# Patient Record
Sex: Male | Born: 1940
Health system: Southern US, Community
[De-identification: ages and names within clinical notes are randomized; demographics above are authoritative.]

## PROBLEM LIST (undated history)

## (undated) DIAGNOSIS — C61 Malignant neoplasm of prostate: Secondary | ICD-10-CM

## (undated) DIAGNOSIS — T4145XA Adverse effect of unspecified anesthetic, initial encounter: Secondary | ICD-10-CM

## (undated) DIAGNOSIS — T8859XA Other complications of anesthesia, initial encounter: Secondary | ICD-10-CM

## (undated) DIAGNOSIS — I4891 Unspecified atrial fibrillation: Secondary | ICD-10-CM

## (undated) DIAGNOSIS — M1990 Unspecified osteoarthritis, unspecified site: Secondary | ICD-10-CM

## (undated) DIAGNOSIS — Z8719 Personal history of other diseases of the digestive system: Secondary | ICD-10-CM

## (undated) DIAGNOSIS — H9190 Unspecified hearing loss, unspecified ear: Secondary | ICD-10-CM

## (undated) DIAGNOSIS — F329 Major depressive disorder, single episode, unspecified: Secondary | ICD-10-CM

## (undated) DIAGNOSIS — F32A Depression, unspecified: Secondary | ICD-10-CM

## (undated) DIAGNOSIS — G2581 Restless legs syndrome: Secondary | ICD-10-CM

## (undated) DIAGNOSIS — M810 Age-related osteoporosis without current pathological fracture: Secondary | ICD-10-CM

## (undated) DIAGNOSIS — F0390 Unspecified dementia without behavioral disturbance: Secondary | ICD-10-CM

## (undated) DIAGNOSIS — L80 Vitiligo: Secondary | ICD-10-CM

## (undated) DIAGNOSIS — E079 Disorder of thyroid, unspecified: Secondary | ICD-10-CM

## (undated) HISTORY — DX: Age-related osteoporosis without current pathological fracture: M81.0

## (undated) HISTORY — PX: COLONOSCOPY: SHX174

## (undated) HISTORY — PX: BACK SURGERY: SHX140

## (undated) HISTORY — PX: JOINT REPLACEMENT: SHX530

## (undated) HISTORY — PX: TRANSURETHRAL RESECTION OF PROSTATE: SHX73

---

## 2011-07-10 HISTORY — PX: PROSTATE BIOPSY: SHX241

## 2011-10-27 HISTORY — PX: LAMINECTOMY: SHX219

## 2012-02-29 HISTORY — PX: TOTAL HIP ARTHROPLASTY: SHX124

## 2014-06-01 DIAGNOSIS — Z01818 Encounter for other preprocedural examination: Secondary | ICD-10-CM | POA: Diagnosis not present

## 2014-06-03 DIAGNOSIS — Z01810 Encounter for preprocedural cardiovascular examination: Secondary | ICD-10-CM | POA: Diagnosis not present

## 2014-06-03 DIAGNOSIS — I491 Atrial premature depolarization: Secondary | ICD-10-CM | POA: Diagnosis not present

## 2014-06-09 DIAGNOSIS — Z8546 Personal history of malignant neoplasm of prostate: Secondary | ICD-10-CM | POA: Diagnosis not present

## 2014-06-09 DIAGNOSIS — I480 Paroxysmal atrial fibrillation: Secondary | ICD-10-CM | POA: Diagnosis not present

## 2014-06-09 DIAGNOSIS — K4090 Unilateral inguinal hernia, without obstruction or gangrene, not specified as recurrent: Secondary | ICD-10-CM | POA: Diagnosis not present

## 2014-07-14 DIAGNOSIS — I70213 Atherosclerosis of native arteries of extremities with intermittent claudication, bilateral legs: Secondary | ICD-10-CM | POA: Diagnosis not present

## 2014-07-14 DIAGNOSIS — L608 Other nail disorders: Secondary | ICD-10-CM | POA: Diagnosis not present

## 2014-07-14 DIAGNOSIS — B351 Tinea unguium: Secondary | ICD-10-CM | POA: Diagnosis not present

## 2014-07-14 DIAGNOSIS — L851 Acquired keratosis [keratoderma] palmaris et plantaris: Secondary | ICD-10-CM | POA: Diagnosis not present

## 2014-09-22 DIAGNOSIS — L608 Other nail disorders: Secondary | ICD-10-CM | POA: Diagnosis not present

## 2014-09-22 DIAGNOSIS — I70213 Atherosclerosis of native arteries of extremities with intermittent claudication, bilateral legs: Secondary | ICD-10-CM | POA: Diagnosis not present

## 2014-09-22 DIAGNOSIS — L851 Acquired keratosis [keratoderma] palmaris et plantaris: Secondary | ICD-10-CM | POA: Diagnosis not present

## 2014-09-22 DIAGNOSIS — B351 Tinea unguium: Secondary | ICD-10-CM | POA: Diagnosis not present

## 2014-09-27 DIAGNOSIS — R05 Cough: Secondary | ICD-10-CM | POA: Diagnosis not present

## 2014-10-28 DIAGNOSIS — E039 Hypothyroidism, unspecified: Secondary | ICD-10-CM | POA: Diagnosis not present

## 2014-10-28 DIAGNOSIS — T63441A Toxic effect of venom of bees, accidental (unintentional), initial encounter: Secondary | ICD-10-CM | POA: Diagnosis not present

## 2014-10-28 DIAGNOSIS — Z96642 Presence of left artificial hip joint: Secondary | ICD-10-CM | POA: Diagnosis not present

## 2014-12-01 DIAGNOSIS — I70213 Atherosclerosis of native arteries of extremities with intermittent claudication, bilateral legs: Secondary | ICD-10-CM | POA: Diagnosis not present

## 2014-12-01 DIAGNOSIS — L608 Other nail disorders: Secondary | ICD-10-CM | POA: Diagnosis not present

## 2014-12-01 DIAGNOSIS — B351 Tinea unguium: Secondary | ICD-10-CM | POA: Diagnosis not present

## 2014-12-01 DIAGNOSIS — L851 Acquired keratosis [keratoderma] palmaris et plantaris: Secondary | ICD-10-CM | POA: Diagnosis not present

## 2014-12-24 DIAGNOSIS — H43393 Other vitreous opacities, bilateral: Secondary | ICD-10-CM | POA: Diagnosis not present

## 2015-01-28 DIAGNOSIS — R972 Elevated prostate specific antigen [PSA]: Secondary | ICD-10-CM | POA: Diagnosis not present

## 2015-01-28 DIAGNOSIS — R351 Nocturia: Secondary | ICD-10-CM | POA: Diagnosis not present

## 2015-01-28 DIAGNOSIS — N318 Other neuromuscular dysfunction of bladder: Secondary | ICD-10-CM | POA: Diagnosis not present

## 2015-01-28 DIAGNOSIS — C61 Malignant neoplasm of prostate: Secondary | ICD-10-CM | POA: Diagnosis not present

## 2015-02-14 DIAGNOSIS — B351 Tinea unguium: Secondary | ICD-10-CM | POA: Diagnosis not present

## 2015-02-14 DIAGNOSIS — L608 Other nail disorders: Secondary | ICD-10-CM | POA: Diagnosis not present

## 2015-02-14 DIAGNOSIS — L851 Acquired keratosis [keratoderma] palmaris et plantaris: Secondary | ICD-10-CM | POA: Diagnosis not present

## 2015-02-14 DIAGNOSIS — I70213 Atherosclerosis of native arteries of extremities with intermittent claudication, bilateral legs: Secondary | ICD-10-CM | POA: Diagnosis not present

## 2015-02-28 DIAGNOSIS — R5383 Other fatigue: Secondary | ICD-10-CM | POA: Diagnosis not present

## 2015-02-28 DIAGNOSIS — E039 Hypothyroidism, unspecified: Secondary | ICD-10-CM | POA: Diagnosis not present

## 2015-02-28 DIAGNOSIS — L80 Vitiligo: Secondary | ICD-10-CM | POA: Diagnosis not present

## 2015-02-28 DIAGNOSIS — Z23 Encounter for immunization: Secondary | ICD-10-CM | POA: Diagnosis not present

## 2015-02-28 DIAGNOSIS — G933 Postviral fatigue syndrome: Secondary | ICD-10-CM | POA: Diagnosis not present

## 2015-02-28 DIAGNOSIS — I480 Paroxysmal atrial fibrillation: Secondary | ICD-10-CM | POA: Diagnosis not present

## 2015-03-01 DIAGNOSIS — E049 Nontoxic goiter, unspecified: Secondary | ICD-10-CM | POA: Diagnosis not present

## 2015-03-16 DIAGNOSIS — Z23 Encounter for immunization: Secondary | ICD-10-CM | POA: Diagnosis not present

## 2015-04-18 DIAGNOSIS — B351 Tinea unguium: Secondary | ICD-10-CM | POA: Diagnosis not present

## 2015-04-18 DIAGNOSIS — L608 Other nail disorders: Secondary | ICD-10-CM | POA: Diagnosis not present

## 2015-04-18 DIAGNOSIS — I70213 Atherosclerosis of native arteries of extremities with intermittent claudication, bilateral legs: Secondary | ICD-10-CM | POA: Diagnosis not present

## 2015-04-18 DIAGNOSIS — L851 Acquired keratosis [keratoderma] palmaris et plantaris: Secondary | ICD-10-CM | POA: Diagnosis not present

## 2015-06-02 DIAGNOSIS — Z96641 Presence of right artificial hip joint: Secondary | ICD-10-CM | POA: Diagnosis not present

## 2015-06-02 DIAGNOSIS — M1611 Unilateral primary osteoarthritis, right hip: Secondary | ICD-10-CM | POA: Diagnosis not present

## 2015-06-06 DIAGNOSIS — M1611 Unilateral primary osteoarthritis, right hip: Secondary | ICD-10-CM | POA: Diagnosis not present

## 2015-06-08 DIAGNOSIS — M1611 Unilateral primary osteoarthritis, right hip: Secondary | ICD-10-CM | POA: Diagnosis not present

## 2015-06-10 DIAGNOSIS — M1611 Unilateral primary osteoarthritis, right hip: Secondary | ICD-10-CM | POA: Diagnosis not present

## 2015-06-13 DIAGNOSIS — M1611 Unilateral primary osteoarthritis, right hip: Secondary | ICD-10-CM | POA: Diagnosis not present

## 2015-06-15 DIAGNOSIS — M1611 Unilateral primary osteoarthritis, right hip: Secondary | ICD-10-CM | POA: Diagnosis not present

## 2015-06-17 DIAGNOSIS — M1611 Unilateral primary osteoarthritis, right hip: Secondary | ICD-10-CM | POA: Diagnosis not present

## 2015-06-20 DIAGNOSIS — L608 Other nail disorders: Secondary | ICD-10-CM | POA: Diagnosis not present

## 2015-06-20 DIAGNOSIS — M1611 Unilateral primary osteoarthritis, right hip: Secondary | ICD-10-CM | POA: Diagnosis not present

## 2015-06-20 DIAGNOSIS — L851 Acquired keratosis [keratoderma] palmaris et plantaris: Secondary | ICD-10-CM | POA: Diagnosis not present

## 2015-06-20 DIAGNOSIS — I70213 Atherosclerosis of native arteries of extremities with intermittent claudication, bilateral legs: Secondary | ICD-10-CM | POA: Diagnosis not present

## 2015-06-20 DIAGNOSIS — B351 Tinea unguium: Secondary | ICD-10-CM | POA: Diagnosis not present

## 2015-06-22 DIAGNOSIS — M1611 Unilateral primary osteoarthritis, right hip: Secondary | ICD-10-CM | POA: Diagnosis not present

## 2015-06-27 DIAGNOSIS — M1611 Unilateral primary osteoarthritis, right hip: Secondary | ICD-10-CM | POA: Diagnosis not present

## 2015-06-29 DIAGNOSIS — M1611 Unilateral primary osteoarthritis, right hip: Secondary | ICD-10-CM | POA: Diagnosis not present

## 2015-07-01 DIAGNOSIS — M1611 Unilateral primary osteoarthritis, right hip: Secondary | ICD-10-CM | POA: Diagnosis not present

## 2015-07-04 DIAGNOSIS — M1611 Unilateral primary osteoarthritis, right hip: Secondary | ICD-10-CM | POA: Diagnosis not present

## 2015-07-05 DIAGNOSIS — M1611 Unilateral primary osteoarthritis, right hip: Secondary | ICD-10-CM | POA: Diagnosis not present

## 2015-07-05 DIAGNOSIS — M65311 Trigger thumb, right thumb: Secondary | ICD-10-CM | POA: Diagnosis not present

## 2015-08-12 DIAGNOSIS — C61 Malignant neoplasm of prostate: Secondary | ICD-10-CM | POA: Diagnosis not present

## 2015-08-12 DIAGNOSIS — R972 Elevated prostate specific antigen [PSA]: Secondary | ICD-10-CM | POA: Diagnosis not present

## 2015-08-22 DIAGNOSIS — B351 Tinea unguium: Secondary | ICD-10-CM | POA: Diagnosis not present

## 2015-08-22 DIAGNOSIS — L608 Other nail disorders: Secondary | ICD-10-CM | POA: Diagnosis not present

## 2015-08-22 DIAGNOSIS — I70213 Atherosclerosis of native arteries of extremities with intermittent claudication, bilateral legs: Secondary | ICD-10-CM | POA: Diagnosis not present

## 2015-08-22 DIAGNOSIS — L851 Acquired keratosis [keratoderma] palmaris et plantaris: Secondary | ICD-10-CM | POA: Diagnosis not present

## 2015-09-15 DIAGNOSIS — H43393 Other vitreous opacities, bilateral: Secondary | ICD-10-CM | POA: Diagnosis not present

## 2015-09-19 DIAGNOSIS — N318 Other neuromuscular dysfunction of bladder: Secondary | ICD-10-CM | POA: Diagnosis not present

## 2015-09-19 DIAGNOSIS — N411 Chronic prostatitis: Secondary | ICD-10-CM | POA: Diagnosis not present

## 2015-09-19 DIAGNOSIS — R972 Elevated prostate specific antigen [PSA]: Secondary | ICD-10-CM | POA: Diagnosis not present

## 2015-09-19 DIAGNOSIS — C61 Malignant neoplasm of prostate: Secondary | ICD-10-CM | POA: Diagnosis not present

## 2015-09-19 DIAGNOSIS — R351 Nocturia: Secondary | ICD-10-CM | POA: Diagnosis not present

## 2015-09-19 DIAGNOSIS — N410 Acute prostatitis: Secondary | ICD-10-CM | POA: Diagnosis not present

## 2015-09-22 HISTORY — PX: PROSTATE BIOPSY: SHX241

## 2015-09-28 DIAGNOSIS — C61 Malignant neoplasm of prostate: Secondary | ICD-10-CM | POA: Diagnosis not present

## 2015-09-28 DIAGNOSIS — N318 Other neuromuscular dysfunction of bladder: Secondary | ICD-10-CM | POA: Diagnosis not present

## 2015-09-28 DIAGNOSIS — R351 Nocturia: Secondary | ICD-10-CM | POA: Diagnosis not present

## 2015-09-28 DIAGNOSIS — R972 Elevated prostate specific antigen [PSA]: Secondary | ICD-10-CM | POA: Diagnosis not present

## 2015-11-07 DIAGNOSIS — B351 Tinea unguium: Secondary | ICD-10-CM | POA: Diagnosis not present

## 2015-11-07 DIAGNOSIS — I70213 Atherosclerosis of native arteries of extremities with intermittent claudication, bilateral legs: Secondary | ICD-10-CM | POA: Diagnosis not present

## 2015-11-07 DIAGNOSIS — L608 Other nail disorders: Secondary | ICD-10-CM | POA: Diagnosis not present

## 2015-11-07 DIAGNOSIS — L851 Acquired keratosis [keratoderma] palmaris et plantaris: Secondary | ICD-10-CM | POA: Diagnosis not present

## 2015-11-09 DIAGNOSIS — H903 Sensorineural hearing loss, bilateral: Secondary | ICD-10-CM | POA: Diagnosis not present

## 2015-11-09 DIAGNOSIS — C61 Malignant neoplasm of prostate: Secondary | ICD-10-CM | POA: Diagnosis not present

## 2015-11-16 DIAGNOSIS — N400 Enlarged prostate without lower urinary tract symptoms: Secondary | ICD-10-CM | POA: Diagnosis not present

## 2015-11-21 DIAGNOSIS — R3915 Urgency of urination: Secondary | ICD-10-CM | POA: Diagnosis not present

## 2015-11-21 DIAGNOSIS — Z791 Long term (current) use of non-steroidal anti-inflammatories (NSAID): Secondary | ICD-10-CM | POA: Diagnosis not present

## 2015-11-21 DIAGNOSIS — N401 Enlarged prostate with lower urinary tract symptoms: Secondary | ICD-10-CM | POA: Diagnosis not present

## 2015-11-21 DIAGNOSIS — R3912 Poor urinary stream: Secondary | ICD-10-CM | POA: Diagnosis not present

## 2015-11-21 DIAGNOSIS — N320 Bladder-neck obstruction: Secondary | ICD-10-CM | POA: Diagnosis not present

## 2015-11-21 DIAGNOSIS — R339 Retention of urine, unspecified: Secondary | ICD-10-CM | POA: Diagnosis not present

## 2015-11-21 DIAGNOSIS — R351 Nocturia: Secondary | ICD-10-CM | POA: Diagnosis not present

## 2015-11-21 DIAGNOSIS — Z87891 Personal history of nicotine dependence: Secondary | ICD-10-CM | POA: Diagnosis not present

## 2015-11-21 DIAGNOSIS — Z79899 Other long term (current) drug therapy: Secondary | ICD-10-CM | POA: Diagnosis not present

## 2015-11-21 DIAGNOSIS — Z8679 Personal history of other diseases of the circulatory system: Secondary | ICD-10-CM | POA: Diagnosis not present

## 2015-11-21 DIAGNOSIS — E039 Hypothyroidism, unspecified: Secondary | ICD-10-CM | POA: Diagnosis not present

## 2015-11-21 DIAGNOSIS — C61 Malignant neoplasm of prostate: Secondary | ICD-10-CM | POA: Diagnosis not present

## 2015-11-22 DIAGNOSIS — C61 Malignant neoplasm of prostate: Secondary | ICD-10-CM | POA: Diagnosis not present

## 2015-11-24 ENCOUNTER — Encounter: Payer: Self-pay | Admitting: Radiation Oncology

## 2015-12-06 ENCOUNTER — Encounter: Payer: Self-pay | Admitting: Radiation Oncology

## 2015-12-06 NOTE — Progress Notes (Signed)
GU Location of Tumor / Histology: low risk prostate cancer gleason 3+3, 2 of 12 cores, PSA 4.45, diagnosed 07/10/11, on active surveillance, now with progression to low intermediate risk prostate cancer, gleason 4+3, 2 of 12 cores, PSA 5.5  If Prostate Cancer, Gleason Score is (4 + 3) and PSA is (5.5)    Past/Anticipated interventions by urology, if any: TURP in 2013, prostate biopsy x2, Lupron 22.5 given 6/26, Casodex script given 6/26  Past/Anticipated interventions by medical oncology, if any: no  Weight changes, if any: no  Bowel/Bladder complaints, if any: Reports at baseline nocturia x 2 and some urinary frequency. Denies urgency, dysuria, or incomplete emptying. Denies hematuria, leakage or incontinence. Explains he and his wife of 35 years have sex once per week and his semen has drops of dark/old blood as well as his initial void.Denies bowel complaints.   Nausea/Vomiting, if any: no  Pain issues, if any: Reports occasional pain related to affects of arthritis. Reports occasional right hip pain that has been present since a fall he took from a ladder in 2000.   SAFETY ISSUES:  Prior radiation? no  Pacemaker/ICD? no  Possible current pregnancy? no  Is the patient on methotrexate? no  Current Complaints / other details:  75 year old male. Recently move from [place] to [place]. NKDA. PROSTATE VOLUME: 45.79 CC

## 2015-12-07 ENCOUNTER — Ambulatory Visit
Admission: RE | Admit: 2015-12-07 | Discharge: 2015-12-07 | Disposition: A | Discharge status: Home / Self Care | Payer: Medicare Other | Source: Ambulatory Visit | Attending: Radiation Oncology | Admitting: Radiation Oncology

## 2015-12-07 ENCOUNTER — Telehealth: Payer: Self-pay | Admitting: *Deleted

## 2015-12-07 ENCOUNTER — Encounter: Payer: Self-pay | Admitting: Radiation Oncology

## 2015-12-07 VITALS — BP 119/68 | HR 61 | Resp 16 | Ht 66.0 in | Wt 167.4 lb

## 2015-12-07 DIAGNOSIS — I480 Paroxysmal atrial fibrillation: Secondary | ICD-10-CM | POA: Insufficient documentation

## 2015-12-07 DIAGNOSIS — Z87891 Personal history of nicotine dependence: Secondary | ICD-10-CM | POA: Diagnosis not present

## 2015-12-07 DIAGNOSIS — C61 Malignant neoplasm of prostate: Secondary | ICD-10-CM

## 2015-12-07 DIAGNOSIS — I4891 Unspecified atrial fibrillation: Secondary | ICD-10-CM | POA: Diagnosis not present

## 2015-12-07 DIAGNOSIS — E039 Hypothyroidism, unspecified: Secondary | ICD-10-CM | POA: Insufficient documentation

## 2015-12-07 DIAGNOSIS — Z510 Encounter for antineoplastic radiation therapy: Secondary | ICD-10-CM | POA: Diagnosis not present

## 2015-12-07 HISTORY — DX: Unspecified osteoarthritis, unspecified site: M19.90

## 2015-12-07 HISTORY — DX: Disorder of thyroid, unspecified: E07.9

## 2015-12-07 HISTORY — DX: Vitiligo: L80

## 2015-12-07 HISTORY — DX: Malignant neoplasm of prostate: C61

## 2015-12-07 HISTORY — DX: Unspecified atrial fibrillation: I48.91

## 2015-12-07 NOTE — Progress Notes (Signed)
See progress note under physician encounter. 

## 2015-12-07 NOTE — Telephone Encounter (Signed)
CALLED PATIENT TO INFORM OF APPT. WITH DR. Annie Main DAHLSTEDT ON 01/20/16 - ARRIVAL TIME - 8:30 AM , THIS APPT. IS FOR CONSULTATION- AN APPT. FOR GOLD MARKERS WILL BE MADE AFTER HE IS CONSULTED WITH DR. DAHLSTEDT, SPOKE WITH PATIENT'S WIFE AND SHE IS AWARE OF THIS APPT.

## 2015-12-07 NOTE — Progress Notes (Signed)
Radiation Oncology         [phone] ________________________________  Initial Outpatient Consultation  Name: Jesse [name] MRN: [id number]  Date: 12/07/2015  DOB: [birth year]  CC:No primary care provider on file.  Lyndon Code, MD   REFERRING PHYSICIAN: Lyndon Code, MD  DIAGNOSIS: 75 y.o. gentleman with stage T2a adenocarcinoma of the prostate with a Gleason's score of 4+3 and a PSA of 5.5    ICD-9-CM ICD-10-CM   1. Malignant neoplasm of prostate ([place]) 185 C61   2. Atrial fibrillation, unspecified type ([place]) 427.31 I48.91   3. Hypothyroidism, unspecified hypothyroidism type 244.9 E03.9     HISTORY OF PRESENT ILLNESS::Jesse [name] is a 75 y.o. gentleman.  He was noted to have a rising PSA by his PCP in [place].  08/2010: 2.6 11/02/10: 2.6 04/30/11: 2.9 06/07/11 PSA 3.8 06/12/11 PSA 4.45  Accordingly, he was referred for evaluation in urology by Dr. Mariana Arn of [facility] in [place]. The patient proceeded to transrectal ultrasound with 12 biopsies of the prostate on 07/10/2011.  The prostate volume measured 30 cc.  Out of 12 core biopsies, 2 were positive.  The maximum Gleason score was 3+3, and this was seen in right apex and left apex. The biopsy was complicated by urinary retention. The patient elected for active surveillance.  In 10/2011, the patient had a cervical and lumbar laminectomy. The patient developed urinary retention after the surgery.  In 11/2011, the patient was initiated on Uroxatral and Rapaflo for urgency, weak stream, incomplete voiding, and nocturia with minimal improvement. VUDS testing revealed the patient was obstructed.  On 01/03/12, the patient underwent TURP with significant improvement in urinary symptoms. Pathology was negative for carcinoma.  07/28/12 PSA 1.7 01/29/13 PSA 2.3 09/29/13 PSA 2.6 03/30/14 PSA 2.3 01/28/15 PSA 4.3 08/12/15 PSA 5.5  A 12 core prostate biopsy was repeated on 09/19/15, by Dr. Lovena Le at [facility] in [place], and this  revealed Gleason score 3+4 in the right lateral base and 4+3 in the left lateral mid. The prostate size was 45.79 cc. The prostate biopsy was complicated by acute retention requiring a temporary catheter.  The patient moved from [place] to [place], [place]. The patient was evaluated by Dr. Alto Denver at [facility] on 11/16/15 who noted a focal left base nodule on rectal exam. The patient was also referred to Dr. Ricky Ala and Dr. Bridgett Larsson at [facility].  The patient was initiated with androgen depravation therapy on 11/01/15 with a shot of Lupron 22.5 and a six month supply of Casodex was prescribed the same day as well.  The patient reviewed the biopsy results with his urologist and he has kindly been referred today for discussion of potential radiation treatment options. The patient was also present during the encounter.  PREVIOUS RADIATION THERAPY: No  PAST MEDICAL HISTORY:  has a past medical history of Prostate cancer ([place]); Thyroid disease; Arthritis; Vitiligo; and Atrial fibrillation ([place]).    PAST SURGICAL HISTORY: Past Surgical History  Procedure Laterality Date  . Prostate biopsy  07/10/2011  . Laminectomy  10/2011    cervical and lumbar laminectomy  . Prostate biopsy  09/22/15    FAMILY HISTORY: family history is negative for Cancer.  SOCIAL HISTORY:  reports that he quit smoking about 41 years ago. His smoking use included Cigarettes. He has never used smokeless tobacco. He reports that he drinks alcohol. He reports that he does not use illicit drugs.  ALLERGIES: Review of patient's allergies indicates no known allergies.  MEDICATIONS:  Current Outpatient  Prescriptions  Medication Sig Dispense Refill  . acetaminophen (TYLENOL) 500 MG tablet Take 500 mg by mouth.    . bicalutamide (CASODEX) 50 MG tablet Take 50 mg by mouth.    Marland Kitchen glucosamine-chondroitin 500-400 MG tablet Take 1 tablet by mouth 3 (three) times daily.    Marland Kitchen levothyroxine (SYNTHROID, LEVOTHROID) 50 MCG tablet Take by mouth.     No  current facility-administered medications for this encounter.    REVIEW OF SYSTEMS:  A 15 point review of systems is documented in the electronic medical record. This was obtained by the nursing staff. However, I reviewed this with the patient to discuss relevant findings and make appropriate changes.  Pertinent items are noted in HPI..  The patient completed an IPSS and IIEF questionnaire.  His IPSS score was 4 indicating mild urinary outflow obstructive symptoms.  He indicated that his erectile function is able to complete sexual activity on most attempts.   PHYSICAL EXAM: This patient is in no acute distress.  He is alert and oriented.   height is 5\' 6"  (1.676 m) and weight is 167 lb 6.4 oz (75.932 kg). His blood pressure is 119/68 and his pulse is 61. His respiration is 16 and oxygen saturation is 100%.  He exhibits no respiratory distress or labored breathing.  He appears neurologically intact.  His mood is pleasant.  His affect is appropriate.  Please note the digital rectal exam findings described above.  KPS = 100  100 - Normal; no complaints; no evidence of disease. 90   - Able to carry on normal activity; minor signs or symptoms of disease. 80   - Normal activity with effort; some signs or symptoms of disease. 65   - Cares for self; unable to carry on normal activity or to do active work. 60   - Requires occasional assistance, but is able to care for most of his personal needs. 50   - Requires considerable assistance and frequent medical care. 10   - Disabled; requires special care and assistance. 36   - Severely disabled; hospital admission is indicated although death not imminent. 62   - Very sick; hospital admission necessary; active supportive treatment necessary. 10   - Moribund; fatal processes progressing rapidly. 0     - Dead  Karnofsky DA, Abelmann WH, Craver LS and Burchenal JH [phone]) The use of the nitrogen mustards in the palliative treatment of carcinoma: with particular  reference to bronchogenic carcinoma Cancer 1 [phone]   LABORATORY DATA:  No results found for: WBC, HGB, HCT, MCV, PLT No results found for: NA, K, CL, CO2 No results found for: ALT, AST, GGT, ALKPHOS, BILITOT   RADIOGRAPHY: No results found.    IMPRESSION: This gentleman is a 75 y.o. gentleman with stage T2a adenocarcinoma of the prostate with a Gleason's score of 4+3 and a PSA of 5.5.  His T-Stage, Gleason's Score, and PSA put him into the intermediate risk group.  Accordingly he is eligible for a variety of potential treatment options including androgen depravation therapy with IMRT.  PLAN: Today I reviewed the findings and workup thus far.  We discussed the natural history of prostate cancer.  We reviewed the the implications of T-stage, Gleason's Score, and PSA on decision-making and outcomes in prostate cancer.  We discussed radiation treatment in the management of prostate cancer with regard to the logistics and delivery of external beam radiation treatment as well as the logistics and delivery of prostate brachytherapy.  We compared and contrasted each  of these approaches and also compared these against prostatectomy.  The patient expressed interest in external beam radiotherapy.  I filled out a patient counseling form for him with relevant treatment diagrams and we retained a copy for our records.   The patient would like to proceed with prostate IMRT.  I will share my findings with Dr. Bridgett Larsson and move forward with scheduling placement of three gold fiducial markers at [facility] into the prostate to proceed with IMRT in the near future.     I enjoyed meeting with him today, and will look forward to participating in the care of this very nice gentleman.   I spent time face to face with the patient and more than 50% of that time was spent in counseling and/or coordination of care.   ------------------------------------------------  Sheral Apley. Tammi Klippel, M.D.  This document serves as a record  of services personally performed by Tyler Pita, MD. It was created on his behalf by Darcus Austin, a trained medical scribe. The creation of this record is based on the scribe's personal observations and the provider's statements to them. This document has been checked and approved by the attending provider.

## 2015-12-21 DIAGNOSIS — R350 Frequency of micturition: Secondary | ICD-10-CM | POA: Diagnosis not present

## 2015-12-21 DIAGNOSIS — R972 Elevated prostate specific antigen [PSA]: Secondary | ICD-10-CM | POA: Diagnosis not present

## 2015-12-21 DIAGNOSIS — C61 Malignant neoplasm of prostate: Secondary | ICD-10-CM | POA: Diagnosis not present

## 2015-12-21 DIAGNOSIS — N318 Other neuromuscular dysfunction of bladder: Secondary | ICD-10-CM | POA: Diagnosis not present

## 2015-12-22 ENCOUNTER — Telehealth: Payer: Self-pay | Admitting: Radiation Oncology

## 2015-12-22 NOTE — Telephone Encounter (Signed)
Return message left by patient's wife. She reports her husband experiencing abdominal cramping following sex. Encouraged her to contact Dr. Bridgett Larsson to determine if this could be related to Casodex. She verbalized understanding and expressed appreciation for the call. Also, she reports the patient is scheduled to see Dr. Diona Fanti on August 25th to discuss fiducial marker placement.

## 2016-01-09 DIAGNOSIS — L608 Other nail disorders: Secondary | ICD-10-CM | POA: Diagnosis not present

## 2016-01-09 DIAGNOSIS — I70213 Atherosclerosis of native arteries of extremities with intermittent claudication, bilateral legs: Secondary | ICD-10-CM | POA: Diagnosis not present

## 2016-01-09 DIAGNOSIS — L851 Acquired keratosis [keratoderma] palmaris et plantaris: Secondary | ICD-10-CM | POA: Diagnosis not present

## 2016-01-09 DIAGNOSIS — B351 Tinea unguium: Secondary | ICD-10-CM | POA: Diagnosis not present

## 2016-01-13 ENCOUNTER — Telehealth: Payer: Self-pay | Admitting: *Deleted

## 2016-01-13 NOTE — Telephone Encounter (Signed)
RETURNED PATIENT'S PHONE CALL, LVM FOR A RETURN CALL 

## 2016-01-23 DIAGNOSIS — C61 Malignant neoplasm of prostate: Secondary | ICD-10-CM | POA: Diagnosis not present

## 2016-01-27 ENCOUNTER — Telehealth: Payer: Self-pay | Admitting: Radiation Oncology

## 2016-01-27 ENCOUNTER — Telehealth: Payer: Self-pay | Admitting: *Deleted

## 2016-01-27 NOTE — Telephone Encounter (Signed)
RECEIVED PHONE CALL FROM THIS PATIENT'S WIFE, - [name] Maute  AND [facility] HAS CALLED THEM WITH AN APPT. FOR GOLD SEEDS ON 02-03-16 , AND I SCHEDULED HIS SIM FOR 02-10-16 @ 10 AM, INFORMED THIS PATIENT'S WIFE AND SHE IS AWARE OF THIS SIM APPT. AND IS GOOD WITH IT.

## 2016-01-27 NOTE — Telephone Encounter (Signed)
Patric Dykes, RN reports that last night she received a message from the patient's wife concern that her husband's gold seed placement date isn't until September 25. This RN requested Enid Derry reach out to [employer] office to inquire. Shortly after Enid Derry spoke with staff at [facility] the patient's wife called back to say she received a call for [facility] with a date of September 8 for gold seed placement. Enid Derry reports that she arranged a CT/Sim appointment.

## 2016-02-03 DIAGNOSIS — C61 Malignant neoplasm of prostate: Secondary | ICD-10-CM | POA: Diagnosis not present

## 2016-02-10 ENCOUNTER — Ambulatory Visit
Admission: RE | Admit: 2016-02-10 | Discharge: 2016-02-10 | Disposition: A | Discharge status: Home / Self Care | Payer: Medicare Other | Source: Ambulatory Visit | Attending: Radiation Oncology | Admitting: Radiation Oncology

## 2016-02-10 DIAGNOSIS — C61 Malignant neoplasm of prostate: Secondary | ICD-10-CM | POA: Diagnosis not present

## 2016-02-10 DIAGNOSIS — I4891 Unspecified atrial fibrillation: Secondary | ICD-10-CM | POA: Diagnosis not present

## 2016-02-10 DIAGNOSIS — Z510 Encounter for antineoplastic radiation therapy: Secondary | ICD-10-CM | POA: Diagnosis not present

## 2016-02-10 DIAGNOSIS — Z87891 Personal history of nicotine dependence: Secondary | ICD-10-CM | POA: Diagnosis not present

## 2016-02-10 DIAGNOSIS — E039 Hypothyroidism, unspecified: Secondary | ICD-10-CM | POA: Diagnosis not present

## 2016-02-10 NOTE — Progress Notes (Signed)
  Radiation Oncology         [phone] ________________________________  Name: Jesse [name] MRN: [id number]  Date: 02/10/2016  DOB: 1940/09/18  SIMULATION AND TREATMENT PLANNING NOTE    ICD-9-CM ICD-10-CM   1. Malignant neoplasm of prostate ([place]) 185 C61     DIAGNOSIS:  75 y.o. gentleman with stage T2a adenocarcinoma of the prostate with a Gleason's score of 4+3 and a PSA of 5.5  NARRATIVE:  The patient was brought to the [facility].  Identity was confirmed.  All relevant records and images related to the planned course of therapy were reviewed.  The patient freely provided informed written consent to proceed with treatment after reviewing the details related to the planned course of therapy. The consent form was witnessed and verified by the simulation staff.  Then, the patient was set-up in a stable reproducible supine position for radiation therapy.  A vacuum lock pillow device was custom fabricated to position his legs in a reproducible immobilized position.  Then, I performed a urethrogram under sterile conditions to identify the prostatic apex.  CT images were obtained.  Surface markings were placed.  The CT images were loaded into the planning software.  Then the prostate target and avoidance structures including the rectum, bladder, bowel and hips were contoured.  Treatment planning then occurred.  The radiation prescription was entered and confirmed.  A total of one complex treatment devices were fabricated. I have requested : Intensity Modulated Radiotherapy (IMRT) is medically necessary for this case for the following reason:  Rectal sparing.Marland Kitchen  PLAN:  The patient will receive 78 Gy in 40 fractions.  ________________________________  Sheral Apley Tammi Klippel, M.D.  This document serves as a record of services personally performed by Tyler Pita, MD. It was created on his behalf by Darcus Austin, a trained medical scribe. The creation of this record is based on the  scribe's personal observations and the provider's statements to them. This document has been checked and approved by the attending provider.

## 2016-02-20 DIAGNOSIS — Z510 Encounter for antineoplastic radiation therapy: Secondary | ICD-10-CM | POA: Diagnosis not present

## 2016-02-20 DIAGNOSIS — I4891 Unspecified atrial fibrillation: Secondary | ICD-10-CM | POA: Diagnosis not present

## 2016-02-20 DIAGNOSIS — Z87891 Personal history of nicotine dependence: Secondary | ICD-10-CM | POA: Diagnosis not present

## 2016-02-20 DIAGNOSIS — C61 Malignant neoplasm of prostate: Secondary | ICD-10-CM | POA: Diagnosis not present

## 2016-02-20 DIAGNOSIS — E039 Hypothyroidism, unspecified: Secondary | ICD-10-CM | POA: Diagnosis not present

## 2016-02-21 ENCOUNTER — Ambulatory Visit
Admission: RE | Admit: 2016-02-21 | Discharge: 2016-02-21 | Disposition: A | Discharge status: Home / Self Care | Payer: Medicare Other | Source: Ambulatory Visit | Attending: Radiation Oncology | Admitting: Radiation Oncology

## 2016-02-21 DIAGNOSIS — Z510 Encounter for antineoplastic radiation therapy: Secondary | ICD-10-CM | POA: Diagnosis not present

## 2016-02-21 DIAGNOSIS — I4891 Unspecified atrial fibrillation: Secondary | ICD-10-CM | POA: Diagnosis not present

## 2016-02-21 DIAGNOSIS — E039 Hypothyroidism, unspecified: Secondary | ICD-10-CM | POA: Diagnosis not present

## 2016-02-21 DIAGNOSIS — Z87891 Personal history of nicotine dependence: Secondary | ICD-10-CM | POA: Diagnosis not present

## 2016-02-21 DIAGNOSIS — C61 Malignant neoplasm of prostate: Secondary | ICD-10-CM | POA: Diagnosis not present

## 2016-02-22 ENCOUNTER — Ambulatory Visit
Admission: RE | Admit: 2016-02-22 | Discharge: 2016-02-22 | Disposition: A | Discharge status: Home / Self Care | Payer: Medicare Other | Source: Ambulatory Visit | Attending: Radiation Oncology | Admitting: Radiation Oncology

## 2016-02-22 ENCOUNTER — Ambulatory Visit: Payer: Medicare Other

## 2016-02-22 DIAGNOSIS — I4891 Unspecified atrial fibrillation: Secondary | ICD-10-CM | POA: Diagnosis not present

## 2016-02-22 DIAGNOSIS — Z510 Encounter for antineoplastic radiation therapy: Secondary | ICD-10-CM | POA: Diagnosis not present

## 2016-02-22 DIAGNOSIS — Z87891 Personal history of nicotine dependence: Secondary | ICD-10-CM | POA: Diagnosis not present

## 2016-02-22 DIAGNOSIS — C61 Malignant neoplasm of prostate: Secondary | ICD-10-CM | POA: Diagnosis not present

## 2016-02-22 DIAGNOSIS — E039 Hypothyroidism, unspecified: Secondary | ICD-10-CM | POA: Diagnosis not present

## 2016-02-23 ENCOUNTER — Ambulatory Visit: Payer: Medicare Other

## 2016-02-23 ENCOUNTER — Inpatient Hospital Stay: Admission: RE | Admit: 2016-02-23 | Payer: Self-pay | Source: Ambulatory Visit | Admitting: Radiation Oncology

## 2016-02-23 ENCOUNTER — Ambulatory Visit
Admission: RE | Admit: 2016-02-23 | Discharge: 2016-02-23 | Disposition: A | Discharge status: Home / Self Care | Payer: Medicare Other | Source: Ambulatory Visit | Attending: Radiation Oncology | Admitting: Radiation Oncology

## 2016-02-23 DIAGNOSIS — I4891 Unspecified atrial fibrillation: Secondary | ICD-10-CM | POA: Diagnosis not present

## 2016-02-23 DIAGNOSIS — E039 Hypothyroidism, unspecified: Secondary | ICD-10-CM | POA: Diagnosis not present

## 2016-02-23 DIAGNOSIS — C61 Malignant neoplasm of prostate: Secondary | ICD-10-CM | POA: Diagnosis not present

## 2016-02-23 DIAGNOSIS — Z510 Encounter for antineoplastic radiation therapy: Secondary | ICD-10-CM | POA: Diagnosis not present

## 2016-02-23 DIAGNOSIS — Z87891 Personal history of nicotine dependence: Secondary | ICD-10-CM | POA: Diagnosis not present

## 2016-02-24 ENCOUNTER — Ambulatory Visit
Admission: RE | Admit: 2016-02-24 | Discharge: 2016-02-24 | Disposition: A | Discharge status: Home / Self Care | Payer: Medicare Other | Source: Ambulatory Visit | Attending: Radiation Oncology | Admitting: Radiation Oncology

## 2016-02-24 ENCOUNTER — Encounter: Payer: Self-pay | Admitting: Radiation Oncology

## 2016-02-24 ENCOUNTER — Ambulatory Visit: Payer: Medicare Other

## 2016-02-24 VITALS — BP 141/64 | HR 64 | Resp 16 | Wt 170.0 lb

## 2016-02-24 DIAGNOSIS — I4891 Unspecified atrial fibrillation: Secondary | ICD-10-CM | POA: Diagnosis not present

## 2016-02-24 DIAGNOSIS — Z510 Encounter for antineoplastic radiation therapy: Secondary | ICD-10-CM | POA: Diagnosis not present

## 2016-02-24 DIAGNOSIS — C61 Malignant neoplasm of prostate: Secondary | ICD-10-CM | POA: Diagnosis not present

## 2016-02-24 DIAGNOSIS — E039 Hypothyroidism, unspecified: Secondary | ICD-10-CM | POA: Diagnosis not present

## 2016-02-24 DIAGNOSIS — Z87891 Personal history of nicotine dependence: Secondary | ICD-10-CM | POA: Diagnosis not present

## 2016-02-24 NOTE — Progress Notes (Signed)
  Radiation Oncology         [phone]   Name: Jesse [name] MRN: [id number]   Date: 02/24/2016  DOB: 14-Feb-1941   Weekly Radiation Therapy Management    ICD-9-CM ICD-10-CM   1. Malignant neoplasm of prostate ([facility]) 185 C61     Current Dose: 7.8 Gy  Planned Dose:  78 Gy  Narrative The patient presents for routine under treatment assessment.  Denies pain, dysuria or hematuria, urinary leakage, incontinence, urinary urgency or frequency, any bowel complaints, or fatigue. Describes a strong steady urine stream without difficulty emptying his bladder. Reports nocturia x3.  Set-up films were reviewed. The chart was checked.  Physical Findings  weight is 170 lb (77.1 kg). His blood pressure is 141/64 (abnormal) and his pulse is 64. His respiration is 16 and oxygen saturation is 100%. . Weight essentially stable.  No significant changes.  Impression The patient is tolerating radiation.  Plan Continue treatment as planned. I answered any questions the patient's wife asked.     Sheral Apley Tammi Klippel, M.D.  This document serves as a record of services personally performed by Tyler Pita, MD. It was created on his behalf by Darcus Austin, a trained medical scribe. The creation of this record is based on the scribe's personal observations and the provider's statements to them. This document has been checked and approved by the attending provider.

## 2016-02-24 NOTE — Progress Notes (Signed)
Weight and vitals stable. Denies pain. Reports nocturia x 3. Denies dysuria or hematuria. Denies urinary leakage or incontinence. Describes a strong steady urine stream without difficulty emptying his bladder. Denies urinary urgency or frequency. Denies any bowel complaints. Denies fatigue.   BP (!) 141/64 (BP Location: Right Arm, Patient Position: Sitting, Cuff Size: Normal)   Pulse 64   Resp 16   Wt 170 lb (77.1 kg)   SpO2 100%   BMI 27.44 kg/m  Wt Readings from Last 3 Encounters:  02/24/16 170 lb (77.1 kg)  12/07/15 167 lb 6.4 oz (75.9 kg)

## 2016-02-25 NOTE — Addendum Note (Signed)
Encounter addended by: Heywood Footman, RN on: 02/25/2016 10:30 AM<BR>    Actions taken: Chief Complaint modified, Patient Education assessment filed

## 2016-02-27 ENCOUNTER — Ambulatory Visit
Admission: RE | Admit: 2016-02-27 | Discharge: 2016-02-27 | Disposition: A | Discharge status: Home / Self Care | Payer: Medicare Other | Source: Ambulatory Visit | Attending: Radiation Oncology | Admitting: Radiation Oncology

## 2016-02-27 DIAGNOSIS — E039 Hypothyroidism, unspecified: Secondary | ICD-10-CM | POA: Diagnosis not present

## 2016-02-27 DIAGNOSIS — I4891 Unspecified atrial fibrillation: Secondary | ICD-10-CM | POA: Diagnosis not present

## 2016-02-27 DIAGNOSIS — Z510 Encounter for antineoplastic radiation therapy: Secondary | ICD-10-CM | POA: Diagnosis not present

## 2016-02-27 DIAGNOSIS — C61 Malignant neoplasm of prostate: Secondary | ICD-10-CM | POA: Diagnosis not present

## 2016-02-27 DIAGNOSIS — Z87891 Personal history of nicotine dependence: Secondary | ICD-10-CM | POA: Diagnosis not present

## 2016-02-28 ENCOUNTER — Ambulatory Visit
Admission: RE | Admit: 2016-02-28 | Discharge: 2016-02-28 | Disposition: A | Discharge status: Home / Self Care | Payer: Medicare Other | Source: Ambulatory Visit | Attending: Radiation Oncology | Admitting: Radiation Oncology

## 2016-02-28 DIAGNOSIS — Z510 Encounter for antineoplastic radiation therapy: Secondary | ICD-10-CM | POA: Diagnosis not present

## 2016-02-28 DIAGNOSIS — C61 Malignant neoplasm of prostate: Secondary | ICD-10-CM | POA: Diagnosis not present

## 2016-02-28 DIAGNOSIS — E039 Hypothyroidism, unspecified: Secondary | ICD-10-CM | POA: Diagnosis not present

## 2016-02-28 DIAGNOSIS — Z87891 Personal history of nicotine dependence: Secondary | ICD-10-CM | POA: Diagnosis not present

## 2016-02-28 DIAGNOSIS — I4891 Unspecified atrial fibrillation: Secondary | ICD-10-CM | POA: Diagnosis not present

## 2016-02-29 ENCOUNTER — Ambulatory Visit
Admission: RE | Admit: 2016-02-29 | Discharge: 2016-02-29 | Disposition: A | Discharge status: Home / Self Care | Payer: Medicare Other | Source: Ambulatory Visit | Attending: Radiation Oncology | Admitting: Radiation Oncology

## 2016-02-29 ENCOUNTER — Telehealth: Payer: Self-pay | Admitting: *Deleted

## 2016-02-29 DIAGNOSIS — C61 Malignant neoplasm of prostate: Secondary | ICD-10-CM | POA: Diagnosis not present

## 2016-02-29 DIAGNOSIS — E039 Hypothyroidism, unspecified: Secondary | ICD-10-CM | POA: Diagnosis not present

## 2016-02-29 DIAGNOSIS — Z87891 Personal history of nicotine dependence: Secondary | ICD-10-CM | POA: Diagnosis not present

## 2016-02-29 DIAGNOSIS — I4891 Unspecified atrial fibrillation: Secondary | ICD-10-CM | POA: Diagnosis not present

## 2016-02-29 DIAGNOSIS — Z510 Encounter for antineoplastic radiation therapy: Secondary | ICD-10-CM | POA: Diagnosis not present

## 2016-02-29 NOTE — Telephone Encounter (Signed)
On 03-01-16 gave patient medical records it was consult note, sim & planning note

## 2016-03-01 ENCOUNTER — Ambulatory Visit
Admission: RE | Admit: 2016-03-01 | Discharge: 2016-03-01 | Disposition: A | Discharge status: Home / Self Care | Payer: Medicare Other | Source: Ambulatory Visit | Attending: Radiation Oncology | Admitting: Radiation Oncology

## 2016-03-01 DIAGNOSIS — E039 Hypothyroidism, unspecified: Secondary | ICD-10-CM | POA: Diagnosis not present

## 2016-03-01 DIAGNOSIS — I4891 Unspecified atrial fibrillation: Secondary | ICD-10-CM | POA: Diagnosis not present

## 2016-03-01 DIAGNOSIS — Z510 Encounter for antineoplastic radiation therapy: Secondary | ICD-10-CM | POA: Diagnosis not present

## 2016-03-01 DIAGNOSIS — C61 Malignant neoplasm of prostate: Secondary | ICD-10-CM | POA: Diagnosis not present

## 2016-03-01 DIAGNOSIS — Z87891 Personal history of nicotine dependence: Secondary | ICD-10-CM | POA: Diagnosis not present

## 2016-03-02 ENCOUNTER — Ambulatory Visit
Admission: RE | Admit: 2016-03-02 | Discharge: 2016-03-02 | Disposition: A | Discharge status: Home / Self Care | Payer: Medicare Other | Source: Ambulatory Visit | Attending: Radiation Oncology | Admitting: Radiation Oncology

## 2016-03-02 ENCOUNTER — Encounter: Payer: Self-pay | Admitting: Radiation Oncology

## 2016-03-02 VITALS — BP 134/86 | HR 65 | Temp 98.1°F | Resp 16 | Ht 66.0 in | Wt 169.2 lb

## 2016-03-02 DIAGNOSIS — C61 Malignant neoplasm of prostate: Secondary | ICD-10-CM

## 2016-03-02 DIAGNOSIS — Z87891 Personal history of nicotine dependence: Secondary | ICD-10-CM | POA: Diagnosis not present

## 2016-03-02 DIAGNOSIS — Z510 Encounter for antineoplastic radiation therapy: Secondary | ICD-10-CM | POA: Diagnosis not present

## 2016-03-02 DIAGNOSIS — E039 Hypothyroidism, unspecified: Secondary | ICD-10-CM | POA: Diagnosis not present

## 2016-03-02 DIAGNOSIS — I4891 Unspecified atrial fibrillation: Secondary | ICD-10-CM | POA: Diagnosis not present

## 2016-03-02 NOTE — Progress Notes (Signed)
  Radiation Oncology         [phone]   Name: Jesse [name] MRN: [id number]   Date: 03/02/2016  DOB: 21-Dec-1940   Weekly Radiation Therapy Management    ICD-9-CM ICD-10-CM   1. Malignant neoplasm of prostate ([facility]) 185 C61     Current Dose: 17.55 Gy  Planned Dose:  78 Gy  Narrative The patient presents for routine under treatment assessment.  Weight and vitals stable. Denies pain. Reports nocturia x 2-3. Denies dysuria or hematuria. Denies urinary leakage or incontinence. Describes a strong steady urine stream without difficulty emptying his bladder. Denies urinary urgency or frequency. Bowel movements are loose with gas. Normal normal bowel movement in the morning. Denies fatigue.   Set-up films were reviewed. The chart was checked.  Physical Findings  height is 5\' 6"  (1.676 m) and weight is 169 lb 3.2 oz (76.7 kg). His oral temperature is 98.1 F (36.7 C). His blood pressure is 134/86 and his pulse is 65. His respiration is 16 and oxygen saturation is 100%. . Weight essentially stable.  No significant changes.  Impression The patient is tolerating radiation.  Plan Continue treatment as planned. I answered any questions the patient's wife asked.     Sheral Apley Tammi Klippel, M.D.  This document serves as a record of services personally performed by Tyler Pita, MD. It was created on his behalf by Darcus Austin, a trained medical scribe. The creation of this record is based on the scribe's personal observations and the provider's statements to them. This document has been checked and approved by the attending provider.

## 2016-03-02 NOTE — Progress Notes (Addendum)
Weight and vitals stable. Denies pain. Reports nocturia x 2-3. Denies dysuria or hematuria. Denies urinary leakage or incontinence. Describes a strong steady urine stream without difficulty emptying his bladder. Denies urinary urgency or frequency. Bowel movements  are loose with gas  And a normal bowel movement in the morning... Denies fatigue.  Wt Readings from Last 3 Encounters:  03/02/16 169 lb 3.2 oz (76.7 kg)  02/24/16 170 lb (77.1 kg)  12/07/15 167 lb 6.4 oz (75.9 kg)  BP 134/86 (BP Location: Right Arm, Patient Position: Sitting, Cuff Size: Normal)   Pulse 65   Temp 98.1 F (36.7 C) (Oral)   Resp 16   Ht 5\' 6"  (1.676 m)   Wt 169 lb 3.2 oz (76.7 kg)   SpO2 100%   BMI 27.31 kg/m

## 2016-03-05 ENCOUNTER — Ambulatory Visit
Admission: RE | Admit: 2016-03-05 | Discharge: 2016-03-05 | Disposition: A | Discharge status: Home / Self Care | Payer: Medicare Other | Source: Ambulatory Visit | Attending: Radiation Oncology | Admitting: Radiation Oncology

## 2016-03-05 DIAGNOSIS — Z87891 Personal history of nicotine dependence: Secondary | ICD-10-CM | POA: Diagnosis not present

## 2016-03-05 DIAGNOSIS — E039 Hypothyroidism, unspecified: Secondary | ICD-10-CM | POA: Diagnosis not present

## 2016-03-05 DIAGNOSIS — Z510 Encounter for antineoplastic radiation therapy: Secondary | ICD-10-CM | POA: Diagnosis not present

## 2016-03-05 DIAGNOSIS — C61 Malignant neoplasm of prostate: Secondary | ICD-10-CM | POA: Diagnosis not present

## 2016-03-05 DIAGNOSIS — I4891 Unspecified atrial fibrillation: Secondary | ICD-10-CM | POA: Diagnosis not present

## 2016-03-06 ENCOUNTER — Ambulatory Visit
Admission: RE | Admit: 2016-03-06 | Discharge: 2016-03-06 | Disposition: A | Discharge status: Home / Self Care | Payer: Medicare Other | Source: Ambulatory Visit | Attending: Radiation Oncology | Admitting: Radiation Oncology

## 2016-03-06 DIAGNOSIS — Z510 Encounter for antineoplastic radiation therapy: Secondary | ICD-10-CM | POA: Diagnosis not present

## 2016-03-06 DIAGNOSIS — C61 Malignant neoplasm of prostate: Secondary | ICD-10-CM | POA: Diagnosis not present

## 2016-03-07 ENCOUNTER — Ambulatory Visit
Admission: RE | Admit: 2016-03-07 | Discharge: 2016-03-07 | Disposition: A | Discharge status: Home / Self Care | Payer: Medicare Other | Source: Ambulatory Visit | Attending: Radiation Oncology | Admitting: Radiation Oncology

## 2016-03-07 DIAGNOSIS — C61 Malignant neoplasm of prostate: Secondary | ICD-10-CM | POA: Diagnosis not present

## 2016-03-07 DIAGNOSIS — Z510 Encounter for antineoplastic radiation therapy: Secondary | ICD-10-CM | POA: Diagnosis not present

## 2016-03-08 ENCOUNTER — Ambulatory Visit
Admission: RE | Admit: 2016-03-08 | Discharge: 2016-03-08 | Disposition: A | Discharge status: Home / Self Care | Payer: Medicare Other | Source: Ambulatory Visit | Attending: Radiation Oncology | Admitting: Radiation Oncology

## 2016-03-08 ENCOUNTER — Encounter: Payer: Self-pay | Admitting: Radiation Oncology

## 2016-03-08 VITALS — BP 143/90 | HR 65 | Resp 16 | Wt 168.6 lb

## 2016-03-08 DIAGNOSIS — Z510 Encounter for antineoplastic radiation therapy: Secondary | ICD-10-CM | POA: Diagnosis not present

## 2016-03-08 DIAGNOSIS — C61 Malignant neoplasm of prostate: Secondary | ICD-10-CM

## 2016-03-08 NOTE — Progress Notes (Signed)
  Radiation Oncology         [phone]   Name: Jesse [name] MRN: [id number]   Date: 03/08/2016  DOB: March 13, 1941   Weekly Radiation Therapy Management    ICD-9-CM ICD-10-CM   1. Malignant neoplasm of prostate ([facility]) 185 C61     Current Dose: 25.35 Gy  Planned Dose:  78 Gy  Narrative The patient presents for routine under treatment assessment.  Weight and vitals stable. Denies pain. Reports nocturia x 2. Denies dysuria or hematuria. Denies urinary leakage or incontinence. Describes a strong steady urine stream without difficulty emptying his bladder. Denies urinary urgency or frequency. Reports one episode approximately 4 days ago or burning discomfort following a bowel movement but none since. Denies blood in stool. Describes his present bowel movements as formed. Denies fatigue.  Set-up films were reviewed. The chart was checked.  Physical Findings  weight is 168 lb 9.6 oz (76.5 kg). His blood pressure is 143/90 (abnormal) and his pulse is 65. His respiration is 16 and oxygen saturation is 100%. . Weight essentially stable.  No significant changes.  Impression The patient is tolerating radiation.  Plan Continue treatment as planned.     Sheral Apley Tammi Klippel, M.D.  This document serves as a record of services personally performed by Jesse Pita, MD. It was created on his behalf by Maryla Morrow, a trained medical scribe. The creation of this record is based on the scribe's personal observations and the provider's statements to them. This document has been checked and approved by the attending provider.   Marland Kitchen

## 2016-03-08 NOTE — Progress Notes (Signed)
Weight and vitals stable. Denies pain. Reports nocturia x 2. Denies dysuria or hematuria. Denies urinary leakage or incontinence. Describes a strong steady urine stream without difficulty emptying his bladder. Denies urinary urgency or frequency. Reports one episode approximately 4 days ago of burning discomfort following a bowel movement but, none since Denies blood in stool. Describes his present bowel movements as formed. Denies fatigue.   BP (!) 143/90 (BP Location: Right Arm, Patient Position: Sitting, Cuff Size: Normal)   Pulse 65   Resp 16   Wt 168 lb 9.6 oz (76.5 kg)   SpO2 100%   BMI 27.21 kg/m  Wt Readings from Last 3 Encounters:  03/08/16 168 lb 9.6 oz (76.5 kg)  03/02/16 169 lb 3.2 oz (76.7 kg)  02/24/16 170 lb (77.1 kg)

## 2016-03-09 ENCOUNTER — Ambulatory Visit
Admission: RE | Admit: 2016-03-09 | Discharge: 2016-03-09 | Disposition: A | Discharge status: Home / Self Care | Payer: Medicare Other | Source: Ambulatory Visit | Attending: Radiation Oncology | Admitting: Radiation Oncology

## 2016-03-09 DIAGNOSIS — C61 Malignant neoplasm of prostate: Secondary | ICD-10-CM | POA: Diagnosis not present

## 2016-03-09 DIAGNOSIS — Z510 Encounter for antineoplastic radiation therapy: Secondary | ICD-10-CM | POA: Diagnosis not present

## 2016-03-12 ENCOUNTER — Ambulatory Visit
Admission: RE | Admit: 2016-03-12 | Discharge: 2016-03-12 | Disposition: A | Discharge status: Home / Self Care | Payer: Medicare Other | Source: Ambulatory Visit | Attending: Radiation Oncology | Admitting: Radiation Oncology

## 2016-03-12 DIAGNOSIS — C61 Malignant neoplasm of prostate: Secondary | ICD-10-CM | POA: Diagnosis not present

## 2016-03-12 DIAGNOSIS — Z510 Encounter for antineoplastic radiation therapy: Secondary | ICD-10-CM | POA: Diagnosis not present

## 2016-03-13 ENCOUNTER — Ambulatory Visit
Admission: RE | Admit: 2016-03-13 | Discharge: 2016-03-13 | Disposition: A | Discharge status: Home / Self Care | Payer: Medicare Other | Source: Ambulatory Visit | Attending: Radiation Oncology | Admitting: Radiation Oncology

## 2016-03-13 DIAGNOSIS — Z510 Encounter for antineoplastic radiation therapy: Secondary | ICD-10-CM | POA: Diagnosis not present

## 2016-03-13 DIAGNOSIS — C61 Malignant neoplasm of prostate: Secondary | ICD-10-CM | POA: Diagnosis not present

## 2016-03-14 ENCOUNTER — Ambulatory Visit
Admission: RE | Admit: 2016-03-14 | Discharge: 2016-03-14 | Disposition: A | Discharge status: Home / Self Care | Payer: Medicare Other | Source: Ambulatory Visit | Attending: Radiation Oncology | Admitting: Radiation Oncology

## 2016-03-14 DIAGNOSIS — Z510 Encounter for antineoplastic radiation therapy: Secondary | ICD-10-CM | POA: Diagnosis not present

## 2016-03-14 DIAGNOSIS — C61 Malignant neoplasm of prostate: Secondary | ICD-10-CM | POA: Diagnosis not present

## 2016-03-15 ENCOUNTER — Ambulatory Visit
Admission: RE | Admit: 2016-03-15 | Discharge: 2016-03-15 | Disposition: A | Discharge status: Home / Self Care | Payer: Medicare Other | Source: Ambulatory Visit | Attending: Radiation Oncology | Admitting: Radiation Oncology

## 2016-03-15 VITALS — BP 130/84 | HR 63 | Resp 16 | Wt 169.6 lb

## 2016-03-15 DIAGNOSIS — C61 Malignant neoplasm of prostate: Secondary | ICD-10-CM

## 2016-03-15 DIAGNOSIS — L602 Onychogryphosis: Secondary | ICD-10-CM | POA: Diagnosis not present

## 2016-03-15 DIAGNOSIS — M79674 Pain in right toe(s): Secondary | ICD-10-CM | POA: Diagnosis not present

## 2016-03-15 DIAGNOSIS — M79675 Pain in left toe(s): Secondary | ICD-10-CM | POA: Diagnosis not present

## 2016-03-15 DIAGNOSIS — Z510 Encounter for antineoplastic radiation therapy: Secondary | ICD-10-CM | POA: Diagnosis not present

## 2016-03-15 NOTE — Progress Notes (Addendum)
Weight and vitals stable. Denies pain. Reports nocturia x 2. Denies dysuria or hematuria. Denies urinary leakage or incontinence. Describes a strong steady urine stream without difficulty emptying his bladder. Denies urinary urgency or frequency.  Describes his present bowel movements as formed. Denies fatigue.  BP 130/84 (BP Location: Right Arm, Patient Position: Sitting, Cuff Size: Normal)   Pulse 63   Resp 16   Wt 169 lb 9.6 oz (76.9 kg)   SpO2 100%   BMI 27.37 kg/m  Wt Readings from Last 3 Encounters:  03/15/16 169 lb 9.6 oz (76.9 kg)  03/08/16 168 lb 9.6 oz (76.5 kg)  03/02/16 169 lb 3.2 oz (76.7 kg)

## 2016-03-15 NOTE — Progress Notes (Signed)
  Radiation Oncology         [phone]   Name: Jesse [name] MRN: [id number]   Date: 03/15/2016  DOB: 01-16-1941   Weekly Radiation Therapy Management    ICD-9-CM ICD-10-CM   1. Malignant neoplasm of prostate ([facility]) 185 C61     Current Dose: 35.1 Gy  Planned Dose:  78 Gy  Narrative The patient presents for routine under treatment assessment.  Weight and vitals stable. Denies pain. Reports nocturia x 2. Denies dysuria or hematuria. Denies urinary leakage or incontinence. Describes a strong steady urine stream without difficulty emptying his bladder. Denies urinary urgency or frequency. Reports one episode approximately 4 days ago of burning discomfort following a bowel movement but, none since Denies blood in stool. Describes his present bowel movements as formed. Denies fatigue.  Set-up films were reviewed. The chart was checked.  Physical Findings  weight is 169 lb 9.6 oz (76.9 kg). His blood pressure is 130/84 and his pulse is 63. His respiration is 16 and oxygen saturation is 100%.  Weight essentially stable.  No significant changes.  Impression The patient is tolerating radiation.  Plan Continue treatment as planned.     Sheral Apley Tammi Klippel, M.D.  This document serves as a record of services personally performed by Tyler Pita, MD. It was created on his behalf by Arlyce Harman, a trained medical scribe. The creation of this record is based on the scribe's personal observations and the provider's statements to them. This document has been checked and approved by the attending provider.

## 2016-03-16 ENCOUNTER — Ambulatory Visit
Admission: RE | Admit: 2016-03-16 | Discharge: 2016-03-16 | Disposition: A | Discharge status: Home / Self Care | Payer: Medicare Other | Source: Ambulatory Visit | Attending: Radiation Oncology | Admitting: Radiation Oncology

## 2016-03-16 DIAGNOSIS — C61 Malignant neoplasm of prostate: Secondary | ICD-10-CM | POA: Diagnosis not present

## 2016-03-16 DIAGNOSIS — Z510 Encounter for antineoplastic radiation therapy: Secondary | ICD-10-CM | POA: Diagnosis not present

## 2016-03-19 ENCOUNTER — Ambulatory Visit
Admission: RE | Admit: 2016-03-19 | Discharge: 2016-03-19 | Disposition: A | Discharge status: Home / Self Care | Payer: Medicare Other | Source: Ambulatory Visit | Attending: Radiation Oncology | Admitting: Radiation Oncology

## 2016-03-19 DIAGNOSIS — Z510 Encounter for antineoplastic radiation therapy: Secondary | ICD-10-CM | POA: Diagnosis not present

## 2016-03-19 DIAGNOSIS — C61 Malignant neoplasm of prostate: Secondary | ICD-10-CM | POA: Diagnosis not present

## 2016-03-20 ENCOUNTER — Ambulatory Visit
Admission: RE | Admit: 2016-03-20 | Discharge: 2016-03-20 | Disposition: A | Discharge status: Home / Self Care | Payer: Medicare Other | Source: Ambulatory Visit | Attending: Radiation Oncology | Admitting: Radiation Oncology

## 2016-03-20 DIAGNOSIS — Z510 Encounter for antineoplastic radiation therapy: Secondary | ICD-10-CM | POA: Diagnosis not present

## 2016-03-20 DIAGNOSIS — C61 Malignant neoplasm of prostate: Secondary | ICD-10-CM | POA: Diagnosis not present

## 2016-03-21 ENCOUNTER — Ambulatory Visit
Admission: RE | Admit: 2016-03-21 | Discharge: 2016-03-21 | Disposition: A | Discharge status: Home / Self Care | Payer: Medicare Other | Source: Ambulatory Visit | Attending: Radiation Oncology | Admitting: Radiation Oncology

## 2016-03-21 DIAGNOSIS — C61 Malignant neoplasm of prostate: Secondary | ICD-10-CM | POA: Diagnosis not present

## 2016-03-21 DIAGNOSIS — Z510 Encounter for antineoplastic radiation therapy: Secondary | ICD-10-CM | POA: Diagnosis not present

## 2016-03-22 ENCOUNTER — Ambulatory Visit
Admission: RE | Admit: 2016-03-22 | Discharge: 2016-03-22 | Disposition: A | Discharge status: Home / Self Care | Payer: Medicare Other | Source: Ambulatory Visit | Attending: Radiation Oncology | Admitting: Radiation Oncology

## 2016-03-22 DIAGNOSIS — C61 Malignant neoplasm of prostate: Secondary | ICD-10-CM | POA: Diagnosis not present

## 2016-03-22 DIAGNOSIS — Z510 Encounter for antineoplastic radiation therapy: Secondary | ICD-10-CM | POA: Diagnosis not present

## 2016-03-23 ENCOUNTER — Ambulatory Visit
Admission: RE | Admit: 2016-03-23 | Discharge: 2016-03-23 | Disposition: A | Discharge status: Home / Self Care | Payer: Medicare Other | Source: Ambulatory Visit | Attending: Radiation Oncology | Admitting: Radiation Oncology

## 2016-03-23 ENCOUNTER — Encounter: Payer: Self-pay | Admitting: Radiation Oncology

## 2016-03-23 VITALS — BP 125/90 | HR 61 | Resp 16 | Wt 171.8 lb

## 2016-03-23 DIAGNOSIS — Z510 Encounter for antineoplastic radiation therapy: Secondary | ICD-10-CM | POA: Diagnosis not present

## 2016-03-23 DIAGNOSIS — C61 Malignant neoplasm of prostate: Secondary | ICD-10-CM

## 2016-03-23 NOTE — Progress Notes (Signed)
  Radiation Oncology         [phone]   Name: Jesse [name] MRN: [id number]   Date: 03/23/2016  DOB: 02/18/1941   Weekly Radiation Therapy Management    ICD-9-CM ICD-10-CM   1. Malignant neoplasm of prostate (HCC) 185 C61     Current Dose: 46.8 Gy  Planned Dose:  78 Gy  Narrative The patient presents for routine under treatment assessment.  Weight and vitals stable. Patient denies pain. He reports nocturia x 2. Denies dysuria or hematuria. Patient denies urinary leakage or incontinence. Patient describes a strong, steady urine stream without difficulty emptying his bladder. He denies urinary urgency or frequency. Patient describes his present bowel movements as formed. He denies fatigue. Patient questions if his hip replacement affects his radiation treatments. His wife is in attendance today and questions how I may know at the end of treatment that a difference has been made.  Set-up films were reviewed. The chart was checked.  Physical Findings  weight is 171 lb 12.8 oz (77.9 kg). His blood pressure is 125/90 and his pulse is 61. His respiration is 16 and oxygen saturation is 100%.  Weight essentially stable.  No significant changes.  Impression The patient is tolerating radiation.  Plan Continue treatment as planned. I discussed the planning associated with radiation on a patient with a hip replacement, as well as the use of PSA tests post treatment to test the effectiveness of treatment.     Sheral Apley Tammi Klippel, M.D.  This document serves as a record of services personally performed by Tyler Pita, MD. It was created on his behalf by Maryla Morrow, a trained medical scribe. The creation of this record is based on the scribe's personal observations and the provider's statements to them. This document has been checked and approved by the attending provider.

## 2016-03-23 NOTE — Progress Notes (Signed)
Weight and vitals stable. Denies pain. Reports nocturia x 2. Denies dysuria or hematuria. Denies urinary leakage or incontinence. Describes a strong steady urine stream without difficulty emptying his bladder. Denies urinary urgency or frequency.  Describes his present bowel movements as formed. Denies fatigue.  BP 125/90 (BP Location: Right Arm, Patient Position: Sitting, Cuff Size: Normal)   Pulse 61   Resp 16   Wt 171 lb 12.8 oz (77.9 kg)   SpO2 100%   BMI 27.73 kg/m  Wt Readings from Last 3 Encounters:  03/23/16 171 lb 12.8 oz (77.9 kg)  03/15/16 169 lb 9.6 oz (76.9 kg)  03/08/16 168 lb 9.6 oz (76.5 kg)

## 2016-03-26 ENCOUNTER — Ambulatory Visit
Admission: RE | Admit: 2016-03-26 | Discharge: 2016-03-26 | Disposition: A | Discharge status: Home / Self Care | Payer: Medicare Other | Source: Ambulatory Visit | Attending: Radiation Oncology | Admitting: Radiation Oncology

## 2016-03-26 DIAGNOSIS — Z510 Encounter for antineoplastic radiation therapy: Secondary | ICD-10-CM | POA: Diagnosis not present

## 2016-03-26 DIAGNOSIS — C61 Malignant neoplasm of prostate: Secondary | ICD-10-CM | POA: Diagnosis not present

## 2016-03-27 ENCOUNTER — Ambulatory Visit
Admission: RE | Admit: 2016-03-27 | Discharge: 2016-03-27 | Disposition: A | Discharge status: Home / Self Care | Payer: Medicare Other | Source: Ambulatory Visit | Attending: Radiation Oncology | Admitting: Radiation Oncology

## 2016-03-27 VITALS — BP 138/95 | HR 68 | Resp 16 | Wt 175.0 lb

## 2016-03-27 DIAGNOSIS — C61 Malignant neoplasm of prostate: Secondary | ICD-10-CM | POA: Diagnosis not present

## 2016-03-27 DIAGNOSIS — Z510 Encounter for antineoplastic radiation therapy: Secondary | ICD-10-CM | POA: Diagnosis not present

## 2016-03-27 NOTE — Progress Notes (Signed)
  Radiation Oncology         [phone]   Name: Jesse [name] MRN: [id number]   Date: 03/27/2016  DOB: 1940/10/05   Weekly Radiation Therapy Management    ICD-9-CM ICD-10-CM   1. Malignant neoplasm of prostate (HCC) 185 C61     Current Dose: 50.7 Gy  Planned Dose:  78 Gy  Narrative The patient presents for routine under treatment assessment. Reports nocturia x 2. Denies dysuria or hematuria. Denies urinary leakage or incontinence. Describes a strong steady urine stream without difficulty emptying his bladder. Denies urinary urgency or frequency. Describes his present bowel movements as formed. Denies fatigue.  The patient is without complaint. Set-up films were reviewed. The chart was checked.  Physical Findings  weight is 175 lb (79.4 kg). His blood pressure is 138/95 (abnormal) and his pulse is 68. His respiration is 16 and oxygen saturation is 100%. . Weight essentially stable.  No significant changes.  Impression The patient is tolerating radiation.  Plan Continue treatment as planned.         Sheral Apley Tammi Klippel, M.D.

## 2016-03-27 NOTE — Progress Notes (Signed)
Weight and vitals stable. Denies pain. Reports nocturia x 2. Denies dysuria or hematuria. Denies urinary leakage or incontinence. Describes a strong steady urine stream without difficulty emptying his bladder. Denies urinary urgency or frequency. Describes his present bowel movements as formed. Denies fatigue.  BP (!) 138/95 (BP Location: Right Arm, Patient Position: Sitting, Cuff Size: Normal)   Pulse 68   Resp 16   Wt 175 lb (79.4 kg)   SpO2 100%   BMI 28.25 kg/m  Wt Readings from Last 3 Encounters:  03/27/16 175 lb (79.4 kg)  03/23/16 171 lb 12.8 oz (77.9 kg)  03/15/16 169 lb 9.6 oz (76.9 kg)

## 2016-03-28 ENCOUNTER — Ambulatory Visit
Admission: RE | Admit: 2016-03-28 | Discharge: 2016-03-28 | Disposition: A | Discharge status: Home / Self Care | Payer: Medicare Other | Source: Ambulatory Visit | Attending: Radiation Oncology | Admitting: Radiation Oncology

## 2016-03-28 DIAGNOSIS — Z510 Encounter for antineoplastic radiation therapy: Secondary | ICD-10-CM | POA: Diagnosis not present

## 2016-03-28 DIAGNOSIS — C61 Malignant neoplasm of prostate: Secondary | ICD-10-CM | POA: Diagnosis not present

## 2016-03-29 ENCOUNTER — Ambulatory Visit
Admission: RE | Admit: 2016-03-29 | Discharge: 2016-03-29 | Disposition: A | Discharge status: Home / Self Care | Payer: Medicare Other | Source: Ambulatory Visit | Attending: Radiation Oncology | Admitting: Radiation Oncology

## 2016-03-29 DIAGNOSIS — Z510 Encounter for antineoplastic radiation therapy: Secondary | ICD-10-CM | POA: Diagnosis not present

## 2016-03-29 DIAGNOSIS — C61 Malignant neoplasm of prostate: Secondary | ICD-10-CM | POA: Diagnosis not present

## 2016-03-30 ENCOUNTER — Ambulatory Visit
Admission: RE | Admit: 2016-03-30 | Discharge: 2016-03-30 | Disposition: A | Discharge status: Home / Self Care | Payer: Medicare Other | Source: Ambulatory Visit | Attending: Radiation Oncology | Admitting: Radiation Oncology

## 2016-03-30 DIAGNOSIS — Z510 Encounter for antineoplastic radiation therapy: Secondary | ICD-10-CM | POA: Diagnosis not present

## 2016-03-30 DIAGNOSIS — C61 Malignant neoplasm of prostate: Secondary | ICD-10-CM | POA: Diagnosis not present

## 2016-04-02 ENCOUNTER — Encounter: Payer: Self-pay | Admitting: Medical Oncology

## 2016-04-02 ENCOUNTER — Ambulatory Visit
Admission: RE | Admit: 2016-04-02 | Discharge: 2016-04-02 | Disposition: A | Discharge status: Home / Self Care | Payer: Medicare Other | Source: Ambulatory Visit | Attending: Radiation Oncology | Admitting: Radiation Oncology

## 2016-04-02 DIAGNOSIS — C61 Malignant neoplasm of prostate: Secondary | ICD-10-CM | POA: Diagnosis not present

## 2016-04-02 DIAGNOSIS — Z510 Encounter for antineoplastic radiation therapy: Secondary | ICD-10-CM | POA: Diagnosis not present

## 2016-04-03 ENCOUNTER — Ambulatory Visit
Admission: RE | Admit: 2016-04-03 | Discharge: 2016-04-03 | Disposition: A | Discharge status: Home / Self Care | Payer: Medicare Other | Source: Ambulatory Visit | Attending: Radiation Oncology | Admitting: Radiation Oncology

## 2016-04-03 DIAGNOSIS — Z510 Encounter for antineoplastic radiation therapy: Secondary | ICD-10-CM | POA: Diagnosis not present

## 2016-04-03 DIAGNOSIS — C61 Malignant neoplasm of prostate: Secondary | ICD-10-CM | POA: Diagnosis not present

## 2016-04-04 ENCOUNTER — Ambulatory Visit
Admission: RE | Admit: 2016-04-04 | Discharge: 2016-04-04 | Disposition: A | Discharge status: Home / Self Care | Payer: Medicare Other | Source: Ambulatory Visit | Attending: Radiation Oncology | Admitting: Radiation Oncology

## 2016-04-04 DIAGNOSIS — C61 Malignant neoplasm of prostate: Secondary | ICD-10-CM | POA: Diagnosis not present

## 2016-04-04 DIAGNOSIS — Z510 Encounter for antineoplastic radiation therapy: Secondary | ICD-10-CM | POA: Diagnosis not present

## 2016-04-05 ENCOUNTER — Ambulatory Visit
Admission: RE | Admit: 2016-04-05 | Discharge: 2016-04-05 | Disposition: A | Discharge status: Home / Self Care | Payer: Medicare Other | Source: Ambulatory Visit | Attending: Radiation Oncology | Admitting: Radiation Oncology

## 2016-04-05 VITALS — BP 134/77 | HR 71 | Resp 16 | Wt 171.8 lb

## 2016-04-05 DIAGNOSIS — C61 Malignant neoplasm of prostate: Secondary | ICD-10-CM | POA: Diagnosis not present

## 2016-04-05 DIAGNOSIS — Z510 Encounter for antineoplastic radiation therapy: Secondary | ICD-10-CM | POA: Diagnosis not present

## 2016-04-05 NOTE — Progress Notes (Signed)
  Radiation Oncology         [phone]   Name: Jesse [name] MRN: [id number]   Date: 04/05/2016  DOB: Aug 06, 1940     Weekly Radiation Therapy Management    ICD-9-CM ICD-10-CM   1. Malignant neoplasm of prostate (HCC) 185 C61     Current Dose: 64.35 Gy  Planned Dose:  78 Gy  Narrative The patient presents for routine under treatment assessment.  Vitals table. 4 lb weight loss noted. Denies pain. Reports nocturia x2. Denies dysuria or hematuria. Denies urinary leakage or incontinence. Describes a strong steady urine stream without difficulty emptying his bladder. Denies urinary urgency. Reports urinary frequency. Describes his present bowel movements as formed. Reports for 2-3 days his stomach has felt unsettled. Denies nausea or vomiting. Denies fatigue.   Set-up films were reviewed. The chart was checked.  Physical Findings  weight is 171 lb 12.8 oz (77.9 kg). His blood pressure is 134/77 and his pulse is 71. His respiration is 16 and oxygen saturation is 100%. . Weight loss of 4 lb since 03/27/16.  No significant changes.  Impression The patient is tolerating radiation.  Plan Continue treatment as planned.         Sheral Apley Tammi Klippel, M.D.  This document serves as a record of services personally performed by Tyler Pita, MD and Shona Simpson, PA-C. It was created on his behalf by Arlyce Harman, a trained medical scribe. The creation of this record is based on the scribe's personal observations and the provider's statements to them. This document has been checked and approved by the attending provider.

## 2016-04-05 NOTE — Progress Notes (Addendum)
Vitals stable. 4lb weight loss noted. Denies pain. Reports nocturia x 2. Denies dysuria or hematuria. Denies urinary leakage or incontinence. Describes a strong steady urine stream without difficulty emptying his bladder. Denies urinary urgency. Reports urinary frequency.Describes his present bowel movements as formed. Reports for 2-3 days his stomach has felt unsettled. Denies nausea or vomiting. Denies fatigue.  BP 134/77 (BP Location: Left Arm, Patient Position: Sitting, Cuff Size: Normal)   Pulse 71   Resp 16   Wt 171 lb 12.8 oz (77.9 kg)   SpO2 100%   BMI 27.73 kg/m  Wt Readings from Last 3 Encounters:  04/05/16 171 lb 12.8 oz (77.9 kg)  03/27/16 175 lb (79.4 kg)  03/23/16 171 lb 12.8 oz (77.9 kg)

## 2016-04-06 ENCOUNTER — Ambulatory Visit
Admission: RE | Admit: 2016-04-06 | Discharge: 2016-04-06 | Disposition: A | Discharge status: Home / Self Care | Payer: Medicare Other | Source: Ambulatory Visit | Attending: Radiation Oncology | Admitting: Radiation Oncology

## 2016-04-06 DIAGNOSIS — C61 Malignant neoplasm of prostate: Secondary | ICD-10-CM | POA: Diagnosis not present

## 2016-04-06 DIAGNOSIS — Z510 Encounter for antineoplastic radiation therapy: Secondary | ICD-10-CM | POA: Diagnosis not present

## 2016-04-09 ENCOUNTER — Ambulatory Visit
Admission: RE | Admit: 2016-04-09 | Discharge: 2016-04-09 | Disposition: A | Discharge status: Home / Self Care | Payer: Medicare Other | Source: Ambulatory Visit | Attending: Radiation Oncology | Admitting: Radiation Oncology

## 2016-04-09 DIAGNOSIS — Z510 Encounter for antineoplastic radiation therapy: Secondary | ICD-10-CM | POA: Diagnosis not present

## 2016-04-09 DIAGNOSIS — C61 Malignant neoplasm of prostate: Secondary | ICD-10-CM | POA: Diagnosis not present

## 2016-04-10 ENCOUNTER — Ambulatory Visit
Admission: RE | Admit: 2016-04-10 | Discharge: 2016-04-10 | Disposition: A | Discharge status: Home / Self Care | Payer: Medicare Other | Source: Ambulatory Visit | Attending: Radiation Oncology | Admitting: Radiation Oncology

## 2016-04-10 DIAGNOSIS — Z510 Encounter for antineoplastic radiation therapy: Secondary | ICD-10-CM | POA: Diagnosis not present

## 2016-04-10 DIAGNOSIS — C61 Malignant neoplasm of prostate: Secondary | ICD-10-CM | POA: Diagnosis not present

## 2016-04-11 ENCOUNTER — Ambulatory Visit
Admission: RE | Admit: 2016-04-11 | Discharge: 2016-04-11 | Disposition: A | Discharge status: Home / Self Care | Payer: Medicare Other | Source: Ambulatory Visit | Attending: Radiation Oncology | Admitting: Radiation Oncology

## 2016-04-11 DIAGNOSIS — Z510 Encounter for antineoplastic radiation therapy: Secondary | ICD-10-CM | POA: Diagnosis not present

## 2016-04-11 DIAGNOSIS — C61 Malignant neoplasm of prostate: Secondary | ICD-10-CM | POA: Diagnosis not present

## 2016-04-12 ENCOUNTER — Ambulatory Visit
Admission: RE | Admit: 2016-04-12 | Discharge: 2016-04-12 | Disposition: A | Discharge status: Home / Self Care | Payer: Medicare Other | Source: Ambulatory Visit | Attending: Radiation Oncology | Admitting: Radiation Oncology

## 2016-04-12 DIAGNOSIS — C61 Malignant neoplasm of prostate: Secondary | ICD-10-CM | POA: Diagnosis not present

## 2016-04-12 DIAGNOSIS — Z510 Encounter for antineoplastic radiation therapy: Secondary | ICD-10-CM | POA: Diagnosis not present

## 2016-04-13 ENCOUNTER — Encounter: Payer: Self-pay | Admitting: Medical Oncology

## 2016-04-13 ENCOUNTER — Ambulatory Visit
Admission: RE | Admit: 2016-04-13 | Discharge: 2016-04-13 | Disposition: A | Discharge status: Home / Self Care | Payer: Medicare Other | Source: Ambulatory Visit | Attending: Radiation Oncology | Admitting: Radiation Oncology

## 2016-04-13 ENCOUNTER — Encounter: Payer: Self-pay | Admitting: Radiation Oncology

## 2016-04-13 DIAGNOSIS — Z510 Encounter for antineoplastic radiation therapy: Secondary | ICD-10-CM | POA: Diagnosis not present

## 2016-04-13 DIAGNOSIS — C61 Malignant neoplasm of prostate: Secondary | ICD-10-CM | POA: Diagnosis not present

## 2016-04-16 ENCOUNTER — Ambulatory Visit
Admission: RE | Admit: 2016-04-16 | Discharge: 2016-04-16 | Disposition: A | Discharge status: Home / Self Care | Payer: Medicare Other | Source: Ambulatory Visit | Attending: Radiation Oncology | Admitting: Radiation Oncology

## 2016-04-16 ENCOUNTER — Encounter: Payer: Self-pay | Admitting: Radiation Oncology

## 2016-04-16 VITALS — BP 137/88 | HR 62 | Temp 98.1°F | Resp 20 | Wt 170.0 lb

## 2016-04-16 DIAGNOSIS — C61 Malignant neoplasm of prostate: Secondary | ICD-10-CM

## 2016-04-16 DIAGNOSIS — Z510 Encounter for antineoplastic radiation therapy: Secondary | ICD-10-CM | POA: Diagnosis not present

## 2016-04-16 NOTE — Progress Notes (Signed)
Weekly rad txs prostate, 40/40 completed,  no dysuria, no hematuria,   Good stream,  Nocturia x2,  Regular bowels, no pain, appetite great,no fatigue 1 month  Follow up 05/29/2016 with Shona Simpson, PA BP 137/88 (BP Location: Left Arm, Patient Position: Sitting, Cuff Size: Normal)   Pulse 62   Temp 98.1 F (36.7 C) (Oral)   Resp 20   Wt 170 lb (77.1 kg)   BMI 27.44 kg/m   Wt Readings from Last 3 Encounters:  04/16/16 170 lb (77.1 kg)  04/05/16 171 lb 12.8 oz (77.9 kg)  03/27/16 175 lb (79.4 kg)

## 2016-04-16 NOTE — Progress Notes (Signed)
  Radiation Oncology         ([phone] ________________________________  Name: Jesse [name] MRN: [id number]  Date: 04/16/2016  DOB: Nov 24, 1940  Weekly Radiation Therapy Management    ICD-9-CM ICD-10-CM   1. Malignant neoplasm of prostate (HCC) 185 C61     Current Dose: 78 Gy     Planned Dose:  78 Gy  Narrative . . . . . . . . The patient presents for the final under treatment assessment.                                           The patient has had some continuation of previously noted symptoms. Patient notes nocturia x 2. He reports a good stream, regular bowels, and a good appetite. He denies dysuria, hematuria, pain, or fatigue. Patient notes hip pain, that does not interfere with his daily life activities. He does not believe the hip pain is related to the radiation.                                 Set-up films were reviewed.                                 The chart was checked. Physical Findings. . . Weight essentially stable.  No significant changes. Impression . . . . . . . The patient tolerated radiation relatively well. Plan . . . . . . . . . . . . Complete radiation today as scheduled, and follow-up in one month. Patient will follow up on 05/29/16 with Shona Simpson , PA . The patient was encouraged to call or return to the clinic in the interim for any worsening symptoms.  ________________________________  Sheral Apley Tammi Klippel, M.D.   This document serves as a record of services personally performed by Tyler Pita, MD. It was created on his behalf by Bethann Humble, a trained medical scribe. The creation of this record is based on the scribe's personal observations and the provider's statements to them. This document has been checked and approved by the attending provider.

## 2016-04-20 DIAGNOSIS — Z23 Encounter for immunization: Secondary | ICD-10-CM | POA: Diagnosis not present

## 2016-04-21 NOTE — Progress Notes (Signed)
  Radiation Oncology         ([phone]) [phone] ________________________________  Name: Jesse [name] MRN: [id number]  Date: 04/16/2016  DOB: [birth year]  End of Treatment Note  Diagnosis:   75 y.o. gentleman with stage T2a adenocarcinoma of the prostate with a Gleason's score of 4+3 and a PSA of 5.5     Indication for treatment:  Curative, Definitive Radiotherapy       Radiation treatment dates:   02/21/16-04/16/16  Site/dose:   The prostate was treated to 78 Gy in 40 fractions of 1.95 Gy  Beams/energy:   The patient was treated with IMRT using volumetric arc therapy delivering 6 MV X-rays to clockwise and counterclockwise circumferential arcs with a 90 degree collimator offset to avoid dose scalloping.  Image guidance was performed with daily cone beam CT prior to each fraction to align to gold markers in the prostate and assure proper bladder and rectal fill volumes.  Immobilization was achieved with BodyFix custom mold.  Narrative: The patient tolerated radiation treatment relatively well.   The patient experienced some minor urinary irritation and modest fatigue.  Patient notes nocturia x 2. He reports a good stream, regular bowels, and a good appetite. He denies dysuria, hematuria, pain, or fatigue. Patient notes hip pain, that does not interfere with his daily life activities. He does not believe the hip pain is related to the radiation  Plan: The patient has completed radiation treatment. He will return to radiation oncology clinic for routine followup in one month. I advised him to call or return sooner if he has any questions or concerns related to his recovery or treatment. ________________________________  Sheral Apley. Tammi Klippel, M.D.

## 2016-05-23 NOTE — Progress Notes (Addendum)
Mr. [name] 75 year old man is here for a one month follow up appointment for stage T2a adenocarcinoma of the prostate with a Gleason's score of 4+3 and a PSA of 5.5.   PAIN: He is currently  Having pain in the right hip 2/10 taking Ibuprofen.  URINARY: He reports  urinary frequency and urinary urgency has experienced dysuria it comes and goes not everyday. Pt states he gets up to urinate 2-3   times per night.  Denies hematuria or problems emptying his bladder. BOWEL: He reports a  bowel movement everyday  normal bowel movements with gas. Fatigue:Having mild fatigue. Appetite:Good Weight: Wt Readings from Last 3 Encounters:  05/29/16 176 lb (79.8 kg)  04/16/16 170 lb (77.1 kg)  04/05/16 171 lb 12.8 oz (77.9 kg)   Urologist: Dr Shirley Muscat received two Lupron injection one by Dr Vallarie Mare at [facility] and the last one and Casidex by Dr. Vernie Shanks will made follow up appointment post radiation BP 136/83   Pulse 70   Temp 97.8 F (36.6 C) (Oral)   Resp 18   Ht 5\' 6"  (1.676 m)   Wt 176 lb (79.8 kg)   SpO2 99%   BMI 28.41 kg/m

## 2016-05-29 ENCOUNTER — Encounter: Payer: Self-pay | Admitting: Radiation Oncology

## 2016-05-29 ENCOUNTER — Ambulatory Visit
Admission: RE | Admit: 2016-05-29 | Discharge: 2016-05-29 | Disposition: A | Discharge status: Home / Self Care | Payer: Medicare Other | Source: Ambulatory Visit | Attending: Radiation Oncology | Admitting: Radiation Oncology

## 2016-05-29 VITALS — BP 136/83 | HR 70 | Temp 97.8°F | Resp 18 | Ht 66.0 in | Wt 176.0 lb

## 2016-05-29 DIAGNOSIS — C61 Malignant neoplasm of prostate: Secondary | ICD-10-CM | POA: Diagnosis not present

## 2016-05-29 DIAGNOSIS — M1990 Unspecified osteoarthritis, unspecified site: Secondary | ICD-10-CM

## 2016-05-30 ENCOUNTER — Telehealth: Payer: Self-pay | Admitting: *Deleted

## 2016-05-30 DIAGNOSIS — C61 Malignant neoplasm of prostate: Secondary | ICD-10-CM | POA: Diagnosis not present

## 2016-05-30 NOTE — Progress Notes (Signed)
  Radiation Oncology         [phone] ________________________________  Name: Jesse [name] MRN: [id number]  Date: 05/29/2016  DOB: 1940-07-03  Post Treatment Note  CC: No primary care provider on file.  Lyndon Code, MD  Diagnosis:   76 y.o. gentleman with stage T2a adenocarcinoma of the prostate with a Gleason's score of 4+3 and a PSA of 5.5     Interval Since Last Radiation:  6 weeks   02/21/16-04/16/16: The prostate was treated to 78 Gy in 40 fractions of 1.95 Gy  Narrative:  The patient returns today for routine follow-up. He tolerated radiotherapy well with modest fatigue and minor urinary complaints including nocturia 2. He has a urology appointment tomorrow for post radiotherapy follow up.  On review of systems, the patient states he's doing better in the last 2-3 weeks since completing his radiotherapy in terms of bowel and bladder function. He denies any chest pain, shortness of breath, fevers, or chills. He is planning to discuss further his lack of interest in any further lupron with his urologist. No other complaints are noted.   ALLERGIES:  has No Known Allergies.  Meds: Current Outpatient Prescriptions  Medication Sig Dispense Refill  . acetaminophen (TYLENOL) 500 MG tablet Take 500 mg by mouth.    Marland Kitchen glucosamine-chondroitin 500-400 MG tablet Take 1 tablet by mouth every morning.     Marland Kitchen ibuprofen (ADVIL,MOTRIN) 200 MG tablet Take 200 mg by mouth every 8 (eight) hours as needed.    Marland Kitchen levothyroxine (SYNTHROID, LEVOTHROID) 50 MCG tablet Take by mouth.     No current facility-administered medications for this encounter.     Physical Findings:  height is 5\' 6"  (1.676 m) and weight is 176 lb (79.8 kg). His oral temperature is 97.8 F (36.6 C). His blood pressure is 136/83 and his pulse is 70. His respiration is 18 and oxygen saturation is 99%.  In general this is a well appearing Caucasian male in no acute distress. He's alert and oriented x4 and appropriate throughout the  examination. Cardiopulmonary assessment is negative for acute distress and he exhibits normal effort.   Lab Findings: No results found for: WBC, HGB, HCT, MCV, PLT   Radiographic Findings: No results found.  Impression/Plan: 1. 76 y.o. gentleman with stage T2a adenocarcinoma of the prostate with a Gleason's score of 4+3 and a PSA of 5.5. The patient is continuing to notice improvement in his symptoms. He will return to urology tomorrow to discuss if there is any additional need for lupron injection, as I'm not sure if he received a 3 month injection or a 6 month injection previously. He will continue to be followed however in surveillance there, and return to radiation oncology as needed moving forward. He is encouraged to call if he has questions or concerns regarding his previous radiotherapy.     Carola Rhine, PAC

## 2016-05-30 NOTE — Telephone Encounter (Signed)
Called patient to inform that I have called Dr. [facility] to arrange his appt., was told by receptionist that the notes from [facility] would need to be reviewed and then his appt. would be scheduled, patient verified understanding this.

## 2016-05-31 DIAGNOSIS — M1611 Unilateral primary osteoarthritis, right hip: Secondary | ICD-10-CM | POA: Diagnosis not present

## 2016-06-05 ENCOUNTER — Encounter: Payer: Self-pay | Admitting: Podiatry

## 2016-06-05 ENCOUNTER — Ambulatory Visit (INDEPENDENT_AMBULATORY_CARE_PROVIDER_SITE_OTHER)
Admission: RE | Admit: 2016-06-05 | Discharge: 2016-06-05 | Disposition: A | Discharge status: Home / Self Care | Payer: Medicare Other | Source: Ambulatory Visit | Attending: Family Medicine | Admitting: Family Medicine

## 2016-06-05 ENCOUNTER — Ambulatory Visit (INDEPENDENT_AMBULATORY_CARE_PROVIDER_SITE_OTHER): Payer: Medicare Other | Admitting: Podiatry

## 2016-06-05 ENCOUNTER — Encounter: Payer: Self-pay | Admitting: Family Medicine

## 2016-06-05 ENCOUNTER — Ambulatory Visit (INDEPENDENT_AMBULATORY_CARE_PROVIDER_SITE_OTHER): Payer: Medicare Other | Admitting: Family Medicine

## 2016-06-05 ENCOUNTER — Ambulatory Visit: Payer: Medicare Other | Admitting: Sports Medicine

## 2016-06-05 VITALS — Resp 16 | Ht 71.0 in | Wt 169.0 lb

## 2016-06-05 VITALS — BP 120/80 | HR 78 | Resp 12 | Ht 71.0 in | Wt 173.4 lb

## 2016-06-05 DIAGNOSIS — Z01818 Encounter for other preprocedural examination: Secondary | ICD-10-CM

## 2016-06-05 DIAGNOSIS — M79676 Pain in unspecified toe(s): Secondary | ICD-10-CM

## 2016-06-05 DIAGNOSIS — I480 Paroxysmal atrial fibrillation: Secondary | ICD-10-CM | POA: Diagnosis not present

## 2016-06-05 DIAGNOSIS — L603 Nail dystrophy: Secondary | ICD-10-CM

## 2016-06-05 DIAGNOSIS — M1611 Unilateral primary osteoarthritis, right hip: Secondary | ICD-10-CM | POA: Diagnosis not present

## 2016-06-05 DIAGNOSIS — J449 Chronic obstructive pulmonary disease, unspecified: Secondary | ICD-10-CM | POA: Diagnosis not present

## 2016-06-05 DIAGNOSIS — E039 Hypothyroidism, unspecified: Secondary | ICD-10-CM

## 2016-06-05 DIAGNOSIS — B351 Tinea unguium: Secondary | ICD-10-CM | POA: Diagnosis not present

## 2016-06-05 LAB — CBC
HCT: 37.2 % — ABNORMAL LOW (ref 39.0–52.0)
Hemoglobin: 12.8 g/dL — ABNORMAL LOW (ref 13.0–17.0)
MCHC: 34.3 g/dL (ref 30.0–36.0)
MCV: 86.5 fl (ref 78.0–100.0)
Platelets: 256 10*3/uL (ref 150.0–400.0)
RBC: 4.3 Mil/uL (ref 4.22–5.81)
RDW: 12.8 % (ref 11.5–15.5)
WBC: 5.8 10*3/uL (ref 4.0–10.5)

## 2016-06-05 LAB — BASIC METABOLIC PANEL
BUN: 19 mg/dL (ref 6–23)
CO2: 27 mEq/L (ref 19–32)
Calcium: 9.3 mg/dL (ref 8.4–10.5)
Chloride: 102 mEq/L (ref 96–112)
Creatinine, Ser: 0.88 mg/dL (ref 0.40–1.50)
GFR: 89.34 mL/min (ref >60.00)
Glucose, Bld: 88 mg/dL (ref 70–99)
Potassium: 4.4 mEq/L (ref 3.5–5.1)
Sodium: 138 mEq/L (ref 135–145)

## 2016-06-05 LAB — T4, FREE: Free T4: 0.96 ng/dL (ref 0.60–1.60)

## 2016-06-05 LAB — TSH: TSH: 2.71 u[IU]/mL (ref 0.35–4.50)

## 2016-06-05 NOTE — Progress Notes (Signed)
HPI:   Jesse [name] is a 76 y.o. male, who is here today with his wife to establish care with me.    Concerns today: surgery clearance.  He moved from [place]. He is planning on undergoing right hip total replacement. He already had left hip replacement.  Consultation requested by Dr Lyla Glassing, ortho. Date of procedure has not been arranged.  Also requested dental clearance and pending.   Hx of generalized OA, right hip pain started getting worse since 2015. He did PT and helped some.   Pain is severe in the morning and alleviated by walking. Pain is also exacerbated by prolonged walking and standing.   He has had prior left hip replacement, cervical and lumbar laminectomy. Reporting episode of atrial fib after cervical laminectomy, stayed hospitalized for a week. He denies any other episode or irregular HR or palpitation. His wife is concerned about the amount of Ibuprofen he takes for pain as well as Tylenol.  He has Tramadol at home but does not take it frequently. He denies side effects and according to wife, 1 tab helps with pain all day.   No chest pain but some exertional dyspnea upon going up stairs, which he attributes to deconditioning. No other complication reported during surgical procedures or after. Cardiology evaluation in 2016 and according to wife,  EKG was done.   He lives with wife. Independent ADL's and IADL's.  No falls in the past year and denies depression symptoms.   He denies Hx of HTN,CAD,DM, or HLD. Hx of prostate cancer, completed radiation therapy. + Former smoker.   Hx of primary hypothyroidism, he is on Levothyroxine 50 mcg daily. He denies abnormal wt loss, constipation,diarrhea,cold/heat intolerance.  He is not sure about last time he had lab work.    Review of Systems  Constitutional: Negative for appetite change, fatigue, fever and unexpected weight change.  HENT: Negative for dental problem, mouth sores,  nosebleeds, sore throat and trouble swallowing.   Eyes: Negative for redness and visual disturbance.  Respiratory: Positive for shortness of breath. Negative for cough and wheezing.   Cardiovascular: Negative for chest pain, palpitations and leg swelling.  Gastrointestinal: Negative for abdominal pain, nausea and vomiting.       No changes in bowel habits.  Genitourinary: Negative for decreased urine volume and hematuria.  Musculoskeletal: Positive for arthralgias and gait problem.  Skin: Negative for rash.  Neurological: Negative for dizziness, syncope, weakness, numbness and headaches.  Psychiatric/Behavioral: Negative for confusion. The patient is not nervous/anxious.       Current Outpatient Prescriptions on File Prior to Visit  Medication Sig Dispense Refill  . acetaminophen (TYLENOL) 500 MG tablet Take 500 mg by mouth.    Marland Kitchen glucosamine-chondroitin 500-400 MG tablet Take 1 tablet by mouth every morning.     Marland Kitchen levothyroxine (SYNTHROID, LEVOTHROID) 50 MCG tablet Take by mouth.     No current facility-administered medications on file prior to visit.      Past Medical History:  Diagnosis Date  . Arthritis   . Atrial fibrillation ([place])   . Prostate cancer ([place])   . Thyroid disease    hypothyroidism  . Vitiligo    No Known Allergies  Family History  Problem Relation Age of Onset  . Atrial fibrillation Mother   . Atrial fibrillation Sister   . Cancer Neg Hx     Social History   Social History  . Marital status: Married    Spouse name: N/A  .  Number of children: N/A  . Years of education: N/A   Social History Main Topics  . Smoking status: Former Smoker    Types: Cigarettes    Quit date: 05/28/1974  . Smokeless tobacco: Never Used  . Alcohol use Yes     Comment: 4-5 standard drinks per week  . Drug use: No  . Sexual activity: Yes   Other Topics Concern  . None   Social History Narrative  . None    Vitals:   06/05/16 1309  BP: 120/80  Pulse: 78  Resp:  12   O2 sat 86% at RA.  Body mass index is 24.18 kg/m.   Physical Exam  Nursing note and vitals reviewed. Constitutional: He is oriented to person, place, and time. He appears well-developed and well-nourished. No distress.  HENT:  Head: Atraumatic.  Mouth/Throat: Oropharynx is clear and moist and mucous membranes are normal.  Eyes: Conjunctivae and EOM are normal. Pupils are equal, round, and reactive to light.  Neck: No JVD present. No thyroid mass and no thyromegaly present.  Cardiovascular: Normal rate and regular rhythm.   Occasional extrasystoles are present.  No murmur heard. Pulses:      Dorsalis pedis pulses are 2+ on the right side, and 2+ on the left side.  Respiratory: Effort normal and breath sounds normal. No respiratory distress.  GI: Soft. He exhibits no mass. There is no hepatomegaly. There is no tenderness.  Musculoskeletal: He exhibits no edema.       Thoracic back: He exhibits no tenderness.       Lumbar back: He exhibits no tenderness.  Pain elicited with movement of right hip, limitation ROM. Antalgic gait.  Lymphadenopathy:    He has no cervical adenopathy.  Neurological: He is alert and oriented to person, place, and time. He has normal strength. Coordination normal.  Skin: Skin is warm. No rash noted. No erythema.  Hypopigmented macular lesions scattered on body (Hx of vitiligo)  Psychiatric: He has a normal mood and affect. Cognition and memory are normal.  Well groomed, good eye contact.      ASSESSMENT AND PLAN:     Jesse [name] was seen today for establish care.  Diagnoses and all orders for this visit:  Lab Results  Component Value Date   WBC 5.8 06/05/2016   HGB 12.8 (L) 06/05/2016   HCT 37.2 (L) 06/05/2016   MCV 86.5 06/05/2016   PLT 256.0 06/05/2016   Lab Results  Component Value Date   CREATININE 0.88 06/05/2016   BUN 19 06/05/2016   NA 138 06/05/2016   K 4.4 06/05/2016   CL 102 06/05/2016   CO2 27 06/05/2016   Lab Results    Component Value Date   TSH 2.71 06/05/2016     Osteoarthritis of right hip, unspecified osteoarthritis type  We discussed some side effects of NSAID's, Acetaminophen, and Tramadol. Fall precautions. He has some Tramadol left ,will call for refills if he decides to continue it. Acetaminophen max dose 2600 mg daily. Avoid Ibuprofen.  Hypothyroidism, unspecified type  No changes in current management, will follow labs done today and will give further recommendations accordingly. F/U in 6-12 months.  -     TSH -     T4, free  Paroxysmal atrial fibrillation (HCC)  EKG done today, SR. Because reported some "little" dyspnea I recommend cardiac clearance with cardiologists.  -     EKG 12-Lead -     Ambulatory referral to Cardiology  Pre-op evaluation  We discussed  some possible complications from hip surgery. Early ambulation for DVT prophylaxis , anticoagulation needs to be considered. Adequate hydration, caution with opioid medications, delirium prevention. Labs done today and CXR order placed. Copy of this note will be sent to Dr Lyla Glassing. Pending cardiac and dental clearance.   -     Basic metabolic panel -     T4, free -     EKG 12-Lead -     CBC -     DG Chest 2 View; Future -     Ambulatory referral to Cardiology        Jesse Adan G. [place], MD  [facility]. [place] office.

## 2016-06-05 NOTE — Patient Instructions (Signed)
A few things to remember from today's visit:   Hypothyroidism, unspecified type - Plan: TSH, T4, free  Paroxysmal atrial fibrillation ([place]) - Plan: EKG 12-Lead  Pre-op evaluation - Plan: Basic metabolic panel, T4, free, EKG 12-Lead, CBC, DG Chest 2 View   Please be sure medication list is accurate. If a new problem present, please set up appointment sooner than planned today.

## 2016-06-05 NOTE — Progress Notes (Signed)
Pre visit review using our clinic review tool, if applicable. No additional management support is needed unless otherwise documented below in the visit note. 

## 2016-06-05 NOTE — Progress Notes (Signed)
   Subjective:    Patient ID: Jesse [name], male    DOB: March 28, 1941, 76 y.o.   MRN: [id number]  HPI: He presents today with chief complaint of painful elongated toenails.    Review of Systems  HENT: Positive for hearing loss.   Cardiovascular: Positive for leg swelling.  All other systems reviewed and are negative.      Objective:   Physical Exam: Vital signs are stable alert and oriented 3. Pulses are palpable. Nails are thick yellow dystrophic onychomycotic. Sharply incurvated and painful.        Assessment & Plan:  Pain and limb secondary to onychomycosis and onychocryptosis  Plan: Debridement of nails. Follow up with him in 3 months

## 2016-06-07 ENCOUNTER — Other Ambulatory Visit: Payer: Self-pay | Admitting: Family Medicine

## 2016-06-07 NOTE — Telephone Encounter (Signed)
Pt need new Tramadol one tablet a day  Pharm:  [facility] on [address]

## 2016-06-07 NOTE — Telephone Encounter (Signed)
Tramadol 50 mg to continue bid as needed can be called in, # 45/1 Thanks, BJ

## 2016-06-07 NOTE — Telephone Encounter (Signed)
Pt requesting Rx for Tramadol. Please advise.

## 2016-06-08 ENCOUNTER — Encounter: Payer: Self-pay | Admitting: Cardiovascular Disease

## 2016-06-08 ENCOUNTER — Ambulatory Visit (INDEPENDENT_AMBULATORY_CARE_PROVIDER_SITE_OTHER): Payer: Medicare Other | Admitting: Cardiovascular Disease

## 2016-06-08 ENCOUNTER — Telehealth: Payer: Self-pay | Admitting: Radiation Oncology

## 2016-06-08 ENCOUNTER — Ambulatory Visit: Payer: Self-pay | Admitting: Family Medicine

## 2016-06-08 VITALS — BP 110/70 | HR 72 | Ht 71.0 in | Wt 174.8 lb

## 2016-06-08 DIAGNOSIS — Z01810 Encounter for preprocedural cardiovascular examination: Secondary | ICD-10-CM

## 2016-06-08 DIAGNOSIS — I480 Paroxysmal atrial fibrillation: Secondary | ICD-10-CM | POA: Diagnosis not present

## 2016-06-08 DIAGNOSIS — E039 Hypothyroidism, unspecified: Secondary | ICD-10-CM | POA: Diagnosis not present

## 2016-06-08 MED ORDER — TRAMADOL HCL 50 MG PO TABS: 50.0000 mg | ORAL_TABLET | Freq: Two times a day (BID) | ORAL | 1 refills | Status: DC | PRN

## 2016-06-08 MED ORDER — TRAMADOL HCL 50 MG PO TABS: 50.0000 mg | ORAL_TABLET | Freq: Two times a day (BID) | ORAL | 0 refills | Status: DC | PRN

## 2016-06-08 NOTE — Telephone Encounter (Signed)
Left message on voicemail Rx was called into pharmacy as requested.

## 2016-06-08 NOTE — Telephone Encounter (Signed)
Returned call to patient's wife. Explained 04/13/16 PUT appointment with Dr. Lisbeth Renshaw shows cancelled and reflects her husband wasn't seen that day. She verbalized understanding and expressed appreciation for the return call.

## 2016-06-08 NOTE — Patient Instructions (Signed)
Dr Croitoru recommends that you schedule a follow-up appointment in 1 year. You will receive a reminder letter in the mail two months in advance. If you don't receive a letter, please call our office to schedule the follow-up appointment.  If you need a refill on your cardiac medications before your next appointment, please call your pharmacy. 

## 2016-06-08 NOTE — Progress Notes (Signed)
Cardiology Consultation Note    Date:  06/08/2016   ID:  Jesse [name], DOB 07-25-1940, MRN [id number]  PCP:  Betty Martinique, MD  Cardiologist:   Sanda Klein, MD  Consultation requested by: Dr. Lyla Glassing Chief Complaint  Patient presents with  . New Patient (Initial Visit)    preop evaluation for history of atrial fibrillation    History of Present Illness:  Jesse [name] is a 76 y.o. male with a remote history of a single episode of paroxysmal atrial fibrillation that occurred in the postoperative setting (cervical laminectomy 2013). He was briefly treated with amiodarone and anticoagulants but since no arrhythmia recurrence was detected he has stopped all cardiac medications. He does not have known structural heart disease. I don't have the records of his workup in 2013 (in [place]) but it sounds like he had at least an echocardiogram that was normal. He does not have a history of hypertension, diabetes, congestive heart failure, coronary artery or other vascular disorders.  He is planning to undergo lumbar spine surgery with Dr. Lyla Glassing and is here for preoperative evaluation. He had another echocardiogram performed on 06/05/2016 showing normal sinus rhythm, minor intraventricular conduction delay with an RSR prime pattern in lead V1, no repolarization abnormalities, normal QT interval.  He has a history of prostate TURP surgery as well as multiple orthopedic procedures (all of which occurred in 2013). Atrial fibrillation was not recorded around the time of his other surgical procedures. He recently completed radiation therapy for prostate carcinoma. He has treated hypothyroidism but does not have any other chronic medical problems.  He is currently experiencing a lot of pain in his back which limits his physical activity. He can control his symptoms with once a day tramadol. He is avoiding nonsteroidal anti-inflammatory drugs for the upcoming surgery.  Past Medical History:    Diagnosis Date  . Arthritis   . Atrial fibrillation ([place])   . Prostate cancer ([place])   . Thyroid disease    hypothyroidism  . Vitiligo     Past Surgical History:  Procedure Laterality Date  . LAMINECTOMY  10/2011   cervical and lumbar laminectomy  . PROSTATE BIOPSY  07/10/2011  . PROSTATE BIOPSY  09/22/15  . TOTAL HIP ARTHROPLASTY Right 02/29/2012    Current Medications: Outpatient Medications Prior to Visit  Medication Sig Dispense Refill  . acetaminophen (TYLENOL) 500 MG tablet Take 500 mg by mouth.    Marland Kitchen glucosamine-chondroitin 500-400 MG tablet Take 1 tablet by mouth every morning.     Marland Kitchen levothyroxine (SYNTHROID, LEVOTHROID) 50 MCG tablet Take by mouth.    . Multiple Vitamins-Minerals (MULTIVITAMIN ADULT PO) Take 1 tablet by mouth daily.    . traMADol (ULTRAM) 50 MG tablet Take 1 tablet (50 mg total) by mouth 2 (two) times daily as needed. 45 tablet 1   No facility-administered medications prior to visit.      Allergies:   Patient has no known allergies.   Social History   Social History  . Marital status: Married    Spouse name: N/A  . Number of children: N/A  . Years of education: N/A   Social History Main Topics  . Smoking status: Former Smoker    Types: Cigarettes    Quit date: 05/28/1974  . Smokeless tobacco: Never Used  . Alcohol use Yes     Comment: 4-5 standard drinks per week  . Drug use: No  . Sexual activity: Yes   Other Topics Concern  . None  Social History Narrative  . None     Family History:  The patient's family history includes Atrial fibrillation in his mother and sister.   ROS:   Please see the history of present illness.    ROS All other systems reviewed and are negative.   PHYSICAL EXAM:   VS:  BP 110/70   Pulse 72   Ht 5\' 11"  (1.803 m)   Wt 79.3 kg (174 lb 12.8 oz)   BMI 24.38 kg/m    GEN: Well nourished, well developed, in no acute distress  HEENT: normal  Neck: no JVD, carotid bruits, or masses Cardiac: RRR; no  murmurs, rubs, or gallops,no edema  Respiratory:  clear to auscultation bilaterally, normal work of breathing GI: soft, nontender, nondistended, + BS MS: no deformity or atrophy  Skin: warm and dry, no rash Neuro:  Alert and Oriented x 3, Strength and sensation are intact Psych: euthymic mood, full affect  Wt Readings from Last 3 Encounters:  06/08/16 79.3 kg (174 lb 12.8 oz)  06/05/16 78.6 kg (173 lb 6 oz)  06/05/16 76.7 kg (169 lb)      Studies/Labs Reviewed:   EKG:  EKG is not ordered today.  The ekg ordered 1/9/18demonstrates sinus rhythm, normal tracing with minor rSr' prime pattern in lead V1  Recent Labs: 06/05/2016: BUN 19; Creatinine, Ser 0.88; Hemoglobin 12.8; Platelets 256.0; Potassium 4.4; Sodium 138; TSH 2.71   Additional studies/ records that were reviewed today include:  Notes from Dr. Tammi Klippel and Dr. Lyla Glassing   ASSESSMENT:    1. Paroxysmal atrial fibrillation (HCC)   2. Acquired hypothyroidism   3. Preoperative cardiovascular examination      PLAN:  In order of problems listed above:  1. Hx of postop atrial fibrillation: This was a one-time event. It is possible that he could again develop atrial fibrillation postop, but I don't think that it would be justified to premedicate with antiarrhythmic medications in view of the infrequent and brief nature of his previous events. If atrial fibrillation is detected, he does meet criteria for long-term anticoagulation, but only due to his age (CHADSVasc score 2). Obviously, aspirin or anticoagulants should not be started before his spine surgery. 2. Hypothyroidism: On replacement therapy, TSH in target range. 3. Preop CV eval: The risk of major cardiovascular complications with the planned spine surgery is low.    Medication Adjustments/Labs and Tests Ordered: Current medicines are reviewed at length with the patient today.  Concerns regarding medicines are outlined above.  Medication changes, Labs and Tests ordered  today are listed in the Patient Instructions below. Patient Instructions  Dr Sallyanne Kuster recommends that you schedule a follow-up appointment in 1 year. You will receive a reminder letter in the mail two months in advance. If you don't receive a letter, please call our office to schedule the follow-up appointment.  If you need a refill on your cardiac medications before your next appointment, please call your pharmacy.    Signed, Sanda Klein, MD  06/08/2016 4:56 PM    [address], [place], [place]  [address] Phone: [phone]; Fax: [phone]

## 2016-06-09 ENCOUNTER — Encounter: Payer: Self-pay | Admitting: Radiation Oncology

## 2016-06-11 ENCOUNTER — Encounter: Payer: Self-pay | Admitting: Family Medicine

## 2016-06-11 DIAGNOSIS — L80 Vitiligo: Secondary | ICD-10-CM | POA: Insufficient documentation

## 2016-06-13 ENCOUNTER — Ambulatory Visit: Payer: Self-pay | Admitting: Orthopedic Surgery

## 2016-06-18 ENCOUNTER — Ambulatory Visit: Payer: Self-pay | Admitting: Family Medicine

## 2016-06-18 DIAGNOSIS — M1611 Unilateral primary osteoarthritis, right hip: Secondary | ICD-10-CM | POA: Diagnosis not present

## 2016-06-20 ENCOUNTER — Ambulatory Visit: Payer: Medicare Other | Admitting: Podiatry

## 2016-06-27 ENCOUNTER — Encounter (HOSPITAL_COMMUNITY): Payer: Self-pay

## 2016-06-27 ENCOUNTER — Ambulatory Visit: Payer: Self-pay | Admitting: Orthopedic Surgery

## 2016-06-27 ENCOUNTER — Encounter (HOSPITAL_COMMUNITY)
Admission: RE | Admit: 2016-06-27 | Discharge: 2016-06-27 | Disposition: A | Discharge status: Home / Self Care | Payer: Medicare Other | Source: Ambulatory Visit | Attending: Orthopedic Surgery | Admitting: Orthopedic Surgery

## 2016-06-27 ENCOUNTER — Other Ambulatory Visit (HOSPITAL_COMMUNITY): Payer: Self-pay | Admitting: *Deleted

## 2016-06-27 DIAGNOSIS — I4891 Unspecified atrial fibrillation: Secondary | ICD-10-CM | POA: Insufficient documentation

## 2016-06-27 DIAGNOSIS — Z87891 Personal history of nicotine dependence: Secondary | ICD-10-CM | POA: Insufficient documentation

## 2016-06-27 DIAGNOSIS — L80 Vitiligo: Secondary | ICD-10-CM | POA: Insufficient documentation

## 2016-06-27 DIAGNOSIS — Z8546 Personal history of malignant neoplasm of prostate: Secondary | ICD-10-CM | POA: Diagnosis not present

## 2016-06-27 DIAGNOSIS — E039 Hypothyroidism, unspecified: Secondary | ICD-10-CM | POA: Diagnosis not present

## 2016-06-27 DIAGNOSIS — M1611 Unilateral primary osteoarthritis, right hip: Secondary | ICD-10-CM | POA: Insufficient documentation

## 2016-06-27 DIAGNOSIS — Z01812 Encounter for preprocedural laboratory examination: Secondary | ICD-10-CM | POA: Insufficient documentation

## 2016-06-27 HISTORY — DX: Personal history of other diseases of the digestive system: Z87.19

## 2016-06-27 HISTORY — DX: Other complications of anesthesia, initial encounter: T88.59XA

## 2016-06-27 HISTORY — DX: Unspecified hearing loss, unspecified ear: H91.90

## 2016-06-27 HISTORY — DX: Adverse effect of unspecified anesthetic, initial encounter: T41.45XA

## 2016-06-27 HISTORY — DX: Restless legs syndrome: G25.81

## 2016-06-27 LAB — ABO/RH: ABO/RH(D): B POS

## 2016-06-27 LAB — CBC
HCT: 39.2 % (ref 39.0–52.0)
Hemoglobin: 13.3 g/dL (ref 13.0–17.0)
MCH: 28.9 pg (ref 26.0–34.0)
MCHC: 33.9 g/dL (ref 30.0–36.0)
MCV: 85.2 fL (ref 78.0–100.0)
Platelets: 192 10*3/uL (ref 150–400)
RBC: 4.6 MIL/uL (ref 4.22–5.81)
RDW: 12.9 % (ref 11.5–15.5)
WBC: 4.9 10*3/uL (ref 4.0–10.5)

## 2016-06-27 LAB — SURGICAL PCR SCREEN
MRSA, PCR: NEGATIVE
Staphylococcus aureus: NEGATIVE

## 2016-06-27 NOTE — H&P (Signed)
TOTAL HIP ADMISSION H&P  Patient is admitted for right total hip arthroplasty.  Subjective:  Chief Complaint: right hip pain  HPI: Jesse [name], 76 y.o. male, has a history of pain and functional disability in the right hip(s) due to arthritis and patient has failed non-surgical conservative treatments for greater than 12 weeks to include NSAID's and/or analgesics, flexibility and strengthening excercises, use of assistive devices, weight reduction as appropriate and activity modification.  Onset of symptoms was gradual starting 4 years ago with gradually worsening course since that time.The patient noted no past surgery on the right hip(s).  Patient currently rates pain in the right hip at 10 out of 10 with activity. Patient has night pain, worsening of pain with activity and weight bearing, pain that interfers with activities of daily living and pain with passive range of motion. Patient has evidence of subchondral cysts, subchondral sclerosis, periarticular osteophytes and joint space narrowing by imaging studies. This condition presents safety issues increasing the risk of falls. There is no current active infection.  Patient Active Problem List   Diagnosis Date Noted  . Vitiligo 06/11/2016  . Osteoarthritis of right hip 06/05/2016  . Malignant neoplasm of prostate ([place]) 12/07/2015  . Atrial fibrillation ([place]) 12/07/2015  . Hypothyroidism 12/07/2015   Past Medical History:  Diagnosis Date  . Arthritis   . Atrial fibrillation ([place])   . Complication of anesthesia    Atrial fibrillation during surgery   . Hard of hearing   . Hx of constipation   . Prostate cancer ([place])   . Restless legs   . Thyroid disease    hypothyroidism  . Vitiligo     Past Surgical History:  Procedure Laterality Date  . COLONOSCOPY    . LAMINECTOMY  10/2011   cervical and lumbar laminectomy  . PROSTATE BIOPSY  07/10/2011  . PROSTATE BIOPSY  09/22/15  . TOTAL HIP ARTHROPLASTY Right 02/29/2012  .  TRANSURETHRAL RESECTION OF PROSTATE       (Not in a hospital admission) No Known Allergies  Social History  Substance Use Topics  . Smoking status: Former Smoker    Types: Cigarettes    Quit date: 05/28/1974  . Smokeless tobacco: Never Used  . Alcohol use Yes     Comment: 4-5 standard drinks per week    Family History  Problem Relation Age of Onset  . Atrial fibrillation Mother   . Atrial fibrillation Sister   . Cancer Neg Hx      Review of Systems  Constitutional: Negative.   HENT: Positive for hearing loss.   Eyes: Negative.   Respiratory: Negative.   Cardiovascular: Negative.   Gastrointestinal: Negative.   Genitourinary: Negative.   Musculoskeletal: Positive for joint pain.  Skin: Negative.   Neurological: Negative.   Endo/Heme/Allergies: Negative.   Psychiatric/Behavioral: Negative.     Objective:  Physical Exam  Vitals reviewed. Constitutional: He is oriented to person, place, and time. He appears well-developed and well-nourished.  HENT:  Head: Normocephalic and atraumatic.  Eyes: Conjunctivae and EOM are normal. Pupils are equal, round, and reactive to light.  Neck: Normal range of motion. Neck supple.  Cardiovascular: Normal rate, regular rhythm and intact distal pulses.   Respiratory: Effort normal. No respiratory distress.  GI: Soft. He exhibits distension.  Genitourinary:  Genitourinary Comments: deferred  Musculoskeletal:       Right hip: He exhibits decreased range of motion.  Neurological: He is alert and oriented to person, place, and time. He has normal reflexes.  Skin: Skin is warm and dry.  Psychiatric: He has a normal mood and affect. His behavior is normal. Judgment and thought content normal.    Vital signs in last 24 hours: @VSRANGES @  Labs:   Estimated body mass index is 24.48 kg/m as calculated from the following:   Height as of an earlier encounter on 06/27/16: 5\' 11"  (1.803 m).   Weight as of an earlier encounter on 06/27/16:  79.6 kg (175 lb 8 oz).   Imaging Review Plain radiographs demonstrate severe degenerative joint disease of the right hip(s). The bone quality appears to be adequate for age and reported activity level.  Assessment/Plan:  End stage arthritis, right hip(s)  The patient history, physical examination, clinical judgement of the provider and imaging studies are consistent with end stage degenerative joint disease of the right hip(s) and total hip arthroplasty is deemed medically necessary. The treatment options including medical management, injection therapy, arthroscopy and arthroplasty were discussed at length. The risks and benefits of total hip arthroplasty were presented and reviewed. The risks due to aseptic loosening, infection, stiffness, dislocation/subluxation,  thromboembolic complications and other imponderables were discussed.  The patient acknowledged the explanation, agreed to proceed with the plan and consent was signed. Patient is being admitted for inpatient treatment for surgery, pain control, PT, OT, prophylactic antibiotics, VTE prophylaxis, progressive ambulation and ADL's and discharge planning.The patient is planning to be discharged with HEP

## 2016-06-27 NOTE — Pre-Procedure Instructions (Signed)
Jesse [name]  06/27/2016    Your procedure is scheduled on  Wednesday, July 04, 2016 at 3:15 PM.   Report to [facility] Entrance "A" Admitting Office at 1:15 PM.   Call this number if you have problems the morning of surgery: [phone]   Questions prior to day of surgery, please call [phone] between 8 & 4 PM.   Remember:  Do not eat food or drink liquids after midnight Tuesday, 07/03/16.  Take these medicines the morning of surgery with A SIP OF WATER: Levothyroxine (Synthroid), Tramadol or Tylenol - if needed  Stop Multivitamins and Herbal medications 5 days prior to surgery   Do not wear jewelry.  Do not wear lotions, powders, or cologne.  Men may shave face and neck.  Do not bring valuables to the hospital.  [facility] is not responsible for any belongings or valuables.  Contacts, dentures or bridgework may not be worn into surgery.  Leave your suitcase in the car.  After surgery it may be brought to your room.  For patients admitted to the hospital, discharge time will be determined by your treatment team.  Special instructions:  [facility] - Preparing for Surgery  Before surgery, you can play an important role.  Because skin is not sterile, your skin needs to be as free of germs as possible.  You can reduce the number of germs on you skin by washing with CHG (chlorahexidine gluconate) soap before surgery.  CHG is an antiseptic cleaner which kills germs and bonds with the skin to continue killing germs even after washing.  Please DO NOT use if you have an allergy to CHG or antibacterial soaps.  If your skin becomes reddened/irritated stop using the CHG and inform your nurse when you arrive at Short Stay.  Do not shave (including legs and underarms) for at least 48 hours prior to the first CHG shower.  You may shave your face.  Please follow these instructions carefully:   1.  Shower with CHG Soap the night before surgery and the                     morning of Surgery.  2.  If you choose to wash your hair, wash your hair first as usual with your       normal shampoo.  3.  After you shampoo, rinse your hair and body thoroughly to remove the shampoo.  4.  Use CHG as you would any other liquid soap.  You can apply chg directly       to the skin and wash gently with scrungie or a clean washcloth.  5.  Apply the CHG Soap to your body ONLY FROM THE NECK DOWN.        Do not use on open wounds or open sores.  Avoid contact with your eyes, ears, mouth and genitals (private parts).  Wash genitals (private parts) with your normal soap.  6.  Wash thoroughly, paying special attention to the area where your surgery        will be performed.  7.  Thoroughly rinse your body with warm water from the neck down.  8.  DO NOT shower/wash with your normal soap after using and rinsing off       the CHG Soap.  9.  Pat yourself dry with a clean towel.            10.  Wear clean pajamas.  11.  Place clean sheets on your bed the night of your first shower and do not        sleep with pets.  Day of Surgery  Do not apply any lotions the morning of surgery.  Please wear clean clothes to the hospital.  Please read over the fact sheets that you were given.

## 2016-06-27 NOTE — Progress Notes (Signed)
Pt and wife states the only cardiac history pt has is Atrial Fibrillation back in 2013 during hip replacement surgery. He states he was on medication for a short period of time and then told he didn't need it any longer. Have requested copy of Echo and ?stress test from [facility] in [place] from 2013. Pt has seen Dr. Sallyanne Kuster this month and has cardiac clearance. He denies any chest pain, states he has sob with exertion at times.

## 2016-06-28 LAB — TYPE AND SCREEN
ABO/RH(D): B POS
Antibody Screen: NEGATIVE

## 2016-06-28 NOTE — Progress Notes (Signed)
Anesthesia Chart Review:  Pt is a 76 year old male scheduled for R total hip arthroplasty anterior approach on 07/04/2016 with Rod Can, M.D.  - PCP is Betty Martinique, MD - Pt saw cardiologist Sanda Klein, MD for pre-op eval on 06/08/16 and was cleared for surgery.   PMH includes:  Post-op atrial fibrillation (2013 in [place]), hypothyroidism, prostate cancer. Hard of hearing. Former smoker. BMI 24.5.  Medications include: Levothyroxine.  Preoperative labs reviewed.  CXR 06/05/16: COPD. There is no pneumonia, CHF, nor other acute cardiopulmonary abnormality.  EKG 06/05/16: Sinus  Rhythm. RSR(V1) -nondiagnostic.   Pt reports having an echo at the time of his post-op afib in 2013. I have attempted to locate that result but was unsuccessful.   If no changes, I anticipate pt can proceed with surgery as scheduled.   Willeen Cass, FNP-BC [facility]/Anesthesiology Phone: [phone] 06/28/2016 3:38 PM

## 2016-07-03 MED ORDER — SODIUM CHLORIDE 0.9 % IV SOLN
1000.0000 mg | INTRAVENOUS | Status: AC
  Administered 2016-07-04: 1000 mg via INTRAVENOUS
  Filled 2016-07-03: qty 10

## 2016-07-04 ENCOUNTER — Inpatient Hospital Stay (HOSPITAL_COMMUNITY): Payer: Medicare Other

## 2016-07-04 ENCOUNTER — Inpatient Hospital Stay (HOSPITAL_COMMUNITY): Payer: Medicare Other | Admitting: Certified Registered"

## 2016-07-04 ENCOUNTER — Encounter (HOSPITAL_COMMUNITY): Payer: Self-pay | Admitting: Certified Registered"

## 2016-07-04 ENCOUNTER — Inpatient Hospital Stay (HOSPITAL_COMMUNITY)
Admission: RE | Admit: 2016-07-04 | Discharge: 2016-07-06 | DRG: 470 | Disposition: A | Discharge status: Home / Self Care | Payer: Medicare Other | Source: Ambulatory Visit | Attending: Orthopedic Surgery | Admitting: Orthopedic Surgery

## 2016-07-04 ENCOUNTER — Encounter (HOSPITAL_COMMUNITY)
Admission: RE | Disposition: A | Discharge status: Home / Self Care | Payer: Self-pay | Source: Ambulatory Visit | Attending: Orthopedic Surgery

## 2016-07-04 ENCOUNTER — Inpatient Hospital Stay (HOSPITAL_COMMUNITY): Payer: Medicare Other | Admitting: Emergency Medicine

## 2016-07-04 DIAGNOSIS — E039 Hypothyroidism, unspecified: Secondary | ICD-10-CM | POA: Diagnosis present

## 2016-07-04 DIAGNOSIS — M1611 Unilateral primary osteoarthritis, right hip: Secondary | ICD-10-CM | POA: Diagnosis not present

## 2016-07-04 DIAGNOSIS — H9190 Unspecified hearing loss, unspecified ear: Secondary | ICD-10-CM | POA: Diagnosis not present

## 2016-07-04 DIAGNOSIS — Z419 Encounter for procedure for purposes other than remedying health state, unspecified: Secondary | ICD-10-CM

## 2016-07-04 DIAGNOSIS — Z96641 Presence of right artificial hip joint: Secondary | ICD-10-CM | POA: Diagnosis not present

## 2016-07-04 DIAGNOSIS — G2581 Restless legs syndrome: Secondary | ICD-10-CM | POA: Diagnosis present

## 2016-07-04 DIAGNOSIS — Z8249 Family history of ischemic heart disease and other diseases of the circulatory system: Secondary | ICD-10-CM

## 2016-07-04 DIAGNOSIS — Z96642 Presence of left artificial hip joint: Secondary | ICD-10-CM | POA: Diagnosis present

## 2016-07-04 DIAGNOSIS — I4891 Unspecified atrial fibrillation: Secondary | ICD-10-CM | POA: Diagnosis present

## 2016-07-04 DIAGNOSIS — Z8546 Personal history of malignant neoplasm of prostate: Secondary | ICD-10-CM | POA: Diagnosis not present

## 2016-07-04 DIAGNOSIS — Z87891 Personal history of nicotine dependence: Secondary | ICD-10-CM | POA: Diagnosis not present

## 2016-07-04 DIAGNOSIS — L80 Vitiligo: Secondary | ICD-10-CM | POA: Diagnosis present

## 2016-07-04 DIAGNOSIS — M25751 Osteophyte, right hip: Secondary | ICD-10-CM | POA: Diagnosis not present

## 2016-07-04 DIAGNOSIS — Z09 Encounter for follow-up examination after completed treatment for conditions other than malignant neoplasm: Secondary | ICD-10-CM

## 2016-07-04 DIAGNOSIS — Z471 Aftercare following joint replacement surgery: Secondary | ICD-10-CM | POA: Diagnosis not present

## 2016-07-04 DIAGNOSIS — M25551 Pain in right hip: Secondary | ICD-10-CM | POA: Diagnosis not present

## 2016-07-04 HISTORY — PX: TOTAL HIP ARTHROPLASTY: SHX124

## 2016-07-04 SURGERY — ARTHROPLASTY, HIP, TOTAL, ANTERIOR APPROACH
Anesthesia: Monitor Anesthesia Care | Site: Hip | Laterality: Right

## 2016-07-04 MED ORDER — SODIUM CHLORIDE 0.9 % IV SOLN: INTRAVENOUS | Status: DC

## 2016-07-04 MED ORDER — LACTATED RINGERS IV SOLN
INTRAVENOUS | Status: DC
  Administered 2016-07-04 (×2): via INTRAVENOUS

## 2016-07-04 MED ORDER — PROPOFOL 10 MG/ML IV BOLUS
INTRAVENOUS | Status: AC
  Filled 2016-07-04: qty 20

## 2016-07-04 MED ORDER — CHLORHEXIDINE GLUCONATE 4 % EX LIQD: 60.0000 mL | Freq: Once | CUTANEOUS | Status: DC

## 2016-07-04 MED ORDER — CEFAZOLIN SODIUM-DEXTROSE 2-4 GM/100ML-% IV SOLN
2.0000 g | Freq: Four times a day (QID) | INTRAVENOUS | Status: AC
  Administered 2016-07-05: 2 g via INTRAVENOUS
  Filled 2016-07-04 (×3): qty 100

## 2016-07-04 MED ORDER — OXYCODONE HCL 5 MG/5ML PO SOLN: 5.0000 mg | Freq: Once | ORAL | Status: DC | PRN

## 2016-07-04 MED ORDER — ACETAMINOPHEN 10 MG/ML IV SOLN
1000.0000 mg | INTRAVENOUS | Status: AC
  Administered 2016-07-04: 1000 mg via INTRAVENOUS
  Filled 2016-07-04: qty 100

## 2016-07-04 MED ORDER — ONDANSETRON HCL 4 MG/2ML IJ SOLN
4.0000 mg | Freq: Four times a day (QID) | INTRAMUSCULAR | Status: DC | PRN
  Administered 2016-07-04: 4 mg via INTRAVENOUS
  Filled 2016-07-04: qty 2

## 2016-07-04 MED ORDER — METHOCARBAMOL 500 MG PO TABS
500.0000 mg | ORAL_TABLET | Freq: Four times a day (QID) | ORAL | Status: DC | PRN
  Administered 2016-07-04 – 2016-07-06 (×3): 500 mg via ORAL
  Filled 2016-07-04 (×3): qty 1

## 2016-07-04 MED ORDER — EPHEDRINE SULFATE-NACL 50-0.9 MG/10ML-% IV SOSY
PREFILLED_SYRINGE | INTRAVENOUS | Status: DC | PRN
  Administered 2016-07-04: 5 mg via INTRAVENOUS

## 2016-07-04 MED ORDER — HYDROMORPHONE HCL 2 MG/ML IJ SOLN
0.5000 mg | INTRAMUSCULAR | Status: DC | PRN
  Administered 2016-07-04: 0.5 mg via INTRAVENOUS
  Administered 2016-07-05: 1 mg via INTRAVENOUS
  Filled 2016-07-04 (×3): qty 1

## 2016-07-04 MED ORDER — FENTANYL CITRATE (PF) 100 MCG/2ML IJ SOLN
INTRAMUSCULAR | Status: DC | PRN
  Administered 2016-07-04: 100 ug via INTRAVENOUS

## 2016-07-04 MED ORDER — DEXAMETHASONE SODIUM PHOSPHATE 10 MG/ML IJ SOLN
10.0000 mg | Freq: Once | INTRAMUSCULAR | Status: AC
  Administered 2016-07-05: 10 mg via INTRAVENOUS
  Filled 2016-07-04: qty 1

## 2016-07-04 MED ORDER — DOCUSATE SODIUM 100 MG PO CAPS
100.0000 mg | ORAL_CAPSULE | Freq: Two times a day (BID) | ORAL | Status: DC
  Administered 2016-07-04 – 2016-07-06 (×4): 100 mg via ORAL
  Filled 2016-07-04 (×4): qty 1

## 2016-07-04 MED ORDER — BUPIVACAINE-EPINEPHRINE (PF) 0.5% -1:200000 IJ SOLN
INTRAMUSCULAR | Status: AC
  Filled 2016-07-04: qty 30

## 2016-07-04 MED ORDER — BUPIVACAINE-EPINEPHRINE 0.5% -1:200000 IJ SOLN
INTRAMUSCULAR | Status: DC | PRN
  Administered 2016-07-04: 30 mL

## 2016-07-04 MED ORDER — MIDAZOLAM HCL 2 MG/2ML IJ SOLN
INTRAMUSCULAR | Status: AC
  Filled 2016-07-04: qty 2

## 2016-07-04 MED ORDER — 0.9 % SODIUM CHLORIDE (POUR BTL) OPTIME
TOPICAL | Status: DC | PRN
  Administered 2016-07-04: 1000 mL

## 2016-07-04 MED ORDER — SODIUM CHLORIDE 0.9 % IJ SOLN
INTRAMUSCULAR | Status: DC | PRN
  Administered 2016-07-04: 10 mL

## 2016-07-04 MED ORDER — HYDROMORPHONE HCL 1 MG/ML IJ SOLN: 0.2500 mg | INTRAMUSCULAR | Status: DC | PRN

## 2016-07-04 MED ORDER — CEFAZOLIN SODIUM-DEXTROSE 2-4 GM/100ML-% IV SOLN
2.0000 g | INTRAVENOUS | Status: AC
  Administered 2016-07-04: 2 g via INTRAVENOUS
  Filled 2016-07-04: qty 100

## 2016-07-04 MED ORDER — PHENYLEPHRINE 40 MCG/ML (10ML) SYRINGE FOR IV PUSH (FOR BLOOD PRESSURE SUPPORT)
PREFILLED_SYRINGE | INTRAVENOUS | Status: DC | PRN
  Administered 2016-07-04: 80 ug via INTRAVENOUS

## 2016-07-04 MED ORDER — PROPOFOL 500 MG/50ML IV EMUL
INTRAVENOUS | Status: DC | PRN
  Administered 2016-07-04: 50 ug/kg/min via INTRAVENOUS

## 2016-07-04 MED ORDER — KETOROLAC TROMETHAMINE 15 MG/ML IJ SOLN
7.5000 mg | Freq: Four times a day (QID) | INTRAMUSCULAR | Status: AC
  Administered 2016-07-04 – 2016-07-05 (×3): 7.5 mg via INTRAVENOUS
  Filled 2016-07-04 (×3): qty 1

## 2016-07-04 MED ORDER — MIDAZOLAM HCL 5 MG/5ML IJ SOLN
INTRAMUSCULAR | Status: DC | PRN
  Administered 2016-07-04: 2 mg via INTRAVENOUS

## 2016-07-04 MED ORDER — LEVOTHYROXINE SODIUM 50 MCG PO TABS
50.0000 ug | ORAL_TABLET | Freq: Every day | ORAL | Status: DC
  Administered 2016-07-05 – 2016-07-06 (×2): 50 ug via ORAL
  Filled 2016-07-04 (×2): qty 1

## 2016-07-04 MED ORDER — PHENYLEPHRINE 40 MCG/ML (10ML) SYRINGE FOR IV PUSH (FOR BLOOD PRESSURE SUPPORT)
PREFILLED_SYRINGE | INTRAVENOUS | Status: AC
  Filled 2016-07-04: qty 10

## 2016-07-04 MED ORDER — KETOROLAC TROMETHAMINE 30 MG/ML IJ SOLN
INTRAMUSCULAR | Status: DC | PRN
  Administered 2016-07-04: 30 mg

## 2016-07-04 MED ORDER — ONDANSETRON HCL 4 MG PO TABS: 4.0000 mg | ORAL_TABLET | Freq: Four times a day (QID) | ORAL | Status: DC | PRN

## 2016-07-04 MED ORDER — METHOCARBAMOL 1000 MG/10ML IJ SOLN
500.0000 mg | Freq: Four times a day (QID) | INTRAMUSCULAR | Status: DC | PRN
  Filled 2016-07-04: qty 5

## 2016-07-04 MED ORDER — SENNA 8.6 MG PO TABS
2.0000 | ORAL_TABLET | Freq: Every day | ORAL | Status: DC
  Administered 2016-07-05: 17.2 mg via ORAL
  Filled 2016-07-04 (×2): qty 2

## 2016-07-04 MED ORDER — POVIDONE-IODINE 10 % EX SWAB: 2.0000 "application " | Freq: Once | CUTANEOUS | Status: DC

## 2016-07-04 MED ORDER — PHENOL 1.4 % MT LIQD: 1.0000 | OROMUCOSAL | Status: DC | PRN

## 2016-07-04 MED ORDER — HYDROCODONE-ACETAMINOPHEN 5-325 MG PO TABS
1.0000 | ORAL_TABLET | ORAL | Status: DC | PRN
  Administered 2016-07-05 – 2016-07-06 (×5): 2 via ORAL
  Filled 2016-07-04 (×6): qty 2

## 2016-07-04 MED ORDER — DIPHENHYDRAMINE HCL 12.5 MG/5ML PO ELIX: 12.5000 mg | ORAL_SOLUTION | ORAL | Status: DC | PRN

## 2016-07-04 MED ORDER — METOCLOPRAMIDE HCL 5 MG/ML IJ SOLN: 5.0000 mg | Freq: Three times a day (TID) | INTRAMUSCULAR | Status: DC | PRN

## 2016-07-04 MED ORDER — KETOROLAC TROMETHAMINE 30 MG/ML IJ SOLN
INTRAMUSCULAR | Status: AC
  Filled 2016-07-04: qty 1

## 2016-07-04 MED ORDER — DEXTROSE 5 % IV SOLN
INTRAVENOUS | Status: DC | PRN
  Administered 2016-07-04: 30 ug/min via INTRAVENOUS

## 2016-07-04 MED ORDER — ACETAMINOPHEN 650 MG RE SUPP: 650.0000 mg | Freq: Four times a day (QID) | RECTAL | Status: DC | PRN

## 2016-07-04 MED ORDER — MENTHOL 3 MG MT LOZG: 1.0000 | LOZENGE | OROMUCOSAL | Status: DC | PRN

## 2016-07-04 MED ORDER — FENTANYL CITRATE (PF) 100 MCG/2ML IJ SOLN
INTRAMUSCULAR | Status: AC
  Filled 2016-07-04: qty 2

## 2016-07-04 MED ORDER — ACETAMINOPHEN 325 MG PO TABS: 650.0000 mg | ORAL_TABLET | Freq: Four times a day (QID) | ORAL | Status: DC | PRN

## 2016-07-04 MED ORDER — SODIUM CHLORIDE 0.9 % IV SOLN
INTRAVENOUS | Status: DC | PRN
  Administered 2016-07-04: 1000 mL

## 2016-07-04 MED ORDER — BUPIVACAINE HCL (PF) 0.5 % IJ SOLN
INTRAMUSCULAR | Status: DC | PRN
  Administered 2016-07-04: 3 mL via INTRATHECAL

## 2016-07-04 MED ORDER — TRANEXAMIC ACID 1000 MG/10ML IV SOLN
1000.0000 mg | Freq: Once | INTRAVENOUS | Status: AC
  Administered 2016-07-04: 1000 mg via INTRAVENOUS
  Filled 2016-07-04: qty 10

## 2016-07-04 MED ORDER — ADULT MULTIVITAMIN W/MINERALS CH
1.0000 | ORAL_TABLET | Freq: Every day | ORAL | Status: DC
  Administered 2016-07-05 – 2016-07-06 (×2): 1 via ORAL
  Filled 2016-07-04 (×2): qty 1

## 2016-07-04 MED ORDER — ASPIRIN 81 MG PO CHEW
81.0000 mg | CHEWABLE_TABLET | Freq: Two times a day (BID) | ORAL | Status: DC
  Administered 2016-07-04 – 2016-07-06 (×4): 81 mg via ORAL
  Filled 2016-07-04 (×4): qty 1

## 2016-07-04 MED ORDER — OXYCODONE HCL 5 MG PO TABS: 5.0000 mg | ORAL_TABLET | Freq: Once | ORAL | Status: DC | PRN

## 2016-07-04 MED ORDER — METOCLOPRAMIDE HCL 5 MG PO TABS: 5.0000 mg | ORAL_TABLET | Freq: Three times a day (TID) | ORAL | Status: DC | PRN

## 2016-07-04 MED ORDER — SODIUM CHLORIDE 0.9 % IR SOLN
Status: DC | PRN
  Administered 2016-07-04: 3000 mL

## 2016-07-04 SURGICAL SUPPLY — 53 items
ALCOHOL ISOPROPYL (RUBBING) (MISCELLANEOUS) ×3 IMPLANT
BLADE SURG ROTATE 9660 (MISCELLANEOUS) IMPLANT
CAPT HIP TOTAL 2 ×3 IMPLANT
CHLORAPREP W/TINT 26ML (MISCELLANEOUS) ×3 IMPLANT
COVER SURGICAL LIGHT HANDLE (MISCELLANEOUS) ×3 IMPLANT
DERMABOND ADVANCED (GAUZE/BANDAGES/DRESSINGS) ×2
DERMABOND ADVANCED .7 DNX12 (GAUZE/BANDAGES/DRESSINGS) ×1 IMPLANT
DRAPE C-ARM 42X72 X-RAY (DRAPES) ×3 IMPLANT
DRAPE STERI IOBAN 125X83 (DRAPES) ×3 IMPLANT
DRAPE U-SHAPE 47X51 STRL (DRAPES) ×9 IMPLANT
DRSG AQUACEL AG ADV 3.5X10 (GAUZE/BANDAGES/DRESSINGS) ×3 IMPLANT
ELECT BLADE 4.0 EZ CLEAN MEGAD (MISCELLANEOUS) ×3
ELECT REM PT RETURN 9FT ADLT (ELECTROSURGICAL) ×3
ELECTRODE BLDE 4.0 EZ CLN MEGD (MISCELLANEOUS) ×1 IMPLANT
ELECTRODE REM PT RTRN 9FT ADLT (ELECTROSURGICAL) ×1 IMPLANT
EVACUATOR 1/8 PVC DRAIN (DRAIN) IMPLANT
GLOVE BIO SURGEON STRL SZ8.5 (GLOVE) ×6 IMPLANT
GLOVE BIOGEL PI IND STRL 8.5 (GLOVE) ×1 IMPLANT
GLOVE BIOGEL PI INDICATOR 8.5 (GLOVE) ×2
GOWN STRL REUS W/ TWL LRG LVL3 (GOWN DISPOSABLE) ×2 IMPLANT
GOWN STRL REUS W/TWL 2XL LVL3 (GOWN DISPOSABLE) ×3 IMPLANT
GOWN STRL REUS W/TWL LRG LVL3 (GOWN DISPOSABLE) ×4
HANDPIECE INTERPULSE COAX TIP (DISPOSABLE) ×2
HOOD PEEL AWAY FACE SHEILD DIS (HOOD) ×6 IMPLANT
KIT BASIN OR (CUSTOM PROCEDURE TRAY) ×3 IMPLANT
KIT ROOM TURNOVER OR (KITS) ×3 IMPLANT
MANIFOLD NEPTUNE II (INSTRUMENTS) ×3 IMPLANT
MARKER SKIN DUAL TIP RULER LAB (MISCELLANEOUS) ×6 IMPLANT
NEEDLE SPNL 18GX3.5 QUINCKE PK (NEEDLE) ×3 IMPLANT
NS IRRIG 1000ML POUR BTL (IV SOLUTION) ×3 IMPLANT
PACK TOTAL JOINT (CUSTOM PROCEDURE TRAY) ×3 IMPLANT
PACK UNIVERSAL I (CUSTOM PROCEDURE TRAY) ×3 IMPLANT
PAD ARMBOARD 7.5X6 YLW CONV (MISCELLANEOUS) ×6 IMPLANT
SAW OSC TIP CART 19.5X105X1.3 (SAW) ×3 IMPLANT
SEALER BIPOLAR AQUA 6.0 (INSTRUMENTS) IMPLANT
SET HNDPC FAN SPRY TIP SCT (DISPOSABLE) ×1 IMPLANT
SOLUTION BETADINE 4OZ (MISCELLANEOUS) ×3 IMPLANT
SUT ETHIBOND NAB CT1 #1 30IN (SUTURE) ×6 IMPLANT
SUT MNCRL AB 3-0 PS2 18 (SUTURE) ×3 IMPLANT
SUT MON AB 2-0 CT1 36 (SUTURE) ×3 IMPLANT
SUT STRATAFIX 0 PDS 27 VIOLET (SUTURE) ×3
SUT VIC AB 1 CT1 27 (SUTURE) ×2
SUT VIC AB 1 CT1 27XBRD ANBCTR (SUTURE) ×1 IMPLANT
SUT VIC AB 2-0 CT1 27 (SUTURE) ×2
SUT VIC AB 2-0 CT1 TAPERPNT 27 (SUTURE) ×1 IMPLANT
SUT VLOC 180 0 24IN GS25 (SUTURE) ×3 IMPLANT
SUTURE STRATFX 0 PDS 27 VIOLET (SUTURE) ×1 IMPLANT
SYR 50ML LL SCALE MARK (SYRINGE) ×3 IMPLANT
TOWEL OR 17X24 6PK STRL BLUE (TOWEL DISPOSABLE) ×3 IMPLANT
TOWEL OR 17X26 10 PK STRL BLUE (TOWEL DISPOSABLE) ×3 IMPLANT
TRAY CATH 16FR W/PLASTIC CATH (SET/KITS/TRAYS/PACK) IMPLANT
TRAY FOLEY CATH 16FR SILVER (SET/KITS/TRAYS/PACK) IMPLANT
WATER STERILE IRR 1000ML POUR (IV SOLUTION) ×9 IMPLANT

## 2016-07-04 NOTE — Anesthesia Preprocedure Evaluation (Signed)
Anesthesia Evaluation  Patient identified by MRN, date of birth, ID band Patient awake    Reviewed: Allergy & Precautions, H&P , NPO status , Patient's Chart, lab work & pertinent test results  Airway Mallampati: II   Neck ROM: full    Dental   Pulmonary former smoker,    breath sounds clear to auscultation       Cardiovascular negative cardio ROS   Rhythm:regular Rate:Normal  Had an episode of AF with prior surgery.  NSR at baseline.   Neuro/Psych    GI/Hepatic   Endo/Other  Hypothyroidism   Renal/GU      Musculoskeletal  (+) Arthritis ,   Abdominal   Peds  Hematology   Anesthesia Other Findings   Reproductive/Obstetrics                             Anesthesia Physical Anesthesia Plan  ASA: II  Anesthesia Plan: Spinal   Post-op Pain Management:    Induction: Intravenous  Airway Management Planned: Simple Face Mask  Additional Equipment:   Intra-op Plan:   Post-operative Plan:   Informed Consent: I have reviewed the patients History and Physical, chart, labs and discussed the procedure including the risks, benefits and alternatives for the proposed anesthesia with the patient or authorized representative who has indicated his/her understanding and acceptance.     Plan Discussed with: CRNA, Anesthesiologist and Surgeon  Anesthesia Plan Comments:         Anesthesia Quick Evaluation

## 2016-07-04 NOTE — H&P (View-Only) (Signed)
TOTAL HIP ADMISSION H&P  Patient is admitted for right total hip arthroplasty.  Subjective:  Chief Complaint: right hip pain  HPI: Jesse [name], 76 y.o. male, has a history of pain and functional disability in the right hip(s) due to arthritis and patient has failed non-surgical conservative treatments for greater than 12 weeks to include NSAID's and/or analgesics, flexibility and strengthening excercises, use of assistive devices, weight reduction as appropriate and activity modification.  Onset of symptoms was gradual starting 4 years ago with gradually worsening course since that time.The patient noted no past surgery on the right hip(s).  Patient currently rates pain in the right hip at 10 out of 10 with activity. Patient has night pain, worsening of pain with activity and weight bearing, pain that interfers with activities of daily living and pain with passive range of motion. Patient has evidence of subchondral cysts, subchondral sclerosis, periarticular osteophytes and joint space narrowing by imaging studies. This condition presents safety issues increasing the risk of falls. There is no current active infection.  Patient Active Problem List   Diagnosis Date Noted  . Vitiligo 06/11/2016  . Osteoarthritis of right hip 06/05/2016  . Malignant neoplasm of prostate ([place]) 12/07/2015  . Atrial fibrillation ([place]) 12/07/2015  . Hypothyroidism 12/07/2015   Past Medical History:  Diagnosis Date  . Arthritis   . Atrial fibrillation ([place])   . Complication of anesthesia    Atrial fibrillation during surgery   . Hard of hearing   . Hx of constipation   . Prostate cancer ([place])   . Restless legs   . Thyroid disease    hypothyroidism  . Vitiligo     Past Surgical History:  Procedure Laterality Date  . COLONOSCOPY    . LAMINECTOMY  10/2011   cervical and lumbar laminectomy  . PROSTATE BIOPSY  07/10/2011  . PROSTATE BIOPSY  09/22/15  . TOTAL HIP ARTHROPLASTY Right 02/29/2012  .  TRANSURETHRAL RESECTION OF PROSTATE       (Not in a hospital admission) No Known Allergies  Social History  Substance Use Topics  . Smoking status: Former Smoker    Types: Cigarettes    Quit date: 05/28/1974  . Smokeless tobacco: Never Used  . Alcohol use Yes     Comment: 4-5 standard drinks per week    Family History  Problem Relation Age of Onset  . Atrial fibrillation Mother   . Atrial fibrillation Sister   . Cancer Neg Hx      Review of Systems  Constitutional: Negative.   HENT: Positive for hearing loss.   Eyes: Negative.   Respiratory: Negative.   Cardiovascular: Negative.   Gastrointestinal: Negative.   Genitourinary: Negative.   Musculoskeletal: Positive for joint pain.  Skin: Negative.   Neurological: Negative.   Endo/Heme/Allergies: Negative.   Psychiatric/Behavioral: Negative.     Objective:  Physical Exam  Vitals reviewed. Constitutional: He is oriented to person, place, and time. He appears well-developed and well-nourished.  HENT:  Head: Normocephalic and atraumatic.  Eyes: Conjunctivae and EOM are normal. Pupils are equal, round, and reactive to light.  Neck: Normal range of motion. Neck supple.  Cardiovascular: Normal rate, regular rhythm and intact distal pulses.   Respiratory: Effort normal. No respiratory distress.  GI: Soft. He exhibits distension.  Genitourinary:  Genitourinary Comments: deferred  Musculoskeletal:       Right hip: He exhibits decreased range of motion.  Neurological: He is alert and oriented to person, place, and time. He has normal reflexes.  Skin: Skin is warm and dry.  Psychiatric: He has a normal mood and affect. His behavior is normal. Judgment and thought content normal.    Vital signs in last 24 hours: @VSRANGES @  Labs:   Estimated body mass index is 24.48 kg/m as calculated from the following:   Height as of an earlier encounter on 06/27/16: 5\' 11"  (1.803 m).   Weight as of an earlier encounter on 06/27/16:  79.6 kg (175 lb 8 oz).   Imaging Review Plain radiographs demonstrate severe degenerative joint disease of the right hip(s). The bone quality appears to be adequate for age and reported activity level.  Assessment/Plan:  End stage arthritis, right hip(s)  The patient history, physical examination, clinical judgement of the provider and imaging studies are consistent with end stage degenerative joint disease of the right hip(s) and total hip arthroplasty is deemed medically necessary. The treatment options including medical management, injection therapy, arthroscopy and arthroplasty were discussed at length. The risks and benefits of total hip arthroplasty were presented and reviewed. The risks due to aseptic loosening, infection, stiffness, dislocation/subluxation,  thromboembolic complications and other imponderables were discussed.  The patient acknowledged the explanation, agreed to proceed with the plan and consent was signed. Patient is being admitted for inpatient treatment for surgery, pain control, PT, OT, prophylactic antibiotics, VTE prophylaxis, progressive ambulation and ADL's and discharge planning.The patient is planning to be discharged with HEP

## 2016-07-04 NOTE — Transfer of Care (Signed)
Immediate Anesthesia Transfer of Care Note  Patient: Jesse [name]  Procedure(s) Performed: Procedure(s): RIGHT TOTAL HIP ARTHROPLASTY ANTERIOR APPROACH (Right)  Patient Location: PACU  Anesthesia Type:MAC and Spinal  Level of Consciousness: awake, alert  and oriented  Airway & Oxygen Therapy: Patient Spontanous Breathing and Patient connected to nasal cannula oxygen  Post-op Assessment: Report given to RN  Post vital signs: Reviewed and stable  Last Vitals:  Vitals:   07/04/16 1341  BP: (!) 151/89  Pulse: 73  Resp: 18  Temp: 36.8 C    Last Pain:  Vitals:   07/04/16 1341  TempSrc: Oral         Complications: No apparent anesthesia complications

## 2016-07-04 NOTE — Interval H&P Note (Signed)
History and Physical Interval Note:  07/04/2016 1:51 PM  Jesse [name]  has presented today for surgery, with the diagnosis of Degenerative joint disease right hip  The various methods of treatment have been discussed with the patient and family. After consideration of risks, benefits and other options for treatment, the patient has consented to  Procedure(s): RIGHT TOTAL HIP ARTHROPLASTY ANTERIOR APPROACH (Right) as a surgical intervention .  The patient's history has been reviewed, patient examined, no change in status, stable for surgery.  I have reviewed the patient's chart and labs.  Questions were answered to the patient's satisfaction.     Jaquin Coy, Horald Pollen

## 2016-07-04 NOTE — Discharge Instructions (Signed)
°Dr. Skie Vitrano °Joint Replacement Specialist °[facility] °[address] °[place], [place] [address] °[phone] ° ° °TOTAL HIP REPLACEMENT POSTOPERATIVE DIRECTIONS ° ° ° °Hip Rehabilitation, Guidelines Following Surgery  ° °WEIGHT BEARING °Weight bearing as tolerated with assist device (walker, cane, etc) as directed, use it as long as suggested by your surgeon or therapist, typically at least 4-6 weeks. ° °The results of a hip operation are greatly improved after range of motion and muscle strengthening exercises. Follow all safety measures which are given to protect your hip. If any of these exercises cause increased pain or swelling in your joint, decrease the amount until you are comfortable again. Then slowly increase the exercises. Call your caregiver if you have problems or questions.  ° °HOME CARE INSTRUCTIONS  °Most of the following instructions are designed to prevent the dislocation of your new hip.  °Remove items at home which could result in a fall. This includes throw rugs or furniture in walking pathways.  °Continue medications as instructed at time of discharge. °· You may have some home medications which will be placed on hold until you complete the course of blood thinner medication. °· You may start showering once you are discharged home. Do not remove your dressing. °Do not put on socks or shoes without following the instructions of your caregivers.   °Sit on chairs with arms. Use the chair arms to help push yourself up when arising.  °Arrange for the use of a toilet seat elevator so you are not sitting low.  °· Walk with walker as instructed.  °You may resume a sexual relationship in one month or when given the OK by your caregiver.  °Use walker as long as suggested by your caregivers.  °You may put full weight on your legs and walk as much as is comfortable. °Avoid periods of inactivity such as sitting longer than an hour when not asleep. This helps prevent  blood clots.  °You may return to work once you are cleared by your surgeon.  °Do not drive a car for 6 weeks or until released by your surgeon.  °Do not drive while taking narcotics.  °Wear elastic stockings for two weeks following surgery during the day but you may remove then at night.  °Make sure you keep all of your appointments after your operation with all of your doctors and caregivers. You should call the office at the above phone number and make an appointment for approximately two weeks after the date of your surgery. °Please pick up a stool softener and laxative for home use as long as you are requiring pain medications. °· ICE to the affected hip every three hours for 30 minutes at a time and then as needed for pain and swelling. Continue to use ice on the hip for pain and swelling from surgery. You may notice swelling that will progress down to the foot and ankle.  This is normal after surgery.  Elevate the leg when you are not up walking on it.   °It is important for you to complete the blood thinner medication as prescribed by your doctor. °· Continue to use the breathing machine which will help keep your temperature down.  It is common for your temperature to cycle up and down following surgery, especially at night when you are not up moving around and exerting yourself.  The breathing machine keeps your lungs expanded and your temperature down. ° °RANGE OF MOTION AND STRENGTHENING EXERCISES  °These exercises are   designed to help you keep full movement of your hip joint. Follow your caregiver's or physical therapist's instructions. Perform all exercises about fifteen times, three times per day or as directed. Exercise both hips, even if you have had only one joint replacement. These exercises can be done on a training (exercise) mat, on the floor, on a table or on a bed. Use whatever works the best and is most comfortable for you. Use music or television while you are exercising so that the exercises  are a pleasant break in your day. This will make your life better with the exercises acting as a break in routine you can look forward to.  °Lying on your back, slowly slide your foot toward your buttocks, raising your knee up off the floor. Then slowly slide your foot back down until your leg is straight again.  °Lying on your back spread your legs as far apart as you can without causing discomfort.  °Lying on your side, raise your upper leg and foot straight up from the floor as far as is comfortable. Slowly lower the leg and repeat.  °Lying on your back, tighten up the muscle in the front of your thigh (quadriceps muscles). You can do this by keeping your leg straight and trying to raise your heel off the floor. This helps strengthen the largest muscle supporting your knee.  °Lying on your back, tighten up the muscles of your buttocks both with the legs straight and with the knee bent at a comfortable angle while keeping your heel on the floor.  ° °SKILLED REHAB INSTRUCTIONS: °If the patient is transferred to a skilled rehab facility following release from the hospital, a list of the current medications will be sent to the facility for the patient to continue.  When discharged from the skilled rehab facility, please have the facility set up the patient's Home Health Physical Therapy prior to being released. Also, the skilled facility will be responsible for providing the patient with their medications at time of release from the facility to include their pain medication and their blood thinner medication. If the patient is still at the rehab facility at time of the two week follow up appointment, the skilled rehab facility will also need to assist the patient in arranging follow up appointment in our office and any transportation needs. ° °MAKE SURE YOU:  °Understand these instructions.  °Will watch your condition.  °Will get help right away if you are not doing well or get worse. ° °Pick up stool softner and  laxative for home use following surgery while on pain medications. °Do not remove your dressing. °The dressing is waterproof--it is OK to take showers. °Continue to use ice for pain and swelling after surgery. °Do not use any lotions or creams on the incision until instructed by your surgeon. °Total Hip Protocol. ° ° °

## 2016-07-04 NOTE — Op Note (Signed)
OPERATIVE REPORT  SURGEON: Rod Can, MD   ASSISTANT: April Green, RNFA.  PREOPERATIVE DIAGNOSIS: Right hip arthritis.   POSTOPERATIVE DIAGNOSIS: Right hip arthritis.   PROCEDURE: Right total hip arthroplasty, anterior approach.   IMPLANTS: DePuy Tri Lock stem, size 8, std offset. DePuy Pinnacle Cup, size 56 mm. DePuy Altrx liner, size 36 by 56 mm, neutral. DePuy Biolox ceramic head ball, size 36 + 1.5 mm.  ANESTHESIA:  Spinal  ESTIMATED BLOOD LOSS: 550 mL.  ANTIBIOTICS: 2 g Ancef.  DRAINS: None.  COMPLICATIONS: None.   CONDITION: PACU - hemodynamically stable.Marland Kitchen   BRIEF CLINICAL NOTE: Jesse [name] is a 76 y.o. male with a long-standing history of Right hip arthritis. After failing conservative management, the patient was indicated for total hip arthroplasty. The risks, benefits, and alternatives to the procedure were explained, and the patient elected to proceed.  PROCEDURE IN DETAIL: Surgical site was marked by myself  in the pre-op holding area. Once inside the operative room, spinal anesthesia was obtained. The patient was then positioned on the Hana table. All bony prominences were well padded. The hip was prepped and draped in the normal sterile surgical fashion. A time-out was called verifying side and site of surgery. The patient received IV antibiotics within 60 minutes of beginning the procedure.  The direct anterior approach to the hip was performed through the Hueter interval. Lateral femoral circumflex vessels were treated with the Auqumantys. The anterior capsule was exposed and an inverted T capsulotomy was made.The femoral neck cut was made to the level of the templated cut. A corkscrew was placed into the head and the head was removed. The femoral head was found to have eburnated bone. The head was passed to the back table and was measured.  Acetabular exposure was achieved, and the pulvinar and labrum were excised. Sequental reaming of the  acetabulum was then performed up to a size 55 mm reamer. A 56 mm cup was then opened and impacted into place at approximately 40 degrees of abduction and 20 degrees of anteversion. The final polyethylene liner was impacted into place and acetabular osteophytes were removed.   I then gained femoral exposure taking care to protect the abductors and greater trochanter. This was performed using standard external rotation, extension, and adduction. The capsule was peeled off the inner aspect of the greater trochanter, taking care to preserve the short external rotators. A cookie cutter was used to enter the femoral canal, and then the femoral canal finder was placed. Sequential broaching was performed up to a size 8. Calcar planer was used on the femoral neck remnant. I placed a std offset neck and a trial head ball. The hip was reduced. Leg lengths and offset were checked fluoroscopically. The hip was dislocated and trial components were removed. The final implants were placed, and the hip was reduced.  Fluoroscopy was used to confirm component position and leg lengths. At 90 degrees of external rotation and full extension, the hip was stable to an anterior directed force.  The wound was copiously irrigated with a dilute betadine solution followed by normal saline. Marcaine solution was injected into the periarticular soft tissue. The wound was closed in layers using #1 Vicryl and V-Loc for the fascia, 2-0 Vicryl for the subcutaneous fat, 2-0 Monocryl for the deep dermal layer, 3-0 running Monocryl subcuticular stitch, and Dermabond for the skin. Once the glue was fully dried, an Aquacell Ag dressing was applied. In and out bladder catheterization was performed. The patient was transported  to the recovery room in stable condition. Sponge, needle, and instrument counts were correct at the end of the case x2. The patient tolerated the procedure well and there were no known complications.

## 2016-07-04 NOTE — Anesthesia Procedure Notes (Signed)
Spinal  Patient location during procedure: OR Start time: 07/04/2016 3:58 PM End time: 07/04/2016 4:02 PM Staffing Anesthesiologist: Marcie Bal, Joni Norrod Performed: anesthesiologist  Preanesthetic Checklist Completed: patient identified, site marked, surgical consent, pre-op evaluation, timeout performed, IV checked, risks and benefits discussed and monitors and equipment checked Spinal Block Patient position: sitting Prep: DuraPrep Patient monitoring: cardiac monitor, continuous pulse ox and blood pressure Approach: midline Location: L3-4 Injection technique: single-shot Needle Needle type: Pencan  Needle gauge: 24 G Needle length: 9 cm Assessment Sensory level: T10 Additional Notes Functioning IV was confirmed and monitors were applied. Sterile prep and drape, including hand hygiene and sterile gloves were used. The patient was positioned and the spine was prepped. The skin was anesthetized with lidocaine.  Free flow of clear CSF was obtained prior to injecting local anesthetic into the CSF.  The spinal needle aspirated freely following injection.  The needle was carefully withdrawn.  The patient tolerated the procedure well.

## 2016-07-05 LAB — CBC
HCT: 30.8 % — ABNORMAL LOW (ref 39.0–52.0)
Hemoglobin: 10.3 g/dL — ABNORMAL LOW (ref 13.0–17.0)
MCH: 28.9 pg (ref 26.0–34.0)
MCHC: 33.4 g/dL (ref 30.0–36.0)
MCV: 86.3 fL (ref 78.0–100.0)
Platelets: 140 10*3/uL — ABNORMAL LOW (ref 150–400)
RBC: 3.57 MIL/uL — ABNORMAL LOW (ref 4.22–5.81)
RDW: 13 % (ref 11.5–15.5)
WBC: 7 10*3/uL (ref 4.0–10.5)

## 2016-07-05 LAB — BASIC METABOLIC PANEL
Anion gap: 8 (ref 5–15)
BUN: 19 mg/dL (ref 6–20)
CO2: 26 mmol/L (ref 22–32)
Calcium: 8.3 mg/dL — ABNORMAL LOW (ref 8.9–10.3)
Chloride: 101 mmol/L (ref 101–111)
Creatinine, Ser: 0.82 mg/dL (ref 0.61–1.24)
GFR calc Af Amer: >60 mL/min (ref >60)
GFR calc non Af Amer: >60 mL/min (ref >60)
Glucose, Bld: 138 mg/dL — ABNORMAL HIGH (ref 65–99)
Potassium: 4.3 mmol/L (ref 3.5–5.1)
Sodium: 135 mmol/L (ref 135–145)

## 2016-07-05 MED ORDER — DOCUSATE SODIUM 100 MG PO CAPS: 100.0000 mg | ORAL_CAPSULE | Freq: Two times a day (BID) | ORAL | 0 refills | Status: DC

## 2016-07-05 MED ORDER — SENNA 8.6 MG PO TABS: 2.0000 | ORAL_TABLET | Freq: Every day | ORAL | 0 refills | Status: DC

## 2016-07-05 MED ORDER — HYDROCODONE-ACETAMINOPHEN 5-325 MG PO TABS: 1.0000 | ORAL_TABLET | ORAL | 0 refills | Status: DC | PRN

## 2016-07-05 MED ORDER — ASPIRIN 81 MG PO CHEW: 81.0000 mg | CHEWABLE_TABLET | Freq: Two times a day (BID) | ORAL | 1 refills | Status: DC

## 2016-07-05 MED ORDER — ONDANSETRON HCL 4 MG PO TABS: 4.0000 mg | ORAL_TABLET | Freq: Four times a day (QID) | ORAL | 0 refills | Status: DC | PRN

## 2016-07-05 NOTE — Evaluation (Signed)
Physical Therapy Evaluation Patient Details Name: Jesse [name] MRN: [id number] DOB: Nov 05, 1940 Today's Date: 07/05/2016   History of Present Illness  76 y.o. male admitted to [facility] on 07/04/16 for elective R direct anterior THA.  Pt with significant PMHx of L posterior THA, restless legs, HOH, A-fib, back surgery, and  R heel fx.    Clinical Impression  Pt is POD #1 and is guarded and anxious about pain and spasm, but did well once we did his exercises and got up on his feet.  Min assist overall and walked a good distance into the hallway without any lightheadedness.  His wife is a retired Therapist, sports and is well qualified to help him at home.  I anticipate he will progress well with mobility.   PT to follow acutely for deficits listed below.       Follow Up Recommendations Home health PT;Supervision for mobility/OOB    Equipment Recommendations  None recommended by PT    Recommendations for Other Services   NA     Precautions / Restrictions Restrictions Weight Bearing Restrictions: Yes RLE Weight Bearing: Weight bearing as tolerated      Mobility  Bed Mobility Overal bed mobility: Needs Assistance Bed Mobility: Supine to Sit     Supine to sit: Min assist;HOB elevated     General bed mobility comments: Min assist to help progress right leg over EOB.  Preformed "warm up" bed exercises prior to mobilizing which I think helped.   Transfers Overall transfer level: Needs assistance Equipment used: Rolling walker (2 wheeled) Transfers: Sit to/from Stand Sit to Stand: Min assist;From elevated surface         General transfer comment: Min assist to support trunk and stabilize RW during transitions. verbal cues for safe hand placement.  RW adjusted to fit pt's height and arm length.  Verbal cues for upright posture in standing.   Ambulation/Gait Ambulation/Gait assistance: Min assist Ambulation Distance (Feet): 80 Feet Assistive device: Rolling walker (2 wheeled) Gait Pattern/deviations:  Step-through pattern;Antalgic;Trunk flexed Gait velocity: decreased Gait velocity interpretation: Below normal speed for age/gender General Gait Details: Verbal cues for upright posture, correct LE sequencing and safe RW use.  Pt walking and talking, no reports of lightheadedness.          Balance Overall balance assessment: Needs assistance Sitting-balance support: Feet supported;No upper extremity supported Sitting balance-Leahy Scale: Good     Standing balance support: Bilateral upper extremity supported Standing balance-Leahy Scale: Poor                               Pertinent Vitals/Pain Pain Assessment: 0-10 Pain Score: 4  Pain Location: right hip Pain Descriptors / Indicators: Aching;Burning;Spasm Pain Intervention(s): Limited activity within patient's tolerance;Monitored during session;Premedicated before session;Repositioned;Ice applied    Home Living Family/patient expects to be discharged to:: Private residence Living Arrangements: Spouse/significant other (retired Therapist, sports) Available Help at Discharge: Family;Available 24 hours/day Type of Home: House Home Access: Stairs to enter Entrance Stairs-Rails: None Entrance Stairs-Number of Steps: 2 Home Layout: One level Home Equipment: Walker - 2 wheels;Cane - quad;Bedside commode;Shower seat      Prior Function Level of Independence: Independent                  Extremity/Trunk Assessment   Upper Extremity Assessment Upper Extremity Assessment: Defer to OT evaluation    Lower Extremity Assessment Lower Extremity Assessment: RLE deficits/detail RLE Deficits / Details: right leg with normal  post op pain and weakness.  Pt is very guarded and anticipates pain with movement.  Cues for deep breathing and to relax the ankel and knee.  Ankle limited by previous injury 3-/5, knee 3-/5 hip 2/5 per gross functional assessment.     Cervical / Trunk Assessment Cervical / Trunk Assessment: Other  exceptions Cervical / Trunk Exceptions: h/o back surgery, flexed during gait  Communication   Communication: HOH  Cognition Arousal/Alertness: Awake/alert Behavior During Therapy: Anxious Overall Cognitive Status: Within Functional Limits for tasks assessed                         Exercises Total Joint Exercises Ankle Circles/Pumps: AROM;AAROM;Both;20 reps Quad Sets: AROM;Both;10 reps Heel Slides: AAROM;Right;10 reps Hip ABduction/ADduction: AAROM;Right;10 reps Long Arc Quad: AROM;Right;10 reps   Assessment/Plan    PT Assessment Patient needs continued PT services  PT Problem List Decreased strength;Decreased range of motion;Decreased activity tolerance;Decreased balance;Decreased mobility;Decreased knowledge of use of DME;Pain          PT Treatment Interventions DME instruction;Stair training;Gait training;Functional mobility training;Therapeutic activities;Therapeutic exercise;Balance training;Patient/family education;Modalities;Manual techniques    PT Goals (Current goals can be found in the Care Plan section)  Acute Rehab PT Goals Patient Stated Goal: to go home at discharge.   PT Goal Formulation: With patient/family Time For Goal Achievement: 07/12/16 Potential to Achieve Goals: Good    Frequency 7X/week           End of Session Equipment Utilized During Treatment: Gait belt Activity Tolerance: Patient limited by pain Patient left: in chair;with call bell/phone within reach;with family/visitor present           Time: [id number] PT Time Calculation (min) (ACUTE ONLY): 38 min   Charges:   PT Evaluation $PT Eval Moderate Complexity: 1 Procedure PT Treatments $Gait Training: 8-22 mins $Therapeutic Exercise: 8-22 mins        Denisse Whitenack B. [place], [place], DPT [phone]   07/05/2016, 11:43 AM

## 2016-07-05 NOTE — Progress Notes (Signed)
Physical Therapy Treatment Patient Details Name: Jesse [name] MRN: [id number] DOB: [birth year] Today's Date: 07/05/2016    History of Present Illness 76 y.o. male admitted to [facility] on 07/04/16 for elective R direct anterior THA.  Pt with significant PMHx of L posterior THA, restless legs, HOH, A-fib, back surgery, and  R heel fx.      PT Comments    Pt able to tolerate increased level of activity with no increase in pain. He was limited in beginning of session with mobility, becoming distracted by cramping in leg, but able to improve the longer he was up. Pt states he is familiar with procedure due to PMH of L THA. Communicated with patient benefits of exercises given and ambulating with nursing if wanting to get up to decrease pain throughout the day. Gave pt HEP handout and encouraged him to perform exercises one more time prior to going to sleep. PT will continue to progress ambulation and hip exercises until discharge.  .   Follow Up Recommendations  Home health PT;Supervision for mobility/OOB     Equipment Recommendations  None recommended by PT    Recommendations for Other Services       Precautions / Restrictions Precautions Precautions: None Restrictions Weight Bearing Restrictions: Yes RLE Weight Bearing: Weight bearing as tolerated    Mobility  Bed Mobility               General bed mobility comments: pt sitting up in chair upon arrival  Transfers Overall transfer level: Needs assistance Equipment used: Rolling walker (2 wheeled) Transfers: Sit to/from Stand Sit to Stand: Min assist         General transfer comment: min A to boost up into standing due to pain and pt inability to bear full weight through R LE.   Ambulation/Gait Ambulation/Gait assistance: Min assist Ambulation Distance (Feet): 150 Feet Assistive device: Rolling walker (2 wheeled) Gait Pattern/deviations: Step-through pattern;Antalgic;Trunk flexed;Step-to pattern     General Gait Details:  Began with step to pattern; pt limited due to pain in R ankle and knee, unable to apply heel strike. Pt required cuing to improve adequate gait sequencing and safe use of RW; pt trying to pick RW up with step to pattern. Transitioned to step through pattern with flat foot once exiting the room. No c/o lightheadedness or increaed pain in LE.    Stairs            Wheelchair Mobility    Modified Rankin (Stroke Patients Only)       Balance Overall balance assessment: Needs assistance Sitting-balance support: Feet supported Sitting balance-Leahy Scale: Good Sitting balance - Comments: sitting edge of chair with no back support   Standing balance support: Bilateral upper extremity supported;During functional activity Standing balance-Leahy Scale: Poor Standing balance comment: required RW with standing balance to prevent LOB.                     Cognition Arousal/Alertness: Awake/alert Behavior During Therapy: Anxious Overall Cognitive Status: Within Functional Limits for tasks assessed                      Exercises Total Joint Exercises Ankle Circles/Pumps: Seated;10 reps;Both Quad Sets: 10 reps;Seated;Right Short Arc Quad: Right;Seated;10 reps Hip ABduction/ADduction: Right;10 reps;Seated Long Arc Quad: Right;10 reps;Seated Marching in Standing: Right;10 reps;Standing General Exercises - Lower Extremity Ankle Circles/Pumps: 10 reps;Both;Seated Quad Sets: Right;10 reps;Seated Short Arc Quad: Right;10 reps;Seated Long Arc Quad: AAROM;10 reps;Right;Seated Hip ABduction/ADduction: Right;5  reps;Seated Hip Flexion/Marching: Right;5 reps;Standing Other Exercises Other Exercises: lumbar extension to neutral x5 reps in standing    General Comments        Pertinent Vitals/Pain Pain Assessment: Faces Pain Score: 5  Faces Pain Scale: Hurts even more Pain Location: right hip and anterior thigh Pain Descriptors / Indicators: Aching;Spasm;Cramping Pain  Intervention(s): Monitored during session;Repositioned;Ice applied    Home Living Family/patient expects to be discharged to:: Private residence Living Arrangements: Spouse/significant other (retired Therapist, sports) Available Help at Discharge: Family;Available 24 hours/day Type of Home: House Home Access: Stairs to enter Entrance Stairs-Rails: None Home Layout: One level Home Equipment: Environmental consultant - 2 wheels;Cane - quad;Bedside commode;Shower seat      Prior Function Level of Independence: Independent          PT Goals (current goals can now be found in the care plan section) Acute Rehab PT Goals Patient Stated Goal: to go home PT Goal Formulation: With patient/family Potential to Achieve Goals: Good Progress towards PT goals: Progressing toward goals    Frequency    7X/week      PT Plan Current plan remains appropriate    Co-evaluation             End of Session Equipment Utilized During Treatment: Gait belt Activity Tolerance: Patient limited by pain Patient left: in chair;with call bell/phone within reach     Time: [id number] PT Time Calculation (min) (ACUTE ONLY): 34 min  Charges:  $Gait Training: 8-22 mins $Therapeutic Exercise: 8-22 mins                    G Codes:      [facility] July 06, 2016, 5:11 PM Olena [place], [place] Pager [phone]

## 2016-07-05 NOTE — Evaluation (Signed)
Occupational Therapy Evaluation and Discharge Patient Details Name: Jesse [name] MRN: [id number] DOB: 1940-12-14 Today's Date: 07/05/2016    History of Present Illness 76 y.o. male admitted to [facility] on 07/04/16 for elective R direct anterior THA.  Pt with significant PMHx of L posterior THA, restless legs, HOH, A-fib, back surgery, and  R heel fx.     Clinical Impression   PTA Pt independent in ADL (sometimes got help with socks due to pain) and mobility. Pt currently max A for LB ADL and min guard for mobility with RW. Pt fully assessed and education complete. Pt able to demonstrate safety in toilet and shower transfer and will have help at home for assist with LB dressing/bathing from wife. Pt anxious but safety concious. Educated on benefits of movement. Pt at adequate state for discharge from OT perspective. OT to sign off at this time. Thank you for the referral.     Follow Up Recommendations  No OT follow up;Supervision/Assistance - 24 hour (initially)    Equipment Recommendations  None recommended by OT (Pt has appropriate DME)    Recommendations for Other Services       Precautions / Restrictions Precautions Precautions: None Restrictions Weight Bearing Restrictions: Yes RLE Weight Bearing: Weight bearing as tolerated      Mobility Bed Mobility Overal bed mobility: Needs Assistance Bed Mobility: Supine to Sit     Supine to sit: Min assist;HOB elevated     General bed mobility comments: Pt sitting OOB in recliner when OT enetered the room  Transfers Overall transfer level: Needs assistance Equipment used: Rolling walker (2 wheeled) Transfers: Sit to/from Stand Sit to Stand: Min guard         General transfer comment: OT stabilized walker, but good hand placement for power up    Balance Overall balance assessment: Needs assistance Sitting-balance support: Feet supported;No upper extremity supported Sitting balance-Leahy Scale: Good Sitting balance -  Comments: sitting edge of chair with no back support   Standing balance support: Bilateral upper extremity supported;During functional activity Standing balance-Leahy Scale: Poor                              ADL Overall ADL's : Needs assistance/impaired     Grooming: Min guard;Standing   Upper Body Bathing: Minimal assistance;With caregiver independent assisting   Lower Body Bathing: Moderate assistance;With caregiver independent assisting   Upper Body Dressing : Minimal assistance;With caregiver independent assisting   Lower Body Dressing: Moderate assistance;With caregiver independent assisting;Sit to/from stand   Toilet Transfer: Min guard;Ambulation;Comfort height toilet;Grab bars;RW   Toileting- Water quality scientist and Hygiene: Min guard;Sit to/from stand   Tub/ Shower Transfer: Walk-in shower;Min guard;Ambulation;Rolling walker;Shower Scientist, research (medical) Details (indicate cue type and reason): educated on safety of having caregiver present. Pt replied "Oh yes of course!" Functional mobility during ADLs: Min guard;Rolling walker;Cueing for sequencing       Vision Vision Assessment?: No apparent visual deficits   Perception     Praxis      Pertinent Vitals/Pain Pain Assessment: 0-10 Pain Score: 5  Pain Location: right hip Pain Descriptors / Indicators: Aching;Burning;Spasm Pain Intervention(s): Monitored during session;Repositioned;Ice applied     Hand Dominance     Extremity/Trunk Assessment Upper Extremity Assessment Upper Extremity Assessment: Overall WFL for tasks assessed (Pt did pushups in preparation)   Lower Extremity Assessment Lower Extremity Assessment: RLE deficits/detail RLE Deficits / Details: right leg with normal post op pain and  weakness   Cervical / Trunk Assessment Cervical / Trunk Assessment: Other exceptions Cervical / Trunk Exceptions: h/o back surgery, flexed during gait   Communication  Communication Communication: HOH   Cognition Arousal/Alertness: Awake/alert Behavior During Therapy: Anxious Overall Cognitive Status: Within Functional Limits for tasks assessed                     General Comments       Exercises      Shoulder Instructions      Home Living Family/patient expects to be discharged to:: Private residence Living Arrangements: Spouse/significant other (retired Therapist, sports) Available Help at Discharge: Family;Available 24 hours/day Type of Home: House Home Access: Stairs to enter [employer] of Steps: 2 Entrance Stairs-Rails: None Home Layout: One level     Bathroom Shower/Tub: Occupational psychologist: Standard Bathroom Accessibility: Yes How Accessible: Accessible via walker Home Equipment: [place] - 2 wheels;Cane - quad;Bedside commode;Shower seat          Prior Functioning/Environment Level of Independence: Independent                 OT Problem List:     OT Treatment/Interventions:      OT Goals(Current goals can be found in the care plan section) Acute Rehab OT Goals Patient Stated Goal: to go home at discharge.   OT Goal Formulation: With patient Time For Goal Achievement: 07/19/16 Potential to Achieve Goals: Good  OT Frequency:     Barriers to D/C:            Co-evaluation              End of Session Equipment Utilized During Treatment: Gait belt;Rolling walker Nurse Communication: Mobility status;Weight bearing status (OT complete)  Activity Tolerance: Patient tolerated treatment well Patient left: in chair;with call bell/phone within reach   Time: 1349-1422 OT Time Calculation (min): 33 min Charges:  OT General Charges $OT Visit: 1 Procedure OT Evaluation $OT Eval Moderate Complexity: 1 Procedure OT Treatments $Self Care/Home Management : 8-22 mins G-Codes:    [name] 08/01/16, 2:23 PM  [name] OTR/L [phone]

## 2016-07-05 NOTE — Anesthesia Postprocedure Evaluation (Addendum)
Anesthesia Post Note  Patient: Jesse [name]  Procedure(s) Performed: Procedure(s) (LRB): RIGHT TOTAL HIP ARTHROPLASTY ANTERIOR APPROACH (Right)  Patient location during evaluation: PACU Anesthesia Type: MAC Level of consciousness: awake and alert Pain management: pain level controlled Vital Signs Assessment: post-procedure vital signs reviewed and stable Respiratory status: spontaneous breathing, nonlabored ventilation, respiratory function stable and patient connected to nasal cannula oxygen Cardiovascular status: stable and blood pressure returned to baseline Postop Assessment: no signs of nausea or vomiting Anesthetic complications: no       Last Vitals:  Vitals:   07/05/16 0015 07/05/16 0409  BP: 134/62 (!) 117/55  Pulse: 60 71  Resp: 12 12  Temp: 36.4 C 36.7 C    Last Pain:  Vitals:   07/05/16 0745  TempSrc:   PainSc: 5                  Annalaura Sauseda

## 2016-07-05 NOTE — Care Management Note (Signed)
Case Management Note  Patient Details  Name: Jesse [name] MRN: [id number] Date of Birth: July 10, 1940  Subjective/Objective:    76 yr old gentleman s/p right total hip arthroplasty.                Action/Plan: Case manager spoke with patient concerning Discharge plans and DMe needs. Patient will be going directly to outpatient therapy and has no DME needs. He will have family support at discharge. Case manager has signed off.    Expected Discharge Date:  07/06/16               Expected Discharge Plan:  Home/Self Care  In-House Referral:  NA  Discharge planning Services  CM Consult  Post Acute Care Choice:  NA Choice offered to:  NA  DME Arranged:  N/A (patient has RW 3in1) DME Agency:  NA  HH Arranged:  NA HH Agency:  NA  Status of Service:  Completed, signed off  If discussed at [place] of Stay Meetings, dates discussed:    Additional Comments:  Ninfa Meeker, RN 07/05/2016, 12:29 PM

## 2016-07-05 NOTE — Discharge Summary (Signed)
Physician Discharge Summary  Patient ID: KANARI STVIL MRN: [id number] DOB/AGE: 77-Jun-1942 77 y.o.  Admit date: 07/04/2016 Discharge date: 07/06/2016  Admission Diagnoses:  Osteoarthritis of right hip  Discharge Diagnoses:  Principal Problem:   Osteoarthritis of right hip Active Problems:   Primary osteoarthritis of right hip   Past Medical History:  Diagnosis Date  . Arthritis   . Atrial fibrillation ([place])   . Complication of anesthesia    Atrial fibrillation during surgery   . Hard of hearing   . Hx of constipation   . Prostate cancer ([place])   . Restless legs   . Thyroid disease    hypothyroidism  . Vitiligo     Surgeries: Procedure(s): RIGHT TOTAL HIP ARTHROPLASTY ANTERIOR APPROACH on 07/04/2016   Consultants (if any):   Discharged Condition: Improved  Hospital Course: Jesse [name] is an 76 y.o. male who was admitted 07/04/2016 with a diagnosis of Osteoarthritis of right hip and went to the operating room on 07/04/2016 and underwent the above named procedures.    He was given perioperative antibiotics:  Anti-infectives    Start     Dose/Rate Route Frequency Ordered Stop   07/04/16 2200  ceFAZolin (ANCEF) IVPB 2g/100 mL premix     2 g 200 mL/hr over 30 Minutes Intravenous Every 6 hours 07/04/16 1959 07/05/16 0959   07/04/16 1500  ceFAZolin (ANCEF) IVPB 2g/100 mL premix     2 g 200 mL/hr over 30 Minutes Intravenous To ShortStay Surgical 07/04/16 0644 07/04/16 1604    .  He was given sequential compression devices, early ambulation, and ASA for DVT prophylaxis.  He benefited maximally from the hospital stay and there were no complications.    Recent vital signs:  Vitals:   07/05/16 2100 07/06/16 0429  BP: 117/68 131/79  Pulse: 78 68  Resp:  16  Temp: 98 F (36.7 C) 97.7 F (36.5 C)    Recent laboratory studies:  Lab Results  Component Value Date   HGB 10.2 (L) 07/06/2016   HGB 10.3 (L) 07/05/2016   HGB 13.3 06/27/2016   Lab Results  Component Value  Date   WBC 9.6 07/06/2016   PLT 162 07/06/2016   No results found for: INR Lab Results  Component Value Date   NA 135 07/05/2016   K 4.3 07/05/2016   CL 101 07/05/2016   CO2 26 07/05/2016   BUN 19 07/05/2016   CREATININE 0.82 07/05/2016   GLUCOSE 138 (H) 07/05/2016    Discharge Medications:   Allergies as of 07/06/2016   No Known Allergies     Medication List    STOP taking these medications   acetaminophen 500 MG tablet Commonly known as:  TYLENOL   traMADol 50 MG tablet Commonly known as:  ULTRAM     TAKE these medications   aspirin 81 MG chewable tablet Chew 1 tablet (81 mg total) by mouth 2 (two) times daily. Notes to patient:  Please take dose tonight before bed   docusate sodium 100 MG capsule Commonly known as:  COLACE Take 1 capsule (100 mg total) by mouth 2 (two) times daily. Notes to patient:  Please take dose tonight before bed   Glucosamine-Chondroitin Tabs Take 1 tablet by mouth daily. Notes to patient:  Please resume medication at you normal schedule   GLUCOSAMINE-MSM PO Take 1 tablet by mouth daily. Notes to patient:  Please resume medication at you normal schedule   HYDROcodone-acetaminophen 5-325 MG tablet Commonly known as:  NORCO/VICODIN Take  1-2 tablets by mouth every 4 (four) hours as needed (breakthrough pain).   levothyroxine 50 MCG tablet Commonly known as:  SYNTHROID, LEVOTHROID Take 50 mcg by mouth daily before breakfast. Notes to patient:  Please take medication in the morning   methocarbamol 500 MG tablet Commonly known as:  ROBAXIN Take 1 tablet (500 mg total) by mouth every 6 (six) hours as needed for muscle spasms.   MULTIVITAMIN ADULT PO Take 1 tablet by mouth daily. One a Day Notes to patient:  Please take this medication in the morning   ondansetron 4 MG tablet Commonly known as:  ZOFRAN Take 1 tablet (4 mg total) by mouth every 6 (six) hours as needed for nausea.   senna 8.6 MG Tabs tablet Commonly known as:   SENOKOT Take 2 tablets (17.2 mg total) by mouth at bedtime. Notes to patient:  Please take medication tonight before bed       Diagnostic Studies: Dg Pelvis Portable  Result Date: 07/04/2016 CLINICAL DATA:  Status post right hip replacement EXAM: PORTABLE PELVIS 1-2 VIEWS COMPARISON:  Fluoroscopy earlier today FINDINGS: Total right hip arthroplasty is located. No evidence of periprosthetic fracture. Unremarkable visualized portions of total left hip arthroplasty. Prostate fiducial markers. IMPRESSION: No acute finding after total right hip arthroplasty. Electronically Signed   By: Monte Fantasia M.D.   On: 07/04/2016 19:09   Dg C-arm 61-120 Min  Result Date: 07/04/2016 CLINICAL DATA:  Right hip arthroplasty EXAM: DG C-ARM 61-120 MIN; OPERATIVE RIGHT HIP WITH PELVIS COMPARISON:  None. FINDINGS: Total fluoroscopy time was 28 seconds. Two low resolution intraoperative spot films of the right hip obtained. The images demonstrate a right hip arthroplasty with normal alignment. There is a partially visualized prior left hip arthroplasty. IMPRESSION: Intraoperative fluoroscopic assistance provided during right hip arthroplasty Electronically Signed   By: Donavan Foil M.D.   On: 07/04/2016 18:24   Dg Hip Operative Unilat W Or W/o Pelvis Right  Result Date: 07/04/2016 CLINICAL DATA:  Right hip arthroplasty EXAM: DG C-ARM 61-120 MIN; OPERATIVE RIGHT HIP WITH PELVIS COMPARISON:  None. FINDINGS: Total fluoroscopy time was 28 seconds. Two low resolution intraoperative spot films of the right hip obtained. The images demonstrate a right hip arthroplasty with normal alignment. There is a partially visualized prior left hip arthroplasty. IMPRESSION: Intraoperative fluoroscopic assistance provided during right hip arthroplasty Electronically Signed   By: Donavan Foil M.D.   On: 07/04/2016 18:24    Disposition: 01-Home or Self Care  Discharge Instructions    Call MD / Call 911    Complete by:  As directed     If you experience chest pain or shortness of breath, CALL 911 and be transported to the hospital emergency room.  If you develope a fever above 101 F, pus (white drainage) or increased drainage or redness at the wound, or calf pain, call your surgeon's office.   Constipation Prevention    Complete by:  As directed    Drink plenty of fluids.  Prune juice may be helpful.  You may use a stool softener, such as Colace (over the counter) 100 mg twice a day.  Use MiraLax (over the counter) for constipation as needed.   Diet - low sodium heart healthy    Complete by:  As directed    Driving restrictions    Complete by:  As directed    No driving for 6 weeks   Increase activity slowly as tolerated    Complete by:  As directed  Lifting restrictions    Complete by:  As directed    No lifting for 6 weeks   TED hose    Complete by:  As directed    Use stockings (TED hose) for 6 weeks on both leg(s).  You may remove them at night for sleeping.      Follow-up Information    Phineas Mcenroe, Horald Pollen, MD. Go on 07/18/2016.   Specialty:  Orthopedic Surgery Why:  Your appointment is at 9 AM. Contact information: [address] [place] [address] [phone]            Signed: Elie Goody 07/06/2016, 3:32 PM

## 2016-07-05 NOTE — Progress Notes (Signed)
   Subjective:  Patient reports pain as moderate to severe.  Pain control issues o/n. Denies CP/SOB/V. (+) N.  Objective:   VITALS:   Vitals:   07/04/16 1932 07/04/16 2013 07/05/16 0015 07/05/16 0409  BP: 132/69 134/79 134/62 (!) 117/55  Pulse: (!) 53 64 60 71  Resp: 12 12 12 12   Temp:  97.6 F (36.4 C) 97.5 F (36.4 C) 98 F (36.7 C)  TempSrc:  Oral Oral Oral  SpO2: 100% 100% 100% 97%  Weight:        NAD ABD soft Sensation intact distally Intact pulses distally Dorsiflexion/Plantar flexion intact Incision: dressing C/D/I Compartment soft   Lab Results  Component Value Date   WBC 7.0 07/05/2016   HGB 10.3 (L) 07/05/2016   HCT 30.8 (L) 07/05/2016   MCV 86.3 07/05/2016   PLT 140 (L) 07/05/2016   BMET    Component Value Date/Time   NA 135 07/05/2016 0354   K 4.3 07/05/2016 0354   CL 101 07/05/2016 0354   CO2 26 07/05/2016 0354   GLUCOSE 138 (H) 07/05/2016 0354   BUN 19 07/05/2016 0354   CREATININE 0.82 07/05/2016 0354   CALCIUM 8.3 (L) 07/05/2016 0354   GFRNONAA >60 07/05/2016 0354   GFRAA >60 07/05/2016 0354     Assessment/Plan: 1 Day Post-Op   Principal Problem:   Osteoarthritis of right hip Active Problems:   Primary osteoarthritis of right hip   WBAT with walker PO pain control PT/OT DVT ppx: ASA, SCDs, tEDS Dispo: d/c home when pain control is improved, likely tomorrow, with outpatient PT (already set up)   Ludmila Ebarb, Horald Pollen 07/05/2016, 7:51 AM   Rod Can, MD Cell [phone]

## 2016-07-06 ENCOUNTER — Encounter (HOSPITAL_COMMUNITY): Payer: Self-pay | Admitting: Orthopedic Surgery

## 2016-07-06 LAB — CBC
HCT: 30.9 % — ABNORMAL LOW (ref 39.0–52.0)
Hemoglobin: 10.2 g/dL — ABNORMAL LOW (ref 13.0–17.0)
MCH: 28.6 pg (ref 26.0–34.0)
MCHC: 33 g/dL (ref 30.0–36.0)
MCV: 86.6 fL (ref 78.0–100.0)
Platelets: 162 10*3/uL (ref 150–400)
RBC: 3.57 MIL/uL — ABNORMAL LOW (ref 4.22–5.81)
RDW: 13.2 % (ref 11.5–15.5)
WBC: 9.6 10*3/uL (ref 4.0–10.5)

## 2016-07-06 MED ORDER — METHOCARBAMOL 500 MG PO TABS: 500.0000 mg | ORAL_TABLET | Freq: Four times a day (QID) | ORAL | 0 refills | Status: DC | PRN

## 2016-07-06 NOTE — Progress Notes (Signed)
   Subjective:  Patient reports pain as moderate.  Denies CP/SOB/N/V.  Objective:   VITALS:   Vitals:   07/05/16 0409 07/05/16 1459 07/05/16 2100 07/06/16 0429  BP: (!) 117/55 (!) 106/58 117/68 131/79  Pulse: 71 74 78 68  Resp: 12 14  16   Temp: 98 F (36.7 C) 98.3 F (36.8 C) 98 F (36.7 C) 97.7 F (36.5 C)  TempSrc: Oral Oral Oral Oral  SpO2: 97% 94% 98% 98%  Weight:        NAD ABD soft Sensation intact distally Intact pulses distally Dorsiflexion/Plantar flexion intact Incision: dressing C/D/I Compartment soft   Lab Results  Component Value Date   WBC 9.6 07/06/2016   HGB 10.2 (L) 07/06/2016   HCT 30.9 (L) 07/06/2016   MCV 86.6 07/06/2016   PLT 162 07/06/2016   BMET    Component Value Date/Time   NA 135 07/05/2016 0354   K 4.3 07/05/2016 0354   CL 101 07/05/2016 0354   CO2 26 07/05/2016 0354   GLUCOSE 138 (H) 07/05/2016 0354   BUN 19 07/05/2016 0354   CREATININE 0.82 07/05/2016 0354   CALCIUM 8.3 (L) 07/05/2016 0354   GFRNONAA >60 07/05/2016 0354   GFRAA >60 07/05/2016 0354     Assessment/Plan: 2 Days Post-Op   Principal Problem:   Osteoarthritis of right hip Active Problems:   Primary osteoarthritis of right hip   WBAT with walker PO pain control PT/OT DVT ppx: ASA, SCDs, TEDS Dispo: d/c home with outpatient PT (already set up)   Lee-Anne Flicker, Horald Pollen 07/06/2016, 9:36 AM   Rod Can, MD Cell [phone]

## 2016-07-06 NOTE — Progress Notes (Signed)
Patient and patient's spouse verbalized understanding of discharge instructions and were provided with prescriptions. Patient's IV removed. Patient dressed and ready for discharge. Patient to be taken downstairs in wheelchair by volunteer services.

## 2016-07-06 NOTE — Progress Notes (Signed)
Physical Therapy Treatment Patient Details Name: Jesse [name] MRN: [id number] DOB: 07-May-1941 Today's Date: 07/06/2016    History of Present Illness 76 y.o. male admitted to [facility] on 07/04/16 for elective R direct anterior THA.  Pt with significant PMHx of L posterior THA, restless legs, HOH, A-fib, back surgery, and  R heel fx.      PT Comments    Patient is making progress toward mobility goals. Ambulated farther and performed therex and stair training this session. Wife present. Discussed HEP, use of ice, activity progression, and positioning to improve pt's R knee extension. Current plan remains appropriate.   Follow Up Recommendations  Home health PT;Supervision for mobility/OOB     Equipment Recommendations  None recommended by PT    Recommendations for Other Services       Precautions / Restrictions Precautions Precautions: None Restrictions Weight Bearing Restrictions: Yes RLE Weight Bearing: Weight bearing as tolerated    Mobility  Bed Mobility               General bed mobility comments: pt in restroom upon arrival   Transfers Overall transfer level: Modified independent Equipment used: Rolling walker (2 wheeled) Transfers: Sit to/from Stand              Ambulation/Gait Ambulation/Gait assistance: Supervision Ambulation Distance (Feet): 180 Feet Assistive device: Rolling walker (2 wheeled) Gait Pattern/deviations: Step-through pattern;Trunk flexed;Decreased stance time - right;Decreased stride length Gait velocity: decreased   General Gait Details: cues for posture and R heel strike; pt tends to maintain flexed trunk and R knee but able to improve with cues   Stairs Stairs: Yes   Stair Management: No rails;Backwards;With walker Number of Stairs: 4 General stair comments: wife present; min A to stabilize RW but no physical assist to ascend/descend; cues fo rsequencing and technique  Wheelchair Mobility    Modified Rankin (Stroke Patients  Only)       Balance Overall balance assessment: Needs assistance Sitting-balance support: Feet supported;No upper extremity supported Sitting balance-Leahy Scale: Good     Standing balance support: Bilateral upper extremity supported Standing balance-Leahy Scale: Poor                      Cognition Arousal/Alertness: Awake/alert Behavior During Therapy: WFL for tasks assessed/performed Overall Cognitive Status: Within Functional Limits for tasks assessed                      Exercises Total Joint Exercises Quad Sets: AROM;Both;10 reps Heel Slides: AAROM;Right;10 reps Hip ABduction/ADduction: AAROM;Right;10 reps Long Arc Quad: AROM;Right;10 reps    General Comments General comments (skin integrity, edema, etc.): pt reported working on therex this am prior to session      Pertinent Vitals/Pain Pain Assessment: Faces Faces Pain Scale: Hurts even more Pain Location: right hip with therex Pain Descriptors / Indicators: Grimacing;Guarding;Sore;Tightness Pain Intervention(s): Limited activity within patient's tolerance;Monitored during session;Repositioned    Home Living                      Prior Function            PT Goals (current goals can now be found in the care plan section) Acute Rehab PT Goals Patient Stated Goal: go home PT Goal Formulation: With patient/family Time For Goal Achievement: 07/12/16 Potential to Achieve Goals: Good Progress towards PT goals: Progressing toward goals    Frequency    7X/week      PT Plan Current  plan remains appropriate    Co-evaluation             End of Session Equipment Utilized During Treatment: Gait belt Activity Tolerance: Patient tolerated treatment well Patient left: in chair;with call bell/phone within reach;with family/visitor present     Time: [id number] PT Time Calculation (min) (ACUTE ONLY): 47 min  Charges:  $Gait Training: 8-22 mins $Therapeutic Exercise: 8-22  mins $Therapeutic Activity: 8-22 mins                    G Codes:      [place] April, PTA Pager: [phone]   07/06/2016, 10:59 AM

## 2016-07-07 ENCOUNTER — Encounter: Payer: Self-pay | Admitting: Family Medicine

## 2016-07-11 DIAGNOSIS — M25551 Pain in right hip: Secondary | ICD-10-CM | POA: Diagnosis not present

## 2016-07-13 DIAGNOSIS — M25551 Pain in right hip: Secondary | ICD-10-CM | POA: Diagnosis not present

## 2016-07-16 DIAGNOSIS — M25551 Pain in right hip: Secondary | ICD-10-CM | POA: Diagnosis not present

## 2016-07-18 DIAGNOSIS — Z471 Aftercare following joint replacement surgery: Secondary | ICD-10-CM | POA: Diagnosis not present

## 2016-07-18 DIAGNOSIS — M25551 Pain in right hip: Secondary | ICD-10-CM | POA: Diagnosis not present

## 2016-07-18 DIAGNOSIS — Z96641 Presence of right artificial hip joint: Secondary | ICD-10-CM | POA: Diagnosis not present

## 2016-07-20 DIAGNOSIS — M25551 Pain in right hip: Secondary | ICD-10-CM | POA: Diagnosis not present

## 2016-07-23 DIAGNOSIS — M25551 Pain in right hip: Secondary | ICD-10-CM | POA: Diagnosis not present

## 2016-07-25 DIAGNOSIS — M25551 Pain in right hip: Secondary | ICD-10-CM | POA: Diagnosis not present

## 2016-07-27 DIAGNOSIS — M25551 Pain in right hip: Secondary | ICD-10-CM | POA: Diagnosis not present

## 2016-07-31 DIAGNOSIS — M25551 Pain in right hip: Secondary | ICD-10-CM | POA: Diagnosis not present

## 2016-08-02 DIAGNOSIS — M25551 Pain in right hip: Secondary | ICD-10-CM | POA: Diagnosis not present

## 2016-08-07 ENCOUNTER — Ambulatory Visit (INDEPENDENT_AMBULATORY_CARE_PROVIDER_SITE_OTHER): Payer: Self-pay | Admitting: Podiatry

## 2016-08-07 DIAGNOSIS — M25551 Pain in right hip: Secondary | ICD-10-CM | POA: Diagnosis not present

## 2016-08-07 DIAGNOSIS — M79676 Pain in unspecified toe(s): Secondary | ICD-10-CM

## 2016-08-07 DIAGNOSIS — B351 Tinea unguium: Secondary | ICD-10-CM

## 2016-08-07 DIAGNOSIS — L603 Nail dystrophy: Secondary | ICD-10-CM

## 2016-08-07 NOTE — Progress Notes (Signed)
He presents today to complaint of painful elongated toenails just getting of her right hip replacement.  Objective: Vital signs are stable he is alert and oriented 3 pulses are palpable bilateral. Minimal edema bilateral.  Assessment: Pain and limps a onychomycosis.  Plan: Debridement of toenails 1 through 5 bilateral for service pain follow-up in 3 months

## 2016-08-09 DIAGNOSIS — M25551 Pain in right hip: Secondary | ICD-10-CM | POA: Diagnosis not present

## 2016-08-22 DIAGNOSIS — Z96641 Presence of right artificial hip joint: Secondary | ICD-10-CM | POA: Diagnosis not present

## 2016-08-22 DIAGNOSIS — Z471 Aftercare following joint replacement surgery: Secondary | ICD-10-CM | POA: Diagnosis not present

## 2016-08-27 DIAGNOSIS — C61 Malignant neoplasm of prostate: Secondary | ICD-10-CM | POA: Diagnosis not present

## 2016-08-30 ENCOUNTER — Encounter: Payer: Self-pay | Admitting: Family Medicine

## 2016-09-03 DIAGNOSIS — C61 Malignant neoplasm of prostate: Secondary | ICD-10-CM | POA: Diagnosis not present

## 2016-09-24 ENCOUNTER — Encounter: Payer: Self-pay | Admitting: Nurse Practitioner

## 2016-09-25 ENCOUNTER — Ambulatory Visit (INDEPENDENT_AMBULATORY_CARE_PROVIDER_SITE_OTHER): Payer: Medicare Other | Admitting: Nurse Practitioner

## 2016-09-25 ENCOUNTER — Other Ambulatory Visit: Payer: Self-pay

## 2016-09-25 ENCOUNTER — Encounter: Payer: Self-pay | Admitting: Nurse Practitioner

## 2016-09-25 ENCOUNTER — Telehealth: Payer: Self-pay

## 2016-09-25 VITALS — HR 76 | Wt 171.0 lb

## 2016-09-25 DIAGNOSIS — R194 Change in bowel habit: Secondary | ICD-10-CM

## 2016-09-25 DIAGNOSIS — K5010 Crohn's disease of large intestine without complications: Secondary | ICD-10-CM

## 2016-09-25 NOTE — Telephone Encounter (Signed)
Created in error

## 2016-09-25 NOTE — Progress Notes (Signed)
HPI:  Patient is a 76 year old male, new to this practice, here for evaluation of bowel changes. Patient is here with his wife, a retired Marine scientist who provides much of the history. Patient is s/p multiple radiation treatments last year for prostate cancer. He was recently taking narcotics and NSAIDS after hip surgery. On pain medications patient developed constipation. Three weeks ago he stopped narcotics and NSAIDS. His bowel movements now are irregular. He doesn't drink much water but eats fruit and fiber containing food.  Patient's wife keeps close track of patient's food and fluid intake as well as his bowel movements. There is some disagreement between patient and his wife regarding patient's bowel habits. According to the patient he typically passes several pieces of broken stool in the am. Then throughout the day he has a lot of urgency associated with passage of gas and small amounts of loose stool. He hasn't noticed any blood or mucous in the stool. He had a colonoscopy in [place] in 2011. Per patient no polyps found.     Past Medical History:  Diagnosis Date  . Arthritis   . Atrial fibrillation ([place])   . Complication of anesthesia    Atrial fibrillation during surgery   . Hard of hearing   . Hx of constipation   . Prostate cancer ([place])   . Restless legs   . Thyroid disease    hypothyroidism  . Vitiligo      Past Surgical History:  Procedure Laterality Date  . BACK SURGERY    . COLONOSCOPY    . JOINT REPLACEMENT    . LAMINECTOMY  10/2011   cervical and lumbar laminectomy  . PROSTATE BIOPSY  07/10/2011  . PROSTATE BIOPSY  09/22/15  . TOTAL HIP ARTHROPLASTY Right 02/29/2012  . TOTAL HIP ARTHROPLASTY Right 07/04/2016   Procedure: RIGHT TOTAL HIP ARTHROPLASTY ANTERIOR APPROACH;  Surgeon: Rod Can, Jesse [name];  Location: [facility];  Service: Orthopedics;  Laterality: Right;  . TRANSURETHRAL RESECTION OF PROSTATE     Family History  Problem Relation Age of Onset  . Atrial fibrillation  Mother   . Atrial fibrillation Sister   . Cancer Neg Hx    Social History  Substance Use Topics  . Smoking status: Former Smoker    Types: Cigarettes    Quit date: 05/28/1974  . Smokeless tobacco: Never Used  . Alcohol use Yes     Comment: 4-5 standard drinks per week   Current Outpatient Prescriptions  Medication Sig Dispense Refill  . docusate sodium (COLACE) 100 MG capsule Take 1 capsule (100 mg total) by mouth 2 (two) times daily. 10 capsule 0  . Glucosamine HCl-MSM (GLUCOSAMINE-MSM PO) Take 1 tablet by mouth daily.    . Glucosamine-Chondroit-Vit C-Mn (GLUCOSAMINE-CHONDROITIN) TABS Take 1 tablet by mouth daily.    Marland Kitchen levothyroxine (SYNTHROID, LEVOTHROID) 50 MCG tablet Take 50 mcg by mouth daily before breakfast.     . Multiple Vitamins-Minerals (MULTIVITAMIN ADULT PO) Take 1 tablet by mouth daily. One a Day     No current facility-administered medications for this visit.    No Known Allergies   Review of Systems: All systems reviewed and negative except where noted in HPI.    Physical Exam: Pulse 76   Wt 171 lb (77.6 kg)   SpO2 98%   BMI 23.85 kg/m  Constitutional:  Well-developed, white male male in no acute distress. Psychiatric: Normal mood and affect. Behavior is normal. HEENT:  Conjunctivae are normal. No scleral icterus. Neck supple.  Cardiovascular: Normal rate, regular rhythm.  Pulmonary/chest: Effort normal and breath sounds normal. No wheezing, rales or rhonchi. Abdominal: Soft, nondistended, nontender. Bowel sounds active throughout. There are no masses palpable. No hepatomegaly. Extremities: no edema Lymphadenopathy: No cervical adenopathy noted. Neurological: Alert and oriented to person place and time. Skin: Skin is warm and dry. No rashes noted.   ASSESSMENT AND PLAN:  1. 76 yo male with bowel changes. Recently constipated on pain medications but off narcotics for three weeks and bowel movements haven't normalized. Overall it sounds like he is still  constipated with incomplete emptying in the am leading to urgency with excessive gas and smaller loose stools during the day. Given history of radiation for prostate cancer I think radiation proctitis is on list of DDx if this turns out not to be constipation.  It should definitely be considered if patient passes any blood / mucous with BMs  -He needs to increase water intake -Add Miralax, start with TIW and titrate up to once daily for effect.  -I have sent request for [place] colonoscopy report -Depending on clinical course and findings from last colonoscopy he may need repeat colonoscopy.  -Patient will follow up with me in clinic in a couple of weeks, or sooner if symptoms worsen.Marland Kitchen He has requested that Dr. Havery Moros be his Gastroenterologist   Jesse Savoy, Jesse [name]  09/25/2016, 9:55 AM   ADDENDUM:  Just received Colonoscopy received from [facility] by Jesse Fret, Jesse [name] 03/14/10 Extent to cecum.Mild diverticulosis in left colon. Entire colon o/w normal Quality of prep not stated.

## 2016-09-25 NOTE — Patient Instructions (Addendum)
If you are age 77 or older, your body mass index should be between 23-30. Your Body mass index is 23.85 kg/m. If this is out of the aforementioned range listed, please consider follow up with your Primary Care Provider.  If you are age 76 or younger, your body mass index should be between 19-25. Your Body mass index is 23.85 kg/m. If this is out of the aformentioned range listed, please consider follow up with your Primary Care Provider.   Start Miralax (over-the-counter) in the morning three times a week; may titrate to daily.  Start Gas X as needed.  You have been given low gas diet literature.  Follow up with [name], NP on Oct 09, 2016 at 1030 am.  Thank you for choosing me and [facility].  [name], NP

## 2016-10-03 NOTE — Progress Notes (Signed)
Agree with assessment and plan as outlined. If patient's symptoms of urgency persist despite management then will need to consider flex sig to assess for radiation proctitis, although this would not be expected to cause his constipation. Will await course on bowel regimen, if symptoms persist can schedule flex sig. Thanks

## 2016-10-09 ENCOUNTER — Encounter: Payer: Self-pay | Admitting: Podiatry

## 2016-10-09 ENCOUNTER — Encounter: Payer: Self-pay | Admitting: Nurse Practitioner

## 2016-10-09 ENCOUNTER — Ambulatory Visit (INDEPENDENT_AMBULATORY_CARE_PROVIDER_SITE_OTHER): Payer: Medicare Other | Admitting: Nurse Practitioner

## 2016-10-09 ENCOUNTER — Ambulatory Visit (INDEPENDENT_AMBULATORY_CARE_PROVIDER_SITE_OTHER): Payer: Medicare Other | Admitting: Podiatry

## 2016-10-09 VITALS — BP 116/66 | HR 75 | Ht 71.0 in | Wt 169.0 lb

## 2016-10-09 DIAGNOSIS — B351 Tinea unguium: Secondary | ICD-10-CM

## 2016-10-09 DIAGNOSIS — M79676 Pain in unspecified toe(s): Secondary | ICD-10-CM | POA: Diagnosis not present

## 2016-10-09 DIAGNOSIS — R194 Change in bowel habit: Secondary | ICD-10-CM

## 2016-10-09 NOTE — Patient Instructions (Signed)
If you are age 77 or older, your body mass index should be between 23-30. Your Body mass index is 23.57 kg/m. If this is out of the aforementioned range listed, please consider follow up with your Primary Care Provider.  If you are age 1 or younger, your body mass index should be between 19-25. Your Body mass index is 23.57 kg/m. If this is out of the aformentioned range listed, please consider follow up with your Primary Care Provider.   Take Miralax every other day for the next 3-4 weeks.  If no bowel movement in two days, start taking Miralax everyday.  Follow up appointment with Dr. Havery Moros on  November 13, 2016 at 1000 am.  Thank you for choosing me and [facility].  [name], NP

## 2016-10-09 NOTE — Progress Notes (Signed)
Presents today chief complaint of painful elongated toenails.  Objective: Vital signs are stable oriented 3 pulses are palpable. Her nails are long thick yellow dystrophic mycotic painful palpation.  Assessment: Pain limiting her onychomycosis.  Plan: Debridement of toenails 1 through 5 bilateral.

## 2016-10-14 NOTE — Progress Notes (Signed)
HPI: Patient is a 76 yo male who was seen here for the first time a couple of weeks ago for evaluation of bowel changes. He had developed constipation on narcotics being taken post hip replacement. After stopping narcotics his bowel movements did not return to normal. He developed a pattern of passing several pieces of broken stools in the am followed later in the day by episodes of urgency with passage of gas and small amounts of loose stool  Overall presentation was most compatible with constipation / incomplete evacuation.  He had not passed any blood but the associated urgency raised suspicion for radiation proctitis. He was given a trial of Mirlax, Gas-x and a low gas diet Patient back with his wife for follow up.   Patient took the miralax for a week then discontinued it after reading on package that it should not be taken more than a week at a time During the week that he took the Miralax he did have at least two large bowel movements and the urgency resolved. Gas-x wasn't very helpful but the low gas diet was beneficial.  Patient's wife is very involved with details of his care. She feels patient eats excessive amounts of desserts, doesn't drink enough fluids. She feels he is depressed, overwhelmed with stress / anxiety.    Past Medical History:  Diagnosis Date  . Arthritis   . Atrial fibrillation ([place])   . Complication of anesthesia    Atrial fibrillation during surgery   . Hard of hearing   . Hx of constipation   . Prostate cancer ([place])   . Restless legs   . Thyroid disease    hypothyroidism  . Vitiligo     Patient's surgical history, family medical history, social history, medications and allergies were all reviewed in Epic    Physical Exam: BP 116/66 (BP Location: Left Arm, Patient Position: Sitting, Cuff Size: Normal)   Pulse 75   Ht 5\' 11"  (1.803 m)   Wt 169 lb (76.7 kg)   BMI 23.57 kg/m   GENERAL: well developed white male in NAD PSYCH: :Pleasant, cooperative,  normal affect HEENT:  conjunctiva pink, mucous membranes moist, neck supple without masses CARDIAC:  RRR,  no murmur heard, no peripheral edema PULM: Normal respiratory effort, lungs CTA bilaterally, no wheezing ABDOMEN:  soft, nontender, nondistended, no obvious masses,  normal bowel sounds SKIN:  turgor, no lesions seen Musculoskeletal:  Normal muscle tone, normal strength NEURO: Alert and oriented x 3, no focal neurologic deficits   ASSESSMENT and PLAN:  1. 76 yo male with bowel changes here for follow up. Overall symptoms seemed most compatible with constipation.  After trying miralax he did have some large formed stools and resolution of urgency. The low gas diet  helped with the gas.  Patient unaware that miralax could be taken longer than a week so he stopped it   -Symptoms were improving with miralax but patient didn't take it long enough to assess durability of the response. I've asked him to resume the miralax, at least every other day. He needs to increase water intake as well. If doing better on Miralax and low gas diet then patient will see Dr. Havery Moros late June for follow up . If not improving or patient develops new symptoms such as rectal bleeding / tenesmus, then he will call us in the interim to be scheduled for a colonoscopy. [place] with findings of mild diverticulosis, no polyps.  2. Hx of prostate cancer. He is s/p radiation last year   [name] , NP 10/14/2016, 9:47 PM

## 2016-10-15 NOTE — Progress Notes (Signed)
Agree with assessment and plan as outlined. He should continue course with Miralax. I will see him next month in clinic for reassessment. If symptoms persist we can discuss endoscopic evaluation. Thanks

## 2016-10-29 ENCOUNTER — Encounter (HOSPITAL_COMMUNITY): Payer: Self-pay | Admitting: Orthopedic Surgery

## 2016-10-29 NOTE — Addendum Note (Signed)
Addendum  created 10/29/16 1111 by Oleta Mouse, MD   Sign clinical note

## 2016-11-13 ENCOUNTER — Encounter: Payer: Self-pay | Admitting: Gastroenterology

## 2016-11-13 ENCOUNTER — Ambulatory Visit (INDEPENDENT_AMBULATORY_CARE_PROVIDER_SITE_OTHER): Payer: Medicare Other | Admitting: Gastroenterology

## 2016-11-13 VITALS — BP 110/70 | HR 72 | Ht 71.0 in | Wt 161.4 lb

## 2016-11-13 DIAGNOSIS — R194 Change in bowel habit: Secondary | ICD-10-CM | POA: Diagnosis not present

## 2016-11-13 NOTE — Patient Instructions (Signed)
If you are age 77 or older, your body mass index should be between 23-30. Your Body mass index is 22.51 kg/m. If this is out of the aforementioned range listed, please consider follow up with your Primary Care Provider.  If you are age 1 or younger, your body mass index should be between 19-25. Your Body mass index is 22.51 kg/m. If this is out of the aformentioned range listed, please consider follow up with your Primary Care Provider.   Please follow up as needed.  Recall Colon October of 2021.  Thank you.

## 2016-11-13 NOTE — Progress Notes (Signed)
HPI :  76 year old male here for follow-up visit. He is a new patient to me, previously seen by [name] for bowel habit changes.  He was first seen on May 1 at which point he endorsed some constipation in the setting of recent narcotic use. He has interval he stopped narcotics and use MiraLAX which worked quite well to regularize his bowels. He reports he stopped using MiraLAX routinely and is now taking it once every other day.  His wife is a Marine scientist and keeps a meticulous log of his bowel movements. He has taken miralax only 2 times this month.   He is having fairly regular bowel habits, usually once per day. He is having a bowel movement most days of the week at this time, soft or formed. He denies any blood in the stools. He has completed XRT for prostate cancer this past year. He is aware of low fodmap diet for some gas he was having with particular foods  Wife thinks he is depressed due to flat affect. Patient denies feeling depressed today but did not want to elaborate. He did mention he thought I would admit him to the hospital today but would not say why he thought this.   He reports he becomes dyspnic with stairs at times. He denies chest pains. He is not sure how long this been going on, could not elaborate. He feels it when he does yard work. Wife reports he had a stress test after experiencing A fib in 2013. EKG normal 05/2016. Wife does not think this is a new complaint.   Colonoscopy 02/2010 - mild diverticulosis, no polyps   Past Medical History:  Diagnosis Date  . Arthritis   . Atrial fibrillation ([place])   . Complication of anesthesia    Atrial fibrillation during surgery   . Hard of hearing   . Hx of constipation   . Prostate cancer ([place])   . Restless legs   . Thyroid disease    hypothyroidism  . Vitiligo      Past Surgical History:  Procedure Laterality Date  . BACK SURGERY    . COLONOSCOPY    . JOINT REPLACEMENT    . LAMINECTOMY  10/2011   cervical and  lumbar laminectomy  . PROSTATE BIOPSY  07/10/2011  . PROSTATE BIOPSY  09/22/15  . TOTAL HIP ARTHROPLASTY Right 02/29/2012  . TOTAL HIP ARTHROPLASTY Right 07/04/2016   Procedure: RIGHT TOTAL HIP ARTHROPLASTY ANTERIOR APPROACH;  Surgeon: Rod Can, MD;  Location: [facility];  Service: Orthopedics;  Laterality: Right;  . TRANSURETHRAL RESECTION OF PROSTATE     Family History  Problem Relation Age of Onset  . Atrial fibrillation Mother   . Atrial fibrillation Sister   . Cancer Neg Hx    Social History  Substance Use Topics  . Smoking status: Former Smoker    Types: Cigarettes    Quit date: 05/28/1974  . Smokeless tobacco: Never Used  . Alcohol use Yes     Comment: 4-5 standard drinks per week   Current Outpatient Prescriptions  Medication Sig Dispense Refill  . Glucosamine HCl-MSM (GLUCOSAMINE-MSM PO) Take 1 tablet by mouth daily.    . Glucosamine-Chondroit-Vit C-Mn (GLUCOSAMINE-CHONDROITIN) TABS Take 1 tablet by mouth daily.    Marland Kitchen levothyroxine (SYNTHROID, LEVOTHROID) 50 MCG tablet Take 50 mcg by mouth daily before breakfast.     . Multiple Vitamins-Minerals (MULTIVITAMIN ADULT PO) Take 1 tablet by mouth daily. One a Day    . polyethylene glycol (MIRALAX / GLYCOLAX)  packet Take 17 g by mouth daily as needed.     No current facility-administered medications for this visit.    No Known Allergies   Review of Systems: All systems reviewed and negative except where noted in HPI.    Physical Exam: BP 110/70   Pulse 72   Ht 5\' 11"  (1.803 m)   Wt 161 lb 6.4 oz (73.2 kg)   BMI 22.51 kg/m  Constitutional: Pleasant,male in no acute distress. HEENT: Normocephalic and atraumatic. Conjunctivae are normal. No scleral icterus. Neck supple.  Cardiovascular: Normal rate, regular rhythm.  Pulmonary/chest: Effort normal and breath sounds normal. No wheezing, rales or rhonchi. Abdominal: Soft, nondistended, nontender.  . No hepatomegaly. Extremities: no edema Lymphadenopathy: No cervical  adenopathy noted. Neurological: Alert and oriented to person place and time. Skin: Skin is warm and dry. No rashes noted. Psychiatric: Normal mood and affect. Behavior is normal.   ASSESSMENT AND PLAN: 76 year old male here for reassessment of bowel habit changes previously noted 1-2 months ago. He very likely had constipation induced by narcotics which has since resolved after stopping narcotics and using MiraLAX. Now using MiraLAX relatively infrequently with pretty normal bowel habits, no symptoms of bleeding. His colonoscopy in 2011 showed no polyps. While he does have a history of radiation therapy to the prostate, he has no symptoms concerning for radiation proctitis.  Counseled patient and wife that Merril Abbe is safe to use as needed. His symptoms have resolved, he can follow-up as needed. He is not due for colon cancer screening until 2021 unless he has symptoms sooner that warrant evaluation. At that age he'll be 76 years old and likely he may not need further screening but can discuss in clinic at that time.  Otherwise his wife is concerned that he is depressed. I questioned the patient about this and he denied feeling depressed however he defers to his wife to answer mostly all of these questions and did not elaborate on this. In regards to his dyspnea this also appears chronic, wife does not seem concerned about this, however recommend they follow up with his primary care to discuss both of these issues. He can follow up with [place] as needed.   Tea Cellar, MD [facility] Pager [phone]

## 2016-12-13 NOTE — Progress Notes (Signed)
ACUTE VISIT   HPI:  No chief complaint on file.   Jesse [name] is a 76 y.o. male, who is here today with Jesse [name], who is concerned about Jesse mood changes in the past 4 weeks. Jesse [name] did not want to come, Jesse [name] scheduled appt. Jesse [name] provides history today.  Jesse [name] was last seen on 06/05/16 when Jesse [name] established care, pre op evaluation was requested at that time by ortho for right hip surgery. Since Jesse last OV Jesse [name] has followed with GI, Dr Jesse [name].  "Slow" speech, no motivated to do things around the house Jesse [name] used to enjoy, easily frustrated, sleeping during the day, decreased appetite, and wt loss. Jesse [name] has lost about 7 Lb since symptoms started. Jesse [name] has told Jesse [name] Jesse [name] is afraid of died.  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   Jesse [name] had remote Hx of depression in Jesse early 20's after Jesse [name] came back from [place] [place]. No Hx of psychiatric hospitalizations or suicidal attempt.   Depression       The patient presents with depression.  This is a recurrent problem.  The current episode started 1 to 4 weeks ago.   The onset quality is gradual.   The problem occurs constantly.  The problem has been gradually worsening since onset.  Associated symptoms include fatigue, helplessness, hopelessness, insomnia, irritable, restlessness, decreased interest, appetite change, indigestion and sad.  Associated symptoms include no body aches, no myalgias, no headaches and no suicidal ideas.  Past medical history includes depression.    According to [name],Jesse [name] did great after hip surgery,now Jesse [name] can walk better and has no pain.   Jesse [name] has had some GI problems, "gas" and constipation. Miralax has helped with constipation but Jesse [name] is still having "a lot of gas." Jesse [name] gets up at night 3-4 times because bloating and urgency for having a bowel movement and pass gas. Symptoms are  relieved by passing gas, sometimes Jesse [name] has a "small" bowel  movement. During the day Jesse [name] has formed bowel movements medium to big size stools.Occasionally Jesse [name] is having loose stools.  Diffuse abdominal cramps, alleviated by passing flatus and defecation.  No recent abx treatment or overseas travel. No new medications or dietary changes.  Jesse [name] is afraid of eating because it exacerbates GI symptoms.  Denies fever,chills, odynophagia,or dysphagia, nausea, vomiting,blood in stool or melena.  Hx of hypothyroidism, Jesse [name] is currently on Levothyroxine 50 mcg daily. Jesse [name] follows with endocrinologists.  Hx of prostate cancer, s/p radiation therapy.Jesse [name] follows with urologists periodically.   Review of Systems  Constitutional: Positive for appetite change and fatigue. Negative for fever.  HENT: Negative for facial swelling, mouth sores, nosebleeds, sore throat and trouble swallowing.   Eyes: Negative for redness and visual disturbance.  Respiratory: Negative for cough, shortness of breath and wheezing.   Cardiovascular: Negative for chest pain, palpitations and leg swelling.  Gastrointestinal: Positive for abdominal pain. Negative for blood in stool, nausea and vomiting.  Endocrine: Negative for polydipsia, polyphagia and polyuria.  Genitourinary: Negative for decreased urine volume, dysuria and hematuria.  Musculoskeletal: Negative for back pain and myalgias.  Skin: Negative for pallor and rash.  Neurological: Negative for syncope, weakness and headaches.  Hematological: Negative for adenopathy. Does not bruise/bleed easily.  Psychiatric/Behavioral: Positive for depression and sleep disturbance. Negative for confusion, hallucinations and suicidal ideas. The patient is nervous/anxious and has insomnia.      Current Outpatient Prescriptions  on File Prior to Visit  Medication Sig Dispense Refill  . Glucosamine HCl-MSM (GLUCOSAMINE-MSM PO) Take 1 tablet by mouth daily.    . Glucosamine-Chondroit-Vit C-Mn (GLUCOSAMINE-CHONDROITIN) TABS Take 1 tablet by mouth  daily.    Marland Kitchen levothyroxine (SYNTHROID, LEVOTHROID) 50 MCG tablet Take 50 mcg by mouth daily before breakfast.     . Multiple Vitamins-Minerals (MULTIVITAMIN ADULT PO) Take 1 tablet by mouth daily. One a Day    . polyethylene glycol (MIRALAX / GLYCOLAX) packet Take 17 g by mouth daily as needed.     No current facility-administered medications on file prior to visit.      Past Medical History:  Diagnosis Date  . Arthritis   . Atrial fibrillation ([place])   . Complication of anesthesia    Atrial fibrillation during surgery   . Hard of hearing   . Hx of constipation   . Prostate cancer ([place])   . Restless legs   . Thyroid disease    hypothyroidism  . Vitiligo    No Known Allergies  Social History   Social History  . Marital status: Married    Spouse name: N/A  . Number of children: N/A  . Years of education: N/A   Social History Main Topics  . Smoking status: Former Smoker    Types: Cigarettes    Quit date: 05/28/1974  . Smokeless tobacco: Never Used  . Alcohol use Yes     Comment: 4-5 standard drinks per week  . Drug use: No  . Sexual activity: Yes   Other Topics Concern  . None   Social History Narrative  . None    Vitals:   12/14/16 0832  BP: 118/74  Pulse: 77  Resp: 12  O2 sat at RA 97% Body mass index is 21.93 kg/m.   Physical Exam  Nursing note and vitals reviewed. Constitutional: Jesse [name] is oriented to person, place, and time. Jesse [name] appears well-developed and well-nourished. Jesse [name] is irritable. No distress.  HENT:  Head: Atraumatic.  Mouth/Throat: Oropharynx is clear and moist and mucous membranes are normal.  Eyes: Pupils are equal, round, and reactive to light. Conjunctivae are normal.  Neck: No tracheal deviation present. No thyroid mass and no thyromegaly present.  Cardiovascular: Normal rate and regular rhythm.   No murmur heard. Respiratory: Effort normal and breath sounds normal. No respiratory distress.  GI: Soft. Jesse [name] exhibits no mass. Bowel sounds are  increased. There is no hepatomegaly. There is no tenderness.  Musculoskeletal: Jesse [name] exhibits no edema.  Lymphadenopathy:    Jesse [name] has no cervical adenopathy.  Neurological: Jesse [name] is alert and oriented to person, place, and time. Jesse [name] has normal strength.  Stable gait, not assisted.  Skin: Skin is warm. No rash noted. No erythema.  Hypopigmented macular areas on hands (vitiligo).  Psychiatric: Jesse [name] is slowed. Cognition and memory are normal. Jesse [name] exhibits a depressed mood. Jesse [name] expresses no suicidal ideation. Jesse [name] expresses no suicidal plans.  Appropriately groomed, poor eye contact.    ASSESSMENT AND PLAN:   Diagnoses and all orders for this visit:    Chemistry      Component Value Date/Time   NA 134 (L) 12/14/2016 0920   K 4.5 12/14/2016 0920   CL 101 12/14/2016 0920   CO2 26 12/14/2016 0920   BUN 20 12/14/2016 0920   CREATININE 0.85 12/14/2016 0920      Component Value Date/Time   CALCIUM 9.4 12/14/2016 0920   ALKPHOS 60 12/14/2016 0920   AST 18 12/14/2016 0920   ALT  12 12/14/2016 0920   BILITOT 0.7 12/14/2016 0920     Lab Results  Component Value Date   TSH 1.99 12/14/2016   Lab Results  Component Value Date   WBC 4.5 12/14/2016   HGB 14.1 12/14/2016   HCT 41.8 12/14/2016   MCV 85.7 12/14/2016   PLT 223.0 12/14/2016    Abdominal pain, periumbilical  ? Diverticulosis,IBS,anxiety among some discussed. Avoid certain foods that can increase gas production. Miralax as needed q 2 days, hold if having diarrhea. Colonoscopy in 2011. Further recommendations will be given according to lab results. Continue following with GI.   -     Comprehensive metabolic panel -     CBC with Differential/Platelet  Acquired hypothyroidism  No changes in current management, will follow labs done today and will give further recommendations accordingly.  -     TSH  Depression, major, single episode, severe ([place])  I recommend low dose Sertraline but Jesse [name] refused medications.  We dicussed  benefits and some side effects. Jesse mother's [name] takes Remeron and she would like for him to try but Jesse [name] is not interested. I will see him back in 2 weeks. [name] instructed about warning signs.     Return in about 4 weeks (around 01/11/2017) for Please appt with psychologist, Dr Glennon Hamilton .     -Mr.Lillia Mountain was advised to seek immediate medical attention if sudden worsening symptoms.       Sunil Hue G. Martinique, MD  [facility]. [place] office.

## 2016-12-14 ENCOUNTER — Ambulatory Visit (INDEPENDENT_AMBULATORY_CARE_PROVIDER_SITE_OTHER): Payer: Medicare Other | Admitting: Family Medicine

## 2016-12-14 ENCOUNTER — Encounter: Payer: Self-pay | Admitting: Family Medicine

## 2016-12-14 VITALS — BP 118/74 | HR 77 | Resp 12 | Ht 71.0 in | Wt 157.2 lb

## 2016-12-14 DIAGNOSIS — F322 Major depressive disorder, single episode, severe without psychotic features: Secondary | ICD-10-CM | POA: Diagnosis not present

## 2016-12-14 DIAGNOSIS — R1033 Periumbilical pain: Secondary | ICD-10-CM | POA: Diagnosis not present

## 2016-12-14 DIAGNOSIS — E039 Hypothyroidism, unspecified: Secondary | ICD-10-CM

## 2016-12-14 LAB — COMPREHENSIVE METABOLIC PANEL
ALT: 12 U/L (ref 0–53)
AST: 18 U/L (ref 0–37)
Albumin: 4.3 g/dL (ref 3.5–5.2)
Alkaline Phosphatase: 60 U/L (ref 39–117)
BUN: 20 mg/dL (ref 6–23)
CO2: 26 mEq/L (ref 19–32)
Calcium: 9.4 mg/dL (ref 8.4–10.5)
Chloride: 101 mEq/L (ref 96–112)
Creatinine, Ser: 0.85 mg/dL (ref 0.40–1.50)
GFR: 92.86 mL/min (ref >60.00)
Glucose, Bld: 97 mg/dL (ref 70–99)
Potassium: 4.5 mEq/L (ref 3.5–5.1)
Sodium: 134 mEq/L — ABNORMAL LOW (ref 135–145)
Total Bilirubin: 0.7 mg/dL (ref 0.2–1.2)
Total Protein: 6.2 g/dL (ref 6.0–8.3)

## 2016-12-14 LAB — CBC WITH DIFFERENTIAL/PLATELET
Basophils Absolute: 0 10*3/uL (ref 0.0–0.1)
Basophils Relative: 0.5 % (ref 0.0–3.0)
Eosinophils Absolute: 0 10*3/uL (ref 0.0–0.7)
Eosinophils Relative: 0.7 % (ref 0.0–5.0)
HCT: 41.8 % (ref 39.0–52.0)
Hemoglobin: 14.1 g/dL (ref 13.0–17.0)
Lymphocytes Relative: 12 % (ref 12.0–46.0)
Lymphs Abs: 0.5 10*3/uL — ABNORMAL LOW (ref 0.7–4.0)
MCHC: 33.8 g/dL (ref 30.0–36.0)
MCV: 85.7 fl (ref 78.0–100.0)
Monocytes Absolute: 0.6 10*3/uL (ref 0.1–1.0)
Monocytes Relative: 13.9 % — ABNORMAL HIGH (ref 3.0–12.0)
Neutro Abs: 3.3 10*3/uL (ref 1.4–7.7)
Neutrophils Relative %: 72.9 % (ref 43.0–77.0)
Platelets: 223 10*3/uL (ref 150.0–400.0)
RBC: 4.87 Mil/uL (ref 4.22–5.81)
RDW: 16.2 % — ABNORMAL HIGH (ref 11.5–15.5)
WBC: 4.5 10*3/uL (ref 4.0–10.5)

## 2016-12-14 LAB — TSH: TSH: 1.99 u[IU]/mL (ref 0.35–4.50)

## 2016-12-14 NOTE — Patient Instructions (Addendum)
A few things to remember from today's visit:   Abdominal pain, periumbilical - Plan: Comprehensive metabolic panel, CBC with Differential/Platelet  Acquired hypothyroidism - Plan: TSH  Please let me know if you want to try medication. Some greens and tomatoes can cause gas.   Please be sure medication list is accurate. If a new problem present, please set up appointment sooner than planned today.

## 2016-12-15 ENCOUNTER — Encounter: Payer: Self-pay | Admitting: Family Medicine

## 2016-12-16 ENCOUNTER — Encounter: Payer: Self-pay | Admitting: Family Medicine

## 2016-12-18 ENCOUNTER — Encounter: Payer: Self-pay | Admitting: Podiatry

## 2016-12-18 ENCOUNTER — Ambulatory Visit (INDEPENDENT_AMBULATORY_CARE_PROVIDER_SITE_OTHER): Payer: Medicare Other | Admitting: Podiatry

## 2016-12-18 DIAGNOSIS — B351 Tinea unguium: Secondary | ICD-10-CM

## 2016-12-18 DIAGNOSIS — M79676 Pain in unspecified toe(s): Secondary | ICD-10-CM | POA: Diagnosis not present

## 2016-12-18 NOTE — Progress Notes (Signed)
He presents today chief complaint of painful elongated toenails.  Objective: Pulses remain palpable no open lesions or wounds are noted.  Assessment: Pain secondary to onychomycosis.  Plan: Debridement of toenails 1 through 5 bilateral.

## 2016-12-19 ENCOUNTER — Encounter: Payer: Self-pay | Admitting: Family Medicine

## 2016-12-21 DIAGNOSIS — C61 Malignant neoplasm of prostate: Secondary | ICD-10-CM | POA: Diagnosis not present

## 2016-12-28 ENCOUNTER — Ambulatory Visit: Payer: Self-pay | Admitting: Family Medicine

## 2016-12-30 NOTE — Progress Notes (Signed)
HPI:   Jesse [name] is a 76 y.o. male, who is here today with his wife to follow on recent OV.   he was seen on 12/14/16, when his wife was very concerned about depression like symptoms. + Fatigue,sleeping more than usual, and lack of motivation. He refused pharmacologic treatment.  Some blood lab work was done,otherwise normal.   Lab Results  Component Value Date   WBC 4.5 12/14/2016   HGB 14.1 12/14/2016   HCT 41.8 12/14/2016   MCV 85.7 12/14/2016   PLT 223.0 12/14/2016     Chemistry      Component Value Date/Time   NA 134 (L) 12/14/2016 0920   K 4.5 12/14/2016 0920   CL 101 12/14/2016 0920   CO2 26 12/14/2016 0920   BUN 20 12/14/2016 0920   CREATININE 0.85 12/14/2016 0920      Component Value Date/Time   CALCIUM 9.4 12/14/2016 0920   ALKPHOS 60 12/14/2016 0920   AST 18 12/14/2016 0920   ALT 12 12/14/2016 0920   BILITOT 0.7 12/14/2016 0920     Lab Results  Component Value Date   TSH 1.99 12/14/2016   According to wife, he is "coming out slowly." He and his wife were in [name] and just came back yesterday. It seemed to help some with mood but far from his baseline. He denies suicidal thoughts. He has appt with psychologists in 02/2017.  He is still not eating as he did before because abdominal bloating sensation and increase gas production. Several times during the day he has to go to the bathroom and pass gas, which provides relief of abdominal discomfort, cramps.  He is having 1-2 bowel movements per day, soft. He is eating prunes or prune juice to help with constipation.  OTC medications have not helped with symptoms. He has not scheduled appt with GI, planing on doing so in 01/2017 after coming back from [place].  Denies nausea, vomiting,blood in stool, or melena. No urinary symptoms.    Review of Systems  Constitutional: Positive for fatigue. Negative for chills, diaphoresis and fever.  HENT: Negative for mouth sores, sore throat and trouble  swallowing.   Respiratory: Negative for shortness of breath and wheezing.   Cardiovascular: Negative for chest pain, palpitations and leg swelling.  Gastrointestinal: Positive for abdominal distention and abdominal pain. Negative for blood in stool, diarrhea, nausea and vomiting.  Endocrine: Negative for cold intolerance and heat intolerance.  Genitourinary: Negative for decreased urine volume, dysuria and hematuria.  Musculoskeletal: Negative for gait problem and myalgias.  Neurological: Negative for syncope, weakness and headaches.  Psychiatric/Behavioral: Negative for confusion, hallucinations, sleep disturbance and suicidal ideas. The patient is nervous/anxious.       Current Outpatient Prescriptions on File Prior to Visit  Medication Sig Dispense Refill  . Glucosamine HCl-MSM (GLUCOSAMINE-MSM PO) Take 1 tablet by mouth daily.    . Glucosamine-Chondroit-Vit C-Mn (GLUCOSAMINE-CHONDROITIN) TABS Take 1 tablet by mouth daily.    Marland Kitchen levothyroxine (SYNTHROID, LEVOTHROID) 50 MCG tablet Take 50 mcg by mouth daily before breakfast.     . Multiple Vitamins-Minerals (MULTIVITAMIN ADULT PO) Take 1 tablet by mouth daily. One a Day    . polyethylene glycol (MIRALAX / GLYCOLAX) packet Take 17 g by mouth daily as needed.     No current facility-administered medications on file prior to visit.      Past Medical History:  Diagnosis Date  . Arthritis   . Atrial fibrillation ([place])   . Complication of  anesthesia    Atrial fibrillation during surgery   . Hard of hearing   . Hx of constipation   . Prostate cancer ([place])   . Restless legs   . Thyroid disease    hypothyroidism  . Vitiligo    No Known Allergies  Social History   Social History  . Marital status: Married    Spouse name: N/A  . Number of children: N/A  . Years of education: N/A   Social History Main Topics  . Smoking status: Former Smoker    Types: Cigarettes    Quit date: 05/28/1974  . Smokeless tobacco: Never Used  .  Alcohol use Yes     Comment: 4-5 standard drinks per week  . Drug use: No  . Sexual activity: Yes   Other Topics Concern  . None   Social History Narrative  . None    Vitals:   12/31/16 1413  BP: 100/70  Pulse: 72  Resp: 12  O2 sat at RA 98% Body mass index is 21.91 kg/m.  Wt Readings from Last 3 Encounters:  12/31/16 157 lb 2 oz (71.3 kg)  12/14/16 157 lb 4 oz (71.3 kg)  11/13/16 161 lb 6.4 oz (73.2 kg)    Physical Exam  Nursing note and vitals reviewed. Constitutional: He is oriented to person, place, and time. He appears well-developed and well-nourished. No distress.  HENT:  Head: Atraumatic.  Mouth/Throat: Oropharynx is clear and moist and mucous membranes are normal.  Eyes: Pupils are equal, round, and reactive to light. Conjunctivae are normal.  Cardiovascular: Normal rate and regular rhythm.   No murmur heard. Respiratory: Effort normal and breath sounds normal. No respiratory distress.  GI: Soft. He exhibits no mass. There is no hepatomegaly. There is no tenderness.  Hyperactive bowel sounds.  Musculoskeletal: He exhibits no edema or tenderness.  Lymphadenopathy:    He has no cervical adenopathy.  Neurological: He is alert and oriented to person, place, and time. He has normal strength.  Stable gait with no assistance.  Skin: Skin is warm. No erythema.  Psychiatric: He exhibits a depressed mood. He expresses no suicidal ideation.  Well groomed, good eye contact. Today he is participating in conversation.     ASSESSMENT AND PLAN:   Mr. Clevester was seen today for follow-up.  Diagnoses and all orders for this visit:  Abdominal bloating with cramps  Not better with dietary changes or OTC medications. He agrees with trying Bentyl, starting with 5 mg tid. Continue small meals, avoid those foods that may increase gas production. Instructed about warning signs. Schedule appt with GI. His wife will let me know in 2 week how he is doing with  medication.   -     dicyclomine (BENTYL) 10 MG/5ML syrup; Take 2.5 mLs (5 mg total) by mouth 3 (three) times daily before meals.  Depression, major, single episode, severe ([place])  Still symptomatic, not suicidal. Wt is stable. Some treatment options discussed but he is not interested in medication. He has appt with Dr Glennon Hamilton. He will let me know if he is willing to try medication. His wife wants to schedule f/u appt, so I will see him back in 3-4 weeks. Instructed about warning signs.    25 min face to face OV. > 50% was dedicated to discussion of above Dx, prognosis, treatment options, and side effects of medications. He does not want to try pharmacologic treatment for depression, wife would like Remeron Rx but explained that if he does not  want to take I can not make him take it.     Madison Albea G. Martinique, MD  [facility]. [place] office.

## 2016-12-31 ENCOUNTER — Ambulatory Visit (INDEPENDENT_AMBULATORY_CARE_PROVIDER_SITE_OTHER): Payer: Medicare Other | Admitting: Family Medicine

## 2016-12-31 ENCOUNTER — Encounter: Payer: Self-pay | Admitting: Family Medicine

## 2016-12-31 VITALS — BP 100/70 | HR 72 | Resp 12 | Ht 71.0 in | Wt 157.1 lb

## 2016-12-31 DIAGNOSIS — R109 Unspecified abdominal pain: Secondary | ICD-10-CM

## 2016-12-31 DIAGNOSIS — R140 Abdominal distension (gaseous): Secondary | ICD-10-CM | POA: Diagnosis not present

## 2016-12-31 DIAGNOSIS — Z471 Aftercare following joint replacement surgery: Secondary | ICD-10-CM | POA: Diagnosis not present

## 2016-12-31 DIAGNOSIS — Z96641 Presence of right artificial hip joint: Secondary | ICD-10-CM | POA: Diagnosis not present

## 2016-12-31 DIAGNOSIS — M1611 Unilateral primary osteoarthritis, right hip: Secondary | ICD-10-CM | POA: Diagnosis not present

## 2016-12-31 DIAGNOSIS — F322 Major depressive disorder, single episode, severe without psychotic features: Secondary | ICD-10-CM

## 2016-12-31 MED ORDER — DICYCLOMINE HCL 10 MG/5ML PO SOLN: 5.0000 mg | Freq: Three times a day (TID) | ORAL | 0 refills | Status: DC

## 2016-12-31 NOTE — Patient Instructions (Addendum)
A few things to remember from today's visit:   Depression, major, single episode, severe ([place])  Abdominal bloating with cramps  Try Bentyl. Let me know in 10-14 days how you are doing.  Please be sure medication list is accurate. If a new problem present, please set up appointment sooner than planned today.

## 2017-01-08 ENCOUNTER — Encounter: Payer: Self-pay | Admitting: Family Medicine

## 2017-02-10 NOTE — Progress Notes (Signed)
HPI:   [name] is a 76 y.o. male, who is here today with his wife to follow on recent OV.   He was seen on 12/31/16. I have seen him 2 times since 11/2016, his wife has been concerned about depressed mood but he has refused pharmacologic treatment. His wife wants me to see him monthly for now even though he has refused treatment. Psychotherapy appt 02/26/17. In general he feels like symptoms are stable, he denies suicidal thoughts.   Wife still reporting lack of motivation and depressed mood.  He has had some GI symptoms that may be aggravating anxiety and depression, bloating sensation and episodes of constipation. Miralax was helping with constipation but still having abdominal cramps,attributed to "gas." I recommend Bentyl to see if this would help with bloating sensation and rectal tenesmus but he was concerned about possible worsening of constipation.  Constipation has greatly improved, he is drinking prune juice and prunes. He is also reporting improvement in regard to abdominal cramps.  He is following with GI, Dr Jesse [name]. He supposed to follow up as needed.  Appetite has improved, he still eating smaller portions that usual and his wife is giving him Ensure once daily. Wt reported as stable.  Right before leaving, his wife comments on him having an appointment with podiatrist to discuss right foot problems.   She mentions that since 2002, when he has right foot surgery, he has had intermittent pain and "jerking" of RLE. This happens while he is asleep. AAccording to wife, their 3 children have the same problem with leg movement.  It has been stable. He denies associated headache, urine or bowel movement incontinence, confusion,or any seizure-like activity.   Review of Systems  Constitutional: Positive for fatigue. Negative for activity change, fever and unexpected weight change.  HENT: Negative for sore throat and trouble swallowing.   Respiratory: Negative  for chest tightness, shortness of breath and wheezing.   Cardiovascular: Negative for palpitations and leg swelling.  Gastrointestinal: Positive for constipation (improved.). Negative for nausea and vomiting.  Endocrine: Negative for cold intolerance and heat intolerance.  Musculoskeletal: Positive for arthralgias. Negative for myalgias.  Skin: Negative for pallor and rash.  Neurological: Negative for syncope, facial asymmetry, speech difficulty, weakness and headaches.  Psychiatric/Behavioral: Negative for confusion, hallucinations, sleep disturbance and suicidal ideas. The patient is nervous/anxious.       Current Outpatient Prescriptions on File Prior to Visit  Medication Sig Dispense Refill  . dicyclomine (BENTYL) 10 MG/5ML syrup Take 2.5 mLs (5 mg total) by mouth 3 (three) times daily before meals. 240 mL 0  . Glucosamine HCl-MSM (GLUCOSAMINE-MSM PO) Take 1 tablet by mouth daily.    . Glucosamine-Chondroit-Vit C-Mn (GLUCOSAMINE-CHONDROITIN) TABS Take 1 tablet by mouth daily.    Marland Kitchen levothyroxine (SYNTHROID, LEVOTHROID) 50 MCG tablet Take 50 mcg by mouth daily before breakfast.     . Multiple Vitamins-Minerals (MULTIVITAMIN ADULT PO) Take 1 tablet by mouth daily. One a Day    . polyethylene glycol (MIRALAX / GLYCOLAX) packet Take 17 g by mouth daily as needed.     No current facility-administered medications on file prior to visit.      Past Medical History:  Diagnosis Date  . Arthritis   . Atrial fibrillation ([place])   . Complication of anesthesia    Atrial fibrillation during surgery   . Hard of hearing   . Hx of constipation   . Osteoporosis    DEXA 11/09/10. Completed treatemtn with Fosamax.  Marland Kitchen  Prostate cancer ([place])   . Restless legs   . Thyroid disease    hypothyroidism  . Vitiligo    No Known Allergies  Social History   Social History  . Marital status: Married    Spouse name: N/A  . Number of children: N/A  . Years of education: N/A   Social History Main  Topics  . Smoking status: Former Smoker    Types: Cigarettes    Quit date: 05/28/1974  . Smokeless tobacco: Never Used  . Alcohol use Yes     Comment: 4-5 standard drinks per week  . Drug use: No  . Sexual activity: Yes   Other Topics Concern  . None   Social History Narrative  . None    Vitals:   02/11/17 1017  BP: 124/60  Pulse: 77  Resp: 12  SpO2: 96%   Body mass index is 21.81 kg/m.   Wt Readings from Last 3 Encounters:  02/11/17 156 lb 6 oz (70.9 kg)  12/31/16 157 lb 2 oz (71.3 kg)  12/14/16 157 lb 4 oz (71.3 kg)    Physical Exam  Nursing note and vitals reviewed. Constitutional: He is oriented to person, place, and time. He appears well-developed and well-nourished. No distress.  HENT:  Head: Normocephalic and atraumatic.  Mouth/Throat: Oropharynx is clear and moist and mucous membranes are normal.  Eyes: Conjunctivae are normal.  Cardiovascular: Normal rate and regular rhythm.   No murmur heard. Respiratory: Effort normal and breath sounds normal. No respiratory distress.  Musculoskeletal: He exhibits no edema or tenderness.  Lymphadenopathy:    He has no cervical adenopathy.  Neurological: He is alert and oriented to person, place, and time.  No focal deficit appreciated. Stable gait with no assistance.  Skin: Skin is warm. No rash noted. No erythema.  Psychiatric: He has a normal mood and affect. Cognition and memory are normal.  Well groomed, good eye contact.    ASSESSMENT AND PLAN:   Mr. Jesse [name] was seen today for follow-up.  Diagnoses and all orders for this visit:  Constipation, unspecified constipation type  Problem has improved. Continue adequate fiber and fluid intake as well as prunes. Follow-up with GI as needed.  Nocturnal leg movements  We discussed possible etiologies, including RLS as well as more serious neurologic disorders. He and his wife are not concerned about this, they report this as chronic and unchanged since 2002. He  has appointment with podiatrist to evaluate right foot pain. His wife would like to discuss this problem with podiatrists because she thinks it is related to surgery. Instructed about warning signs.  She is not interested in trying medication for RLS.  Depression, major, single episode, severe ([facility])  Stable. He is not interested in pharmacologic treatment. He will keep appointment with psychologist on 02/26/2017. Instructed about warning signs.   25 min face to face OV. > 50% was dedicated to discussion of Dx, prognosis, treatment options, and some side effects of medications. He is not interested in any pharmacologic treatment. His wife monitors closely and she will let me know if any new symptom or changes in MS. He is planning on getting influenza vaccine 02/2017. I will se him back in 4 months, before if needed.      Velmer Broadfoot G. Martinique, MD  [facility]. [place] office.

## 2017-02-11 ENCOUNTER — Encounter: Payer: Self-pay | Admitting: Family Medicine

## 2017-02-11 ENCOUNTER — Ambulatory Visit (INDEPENDENT_AMBULATORY_CARE_PROVIDER_SITE_OTHER): Payer: Medicare Other | Admitting: Family Medicine

## 2017-02-11 VITALS — BP 124/60 | HR 77 | Resp 12 | Ht 71.0 in | Wt 156.4 lb

## 2017-02-11 DIAGNOSIS — R258 Other abnormal involuntary movements: Secondary | ICD-10-CM | POA: Diagnosis not present

## 2017-02-11 DIAGNOSIS — F322 Major depressive disorder, single episode, severe without psychotic features: Secondary | ICD-10-CM | POA: Diagnosis not present

## 2017-02-11 DIAGNOSIS — K5900 Constipation, unspecified: Secondary | ICD-10-CM | POA: Diagnosis not present

## 2017-02-11 NOTE — Patient Instructions (Addendum)
A few things to remember from today's visit:   Depression, major, single episode, severe (HCC)  Nocturnal leg movements  Restless Legs Syndrome Restless legs syndrome is a condition that causes uncomfortable feelings or sensations in the legs, especially while sitting or lying down. The sensations usually cause an overwhelming urge to move the legs. The arms can also sometimes be affected. The condition can range from mild to severe. The symptoms often interfere with a person's ability to sleep. What are the causes? The cause of this condition is not known. What increases the risk? This condition is more likely to develop in:  People who are older than age 31.  Pregnant women. In general, restless legs syndrome is more common in women than in men.  People who have a family history of the condition.  People who have certain medical conditions, such as iron deficiency, kidney disease, Parkinson disease, or nerve damage.  People who take certain medicines, such as medicines for high blood pressure, nausea, colds, allergies, depression, and some heart conditions.  What are the signs or symptoms? The main symptom of this condition is uncomfortable sensations in the legs. These sensations may be:  Described as pulling, tingling, prickling, throbbing, crawling, or burning.  Worse while you are sitting or lying down.  Worse during periods of rest or inactivity.  Worse at night, often interfering with your sleep.  Accompanied by a very strong urge to move your legs.  Temporarily relieved by movement of your legs.  The sensations usually affect both sides of the body. The arms can also be affected, but this is rare. People who have this condition often have tiredness during the day because of their lack of sleep at night. How is this diagnosed? This condition may be diagnosed based on your description of the symptoms. You may also have tests, including blood tests, to check for other  conditions that may lead to your symptoms. In some cases, you may be asked to spend some time in a sleep lab so your sleeping can be monitored. How is this treated? Treatment for this condition is focused on managing the symptoms. Treatment may include:  Self-help and lifestyle changes.  Medicines.  Follow these instructions at home:  Take medicines only as directed by your health care provider.  Try these methods to get temporary relief from the uncomfortable sensations: ? Massage your legs. ? Walk or stretch. ? Take a cold or hot bath.  Practice good sleep habits. For example, go to bed and get up at the same time every day.  Exercise regularly.  Practice ways of relaxing, such as yoga or meditation.  Avoid caffeine and alcohol.  Do not use any tobacco products, including cigarettes, chewing tobacco, or electronic cigarettes. If you need help quitting, ask your health care provider.  Keep all follow-up visits as directed by your health care provider. This is important. Contact a health care provider if: Your symptoms do not improve with treatment, or they get worse. This information is not intended to replace advice given to you by your health care provider. Make sure you discuss any questions you have with your health care provider. Document Released: 05/04/2002 Document Revised: 10/20/2015 Document Reviewed: 05/10/2014 Elsevier Interactive Patient Education  [employer].  Please be sure medication list is accurate. If a new problem present, please set up appointment sooner than planned today.

## 2017-02-14 ENCOUNTER — Encounter: Payer: Self-pay | Admitting: Family Medicine

## 2017-02-16 ENCOUNTER — Encounter: Payer: Self-pay | Admitting: Family Medicine

## 2017-02-18 ENCOUNTER — Ambulatory Visit: Payer: Medicare Other | Admitting: Podiatry

## 2017-02-19 ENCOUNTER — Encounter: Payer: Self-pay | Admitting: Podiatry

## 2017-02-19 ENCOUNTER — Ambulatory Visit (INDEPENDENT_AMBULATORY_CARE_PROVIDER_SITE_OTHER): Payer: Self-pay | Admitting: Podiatry

## 2017-02-19 VITALS — BP 135/84 | HR 66

## 2017-02-19 DIAGNOSIS — M79676 Pain in unspecified toe(s): Secondary | ICD-10-CM

## 2017-02-19 DIAGNOSIS — B351 Tinea unguium: Secondary | ICD-10-CM

## 2017-02-19 NOTE — Progress Notes (Signed)
Subjective:   Jesse [name] is a 76 y.o. male who presents for Medicare Annual/Subsequent preventive examination.  The Patient was informed that the wellness visit is to identify future health risk and educate and initiate measures that can reduce risk for increased disease through the lifespan.    Annual Wellness Assessment  Reports health as fair   Preventive Screening -Counseling & Management  Medicare Annual Preventive Care Visit - Subsequent Last OV 01/2016 Note -Depressed mood; Psychotherapy 02/26/2017 [name] one week; and now feeling better;   Wife reports: Feb went for his 2nd hip replacement  Dr. Lyla Glassing States he was taking to many pain pills post op into early April. Wife stopped this and started Increasing water  Feels like the after effects of this potentiated depression Planning a trip in March 2019 to [place]; states Jesse [name] really enjoys this  Mother 95 lives with them - has dementia as well   Used to drink a few drinks, especially in flight And no drinks now; stopped drinking suddenly  Wife states "He is opposite of what he was"    Colonoscopy 02/2010  IMM Pneumonia series clarified PSV 23 03/16/2015 Prevnar 02/25/2014 (not sure of date but sure he took the prevnar about a year prior to the pneumovax     VS reviewed;   Diet  Lost 15 lbs with depression (was 170lb) and feels good now    BMI 22   Exercise going daily to [employer] Screening Comments: Can hear well  Bilateral hearing aid Doesn't use aid  Vision Screening Comments: Eye doctor this Friday for dilated eye exam   Treadmill, abd; states he likes this Wants to join dance class   Sleeps 9:30 to morning    Stressors:  stress of moving and surgery  Bought home in [place] January and moved in August when their home sold   Sleep patterns: sleeps well  Pain - no     Cardiac Risk Factors Addressed No    Advanced Directives - no but wife took copy of Jesse [name] form and  will complete.   Patient Care Team: Martinique, Betty G, MD as PCP - General (Family Medicine)  Dr. Diona Fanti  [facility]  Dr. Lyla Glassing in Ortho      Cardiac Risk Factors include: advanced age (>74men, >42 women)     Objective:    Vitals: BP 110/70   Pulse 66   Ht 5\' 10"  (1.778 m)   Wt 157 lb (71.2 kg)   SpO2 98%   BMI 22.53 kg/m   Body mass index is 22.53 kg/m.  Tobacco History  Smoking Status  . Former Smoker  . Types: Cigarettes  . Quit date: 05/28/1974  Smokeless Tobacco  . Never Used    Comment: smoked very little      Counseling given: Yes   Past Medical History:  Diagnosis Date  . Arthritis   . Atrial fibrillation ([place])   . Complication of anesthesia    Atrial fibrillation during surgery   . Hard of hearing   . Hx of constipation   . Osteoporosis    DEXA 11/09/10. Completed treatemtn with Fosamax.  . Prostate cancer ([place])   . Restless legs   . Thyroid disease    hypothyroidism  . Vitiligo    Past Surgical History:  Procedure Laterality Date  . BACK SURGERY    . COLONOSCOPY    . JOINT REPLACEMENT    . LAMINECTOMY  10/2011   cervical and  lumbar laminectomy  . PROSTATE BIOPSY  07/10/2011  . PROSTATE BIOPSY  09/22/15  . TOTAL HIP ARTHROPLASTY Right 02/29/2012  . TOTAL HIP ARTHROPLASTY Right 07/04/2016   Procedure: RIGHT TOTAL HIP ARTHROPLASTY ANTERIOR APPROACH;  Surgeon: Rod Can, MD;  Location: [facility];  Service: Orthopedics;  Laterality: Right;  . TRANSURETHRAL RESECTION OF PROSTATE     Family History  Problem Relation Age of Onset  . Atrial fibrillation Mother   . Atrial fibrillation Sister   . Cancer Neg Hx    History  Sexual Activity  . Sexual activity: Yes    Outpatient Encounter Prescriptions as of 02/20/2017  Medication Sig  . Glucosamine HCl-MSM (GLUCOSAMINE-MSM PO) Take 1 tablet by mouth daily.  . Glucosamine-Chondroit-Vit C-Mn (GLUCOSAMINE-CHONDROITIN) TABS Take 1 tablet by mouth daily.  Marland Kitchen levothyroxine (SYNTHROID, LEVOTHROID) 50 MCG  tablet Take 50 mcg by mouth daily before breakfast.   . Multiple Vitamins-Minerals (MULTIVITAMIN ADULT PO) Take 1 tablet by mouth daily. One a Day  . polyethylene glycol (MIRALAX / GLYCOLAX) packet Take 17 g by mouth daily as needed.   No facility-administered encounter medications on file as of 02/20/2017.     Activities of Daily Living In your present state of health, do you have any difficulty performing the following activities: 02/20/2017 06/27/2016  Hearing? N Y  Comment - mild hearing loss, doesn't wear hearing aids  Vision? Y N  Comment made apt; states he can't see well  wears glasses  Difficulty concentrating or making decisions? Y N  Walking or climbing stairs? N Y  Comment - due to hip pain  Dressing or bathing? N N  Doing errands, shopping? N N  Preparing Food and eating ? N -  Using the Toilet? N -  In the past six months, have you accidently leaked urine? Y -  Comment by UR  -  Do you have problems with loss of bowel control? N -  Managing your Medications? N -  Managing your Finances? N -  Housekeeping or managing your Housekeeping? N -  Some recent data might be hidden    Patient Care Team: Martin[place], Betty G, MD as PCP - General (Family Medicine)   Assessment:     Exercise Activities and Dietary recommendations Current Exercise Habits: Structured exercise class, Time (Minutes): 60, Frequency (Times/Week): 5, Weekly Exercise (Minutes/Week): 300, Intensity: Moderate  Goals    . Exercise 150 minutes per week (moderate activity)          Exercise ; keep exercising !      Fall Risk Fall Risk  02/20/2017 05/29/2016 12/07/2015 12/07/2015  Falls in the past year? No No No No   Depression Screen PHQ 2/9 Scores 02/20/2017 12/14/2016 05/29/2016 12/07/2015  PHQ - 2 Score 0 6 0 0  PHQ- 9 Score - 20 - -   states he is better. Interest in doing things is increasing; travel recently to [name] and enjoyed the trip   Cognitive Function MMSE - [place] Exam 02/20/2017    Not completed: (No Data)    Wife dicussed changes in his thinking and memory. The patient did not comment but was very well educated; Optometrist., loved numbers  The patient stated he mood was better; Educated regarding memory and the patient Declined memory test. Wife was discussing his memory and felt this may have been making him uncomfortable. He was able to respond appropriately and specifically to questions.  Deferred exam as he is to follow up with Dr. Glennon Hamilton. 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 Glennon Hamilton evaluate and then will fup with neurology if recommended        Immunization History  Administered Date(s) Administered  . Influenza-Unspecified 04/10/2016  . Pneumococcal-Unspecified 03/16/2015  . Tdap 12/31/2006   Screening Tests Health Maintenance  Topic Date Due  . PNA vac Low Risk Adult (2 of 2 - PCV13) 03/15/2016  . INFLUENZA VACCINE  12/26/2016  . TETANUS/TDAP  12/15/2026      Plan:     PCP Notes   Health Maintenance Planned flu vaccine for he and family on 10/1 Correct pneumonia series and is complete Eye exam scheduled 09/28 Agreed to take AD home to review and complete   Abnormal Screens  Issues with memory and recent depression Wife noticing changes; the patient declined the MMSE. Is seeing Dr. Glennon Hamilton and the wife plans to fup with neurology at that time if recommended  The patient states his depression is better. Affect was appropriate; quiet; discussed memory in general.  Responded  appropriately to questions. See note above for more detail pre wife    Referrals  No; wears hearing aids but does not use them   Patient concerns; Glad to be feeling better; enjoyed his trip to [name]  Nurse Concerns; As noted   Next PCP apt 06/14/2017    I have personally reviewed and noted the following in the patient's chart:   . Medical and social history . Use of alcohol, tobacco or illicit drugs  . Current medications and supplements . Functional ability and status . Nutritional status . Physical activity . Advanced directives . List of other physicians . Hospitalizations, surgeries, and ER visits in previous 12 months . Vitals . Screenings to include cognitive, depression, and falls . Referrals and appointments  In addition, I have reviewed and discussed with patient certain preventive protocols, quality metrics, and best practice recommendations. A written personalized care plan for preventive services as well as general preventive health recommendations were provided to patient.     Wynetta Fines, RN  02/20/2017

## 2017-02-19 NOTE — Progress Notes (Signed)
Patient ID: Jesse [name], male   DOB: 1940/08/05, 76 y.o.   MRN: [id number]   Subjective: Patient presents for a scheduled visit stating that when he walks and stands his toenails are uncomfortable request toenail debridement. The services been repetitive approximately two-month intervals. Patient states he has a hip replacement and is physically not able to reach his toes  Objective: Orientated 3 DP and PT pulses 2/4 bilaterally Reflexes within normal limits bilaterally Sensation to 10 g monofilament wire intact 5/5 bilaterally Vibratory sensation reactive bilaterally Ankle reflex reactive bilaterally No open skin lesions bilaterally The toenails are incurvated, elongated, discolored and tender to direct palpation 6-10 Hammertoe third right and second left Manual motor testing dorsi flexion, plantar flexion 5/5 bilaterally  Assessment: Satisfactory neurovascular status Symptomatic mycotic toenails 6-10  Plan: Debridement of toenails 6-10 mechanically and electrically without any bleeding  Reappoint 3 months

## 2017-02-20 ENCOUNTER — Ambulatory Visit (INDEPENDENT_AMBULATORY_CARE_PROVIDER_SITE_OTHER): Payer: Medicare Other

## 2017-02-20 VITALS — BP 110/70 | HR 66 | Ht 70.0 in | Wt 157.0 lb

## 2017-02-20 DIAGNOSIS — Z0000 Encounter for general adult medical examination without abnormal findings: Secondary | ICD-10-CM | POA: Diagnosis not present

## 2017-02-20 NOTE — Progress Notes (Signed)
I have reviewed documentation from this visit and I agree with recommendations given.  Salah Nakamura G. Arlesia Kiel, MD  [facility]. [place] office.   

## 2017-02-20 NOTE — Patient Instructions (Addendum)
Jesse [name] , Thank you for taking time to come for your Medicare Wellness Visit. I appreciate your ongoing commitment to your health goals. Please review the following plan we discussed and let me know if I can assist you in the future.   Was a smoker and exposed to secondary smoke so would like to proceed with Abdominal Aneurysm check which sometimes occurs when you have a smoking history.   Plan to take flu vaccine 10/1 when he see Dr. Glennon Hamilton  Has psv 23 03/16/2015 Had prevnar prior to PSV; most likely 2015;   Prevention of falls: Remove rugs or any tripping hazards in the home Use Non slip mats in bathtubs and showers Placing grab bars next to the toilet and or shower Placing handrails on both sides of the stair way Adding extra lighting in the home.   Personal safety issues reviewed:  1. Consider starting a community watch program per [facility] 2.  Changes batteries is smoke detector and/or carbon monoxide detector  3.  If you have firearms; keep them in a safe place 4.  Wear protection when in the sun; Always wear sunscreen or a hat; It is good to have your doctor check your skin annually or review any new areas of concern 5. Driving safety; Keep in the right lane; stay 3 car lengths behind the car in front of you on the highway; look 3 times prior to pulling out; carry your cell phone everywhere you go!    Learn about the Yellow Dot program:  The program allows first responders at your emergency to have access to who your physician is, as well as your medications and medical conditions.  Citizens requesting the Yellow Dot Packages should contact Master Corporal Nunzio Cobbs at the [facility] [phone] for the first week of the program and beginning the week after Easter citizens should contact their Scientist, physiological.      These are the goals we discussed: Goals    . Exercise 150 minutes per week (moderate activity)          Exercise ; keep exercising !       This is a list of the screening recommended for you and due dates:  Health Maintenance  Topic Date Due  . Pneumonia vaccines (2 of 2 - PCV13) 03/15/2016  . Flu Shot  12/26/2016  . Tetanus Vaccine  12/15/2026        Fall Prevention in the Home Falls can cause injuries. They can happen to people of all ages. There are many things you can do to make your home safe and to help prevent falls. What can I do on the outside of my home?  Regularly fix the edges of walkways and driveways and fix any cracks.  Remove anything that might make you trip as you walk through a door, such as a raised step or threshold.  Trim any bushes or trees on the path to your home.  Use bright outdoor lighting.  Clear any walking paths of anything that might make someone trip, such as rocks or tools.  Regularly check to see if handrails are loose or broken. Make sure that both sides of any steps have handrails.  Any raised decks and porches should have guardrails on the edges.  Have any leaves, snow, or ice cleared regularly.  Use sand or salt on walking paths during winter.  Clean up any spills in your garage right away. This includes oil or grease  spills. What can I do in the bathroom?  Use night lights.  Install grab bars by the toilet and in the tub and shower. Do not use towel bars as grab bars.  Use non-skid mats or decals in the tub or shower.  If you need to sit down in the shower, use a plastic, non-slip stool.  Keep the floor dry. Clean up any water that spills on the floor as soon as it happens.  Remove soap buildup in the tub or shower regularly.  Attach bath mats securely with double-sided non-slip rug tape.  Do not have throw rugs and other things on the floor that can make you trip. What can I do in the bedroom?  Use night lights.  Make sure that you have a light by your bed that is easy to reach.  Do not use any sheets or blankets  that are too big for your bed. They should not hang down onto the floor.  Have a firm chair that has side arms. You can use this for support while you get dressed.  Do not have throw rugs and other things on the floor that can make you trip. What can I do in the kitchen?  Clean up any spills right away.  Avoid walking on wet floors.  Keep items that you use a lot in easy-to-reach places.  If you need to reach something above you, use a strong step stool that has a grab bar.  Keep electrical cords out of the way.  Do not use floor polish or wax that makes floors slippery. If you must use wax, use non-skid floor wax.  Do not have throw rugs and other things on the floor that can make you trip. What can I do with my stairs?  Do not leave any items on the stairs.  Make sure that there are handrails on both sides of the stairs and use them. Fix handrails that are broken or loose. Make sure that handrails are as long as the stairways.  Check any carpeting to make sure that it is firmly attached to the stairs. Fix any carpet that is loose or worn.  Avoid having throw rugs at the top or bottom of the stairs. If you do have throw rugs, attach them to the floor with carpet tape.  Make sure that you have a light switch at the top of the stairs and the bottom of the stairs. If you do not have them, ask someone to add them for you. What else can I do to help prevent falls?  Wear shoes that: ? Do not have high heels. ? Have rubber bottoms. ? Are comfortable and fit you well. ? Are closed at the toe. Do not wear sandals.  If you use a stepladder: ? Make sure that it is fully opened. Do not climb a closed stepladder. ? Make sure that both sides of the stepladder are locked into place. ? Ask someone to hold it for you, if possible.  Clearly mark and make sure that you can see: ? Any grab bars or handrails. ? First and last steps. ? Where the edge of each step is.  Use tools that help  you move around (mobility aids) if they are needed. These include: ? Canes. ? Walkers. ? Scooters. ? Crutches.  Turn on the lights when you go into a dark area. Replace any light bulbs as soon as they burn out.  Set up your furniture so you have a clear  path. Avoid moving your furniture around.  If any of your floors are uneven, fix them.  If there are any pets around you, be aware of where they are.  Review your medicines with your doctor. Some medicines can make you feel dizzy. This can increase your chance of falling. Ask your doctor what other things that you can do to help prevent falls. This information is not intended to replace advice given to you by your health care provider. Make sure you discuss any questions you have with your health care provider. Document Released: 03/10/2009 Document Revised: 10/20/2015 Document Reviewed: 06/18/2014 Elsevier Interactive Patient Education  2018 [place] Maintenance, Male A healthy lifestyle and preventive care is important for your health and wellness. Ask your health care provider about what schedule of regular examinations is right for you. What should I know about weight and diet? Eat a Healthy Diet  Eat plenty of vegetables, fruits, whole grains, low-fat dairy products, and lean protein.  Do not eat a lot of foods high in solid fats, added sugars, or salt.  Maintain a Healthy Weight Regular exercise can help you achieve or maintain a healthy weight. You should:  Do at least 150 minutes of exercise each week. The exercise should increase your heart rate and make you sweat (moderate-intensity exercise).  Do strength-training exercises at least twice a week.  Watch Your Levels of Cholesterol and Blood Lipids  Have your blood tested for lipids and cholesterol every 5 years starting at 77 years of age. If you are at high risk for heart disease, you should start having your blood tested when you are 77 years old. You  may need to have your cholesterol levels checked more often if: ? Your lipid or cholesterol levels are high. ? You are older than 77 years of age. ? You are at high risk for heart disease.  What should I know about cancer screening? Many types of cancers can be detected early and may often be prevented. Lung Cancer  You should be screened every year for lung cancer if: ? You are a current smoker who has smoked for at least 30 years. ? You are a former smoker who has quit within the past 15 years.  Talk to your health care provider about your screening options, when you should start screening, and how often you should be screened.  Colorectal Cancer  Routine colorectal cancer screening usually begins at 77 years of age and should be repeated every 5-10 years until you are 77 years old. You may need to be screened more often if early forms of precancerous polyps or small growths are found. Your health care provider may recommend screening at an earlier age if you have risk factors for colon cancer.  Your health care provider may recommend using home test kits to check for hidden blood in the stool.  A small camera at the end of a tube can be used to examine your colon (sigmoidoscopy or colonoscopy). This checks for the earliest forms of colorectal cancer.  Prostate and Testicular Cancer  Depending on your age and overall health, your health care provider may do certain tests to screen for prostate and testicular cancer.  Talk to your health care provider about any symptoms or concerns you have about testicular or prostate cancer.  Skin Cancer  Check your skin from head to toe regularly.  Tell your health care provider about any new moles or changes in moles, especially if: ? There  is a change in a mole's size, shape, or color. ? You have a mole that is larger than a pencil eraser.  Always use sunscreen. Apply sunscreen liberally and repeat throughout the day.  Protect yourself by  wearing long sleeves, pants, a wide-brimmed hat, and sunglasses when outside.  What should I know about heart disease, diabetes, and high blood pressure?  If you are 69-44 years of age, have your blood pressure checked every 3-5 years. If you are 4 years of age or older, have your blood pressure checked every year. You should have your blood pressure measured twice-once when you are at a hospital or clinic, and once when you are not at a hospital or clinic. Record the average of the two measurements. To check your blood pressure when you are not at a hospital or clinic, you can use: ? An automated blood pressure machine at a pharmacy. ? A home blood pressure monitor.  Talk to your health care provider about your target blood pressure.  If you are between 87-33 years old, ask your health care provider if you should take aspirin to prevent heart disease.  Have regular diabetes screenings by checking your fasting blood sugar level. ? If you are at a normal weight and have a low risk for diabetes, have this test once every three years after the age of 81. ? If you are overweight and have a high risk for diabetes, consider being tested at a younger age or more often.  A one-time screening for abdominal aortic aneurysm (AAA) by ultrasound is recommended for men aged 18-75 years who are current or former smokers. What should I know about preventing infection? Hepatitis B If you have a higher risk for hepatitis B, you should be screened for this virus. Talk with your health care provider to find out if you are at risk for hepatitis B infection. Hepatitis C Blood testing is recommended for:  Everyone born from [birth year] through 1966.  Anyone with known risk factors for hepatitis C.  Sexually Transmitted Diseases (STDs)  You should be screened each year for STDs including gonorrhea and chlamydia if: ? You are sexually active and are younger than 77 years of age. ? You are older than 77 years of age  and your health care provider tells you that you are at risk for this type of infection. ? Your sexual activity has changed since you were last screened and you are at an increased risk for chlamydia or gonorrhea. Ask your health care provider if you are at risk.  Talk with your health care provider about whether you are at high risk of being infected with HIV. Your health care provider may recommend a prescription medicine to help prevent HIV infection.  What else can I do?  Schedule regular health, dental, and eye exams.  Stay current with your vaccines (immunizations).  Do not use any tobacco products, such as cigarettes, chewing tobacco, and e-cigarettes. If you need help quitting, ask your health care provider.  Limit alcohol intake to no more than 2 drinks per day. One drink equals 12 ounces of beer, 5 ounces of wine, or 1 ounces of hard liquor.  Do not use street drugs.  Do not share needles.  Ask your health care provider for help if you need support or information about quitting drugs.  Tell your health care provider if you often feel depressed.  Tell your health care provider if you have ever been abused or do not feel safe  at home. This information is not intended to replace advice given to you by your health care provider. Make sure you discuss any questions you have with your health care provider. Document Released: 11/10/2007 Document Revised: 01/11/2016 Document Reviewed: 02/15/2015 Elsevier Interactive Patient Education  [employer].

## 2017-02-22 DIAGNOSIS — D3132 Benign neoplasm of left choroid: Secondary | ICD-10-CM | POA: Diagnosis not present

## 2017-02-22 DIAGNOSIS — H25813 Combined forms of age-related cataract, bilateral: Secondary | ICD-10-CM | POA: Diagnosis not present

## 2017-02-22 DIAGNOSIS — H01001 Unspecified blepharitis right upper eyelid: Secondary | ICD-10-CM | POA: Diagnosis not present

## 2017-02-22 DIAGNOSIS — H524 Presbyopia: Secondary | ICD-10-CM | POA: Diagnosis not present

## 2017-02-25 DIAGNOSIS — C61 Malignant neoplasm of prostate: Secondary | ICD-10-CM | POA: Diagnosis not present

## 2017-02-26 ENCOUNTER — Ambulatory Visit (INDEPENDENT_AMBULATORY_CARE_PROVIDER_SITE_OTHER): Payer: Medicare Other | Admitting: Psychology

## 2017-02-26 ENCOUNTER — Ambulatory Visit (INDEPENDENT_AMBULATORY_CARE_PROVIDER_SITE_OTHER): Payer: Medicare Other | Admitting: *Deleted

## 2017-02-26 DIAGNOSIS — F411 Generalized anxiety disorder: Secondary | ICD-10-CM | POA: Diagnosis not present

## 2017-02-26 DIAGNOSIS — F330 Major depressive disorder, recurrent, mild: Secondary | ICD-10-CM

## 2017-02-26 DIAGNOSIS — Z23 Encounter for immunization: Secondary | ICD-10-CM | POA: Diagnosis not present

## 2017-03-15 DIAGNOSIS — M19171 Post-traumatic osteoarthritis, right ankle and foot: Secondary | ICD-10-CM | POA: Diagnosis not present

## 2017-03-19 ENCOUNTER — Ambulatory Visit (INDEPENDENT_AMBULATORY_CARE_PROVIDER_SITE_OTHER): Payer: Medicare Other | Admitting: Psychology

## 2017-03-19 DIAGNOSIS — F330 Major depressive disorder, recurrent, mild: Secondary | ICD-10-CM | POA: Diagnosis not present

## 2017-03-19 DIAGNOSIS — F411 Generalized anxiety disorder: Secondary | ICD-10-CM | POA: Diagnosis not present

## 2017-03-25 ENCOUNTER — Ambulatory Visit (INDEPENDENT_AMBULATORY_CARE_PROVIDER_SITE_OTHER): Payer: Medicare Other | Admitting: Psychology

## 2017-03-25 DIAGNOSIS — F330 Major depressive disorder, recurrent, mild: Secondary | ICD-10-CM | POA: Diagnosis not present

## 2017-03-25 DIAGNOSIS — F419 Anxiety disorder, unspecified: Secondary | ICD-10-CM

## 2017-03-25 DIAGNOSIS — F411 Generalized anxiety disorder: Secondary | ICD-10-CM | POA: Diagnosis not present

## 2017-04-07 ENCOUNTER — Encounter: Payer: Self-pay | Admitting: Family Medicine

## 2017-04-12 ENCOUNTER — Other Ambulatory Visit: Payer: Self-pay | Admitting: Family Medicine

## 2017-04-12 DIAGNOSIS — E039 Hypothyroidism, unspecified: Secondary | ICD-10-CM

## 2017-04-12 DIAGNOSIS — I4891 Unspecified atrial fibrillation: Secondary | ICD-10-CM

## 2017-04-12 DIAGNOSIS — C61 Malignant neoplasm of prostate: Secondary | ICD-10-CM

## 2017-04-12 MED ORDER — LEVOTHYROXINE SODIUM 50 MCG PO TABS: 50.0000 ug | ORAL_TABLET | Freq: Every day | ORAL | 3 refills | Status: DC

## 2017-04-12 NOTE — Telephone Encounter (Signed)
Patients wife calling to check on the rx for Synthroid being sent to [facility].

## 2017-04-23 ENCOUNTER — Ambulatory Visit: Payer: Medicare Other | Admitting: Podiatry

## 2017-04-24 ENCOUNTER — Ambulatory Visit (INDEPENDENT_AMBULATORY_CARE_PROVIDER_SITE_OTHER): Payer: Medicare Other | Admitting: Podiatry

## 2017-04-24 ENCOUNTER — Encounter: Payer: Self-pay | Admitting: Podiatry

## 2017-04-24 DIAGNOSIS — M79676 Pain in unspecified toe(s): Secondary | ICD-10-CM

## 2017-04-24 DIAGNOSIS — B351 Tinea unguium: Secondary | ICD-10-CM

## 2017-04-24 DIAGNOSIS — L603 Nail dystrophy: Secondary | ICD-10-CM

## 2017-04-24 NOTE — Progress Notes (Signed)
This patient presents the office for an evaluation and treatment of his long painful nails  . Patient states that the nails are painful walking and wearing his shoes.  Patient is presented to the office with his wife.  Patient states he has a history of a hip replacement... He presents the office today for preventative foot care services   General Appearance  Alert, conversant and in no acute stress.  Vascular  Dorsalis pedis and posterior pulses are palpable  bilaterally.  Capillary return is within normal limits  Bilaterally. Temperature is within normal limits  Bilaterally  Neurologic  Senn-Weinstein monofilament wire test within normal limits  bilaterally. Muscle power  Within normal limits bilaterally.  Nails Thick disfigured discolored nails with subungual debride bilaterally from hallux to fifth toes bilaterally. No evidence of bacterial infection or drainage bilaterally.  Orthopedic  No limitations of motion of motion feet bilaterally.  No crepitus or effusions noted.  No bony pathology or digital deformities noted.  Skin  normotropic skin with no porokeratosis noted bilaterally.  No signs of infections or ulcers noted.    Diagnosis  Onychomycosis  B/L   Debridement and grinding of long painful nails.  RTC 3 months   Gardiner Barefoot DPM

## 2017-04-26 ENCOUNTER — Encounter: Payer: Self-pay | Admitting: Family Medicine

## 2017-04-26 ENCOUNTER — Ambulatory Visit: Payer: Self-pay

## 2017-04-26 ENCOUNTER — Other Ambulatory Visit: Payer: Self-pay | Admitting: Family Medicine

## 2017-04-26 MED ORDER — MIRTAZAPINE 7.5 MG PO TABS: 7.5000 mg | ORAL_TABLET | Freq: Every day | ORAL | 1 refills | Status: DC

## 2017-04-26 NOTE — Telephone Encounter (Signed)
  Reason for Disposition . Requesting to talk with a counselor (mental health worker, psychiatrist, etc.)  Answer Assessment - Initial Assessment Questions 1. CONCERN: "What happened that made you call today?"     Wife called ; feels his depression is getting worse 2. DEPRESSION SYMPTOM SCREENING: "How are you feeling overall?" (e.g., decreased energy, increased sleeping or difficulty sleeping, difficulty concentrating, feelings of sadness, guilt, hopelessness, or worthlessness)     Decreased energy,isolating 3. RISK OF HARM - SUICIDAL IDEATION:  "Do you ever have thoughts of hurting or killing yourself?"  (e.g., yes, no, no but preoccupation with thoughts about death)   - INTENT:  "Do you have thoughts of hurting or killing yourself right NOW?" (e.g., yes, no, N/A)   - PLAN: "Do you have a specific plan for how you would do this?" (e.g., gun, knife, overdose, no plan, N/A)     No 4. RISK OF HARM - HOMICIDAL IDEATION:  "Do you ever have thoughts of hurting or killing someone else?"  (e.g., yes, no, no but preoccupation with thoughts about death)   - INTENT:  "Do you have thoughts of hurting or killing someone right NOW?" (e.g., yes, no, N/A)   - PLAN: "Do you have a specific plan for how you would do this?" (e.g., gun, knife, no plan, N/A)      No 5. FUNCTIONAL IMPAIRMENT: "How have things been going for you overall in your life? Have you had any more difficulties than usual doing your normal daily activities?"  (e.g., better, same, worse; self-care, school, work, interactions)     No 6. SUPPORT: "Who is with you now?" "Who do you live with?" "Do you have family or friends nearby who you can talk to?"      His wife 7. THERAPIST: "Do you have a counselor or therapist? Name?"     No 8. STRESSORS: "Has there been any new stress or recent changes in your life?"     No 9. DRUG ABUSE/ALCOHOL: "Do you drink alcohol or use any illegal drugs?"      No 10. OTHER: "Do you have any other health or  medical symptoms right now?" (e.g., fever)       No 11. PREGNANCY: "Is there any chance you are pregnant?" "When was your last menstrual period?"       No  Protocols used: DEPRESSION-A-AH Pt. Has an appointment for 04/29/17. Instructed pt.'s wife if he talks of hurting himself or anyone else to call 911. Verbalizes understanding. States she will reach out to his PCP Dr. Martinique as well via [place]. States he has refused antidepressats in the past and did not cooperate with counseling.

## 2017-04-29 ENCOUNTER — Ambulatory Visit (INDEPENDENT_AMBULATORY_CARE_PROVIDER_SITE_OTHER): Payer: Medicare Other | Admitting: Internal Medicine

## 2017-04-29 ENCOUNTER — Encounter: Payer: Self-pay | Admitting: Internal Medicine

## 2017-04-29 VITALS — BP 102/62 | HR 60 | Temp 97.9°F | Ht 70.0 in | Wt 162.6 lb

## 2017-04-29 DIAGNOSIS — E039 Hypothyroidism, unspecified: Secondary | ICD-10-CM

## 2017-04-29 DIAGNOSIS — F322 Major depressive disorder, single episode, severe without psychotic features: Secondary | ICD-10-CM

## 2017-04-29 DIAGNOSIS — C61 Malignant neoplasm of prostate: Secondary | ICD-10-CM

## 2017-04-29 MED ORDER — ESCITALOPRAM OXALATE 5 MG PO TABS: 5.0000 mg | ORAL_TABLET | Freq: Every day | ORAL | 2 refills | Status: DC

## 2017-04-29 NOTE — Progress Notes (Signed)
Subjective:    Patient ID: Jesse [name], male    DOB: January 12, 1941, 76 y.o.   MRN: [id number]  HPI  76 year old patient who has a prior history of major depression.  He is accompanied by his wife today.  Apparently at age 76 he was hospitalized for severe depression and was an inpatient for approximately 1 month under the psychiatric service. He states that his depression reoccurred in July.  This has been associated with weight loss poor appetite and little interest in daily activities.  His wife is also concerned about decreased cognition difficulties with his memory and becoming much more forgetful.    He has been very reluctant to consider pharmacotherapy but has been followed by behavioral health.  He has seen Dr. Ebony Hail on  occasions who has referred him to Dr Marcos Eke.  The patient has had a number of medical issues including recent treatment for prostate cancer.  He has had multiple surgeries since 2013.  Patient retired in 2009 and moved from the [place] area to [place] in August of last year.  Past Medical History:  Diagnosis Date  . Arthritis   . Atrial fibrillation ([place])   . Complication of anesthesia    Atrial fibrillation during surgery   . Hard of hearing   . Hx of constipation   . Osteoporosis    DEXA 11/09/10. Completed treatemtn with Fosamax.  . Prostate cancer ([place])   . Restless legs   . Thyroid disease    hypothyroidism  . Vitiligo      Social History   Socioeconomic History  . Marital status: Married    Spouse name: Not on file  . Number of children: Not on file  . Years of education: Not on file  . Highest education level: Not on file  Social Needs  . Financial resource strain: Not on file  . Food insecurity - worry: Not on file  . Food insecurity - inability: Not on file  . Transportation needs - medical: Not on file  . Transportation needs - non-medical: Not on file  Occupational History  . Not on file  Tobacco Use  . Smoking status:  Former Smoker    Types: Cigarettes    Last attempt to quit: 05/28/1974    Years since quitting: 42.9  . Smokeless tobacco: Never Used  . Tobacco comment: smoked very little   Substance and Sexual Activity  . Alcohol use: Yes    Comment: 4-5 standard drinks per week/ stopped drinkin   . Drug use: No  . Sexual activity: Yes  Other Topics Concern  . Not on file  Social History Narrative  . Not on file    Past Surgical History:  Procedure Laterality Date  . BACK SURGERY    . COLONOSCOPY    . JOINT REPLACEMENT    . LAMINECTOMY  10/2011   cervical and lumbar laminectomy  . PROSTATE BIOPSY  07/10/2011  . PROSTATE BIOPSY  09/22/15  . TOTAL HIP ARTHROPLASTY Right 02/29/2012  . TOTAL HIP ARTHROPLASTY Right 07/04/2016   Procedure: RIGHT TOTAL HIP ARTHROPLASTY ANTERIOR APPROACH;  Surgeon: Rod Can, MD;  Location: [facility];  Service: Orthopedics;  Laterality: Right;  . TRANSURETHRAL RESECTION OF PROSTATE      Family History  Problem Relation Age of Onset  . Atrial fibrillation Mother   . Atrial fibrillation Sister   . Cancer Neg Hx     No Known Allergies  Current Outpatient Medications on File Prior to Visit  Medication Sig Dispense Refill  . Glucosamine HCl-MSM (GLUCOSAMINE-MSM PO) Take 1 tablet by mouth daily.    . Glucosamine-Chondroit-Vit C-Mn (GLUCOSAMINE-CHONDROITIN) TABS Take 1 tablet by mouth daily.    Marland Kitchen levothyroxine (SYNTHROID, LEVOTHROID) 50 MCG tablet Take 1 tablet (50 mcg total) daily before breakfast by mouth. 90 tablet 3  . Multiple Vitamins-Minerals (MULTIVITAMIN ADULT PO) Take 1 tablet by mouth daily. One a Day    . polyethylene glycol (MIRALAX / GLYCOLAX) packet Take 17 g by mouth daily as needed.     No current facility-administered medications on file prior to visit.     BP 102/62 (BP Location: Left Arm, Patient Position: Sitting, Cuff Size: Normal)   Pulse 60   Temp 97.9 F (36.6 C) (Oral)   Ht 5\' 10"  (1.778 m)   Wt 162 lb 9.6 oz (73.8 kg)   SpO2 97%    BMI 23.33 kg/m     Review of Systems  Constitutional: Positive for activity change, appetite change, fatigue and unexpected weight change. Negative for chills and fever.  HENT: Negative for congestion, dental problem, ear pain, hearing loss, sore throat, tinnitus, trouble swallowing and voice change.   Eyes: Negative for pain, discharge and visual disturbance.  Respiratory: Negative for cough, chest tightness, wheezing and stridor.   Cardiovascular: Negative for chest pain, palpitations and leg swelling.  Gastrointestinal: Negative for abdominal distention, abdominal pain, blood in stool, constipation, diarrhea, nausea and vomiting.  Genitourinary: Negative for difficulty urinating, discharge, flank pain, genital sores, hematuria and urgency.  Musculoskeletal: Negative for arthralgias, back pain, gait problem, joint swelling, myalgias and neck stiffness.  Skin: Negative for rash.  Neurological: Negative for dizziness, syncope, speech difficulty, weakness, numbness and headaches.  Hematological: Negative for adenopathy. Does not bruise/bleed easily.  Psychiatric/Behavioral: Positive for behavioral problems, confusion, decreased concentration, dysphoric mood and sleep disturbance. The patient is not nervous/anxious.        Objective:   Physical Exam  Constitutional: He is oriented to person, place, and time. He appears well-developed.  HENT:  Head: Normocephalic.  Right Ear: External ear normal.  Left Ear: External ear normal.  Eyes: Conjunctivae and EOM are normal.  Neck: Normal range of motion.  Cardiovascular: Normal rate and normal heart sounds.  Pulmonary/Chest: Breath sounds normal.  Abdominal: Bowel sounds are normal.  Musculoskeletal: Normal range of motion. He exhibits no edema or tenderness.  Neurological: He is alert and oriented to person, place, and time.  Psychiatric: He has a normal mood and affect. His behavior is normal.  Slight flat affect MMSE 29/30.  Patient  able to recall only 2 of 3 objects          Assessment & Plan:   Recurrence of major depression.  Patient is agreeable to therapy.  Will start with Lexapro 5 and consider titration in 4 weeks at the time of reevaluation.  Depending on clinical response will consider psychiatric referral at that time.  MMSE 29/30  History of hypothyroidism History of major depression age 32   Follow-up PCP 4 weeks  Euline Kimbler Pilar Plate

## 2017-04-29 NOTE — Patient Instructions (Addendum)
Return in 4 weeks for follow-up   Major Depressive Disorder, Adult Major depressive disorder (MDD) is a mental health condition. MDD often makes you feel sad, hopeless, or helpless. MDD can also cause symptoms in your body. MDD can affect your:  Work.  School.  Relationships.  Other normal activities.  MDD can range from mild to very bad. It may occur once (single episode MDD). It can also occur many times (recurrent MDD). The main symptoms of MDD often include:  Feeling sad, depressed, or irritable most of the time.  Loss of interest.  MDD symptoms also include:  Sleeping too much or too little.  Eating too much or too little.  A change in your weight.  Feeling tired (fatigue) or having low energy.  Feeling worthless.  Feeling guilty.  Trouble making decisions.  Trouble thinking clearly.  Thoughts of suicide or harming others.  Feeling weak.  Feeling agitated.  Keeping yourself from being around other people (isolation).  Follow these instructions at home: Activity  Do these things as told by your doctor: ? Go back to your normal activities. ? Exercise regularly. ? Spend time outdoors. Alcohol  Talk with your doctor about how alcohol can affect your antidepressant medicines.  Do not drink alcohol. Or, limit how much alcohol you drink. ? This means no more than 1 drink a day for nonpregnant women and 2 drinks a day for men. One drink equals one of these:  12 oz of beer.  5 oz of wine.  1 oz of hard liquor. General instructions  Take over-the-counter and prescription medicines only as told by your doctor.  Eat a healthy diet.  Get plenty of sleep.  Find activities that you enjoy. Make time to do them.  Think about joining a support group. Your doctor may be able to suggest a group for you.  Keep all follow-up visits as told by your doctor. This is important. Where to find more information:  [employer] on Mental  Illness: ? www.nami.[place]: ? https://carter.com/  National Suicide Prevention Lifeline: ? (316)469-1877. This is free, 24-hour help. Contact a doctor if:  Your symptoms get worse.  You have new symptoms. Get help right away if:  You self-harm.  You see, hear, taste, smell, or feel things that are not present (hallucinate). If you ever feel like you may hurt yourself or others, or have thoughts about taking your own life, get help right away. You can go to your nearest emergency department or call:  Your local emergency services (911 in the [place]  A suicide crisis helpline, such as the National Suicide Prevention Lifeline: ? 416-463-7032. This is open 24 hours a day.  This information is not intended to replace advice given to you by your health care provider. Make sure you discuss any questions you have with your health care provider. Document Released: 04/25/2015 Document Revised: 01/29/2016 Document Reviewed: 01/29/2016 Elsevier Interactive Patient Education  2017 [employer].

## 2017-04-30 ENCOUNTER — Encounter: Payer: Self-pay | Admitting: Nurse Practitioner

## 2017-04-30 ENCOUNTER — Ambulatory Visit (INDEPENDENT_AMBULATORY_CARE_PROVIDER_SITE_OTHER): Payer: Medicare Other | Admitting: Nurse Practitioner

## 2017-04-30 ENCOUNTER — Ambulatory Visit: Payer: Self-pay | Admitting: Internal Medicine

## 2017-04-30 VITALS — BP 118/72 | HR 70 | Ht 70.0 in | Wt 167.4 lb

## 2017-04-30 DIAGNOSIS — R1030 Lower abdominal pain, unspecified: Secondary | ICD-10-CM | POA: Diagnosis not present

## 2017-04-30 DIAGNOSIS — R109 Unspecified abdominal pain: Secondary | ICD-10-CM

## 2017-04-30 NOTE — Progress Notes (Signed)
Chief Complaint:  Intermittent abdominal pain.   HPI: Patient is a 76 year old male who I saw several months ago with bowel changes and excessive gas.  Essentially he was constipated, given a trial of MiraLAX, Gas-X and low gas diet.Merril Abbe helped but  Wife read package insert that it should not be taken on a regular basis.  He tried only one tablet of  Gas-x. Marland Kitchen  When I saw him in follow-up I asked him to resume the MiraLAX at least every other day as well as increase his water intake.  He was given a follow-up appointment to see Dr. Havery Moros who he saw in June. At that time he was having fairly normal bowel movement despite infrequent use of MiraLAX. Patient was advised to continue to use MiraLAX as needed.  Patient is here with his wife.  As usual, the wife who is a retired Marine scientist is very involved in patient's care.  She keeps records of his bowel movements.  She monitors his food intake and has recently gotten him to exercise.. BMs are still basically normal without miralax. He is eating a balanced diet. Depsitae normalization of bowels he is having intermittent lower abdominal pain. He often has a lot of intestinal gurgling in the mornings. Sometimes feels need to defeate but doesn't. Wife feels that he holds gas in which leads to discomfort.   He feels anxious and that leads to transient lower abdominal discomfort. Gets anxious about things needing to be done around the house. Hx of prostate cancer, wife feels patient worries about recurrent or other types of cancer.  Patient doesn't agree with that but he does admit to feeling depressed and is hopeful that the Lexapro started yesterday will make him feel better.  Wife asks about colonoscoy. He has no blood in stool, lost weight but gaining it back.   Past Medical History:  Diagnosis Date  . Arthritis   . Atrial fibrillation ([place])   . Complication of anesthesia    Atrial fibrillation during surgery   . Hard of hearing   . Hx of  constipation   . Osteoporosis    DEXA 11/09/10. Completed treatemtn with Fosamax.  . Prostate cancer ([place])   . Restless legs   . Thyroid disease    hypothyroidism  . Vitiligo     Patient's surgical history, family medical history, social history, medications and allergies were all reviewed in Epic    Physical Exam: BP 118/72   Pulse 70   Ht 5\' 10"  (1.778 m)   Wt 167 lb 6.4 oz (75.9 kg)   SpO2 96%   BMI 24.02 kg/m    GENERAL:  Well developed white male in NAD PSYCH: :Pleasant, cooperative, normal affect EENT:  conjunctiva pink, mucous membranes moist, neck supple without masses CARDIAC:  RRR, no murmur heard, no peripheral edema PULM: Normal respiratory effort, lungs CTA bilaterally, no wheezing ABDOMEN:  Nondistended, soft, nontender. No obvious masses, no hepatomegaly,  normal bowel sounds SKIN:  turgor, no lesions seen Musculoskeletal:  Normal muscle tone, normal strength NEURO: Alert and oriented x 3, no focal neurologic deficits    ASSESSMENT and PLAN:  1. Pleasant 76 year old male with recent constipation. Bowels moving normally now, even without miralax. He is eating well, exercising, drinking fluids.  -no intervention needed  2. Intermittent lower abdominal pain. Patient correlates pain with anxiety. His bowels are moving well. No blood in stool. Weight stable. No dysuria. Reassurance provided. He admits to depression in  addition to anxiety. Started Lexapro yesterday. If pain persists then I am happy to see him back   3. Colon cancer screening. Not due until 2021 unless develops sx.   I spent 25 minutes of face-to-face time with the patient. Greater than 50% of the time was spent counseling and coordinating care. Questions answered   [name] , NP 04/30/2017, 10:38 AM

## 2017-04-30 NOTE — Patient Instructions (Signed)
If you are age 77 or older, your body mass index should be between 23-30. Your Body mass index is 24.02 kg/m. If this is out of the aforementioned range listed, please consider follow up with your Primary Care Provider.  If you are age 83 or younger, your body mass index should be between 19-25. Your Body mass index is 24.02 kg/m. If this is out of the aformentioned range listed, please consider follow up with your Primary Care Provider.   Follow up as needed.  Thank you for choosing me and [facility].   [name], NP

## 2017-05-01 ENCOUNTER — Encounter: Payer: Self-pay | Admitting: Family Medicine

## 2017-05-01 NOTE — Progress Notes (Signed)
Agree with assessment and plan as outlined. Await course on Lexapro, he seemed depressed at my last visit with him, hopefully this helps. His last colonoscopy was in 2011 and normal. If his symptoms of pain persist, can proceed with colonoscopy or consider imaging.

## 2017-05-31 ENCOUNTER — Ambulatory Visit: Payer: Self-pay | Admitting: Family Medicine

## 2017-06-03 ENCOUNTER — Encounter: Payer: Self-pay | Admitting: Internal Medicine

## 2017-06-03 ENCOUNTER — Ambulatory Visit (INDEPENDENT_AMBULATORY_CARE_PROVIDER_SITE_OTHER): Payer: Medicare Other | Admitting: Internal Medicine

## 2017-06-03 VITALS — BP 122/64 | HR 98 | Temp 97.8°F | Ht 70.0 in | Wt 161.6 lb

## 2017-06-03 DIAGNOSIS — I480 Paroxysmal atrial fibrillation: Secondary | ICD-10-CM | POA: Diagnosis not present

## 2017-06-03 DIAGNOSIS — E039 Hypothyroidism, unspecified: Secondary | ICD-10-CM | POA: Diagnosis not present

## 2017-06-03 NOTE — Progress Notes (Addendum)
Subjective:    Patient ID: Jesse [name], male    DOB: 09-04-1940, 77 y.o.   MRN: [id number]  HPI 77 year old patient who is seen today for follow-up of recurrent major depression. He was started on Lexapro 5 mg daily at the time of his last visit.  He took this for a few days and then was off the medication for 25 days before resuming on December 28.  Now the medications are dispensed by his wife.  He stopped the medication due to some daytime somnolence but now is taking the medication at bedtime and tolerating the medication well.  He states that he feels much improved on this medication.  He is looking forward to a trip to [place] in a few days  Past Medical History:  Diagnosis Date  . Arthritis   . Atrial fibrillation ([place])   . Complication of anesthesia    Atrial fibrillation during surgery   . Hard of hearing   . Hx of constipation   . Osteoporosis    DEXA 11/09/10. Completed treatemtn with Fosamax.  . Prostate cancer ([place])   . Restless legs   . Thyroid disease    hypothyroidism  . Vitiligo      Social History   Socioeconomic History  . Marital status: Married    Spouse name: Not on file  . Number of children: Not on file  . Years of education: Not on file  . Highest education level: Not on file  Social Needs  . Financial resource strain: Not on file  . Food insecurity - worry: Not on file  . Food insecurity - inability: Not on file  . Transportation needs - medical: Not on file  . Transportation needs - non-medical: Not on file  Occupational History  . Not on file  Tobacco Use  . Smoking status: Former Smoker    Types: Cigarettes    Last attempt to quit: 05/28/1974    Years since quitting: 43.0  . Smokeless tobacco: Never Used  . Tobacco comment: smoked very little   Substance and Sexual Activity  . Alcohol use: Yes    Comment: 4-5 standard drinks per week/ stopped drinkin   . Drug use: No  . Sexual activity: Yes  Other Topics Concern  . Not on file    Social History Narrative  . Not on file    Past Surgical History:  Procedure Laterality Date  . BACK SURGERY    . COLONOSCOPY    . JOINT REPLACEMENT    . LAMINECTOMY  10/2011   cervical and lumbar laminectomy  . PROSTATE BIOPSY  07/10/2011  . PROSTATE BIOPSY  09/22/15  . TOTAL HIP ARTHROPLASTY Right 02/29/2012  . TOTAL HIP ARTHROPLASTY Right 07/04/2016   Procedure: RIGHT TOTAL HIP ARTHROPLASTY ANTERIOR APPROACH;  Surgeon: Rod Can, MD;  Location: [facility];  Service: Orthopedics;  Laterality: Right;  . TRANSURETHRAL RESECTION OF PROSTATE      Family History  Problem Relation Age of Onset  . Atrial fibrillation Mother   . Atrial fibrillation Sister   . Cancer Neg Hx     No Known Allergies  Current Outpatient Medications on File Prior to Visit  Medication Sig Dispense Refill  . escitalopram (LEXAPRO) 5 MG tablet Take 1 tablet (5 mg total) by mouth daily. 60 tablet 2  . Glucosamine HCl-MSM (GLUCOSAMINE-MSM PO) Take 1 tablet by mouth daily.    . Glucosamine-Chondroit-Vit C-Mn (GLUCOSAMINE-CHONDROITIN) TABS Take 1 tablet by mouth daily.    Marland Kitchen levothyroxine (  SYNTHROID, LEVOTHROID) 50 MCG tablet Take 1 tablet (50 mcg total) daily before breakfast by mouth. 90 tablet 3  . Multiple Vitamins-Minerals (MULTIVITAMIN ADULT PO) Take 1 tablet by mouth daily. One a Day    . polyethylene glycol (MIRALAX / GLYCOLAX) packet Take 17 g by mouth daily as needed.     No current facility-administered medications on file prior to visit.     BP 122/64 (BP Location: Left Arm, Patient Position: Sitting, Cuff Size: Normal)   Pulse 98   Temp 97.8 F (36.6 C) (Oral)   Ht 5\' 10"  (1.778 m)   Wt 161 lb 9.6 oz (73.3 kg)   SpO2 97%   BMI 23.19 kg/m      Review of Systems  Psychiatric/Behavioral: Positive for decreased concentration, dysphoric mood and sleep disturbance.       Objective:   Physical Exam  Constitutional: He appears well-developed and well-nourished. No distress.  Psychiatric:  He has a normal mood and affect. His behavior is normal.  Flat affect           Assessment & Plan:   Major depression; exacerbation-improved  Continue same  F/u 6 weeks  Nyoka Cowden

## 2017-06-03 NOTE — Patient Instructions (Signed)
Continue Lexapro 5 mg daily at bedtime  Return in 6 weeks for follow-up or sooner if not pleased with your progress

## 2017-06-13 ENCOUNTER — Other Ambulatory Visit: Payer: Self-pay

## 2017-06-13 ENCOUNTER — Encounter (HOSPITAL_COMMUNITY): Payer: Self-pay | Admitting: Emergency Medicine

## 2017-06-13 ENCOUNTER — Emergency Department (HOSPITAL_COMMUNITY)
Admission: EM | Admit: 2017-06-13 | Discharge: 2017-06-14 | Disposition: A | Discharge status: Home / Self Care | Payer: Medicare Other | Attending: Emergency Medicine | Admitting: Emergency Medicine

## 2017-06-13 DIAGNOSIS — R4589 Other symptoms and signs involving emotional state: Secondary | ICD-10-CM

## 2017-06-13 DIAGNOSIS — F329 Major depressive disorder, single episode, unspecified: Secondary | ICD-10-CM | POA: Diagnosis present

## 2017-06-13 DIAGNOSIS — Z96641 Presence of right artificial hip joint: Secondary | ICD-10-CM | POA: Diagnosis not present

## 2017-06-13 DIAGNOSIS — Z79899 Other long term (current) drug therapy: Secondary | ICD-10-CM | POA: Diagnosis not present

## 2017-06-13 DIAGNOSIS — E039 Hypothyroidism, unspecified: Secondary | ICD-10-CM | POA: Diagnosis not present

## 2017-06-13 DIAGNOSIS — Z87891 Personal history of nicotine dependence: Secondary | ICD-10-CM | POA: Insufficient documentation

## 2017-06-13 DIAGNOSIS — R45851 Suicidal ideations: Secondary | ICD-10-CM | POA: Diagnosis not present

## 2017-06-13 DIAGNOSIS — F331 Major depressive disorder, recurrent, moderate: Secondary | ICD-10-CM | POA: Insufficient documentation

## 2017-06-13 DIAGNOSIS — Z8546 Personal history of malignant neoplasm of prostate: Secondary | ICD-10-CM | POA: Diagnosis not present

## 2017-06-13 DIAGNOSIS — F32A Depression, unspecified: Secondary | ICD-10-CM

## 2017-06-13 LAB — ETHANOL: Alcohol, Ethyl (B): <10 mg/dL (ref <10)

## 2017-06-13 LAB — CBC
HCT: 40.7 % (ref 39.0–52.0)
Hemoglobin: 13.7 g/dL (ref 13.0–17.0)
MCH: 29.2 pg (ref 26.0–34.0)
MCHC: 33.7 g/dL (ref 30.0–36.0)
MCV: 86.8 fL (ref 78.0–100.0)
Platelets: 205 10*3/uL (ref 150–400)
RBC: 4.69 MIL/uL (ref 4.22–5.81)
RDW: 13.8 % (ref 11.5–15.5)
WBC: 5.1 10*3/uL (ref 4.0–10.5)

## 2017-06-13 LAB — COMPREHENSIVE METABOLIC PANEL
ALT: 15 U/L — ABNORMAL LOW (ref 17–63)
AST: 30 U/L (ref 15–41)
Albumin: 4.1 g/dL (ref 3.5–5.0)
Alkaline Phosphatase: 66 U/L (ref 38–126)
Anion gap: 9 (ref 5–15)
BUN: 22 mg/dL — ABNORMAL HIGH (ref 6–20)
CO2: 24 mmol/L (ref 22–32)
Calcium: 9.1 mg/dL (ref 8.9–10.3)
Chloride: 105 mmol/L (ref 101–111)
Creatinine, Ser: 0.93 mg/dL (ref 0.61–1.24)
GFR calc Af Amer: >60 mL/min (ref >60)
GFR calc non Af Amer: >60 mL/min (ref >60)
Glucose, Bld: 99 mg/dL (ref 65–99)
Potassium: 4.4 mmol/L (ref 3.5–5.1)
Sodium: 138 mmol/L (ref 135–145)
Total Bilirubin: 0.6 mg/dL (ref 0.3–1.2)
Total Protein: 6.6 g/dL (ref 6.5–8.1)

## 2017-06-13 LAB — SALICYLATE LEVEL: Salicylate Lvl: <7 mg/dL (ref 2.8–30.0)

## 2017-06-13 LAB — RAPID URINE DRUG SCREEN, HOSP PERFORMED
Amphetamines: NOT DETECTED
Barbiturates: NOT DETECTED
Benzodiazepines: NOT DETECTED
Cocaine: NOT DETECTED
Opiates: NOT DETECTED
Tetrahydrocannabinol: NOT DETECTED

## 2017-06-13 LAB — ACETAMINOPHEN LEVEL: Acetaminophen (Tylenol), Serum: <10 ug/mL — ABNORMAL LOW (ref 10–30)

## 2017-06-13 MED ORDER — GLUCOSAMINE-CHONDROITIN PO TABS: 1.0000 | ORAL_TABLET | Freq: Every day | ORAL | Status: DC

## 2017-06-13 MED ORDER — POLYETHYLENE GLYCOL 3350 17 G PO PACK
17.0000 g | PACK | Freq: Two times a day (BID) | ORAL | Status: DC
  Administered 2017-06-14: 17 g via ORAL
  Filled 2017-06-13 (×3): qty 1

## 2017-06-13 MED ORDER — LEVOTHYROXINE SODIUM 50 MCG PO TABS
50.0000 ug | ORAL_TABLET | Freq: Every day | ORAL | Status: DC
  Administered 2017-06-14: 50 ug via ORAL
  Filled 2017-06-13: qty 1

## 2017-06-13 MED ORDER — ADULT MULTIVITAMIN W/MINERALS CH
ORAL_TABLET | Freq: Every day | ORAL | Status: DC
  Administered 2017-06-14: 1 via ORAL
  Filled 2017-06-13 (×2): qty 1

## 2017-06-13 MED ORDER — ESCITALOPRAM OXALATE 10 MG PO TABS
5.0000 mg | ORAL_TABLET | Freq: Every day | ORAL | Status: DC
  Administered 2017-06-13 – 2017-06-14 (×2): 5 mg via ORAL
  Filled 2017-06-13 (×2): qty 1

## 2017-06-13 NOTE — ED Triage Notes (Signed)
pts wife reports pt has hx of depression, started having depression again in July, states pt is prescribed medication for depression that she has been making sure he is taking. Pt states in triage "my bowels are not working and will never work again, eventually, I am forcing myself to eat." pt states "I have done a disservice to my wife and I need to be placed in a psych unit until I die because I cannot help anyone." denies hi.

## 2017-06-13 NOTE — ED Notes (Signed)
TTS AT THIS TIME

## 2017-06-13 NOTE — BH Assessment (Addendum)
Tele Assessment Note   Patient Name: Jesse [name] MRN: [id number] Referring Physician: Ozella Almond Ward, PA-C Location of Patient: [facility] Location of Provider: [place] is an 77 y.o. male who presents voluntarily to the [facility] reporting symptoms of depression and suicidal ideation. Pt has a history of depression and says he was referred for assessment by his wife.  Pt reports current suicidal ideation without a plan . Pt denies any past attempts. Pt acknowledges symptoms including: sadness, fatigue, guilt, tearfulness, isolating, anger, difficulty concentrating, helplessness, hopelessness, sleeping less, eating less, intrusive thoughts, excessive worry and flashback of the things he's done in his life. Pt denies homicidal ideation and history of violence. Pt denies auditory or visual hallucinations or other psychotic symptoms. Pt states current stressors include his health and not being able to do the things that he once did.  Pt states "I am a burden on my wife", I can't go on anymore", I'm not able to help my wife and I made a big mess that she had to clean up alone", and "I need to die and go to hell".   Pt lives with is wife and she supports him.  Pt denies history of abuse and trauma. Pt denies family history of suicide, mental health issues and substance abuse. Pt has poor insight and judgment. Pt denies legal history.  Pt's OP history includes seeing a doctor at [facility], however pt reports he stopped going. Pt denies IP history. Pt denies alcohol and reports using caffeine pills in the past.  Pt is dressed in scrubs, alert, oriented x4 with normal speech and normal motor behavior. Eye contact is good. Pt's mood is depressed and affect is depressed and flat. Affect is congruent with mood. Thought process is coherent and relevant. There is no indication Pt is currently responding to internal stimuli or experiencing delusional thought content. Pt was  cooperative throughout assessment. Pt is currently unable to contract for safety outside the hospital.    Diagnosis: F33.1 Major depressive disorder, Recurrent episode, Moderate  Past Medical History:  Past Medical History:  Diagnosis Date  . Arthritis   . Atrial fibrillation ([place])   . Complication of anesthesia    Atrial fibrillation during surgery   . Hard of hearing   . Hx of constipation   . Osteoporosis    DEXA 11/09/10. Completed treatemtn with Fosamax.  . Prostate cancer ([place])   . Restless legs   . Thyroid disease    hypothyroidism  . Vitiligo     Past Surgical History:  Procedure Laterality Date  . BACK SURGERY    . COLONOSCOPY    . JOINT REPLACEMENT    . LAMINECTOMY  10/2011   cervical and lumbar laminectomy  . PROSTATE BIOPSY  07/10/2011  . PROSTATE BIOPSY  09/22/15  . TOTAL HIP ARTHROPLASTY Right 02/29/2012  . TOTAL HIP ARTHROPLASTY Right 07/04/2016   Procedure: RIGHT TOTAL HIP ARTHROPLASTY ANTERIOR APPROACH;  Surgeon: Rod Can, MD;  Location: [facility];  Service: Orthopedics;  Laterality: Right;  . TRANSURETHRAL RESECTION OF PROSTATE      Family History:  Family History  Problem Relation Age of Onset  . Atrial fibrillation Mother   . Atrial fibrillation Sister   . Cancer Neg Hx     Social History:  reports that he quit smoking about 43 years ago. His smoking use included cigarettes. he has never used smokeless tobacco. He reports that he drinks alcohol. He reports that he does not use  drugs.  Additional Social History:  Alcohol / Drug Use Pain Medications: See MAR Prescriptions: See MAR Over the Counter: See MAR History of alcohol / drug use?: Yes Substance #1 Name of Substance 1: Caffeine 1 - Age of First Use: Pt states he doesn't remember 1 - Amount (size/oz): Pt states he doesn't remember 1 - Frequency: Pt states he doesn't remember 1 - Duration: Pt states he doesn't remember 1 - Last Use / Amount: 3 years ago  CIWA: CIWA-Ar BP: 122/70 Pulse  Rate: 65 COWS:    Allergies: No Known Allergies  Home Medications:  (Not in a hospital admission)  OB/GYN Status:  No LMP for male patient.  General Assessment Data Location of Assessment: [facility] ED TTS Assessment: In system Is this a Tele or Face-to-Face Assessment?: Tele Assessment Is this an Initial Assessment or a Re-assessment for this encounter?: Initial Assessment Marital status: Married [place] name: N/A Is patient pregnant?: No Pregnancy Status: No Living Arrangements: Spouse/significant other Can pt return to current living arrangement?: Yes Admission Status: Voluntary Is patient capable of signing voluntary admission?: Yes Referral Source: Self/Family/Friend Insurance type: Medicare     Crisis Care Plan Living Arrangements: Spouse/significant other  Education Status Highest grade of school patient has completed: Bachelor's Degree in Biology  Risk to self with the past 6 months Suicidal Ideation: Yes-Currently Present Has patient been a risk to self within the past 6 months prior to admission? : No Suicidal Intent: Yes-Currently Present Has patient had any suicidal intent within the past 6 months prior to admission? : Yes Is patient at risk for suicide?: Yes Suicidal Plan?: No Has patient had any suicidal plan within the past 6 months prior to admission? : No Access to Means: No What has been your use of drugs/alcohol within the last 12 months?: Pt denies Previous Attempts/Gestures: No How many times?: 0 Other Self Harm Risks: None reported Triggers for Past Attempts: None known Intentional Self Injurious Behavior: None Family Suicide History: No Recent stressful life event(s): Recent negative physical changes(Pt states he is a burden to his wife) Persecutory voices/beliefs?: No Depression: Yes Depression Symptoms: Despondent, Insomnia, Tearfulness, Isolating, Fatigue, Guilt, Loss of interest in usual pleasures, Feeling worthless/self pity, Feeling  angry/irritable Substance abuse history and/or treatment for substance abuse?: No Suicide prevention information given to non-admitted patients: Not applicable  Risk to Others within the past 6 months Homicidal Ideation: No Does patient have any lifetime risk of violence toward others beyond the six months prior to admission? : No Thoughts of Harm to Others: No Current Homicidal Intent: No Current Homicidal Plan: No Access to Homicidal Means: No Identified Victim: None reported History of harm to others?: No Assessment of Violence: None Noted Violent Behavior Description: N/A Does patient have access to weapons?: No Criminal Charges Pending?: No Does patient have a court date: No Is patient on probation?: No  Psychosis Hallucinations: None noted Delusions: None noted  Mental Status Report Appearance/Hygiene: In scrubs Eye Contact: Good Motor Activity: Freedom of movement Speech: Logical/coherent, Slow Level of Consciousness: Alert Mood: Depressed, Sad, Worthless, low self-esteem Affect: Flat, Depressed, Sad Anxiety Level: None Thought Processes: Coherent, Relevant Judgement: Impaired Orientation: Person, Place, Time, Situation, Appropriate for developmental age  Cognitive Functioning Concentration: Normal Memory: Recent Intact, Remote Intact IQ: Average Insight: Poor Impulse Control: Fair Appetite: Poor Weight Loss: 20 Weight Gain: 0 Sleep: Decreased Total Hours of Sleep: 2 Vegetative Symptoms: Staying in bed  ADLScreening [facility] Assessment Services) Patient's cognitive ability adequate to safely complete daily activities?:  Yes Patient able to express need for assistance with ADLs?: Yes Independently performs ADLs?: Yes (appropriate for developmental age)  Prior Inpatient Therapy Prior Inpatient Therapy: No Prior Therapy Dates: N/A Prior Therapy Facilty/Provider(s): N/A Reason for Treatment: N/A  Prior Outpatient Therapy Prior Outpatient Therapy: Yes Prior  Therapy Dates: unknown Prior Therapy Facilty/Provider(s): [facility] Reason for Treatment: Depression Does patient have an ACCT team?: No Does patient have Intensive In-House Services?  : No Does patient have [facility] services? : No Does patient have P4CC services?: No  ADL Screening (condition at time of admission) Patient's cognitive ability adequate to safely complete daily activities?: Yes Is the patient deaf or have difficulty hearing?: No Does the patient have difficulty seeing, even when wearing glasses/contacts?: No Does the patient have difficulty concentrating, remembering, or making decisions?: No Patient able to express need for assistance with ADLs?: Yes Does the patient have difficulty dressing or bathing?: No Independently performs ADLs?: Yes (appropriate for developmental age) Does the patient have difficulty walking or climbing stairs?: Yes(Pt states he has had 2 hip replacements) Weakness of Legs: None Weakness of Arms/Hands: None  Home Assistive Devices/Equipment Home Assistive Devices/Equipment: None    Abuse/Neglect Assessment (Assessment to be complete while patient is alone) Abuse/Neglect Assessment Can Be Completed: Yes Physical Abuse: Denies Verbal Abuse: Denies Sexual Abuse: Denies Exploitation of patient/patient's resources: Denies Self-Neglect: Denies     Regulatory affairs officer (For Healthcare) Does Patient Have a Medical Advance Directive?: No Would patient like information on creating a medical advance directive?: No - Patient declined    Additional Information 1:1 In Past 12 Months?: No CIRT Risk: No Elopement Risk: No Does patient have medical clearance?: Yes     Disposition: Gave clinical report to Lindon Romp, [place] who stated Pt meets criteria for impatient psychiatric treatment.  SW to see gero-psych in the morning.  Notified Callie Fielding, RN of the recommendation and attempted to reach [facility], PA-C of  recommendation.  Disposition Initial Assessment Completed for this Encounter: Yes Disposition of Patient: Inpatient treatment program Type of inpatient treatment program: Adult  This service was provided via telemedicine using a 2-way, interactive audio and video technology.  Names of all persons participating in this telemedicine service and their role in this encounter. Name: Clemens Catholic Role: Patient  Name: Abran Cantor, MS, [facility] Role: TTS Counselor  Name:  Role:   Name:  Role:     Abran Cantor, [place], [facility] Therapeutic Triage Specialist  Abran Cantor 06/13/2017 11:00 PM

## 2017-06-13 NOTE — ED Provider Notes (Signed)
[facility] EMERGENCY DEPARTMENT Provider Note   CSN: [id number] Arrival date & time: 06/13/17  1706     History   Chief Complaint Chief Complaint  Patient presents with  . Depression    HPI MARKEL KURTENBACH is a 77 y.o. male.  The history is provided by the patient and medical records. No language interpreter was used.   JOEVON HOLLIMAN is a 77 y.o. male  with a PMH of depression, thyroid disorder who presents to the Emergency Department complaining of depressed mood for several months.  Patient states that over the last several weeks, he has been unable to care for maintenance and other things around the house.  He feels like a huge burden on his wife and this has made him very upset.  When asked if he had any thoughts about harming himself, he again told me how he felt like it disservice to his wife and does not want her to have to deal with him anymore.  Denies any thoughts of hurting others.  He reports taking Lexapro daily, but does not feel like it is helping very much.  He also reports constipation, stating he is unsure when his last bowel movement was.  Denies any abdominal pain or nausea.   Past Medical History:  Diagnosis Date  . Arthritis   . Atrial fibrillation ([place])   . Complication of anesthesia    Atrial fibrillation during surgery   . Hard of hearing   . Hx of constipation   . Osteoporosis    DEXA 11/09/10. Completed treatemtn with Fosamax.  . Prostate cancer ([place])   . Restless legs   . Thyroid disease    hypothyroidism  . Vitiligo     Patient Active Problem List   Diagnosis Date Noted  . Depression, major, single episode, severe ([place]) 12/14/2016  . Primary osteoarthritis of right hip 07/04/2016  . Vitiligo 06/11/2016  . Osteoarthritis of right hip 06/05/2016  . Malignant neoplasm of prostate ([place]) 12/07/2015  . Atrial fibrillation ([place]) 12/07/2015  . Hypothyroidism 12/07/2015    Past Surgical History:  Procedure Laterality Date  .  BACK SURGERY    . COLONOSCOPY    . JOINT REPLACEMENT    . LAMINECTOMY  10/2011   cervical and lumbar laminectomy  . PROSTATE BIOPSY  07/10/2011  . PROSTATE BIOPSY  09/22/15  . TOTAL HIP ARTHROPLASTY Right 02/29/2012  . TOTAL HIP ARTHROPLASTY Right 07/04/2016   Procedure: RIGHT TOTAL HIP ARTHROPLASTY ANTERIOR APPROACH;  Surgeon: Rod Can, MD;  Location: [facility];  Service: Orthopedics;  Laterality: Right;  . TRANSURETHRAL RESECTION OF PROSTATE         Home Medications    Prior to Admission medications   Medication Sig Start Date End Date Taking? Authorizing Provider  escitalopram (LEXAPRO) 5 MG tablet Take 1 tablet (5 mg total) by mouth daily. 04/29/17   Marletta Lor, MD  Glucosamine HCl-MSM (GLUCOSAMINE-MSM PO) Take 1 tablet by mouth daily.    [provider]  Glucosamine-Chondroit-Vit C-Mn (GLUCOSAMINE-CHONDROITIN) TABS Take 1 tablet by mouth daily.    [provider]  levothyroxine (SYNTHROID, LEVOTHROID) 50 MCG tablet Take 1 tablet (50 mcg total) daily before breakfast by mouth. 04/12/17   Martinique, Betty G, MD  Multiple Vitamins-Minerals (MULTIVITAMIN ADULT PO) Take 1 tablet by mouth daily. One a Day    [provider]  polyethylene glycol (MIRALAX / GLYCOLAX) packet Take 17 g by mouth daily as needed.    [provider]  Family History Family History  Problem Relation Age of Onset  . Atrial fibrillation Mother   . Atrial fibrillation Sister   . Cancer Neg Hx     Social History Social History   Tobacco Use  . Smoking status: Former Smoker    Types: Cigarettes    Last attempt to quit: 05/28/1974    Years since quitting: 43.0  . Smokeless tobacco: Never Used  . Tobacco comment: smoked very little   Substance Use Topics  . Alcohol use: Yes    Comment: 4-5 standard drinks per week/ stopped drinkin   . Drug use: No     Allergies   Patient has no known allergies.   Review of Systems Review of Systems  Gastrointestinal:  Positive for constipation. Negative for abdominal pain, diarrhea, nausea and vomiting.  All other systems reviewed and are negative.    Physical Exam Updated Vital Signs BP 122/70 (BP Location: Right Arm)   Pulse 65   Temp 97.6 F (36.4 C) (Oral)   Resp 15   SpO2 97%   Physical Exam  Constitutional: He is oriented to person, place, and time. He appears well-developed and well-nourished. No distress.  HENT:  Head: Normocephalic and atraumatic.  Cardiovascular: Normal rate, regular rhythm and normal heart sounds.  No murmur heard. Pulmonary/Chest: Effort normal and breath sounds normal. No respiratory distress.  Abdominal: Soft. He exhibits no distension.  No abdominal tenderness.  Genitourinary:  Genitourinary Comments: No fecal impaction.  Neurological: He is alert and oriented to person, place, and time.  Skin: Skin is warm and dry.  Nursing note and vitals reviewed.    ED Treatments / Results  Labs (all labs ordered are listed, but only abnormal results are displayed) Labs Reviewed  COMPREHENSIVE METABOLIC PANEL - Abnormal; Notable for the following components:      Result Value   BUN 22 (*)    ALT 15 (*)    All other components within normal limits  ACETAMINOPHEN LEVEL - Abnormal; Notable for the following components:   Acetaminophen (Tylenol), Serum <10 (*)    All other components within normal limits  ETHANOL  SALICYLATE LEVEL  CBC  RAPID URINE DRUG SCREEN, HOSP PERFORMED    EKG  EKG Interpretation None       Radiology No results found.  Procedures Procedures (including critical care time)  Medications Ordered in ED Medications  escitalopram (LEXAPRO) tablet 5 mg (not administered)  levothyroxine (SYNTHROID, LEVOTHROID) tablet 50 mcg (not administered)  multivitamin with minerals tablet (not administered)  polyethylene glycol (MIRALAX / GLYCOLAX) packet 17 g (not administered)     Initial Impression / Assessment and Plan / ED Course  I have  reviewed the triage vital signs and the nursing notes.  Pertinent labs & imaging results that were available during my care of the patient were reviewed by me and considered in my medical decision making (see chart for details).    DAVARION CUFFEE is a 77 y.o. male who presents to ED for depressed mood for several months. Additionally complained of constipation. Abdomen soft, non-distended with no tenderness. No impaction or rectal exam. He is on miralax daily. Will increase to BID. Labs reviewed and reassuring. Medically cleared with disposition per TTS recommendations.   Patient discussed with Dr. Sabra Heck who agrees with treatment plan.   Final Clinical Impressions(s) / ED Diagnoses   Final diagnoses:  Depressed mood    ED Discharge Orders    None       Leeandre Nordling, [place] Cerise  Pilcher, PA-C 06/13/17 2205    Noemi Chapel, MD 06/14/17 [phone]

## 2017-06-13 NOTE — ED Notes (Signed)
Staffing called for sitter.   

## 2017-06-13 NOTE — ED Notes (Signed)
Patient given maroon scrubs to change into

## 2017-06-13 NOTE — ED Notes (Signed)
Writer informed pt that urine sample is needed for collection.

## 2017-06-14 ENCOUNTER — Ambulatory Visit: Payer: Self-pay | Admitting: Family Medicine

## 2017-06-14 ENCOUNTER — Encounter (HOSPITAL_COMMUNITY): Payer: Self-pay | Admitting: Registered Nurse

## 2017-06-14 DIAGNOSIS — F331 Major depressive disorder, recurrent, moderate: Secondary | ICD-10-CM | POA: Diagnosis not present

## 2017-06-14 LAB — T4, FREE: Free T4: 1.09 ng/dL (ref 0.61–1.12)

## 2017-06-14 LAB — TSH: TSH: 1.548 u[IU]/mL (ref 0.350–4.500)

## 2017-06-14 MED ORDER — DOCUSATE SODIUM 100 MG PO CAPS: 100.0000 mg | ORAL_CAPSULE | Freq: Two times a day (BID) | ORAL | 0 refills | Status: DC

## 2017-06-14 MED ORDER — POLYETHYLENE GLYCOL 3350 17 G PO PACK: 17.0000 g | PACK | Freq: Every day | ORAL | 0 refills | Status: DC

## 2017-06-14 MED ORDER — ESCITALOPRAM OXALATE 10 MG PO TABS: 10.0000 mg | ORAL_TABLET | Freq: Every day | ORAL | 0 refills | Status: DC

## 2017-06-14 NOTE — Discharge Instructions (Signed)
We are sorry about yourdepression.  Please continue to try to eat normally.  If you are having trouble, with constipation please take Colace daily.  You may take the packet of MiraLAX daily up to 3 packets a day until you are having normal stooling.  We have also increased her Lexapro to 10 mg.  Please follow-up immediately with outpatient psychiatry.  The information has been provided.  Please return if you are feeling suicidal.

## 2017-06-14 NOTE — ED Notes (Signed)
Pt reports feeling constipated and not sure when his last BM was. Pt refused miralax last night, offered again this morning, or to get order for something else. He refused. Will continue to offer to patient throughout the day. He didn't eat any of his breakfast.

## 2017-06-14 NOTE — ED Notes (Signed)
TTS at bedside. Pt is resting, refusing to eat breakfast. Offered snacks, other beverages. Doesn't want to take meds until he speaks with the doctor.

## 2017-06-14 NOTE — Consult Note (Signed)
  Tele Assessment   Jesse [name], 77 y.o., male patient presented to [facility] with complaints of depression and suicidal ideation.    Patient seen via telepsych by this provider; chart reviewed and consulted with Dr. Dwyane Dee on 06/14/17.  On evaluation Jesse [name] reports  "I'm dying and going to hell."   Patient asked if he was having thoughts of hurting or killing himself and her responded "No"  Patient does endorse depression and his stressor being "I can't do the things that I need to do." When asked what things he was referring to he stated the generator, car, things around the house.  Patient denies suicidal/homicidal/self-harm ideation, psychosis, and paranoia.  Patient states that he is seeing a psychiatrist and is taking Lexapro.  Patient lives with his wife and 33 yr old mother-in-law.   During evaluation Jesse [name] is alert/oriented x 4; calm/cooperative with depressed affect.  He does not appear to be responding to internal/external stimuli.  Patient denies suicidal/self-harm/homicidal ideation, psychosis, and paranoia.  Patient answered question appropriately.  Patient currently has psychiatric outpatient services; records indicate that patient was started on Lexapro 12/18 but only took for a few days and restarted 06/03/17 and is now given to him by his wife.  Patient currently taking Lexapro 5 mg daily without adverse reaction; recommendation to increase to 10 mg since patient is tolerating medication.  Patient psychiatrically cleared but informed patient that he needed to follow up with his psychiatrist for medication management/adjustment to help with depression.  Understanding voiced and agreed.   Recommendations:  Psychiatrically cleared.  Increase Lexapro to 10 mg daily; Follow up with his current psychiatric provider/PCP  EDP to give prescription for Lexapro 10 mg until can see his outpatient provider  Disposition: No evidence of imminent risk to self or others at present.   Patient  does not meet criteria for psychiatric inpatient admission. Supportive therapy provided about ongoing stressors. Discussed crisis plan, support from social network, calling 911, coming to the Emergency Department, and calling Suicide Hotline.   Shuvon B. Rankin, NP    Addendum 06/13/17 1:15 pm  Spoke with wife of patient who has concerns about patient coming home.  States that patient has been having problems with bowel movement and that he has not been eating as normal.  States that she is also taking care of her 42 year old mother and she doesn't feel like she will be able to care for and watch patient like he will need.  Informed Jesse [name] of the psychiatric assessment that was done this morning explaining that patient does have some depression but he denies suicidal/homicidal/self-harm ideation, psychosis, and paranoia therefore patient does not meet criteria for inpatient psychiatric treatment.  Informed that I recommended that Lexapro be increased to 10 mg and that we will send her information for outpatient psychiatric treatment for medication management and therapy.  Also inform her to speak with the EDP regarding patient bowel issues and any medical concerns that she had with patient; also informed that social work may be able to help with home health evaluation.    Shuvon B. Rankin, NP

## 2017-06-14 NOTE — ED Notes (Signed)
[name] arrived at bedside, made aware of disposition. She reports that she doesn't feel safe taking him home b/c he is so depressed. Will call TTS to make them aware.

## 2017-06-14 NOTE — ED Notes (Signed)
Meal Tray ordered at 11:27 am

## 2017-06-14 NOTE — ED Notes (Signed)
This RN spoke with pt's wife on phone, made aware of pt's being ready for d/c with previously discussed paperwork. Wife sts will be here after 1915.

## 2017-06-14 NOTE — ED Notes (Signed)
Pt finished his TTS evaluation.

## 2017-06-14 NOTE — ED Notes (Signed)
Spoke with psych NP, reports will send over outpatient resources via fax for patient.

## 2017-06-14 NOTE — ED Notes (Signed)
He did drink 8 oz of apple juice and took miralax. Dr. Billy Fischer made aware of plan. Pending thyroid blood results.

## 2017-06-14 NOTE — ED Notes (Addendum)
[name] spoke with psych NP, they agree that pt needs to have a BM before he left, she would given resources for outpatient psychiatrist, rx for lexapro 10 mg, and also referral for psychologist. His wife Tracie Harrier [phone].

## 2017-06-14 NOTE — ED Notes (Signed)
Spoke with TTS, they will call back to speak with patients wife.

## 2017-06-14 NOTE — ED Notes (Signed)
E signature pad unavailable. Pt and wife verbalize understanding of d/c instructions.

## 2017-06-14 NOTE — ED Notes (Signed)
Pt refusing to eat any snacks or take miralax. He was agreeable to drinking 8 oz of apple juice.

## 2017-06-14 NOTE — Progress Notes (Signed)
Per Earleen Newport, NP, the patient does not meet criteria for inpatient treatment. The patient is recommended for discharge and to follow up with his outpatient provider for medication management.   The patient is psychiatrically cleared at this time. The patient was seen via telepsych with [place], Shuvon Rankin, NP. The patient's chart was reviewed and consulted with Dr. Dwyane Dee on 06/14/17.    Butler Denmark, RN notified.      Radonna Ricker, MSW, [place] Clinical Social Worker (Disposition) [facility]  [phone]

## 2017-06-14 NOTE — ED Notes (Signed)
Spoke with TTS, they report pt has been psych cleared, can d/c. They do recommend an increase in his lexapro to 10 mg. Will make Dr. Billy Fischer aware. Also, did make TTS aware of the fact that he is not eating and refused his meds last night.

## 2017-06-18 ENCOUNTER — Encounter: Payer: Self-pay | Admitting: Psychology

## 2017-06-18 ENCOUNTER — Telehealth: Payer: Self-pay | Admitting: Family Medicine

## 2017-06-18 NOTE — Telephone Encounter (Signed)
Routed to Dr. Jordan for review. 

## 2017-06-18 NOTE — Telephone Encounter (Signed)
Copied from [place]. Topic: Referral - Request >> Jun 18, 2017  2:27 PM Bea Graff, NT wrote: Reason for CRM: Patients wife calling requesting a referral to a psychiatrist, Dr. Melvenia Beam, phone: [phone]. Fax: [phone]. He had a bad episode with depression last Thursday. Was seen in emergency room.

## 2017-06-19 ENCOUNTER — Other Ambulatory Visit: Payer: Self-pay | Admitting: Internal Medicine

## 2017-06-19 ENCOUNTER — Telehealth: Payer: Self-pay | Admitting: Internal Medicine

## 2017-06-19 ENCOUNTER — Other Ambulatory Visit: Payer: Self-pay | Admitting: Family Medicine

## 2017-06-19 DIAGNOSIS — F339 Major depressive disorder, recurrent, unspecified: Secondary | ICD-10-CM

## 2017-06-19 DIAGNOSIS — R413 Other amnesia: Secondary | ICD-10-CM

## 2017-06-19 NOTE — Telephone Encounter (Unsigned)
Copied from [place]. Topic: Referral - Request >> Jun 18, 2017  2:27 PM Bea Graff, NT wrote: Reason for CRM: Patients wife calling requesting a referral to a psychiatrist, Dr. Melvenia Beam, phone: [phone]. Fax: [phone]. He had a bad episode with depression last Thursday. Was seen in emergency room.   >> Jun 19, 2017 11:15 AM Neva Seat wrote: Reason for CRM: Patients wife calling requesting a referral to a psychiatrist, Dr. Melvenia Beam, phone: [phone]. Fax: [phone]. He had a bad episode with depression last Thursday. Was seen in emergency room.   Pt called back today 06-19-17, checking on the status of the referral she requested above.  She needs this done ASAP due to the care of her husband - Jesse [name].

## 2017-06-19 NOTE — Telephone Encounter (Signed)
Referral to psychiatrist is not needed , he can arrange appt. I believe Dr  Jaynee Eagles is neurologist. We can mail a list of psychiatrist in the area to his address,  Thanks, BJ

## 2017-06-19 NOTE — Telephone Encounter (Signed)
[facility] was called to inquire what paperwork is needed for a referral. A referral order was placed in epic and faxed to ([phone]) [facility].   [name] Ulrich Soules was called and made aware.

## 2017-06-19 NOTE — Telephone Encounter (Signed)
Wife was very upset, she stated that a Neurology referral was needed to see Dr. Melvenia Beam, phone: [phone]. Fax: [phone]. NOT a psychiatric.  I informed pt's spouse that  She requested a psychiatric referral earlier today but that A neuro referral will be ordered. Pt's spouse verbalized understanding.

## 2017-06-19 NOTE — Telephone Encounter (Signed)
Okay for psychiatric consultation of the patient's choice.  Please schedule ASAP

## 2017-06-19 NOTE — Telephone Encounter (Signed)
Sir Is it ok to change to you as a PCP?

## 2017-06-19 NOTE — Telephone Encounter (Signed)
Spoke with wife, patient's wife stated that Dr. Raliegh Ip is her husbands PCP and that she talked with Kathee Delton about the situation and she is taking care of it.

## 2017-06-19 NOTE — Telephone Encounter (Unsigned)
Copied from [place]. Topic: General - Other >> Jun 19, 2017 11:16 AM Neva Seat wrote: Pt wanting all PCP information be shown as Dr. Bluford Kaufmann.  Pt also needing physiatrist referral asap.  See CRM from 06-19-17.

## 2017-06-21 NOTE — Telephone Encounter (Signed)
Noted, thank you

## 2017-06-21 NOTE — Telephone Encounter (Signed)
It is Ok to transferee care to Dr Burnice Logan. Thanks, BJ

## 2017-06-26 ENCOUNTER — Ambulatory Visit (INDEPENDENT_AMBULATORY_CARE_PROVIDER_SITE_OTHER): Payer: Medicare Other | Admitting: Podiatry

## 2017-06-26 ENCOUNTER — Encounter: Payer: Self-pay | Admitting: Podiatry

## 2017-06-26 DIAGNOSIS — B351 Tinea unguium: Secondary | ICD-10-CM

## 2017-06-26 DIAGNOSIS — C61 Malignant neoplasm of prostate: Secondary | ICD-10-CM | POA: Diagnosis not present

## 2017-06-26 DIAGNOSIS — M79676 Pain in unspecified toe(s): Secondary | ICD-10-CM

## 2017-06-26 DIAGNOSIS — L603 Nail dystrophy: Secondary | ICD-10-CM

## 2017-06-26 NOTE — Progress Notes (Signed)
This patient presents the office for an evaluation and treatment of his long painful nails  . Patient states that the nails are painful walking and wearing his shoes.  Patient is presented to the office with his wife.  Patient states he has a history of a hip replacement... He presents the office today for preventative foot care services   General Appearance  Alert, conversant and in no acute stress.  Vascular  Dorsalis pedis and posterior pulses are palpable  bilaterally.  Capillary return is within normal limits  Bilaterally. Temperature is within normal limits  Bilaterally  Neurologic  Senn-Weinstein monofilament wire test within normal limits  bilaterally. Muscle power  Within normal limits bilaterally.  Nails Thick disfigured discolored nails with subungual debride bilaterally from hallux to fifth toes bilaterally. No evidence of bacterial infection or drainage bilaterally.  Orthopedic  No limitations of motion of motion feet bilaterally.  No crepitus or effusions noted.  No bony pathology or digital deformities noted.  Skin  normotropic skin with no porokeratosis noted bilaterally.  No signs of infections or ulcers noted.    Diagnosis  Onychomycosis  B/L   Debridement and grinding of long painful nails.  RTC 9 weeks.   Gardiner Barefoot DPM

## 2017-07-01 ENCOUNTER — Ambulatory Visit (INDEPENDENT_AMBULATORY_CARE_PROVIDER_SITE_OTHER): Payer: Medicare Other | Admitting: Family Medicine

## 2017-07-01 ENCOUNTER — Encounter: Payer: Self-pay | Admitting: Family Medicine

## 2017-07-01 VITALS — BP 80/68 | HR 74 | Temp 98.3°F | Wt 158.0 lb

## 2017-07-01 DIAGNOSIS — J069 Acute upper respiratory infection, unspecified: Secondary | ICD-10-CM | POA: Insufficient documentation

## 2017-07-01 DIAGNOSIS — B9789 Other viral agents as the cause of diseases classified elsewhere: Secondary | ICD-10-CM

## 2017-07-01 MED ORDER — HYDROCODONE-HOMATROPINE 5-1.5 MG/5ML PO SYRP: ORAL_SOLUTION | ORAL | 0 refills | Status: DC

## 2017-07-01 NOTE — Patient Instructions (Signed)
Drink lots of liquids  Tylenol.................. to 3 times daily when necessary  Chloraseptic   Netty pot.........Marland Kitchen 1/8 of a teaspoon of salt/1/8 of a teaspoon of baking soda in warm water. Irrigate your right and left nostrils twice daily till clear  Hydromet...........Marland Kitchen 1/2 teaspoon 2-3 times daily when necessary for cough

## 2017-07-01 NOTE — Progress Notes (Signed)
Jesse [name] is a 77 year old married male nonsmoker who comes in today for evaluation of a cold for 5 days  His symptoms are head congestion scratchy throat cough. No nausea vomiting diarrhea wheezing etc. etc.  Wife has similar symptoms for 3 days  BP (!) 80/68 (BP Location: Left Arm, Patient Position: Sitting, Cuff Size: Normal)   Pulse 74   Temp 98.3 F (36.8 C) (Oral)   Wt 158 lb (71.7 kg)   SpO2 97%   BMI 22.67 kg/m  Well-developed well-nourished male no acute distress HEENT were negative neck was supple no adenopathy lungs are clear  Viral syndrome,,,,,,,,,,,, plan treat symptomatically as outlined

## 2017-07-03 DIAGNOSIS — N5201 Erectile dysfunction due to arterial insufficiency: Secondary | ICD-10-CM | POA: Diagnosis not present

## 2017-07-03 DIAGNOSIS — C61 Malignant neoplasm of prostate: Secondary | ICD-10-CM | POA: Diagnosis not present

## 2017-07-08 ENCOUNTER — Ambulatory Visit (INDEPENDENT_AMBULATORY_CARE_PROVIDER_SITE_OTHER): Payer: Medicare Other | Admitting: Psychology

## 2017-07-08 ENCOUNTER — Ambulatory Visit: Payer: Medicare Other | Admitting: Psychology

## 2017-07-08 DIAGNOSIS — R413 Other amnesia: Secondary | ICD-10-CM

## 2017-07-08 DIAGNOSIS — F329 Major depressive disorder, single episode, unspecified: Secondary | ICD-10-CM

## 2017-07-08 DIAGNOSIS — F32A Depression, unspecified: Secondary | ICD-10-CM

## 2017-07-08 NOTE — Progress Notes (Signed)
NEUROBEHAVIORAL STATUS EXAM   Name: Jesse [name] Date of Birth: 09/22/1940 Date of Interview: 07/08/2017  Reason for Referral:  Jesse [name] is a 77 y.o. male who is referred for neuropsychological evaluation by Jesse Shelling, PhD, of [place] due to concerns about cognitive decline. This patient is accompanied in the office by his wife who provides most of the history.  History of Presenting Problem:  Jesse [name] was originally referred to Jesse [name] from his PCP in order to evaluate and treat depression and anxiety. During his initial appointment with Jesse [name] on 02/25/2017, his wife reported that she has been concerned about his memory in addition to mood/behavioral changes. Jesse [name] administered a cognitive screening tool (RBANS) in order to determine if referral for neuropsychological evaluation was warranted. On the RBANS, Jesse [name] demonstrated extremely low performances on the Visualspatial/Constructional and Delayed Memory indexes. His total score fell within the borderline range. It was noted that he was quite anxious during the testing process. He was referred for full neurocognitive evaluation.  At today's appointment (07/08/2017), the patient's wife explains that in July 2018 the patient demonstrated rather sudden onset of significant change in mood which she felt was depression. Symptoms with onset in July 2018 reportedly included flat expression, decreased interest in activities he used to enjoy, completely stopping doing he things he used to do regularly, "isolating himself", and reduced verbal output/conversation (he has always been quiet but there was a marked change per his wife). Upon direct questioning, the patient does not endorse sad mood or anxiety. He stated, "I got depressed because I wasn't doing too much - but I'm getting out of it now with the pills". His wife states he has been very anxious and will fixate on a worry or perceived problem. For example, he  will fixate on his teeth and needing to get them fixed. He was fixating on being constipated and told his wife he had not had a bowel movement in over a month, which resulted in her taking him to the ED on 06/13/2017. At the ED, he reported that he feels as though he is a huge burden on his wife and this has made him very upset. He denied suicidal ideation. He did complain of constipation but exam showed no fecal impaction and no abdominal tenderness or distension. He was put on a bowel program with Colace daily and since then his wife has also been monitoring all his daily habits closely and charting them. She reports he does have a bowel movement daily.  The patient has a remote history of only one known prior major depressive episode when he was in his 28s. When he first went to college he did not perform well and dropped out, and his mother apparently had him put in a psychiatry unit. His family reported he came out "a brand new person" with resolution of depression.   The patient's PCP tried several times to get him to take an antidepressant but he was initially very resistant, stating he would handle it on his own. However, at some point, he agreed to try Lexapro. He told his wife he was taking it daily but he had actually stopped it after 2 days. His wife found this out about a month later. When she confronted him about it, he told her that it made him too sleepy. The medication was changed to nighttime and his wife took over management of all medications ensuring he was taking it nightly. Lexapro  was increased to 10 mg after his ED visit. His wife felt she noticed improved mood/energy especially in the mornings after this but he is not back to baseline.  Cognitive changes also started in July 2018, per the patient's wife. She reported he was demonstrating reduced comprehension, having trouble understanding what she was saying to him. She also reported forgetfulness and more misplacing/losing items. Upon  direct questioning, the patient endorsed memory problems but could not provide any examples or more explanation of the problems he is having. His wife denied any word finding or language difficulty. He does not drive anymore. His wife manages the finances as she has for most of their marriage. As noted previously, she is also managing his medications now. She manages his appointments as well.   The patient has not had any visual illusions or hallucinations associated with his memory loss. 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   With regard to physical functioning and medical status, the patient's wife notes that a few years ago he demonstrated a change in gait and was evaluated by a neurologist or neurosurgeon who performed surgery for compression of cervical spine. He had another spinal surgery as well. He also had hip replacements. Today I observe that the patient is walking with slow gait and no arm swing on the left, but they report he is not having any difficulty with balance, falls or ambulation. They deny tremors. The patient appears very rigid/tense today but this is likely secondary to anxiety. They deny any sleep difficulty. The patient denies any physical pain. He was treated for prostate cancer with radiation in 2016.  Family history is reportedly significant for dementia in his father in his final years of life.   His wife would like to rule out Alzheimer's disease and see how much psychiatric condition/depression is contributing to perceived memory loss. He is scheduled to see Jesse [name] for neuro evaluation and Jesse Headings, NP, for psychiatry evaluation in the future.   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 Also served in the [employer] for 4 years  after high school. Marital history: Married with four children. Alcohol: Used to enjoy occasional cocktail but since July has not drank any alcohol. Tobacco: Former smoker (smoked very little), has not smoked since 1976   Medical History: Past Medical History:  Diagnosis Date  . Arthritis   . Atrial fibrillation ([place])   . Complication of anesthesia    Atrial fibrillation during surgery   . Hard of hearing   . Hx of constipation   . Osteoporosis    DEXA 11/09/10. Completed treatemtn with Fosamax.  . Prostate cancer ([place])   . Restless legs   . Thyroid disease    hypothyroidism  . Vitiligo      Current Medications:  Outpatient Encounter Medications as of 07/08/2017  Medication Sig  . docusate sodium (COLACE) 100 MG capsule Take 1 capsule (100 mg total) by mouth every 12 (twelve) hours.  Marland Kitchen escitalopram (LEXAPRO) 10 MG tablet Take 1 tablet (10 mg total) by mouth daily.  . Glucosamine HCl-MSM (GLUCOSAMINE-MSM PO) Take 1 tablet by mouth daily.  . Glucosamine-Chondroit-Vit C-Mn (GLUCOSAMINE-CHONDROITIN) TABS Take 1 tablet by mouth daily.  Marland Kitchen HYDROcodone-homatropine (HYCODAN) 5-1.5 MG/5ML syrup 1/2 teaspoon twice daily when necessary for cough and cold  . levothyroxine (  SYNTHROID, LEVOTHROID) 50 MCG tablet Take 1 tablet (50 mcg total) daily before breakfast by mouth.  . Multiple Vitamins-Minerals (MULTIVITAMIN ADULT PO) Take 1 tablet by mouth daily. One a Day  . polyethylene glycol (MIRALAX / GLYCOLAX) packet Take 17 g by mouth daily as needed.   No facility-administered encounter medications on file as of 07/08/2017.      Behavioral Observations:   Appearance: Neatly and appropriately dressed and groomed Gait: Ambulated independently, slow gait, no arm swing on left Speech: Sparse. Lets his wife do most of the talking. Upon direct questioning, he provides brief but fluent responses.  Thought process: Appears linear Affect: Blunted/masked. Tense. Minimal eye contact. Appears very  anxious. Interpersonal: Minimal interaction but is not unpleasant.   60 minutes spent face-to-face with patient completing neurobehavioral status exam. 35 minutes spent integrating medical records/clinical data and completing this report. CPT codes [id number] unit; [id number].   TESTING: There is medical necessity to proceed with neuropsychological assessment as the results will be used to aid in differential diagnosis and clinical decision-making and to inform specific treatment recommendations. Per the patient, his wife and medical records reviewed, there has been a change in cognitive functioning and a reasonable suspicion of dementia versus pseudodementia/depression.  Clinical Decision Making: In considering the patient's current level of functioning, level of presumed impairment, nature of symptoms, emotional and behavioral responses during the interview, level of literacy, and observed level of motivation, a battery of tests was selected and communicated to the psychometrician.   Following the clinical interview/neurobehavioral status exam, the patient completed this full battery of neuropsychological testing with my psychometrician under my supervision (see separate note).   PLAN: The patient will return to see me for a follow-up session at which time his test performances and my impressions and treatment recommendations will be reviewed in detail.  Evaluation ongoing; full report to follow.

## 2017-07-08 NOTE — Progress Notes (Signed)
   Neuropsychology Note  Jesse [name] completed 60 minutes of neuropsychological testing with technician, Milana Kidney, BS, under the supervision of Dr. Macarthur Critchley, Licensed Psychologist. The patient did appear very anxious during the testing session, per behavioral observation or via self-report to the technician. Rest breaks were offered.   Clinical Decision Making: In considering the patient's current level of functioning, level of presumed impairment, nature of symptoms, emotional and behavioral responses during the interview, level of literacy, and observed level of motivation/effort, a battery of tests was selected and communicated to the psychometrician.  Communication between the psychologist and technician was ongoing throughout the testing session and changes were made as deemed necessary based on patient performance on testing, technician observations and additional pertinent factors such as those listed above.  Jesse [name] will return within approximately 2 weeks for an interactive feedback session with Dr. Si Raider at which time his test performances, clinical impressions and treatment recommendations will be reviewed in detail. The patient understands he can contact our office should he require our assistance before this time.  25 minutes spent performing neuropsychological evaluation services/clinical decision making (psychologist). [CPT 34193] 60 minutes spent face-to-face with patient administering standardized tests, 30 minutes spent scoring (technician). [CPT Y8200648, 79024]  Full report to follow.

## 2017-07-09 ENCOUNTER — Encounter: Payer: Self-pay | Admitting: Psychology

## 2017-07-11 ENCOUNTER — Encounter: Payer: Self-pay | Admitting: Psychology

## 2017-07-16 ENCOUNTER — Ambulatory Visit: Payer: Self-pay | Admitting: Family Medicine

## 2017-07-16 ENCOUNTER — Ambulatory Visit: Payer: Self-pay | Admitting: Internal Medicine

## 2017-07-18 ENCOUNTER — Encounter: Payer: Self-pay | Admitting: Cardiovascular Disease

## 2017-07-18 ENCOUNTER — Ambulatory Visit (INDEPENDENT_AMBULATORY_CARE_PROVIDER_SITE_OTHER): Payer: Medicare Other | Admitting: Cardiovascular Disease

## 2017-07-18 VITALS — BP 108/60 | HR 61 | Ht 70.0 in | Wt 161.6 lb

## 2017-07-18 DIAGNOSIS — Z8679 Personal history of other diseases of the circulatory system: Secondary | ICD-10-CM | POA: Diagnosis not present

## 2017-07-18 DIAGNOSIS — F329 Major depressive disorder, single episode, unspecified: Secondary | ICD-10-CM | POA: Diagnosis not present

## 2017-07-18 DIAGNOSIS — E039 Hypothyroidism, unspecified: Secondary | ICD-10-CM | POA: Diagnosis not present

## 2017-07-18 DIAGNOSIS — F32A Depression, unspecified: Secondary | ICD-10-CM

## 2017-07-18 NOTE — Progress Notes (Signed)
Cardiology Consultation Note    Date:  07/21/2017   ID:  Jesse [name], DOB 04/19/1941, MRN [id number]  PCP:  Marletta Lor, MD  Cardiologist:   Sanda Klein, MD  Consultation requested by: Dr. Lyla Glassing Chief Complaint  Patient presents with  . New Patient (Initial Visit)    preop evaluation for history of atrial fibrillation    History of Present Illness:  Jesse [name] is a 77 y.o. male with a remote history of a single episode of paroxysmal atrial fibrillation that occurred in the postoperative setting (cervical laminectomy 2013). He was briefly treated with amiodarone and anticoagulants but since no arrhythmia recurrence was detected he has stopped all cardiac medications. He does not have known structural heart disease.  Is accompanied by his wife.  They did not have any cardiovascular concerns, but his wife is very concerned that he has deteriorating cognitive status.  She is not sure whether this represents depression or the beginnings of dementia.  She does have a previous history of severe depression as a young adult.  He has lost a lot of weight.  He has no interest in any type of activity.  He has been withdrawn since July of last year.  They are planning to see Dr. Jaynee Eagles in the neurology clinic and Mrs. Mcnay has been looking for a psychiatry specialist. He is planning to undergo lumbar spine surgery with Dr. Lyla Glassing and is here for preoperative evaluation. He had another echocardiogram performed on 06/05/2016 showing normal sinus rhythm, minor intraventricular conduction delay with an RSR prime pattern in lead V1, no repolarization abnormalities, normal QT interval.  He has a history of prostate TURP surgery as well as multiple orthopedic procedures. He had radiation therapy for prostate carcinoma. He has treated hypothyroidism, but does not have any other chronic medical problems.  Past Medical History:  Diagnosis Date  . Arthritis   . Atrial fibrillation ([place])     . Complication of anesthesia    Atrial fibrillation during surgery   . Hard of hearing   . Hx of constipation   . Osteoporosis    DEXA 11/09/10. Completed treatemtn with Fosamax.  . Prostate cancer ([place])   . Restless legs   . Thyroid disease    hypothyroidism  . Vitiligo     Past Surgical History:  Procedure Laterality Date  . BACK SURGERY    . COLONOSCOPY    . JOINT REPLACEMENT    . LAMINECTOMY  10/2011   cervical and lumbar laminectomy  . PROSTATE BIOPSY  07/10/2011  . PROSTATE BIOPSY  09/22/15  . TOTAL HIP ARTHROPLASTY Right 02/29/2012  . TOTAL HIP ARTHROPLASTY Right 07/04/2016   Procedure: RIGHT TOTAL HIP ARTHROPLASTY ANTERIOR APPROACH;  Surgeon: Rod Can, MD;  Location: [facility];  Service: Orthopedics;  Laterality: Right;  . TRANSURETHRAL RESECTION OF PROSTATE      Current Medications: Outpatient Medications Prior to Visit  Medication Sig Dispense Refill  . docusate sodium (COLACE) 100 MG capsule Take 1 capsule (100 mg total) by mouth every 12 (twelve) hours. 60 capsule 0  . escitalopram (LEXAPRO) 10 MG tablet Take 1 tablet (10 mg total) by mouth daily. 30 tablet 0  . Glucosamine HCl-MSM (GLUCOSAMINE-MSM PO) Take 1 tablet by mouth daily.    . Glucosamine-Chondroit-Vit C-Mn (GLUCOSAMINE-CHONDROITIN) TABS Take 1 tablet by mouth daily.    Marland Kitchen levothyroxine (SYNTHROID, LEVOTHROID) 50 MCG tablet Take 1 tablet (50 mcg total) daily before breakfast by mouth. 90 tablet 3  . Multiple Vitamins-Minerals (  MULTIVITAMIN ADULT PO) Take 1 tablet by mouth daily. One a Day    . polyethylene glycol (MIRALAX / GLYCOLAX) packet Take 17 g by mouth daily as needed.    Marland Kitchen HYDROcodone-homatropine (HYCODAN) 5-1.5 MG/5ML syrup 1/2 teaspoon twice daily when necessary for cough and cold (Patient not taking: Reported on 07/18/2017) 240 mL 0   No facility-administered medications prior to visit.      Allergies:   Patient has no known allergies.   Social History   Socioeconomic History  . Marital  status: Married    Spouse name: None  . Number of children: None  . Years of education: None  . Highest education level: None  Social Needs  . Financial resource strain: None  . Food insecurity - worry: None  . Food insecurity - inability: None  . Transportation needs - medical: None  . Transportation needs - non-medical: None  Occupational History  . None  Tobacco Use  . Smoking status: Former Smoker    Types: Cigarettes    Last attempt to quit: 05/28/1974    Years since quitting: 43.1  . Smokeless tobacco: Never Used  . Tobacco comment: smoked very little   Substance and Sexual Activity  . Alcohol use: Yes    Comment: 4-5 standard drinks per week/ stopped drinkin   . Drug use: No  . Sexual activity: Yes  Other Topics Concern  . None  Social History Narrative  . None     Family History:  The patient's family history includes Atrial fibrillation in his mother and sister.   ROS:   Please see the history of present illness.    ROS All other systems reviewed and are negative.   PHYSICAL EXAM:   VS:  BP 108/60 (BP Location: Right Arm, Patient Position: Sitting, Cuff Size: Normal)   Pulse 61   Ht 5\' 10"  (1.778 m)   Wt 161 lb 9.6 oz (73.3 kg)   BMI 23.19 kg/m     General: Alert, oriented x3, no distress, lean Head: no evidence of trauma, PERRL, EOMI, no exophtalmos or lid lag, no myxedema, no xanthelasma; normal ears, nose and oropharynx Neck: normal jugular venous pulsations and no hepatojugular reflux; brisk carotid pulses without delay and no carotid bruits Chest: clear to auscultation, no signs of consolidation by percussion or palpation, normal fremitus, symmetrical and full respiratory excursions Cardiovascular: normal position and quality of the apical impulse, regular rhythm, normal first and second heart sounds, no murmurs, rubs or gallops Abdomen: no tenderness or distention, no masses by palpation, no abnormal pulsatility or arterial bruits, normal bowel sounds,  no hepatosplenomegaly Extremities: no clubbing, cyanosis or edema; 2+ radial, ulnar and brachial pulses bilaterally; 2+ right femoral, posterior tibial and dorsalis pedis pulses; 2+ left femoral, posterior tibial and dorsalis pedis pulses; no subclavian or femoral bruits Neurological: grossly nonfocal Psych: Depressed mood, very withdrawn, speaks monosyllabically.   Wt Readings from Last 3 Encounters:  07/18/17 161 lb 9.6 oz (73.3 kg)  07/01/17 158 lb (71.7 kg)  06/03/17 161 lb 9.6 oz (73.3 kg)      Studies/Labs Reviewed:   EKG:  EKG is ordered today.  Shows normal sinus rhythm.  Normal tracing  Recent Labs: 06/13/2017: ALT 15; BUN 22; Creatinine, Ser 0.93; Hemoglobin 13.7; Platelets 205; Potassium 4.4; Sodium 138 06/14/2017: TSH 1.548    ASSESSMENT:    1. Personal history of atrial fibrillation   2. Acquired hypothyroidism   3. Depression, unspecified depression type      PLAN:  In order of problems listed above:  1. Hx of postop atrial fibrillation: Was recorded once in 2013 but has not occurred since, although he has undergone several surgical procedures in the meantime.  Coagulation is not appear to be indicated.  He does not require antiarrhythmic medications. 2. Hypothyroidism: On replacement therapy, TSH in target range. 3. Depression and/or dementia: I agree that he requires expert evaluation.    Medication Adjustments/Labs and Tests Ordered: Current medicines are reviewed at length with the patient today.  Concerns regarding medicines are outlined above.  Medication changes, Labs and Tests ordered today are listed in the Patient Instructions below. Patient Instructions  Dr Sallyanne Kuster recommends that you schedule a follow-up appointment in 12 months. You will receive a reminder letter in the mail two months in advance. If you don't receive a letter, please call our office to schedule the follow-up appointment.  If you need a refill on your cardiac medications before  your next appointment, please call your pharmacy.    Signed, Sanda Klein, MD  07/21/2017 3:25 PM    [address], [place], [place]  [address] Phone: [phone]; Fax: [phone]

## 2017-07-18 NOTE — Patient Instructions (Signed)
Dr Croitoru recommends that you schedule a follow-up appointment in 12 months. You will receive a reminder letter in the mail two months in advance. If you don't receive a letter, please call our office to schedule the follow-up appointment.  If you need a refill on your cardiac medications before your next appointment, please call your pharmacy. 

## 2017-07-22 ENCOUNTER — Encounter: Payer: Self-pay | Admitting: Psychology

## 2017-07-23 DIAGNOSIS — F332 Major depressive disorder, recurrent severe without psychotic features: Secondary | ICD-10-CM | POA: Diagnosis not present

## 2017-08-12 ENCOUNTER — Ambulatory Visit: Payer: Medicare Other | Admitting: Neurology

## 2017-08-21 ENCOUNTER — Ambulatory Visit: Payer: Medicare Other | Admitting: Neurology

## 2017-08-25 IMAGING — CR DG PORTABLE PELVIS
1 series · 1 of 1 positions shown · non-contrast
Comparison: Fluoroscopy earlier today

CLINICAL DATA: Status post right hip replacement

EXAM:
PORTABLE PELVIS 1-2 VIEWS

[AP]
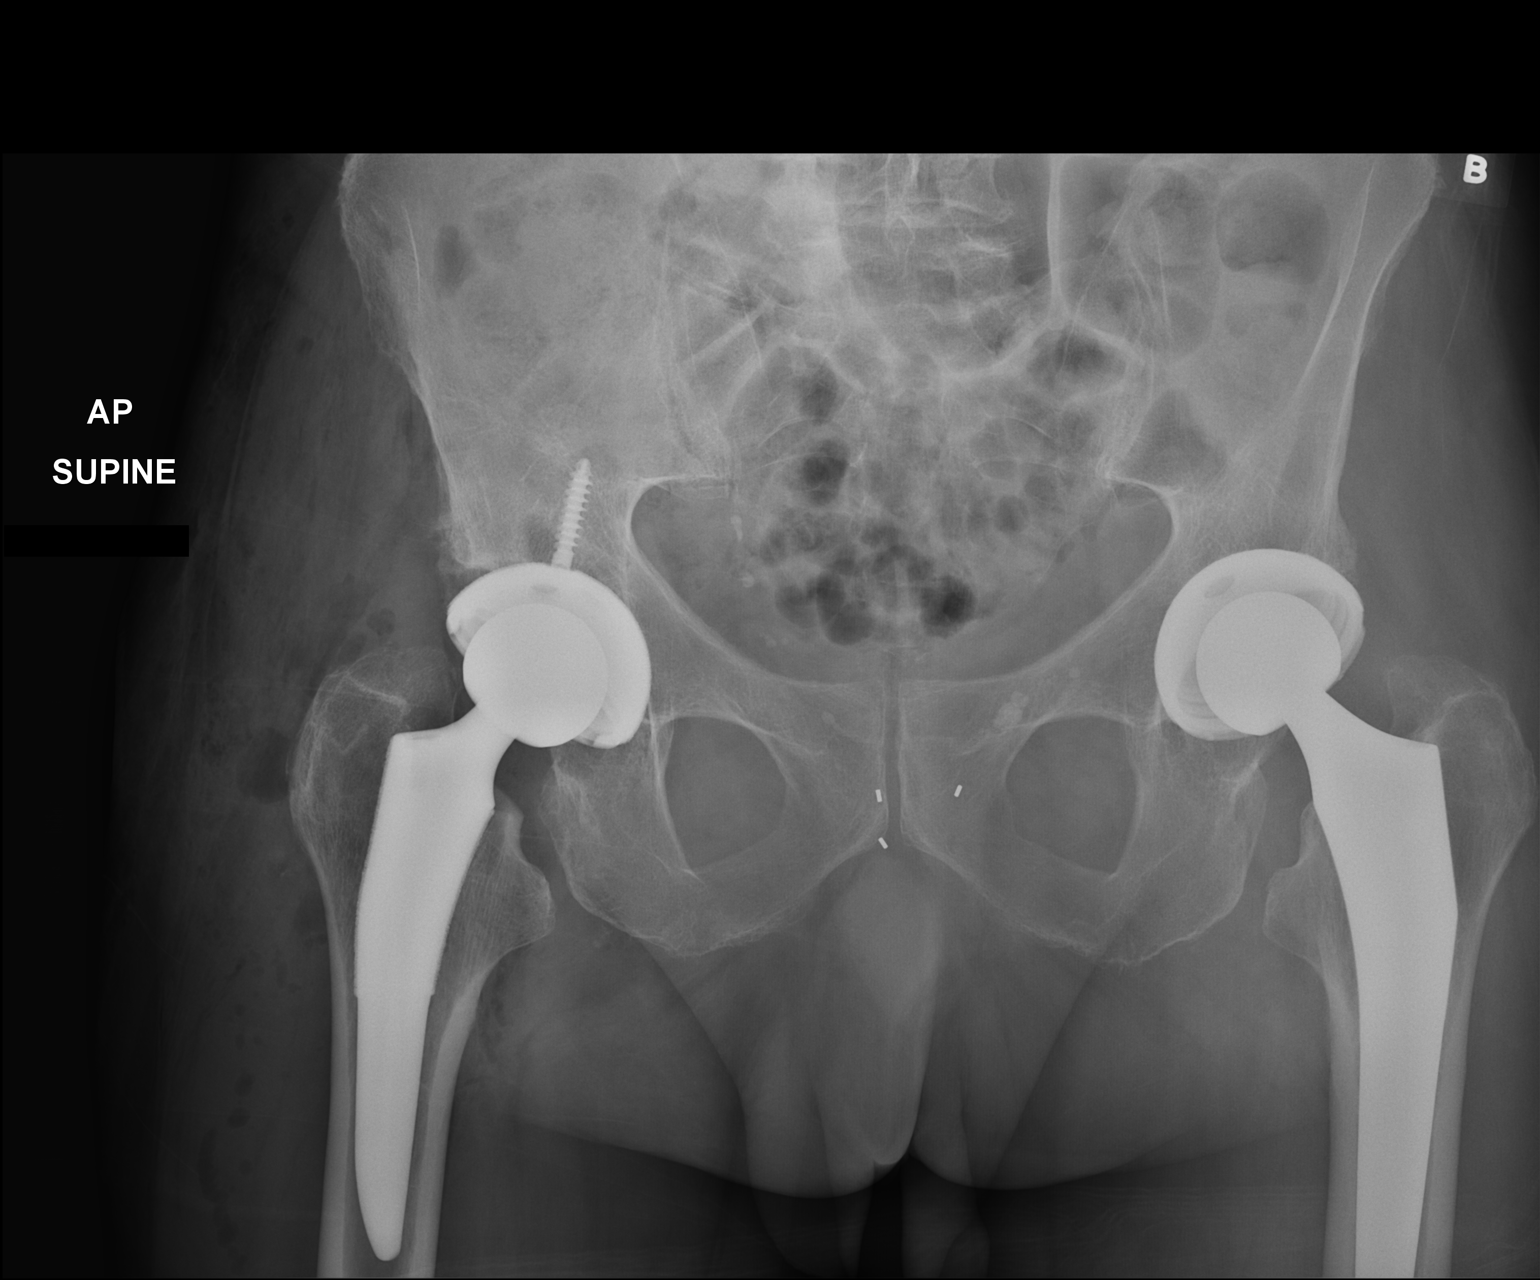

[1 of 1 positions shown; findings below may reference images not displayed]

FINDINGS: Total right hip arthroplasty is located. No evidence of
periprosthetic fracture. Unremarkable visualized portions of total
left hip arthroplasty.

Prostate fiducial markers.
IMPRESSION: No acute finding after total right hip arthroplasty.

## 2017-08-25 NOTE — Progress Notes (Signed)
NEUROPSYCHOLOGICAL EVALUATION   Name:    Jesse [name]  Date of Birth:   1940/09/08 Date of Interview:  07/08/2017 Date of Testing:  07/18/2017  Date of Feedback:  08/27/2017       Background Information:  Reason for Referral:  Jesse [name] is a 77 y.o. male referred by Dr. Irven Shelling of [place] to assess his current level of cognitive functioning and assist in differential diagnosis. The current evaluation consisted of a review of available medical records, an interview with the patient and his wife, and the completion of a neuropsychological testing battery. Informed consent was obtained.  History of Presenting Problem:  Jesse [name] was originally referred to Dr. Glennon Hamilton from his PCP in order to evaluate and treat depression and anxiety. During his initial appointment with Dr. Glennon Hamilton on 02/25/2017, his wife reported that she has been concerned about his memory in addition to mood/behavioral changes. Dr. Glennon Hamilton administered a cognitive screening tool (RBANS) in order to determine if referral for neuropsychological evaluation was warranted. On the RBANS, Jesse [name] demonstrated extremely low performances on the Visualspatial/Constructional and Delayed Memory indexes. His total score fell within the borderline range. It was noted that he was quite anxious during the testing process. He was referred for full neurocognitive evaluation.  At today's appointment (07/08/2017), the patient's wife explains that in July 2018 the patient demonstrated rather sudden onset of significant change in mood which she felt was depression. Symptoms with onset in July 2018 reportedly included flat expression, decreased interest in activities he used to enjoy, completely stopping doing he things he used to do regularly, "isolating himself", and reduced verbal output/conversation (he has always been quiet but there was a marked change per his wife). Upon direct questioning, the patient does not endorse sad  mood or anxiety. He stated, "I got depressed because I wasn't doing too much - but I'm getting out of it now with the pills". His wife states he has been very anxious and will fixate on a worry or perceived problem. For example, he will fixate on his teeth and needing to get them fixed. He was fixating on being constipated and told his wife he had not had a bowel movement in over a month, which resulted in her taking him to the ED on 06/13/2017. At the ED, he reported that he feels as though he is a huge burden on his wife and this has made him very upset. He denied suicidal ideation. He did complain of constipation but exam showed no fecal impaction and no abdominal tenderness or distension. He was put on a bowel program with Colace daily and since then his wife has also been monitoring all his daily habits closely and charting them. She reports he does have a bowel movement daily.  The patient has a remote history of only one known prior major depressive episode when he was in his 31s. When he first went to college he did not perform well and dropped out, and his mother apparently had him put in a psychiatry unit. His family reported he came out "a brand new person" with resolution of depression.   The patient's PCP tried several times to get him to take an antidepressant but he was initially very resistant, stating he would handle it on his own. However, at some point, he agreed to try Lexapro. He told his wife he was taking it daily but he had actually stopped it after 2 days. His wife found this  out about a month later. When she confronted him about it, he told her that it made him too sleepy. The medication was changed to nighttime and his wife took over management of all medications ensuring he was taking it nightly. Lexapro was increased to 10 mg after his ED visit. His wife felt she noticed improved mood/energy especially in the mornings after this but he is not back to baseline.  Cognitive changes  also started in July 2018, per the patient's wife. She reported he was demonstrating reduced comprehension, having trouble understanding what she was saying to him. She also reported forgetfulness and more misplacing/losing items. Upon direct questioning, the patient endorsed memory problems but could not provide any examples or more explanation of the problems he is having. His wife denied any word finding or language difficulty. He does not drive anymore. His wife manages the finances as she has for most of their marriage. As noted previously, she is also managing his medications now. She manages his appointments as well.   The patient has not had any visual illusions or hallucinations associated with his memory loss. 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   With regard to physical functioning and medical status, the patient's wife notes that a few years ago he demonstrated a change in gait and was evaluated by a neurologist or neurosurgeon who performed surgery for compression of cervical spine. He had another spinal surgery as well. He also had hip replacements. Today I observe that the patient is walking with slow gait and no arm swing on the left, but they report he is not having any difficulty with balance, falls or mobility. They deny tremors. The patient appears very rigid/tense today but this may be secondary to anxiety. They deny any sleep difficulty. The patient denies any physical pain. He was treated for prostate cancer with radiation in 2016.  Family history is reportedly significant for dementia in his father in his final years of life.   His wife would like to rule out Alzheimer's disease and see how much an underlying psychiatric condition/depression is contributing to perceived memory loss. He is scheduled to see Dr. Jaynee Eagles for  neurologic evaluation and Jesse Headings, Jesse [name], for psychiatry evaluation in the future.   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 Marital history: Married with four children. Alcohol: Used to enjoy occasional cocktail but since July he has not drank any alcohol. Tobacco: Former smoker (smoked very little), has not smoked since 1976   Medical History:  Past Medical History:  Diagnosis Date  . Arthritis   . Atrial fibrillation ([place])   . Complication of anesthesia    Atrial fibrillation during surgery   . Hard of hearing   . Hx of constipation   . Osteoporosis    DEXA 11/09/10. Completed treatemtn with Fosamax.  . Prostate cancer ([place])   . Restless legs   . Thyroid disease    hypothyroidism  . Vitiligo     Current medications:  Outpatient Encounter Medications as of 08/27/2017  Medication Sig  . docusate sodium (COLACE) 100 MG capsule Take 1 capsule (100 mg total) by mouth every 12 (twelve) hours.  Marland Kitchen escitalopram (LEXAPRO) 10 MG tablet Take 1 tablet (10 mg total) by mouth  daily.  . Glucosamine HCl-MSM (GLUCOSAMINE-MSM PO) Take 1 tablet by mouth daily.  . Glucosamine-Chondroit-Vit C-Mn (GLUCOSAMINE-CHONDROITIN) TABS Take 1 tablet by mouth daily.  Marland Kitchen levothyroxine (SYNTHROID, LEVOTHROID) 50 MCG tablet Take 1 tablet (50 mcg total) daily before breakfast by mouth.  . Multiple Vitamins-Minerals (MULTIVITAMIN ADULT PO) Take 1 tablet by mouth daily. One a Day  . polyethylene glycol (MIRALAX / GLYCOLAX) packet Take 17 g by mouth daily as needed.   No facility-administered encounter medications on file as of 08/27/2017.      Current Examination:  Behavioral Observations:  Appearance: Neatly and appropriately dressed and groomed Gait: Ambulated independently, slow gait, no arm swing on left Speech: Sparse. Lets his wife do most of the talking.  Upon direct questioning, he provides brief but fluent responses.  Thought process: Appears linear Affect: Blunted/masked. Tense. Minimal eye contact. Appears very anxious. Interpersonal: Minimal interaction but is not unpleasant. Orientation: Oriented to all spheres but did not know the current President and inaccurately named his immediate predecessor as "Bush".   Tests Administered: . Test of Premorbid Functioning (TOPF) . Wechsler Adult Intelligence Scale-Fourth Edition (WAIS-IV): Similarities, Music therapist, Coding and Digit Span subtests . Wechsler Memory Scale-Fourth Edition (WMS-IV) Older Adult Version (ages 33-90): Logical Memory I, II and Recognition subtests  . [employer] Verbal Learning Test - 2nd Edition (CVLT-2) Short Form . Repeatable Battery for the Assessment of Neuropsychological Status (RBANS) Form A:  Figure Copy and Recall subtests and Semantic Fluency subtest . Neuropsychological Assessment Battery (NAB) Language Module, Form 1: Naming subtest . Boston Diagnostic Aphasia Examination: Complex Ideational Material subtest . Controlled Oral Word Association Test (COWAT) . Trail Making Test A and B . Clock drawing test . Beck Depression Inventory - 2nd Edition (BDI-II) . Generalized Anxiety Disorder - 7 item screener (GAD-7)  Test Results: Note: Standardized scores are presented only for use by appropriately trained professionals and to allow for any future test-retest comparison. These scores should not be interpreted without consideration of all the information that is contained in the rest of the report. The most recent standardization samples from the test publisher or other sources were used whenever possible to derive standard scores; scores were corrected for age, gender, ethnicity and education when available.   Test Scores:  Test Name Raw Score Standardized Score Descriptor  TOPF 61/70 SS= 119 High average  WAIS-IV Subtests     Similarities 24/36 ss= 11 Average    Block Design 34/66 ss= 12 High average  Coding 34/135 ss= 8 Average  Digit Span Forward 9/16 ss= 9 Average  Digit Span Backward 6/16 ss= 8 Average  WMS-IV Subtests     LM I 16/53 ss= 5 Borderline  LM II 0/39 ss= 1 Severely impaired  LM II Recognition 13/23 Cum %: 3-9 Impaired  RBANS Subtests     Figure Copy 15/20 Z= -1.6 Borderline  Figure Recall 10/20 Z= -0.6 Average  Semantic Fluency 9 Z= -2.1 Impaired  CVLT-II Scores     Trial 1 3/9 Z= -2.5 Impaired  Trial 4 7/9 Z= 0 Average  Trials 1-4 total 21/36 T= 45 Average  SD Free Recall 4/9 Z= -1.5 Borderline  LD Free Recall 5/9 Z= 0 Average  LD Cued Recall 4/9 Z= -0.5 Average  Recognition Hits 9/9 hits Z= 0.5 Average  Recognition False Positives 4 Z= -1.5 Borderline  Recognition Discriminability  Z= 0 Average  Forced Choice Recognition 9/9  WNL  NAB Language subtest     Naming 31/31 T= 59 High average  BDAE Subtest     Complex Ideational Material 12/12  WNL  COWAT-FAS 18 T= 30 Impaired  COWAT-Animals 8 T= 26 Impaired  Trail Making Test A  51" 0 errors T= 48 Average  Trail Making Test B  191" 1 error T= 36 Borderline  Clock Drawing   Impaired  BDI-II 13/63  WNL  GAD-7 4/21  WNL      Description of Test Results:  Premorbid verbal intellectual abilities were estimated to have been within the high average range based on a test of word reading. Psychomotor processing speed was average. Auditory attention and working memory were average. Visual-spatial construction was variable. Specifically, his drawn copy of a complex geometric figure was borderline impaired (due to imprecision of details and omission of one detail), while his construction of three dimensional blocks to match two dimensional stimulus models was high average. Language abilities also were variable. Specifically, confrontation naming was high average, while semantic verbal fluency was impaired. Auditory comprehension of complex ideational material was intact. With  regard to verbal memory, encoding and acquisition of non-contextual information (i.e., word list) was average across four learning trials. After a brief distracter task, free recall was borderline (4/9 items recalled). After a delay, free recall was average (5/9 items). He did not benefit from semantic cueing. Performance on a yes/no recognition task was average overall although he did commit elevated number of false positive errors. On another verbal memory test, encoding and acquisition of contextual auditory information (i.e., short stories) was borderline impaired. After a delay, free recall was severely impaired (did not recall any aspects of either of the original stories). Performance on a yes/no recognition task was impaired. With regard to non-verbal memory, delayed free recall of visual information was average. Performances across tasks measuring various aspects of executive functioning ranged from impaired to average. Mental flexibility and set-shifting were borderline impaired on Trails B. Verbal fluency with phonemic search restrictions was impaired. Verbal abstract reasoning was average. Performance on a clock drawing task was impaired due to incorrect time placement. On a self-report measure of mood, the patient's responses produced a score that was just below the cut-off for clinically significant depression. Symptoms endorsed included: sadness much of the time, anhedonia, pessimism, loss of self confidence, self-criticalness, loss of interest, indecisiveness, loss of energy, increased sleep, reduced appetite, concentration difficulty, fatigue and reduced libido. He denied suicidal ideation or intention. On a self-report measure of anxiety, the patient did not endorse a level of difficulty consistent with clinically significant generalized anxiety at the present time, but he did endorse some symptoms of generalized anxiety including nervousness, inability to stop or control worrying, excessive  worries, and fear of something awful happening. He endorsed these symptoms as occurring several days over the past 2 weeks.   Clinical Impressions: Mild dementia, unspecified (needs MRI to assist with differential diagnosis).  Results of formal testing revealed deficits in several domains of cognitive functioning. Additionally, there is evidence that his cognitive deficits are interfering with his ability to manage daily tasks (eg driving, medications, appointments). As such, diagnostic criteria for a dementia syndrome are met. Based on test results I would characterize this as mild stage dementia. Cognitive testing revealed deficits in verbal fluency, learning and memory (for short stories in particular), and some aspects of executive functioning (mental flexibility/set-shifting, clock drawing). This cognitive profile can be seen in Alzheimer's disease, but this does not match with the reported acute onset of cognitive changes. Therefore, further workup is indicated, including neuroimaging and neurology  consultation. Pseudodementia (due to mood disorder) was considered given that onset of cognitive problems reportedly coincided with abrupt change in mood/depression, but his test results favor organic impairment due to the nature of strengths and weaknesses seen, and the fact that despite reported mood improvement with Lexapro, he continues to present with significant cognitive deficits. On self report screening measures, he is endorsing some ongoing symptoms of depression and anxiety but not at a clinically significant level; however, I suspect this may have been under-reported by the patient, based on his presentation and behavior observed by this examiner.     Recommendations/Plan: Based on the findings of the present evaluation, the following recommendations are offered:  1. Given the abrupt onset of cognitive change and mood change in July 2018 with no known precipitating factors, brain MRI is highly  recommended to assist with differential diagnosis. He is scheduled to see Dr. Jaynee Eagles for neurologic consultation on 10/15/2017. His PCP may want to have MRI completed prior to this visit so that Dr. Jaynee Eagles will have it available for her review. 2. I agree that the patient should not drive (he has already stopped driving) and that his wife should manage finances, appointments and medications.  3. It is recommended that he have daily routine of activities incorporating as much structure as possible so as to enhance engagement in daily life and rely less on internal motivation/memory abilities. 4. He will likely require external memory aids (eg reminders, calendar) to assist in helping him remember information. He seems to do better with visual than auditory memory. He also seems to do much better when information is repeated to him several times. 5. Mood should continue to be monitored. He has started seeing a psychiatry provider and lexapro is being changed to Cymbalta. I doubt that he would benefit from counseling/talk therapy.  6. I would like to see the patient back in one year for repeat neuropsychological evaluation in order to monitor cognitive function, track any progression of symptoms and further assist with treatment recommendations.    Feedback to Patient: Jesse [name] and his wife returned for a feedback appointment on 08/27/2017 to review the results of his neuropsychological evaluation with this provider. 30 minutes face-to-face time was spent reviewing his test results, my impressions and my recommendations as detailed above.    Total time spent on this patient's case: 95 minutes for neurobehavioral status exam with psychologist (CPT code [phone], [phone]); 90 minutes of testing/scoring by psychometrician under psychologist's supervision (CPT codes [phone], [phone] units); 180 minutes for integration of patient data, interpretation of standardized test results and clinical data, clinical decision  making, treatment planning and preparation of this report, and interactive feedback with review of results to the patient/family by psychologist (CPT codes [phone], [phone] units).      Thank you for your referral of Jesse [name]. Please feel free to contact me if you have any questions or concerns regarding this report.

## 2017-08-27 ENCOUNTER — Encounter: Payer: Self-pay | Admitting: Internal Medicine

## 2017-08-27 ENCOUNTER — Ambulatory Visit (INDEPENDENT_AMBULATORY_CARE_PROVIDER_SITE_OTHER): Payer: Medicare Other | Admitting: Psychology

## 2017-08-27 ENCOUNTER — Other Ambulatory Visit: Payer: Self-pay

## 2017-08-27 ENCOUNTER — Encounter: Payer: Self-pay | Admitting: Psychology

## 2017-08-27 ENCOUNTER — Ambulatory Visit (INDEPENDENT_AMBULATORY_CARE_PROVIDER_SITE_OTHER): Payer: Medicare Other | Admitting: Internal Medicine

## 2017-08-27 VITALS — BP 108/70 | HR 56 | Wt 161.0 lb

## 2017-08-27 DIAGNOSIS — H1132 Conjunctival hemorrhage, left eye: Secondary | ICD-10-CM | POA: Diagnosis not present

## 2017-08-27 DIAGNOSIS — F0390 Unspecified dementia without behavioral disturbance: Secondary | ICD-10-CM | POA: Diagnosis not present

## 2017-08-27 DIAGNOSIS — R4189 Other symptoms and signs involving cognitive functions and awareness: Secondary | ICD-10-CM | POA: Diagnosis not present

## 2017-08-27 DIAGNOSIS — F322 Major depressive disorder, single episode, severe without psychotic features: Secondary | ICD-10-CM

## 2017-08-27 NOTE — Progress Notes (Signed)
Subjective:    Patient ID: Jesse [name], male    DOB: 1941/04/26, 77 y.o.   MRN: [id number]  HPI  77 year old patient who has a history of depression.  He had a  neuropsychological evaluation earlier today due to cognitive impairment. His wife states that in July 2018, he had significant mood changes with depression.  Due to the fairly abrupt onset of cognitive change as well as mood changes in July of last year without obvious precipitating factors, a brain MRI was suggested to assist with the differential diagnosis.  The patient and his wife presented to the office today hoping to get the MRI scheduled. The patient was seen today as a work and after his wife complained of "bleeding from the eyes" For the past couple weeks the patient has had 3 separate episodes of what appears to be mild  conjunctival bleeding.  There is been no visual loss discomfort.  The patient does feel that at times he may be rubbing his eyes due to slight irritation  Results of neuropsychological evaluation reviewed  Past Medical History:  Diagnosis Date  . Arthritis   . Atrial fibrillation ([place])   . Complication of anesthesia    Atrial fibrillation during surgery   . Hard of hearing   . Hx of constipation   . Osteoporosis    DEXA 11/09/10. Completed treatemtn with Fosamax.  . Prostate cancer ([place])   . Restless legs   . Thyroid disease    hypothyroidism  . Vitiligo      Social History   Socioeconomic History  . Marital status: Married    Spouse name: Not on file  . Number of children: Not on file  . Years of education: Not on file  . Highest education level: Not on file  Occupational History  . Not on file  Social Needs  . Financial resource strain: Not on file  . Food insecurity:    Worry: Not on file    Inability: Not on file  . Transportation needs:    Medical: Not on file    Non-medical: Not on file  Tobacco Use  . Smoking status: Former Smoker    Types: Cigarettes    Last attempt to  quit: 05/28/1974    Years since quitting: 43.2  . Smokeless tobacco: Never Used  . Tobacco comment: smoked very little   Substance and Sexual Activity  . Alcohol use: Yes    Comment: 4-5 standard drinks per week/ stopped drinkin   . Drug use: No  . Sexual activity: Yes  Lifestyle  . Physical activity:    Days per week: Not on file    Minutes per session: Not on file  . Stress: Not on file  Relationships  . Social connections:    Talks on phone: Not on file    Gets together: Not on file    Attends religious service: Not on file    Active member of club or organization: Not on file    Attends meetings of clubs or organizations: Not on file    Relationship status: Not on file  . Intimate partner violence:    Fear of current or ex partner: Not on file    Emotionally abused: Not on file    Physically abused: Not on file    Forced sexual activity: Not on file  Other Topics Concern  . Not on file  Social History Narrative  . Not on file    Past Surgical History:  Procedure  Laterality Date  . BACK SURGERY    . COLONOSCOPY    . JOINT REPLACEMENT    . LAMINECTOMY  10/2011   cervical and lumbar laminectomy  . PROSTATE BIOPSY  07/10/2011  . PROSTATE BIOPSY  09/22/15  . TOTAL HIP ARTHROPLASTY Right 02/29/2012  . TOTAL HIP ARTHROPLASTY Right 07/04/2016   Procedure: RIGHT TOTAL HIP ARTHROPLASTY ANTERIOR APPROACH;  Surgeon: Rod Can, MD;  Location: [facility];  Service: Orthopedics;  Laterality: Right;  . TRANSURETHRAL RESECTION OF PROSTATE      Family History  Problem Relation Age of Onset  . Atrial fibrillation Mother   . Atrial fibrillation Sister   . Cancer Neg Hx     No Known Allergies  Current Outpatient Medications on File Prior to Visit  Medication Sig Dispense Refill  . docusate sodium (COLACE) 100 MG capsule Take 1 capsule (100 mg total) by mouth every 12 (twelve) hours. (Patient taking differently: Take 100 mg by mouth daily. ) 60 capsule 0  . DULoxetine (CYMBALTA)  30 MG capsule Take 30 mg by mouth daily.    Marland Kitchen escitalopram (LEXAPRO) 5 MG tablet Take 5 mg by mouth daily.    . Glucosamine HCl-MSM (GLUCOSAMINE-MSM PO) Take 1 tablet by mouth daily.    . Glucosamine-Chondroit-Vit C-Mn (GLUCOSAMINE-CHONDROITIN) TABS Take 1 tablet by mouth daily.    Marland Kitchen levothyroxine (SYNTHROID, LEVOTHROID) 50 MCG tablet Take 1 tablet (50 mcg total) daily before breakfast by mouth. 90 tablet 3  . Multiple Vitamins-Minerals (MULTIVITAMIN ADULT PO) Take 1 tablet by mouth daily. One a Day    . polyethylene glycol (MIRALAX / GLYCOLAX) packet Take 17 g by mouth daily as needed.     No current facility-administered medications on file prior to visit.     BP 108/70 (BP Location: Right Arm, Patient Position: Sitting, Cuff Size: Normal)   Pulse (!) 56   Wt 161 lb (73 kg)   SpO2 96%   BMI 23.10 kg/m       Review of Systems  Constitutional: Negative for appetite change, chills, fatigue and fever.  HENT: Negative for congestion, dental problem, ear pain, hearing loss, sore throat, tinnitus, trouble swallowing and voice change.   Eyes: Positive for redness. Negative for pain, discharge and visual disturbance.  Respiratory: Negative for cough, chest tightness, wheezing and stridor.   Cardiovascular: Negative for chest pain, palpitations and leg swelling.  Gastrointestinal: Negative for abdominal distention, abdominal pain, blood in stool, constipation, diarrhea, nausea and vomiting.  Genitourinary: Negative for difficulty urinating, discharge, flank pain, genital sores, hematuria and urgency.  Musculoskeletal: Negative for arthralgias, back pain, gait problem, joint swelling, myalgias and neck stiffness.  Skin: Negative for rash.  Neurological: Negative for dizziness, syncope, speech difficulty, weakness, numbness and headaches.  Hematological: Negative for adenopathy. Does not bruise/bleed easily.  Psychiatric/Behavioral: Positive for behavioral problems and dysphoric mood. The  patient is not nervous/anxious.        Objective:   Physical Exam  Constitutional: He appears well-developed and well-nourished. No distress.  Blood pressure low normal  Eyes:  Mild sub-conjunctival bleeding involving the left inner lower eye          Assessment & Plan:  Mild sub-conjunctival hemorrhage left eye. Patient and family reassured  History of cognitive and mood change since July 2018.  We will schedule brain MRI  Neurology consultation as scheduled  Nyoka Cowden

## 2017-08-27 NOTE — Patient Instructions (Signed)
Neurology follow-up as scheduled  Brain MRI as discussed  Return in 3 months for follow-up

## 2017-08-27 NOTE — Patient Instructions (Addendum)
Description of Test Results:  Premorbid verbal intellectual abilities were estimated to have been within the high average range based on a test of word reading. Psychomotor processing speed was average. Auditory attention and working memory were average. Visual-spatial construction was variable. Specifically, his drawn copy of a complex geometric figure was borderline impaired (due to imprecision of details and omission of one detail), while his construction of three dimensional blocks to match two dimensional stimulus models was high average. Language abilities also were variable. Specifically, confrontation naming was high average, while semantic verbal fluency was impaired. Auditory comprehension of complex ideational material was intact. With regard to verbal memory, encoding and acquisition of non-contextual information (i.e., word list) was average across four learning trials. After a brief distracter task, free recall was borderline (4/9 items recalled). After a delay, free recall was average (5/9 items). He did not benefit from semantic cueing. Performance on a yes/no recognition task was average overall although he did commit elevated number of false positive errors. On another verbal memory test, encoding and acquisition of contextual auditory information (i.e., short stories) was borderline impaired. After a delay, free recall was severely impaired (did not recall any aspects of either of the original stories). Performance on a yes/no recognition task was impaired. With regard to non-verbal memory, delayed free recall of visual information was average. Performances across tasks measuring various aspects of executive functioning ranged from impaired to average. Mental flexibility and set-shifting were borderline impaired on [place]. Verbal fluency with phonemic search restrictions was impaired. Verbal abstract reasoning was average. Performance on a clock drawing task was impaired due to incorrect time  placement. On a self-report measure of mood, the patient's responses produced a score that was just below the cut-off for clinically significant depression. Symptoms endorsed included: sadness much of the time, anhedonia, pessimism, loss of self confidence, self-criticalness, loss of interest, indecisiveness, loss of energy, increased sleep, reduced appetite, concentration difficulty, fatigue and reduced libido. He denied suicidal ideation or intention. On a self-report measure of anxiety, the patient did not endorse a level of difficulty consistent with clinically significant generalized anxiety at the present time, but he did endorse some symptoms of generalized anxiety including nervousness, inability to stop or control worrying, excessive worries, and fear of something awful happening. He endorsed these symptoms as occurring several days over the past 2 weeks.   Clinical Impressions: Mild dementia, unspecified (needs MRI to assist with differential diagnosis).  Results of formal testing revealed deficits in several domains of cognitive functioning. Additionally, there is evidence that his cognitive deficits are interfering with his ability to manage daily tasks (eg driving, medications, appointments). As such, diagnostic criteria for a dementia syndrome are met. Based on test results I would characterize this as mild stage dementia. Cognitive testing revealed deficits in verbal fluency, learning and memory (for short stories in particular), and some aspects of executive functioning (mental flexibility/set-shifting, clock drawing). This cognitive profile can be seen in Alzheimer's disease, but this does not match with the reported acute onset of cognitive changes. Therefore, further workup is indicated, including neuroimaging and neurology consultation. Pseudodementia (due to mood disorder) was considered given that onset of cognitive problems reportedly coincided with abrupt change in mood/depression, but  his test results favor organic impairment due to the nature of strengths and weaknesses seen, and the fact that despite reported mood improvement with Lexapro, he continues to present with significant cognitive deficits. On self report screening measures, he is endorsing some ongoing symptoms of depression and  anxiety but not at a clinically significant level.     Recommendations/Plan: Based on the findings of the present evaluation, the following recommendations are offered:  1. Given the abrupt onset of cognitive change and mood change in July 2018 with no known precipitating factors, brain MRI is highly recommended to assist with differential diagnosis. He is scheduled to see Dr. Jaynee Eagles for neurologic consultation on 10/15/2017. His PCP may want to have MRI completed prior to this visit so that Dr. Jaynee Eagles will have it available for her review. 2. I agree that the patient should not drive (he has already stopped driving) and that his wife should manage finances, appointments and medications.  3. It is recommended that he have daily routine of activities incorporating as much structure as possible so as to enhance engagement in daily life and rely less on internal motivation/memory abilities. 4. He will likely require external memory aids (eg reminders, calendar) to assist in helping him remember information. He seems to do better with visual than auditory memory. He also seems to do much better when information is repeated to him several times. 5. Mood should continue to be monitored. He is going to see a psychiatry provider. It seems that Lexapro is helping to some extent. I am not sure that he would benefit from counseling/talk therapy.  6. I would like to see the patient back in one year for repeat neuropsychological evaluation in order to monitor cognitive function, track any progression of symptoms and further assist with treatment recommendations.

## 2017-08-28 ENCOUNTER — Encounter: Payer: Self-pay | Admitting: Podiatry

## 2017-08-28 ENCOUNTER — Ambulatory Visit (INDEPENDENT_AMBULATORY_CARE_PROVIDER_SITE_OTHER): Payer: Medicare Other | Admitting: Podiatry

## 2017-08-28 DIAGNOSIS — B351 Tinea unguium: Secondary | ICD-10-CM

## 2017-08-28 DIAGNOSIS — M79676 Pain in unspecified toe(s): Secondary | ICD-10-CM | POA: Diagnosis not present

## 2017-08-28 NOTE — Progress Notes (Signed)
This patient presents the office for an evaluation and treatment of his long painful nails  . Patient states that the nails are painful walking and wearing his shoes.  Patient is presented to the office with his wife.  . He presents the office today for preventative foot care services   General Appearance  Alert, conversant and in no acute stress.  Vascular  Dorsalis pedis and posterior pulses are palpable  bilaterally.  Capillary return is within normal limits  Bilaterally. Temperature is within normal limits  Bilaterally  Neurologic  Senn-Weinstein monofilament wire test within normal limits  bilaterally. Muscle power  Within normal limits bilaterally.  Nails Thick disfigured discolored nails with subungual debride bilaterally from hallux to fifth toes bilaterally. No evidence of bacterial infection or drainage bilaterally.  Orthopedic  No limitations of motion of motion feet bilaterally.  No crepitus or effusions noted.  No bony pathology or digital deformities noted.  Skin  normotropic skin with no porokeratosis noted bilaterally.  No signs of infections or ulcers noted.    Diagnosis  Onychomycosis  B/L   Debridement and grinding of long painful nails.  RTC 9 weeks.   Damilola Flamm DPM 

## 2017-09-02 ENCOUNTER — Telehealth: Payer: Self-pay | Admitting: Family Medicine

## 2017-09-02 ENCOUNTER — Encounter: Payer: Self-pay | Admitting: Internal Medicine

## 2017-09-02 NOTE — Telephone Encounter (Signed)
Copied from [place] [phone]. Topic: Referral - Status >> Sep 02, 2017 11:31 AM Conception Chancy, NT wrote: Reason for CRM: patient wife is calling and states that patient has not heard from anyone about scheduling MRI. Please contact wife with status.

## 2017-09-04 ENCOUNTER — Ambulatory Visit
Admission: RE | Admit: 2017-09-04 | Discharge: 2017-09-04 | Disposition: A | Discharge status: Home / Self Care | Payer: Medicare Other | Source: Ambulatory Visit | Attending: Internal Medicine | Admitting: Internal Medicine

## 2017-09-04 DIAGNOSIS — R4189 Other symptoms and signs involving cognitive functions and awareness: Secondary | ICD-10-CM

## 2017-09-04 DIAGNOSIS — R4020 Unspecified coma: Secondary | ICD-10-CM | POA: Diagnosis not present

## 2017-09-04 NOTE — Telephone Encounter (Signed)
Patient wife states that the MRI was done this am at 6am. No further action needed.

## 2017-09-09 DIAGNOSIS — D320 Benign neoplasm of cerebral meninges: Secondary | ICD-10-CM | POA: Diagnosis not present

## 2017-09-09 DIAGNOSIS — F0390 Unspecified dementia without behavioral disturbance: Secondary | ICD-10-CM | POA: Diagnosis not present

## 2017-09-09 DIAGNOSIS — Z8546 Personal history of malignant neoplasm of prostate: Secondary | ICD-10-CM | POA: Diagnosis not present

## 2017-09-09 DIAGNOSIS — F329 Major depressive disorder, single episode, unspecified: Secondary | ICD-10-CM | POA: Diagnosis not present

## 2017-09-10 DIAGNOSIS — D320 Benign neoplasm of cerebral meninges: Secondary | ICD-10-CM | POA: Diagnosis not present

## 2017-09-27 ENCOUNTER — Encounter (HOSPITAL_COMMUNITY): Payer: Self-pay | Admitting: *Deleted

## 2017-09-27 ENCOUNTER — Encounter (HOSPITAL_COMMUNITY)
Admission: EM | Disposition: A | Discharge status: Home / Self Care | Payer: Self-pay | Source: Home / Self Care | Attending: Family Medicine

## 2017-09-27 ENCOUNTER — Other Ambulatory Visit: Payer: Self-pay

## 2017-09-27 ENCOUNTER — Inpatient Hospital Stay (HOSPITAL_COMMUNITY): Payer: Medicare Other

## 2017-09-27 ENCOUNTER — Inpatient Hospital Stay (HOSPITAL_COMMUNITY)
Admission: EM | Admit: 2017-09-27 | Discharge: 2017-10-01 | DRG: 345 | Disposition: A | Discharge status: Home / Self Care | Payer: Medicare Other | Attending: Family Medicine | Admitting: Family Medicine

## 2017-09-27 ENCOUNTER — Emergency Department (HOSPITAL_COMMUNITY): Payer: Medicare Other

## 2017-09-27 DIAGNOSIS — E890 Postprocedural hypothyroidism: Secondary | ICD-10-CM | POA: Diagnosis present

## 2017-09-27 DIAGNOSIS — R918 Other nonspecific abnormal finding of lung field: Secondary | ICD-10-CM

## 2017-09-27 DIAGNOSIS — K562 Volvulus: Secondary | ICD-10-CM

## 2017-09-27 DIAGNOSIS — Z9079 Acquired absence of other genital organ(s): Secondary | ICD-10-CM | POA: Diagnosis not present

## 2017-09-27 DIAGNOSIS — K559 Vascular disorder of intestine, unspecified: Secondary | ICD-10-CM | POA: Diagnosis present

## 2017-09-27 DIAGNOSIS — F0390 Unspecified dementia without behavioral disturbance: Secondary | ICD-10-CM | POA: Diagnosis present

## 2017-09-27 DIAGNOSIS — Z4682 Encounter for fitting and adjustment of non-vascular catheter: Secondary | ICD-10-CM | POA: Diagnosis not present

## 2017-09-27 DIAGNOSIS — I4891 Unspecified atrial fibrillation: Secondary | ICD-10-CM | POA: Diagnosis present

## 2017-09-27 DIAGNOSIS — Z96641 Presence of right artificial hip joint: Secondary | ICD-10-CM | POA: Diagnosis present

## 2017-09-27 DIAGNOSIS — H9190 Unspecified hearing loss, unspecified ear: Secondary | ICD-10-CM | POA: Diagnosis present

## 2017-09-27 DIAGNOSIS — M810 Age-related osteoporosis without current pathological fracture: Secondary | ICD-10-CM | POA: Diagnosis present

## 2017-09-27 DIAGNOSIS — E039 Hypothyroidism, unspecified: Secondary | ICD-10-CM | POA: Diagnosis present

## 2017-09-27 DIAGNOSIS — M1990 Unspecified osteoarthritis, unspecified site: Secondary | ICD-10-CM | POA: Diagnosis present

## 2017-09-27 DIAGNOSIS — E440 Moderate protein-calorie malnutrition: Secondary | ICD-10-CM

## 2017-09-27 DIAGNOSIS — K5909 Other constipation: Secondary | ICD-10-CM | POA: Diagnosis not present

## 2017-09-27 DIAGNOSIS — Z6821 Body mass index (BMI) 21.0-21.9, adult: Secondary | ICD-10-CM | POA: Diagnosis not present

## 2017-09-27 DIAGNOSIS — R140 Abdominal distension (gaseous): Secondary | ICD-10-CM | POA: Diagnosis not present

## 2017-09-27 DIAGNOSIS — M1611 Unilateral primary osteoarthritis, right hip: Secondary | ICD-10-CM | POA: Diagnosis present

## 2017-09-27 DIAGNOSIS — D62 Acute posthemorrhagic anemia: Secondary | ICD-10-CM | POA: Diagnosis present

## 2017-09-27 DIAGNOSIS — Z87891 Personal history of nicotine dependence: Secondary | ICD-10-CM | POA: Diagnosis not present

## 2017-09-27 DIAGNOSIS — Z79890 Hormone replacement therapy (postmenopausal): Secondary | ICD-10-CM | POA: Diagnosis not present

## 2017-09-27 DIAGNOSIS — F322 Major depressive disorder, single episode, severe without psychotic features: Secondary | ICD-10-CM | POA: Diagnosis present

## 2017-09-27 DIAGNOSIS — L80 Vitiligo: Secondary | ICD-10-CM | POA: Diagnosis present

## 2017-09-27 DIAGNOSIS — K567 Ileus, unspecified: Secondary | ICD-10-CM | POA: Diagnosis not present

## 2017-09-27 DIAGNOSIS — G2581 Restless legs syndrome: Secondary | ICD-10-CM | POA: Diagnosis present

## 2017-09-27 DIAGNOSIS — Z8546 Personal history of malignant neoplasm of prostate: Secondary | ICD-10-CM

## 2017-09-27 DIAGNOSIS — F03A0 Unspecified dementia, mild, without behavioral disturbance, psychotic disturbance, mood disturbance, and anxiety: Secondary | ICD-10-CM | POA: Diagnosis present

## 2017-09-27 DIAGNOSIS — J90 Pleural effusion, not elsewhere classified: Secondary | ICD-10-CM | POA: Diagnosis not present

## 2017-09-27 DIAGNOSIS — I480 Paroxysmal atrial fibrillation: Secondary | ICD-10-CM | POA: Diagnosis present

## 2017-09-27 DIAGNOSIS — R1084 Generalized abdominal pain: Secondary | ICD-10-CM | POA: Diagnosis not present

## 2017-09-27 HISTORY — DX: Unspecified dementia, unspecified severity, without behavioral disturbance, psychotic disturbance, mood disturbance, and anxiety: F03.90

## 2017-09-27 HISTORY — DX: Depression, unspecified: F32.A

## 2017-09-27 HISTORY — PX: FLEXIBLE SIGMOIDOSCOPY: SHX5431

## 2017-09-27 HISTORY — DX: Major depressive disorder, single episode, unspecified: F32.9

## 2017-09-27 HISTORY — DX: Volvulus: K56.2

## 2017-09-27 LAB — COMPREHENSIVE METABOLIC PANEL
ALT: 23 U/L (ref 17–63)
AST: 29 U/L (ref 15–41)
Albumin: 4.1 g/dL (ref 3.5–5.0)
Alkaline Phosphatase: 70 U/L (ref 38–126)
Anion gap: 13 (ref 5–15)
BUN: 26 mg/dL — ABNORMAL HIGH (ref 6–20)
CO2: 22 mmol/L (ref 22–32)
Calcium: 9.1 mg/dL (ref 8.9–10.3)
Chloride: 99 mmol/L — ABNORMAL LOW (ref 101–111)
Creatinine, Ser: 0.96 mg/dL (ref 0.61–1.24)
GFR calc Af Amer: >60 mL/min (ref >60)
GFR calc non Af Amer: >60 mL/min (ref >60)
Glucose, Bld: 178 mg/dL — ABNORMAL HIGH (ref 65–99)
Potassium: 4.4 mmol/L (ref 3.5–5.1)
Sodium: 134 mmol/L — ABNORMAL LOW (ref 135–145)
Total Bilirubin: 1.2 mg/dL (ref 0.3–1.2)
Total Protein: 6.9 g/dL (ref 6.5–8.1)

## 2017-09-27 LAB — CBC
HCT: 46.5 % (ref 39.0–52.0)
HCT: 44.1 % (ref 39.0–52.0)
Hemoglobin: 16.3 g/dL (ref 13.0–17.0)
Hemoglobin: 15.1 g/dL (ref 13.0–17.0)
MCH: 30.4 pg (ref 26.0–34.0)
MCH: 29.6 pg (ref 26.0–34.0)
MCHC: 35.1 g/dL (ref 30.0–36.0)
MCHC: 34.2 g/dL (ref 30.0–36.0)
MCV: 86.8 fL (ref 78.0–100.0)
MCV: 86.5 fL (ref 78.0–100.0)
Platelets: 233 10*3/uL (ref 150–400)
Platelets: 196 10*3/uL (ref 150–400)
RBC: 5.36 MIL/uL (ref 4.22–5.81)
RBC: 5.1 MIL/uL (ref 4.22–5.81)
RDW: 13.8 % (ref 11.5–15.5)
RDW: 13.5 % (ref 11.5–15.5)
WBC: 11.2 10*3/uL — ABNORMAL HIGH (ref 4.0–10.5)
WBC: 11.7 10*3/uL — ABNORMAL HIGH (ref 4.0–10.5)

## 2017-09-27 LAB — URINALYSIS, ROUTINE W REFLEX MICROSCOPIC
Bacteria, UA: NONE SEEN
Bilirubin Urine: NEGATIVE
Glucose, UA: 150 mg/dL — AB
Hgb urine dipstick: NEGATIVE
Ketones, ur: 20 mg/dL — AB
Leukocytes, UA: NEGATIVE
Nitrite: NEGATIVE
Protein, ur: 30 mg/dL — AB
Specific Gravity, Urine: 1.028 (ref 1.005–1.030)
pH: 5 (ref 5.0–8.0)

## 2017-09-27 LAB — CREATININE, SERUM
Creatinine, Ser: 0.95 mg/dL (ref 0.61–1.24)
GFR calc Af Amer: >60 mL/min (ref >60)
GFR calc non Af Amer: >60 mL/min (ref >60)

## 2017-09-27 LAB — I-STAT CG4 LACTIC ACID, ED: Lactic Acid, Venous: 1.88 mmol/L (ref 0.5–1.9)

## 2017-09-27 LAB — LIPASE, BLOOD: Lipase: 20 U/L (ref 11–51)

## 2017-09-27 SURGERY — SIGMOIDOSCOPY, FLEXIBLE
Anesthesia: Moderate Sedation

## 2017-09-27 MED ORDER — MORPHINE SULFATE (PF) 4 MG/ML IV SOLN
4.0000 mg | Freq: Once | INTRAVENOUS | Status: AC
  Administered 2017-09-27: 4 mg via INTRAVENOUS
  Filled 2017-09-27: qty 1

## 2017-09-27 MED ORDER — HYDROMORPHONE HCL 1 MG/ML IJ SOLN
0.5000 mg | INTRAMUSCULAR | Status: DC | PRN
  Filled 2017-09-27: qty 1

## 2017-09-27 MED ORDER — FENTANYL CITRATE (PF) 100 MCG/2ML IJ SOLN
INTRAMUSCULAR | Status: AC
  Filled 2017-09-27: qty 2

## 2017-09-27 MED ORDER — MIDAZOLAM HCL 5 MG/ML IJ SOLN
INTRAMUSCULAR | Status: AC
  Filled 2017-09-27: qty 2

## 2017-09-27 MED ORDER — SODIUM CHLORIDE 0.9% FLUSH
3.0000 mL | Freq: Two times a day (BID) | INTRAVENOUS | Status: DC
  Administered 2017-09-27 – 2017-09-29 (×4): 3 mL via INTRAVENOUS

## 2017-09-27 MED ORDER — FENTANYL CITRATE (PF) 100 MCG/2ML IJ SOLN
INTRAMUSCULAR | Status: DC | PRN
  Administered 2017-09-27: 25 ug via INTRAVENOUS

## 2017-09-27 MED ORDER — ENOXAPARIN SODIUM 40 MG/0.4ML ~~LOC~~ SOLN
40.0000 mg | SUBCUTANEOUS | Status: DC
  Filled 2017-09-27 (×2): qty 0.4

## 2017-09-27 MED ORDER — SODIUM CHLORIDE 0.9 % IV SOLN
1000.0000 mL | INTRAVENOUS | Status: DC
  Administered 2017-09-27 – 2017-10-01 (×9): 1000 mL via INTRAVENOUS

## 2017-09-27 MED ORDER — HYDROMORPHONE HCL 2 MG/ML IJ SOLN: 0.5000 mg | INTRAMUSCULAR | Status: DC | PRN

## 2017-09-27 MED ORDER — METRONIDAZOLE IN NACL 5-0.79 MG/ML-% IV SOLN
500.0000 mg | Freq: Three times a day (TID) | INTRAVENOUS | Status: DC
  Administered 2017-09-27 – 2017-10-01 (×12): 500 mg via INTRAVENOUS
  Filled 2017-09-27 (×12): qty 100

## 2017-09-27 MED ORDER — SODIUM CHLORIDE 0.9 % IV BOLUS (SEPSIS)
500.0000 mL | Freq: Once | INTRAVENOUS | Status: AC
  Administered 2017-09-27: 500 mL via INTRAVENOUS

## 2017-09-27 MED ORDER — MIDAZOLAM HCL 10 MG/2ML IJ SOLN
INTRAMUSCULAR | Status: DC | PRN
  Administered 2017-09-27: 2 mg via INTRAVENOUS

## 2017-09-27 MED ORDER — CIPROFLOXACIN IN D5W 400 MG/200ML IV SOLN
400.0000 mg | Freq: Two times a day (BID) | INTRAVENOUS | Status: DC
  Administered 2017-09-27 – 2017-09-28 (×2): 400 mg via INTRAVENOUS
  Filled 2017-09-27 (×2): qty 200

## 2017-09-27 MED ORDER — ONDANSETRON HCL 4 MG/2ML IJ SOLN
4.0000 mg | Freq: Once | INTRAMUSCULAR | Status: AC
  Administered 2017-09-27: 4 mg via INTRAVENOUS
  Filled 2017-09-27 (×2): qty 2

## 2017-09-27 NOTE — Consult Note (Signed)
[facility] Surgery Consult/Admission Note  Jesse [name] Jul 02, 1940  [id number].    Requesting MD: Dr. Tomi Bamberger Chief Complaint/Reason for Consult: volvulus  HPI:  Pt is a 77 yo male with a history of a. Fib currently in NSR (went into a. Fib after surgery), dementia, depression, prostate cancer diagnosed in 2013 treated with radiation, hypothyroidism who presented to the ED due to sudden onset of abdominal pain that started yesterday morning. Initially pain was about a 2/10. Pain has progressively worsened throughout the day with increased bloating. Pain is now severe, diffuse, non radiating, and the morphine is not relieving pain. Last BM 2 days ago was normal without blood. No nausea or vomiting. Was just eating soup and liquids yesterday. Wife is the historian. She denies CP, SOB, urinary symptoms. Surgical history includes b/l inguinal hernia repairs. No anticoagulation.   ED Course: Xray acute abdomen showed Massive gaseous distention of the colon, with bowel-gas pattern concerning for sigmoid volvulus. Large stool burden throughout the colon. WBC 11.2  ROS:  Review of Systems  Constitutional: Negative for chills, diaphoresis and fever.  HENT: Negative for sore throat.   Respiratory: Negative for cough and shortness of breath.   Cardiovascular: Negative for chest pain.  Gastrointestinal: Positive for abdominal pain. Negative for blood in stool, constipation, diarrhea, nausea and vomiting.  Genitourinary: Negative for dysuria.  Skin: Negative for rash.  Neurological: Negative for dizziness and loss of consciousness.  Psychiatric/Behavioral: Positive for depression.  All other systems reviewed and are negative.    Family History  Problem Relation Age of Onset  . Atrial fibrillation Mother   . Atrial fibrillation Sister   . Cancer Neg Hx     Past Medical History:  Diagnosis Date  . Arthritis   . Atrial fibrillation ([place])   . Complication of anesthesia    Atrial  fibrillation during surgery   . Dementia   . Depression   . Hard of hearing   . Hx of constipation   . Osteoporosis    DEXA 11/09/10. Completed treatemtn with Fosamax.  . Prostate cancer ([place])   . Restless legs   . Thyroid disease    hypothyroidism  . Vitiligo     Past Surgical History:  Procedure Laterality Date  . BACK SURGERY    . COLONOSCOPY    . JOINT REPLACEMENT    . LAMINECTOMY  10/2011   cervical and lumbar laminectomy  . PROSTATE BIOPSY  07/10/2011  . PROSTATE BIOPSY  09/22/15  . TOTAL HIP ARTHROPLASTY Right 02/29/2012  . TOTAL HIP ARTHROPLASTY Right 07/04/2016   Procedure: RIGHT TOTAL HIP ARTHROPLASTY ANTERIOR APPROACH;  Surgeon: Rod Can, MD;  Location: [facility];  Service: Orthopedics;  Laterality: Right;  . TRANSURETHRAL RESECTION OF PROSTATE      Social History:  reports that he quit smoking about 43 years ago. His smoking use included cigarettes. He has never used smokeless tobacco. He reports that he drinks alcohol. He reports that he does not use drugs.  Allergies: No Known Allergies   (Not in a hospital admission)  Blood pressure 116/86, pulse 65, temperature 97.7 F (36.5 C), temperature source Oral, resp. rate (!) 21, height 5' 11" (1.803 m), weight 71.2 kg (157 lb), SpO2 99 %.  Physical Exam  Constitutional: He does not appear ill. No distress.  HENT:  Head: Normocephalic and atraumatic.  Nose: Nose normal.  Mouth/Throat: Oropharynx is clear and moist. No oropharyngeal exudate.  Eyes: Pupils are equal, round, and reactive to light. Conjunctivae are normal.  Right eye exhibits no discharge. Left eye exhibits no discharge. No scleral icterus.  Neck: Normal range of motion. Neck supple. No thyromegaly present.  Cardiovascular: Normal rate, regular rhythm, normal heart sounds and intact distal pulses.  No murmur heard. Pulses:      Radial pulses are 2+ on the right side, and 2+ on the left side.       Dorsalis pedis pulses are 2+ on the right side, and  2+ on the left side.  Pulmonary/Chest: Effort normal and breath sounds normal. No respiratory distress. He has no wheezes. He has no rhonchi. He has no rales.  Abdominal: He exhibits distension. Bowel sounds are decreased. There is generalized tenderness. There is rigidity. There is no guarding. No hernia.  unable to assess HSM due to distention  Musculoskeletal: Normal range of motion. He exhibits no edema, tenderness or deformity.  Lymphadenopathy:    He has no cervical adenopathy.  Neurological: He is alert. No sensory deficit.  Patient answers questions appropriately but wife reported most of the history   Skin: Skin is warm and dry. No rash noted. He is not diaphoretic.  Nursing note and vitals reviewed.   Results for orders placed or performed during the hospital encounter of 09/27/17 (from the past 48 hour(s))  Lipase, blood     Status: None   Collection Time: 09/27/17  9:23 AM  Result Value Ref Range   Lipase 20 11 - 51 U/L    Comment: Performed at [facility], [address], [place], [place] [address]  Comprehensive metabolic panel     Status: Abnormal   Collection Time: 09/27/17  9:23 AM  Result Value Ref Range   Sodium 134 (L) 135 - 145 mmol/L   Potassium 4.4 3.5 - 5.1 mmol/L   Chloride 99 (L) 101 - 111 mmol/L   CO2 22 22 - 32 mmol/L   Glucose, Bld 178 (H) 65 - 99 mg/dL   BUN 26 (H) 6 - 20 mg/dL   Creatinine, Ser 0.96 0.61 - 1.24 mg/dL   Calcium 9.1 8.9 - 10.3 mg/dL   Total Protein 6.9 6.5 - 8.1 g/dL   Albumin 4.1 3.5 - 5.0 g/dL   AST 29 15 - 41 U/L   ALT 23 17 - 63 U/L   Alkaline Phosphatase 70 38 - 126 U/L   Total Bilirubin 1.2 0.3 - 1.2 mg/dL   GFR calc non Af Amer >60 >60 mL/min   GFR calc Af Amer >60 >60 mL/min    Comment: (NOTE) The eGFR has been calculated using the CKD EPI equation. This calculation has not been validated in all clinical situations. eGFR's persistently <60 mL/min signify possible Chronic Kidney Disease.    Anion gap 13 5 - 15     Comment: Performed at [facility] [address], [place], [place] [address]  CBC     Status: Abnormal   Collection Time: 09/27/17  9:23 AM  Result Value Ref Range   WBC 11.2 (H) 4.0 - 10.5 K/uL   RBC 5.36 4.22 - 5.81 MIL/uL   Hemoglobin 16.3 13.0 - 17.0 g/dL   HCT 46.5 39.0 - 52.0 %   MCV 86.8 78.0 - 100.0 fL   MCH 30.4 26.0 - 34.0 pg   MCHC 35.1 30.0 - 36.0 g/dL   RDW 13.8 11.5 - 15.5 %   Platelets 233 150 - 400 K/uL    Comment: Performed at [facility], [place] [address], [place], [place]  [address]   Dg Abdomen Acute W/chest  Result Date: 09/27/2017 CLINICAL DATA:  Severe abdominal pain for 2 days. EXAM: DG ABDOMEN ACUTE W/ 1V CHEST COMPARISON:  None. FINDINGS: Heart is upper limits normal in size. Minimal left base atelectasis. Right lung clear. No effusions. Marked gaseous distention of the colon, with large bean shaped loop of bowel in the abdomen measuring up to 13.7 cm in diameter. This is concerning for sigmoid volvulus. Large stool burden throughout the colon. No free air. No organomegaly. IMPRESSION: Massive gaseous distention of the colon, with bowel-gas pattern concerning for sigmoid volvulus. Large stool burden throughout the colon. Electronically Signed   By: Rolm Baptise M.D.   On: 09/27/2017 10:35      Assessment/Plan Hypothyroidism Hx of prostate cancer Chronic constipation Depression Dementia  Hx of A. Fib - happened after surgery years ago, currently in NSR and not on anticoagulation  Abdominal pain, sigmoid volvulus seen on abd xray - GI consulted for flex sigmoidoscopy for possible decompression - NPO - Patient does not need NGT as he is not nauseated or vomiting  - If patient fails to improve with flex sig, may require operative intervention which would potentially mean a Hartmann's procedure  - We will follow along. Discussed plan and recommendations with patient's wife  Kalman Drape [facility] Surgery 09/27/2017, 11:24  AM Pager: [phone] Consults: [phone] Mon-Fri 7:00 am-4:30 pm Sat-Sun 7:00 am-11:30 am

## 2017-09-27 NOTE — H&P (Signed)
History and Physical    Jesse [name]:[id number] DOB: Oct 23, 1940 DOA: 09/27/2017  PCP: Marletta Lor, MD  Patient coming from: Home  I have personally briefly reviewed patient's old medical records in [place]  Chief Complaint: Abdominal pain since yesterday  HPI: Jesse [name] is a 77 y.o. male with medical history significant of chronic constipation, hypothyroidism, recent major depression, mild dementia, episode of atrial fibrillation, and primary osteoarthritis of the right hip who presented to the emergency department today brought in by his wife due to abdominal pain and distention.  He states that he started having abdominal pain last night that has worsened.  He has become very distended and bloated.  She was concerned and brought him in for further evaluation and management.  The emergency department physical examination revealed acceptable vital signs and no fecal impaction and acute abdominal series showed large distention of the colon consistent with a sigmoid volvulus.  Surgery was consulted who recommended GI see the patient.  Dr. Arelia Longest from GI has seen the patient and he decompressed him but he is concerned about the appearance of the colon which appeared dusky.  He has further discussed with surgery and the plan is to admit the patient to monitor his course given the poor appearance of his colon. ED Course: Supple sigmoidoscopy with reduction of volvulus, continued pain requiring IV pain medications, decision to admit.  Surgical consultation which recommended continued observation after GI saw the patient and decompressed him.  I examined the patient prior to his decompression of his abdomen.  Denies headache, blurry vision, chest pain, shortness of breath, cough, sputum production, dysuria, urinary frequency, nausea, vomiting, skin rash, skin masses, unilateral weakness or numbness  Review of Systems: As per HPI otherwise all other systems reviewed and  negative.      Past Medical History:  Diagnosis Date  . Arthritis   . Atrial fibrillation ([place])   . Complication of anesthesia    Atrial fibrillation during surgery   . Dementia   . Depression   . Hard of hearing   . Hx of constipation   . Osteoporosis    DEXA 11/09/10. Completed treatemtn with Fosamax.  . Prostate cancer ([place])   . Restless legs   . Thyroid disease    hypothyroidism  . Vitiligo     Past Surgical History:  Procedure Laterality Date  . BACK SURGERY    . COLONOSCOPY    . JOINT REPLACEMENT    . LAMINECTOMY  10/2011   cervical and lumbar laminectomy  . PROSTATE BIOPSY  07/10/2011  . PROSTATE BIOPSY  09/22/15  . TOTAL HIP ARTHROPLASTY Right 02/29/2012  . TOTAL HIP ARTHROPLASTY Right 07/04/2016   Procedure: RIGHT TOTAL HIP ARTHROPLASTY ANTERIOR APPROACH;  Surgeon: Rod Can, MD;  Location: [facility];  Service: Orthopedics;  Laterality: Right;  . TRANSURETHRAL RESECTION OF PROSTATE       reports that he quit smoking about 43 years ago. His smoking use included cigarettes. He has never used smokeless tobacco. He reports that he drinks alcohol. He reports that he does not use drugs.  No Known Allergies  Family History  Problem Relation Age of Onset  . Atrial fibrillation Mother   . Atrial fibrillation Sister   . Cancer Neg Hx      Prior to Admission medications   Medication Sig Start Date End Date Taking? Authorizing Provider  docusate sodium (COLACE) 100 MG capsule Take 1 capsule (100 mg total) by mouth every 12 (twelve)  hours. Patient taking differently: Take 100 mg by mouth daily.  06/14/17  Yes Mackuen, Courteney Lyn, MD  DULoxetine (CYMBALTA) 30 MG capsule Take 60 mg by mouth daily.    Yes [provider]  Glucosamine HCl-MSM (GLUCOSAMINE-MSM PO) Take 1 tablet by mouth daily.   Yes [provider]  levothyroxine (SYNTHROID, LEVOTHROID) 50 MCG tablet Take 1 tablet (50 mcg total) daily before breakfast by mouth. 04/12/17  Yes Martinique, Betty G, MD   Multiple Vitamins-Minerals (MULTIVITAMIN ADULT PO) Take 1 tablet by mouth daily. One a Day   Yes [provider]  polyethylene glycol (MIRALAX / GLYCOLAX) packet Take 17 g by mouth daily as needed.   Yes [provider]  escitalopram (LEXAPRO) 5 MG tablet Take 5 mg by mouth daily.    [provider]    Physical Exam:  Constitutional: NAD, calm, comfortable Vitals:   09/27/17 1345 09/27/17 1350 09/27/17 1400 09/27/17 1410  BP: 116/79 119/81 114/79 119/72  Pulse: 63 63 63 65  Resp: 20 17 18 20   Temp:      TempSrc:      SpO2: 99% 99% 97% 96%  Weight:      Height:       Eyes: PERRL, lids and conjunctivae normal ENMT: Mucous membranes are moist. Posterior pharynx clear of any exudate or lesions.Normal dentition.  Neck: normal, supple, no masses, no thyromegaly Respiratory: clear to auscultation bilaterally, no wheezing, no crackles. Normal respiratory effort. No accessory muscle use.  Cardiovascular: Regular rate and rhythm, no murmurs / rubs / gallops. No extremity edema. 2+ pedal pulses. No carotid bruits.  Abdomen: Diffusely tender with significant distention, very tympanitic, no hepatosplenomegaly, significant pain with rebound and guarding. Musculoskeletal: no clubbing / cyanosis. No joint deformity upper and lower extremities. Good ROM, no contractures. Normal muscle tone.  Skin: no rashes, lesions, ulcers. No induration Neurologic: CN 2-12 grossly intact. Sensation intact, DTR normal. Strength 5/5 in all 4.  Psychiatric: Normal judgment and insight. Alert and oriented x 3. Normal mood.    Labs on Admission: I have personally reviewed following labs and imaging studies  CBC: Recent Labs  Lab 09/27/17 0923  WBC 11.2*  HGB 16.3  HCT 46.5  MCV 86.8  PLT 637   Basic Metabolic Panel: Recent Labs  Lab 09/27/17 0923  NA 134*  K 4.4  CL 99*  CO2 22  GLUCOSE 178*  BUN 26*  CREATININE 0.96  CALCIUM 9.1   GFR: Estimated Creatinine Clearance:  64.9 mL/min (by C-G formula based on SCr of 0.96 mg/dL). Liver Function Tests: Recent Labs  Lab 09/27/17 0923  AST 29  ALT 23  ALKPHOS 70  BILITOT 1.2  PROT 6.9  ALBUMIN 4.1   Recent Labs  Lab 09/27/17 0923  LIPASE 20    Radiological Exams on Admission: Dg Abdomen Acute W/chest  Result Date: 09/27/2017 CLINICAL DATA:  Severe abdominal pain for 2 days. EXAM: DG ABDOMEN ACUTE W/ 1V CHEST COMPARISON:  None. FINDINGS: Heart is upper limits normal in size. Minimal left base atelectasis. Right lung clear. No effusions. Marked gaseous distention of the colon, with large bean shaped loop of bowel in the abdomen measuring up to 13.7 cm in diameter. This is concerning for sigmoid volvulus. Large stool burden throughout the colon. No free air. No organomegaly. IMPRESSION: Massive gaseous distention of the colon, with bowel-gas pattern concerning for sigmoid volvulus. Large stool burden throughout the colon. Electronically Signed   By: Rolm Baptise M.D.   On: 09/27/2017  10:35    EKG: Independently reviewed.  Sinus rhythm with borderline QT when compared to July 18, 2017 QT interval has increased.  Assessment/Plan Principal Problem:   Volvulus of sigmoid colon (HCC) Active Problems:   Chronic constipation   Hypothyroidism   Depression, major, single episode, severe (HCC)   Mild dementia   Atrial fibrillation (HCC)   Primary osteoarthritis of right hip    1.  Volvulus of sigmoid colon: Patient presented with a day and a half of severe pain was found to have a volvulus.  GI decompressed the patient and the volvulus was treated however the mucosa looked a bit dusky and the patient will be kept for further evaluation based on the concern of poor condition of his mucosa and the possibility of complications.  I have started ciprofloxacin and metronidazole to cover for bowel flora.  2.  Chronic constipation: Patient is followed in GI clinic for this.  We will hold his medications for  now.  3.  Hypothyroidism continue Synthroid.  4.  Major depression single episode but severe: Patient had been started on Lexapro but he stopped it unbeknownst to his wife who is an Therapist, sports.  After it was started she found that he had only taken 2 days worth.  He followed up with his primary care doctor and is now taking Cymbalta.  He felt the Lexapro made him dizzy.  5.  Mild dementia: Patient recently underwent neuropsychiatric testing and was found to have some mild dementia.  Please see extensive notes in epic.  6.  Atrial fibrillation: Patient did have one episode of atrial fibrillation years ago.  It occurred in the postoperative setting after a cervical laminectomy in 2013.  He was briefly treated with amiodarone and anticoagulants.  He had no arrhythmia recurrence and he stopped all of his cardiac medications and anticoagulants.  7.  Primary osteoarthritis of the right hip noted he takes glucosamine and chondroitin.  DVT prophylaxis: SCDs Code Status: Full code Family Communication: Spoke with patient's wife who was present at admission. Disposition Plan: Home in 48 to 72 hours. Consults called: Dr. Carlean Purl from GI, Dr. Georgette Dover from surgery Admission status: Inpatient   Lady Deutscher MD [facility] Pager [phone]  If 7PM-7AM, please contact night-coverage [email or web address] Password [email or web address]  09/27/2017, 2:21 PM

## 2017-09-27 NOTE — Brief Op Note (Signed)
09/27/2017  1:45 PM  PATIENT:  Jesse [name]  77 y.o. male  PRE-OPERATIVE DIAGNOSIS:  volvulus  POST-OPERATIVE DIAGNOSIS:  Same  PROCEDURE:  Procedure(s): FLEXIBLE SIGMOIDOSCOPY (N/A)  SURGEON:  Surgeon(s) and Role:    * Gatha Mayer, MD - Primary  ANESTHESIA:  Fentanyl 25 ug and Versed 2 mg IV  EBL:  None   Volvulus - stenosis at rectosigmoid - passed and then very dilated colon proximal - mucosa dusky and purple - nothing would wash off and gentle contact with forceps did not dislodge material so think adherent clot material  Successful decompression - abdomen significantly softer though it remains moderately distended   Red rubber rectal tube passed and gas out  Ass  Decompressed sigmoid volvulus Ishcemic bowel suspected secodnary to volvulus   Plan 1) Admit 2) rectal tube to gravity 3) Recheck xray 4) Prophylactic Abx 5) Check lactate

## 2017-09-27 NOTE — ED Notes (Signed)
ED Provider at bedside. 

## 2017-09-27 NOTE — ED Notes (Signed)
Family brought back from waiting room to speak with Dr. Molli Posey.

## 2017-09-27 NOTE — ED Notes (Signed)
Endoscopy team at bedside 

## 2017-09-27 NOTE — Consult Note (Addendum)
Referring Provider: ED - Dr. Tomi Bamberger Primary Care Physician:  Marletta Lor, MD Primary Gastroenterologist:  Jolly Mango,  MD  Reason for Consultation:   Sigmoid volvulus   ASSESSMENT AND PLAN:     25. 77 yo male with massively distended abdomen, probable sigmoid volvulus by xray.  2. Chronic constipation, will need aggressive bowel regimen at discharge     [name]   I have taken an interval history, reviewed the chart and examined the patient. I agree with the Advanced Practitioner's note, impression and recommendations.   He needs decompressive sigmoidoscopy The risks and benefits as well as alternatives of endoscopic procedure(s) have been discussed and reviewed. All questions answered. The patient agrees to proceed.  Gatha Mayer, MD, [facility] 09/27/2017 1:22 PM     -He will undergo emergentg flex sigmoidoscopy with decompression .Marland Kitchen  The risks and benefits of the procedure were discussed and the patient agrees to proceed.  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 His wife is a former Therapist, sports and meticulously monitors patient's food intake and bowel movements. She says that patient's bowels have actually been moving okay at home. He had a normal BM two days ago. Yesterday he developed progressive abdominal distention and pain. No nausea / vomiting. Abdominal films shows massive colonic distention suggesting sigmoid volvulus. He is afebrile, normal WBC. BP elevated but in pain despite Morphine  Patient had a complete screening colonoscopy in 2011 with findings of only mild diverticulosis    Past Medical History:  Diagnosis Date  . Arthritis   . Atrial fibrillation ([place])   . Complication of anesthesia    Atrial fibrillation during surgery   . Dementia   . Depression   . Hard of hearing   . Hx of constipation   . Osteoporosis    DEXA 11/09/10. Completed treatemtn with  Fosamax.  . Prostate cancer ([place])   . Restless legs   . Thyroid disease    hypothyroidism  . Vitiligo     Past Surgical History:  Procedure Laterality Date  . BACK SURGERY    . COLONOSCOPY    . JOINT REPLACEMENT    . LAMINECTOMY  10/2011   cervical and lumbar laminectomy  . PROSTATE BIOPSY  07/10/2011  . PROSTATE BIOPSY  09/22/15  . TOTAL HIP ARTHROPLASTY Right 02/29/2012  . TOTAL HIP ARTHROPLASTY Right 07/04/2016   Procedure: RIGHT TOTAL HIP ARTHROPLASTY ANTERIOR APPROACH;  Surgeon: Rod Can, MD;  Location: [facility];  Service: Orthopedics;  Laterality: Right;  . TRANSURETHRAL RESECTION OF PROSTATE      Prior to Admission medications   Medication Sig Start Date End Date Taking? Authorizing Provider  docusate sodium (COLACE) 100 MG capsule Take 1 capsule (100 mg total) by mouth every 12 (twelve) hours. Patient taking differently: Take 100 mg by mouth daily.  06/14/17  Yes Mackuen, Courteney Lyn, MD  DULoxetine (CYMBALTA) 30 MG capsule Take 60 mg by mouth daily.    Yes [provider]  Glucosamine HCl-MSM (GLUCOSAMINE-MSM PO) Take 1 tablet by mouth daily.   Yes [provider]  levothyroxine (SYNTHROID, LEVOTHROID) 50 MCG tablet Take 1 tablet (50 mcg total) daily before breakfast by mouth. 04/12/17  Yes Martinique, Betty G, MD  Multiple Vitamins-Minerals (MULTIVITAMIN ADULT PO) Take 1 tablet by mouth daily. One a Day   Yes [provider]  polyethylene glycol (MIRALAX / GLYCOLAX) packet Take 17 g by mouth  daily as needed.   Yes [provider]  escitalopram (LEXAPRO) 5 MG tablet Take 5 mg by mouth daily.    [provider]    Current Facility-Administered Medications  Medication Dose Route Frequency Provider Last Rate Last Dose  . 0.9 %  sodium chloride infusion  1,000 mL Intravenous Continuous Dorie Rank, MD 125 mL/hr at 09/27/17 1213 1,000 mL at 09/27/17 1213   Current Outpatient Medications  Medication Sig Dispense Refill  . docusate  sodium (COLACE) 100 MG capsule Take 1 capsule (100 mg total) by mouth every 12 (twelve) hours. (Patient taking differently: Take 100 mg by mouth daily. ) 60 capsule 0  . DULoxetine (CYMBALTA) 30 MG capsule Take 60 mg by mouth daily.     . Glucosamine HCl-MSM (GLUCOSAMINE-MSM PO) Take 1 tablet by mouth daily.    Marland Kitchen levothyroxine (SYNTHROID, LEVOTHROID) 50 MCG tablet Take 1 tablet (50 mcg total) daily before breakfast by mouth. 90 tablet 3  . Multiple Vitamins-Minerals (MULTIVITAMIN ADULT PO) Take 1 tablet by mouth daily. One a Day    . polyethylene glycol (MIRALAX / GLYCOLAX) packet Take 17 g by mouth daily as needed.    Marland Kitchen escitalopram (LEXAPRO) 5 MG tablet Take 5 mg by mouth daily.      Allergies as of 09/27/2017  . (No Known Allergies)    Family History  Problem Relation Age of Onset  . Atrial fibrillation Mother   . Atrial fibrillation Sister   . Cancer Neg Hx     Social History   Socioeconomic History  . Marital status: Married    Spouse name: Not on file  . Number of children: Not on file  . Years of education: Not on file  . Highest education level: Not on file  Occupational History  . Not on file  Social Needs  . Financial resource strain: Not on file  . Food insecurity:    Worry: Not on file    Inability: Not on file  . Transportation needs:    Medical: Not on file    Non-medical: Not on file  Tobacco Use  . Smoking status: Former Smoker    Types: Cigarettes    Last attempt to quit: 05/28/1974    Years since quitting: 43.3  . Smokeless tobacco: Never Used  . Tobacco comment: smoked very little   Substance and Sexual Activity  . Alcohol use: Yes    Comment: 4-5 standard drinks per week/ stopped drinkin   . Drug use: No  . Sexual activity: Yes    Review of Systems: All systems reviewed and negative except where noted in HPI.  Physical Exam: Vital signs in last 24 hours: Temp:  [97.7 F (36.5 C)] 97.7 F (36.5 C) (05/03 0858) Pulse Rate:  [64-73] 64 (05/03  1200) Resp:  [20-28] 28 (05/03 1200) BP: (116-161)/(85-95) 149/90 (05/03 1200) SpO2:  [97 %-100 %] 98 % (05/03 1200) Weight:  [157 lb (71.2 kg)] 157 lb (71.2 kg) (05/03 0858)   General:   Alert, well-developed, white male in NAD Psych:  Pleasant, cooperative. Normal mood, flat effect. Eyes:  Pupils equal, sclera clear, no icterus.   Conjunctiva pink. Ears:  Normal auditory acuity. Nose:  No deformity, discharge,  or lesions. Neck:  Supple; no masses Lungs:  Clear throughout to auscultation.   No wheezes, crackles, or rhonchi.  Heart:  Regular rate and rhythm; no murmurs, no edema Abdomen:  Markedly distended, hypoactive bowel sounds. Diffusely tender    Rectal:  Deferred. No impaction  by EDP Msk:  Symmetrical without gross deformities. . Neurologic:  Alert and  oriented x4;  grossly normal neurologically. Skin:  Intact without significant lesions or rashes..    Lab Results: Recent Labs    09/27/17 0923  WBC 11.2*  HGB 16.3  HCT 46.5  PLT 233   BMET Recent Labs    09/27/17 0923  NA 134*  K 4.4  CL 99*  CO2 22  GLUCOSE 178*  BUN 26*  CREATININE 0.96  CALCIUM 9.1   LFT Recent Labs    09/27/17 0923  PROT 6.9  ALBUMIN 4.1  AST 29  ALT 23  ALKPHOS 70  BILITOT 1.2    Studies/Results: Dg Abdomen Acute W/chest  Result Date: 09/27/2017 CLINICAL DATA:  Severe abdominal pain for 2 days. EXAM: DG ABDOMEN ACUTE W/ 1V CHEST COMPARISON:  None. FINDINGS: Heart is upper limits normal in size. Minimal left base atelectasis. Right lung clear. No effusions. Marked gaseous distention of the colon, with large bean shaped loop of bowel in the abdomen measuring up to 13.7 cm in diameter. This is concerning for sigmoid volvulus. Large stool burden throughout the colon. No free air. No organomegaly. IMPRESSION: Massive gaseous distention of the colon, with bowel-gas pattern concerning for sigmoid volvulus. Large stool burden throughout the colon. Electronically Signed   By: Rolm Baptise M.D.   On: 09/27/2017 10:35    [name], NP-C @  09/27/2017, 1:00 PM  Pager number [phone]

## 2017-09-27 NOTE — ED Triage Notes (Signed)
Family reports pt had onset of abd pain yesterday with distention. Reports no bm recently but denies n/v.

## 2017-09-27 NOTE — ED Notes (Signed)
DR. Fredrich Birks at bedside. Procedure explained to wife and consent obtained from patient.

## 2017-09-27 NOTE — ED Notes (Signed)
Patient transported to X-ray 

## 2017-09-27 NOTE — ED Notes (Signed)
Consulting Provider at bedside. 

## 2017-09-27 NOTE — ED Provider Notes (Addendum)
[facility] EMERGENCY DEPARTMENT Provider Note   CSN: [id number] Arrival date & time: 09/27/17  3903  Hx from pt and wife who is a nurse History   Chief Complaint Chief Complaint  Patient presents with  . Abdominal Pain    HPI Jesse [name] is a 77 y.o. male.  HPI Pt started having abdominal pain yesterday.  He did not eat much yesterday because his appetite is not good.  The pain seemed to be diffuse.  His abdomen looked bloated to his wife.  He did not sleep well because he was moaning.  Pt tried to have a bowel movement but he was unable to do so.   This morning the pain was more intense and very severe.  Wife decided to bring him to the ED.  The pain continues to get worse, it is squeezing and sharp.  Last bowel movement was two days ago.  No blood in the stool.  NO vomiting today.  Past Medical History:  Diagnosis Date  . Arthritis   . Atrial fibrillation ([place])   . Complication of anesthesia    Atrial fibrillation during surgery   . Dementia   . Depression   . Hard of hearing   . Hx of constipation   . Osteoporosis    DEXA 11/09/10. Completed treatemtn with Fosamax.  . Prostate cancer ([place])   . Restless legs   . Thyroid disease    hypothyroidism  . Vitiligo     Patient Active Problem List   Diagnosis Date Noted  . Cognitive impairment 08/27/2017  . Viral URI with cough 07/01/2017  . Depression, major, single episode, severe ([place]) 12/14/2016  . Primary osteoarthritis of right hip 07/04/2016  . Vitiligo 06/11/2016  . Osteoarthritis of right hip 06/05/2016  . Malignant neoplasm of prostate ([place]) 12/07/2015  . Atrial fibrillation ([place]) 12/07/2015  . Hypothyroidism 12/07/2015    Past Surgical History:  Procedure Laterality Date  . BACK SURGERY    . COLONOSCOPY    . JOINT REPLACEMENT    . LAMINECTOMY  10/2011   cervical and lumbar laminectomy  . PROSTATE BIOPSY  07/10/2011  . PROSTATE BIOPSY  09/22/15  . TOTAL HIP ARTHROPLASTY Right  02/29/2012  . TOTAL HIP ARTHROPLASTY Right 07/04/2016   Procedure: RIGHT TOTAL HIP ARTHROPLASTY ANTERIOR APPROACH;  Surgeon: Rod Can, MD;  Location: [facility];  Service: Orthopedics;  Laterality: Right;  . TRANSURETHRAL RESECTION OF PROSTATE          Home Medications    Prior to Admission medications   Medication Sig Start Date End Date Taking? Authorizing Provider  docusate sodium (COLACE) 100 MG capsule Take 1 capsule (100 mg total) by mouth every 12 (twelve) hours. Patient taking differently: Take 100 mg by mouth daily.  06/14/17  Yes Mackuen, Courteney Lyn, MD  DULoxetine (CYMBALTA) 30 MG capsule Take 60 mg by mouth daily.    Yes [provider]  Glucosamine HCl-MSM (GLUCOSAMINE-MSM PO) Take 1 tablet by mouth daily.   Yes [provider]  levothyroxine (SYNTHROID, LEVOTHROID) 50 MCG tablet Take 1 tablet (50 mcg total) daily before breakfast by mouth. 04/12/17  Yes Martinique, Betty G, MD  Multiple Vitamins-Minerals (MULTIVITAMIN ADULT PO) Take 1 tablet by mouth daily. One a Day   Yes [provider]  polyethylene glycol (MIRALAX / GLYCOLAX) packet Take 17 g by mouth daily as needed.   Yes [provider]  escitalopram (LEXAPRO) 5 MG tablet Take 5 mg by mouth daily.  [provider]    Family History Family History  Problem Relation Age of Onset  . Atrial fibrillation Mother   . Atrial fibrillation Sister   . Cancer Neg Hx     Social History Social History   Tobacco Use  . Smoking status: Former Smoker    Types: Cigarettes    Last attempt to quit: 05/28/1974    Years since quitting: 43.3  . Smokeless tobacco: Never Used  . Tobacco comment: smoked very little   Substance Use Topics  . Alcohol use: Yes    Comment: 4-5 standard drinks per week/ stopped drinkin   . Drug use: No     Allergies   Patient has no known allergies.   Review of Systems Review of Systems  All other systems reviewed and are negative.    Physical  Exam Updated Vital Signs BP (!) 156/91   Pulse 73   Temp 97.7 F (36.5 C) (Oral)   Resp (!) 22   Ht 1.803 m (5\' 11" )   Wt 71.2 kg (157 lb)   SpO2 100%   BMI 21.90 kg/m   Physical Exam  Constitutional: He appears ill. He appears distressed.  HENT:  Head: Normocephalic and atraumatic.  Right Ear: External ear normal.  Left Ear: External ear normal.  Eyes: Conjunctivae are normal. Right eye exhibits no discharge. Left eye exhibits no discharge. No scleral icterus.  Neck: Neck supple. No tracheal deviation present.  Cardiovascular: Normal rate, regular rhythm and intact distal pulses.  Pulmonary/Chest: Effort normal and breath sounds normal. No stridor. No respiratory distress. He has no wheezes. He has no rales.  Abdominal: He exhibits distension. Bowel sounds are decreased. There is generalized tenderness. There is guarding. There is no rebound. No hernia.  Tympanitic to percussion  Genitourinary:  Genitourinary Comments: No fecal impaction  Musculoskeletal: He exhibits no edema or tenderness.  Neurological: He is alert. He has normal strength. No cranial nerve deficit (no facial droop, extraocular movements intact, no slurred speech) or sensory deficit. He exhibits normal muscle tone. He displays no seizure activity. Coordination normal.  Skin: Skin is warm and dry. No rash noted.  Psychiatric: He has a normal mood and affect.  Nursing note and vitals reviewed.    ED Treatments / Results  Labs (all labs ordered are listed, but only abnormal results are displayed) Labs Reviewed  COMPREHENSIVE METABOLIC PANEL - Abnormal; Notable for the following components:      Result Value   Sodium 134 (*)    Chloride 99 (*)    Glucose, Bld 178 (*)    BUN 26 (*)    All other components within normal limits  CBC - Abnormal; Notable for the following components:   WBC 11.2 (*)    All other components within normal limits  LIPASE, BLOOD  URINALYSIS, ROUTINE W REFLEX MICROSCOPIC     EKG EKG Interpretation  Date/Time:  Friday Sep 27 2017 09:42:22 EDT Ventricular Rate:  62 PR Interval:    QRS Duration: 80 QT Interval:  468 QTC Calculation: 476 R Axis:   63 Text Interpretation:  Sinus rhythm Borderline prolonged QT interval No old tracing to compare Confirmed by Dorie Rank [phone]) on 09/27/2017 9:44:35 AM   Radiology Dg Abdomen Acute W/chest  Result Date: 09/27/2017 CLINICAL DATA:  Severe abdominal pain for 2 days. EXAM: DG ABDOMEN ACUTE W/ 1V CHEST COMPARISON:  None. FINDINGS: Heart is upper limits normal in size. Minimal left base atelectasis. Right lung clear. No effusions. Marked gaseous  distention of the colon, with large bean shaped loop of bowel in the abdomen measuring up to 13.7 cm in diameter. This is concerning for sigmoid volvulus. Large stool burden throughout the colon. No free air. No organomegaly. IMPRESSION: Massive gaseous distention of the colon, with bowel-gas pattern concerning for sigmoid volvulus. Large stool burden throughout the colon. Electronically Signed   By: Rolm Baptise M.D.   On: 09/27/2017 10:35    Procedures .Critical Care Performed by: Dorie Rank, MD Authorized by: Dorie Rank, MD   Critical care provider statement:    Critical care time (minutes):  35   Critical care was time spent personally by me on the following activities:  Discussions with consultants, evaluation of patient's response to treatment, examination of patient, ordering and performing treatments and interventions, ordering and review of laboratory studies, ordering and review of radiographic studies, pulse oximetry, re-evaluation of patient's condition, obtaining history from patient or surrogate and review of old charts   (including critical care time)  Medications Ordered in ED Medications  sodium chloride 0.9 % bolus 500 mL (0 mLs Intravenous Stopped 09/27/17 1110)    Followed by  0.9 %  sodium chloride infusion (has no administration in time range)  morphine  4 MG/ML injection 4 mg (4 mg Intravenous Given 09/27/17 1039)  ondansetron (ZOFRAN) injection 4 mg (4 mg Intravenous Given 09/27/17 1139)  morphine 4 MG/ML injection 4 mg (4 mg Intravenous Given 09/27/17 1139)     Initial Impression / Assessment and Plan / ED Course  I have reviewed the triage vital signs and the nursing notes.  Pertinent labs & imaging results that were available during my care of the patient were reviewed by me and considered in my medical decision making (see chart for details).  Clinical Course as of Sep 30 909  Fri Sep 27, 2017  1008 ABD [place] attemempted.  Unable to get adequate views of aorta.  No fluid noted in the hepatorenal space.  Concerned about bowel obstruction   [JK]  1009 Will check AAS and consider NG tube.  Likely will need additional imaging following that.   [JK]  1113 AAS show significant distension, possible volvulus.  NG tube ordered.  Will consult with general surgery.   [JK]  1141 D/w [facility].  They will consult on the patient.   [JK]  1334 GI is doing his procedure in the ED.  Will determine disposition following that.   [JK]    Clinical Course User Index [JK] Dorie Rank, MD    Presented to the emergency room for evaluation of abdominal pain and abdominal distention.  His plain film x-rays are consistent with a sigmoidvolvulus.  This correlates with his history and physical exam findings.  Patient has been given pain medications.  NG tube has been ordered.  I have consulted with general surgery as well as gastroenterology.  They will be evaluating the patient.  Patient may be a candidate for a sigmoidoscopy to help reduce the volvulus.  Plan on admission to the hospital for further treatment and evaluation.  Final Clinical Impressions(s) / ED Diagnoses   Final diagnoses:  Sigmoid volvulus ([place])      Dorie Rank, MD 09/27/17 1144  Corrected dictation error   Dorie Rank, MD 09/30/17 [phone]

## 2017-09-28 ENCOUNTER — Inpatient Hospital Stay (HOSPITAL_COMMUNITY): Payer: Medicare Other

## 2017-09-28 DIAGNOSIS — K5909 Other constipation: Secondary | ICD-10-CM

## 2017-09-28 DIAGNOSIS — F0390 Unspecified dementia without behavioral disturbance: Secondary | ICD-10-CM

## 2017-09-28 DIAGNOSIS — M1611 Unilateral primary osteoarthritis, right hip: Secondary | ICD-10-CM

## 2017-09-28 DIAGNOSIS — K562 Volvulus: Principal | ICD-10-CM

## 2017-09-28 DIAGNOSIS — E039 Hypothyroidism, unspecified: Secondary | ICD-10-CM

## 2017-09-28 DIAGNOSIS — F322 Major depressive disorder, single episode, severe without psychotic features: Secondary | ICD-10-CM

## 2017-09-28 LAB — COMPREHENSIVE METABOLIC PANEL
ALT: 16 U/L — ABNORMAL LOW (ref 17–63)
AST: 22 U/L (ref 15–41)
Albumin: 2.9 g/dL — ABNORMAL LOW (ref 3.5–5.0)
Alkaline Phosphatase: 51 U/L (ref 38–126)
Anion gap: 9 (ref 5–15)
BUN: 20 mg/dL (ref 6–20)
CO2: 23 mmol/L (ref 22–32)
Calcium: 7.8 mg/dL — ABNORMAL LOW (ref 8.9–10.3)
Chloride: 102 mmol/L (ref 101–111)
Creatinine, Ser: 0.95 mg/dL (ref 0.61–1.24)
GFR calc Af Amer: >60 mL/min (ref >60)
GFR calc non Af Amer: >60 mL/min (ref >60)
Glucose, Bld: 100 mg/dL — ABNORMAL HIGH (ref 65–99)
Potassium: 3.9 mmol/L (ref 3.5–5.1)
Sodium: 134 mmol/L — ABNORMAL LOW (ref 135–145)
Total Bilirubin: 0.8 mg/dL (ref 0.3–1.2)
Total Protein: 5 g/dL — ABNORMAL LOW (ref 6.5–8.1)

## 2017-09-28 LAB — CBC
HCT: 38.3 % — ABNORMAL LOW (ref 39.0–52.0)
Hemoglobin: 12.9 g/dL — ABNORMAL LOW (ref 13.0–17.0)
MCH: 29.6 pg (ref 26.0–34.0)
MCHC: 33.7 g/dL (ref 30.0–36.0)
MCV: 87.8 fL (ref 78.0–100.0)
Platelets: 169 10*3/uL (ref 150–400)
RBC: 4.36 MIL/uL (ref 4.22–5.81)
RDW: 14.1 % (ref 11.5–15.5)
WBC: 9.6 10*3/uL (ref 4.0–10.5)

## 2017-09-28 LAB — APTT: aPTT: 28 seconds (ref 24–36)

## 2017-09-28 LAB — PROTIME-INR
INR: 1.18
Prothrombin Time: 15 seconds (ref 11.4–15.2)

## 2017-09-28 MED ORDER — LACTATED RINGERS IV BOLUS: 1000.0000 mL | Freq: Three times a day (TID) | INTRAVENOUS | Status: AC | PRN

## 2017-09-28 MED ORDER — CEFTRIAXONE SODIUM 2 G IJ SOLR
2.0000 g | INTRAMUSCULAR | Status: DC
  Administered 2017-09-28 – 2017-09-30 (×3): 2 g via INTRAVENOUS
  Filled 2017-09-28 (×4): qty 20

## 2017-09-28 MED ORDER — SIMETHICONE 40 MG/0.6ML PO SUSP: 40.0000 mg | Freq: Four times a day (QID) | ORAL | Status: DC | PRN

## 2017-09-28 MED ORDER — SACCHAROMYCES BOULARDII 250 MG PO CAPS
250.0000 mg | ORAL_CAPSULE | Freq: Two times a day (BID) | ORAL | Status: DC
  Administered 2017-09-28 – 2017-09-30 (×5): 250 mg via ORAL
  Filled 2017-09-28 (×6): qty 1

## 2017-09-28 MED ORDER — POLYETHYLENE GLYCOL 3350 17 G PO PACK: 17.0000 g | PACK | Freq: Two times a day (BID) | ORAL | Status: DC

## 2017-09-28 MED ORDER — POLYETHYLENE GLYCOL 3350 17 G PO PACK
34.0000 g | PACK | Freq: Two times a day (BID) | ORAL | Status: DC
  Administered 2017-09-28 (×2): 34 g via ORAL
  Filled 2017-09-28 (×3): qty 2

## 2017-09-28 MED ORDER — BOOST / RESOURCE BREEZE PO LIQD CUSTOM
1.0000 | Freq: Three times a day (TID) | ORAL | Status: DC
  Administered 2017-09-28 – 2017-09-29 (×5): 1 via ORAL
  Filled 2017-09-28 (×10): qty 1

## 2017-09-28 NOTE — Progress Notes (Addendum)
PROGRESS NOTE    Jesse [name]  TDV:[id number] DOB: 04-26-1941 DOA: 09/27/2017 PCP: Marletta Lor, MD   Brief Narrative: Jesse [name] is a 77 y.o. male with history of chronic constipation, hypothyroidism, major depression, mild dementia, osteoarthritis.  Patient presented secondary to abdominal pain and distention.  He is found to have sigmoid volvulus and underwent sigmoidoscopy with reduction of volvulus in the emergency department.  Upon induction, GI noticed that there is colon mucosa was dusky and recommended hospital admission.   Assessment & Plan:   Principal Problem:   Volvulus of sigmoid colon ([place]) Active Problems:   Atrial fibrillation (H[facility])   Hypothyroidism   Primary osteoarthritis of right hip   Depression, major, single episode, severe (H[facility])   Mild dementia   Chronic constipation   Sigmoid volvulus Status post reduction with sigmoidoscopy on 5/3.  Patient with continued hematochezia.  No other concerns. -GI recommendations -Continue antibiotic prophylaxis with ciprofloxacin and Flagyl  History of atrial fibrillation Very remote history from 2013.  Currently sinus rhythm. -Discontinue telemetry  Dementia Outpatient management.  Mild.  Major depression -Continue Cymbalta.  Per report, Lexapro discontinued as an outpatient.  Will need to discontinue patient discharge  Hypothyroidism -Continue Synthroid 50 mcg daily  Osteoarthritis of the right hip Stable.  Acute blood loss anemia Dropped to 3.1 g/dL to 12.9 g/dL.  Patient with associated hematochezia. -Repeat H&H this afternoon still having hematochezia -Repeat CBC in the morning   DVT prophylaxis: SCDs in setting of GI bleeding Code Status:   Code Status: Full Code Family Communication: None at bedside Disposition Plan: Discharge pending GI management and recommendations   Consultants:   Gastroenterology  Procedures:   Sigmoidoscopy (5/3)  Antimicrobials:  Ciprofloxacin  (5/3>>  Flagyl (5/3>>   Subjective: Patient without any issues overnight.  Hematochezia.  Objective: Vitals:   09/27/17 1530 09/27/17 1637 09/27/17 2034 09/28/17 0545  BP: 112/70 115/69 101/63 116/71  Pulse: 65 64 68 66  Resp: 16 16    Temp:  97.6 F (36.4 C) (!) 97.5 F (36.4 C) 97.8 F (36.6 C)  TempSrc:  Oral Oral Oral  SpO2: 92% 98% 96% 98%  Weight:   71.2 kg (156 lb 15.5 oz)   Height:        Intake/Output Summary (Last 24 hours) at 09/28/2017 0843 Last data filed at 09/28/2017 0700 Gross per 24 hour  Intake 3564.59 ml  Output 700 ml  Net 2864.59 ml   Filed Weights   09/27/17 0858 09/27/17 2034  Weight: 71.2 kg (157 lb) 71.2 kg (156 lb 15.5 oz)    Examination:  General exam: Appears calm and comfortable Respiratory system: Clear to auscultation. Respiratory effort normal. Cardiovascular system: S1 & S2 heard, RRR. No murmurs, rubs, gallops or clicks. Gastrointestinal system: Abdomen is nondistended, soft and nontender. No organomegaly or masses felt. Normal bowel sounds heard. Central nervous system: Alert and oriented. No focal neurological deficits. Extremities: No edema. No calf tenderness Skin: No cyanosis. No rashes Psychiatry: Flat affect, depressed mood.    Data Reviewed: I have personally reviewed following labs and imaging studies  CBC: Recent Labs  Lab 09/27/17 0923 09/27/17 1416 09/28/17 0613  WBC 11.2* 11.7* 9.6  HGB 16.3 15.1 12.9*  HCT 46.5 44.1 38.3*  MCV 86.8 86.5 87.8  PLT 233 196 062   Basic Metabolic Panel: Recent Labs  Lab 09/27/17 0923 09/27/17 1416 09/28/17 0613  NA 134*  --  134*  K 4.4  --  3.9  CL 99*  --  102  CO2 22  --  23  GLUCOSE 178*  --  100*  BUN 26*  --  20  CREATININE 0.96 0.95 0.95  CALCIUM 9.1  --  7.8*   GFR: Estimated Creatinine Clearance: 65.6 mL/min (by C-G formula based on SCr of 0.95 mg/dL). Liver Function Tests: Recent Labs  Lab 09/27/17 0923 09/28/17 0613  AST 29 22  ALT 23 16*  ALKPHOS  70 51  BILITOT 1.2 0.8  PROT 6.9 5.0*  ALBUMIN 4.1 2.9*   Recent Labs  Lab 09/27/17 0923  LIPASE 20   No results for input(s): AMMONIA in the last 168 hours. Coagulation Profile: Recent Labs  Lab 09/28/17 0613  INR 1.18   Cardiac Enzymes: No results for input(s): CKTOTAL, CKMB, CKMBINDEX, TROPONINI in the last 168 hours. BNP (last 3 results) No results for input(s): PROBNP in the last 8760 hours. HbA1C: No results for input(s): HGBA1C in the last 72 hours. CBG: No results for input(s): GLUCAP in the last 168 hours. Lipid Profile: No results for input(s): CHOL, HDL, LDLCALC, TRIG, CHOLHDL, LDLDIRECT in the last 72 hours. Thyroid Function Tests: No results for input(s): TSH, T4TOTAL, FREET4, T3FREE, THYROIDAB in the last 72 hours. Anemia Panel: No results for input(s): VITAMINB12, FOLATE, FERRITIN, TIBC, IRON, RETICCTPCT in the last 72 hours. Sepsis Labs: Recent Labs  Lab 09/27/17 1442  LATICACIDVEN 1.88    No results found for this or any previous visit (from the past 240 hour(s)).       Radiology Studies: Dg Abd 1 View  Result Date: 09/27/2017 CLINICAL DATA:  Followup sigmoid volvulus falling rectal tube insertion. EXAM: ABDOMEN - 1 VIEW COMPARISON:  Earlier today. FINDINGS: Interval rectal tube with a significant decrease in caliber of the sigmoid colon. This previously measured 13.8 cm in diameter and currently measures 5.4 cm in diameter. There is persistent gaseous distention of the remainder the colon, filled with stool. Bilateral hip prostheses. IMPRESSION: 1. Resolved pattern of sigmoid volvulus following rectal tube insertion. 2. Continued gaseous distention and stool throughout the remainder of the colon. Electronically Signed   By: Claudie Revering M.D.   On: 09/27/2017 15:05   Dg Abdomen Acute W/chest  Result Date: 09/27/2017 CLINICAL DATA:  Severe abdominal pain for 2 days. EXAM: DG ABDOMEN ACUTE W/ 1V CHEST COMPARISON:  None. FINDINGS: Heart is upper limits  normal in size. Minimal left base atelectasis. Right lung clear. No effusions. Marked gaseous distention of the colon, with large bean shaped loop of bowel in the abdomen measuring up to 13.7 cm in diameter. This is concerning for sigmoid volvulus. Large stool burden throughout the colon. No free air. No organomegaly. IMPRESSION: Massive gaseous distention of the colon, with bowel-gas pattern concerning for sigmoid volvulus. Large stool burden throughout the colon. Electronically Signed   By: Rolm Baptise M.D.   On: 09/27/2017 10:35        Scheduled Meds: . enoxaparin (LOVENOX) injection  40 mg Subcutaneous Q24H  . sodium chloride flush  3 mL Intravenous Q12H   Continuous Infusions: . sodium chloride 1,000 mL (09/27/17 2237)  . ciprofloxacin Stopped (09/28/17 0400)  . metronidazole Stopped (09/28/17 0756)     LOS: 1 day     Cordelia Poche, MD [facility] 09/28/2017, 8:43 AM Pager: [phone]  If 7PM-7AM, please contact night-coverage [email or web address] 09/28/2017, 8:43 AM

## 2017-09-28 NOTE — Op Note (Signed)
[facility] Patient Name: Jesse [name] Procedure Date : 09/27/2017 MRN: [id number] Attending MD: Gatha Mayer , MD Date of Birth: March 30, 1941 CSN: [id number] Age: 77 Admit Type: Outpatient Procedure:                Flexible Sigmoidoscopy Indications:              Suspected volvulus Providers:                Gatha Mayer, MD, Burtis Junes, RN, Elspeth Cho,                            Technician Referring MD:              Medicines:                Fentanyl 25 micrograms IV, Midazolam 2 mg IV Complications:            No immediate complications. Estimated Blood Loss:     Estimated blood loss: none. Procedure:                Pre-Anesthesia Assessment:                           - Prior to the procedure, a History and Physical                            was performed, and patient medications and                            allergies were reviewed. The patient's tolerance of                            previous anesthesia was also reviewed. The risks                            and benefits of the procedure and the sedation                            options and risks were discussed with the patient.                            All questions were answered, and informed consent                            was obtained. Prior Anticoagulants: The patient has                            taken no previous anticoagulant or antiplatelet                            agents. ASA Grade Assessment: III - A patient with                            severe systemic disease. After reviewing the risks  and benefits, the patient was deemed in                            satisfactory condition to undergo the procedure.                           After obtaining informed consent, the scope was                            passed under direct vision. The [id number] ([id number])                            scope was introduced through the anus and advanced                            to the  the sigmoid colon. The flexible                            sigmoidoscopy was accomplished without difficulty.                            The patient tolerated the procedure well. The                            quality of the bowel preparation was none. Scope In: Scope Out: Findings:      The perianal and digital rectal examinations were normal.      A volvulus, with apparent diffuse ischemia (dusky mucosa in dilated       bowel area - NL below that), was found in the recto-sigmoid colon.       Decompression of the volvulus was attempted, and partial decompression       was achieved. Following the maneuver, a tube was placed to maintain the       decompression. Estimated blood loss: none. Impression:               - Volvulus. Partial decompression achieved.                            Suspected ischemic mucosa proximal                           - No specimens collected. Moderate Sedation:      Moderate (conscious) sedation was administered by the endoscopy nurse       and supervised by the endoscopist. The following parameters were       monitored: oxygen saturation, heart rate, blood pressure, respiratory       rate, EKG, adequacy of pulmonary ventilation, and response to care.       Total physician intraservice time was 12 minutes. Recommendation:           - Admit the patient to hospital ward for ongoing                            care.                           -  F/U xray confirmed improved distention 13 cm down                            to 5                           Keep tube in and check xray again in AM                           If recurs can reduce again                           empiric Abx given ischemic change Procedure Code(s):        --- Professional ---                           [phone], Sigmoidoscopy, flexible; with decompression                            (for pathologic distention) (eg, volvulus,                            megacolon), including placement of  decompression                            tube, when performed                           G0500, Moderate sedation services provided by the                            same physician or other qualified health care                            professional performing a gastrointestinal                            endoscopic service that sedation supports,                            requiring the presence of an independent trained                            observer to assist in the monitoring of the                            patient's level of consciousness and physiological                            status; initial 15 minutes of intra-service time;                            patient age 14 years or older (additional time 44  be reported with [phone], as appropriate) Diagnosis Code(s):        --- Professional ---                           K56.2, Volvulus CPT copyright 2017 [employer]. All rights reserved. The codes documented in this report are preliminary and upon coder review may  be revised to meet current compliance requirements. Gatha Mayer, MD 09/28/2017 9:30:25 AM This report has been signed electronically. Number of Addenda: 0

## 2017-09-28 NOTE — Progress Notes (Signed)
Patient made pee,he is continent,no retention.

## 2017-09-28 NOTE — Progress Notes (Signed)
   Patient Name: Jesse [name] Date of Encounter: 09/28/2017, 9:45 AM    Subjective  " I am passing some slimy stuff but not in pain"   Objective  BP 116/71 (BP Location: Left Arm)   Pulse 66   Temp 97.8 F (36.6 C) (Oral)   Resp 16   Ht 5\' 11"  (1.803 m)   Wt 156 lb 15.5 oz (71.2 kg)   SpO2 98%   BMI 21.89 kg/m  NAD abd is mildly distended - less than yesterday and softer and nontender BS + Rectal tube in place  Yesterday's post decompression xray showed marked decrease in colon distention though still distended at 5 cm+  CBC Latest Ref Rng & Units 09/28/2017 09/27/2017 09/27/2017  WBC 4.0 - 10.5 K/uL 9.6 11.7(H) 11.2(H)  Hemoglobin 13.0 - 17.0 g/dL 12.9(L) 15.1 16.3  Hematocrit 39.0 - 52.0 % 38.3(L) 44.1 46.5  Platelets 150 - 400 K/uL 169 196 233    Lab Results  Component Value Date   CREATININE 0.95 09/28/2017   BUN 20 09/28/2017   NA 134 (L) 09/28/2017   K 3.9 09/28/2017   CL 102 09/28/2017   CO2 23 09/28/2017   AXR today is pending    Assessment and Plan  1) Sigmoid volvulus 2) Ischemic colon mucosa suspected secondary to 1  Improved  Await xray today and as long as no surprises would start clears and remove rectal tube - not sure it is still in place in sigmoid anyway based upon position on yesterday's xray Would continue Abx for now and if better again tomorrow probably stop If volvulus recurs decompress again Defer to surgery re: repair - often wait to see if there is recurrence before operating and with the dusky mucosa would think we should let that heal.3  Gatha Mayer, MD, [facility] 09/28/2017 9:45 AM

## 2017-09-28 NOTE — Progress Notes (Signed)
Initial Nutrition Assessment  DOCUMENTATION CODES:  Non-severe (moderate) malnutrition in context of chronic illness  INTERVENTION:  Boost Breeze po TID, each supplement provides 250 kcal and 9 grams of protein  Recommend Ensure/Boost on D/C if patient depression continues to limit PO intake  NUTRITION DIAGNOSIS:  Inadequate oral intake related to chronic illness(Depression) as evidenced by moderate muscle/fat depletion  GOAL:  Patient will meet greater than or equal to 90% of their needs  MONITOR:  PO intake, Supplement acceptance, Vent status, Labs, Weight trends, I & O's  REASON FOR ASSESSMENT:  Malnutrition Screening Tool    ASSESSMENT:  77 y/o male w/ PMHx chronic constipation, hypothyroidism, depression, mild dementia, A fib, Prostate Cancer, HOH. Presents with acute onset abdominal pain and distension since evening of 5/2. Work up revealed sigmoid volvulus, s/p decompression. Being observed post-procedure.   On RD arrival, Patient up in [place] with spouse shaving him. Patient deferred majority of history to spouse.   Spouse states that the distension/abdominal pain  began "yesterday morning". Prior to this time, patient had been at his baseline. However, she goes on to talk about patient chronic habits. Apparently, the patient has declined over the past year due to "severe depression". She notes a flat affect and overall displays apathy. He no longer finds joy in the foods or the activities he used to enjoy tremendously. He does not eat as well as previously and she says his new favorite line is "you give me too much food". She says patient was started on cymbalta, but this has not helped.   Weight wise, she does believe the patient has lost weight since his depression began, but is unsure how much. Per review of chart, it is Difficult to determine if patient has had any significant weight loss; the patients weight has fluctuated between 157-170 lbs for the last year.   At this  time, the patient does have some appetite, though still has some moderate distension. Patients diet has been advanced to clears as of this morning. Spouse/patient were agreeable to supplementing diet w/ Boost Breeze. Though patient does not drink supplements at baseline, Spouse reports having used them in the past during his more severe periods of depression because patient would not eat and would lose weight. RD recommended these on discharge if he continues to have poor intake. Should obviously follow up with psych outpatient.   Physical Exam: Mild-moderate upper body muscle/fat wasting.   Labs: Albumin: 2.9, Glu: 100 Meds: IV abx  Recent Labs  Lab 09/27/17 0923 09/27/17 1416 09/28/17 0613  NA 134*  --  134*  K 4.4  --  3.9  CL 99*  --  102  CO2 22  --  23  BUN 26*  --  20  CREATININE 0.96 0.95 0.95  CALCIUM 9.1  --  7.8*  GLUCOSE 178*  --  100*   NUTRITION - FOCUSED PHYSICAL EXAM:   Most Recent Value  Orbital Region  Moderate depletion  Upper Arm Region  Moderate depletion  Thoracic and Lumbar Region  Moderate depletion  Buccal Region  Mild depletion  Temple Region  Mild depletion  Clavicle Bone Region  Moderate depletion  Clavicle and Acromion Bone Region  Moderate depletion  Scapular Bone Region  Unable to assess  Dorsal Hand  Mild depletion  Patellar Region  No depletion  Anterior Thigh Region  Unable to assess  Posterior Calf Region  Moderate depletion       Diet Order:   Diet Order  Diet clear liquid Room service appropriate? Yes; Fluid consistency: Thin  Diet effective now         EDUCATION NEEDS:  No education needs have been identified at this time  Skin:  Skin Assessment: Reviewed RN Assessment  Last BM:  5/3  Height:  Ht Readings from Last 1 Encounters:  09/27/17 5\' 11"  (1.803 m)   Weight:  Wt Readings from Last 1 Encounters:  09/27/17 156 lb 15.5 oz (71.2 kg)   Wt Readings from Last 10 Encounters:  09/27/17 156 lb 15.5 oz (71.2 kg)   08/27/17 161 lb (73 kg)  07/18/17 161 lb 9.6 oz (73.3 kg)  07/01/17 158 lb (71.7 kg)  06/03/17 161 lb 9.6 oz (73.3 kg)  04/30/17 167 lb 6.4 oz (75.9 kg)  04/29/17 162 lb 9.6 oz (73.8 kg)  02/20/17 157 lb (71.2 kg)  02/11/17 156 lb 6 oz (70.9 kg)  12/31/16 157 lb 2 oz (71.3 kg)   Ideal Body Weight:  78.18 kg  BMI:  Body mass index is 21.89 kg/m.  Estimated Nutritional Needs:  Kcal:  1900-2150 kcals (27-30 kcal/kg bw)  Protein:  78-93g pro (1.1-1.3 g/kg bw) Fluid:  >1.8 L fluid (1 ml/kcal)  Burtis Junes RD, LDN, CNSC Clinical Nutrition Available Tues-Sat via Pager: [phone] 09/28/2017 12:29 PM

## 2017-09-28 NOTE — Progress Notes (Signed)
[facility]  [address] [place], [place] [address] Phone: [phone]  FAX: [phone]      Jesse [name] [id number] [birth year]  CARE TEAM:  PCP: Marletta Lor, MD  Outpatient Care Team: Patient Care Team: Marletta Lor, MD as PCP - General (Internal Medicine)  Inpatient Treatment Team: Treatment Team: Attending Provider: Mariel Aloe, MD; Consulting Physician: Gatha Mayer, MD; Rounding Team: Edison Pace, Md, MD; Rounding Team: Fanny Dance, MD   Problem List:   Principal Problem:   Volvulus of sigmoid colon [facility]) Active Problems:   Hypothyroidism   Primary osteoarthritis of right hip   Depression, major, single episode, severe ([place])   Mild dementia   Chronic constipation   1 Day Post-Op  09/27/2017  Procedure(s): FLEXIBLE SIGMOIDOSCOPY decompression of sigmoid volvulus   Assessment  Stabilizing  Plan:  -Start liquid diet.  Aggressive bowel regimen.  Agree with antibiotics for now.  If clinically deteriorates, may require urgent colectomy with possible ostomy versus anastomosis if okay.  Otherwise, his volvulus is a side effect of his severe constipation.  Aggressive bowel regimen hopefully will lower that chance.  We will follow.   -VTE prophylaxis- SCDs, etc -mobilize as tolerated to help recovery  25 minutes spent in review, evaluation, examination, counseling, and coordination of care.  More than 50% of that time was spent in counseling.  [name], M.D., F.A.C.S. Gastrointestinal and Minimally Invasive Surgery [facility] [address] [place], [place] [address] [phone] Main / Paging   09/28/2017    Subjective: (Chief complaint)  Less pain.  Wife at bedside.  No more rectal bleeding.  More like liquid stool now.  He is hungry.  Objective:  Vital signs:  Vitals:   09/27/17 1530 09/27/17 1637 09/27/17 2034 09/28/17  0545  BP: 112/70 115/69 101/63 116/71  Pulse: 65 64 68 66  Resp: 16 16    Temp:  97.6 F (36.4 C) (!) 97.5 F (36.4 C) 97.8 F (36.6 C)  TempSrc:  Oral Oral Oral  SpO2: 92% 98% 96% 98%  Weight:   71.2 kg (156 lb 15.5 oz)   Height:        Last BM Date: 09/27/17  Intake/Output   Yesterday:  05/03 0701 - 05/04 0700 In: 3564.6 [I.V.:2347.9; IV Piggyback:1216.7] Out: 700 [Urine:500; Stool:200] This shift:  No intake/output data recorded.  Bowel function:  Flatus: YES  BM:  YES  Drain: Feculent   Physical Exam:  General: Pt awake/alert/oriented x4 in mild acute distress Eyes: PERRL, normal EOM.  Sclera clear.  No icterus Neuro: CN II-XII intact w/o focal sensory/motor deficits. Lymph: No head/neck/groin lymphadenopathy Psych:  No delerium/psychosis/paranoia HENT: Normocephalic, Mucus membranes moist.  No thrush Neck: Supple, No tracheal deviation Chest: No chest wall pain w good excursion CV:  Pulses intact.  Regular rhythm MS: Normal AROM mjr joints.  No obvious deformity  Abdomen: Somewhat firm.  Moderately distended.  Nontender.  No evidence of peritonitis.  No incarcerated hernias.  Ext:   No deformity.  No mjr edema.  No cyanosis Skin: No petechiae / purpura  Results:   Labs: Results for orders placed or performed during the hospital encounter of 09/27/17 (from the past 48 hour(s))  Lipase, blood     Status: None   Collection Time: 09/27/17  9:23 AM  Result Value Ref Range   Lipase 20 11 - 51 U/L    Comment: Performed at [facility]  Lab, [address] [place], [place] [address]  Comprehensive metabolic panel     Status: Abnormal   Collection Time: 09/27/17  9:23 AM  Result Value Ref Range   Sodium 134 (L) 135 - 145 mmol/L   Potassium 4.4 3.5 - 5.1 mmol/L   Chloride 99 (L) 101 - 111 mmol/L   CO2 22 22 - 32 mmol/L   Glucose, Bld 178 (H) 65 - 99 mg/dL   BUN 26 (H) 6 - 20 mg/dL   Creatinine, Ser 0.96 0.61 - 1.24 mg/dL   Calcium 9.1 8.9 - 10.3 mg/dL    Total Protein 6.9 6.5 - 8.1 g/dL   Albumin 4.1 3.5 - 5.0 g/dL   AST 29 15 - 41 U/L   ALT 23 17 - 63 U/L   Alkaline Phosphatase 70 38 - 126 U/L   Total Bilirubin 1.2 0.3 - 1.2 mg/dL   GFR calc non Af Amer >60 >60 mL/min   GFR calc Af Amer >60 >60 mL/min    Comment: (NOTE) The eGFR has been calculated using the CKD EPI equation. This calculation has not been validated in all clinical situations. eGFR's persistently <60 mL/min signify possible Chronic Kidney Disease.    Anion gap 13 5 - 15    Comment: Performed at [facility] [address], [place], [place] [address]  CBC     Status: Abnormal   Collection Time: 09/27/17  9:23 AM  Result Value Ref Range   WBC 11.2 (H) 4.0 - 10.5 K/uL   RBC 5.36 4.22 - 5.81 MIL/uL   Hemoglobin 16.3 13.0 - 17.0 g/dL   HCT 46.5 39.0 - 52.0 %   MCV 86.8 78.0 - 100.0 fL   MCH 30.4 26.0 - 34.0 pg   MCHC 35.1 30.0 - 36.0 g/dL   RDW 13.8 11.5 - 15.5 %   Platelets 233 150 - 400 K/uL    Comment: Performed at [facility], [place] [address], [place], [place] [address]  CBC     Status: Abnormal   Collection Time: 09/27/17  2:16 PM  Result Value Ref Range   WBC 11.7 (H) 4.0 - 10.5 K/uL   RBC 5.10 4.22 - 5.81 MIL/uL   Hemoglobin 15.1 13.0 - 17.0 g/dL   HCT 44.1 39.0 - 52.0 %   MCV 86.5 78.0 - 100.0 fL   MCH 29.6 26.0 - 34.0 pg   MCHC 34.2 30.0 - 36.0 g/dL   RDW 13.5 11.5 - 15.5 %   Platelets 196 150 - 400 K/uL    Comment: Performed at [facility], [place] [address] [place], [place] [address]  Creatinine, serum     Status: None   Collection Time: 09/27/17  2:16 PM  Result Value Ref Range   Creatinine, Ser 0.95 0.61 - 1.24 mg/dL   GFR calc non Af Amer >60 >60 mL/min   GFR calc Af Amer >60 >60 mL/min    Comment: (NOTE) The eGFR has been calculated using the CKD EPI equation. This calculation has not been validated in all clinical situations. eGFR's persistently <60 mL/min signify possible Chronic Kidney Disease. Performed at [facility], [place] [address] [place], [place] [address]   I-Stat CG4 Lactic Acid, ED     Status: None   Collection Time: 09/27/17  2:42 PM  Result Value Ref Range   Lactic Acid, Venous 1.88 0.5 - 1.9 mmol/L  Urinalysis, Routine w reflex microscopic     Status: Abnormal  Collection Time: 09/27/17  8:44 PM  Result Value Ref Range   Color, Urine YELLOW YELLOW   APPearance HAZY (A) CLEAR   Specific Gravity, Urine 1.028 1.005 - 1.030   pH 5.0 5.0 - 8.0   Glucose, UA 150 (A) NEGATIVE mg/dL   Hgb urine dipstick NEGATIVE NEGATIVE   Bilirubin Urine NEGATIVE NEGATIVE   Ketones, ur 20 (A) NEGATIVE mg/dL   Protein, ur 30 (A) NEGATIVE mg/dL   Nitrite NEGATIVE NEGATIVE   Leukocytes, UA NEGATIVE NEGATIVE   RBC / HPF 0-5 0 - 5 RBC/hpf   WBC, UA 0-5 0 - 5 WBC/hpf   Bacteria, UA NONE SEEN NONE SEEN   Mucus PRESENT     Comment: Performed at [facility] [address] [place], [place] [address]  Comprehensive metabolic panel     Status: Abnormal   Collection Time: 09/28/17  6:13 AM  Result Value Ref Range   Sodium 134 (L) 135 - 145 mmol/L   Potassium 3.9 3.5 - 5.1 mmol/L   Chloride 102 101 - 111 mmol/L   CO2 23 22 - 32 mmol/L   Glucose, Bld 100 (H) 65 - 99 mg/dL   BUN 20 6 - 20 mg/dL   Creatinine, Ser 0.95 0.61 - 1.24 mg/dL   Calcium 7.8 (L) 8.9 - 10.3 mg/dL   Total Protein 5.0 (L) 6.5 - 8.1 g/dL   Albumin 2.9 (L) 3.5 - 5.0 g/dL   AST 22 15 - 41 U/L   ALT 16 (L) 17 - 63 U/L   Alkaline Phosphatase 51 38 - 126 U/L   Total Bilirubin 0.8 0.3 - 1.2 mg/dL   GFR calc non Af Amer >60 >60 mL/min   GFR calc Af Amer >60 >60 mL/min    Comment: (NOTE) The eGFR has been calculated using the CKD EPI equation. This calculation has not been validated in all clinical situations. eGFR's persistently <60 mL/min signify possible Chronic Kidney Disease.    Anion gap 9 5 - 15    Comment: Performed at [facility] [address], [place], [place] [address]  CBC     Status: Abnormal    Collection Time: 09/28/17  6:13 AM  Result Value Ref Range   WBC 9.6 4.0 - 10.5 K/uL   RBC 4.36 4.22 - 5.81 MIL/uL   Hemoglobin 12.9 (L) 13.0 - 17.0 g/dL   HCT 38.3 (L) 39.0 - 52.0 %   MCV 87.8 78.0 - 100.0 fL   MCH 29.6 26.0 - 34.0 pg   MCHC 33.7 30.0 - 36.0 g/dL   RDW 14.1 11.5 - 15.5 %   Platelets 169 150 - 400 K/uL    Comment: Performed at [facility], [place] [address], [place], [place] [address]  Protime-INR     Status: None   Collection Time: 09/28/17  6:13 AM  Result Value Ref Range   Prothrombin Time 15.0 11.4 - 15.2 seconds   INR 1.18     Comment: Performed at [facility] [address] [place], [place] [address]  APTT     Status: None   Collection Time: 09/28/17  6:13 AM  Result Value Ref Range   aPTT 28 24 - 36 seconds    Comment: Performed at [facility] [address] [place], [place] [address]    Imaging / Studies: Dg Abd 1 View  Result Date: 09/27/2017 CLINICAL DATA:  Followup sigmoid volvulus falling rectal tube insertion. EXAM: ABDOMEN - 1  VIEW COMPARISON:  Earlier today. FINDINGS: Interval rectal tube with a significant decrease in caliber of the sigmoid colon. This previously measured 13.8 cm in diameter and currently measures 5.4 cm in diameter. There is persistent gaseous distention of the remainder the colon, filled with stool. Bilateral hip prostheses. IMPRESSION: 1. Resolved pattern of sigmoid volvulus following rectal tube insertion. 2. Continued gaseous distention and stool throughout the remainder of the colon. Electronically Signed   By: Claudie Revering M.D.   On: 09/27/2017 15:05   Dg Abdomen Acute W/chest  Result Date: 09/27/2017 CLINICAL DATA:  Severe abdominal pain for 2 days. EXAM: DG ABDOMEN ACUTE W/ 1V CHEST COMPARISON:  None. FINDINGS: Heart is upper limits normal in size. Minimal left base atelectasis. Right lung clear. No effusions. Marked gaseous distention of the colon, with large bean shaped loop of bowel in the abdomen  measuring up to 13.7 cm in diameter. This is concerning for sigmoid volvulus. Large stool burden throughout the colon. No free air. No organomegaly. IMPRESSION: Massive gaseous distention of the colon, with bowel-gas pattern concerning for sigmoid volvulus. Large stool burden throughout the colon. Electronically Signed   By: Rolm Baptise M.D.   On: 09/27/2017 10:35    Medications / Allergies: per chart  Antibiotics: Anti-infectives (From admission, onward)   Start     Dose/Rate Route Frequency Ordered Stop   09/27/17 1500  ciprofloxacin (CIPRO) IVPB 400 mg     400 mg 200 mL/hr over 60 Minutes Intravenous Every 12 hours 09/27/17 1452     09/27/17 1500  metroNIDAZOLE (FLAGYL) IVPB 500 mg     500 mg 100 mL/hr over 60 Minutes Intravenous Every 8 hours 09/27/17 1452          Note: Portions of this report may have been transcribed using voice recognition software. Every effort was made to ensure accuracy; however, inadvertent computerized transcription errors may be present.   Any transcriptional errors that result from this process are unintentional.     [name], M.D., F.A.C.S. Gastrointestinal and Minimally Invasive Surgery [facility] [address], [place], [place] [address] [phone] Main / Paging   09/28/2017

## 2017-09-29 ENCOUNTER — Encounter (HOSPITAL_COMMUNITY): Payer: Self-pay | Admitting: Internal Medicine

## 2017-09-29 DIAGNOSIS — E440 Moderate protein-calorie malnutrition: Secondary | ICD-10-CM

## 2017-09-29 LAB — CBC
HCT: 36.8 % — ABNORMAL LOW (ref 39.0–52.0)
Hemoglobin: 12.3 g/dL — ABNORMAL LOW (ref 13.0–17.0)
MCH: 29.4 pg (ref 26.0–34.0)
MCHC: 33.4 g/dL (ref 30.0–36.0)
MCV: 87.8 fL (ref 78.0–100.0)
Platelets: 158 10*3/uL (ref 150–400)
RBC: 4.19 MIL/uL — ABNORMAL LOW (ref 4.22–5.81)
RDW: 14.2 % (ref 11.5–15.5)
WBC: 7.9 10*3/uL (ref 4.0–10.5)

## 2017-09-29 MED ORDER — LEVOTHYROXINE SODIUM 100 MCG IV SOLR
25.0000 ug | Freq: Every day | INTRAVENOUS | Status: DC
  Administered 2017-09-29 – 2017-09-30 (×2): 25 ug via INTRAVENOUS
  Filled 2017-09-29 (×3): qty 5

## 2017-09-29 MED ORDER — POLYETHYLENE GLYCOL 3350 17 G PO PACK
51.0000 g | PACK | Freq: Three times a day (TID) | ORAL | Status: DC
  Administered 2017-09-29 – 2017-09-30 (×5): 51 g via ORAL
  Filled 2017-09-29 (×5): qty 3

## 2017-09-29 MED ORDER — BISACODYL 5 MG PO TBEC
10.0000 mg | DELAYED_RELEASE_TABLET | Freq: Two times a day (BID) | ORAL | Status: AC
  Administered 2017-09-29 – 2017-09-30 (×3): 10 mg via ORAL
  Filled 2017-09-29 (×3): qty 2

## 2017-09-29 NOTE — Progress Notes (Signed)
PROGRESS NOTE    Jesse [name]  IRW:[id number] DOB: [birth year] DOA: 09/27/2017 PCP: Marletta Lor, MD   Brief Narrative: Jesse [name] is a 77 y.o. male with history of chronic constipation, hypothyroidism, major depression, mild dementia, osteoarthritis.  Patient presented secondary to abdominal pain and distention.  He is found to have sigmoid volvulus and underwent sigmoidoscopy with reduction of volvulus in the emergency department.  Upon induction, GI noticed that there is colon mucosa was dusky and recommended hospital admission.   Assessment & Plan:   Principal Problem:   Volvulus of sigmoid colon s/p flex sig/rectal tube 09/27/2017 Active Problems:   Hypothyroidism   Primary osteoarthritis of right hip   Depression, major, single episode, severe ([facility])   Mild dementia   Chronic constipation   Protein-calorie malnutrition, moderate ([facility])   Sigmoid volvulus Status post reduction with sigmoidoscopy on 5/3.  Patient with continued hematochezia.  No other concerns. -GI/surgery recommendations: possible surgery in AM -Continue antibiotic prophylaxis with ciprofloxacin and Flagyl  History of atrial fibrillation Very remote history from 2013.  Currently sinus rhythm.  Dementia Outpatient management.  Mild.  Major depression Per report, Lexapro discontinued as an outpatient.  Will need to discontinue on discharge -Continue Cymbalta.    Hypothyroidism -Continue Synthroid 50 mcg daily -Synthroid 25 mcg daily starting 5/6 since patient will be NPO for possible surgery  Osteoarthritis of the right hip Stable.  Acute blood loss anemia Dropped to 3.1 g/dL to 12.9 g/dL.  Patient with associated hematochezia. Stable.   DVT prophylaxis: SCDs in setting of GI bleeding Code Status:   Code Status: Full Code Family Communication: None at bedside Disposition Plan: Discharge pending GI/surgery management and recommendations   Consultants:   Gastroenterology  General  surgery  Procedures:   Sigmoidoscopy (5/3)  Antimicrobials:  Ciprofloxacin (5/3>>  Flagyl (5/3>>   Subjective: No concerns today  Objective: Vitals:   09/28/17 1635 09/28/17 2125 09/29/17 0437 09/29/17 0925  BP: 106/72 122/82 119/64 119/78  Pulse: 66 68 65 67  Resp: 18 18 16 16   Temp: 97.8 F (36.6 C) 98.5 F (36.9 C) 97.8 F (36.6 C) 98 F (36.7 C)  TempSrc: Oral Oral Oral Oral  SpO2: 96% 97% 96% 93%  Weight:  71.3 kg (157 lb 3 oz)    Height:        Intake/Output Summary (Last 24 hours) at 09/29/2017 1119 Last data filed at 09/29/2017 0900 Gross per 24 hour  Intake 3150 ml  Output 1755 ml  Net 1395 ml   Filed Weights   09/27/17 0858 09/27/17 2034 09/28/17 2125  Weight: 71.2 kg (157 lb) 71.2 kg (156 lb 15.5 oz) 71.3 kg (157 lb 3 oz)    Examination:  General exam: Appears calm and comfortable Respiratory system: Clear to auscultation. Respiratory effort normal. Cardiovascular system: S1 & S2 heard, RRR. No murmurs. Gastrointestinal system: Abdomen is nondistended, soft and nontender. Normal bowel sounds heard. Central nervous system: Alert and oriented. No focal neurological deficits. Extremities: No edema. No calf tenderness Skin: No cyanosis. No rashes Psychiatry: Severely depressed appearing with flat affect. Withdrawn.    Data Reviewed: I have personally reviewed following labs and imaging studies  CBC: Recent Labs  Lab 09/27/17 0923 09/27/17 1416 09/28/17 0613 09/29/17 0607  WBC 11.2* 11.7* 9.6 7.9  HGB 16.3 15.1 12.9* 12.3*  HCT 46.5 44.1 38.3* 36.8*  MCV 86.8 86.5 87.8 87.8  PLT 233 196 169 761   Basic Metabolic Panel: Recent Labs  Lab 09/27/17 0923  09/27/17 1416 09/28/17 0613  NA 134*  --  134*  K 4.4  --  3.9  CL 99*  --  102  CO2 22  --  23  GLUCOSE 178*  --  100*  BUN 26*  --  20  CREATININE 0.96 0.95 0.95  CALCIUM 9.1  --  7.8*   GFR: Estimated Creatinine Clearance: 65.7 mL/min (by C-G formula based on SCr of 0.95  mg/dL). Liver Function Tests: Recent Labs  Lab 09/27/17 0923 09/28/17 0613  AST 29 22  ALT 23 16*  ALKPHOS 70 51  BILITOT 1.2 0.8  PROT 6.9 5.0*  ALBUMIN 4.1 2.9*   Recent Labs  Lab 09/27/17 0923  LIPASE 20   No results for input(s): AMMONIA in the last 168 hours. Coagulation Profile: Recent Labs  Lab 09/28/17 0613  INR 1.18   Cardiac Enzymes: No results for input(s): CKTOTAL, CKMB, CKMBINDEX, TROPONINI in the last 168 hours. BNP (last 3 results) No results for input(s): PROBNP in the last 8760 hours. HbA1C: No results for input(s): HGBA1C in the last 72 hours. CBG: No results for input(s): GLUCAP in the last 168 hours. Lipid Profile: No results for input(s): CHOL, HDL, LDLCALC, TRIG, CHOLHDL, LDLDIRECT in the last 72 hours. Thyroid Function Tests: No results for input(s): TSH, T4TOTAL, FREET4, T3FREE, THYROIDAB in the last 72 hours. Anemia Panel: No results for input(s): VITAMINB12, FOLATE, FERRITIN, TIBC, IRON, RETICCTPCT in the last 72 hours. Sepsis Labs: Recent Labs  Lab 09/27/17 1442  LATICACIDVEN 1.88    No results found for this or any previous visit (from the past 240 hour(s)).       Radiology Studies: Dg Abd 1 View  Result Date: 09/27/2017 CLINICAL DATA:  Followup sigmoid volvulus falling rectal tube insertion. EXAM: ABDOMEN - 1 VIEW COMPARISON:  Earlier today. FINDINGS: Interval rectal tube with a significant decrease in caliber of the sigmoid colon. This previously measured 13.8 cm in diameter and currently measures 5.4 cm in diameter. There is persistent gaseous distention of the remainder the colon, filled with stool. Bilateral hip prostheses. IMPRESSION: 1. Resolved pattern of sigmoid volvulus following rectal tube insertion. 2. Continued gaseous distention and stool throughout the remainder of the colon. Electronically Signed   By: Claudie Revering M.D.   On: 09/27/2017 15:05   Acute Abdominal Series  Result Date: 09/28/2017 CLINICAL DATA:  Sigmoid  volvulus. EXAM: DG ABDOMEN ACUTE W/ 1V CHEST COMPARISON:  Abdominal x-ray from yesterday. FINDINGS: Unchanged rectal tube. Mild gaseous distention of the sigmoid colon, slightly increased when compared to prior study. No definite recurrent volvulus. Large amount of stool throughout the remaining colon. Heart size and mediastinal contours are within normal limits. Both lungs are clear. IMPRESSION: 1. Slightly increased gaseous distention of the sigmoid colon without definite recurrent volvulus. 2.  No active cardiopulmonary disease. Electronically Signed   By: Titus Dubin M.D.   On: 09/28/2017 11:56        Scheduled Meds: . bisacodyl  10 mg Oral BID  . feeding supplement  1 Container Oral TID BM  . polyethylene glycol  51 g Oral TID  . saccharomyces boulardii  250 mg Oral BID  . sodium chloride flush  3 mL Intravenous Q12H   Continuous Infusions: . sodium chloride 1,000 mL (09/28/17 1710)  . cefTRIAXone (ROCEPHIN)  IV 2 g (09/29/17 1114)  . lactated ringers    . metronidazole 500 mg (09/29/17 0800)     LOS: 2 days     Cordelia Poche, MD  [facility] 09/29/2017, 11:19 AM Pager: [phone]  If 7PM-7AM, please contact night-coverage [email or web address] 09/29/2017, 11:19 AM

## 2017-09-29 NOTE — Progress Notes (Signed)
[facility]  [address] [place], [place] [address] Phone: [phone]  FAX: [phone]      Jesse [name] [id number] 02-14-1941  CARE TEAM:  PCP: Marletta Lor, MD  Outpatient Care Team: Patient Care Team: Marletta Lor, MD as PCP - General (Internal Medicine)  Inpatient Treatment Team: Treatment Team: Attending Provider: Mariel Aloe, MD; Consulting Physician: Gatha Mayer, MD; Rounding Team: Edison Pace, Md, MD; Rounding Team: Fanny Dance, MD   Problem List:   Principal Problem:   Volvulus of sigmoid colon s/p flex sig/rectal tube 09/27/2017 Active Problems:   Hypothyroidism   Primary osteoarthritis of right hip   Depression, major, single episode, severe ([place])   Mild dementia   Chronic constipation   Protein-calorie malnutrition, moderate ([place])   2 Days Post-Op  09/27/2017  Procedure(s): FLEXIBLE SIGMOIDOSCOPY decompression of sigmoid volvulus   Assessment  Stabilizing  Plan:  Pureed diet.  NPO after MN  Aggressive bowel regimen.  Rectal tube removed by me - Dr Carlean Purl agreed  Agree with antibiotics for now.  If clinically deteriorates or not improved by tomorrow, colectomy with possible ostomy versus anastomosis.  Colon volvulus is a side effect of his severe constipation.  Aggressive bowel regimen hopefully will lower that chance.  We will follow.  D/w pt & Dr Carlean Purl   -VTE prophylaxis- SCDs, etc -mobilize as tolerated to help recovery  25 minutes spent in review, evaluation, examination, counseling, and coordination of care.  More than 50% of that time was spent in counseling.  [name], M.D., F.A.C.S. Gastrointestinal and Minimally Invasive Surgery [facility] [address] [place], [place] [address] [phone] Main / Paging   09/29/2017    Subjective: (Chief complaint)  Less pain. Tol liquids Staying in  bed   Objective:  Vital signs:  Vitals:   09/28/17 1635 09/28/17 2125 09/29/17 0437 09/29/17 0925  BP: 106/72 122/82 119/64 119/78  Pulse: 66 68 65 67  Resp: '18 18 16 16  '$ Temp: 97.8 F (36.6 C) 98.5 F (36.9 C) 97.8 F (36.6 C) 98 F (36.7 C)  TempSrc: Oral Oral Oral Oral  SpO2: 96% 97% 96% 93%  Weight:  71.3 kg (157 lb 3 oz)    Height:        Last BM Date: 09/28/17  Intake/Output   Yesterday:  05/04 0701 - 05/05 0700 In: 3150 [P.O.:1200; I.V.:1750; IV Piggyback:200] Out: 1157 [Urine:1455] This shift:  Total I/O In: 0  Out: 300 [Urine:300]  Bowel function:  Flatus: YES  BM:  YES  Drain: Feculent rectal tube - I removed   Physical Exam:  General: Pt awake/alert/oriented x4 in no acute distress Eyes: PERRL, normal EOM.  Sclera clear.  No icterus Neuro: CN II-XII intact w/o focal sensory/motor deficits. Lymph: No head/neck/groin lymphadenopathy Psych:  No delerium/psychosis/paranoia HENT: Normocephalic, Mucus membranes moist.  No thrush Neck: Supple, No tracheal deviation Chest: No chest wall pain w good excursion CV:  Pulses intact.  Regular rhythm MS: Normal AROM mjr joints.  No obvious deformity  Abdomen: Soft.  Moderately distended. - improved Nontender.  No evidence of peritonitis.  No incarcerated hernias.  Ext:   No deformity.  No mjr edema.  No cyanosis Skin: No petechiae / purpura  Results:   Labs: Results for orders placed or performed during the hospital encounter of 09/27/17 (from the past 48 hour(s))  CBC     Status: Abnormal   Collection Time: 09/27/17  2:16 PM  Result Value Ref Range   WBC 11.7 (H) 4.0 - 10.5 K/uL   RBC 5.10 4.22 - 5.81 MIL/uL   Hemoglobin 15.1 13.0 - 17.0 g/dL   HCT 44.1 39.0 - 52.0 %   MCV 86.5 78.0 - 100.0 fL   MCH 29.6 26.0 - 34.0 pg   MCHC 34.2 30.0 - 36.0 g/dL   RDW 13.5 11.5 - 15.5 %   Platelets 196 150 - 400 K/uL    Comment: Performed at [facility] [address] [place], [place] [address]   Creatinine, serum     Status: None   Collection Time: 09/27/17  2:16 PM  Result Value Ref Range   Creatinine, Ser 0.95 0.61 - 1.24 mg/dL   GFR calc non Af Amer >60 >60 mL/min   GFR calc Af Amer >60 >60 mL/min    Comment: (NOTE) The eGFR has been calculated using the CKD EPI equation. This calculation has not been validated in all clinical situations. eGFR's persistently <60 mL/min signify possible Chronic Kidney Disease. Performed at [facility], [place] [address], [place], [place] [address]   I-Stat CG4 Lactic Acid, ED     Status: None   Collection Time: 09/27/17  2:42 PM  Result Value Ref Range   Lactic Acid, Venous 1.88 0.5 - 1.9 mmol/L  Urinalysis, Routine w reflex microscopic     Status: Abnormal   Collection Time: 09/27/17  8:44 PM  Result Value Ref Range   Color, Urine YELLOW YELLOW   APPearance HAZY (A) CLEAR   Specific Gravity, Urine 1.028 1.005 - 1.030   pH 5.0 5.0 - 8.0   Glucose, UA 150 (A) NEGATIVE mg/dL   Hgb urine dipstick NEGATIVE NEGATIVE   Bilirubin Urine NEGATIVE NEGATIVE   Ketones, ur 20 (A) NEGATIVE mg/dL   Protein, ur 30 (A) NEGATIVE mg/dL   Nitrite NEGATIVE NEGATIVE   Leukocytes, UA NEGATIVE NEGATIVE   RBC / HPF 0-5 0 - 5 RBC/hpf   WBC, UA 0-5 0 - 5 WBC/hpf   Bacteria, UA NONE SEEN NONE SEEN   Mucus PRESENT     Comment: Performed at [facility], [address] [place], [place] [address]  Comprehensive metabolic panel     Status: Abnormal   Collection Time: 09/28/17  6:13 AM  Result Value Ref Range   Sodium 134 (L) 135 - 145 mmol/L   Potassium 3.9 3.5 - 5.1 mmol/L   Chloride 102 101 - 111 mmol/L   CO2 23 22 - 32 mmol/L   Glucose, Bld 100 (H) 65 - 99 mg/dL   BUN 20 6 - 20 mg/dL   Creatinine, Ser 0.95 0.61 - 1.24 mg/dL   Calcium 7.8 (L) 8.9 - 10.3 mg/dL   Total Protein 5.0 (L) 6.5 - 8.1 g/dL   Albumin 2.9 (L) 3.5 - 5.0 g/dL   AST 22 15 - 41 U/L   ALT 16 (L) 17 - 63 U/L   Alkaline Phosphatase 51 38 - 126 U/L   Total Bilirubin 0.8  0.3 - 1.2 mg/dL   GFR calc non Af Amer >60 >60 mL/min   GFR calc Af Amer >60 >60 mL/min    Comment: (NOTE) The eGFR has been calculated using the CKD EPI equation. This calculation has not been validated in all clinical situations. eGFR's persistently <60 mL/min signify possible Chronic Kidney Disease.    Anion gap 9 5 - 15    Comment: Performed at [facility],  [address] [place], [place] [address]  CBC     Status: Abnormal   Collection Time: 09/28/17  6:13 AM  Result Value Ref Range   WBC 9.6 4.0 - 10.5 K/uL   RBC 4.36 4.22 - 5.81 MIL/uL   Hemoglobin 12.9 (L) 13.0 - 17.0 g/dL   HCT 38.3 (L) 39.0 - 52.0 %   MCV 87.8 78.0 - 100.0 fL   MCH 29.6 26.0 - 34.0 pg   MCHC 33.7 30.0 - 36.0 g/dL   RDW 14.1 11.5 - 15.5 %   Platelets 169 150 - 400 K/uL    Comment: Performed at [facility], [place] [address] [place], [place] [address]  Protime-INR     Status: None   Collection Time: 09/28/17  6:13 AM  Result Value Ref Range   Prothrombin Time 15.0 11.4 - 15.2 seconds   INR 1.18     Comment: Performed at [facility] [address], [place], [place] [address]  APTT     Status: None   Collection Time: 09/28/17  6:13 AM  Result Value Ref Range   aPTT 28 24 - 36 seconds    Comment: Performed at [facility] [address], [place], [place] [address]  CBC     Status: Abnormal   Collection Time: 09/29/17  6:07 AM  Result Value Ref Range   WBC 7.9 4.0 - 10.5 K/uL   RBC 4.19 (L) 4.22 - 5.81 MIL/uL   Hemoglobin 12.3 (L) 13.0 - 17.0 g/dL   HCT 36.8 (L) 39.0 - 52.0 %   MCV 87.8 78.0 - 100.0 fL   MCH 29.4 26.0 - 34.0 pg   MCHC 33.4 30.0 - 36.0 g/dL   RDW 14.2 11.5 - 15.5 %   Platelets 158 150 - 400 K/uL    Comment: Performed at [facility], [place] [address], [place], [place] [address]    Imaging / Studies: Dg Abd 1 View  Result Date: 09/27/2017 CLINICAL DATA:  Followup sigmoid volvulus falling rectal tube insertion. EXAM: ABDOMEN - 1 VIEW COMPARISON:   Earlier today. FINDINGS: Interval rectal tube with a significant decrease in caliber of the sigmoid colon. This previously measured 13.8 cm in diameter and currently measures 5.4 cm in diameter. There is persistent gaseous distention of the remainder the colon, filled with stool. Bilateral hip prostheses. IMPRESSION: 1. Resolved pattern of sigmoid volvulus following rectal tube insertion. 2. Continued gaseous distention and stool throughout the remainder of the colon. Electronically Signed   By: Claudie Revering M.D.   On: 09/27/2017 15:05   Acute Abdominal Series  Result Date: 09/28/2017 CLINICAL DATA:  Sigmoid volvulus. EXAM: DG ABDOMEN ACUTE W/ 1V CHEST COMPARISON:  Abdominal x-ray from yesterday. FINDINGS: Unchanged rectal tube. Mild gaseous distention of the sigmoid colon, slightly increased when compared to prior study. No definite recurrent volvulus. Large amount of stool throughout the remaining colon. Heart size and mediastinal contours are within normal limits. Both lungs are clear. IMPRESSION: 1. Slightly increased gaseous distention of the sigmoid colon without definite recurrent volvulus. 2.  No active cardiopulmonary disease. Electronically Signed   By: Titus Dubin M.D.   On: 09/28/2017 11:56   Dg Abdomen Acute W/chest  Result Date: 09/27/2017 CLINICAL DATA:  Severe abdominal pain for 2 days. EXAM: DG ABDOMEN ACUTE W/ 1V CHEST COMPARISON:  None. FINDINGS: Heart is upper limits normal in size. Minimal left base atelectasis. Right lung clear. No effusions. Marked gaseous distention of the  colon, with large bean shaped loop of bowel in the abdomen measuring up to 13.7 cm in diameter. This is concerning for sigmoid volvulus. Large stool burden throughout the colon. No free air. No organomegaly. IMPRESSION: Massive gaseous distention of the colon, with bowel-gas pattern concerning for sigmoid volvulus. Large stool burden throughout the colon. Electronically Signed   By: Rolm Baptise M.D.   On:  09/27/2017 10:35    Medications / Allergies: per chart  Antibiotics: Anti-infectives (From admission, onward)   Start     Dose/Rate Route Frequency Ordered Stop   09/28/17 1100  cefTRIAXone (ROCEPHIN) 2 g in sodium chloride 0.9 % 100 mL IVPB    Note to Pharmacy:  Pharmacy may adjust dosing strength / duration / interval for maximal efficacy   2 g 200 mL/hr over 30 Minutes Intravenous Every 24 hours 09/28/17 1013     09/27/17 1500  ciprofloxacin (CIPRO) IVPB 400 mg  Status:  Discontinued     400 mg 200 mL/hr over 60 Minutes Intravenous Every 12 hours 09/27/17 1452 09/28/17 1013   09/27/17 1500  metroNIDAZOLE (FLAGYL) IVPB 500 mg     500 mg 100 mL/hr over 60 Minutes Intravenous Every 8 hours 09/27/17 1452          Note: Portions of this report may have been transcribed using voice recognition software. Every effort was made to ensure accuracy; however, inadvertent computerized transcription errors may be present.   Any transcriptional errors that result from this process are unintentional.     [name], M.D., F.A.C.S. Gastrointestinal and Minimally Invasive Surgery [facility] [address], [place], [place] [address] [phone] Main / Paging   09/29/2017

## 2017-09-29 NOTE — Progress Notes (Signed)
Patient had a very large amount of stool,900 ml level of white hat catcher.It was light brown color,mix with soft formed and liquid stool.

## 2017-09-29 NOTE — Evaluation (Signed)
Physical Therapy Evaluation Patient Details Name: Jesse [name] MRN: [id number] DOB: 17-Jul-1940 Today's Date: 09/29/2017   History of Present Illness   Pt is a 77 y.o. male with medical history significant of chronic constipation, hypothyroidism, recent major depression, mild dementia, episode of atrial fibrillation, and OA of the right hip s/p THA 06/2016. He presented to the emergency department with his wife due to abdominal pain and distention. He was admitted for volvulus of sigmoid colon which was a result of chronic constipation.     Clinical Impression  Pt admitted with above diagnosis. Pt currently with functional limitations due to the deficits listed below (see PT Problem List). PTA pt lived at home with his wife, independent with mobility. On eval, he required supervision bed mobility, min guard assist transfers, and min guard assist ambulation 25 feet with RW. Gait distance limited to in room due to pt just returning from ambulating in hallway with wife. She reports he walked 3 lengths of the hall.  Pt will benefit from skilled PT to increase their independence and safety with mobility to allow discharge to the venue listed below.  PT to follow acutely. No follow up services or DME indicated.      Follow Up Recommendations No PT follow up;Supervision for mobility/OOB    Equipment Recommendations  None recommended by PT    Recommendations for Other Services       Precautions / Restrictions Precautions Precautions: None      Mobility  Bed Mobility Overal bed mobility: Needs Assistance Bed Mobility: Supine to Sit;Sit to Supine     Supine to sit: Supervision;HOB elevated Sit to supine: Supervision;HOB elevated   General bed mobility comments: supervision for safety, +rail  Transfers Overall transfer level: Needs assistance Equipment used: Rolling walker (2 wheeled) Transfers: Sit to/from Stand Sit to Stand: Min guard;From elevated surface             Ambulation/Gait Ambulation/Gait assistance: Min guard Ambulation Distance (Feet): 25 Feet Assistive device: Rolling walker (2 wheeled) Gait Pattern/deviations: Step-through pattern;Decreased stride length Gait velocity: mildly decreased Gait velocity interpretation: 1.31 - 2.62 ft/sec, indicative of limited community ambulator General Gait Details: Gait limited to in room due to pt just returning from ambulating in hallway with wife.  Stairs            Wheelchair Mobility    Modified Rankin (Stroke Patients Only)       Balance                                             Pertinent Vitals/Pain Pain Assessment: No/denies pain    Home Living Family/patient expects to be discharged to:: Private residence Living Arrangements: Spouse/significant other Available Help at Discharge: Family;Available 24 hours/day Type of Home: House Home Access: Stairs to enter Entrance Stairs-Rails: None Entrance Stairs-Number of Steps: 2 Home Layout: Laundry or work area in basement;One level(17 steps to basement. Pt likes spending time in the basement den. ) Home Equipment: Gilford Rile - 2 wheels;Cane - quad;Bedside commode;Shower seat      Prior Function Level of Independence: Independent               Hand Dominance        Extremity/Trunk Assessment   Upper Extremity Assessment Upper Extremity Assessment: Overall WFL for tasks assessed    Lower Extremity Assessment Lower Extremity Assessment: Overall WFL for tasks  assessed       Communication   Communication: HOH  Cognition Arousal/Alertness: Awake/alert Behavior During Therapy: Flat affect Overall Cognitive Status: Within Functional Limits for tasks assessed                                 General Comments: depression. Off meds during hospitalization      General Comments      Exercises     Assessment/Plan    PT Assessment Patient needs continued PT services  PT Problem  List Decreased mobility;Decreased activity tolerance       PT Treatment Interventions Therapeutic activities;Gait training;Therapeutic exercise;Patient/family education;Stair training;Balance training;Functional mobility training    PT Goals (Current goals can be found in the Care Plan section)  Acute Rehab PT Goals Patient Stated Goal: home PT Goal Formulation: With patient/family Time For Goal Achievement: 10/13/17 Potential to Achieve Goals: Good    Frequency Min 3X/week   Barriers to discharge        Co-evaluation               AM-PAC PT "6 Clicks" Daily Activity  Outcome Measure Difficulty turning over in bed (including adjusting bedclothes, sheets and blankets)?: None Difficulty moving from lying on back to sitting on the side of the bed? : A Little Difficulty sitting down on and standing up from a chair with arms (e.g., wheelchair, bedside commode, etc,.)?: A Little Help needed moving to and from a bed to chair (including a wheelchair)?: A Little Help needed walking in hospital room?: A Little Help needed climbing 3-5 steps with a railing? : A Little 6 Click Score: 19    End of Session Equipment Utilized During Treatment: Gait belt Activity Tolerance: Patient tolerated treatment well Patient left: in chair;with call bell/phone within reach;with family/visitor present Nurse Communication: Mobility status PT Visit Diagnosis: Difficulty in walking, not elsewhere classified (R26.2)    Time: 1250-1302 PT Time Calculation (min) (ACUTE ONLY): 12 min   Charges:   PT Evaluation $PT Eval Low Complexity: 1 Low     PT G Codes:        Lorrin Goodell, PT  Office # [phone] Pager [phone]   Lorriane Shire 09/29/2017, 1:34 PM

## 2017-09-29 NOTE — Progress Notes (Signed)
Patient ambulated seven times on the hallway.Had two timed bowel movement.

## 2017-09-29 NOTE — Progress Notes (Signed)
   Patient Name: Jesse [name] Date of Encounter: 09/29/2017, 11:03 AM    Subjective  Seems same-better Denies abdominal pain  + stool output into rectal bag - sl bloody  On clear liquids   Objective  BP 119/78 (BP Location: Left Arm)   Pulse 67   Temp 98 F (36.7 C) (Oral)   Resp 16   Ht 5\' 11"  (1.803 m)   Wt 157 lb 3 oz (71.3 kg)   SpO2 93%   BMI 21.92 kg/m  Awake, alert, answers ? Appropriately abd mod distended soft, NT BS + Rectal tube removed by Dr. Johney Maine   Assessment and Plan  Sigmoid volvulus w/ischemia - improved Chronic constipation  Rectal tube dced Seen w/ Dr. Johney Maine - plan for ambulate, MiraLAx,, NPO in AM and check xray - if not progressing may have surgery. Continue prophylactic Abx for now  We will f/u to see if we need to decompress again   Gatha Mayer, MD, [facility] 09/29/2017 11:03 AM

## 2017-09-30 ENCOUNTER — Inpatient Hospital Stay (HOSPITAL_COMMUNITY): Payer: Medicare Other

## 2017-09-30 ENCOUNTER — Telehealth: Payer: Self-pay | Admitting: Nurse Practitioner

## 2017-09-30 DIAGNOSIS — R918 Other nonspecific abnormal finding of lung field: Secondary | ICD-10-CM

## 2017-09-30 DIAGNOSIS — R140 Abdominal distension (gaseous): Secondary | ICD-10-CM

## 2017-09-30 LAB — CBC
HCT: 34.6 % — ABNORMAL LOW (ref 39.0–52.0)
Hemoglobin: 11.5 g/dL — ABNORMAL LOW (ref 13.0–17.0)
MCH: 29.1 pg (ref 26.0–34.0)
MCHC: 33.2 g/dL (ref 30.0–36.0)
MCV: 87.6 fL (ref 78.0–100.0)
Platelets: 158 10*3/uL (ref 150–400)
RBC: 3.95 MIL/uL — ABNORMAL LOW (ref 4.22–5.81)
RDW: 14 % (ref 11.5–15.5)
WBC: 4.8 10*3/uL (ref 4.0–10.5)

## 2017-09-30 MED ORDER — ENSURE PRE-SURGERY PO LIQD
296.0000 mL | Freq: Once | ORAL | Status: DC
  Filled 2017-09-30: qty 296

## 2017-09-30 MED ORDER — ENSURE SURGERY PO LIQD
237.0000 mL | Freq: Two times a day (BID) | ORAL | Status: DC
  Filled 2017-09-30 (×2): qty 237

## 2017-09-30 MED ORDER — GABAPENTIN 100 MG PO CAPS: 100.0000 mg | ORAL_CAPSULE | ORAL | Status: DC

## 2017-09-30 MED ORDER — CHLORHEXIDINE GLUCONATE 4 % EX LIQD
60.0000 mL | Freq: Once | CUTANEOUS | Status: DC
  Filled 2017-09-30: qty 60

## 2017-09-30 MED ORDER — ACETAMINOPHEN 500 MG PO TABS: 1000.0000 mg | ORAL_TABLET | ORAL | Status: DC

## 2017-09-30 MED ORDER — SODIUM CHLORIDE 0.9 % IV SOLN: 2.0000 g | INTRAVENOUS | Status: DC

## 2017-09-30 NOTE — Progress Notes (Signed)
Wife and patient decided not to leave AMA after the unit's AD explained  to them the importance of staying. Called IV team to get IV restarted.

## 2017-09-30 NOTE — Progress Notes (Signed)
OT Cancellation Note  Patient Details Name: Jesse [name] MRN: [id number] DOB: [birth year]   Cancelled Treatment:    Reason Eval/Treat Not Completed: Patient at procedure or test/ unavailable  Malka So 09/30/2017, 8:55 AM

## 2017-09-30 NOTE — Progress Notes (Signed)
Physical Therapy Treatment Patient Details Name: Jesse [name] MRN: [id number] DOB: 02-Feb-1941 Today's Date: 09/30/2017    History of Present Illness  Pt is a 77 y.o. male with medical history significant of chronic constipation, hypothyroidism, recent major depression, mild dementia, episode of atrial fibrillation, and OA of the right hip s/p THA 06/2016. He presented to the emergency department with his wife due to abdominal pain and distention. He was admitted for volvulus of sigmoid colon which was a result of chronic constipation.     PT Comments    Pt mobilizing well with wife providing assist with IV pole. PT will continue to follow in acute care until decision is made regarding surgery. If pt undergoes GI sx, PT will assess mobility following procedure. If it is determined surgery is not indicated, PT will sign off as wife is able to provide needed level of assist.    Follow Up Recommendations  No PT follow up;Supervision for mobility/OOB     Equipment Recommendations  None recommended by PT    Recommendations for Other Services       Precautions / Restrictions Precautions Precautions: None Restrictions Weight Bearing Restrictions: No    Mobility  Bed Mobility               General bed mobility comments: pt OOB upon arrival  Transfers Overall transfer level: Needs assistance Equipment used: Ambulation equipment used Transfers: Sit to/from Stand Sit to Stand: Supervision         General transfer comment: supervision for safety and IV line  Ambulation/Gait Ambulation/Gait assistance: Supervision Ambulation Distance (Feet): 500 Feet Assistive device: Rolling walker (2 wheeled) Gait Pattern/deviations: Decreased stride length;Step-through pattern Gait velocity: slow cadence Gait velocity interpretation: 1.31 - 2.62 ft/sec, indicative of limited community ambulator General Gait Details: steady Radio broadcast assistant     Modified Rankin (Stroke Patients Only)       Balance Overall balance assessment: Mild deficits observed, not formally tested                                          Cognition Arousal/Alertness: Awake/alert Behavior During Therapy: Flat affect Overall Cognitive Status: Within Functional Limits for tasks assessed                                 General Comments: pt with depression, wife answers questions for him      Exercises      General Comments        Pertinent Vitals/Pain Pain Assessment: No/denies pain Faces Pain Scale: Hurts little more Pain Location: abdomen Pain Descriptors / Indicators: Pressure Pain Intervention(s): Monitored during session;Repositioned    Home Living Family/patient expects to be discharged to:: Private residence Living Arrangements: Spouse/significant other Available Help at Discharge: Family;Available 24 hours/day Type of Home: House Home Access: Stairs to enter Entrance Stairs-Rails: None Home Layout: Laundry or work area in basement;One level(pt spends time in basement den) Home Equipment: Gilford Rile - 2 wheels;Cane - quad;Bedside commode;Shower seat      Prior Function Level of Independence: Independent          PT Goals (current goals can now be found in the care plan section) Acute Rehab PT Goals Patient Stated Goal: home PT Goal Formulation:  With patient/family Time For Goal Achievement: 10/13/17 Potential to Achieve Goals: Good Progress towards PT goals: Progressing toward goals    Frequency    Min 3X/week      PT Plan Current plan remains appropriate    Co-evaluation              AM-PAC PT "6 Clicks" Daily Activity  Outcome Measure  Difficulty turning over in bed (including adjusting bedclothes, sheets and blankets)?: None Difficulty moving from lying on back to sitting on the side of the bed? : A Little Difficulty sitting down on and standing up from a chair with arms  (e.g., wheelchair, bedside commode, etc,.)?: A Little Help needed moving to and from a bed to chair (including a wheelchair)?: None Help needed walking in hospital room?: None Help needed climbing 3-5 steps with a railing? : A Little 6 Click Score: 21    End of Session   Activity Tolerance: Patient tolerated treatment well Patient left: in chair;with call bell/phone within reach;with family/visitor present Nurse Communication: Mobility status PT Visit Diagnosis: Difficulty in walking, not elsewhere classified (R26.2)     Time: 1610-9604 PT Time Calculation (min) (ACUTE ONLY): 13 min  Charges:  $Gait Training: 8-22 mins                    G Codes:       Lorrin Goodell, PT  Office # [phone] Pager [phone]    Lorriane Shire 09/30/2017, 12:31 PM

## 2017-09-30 NOTE — Progress Notes (Signed)
Patient ID: Jesse [name], male   DOB: [birth year], 77 y.o.   MRN: [id number]   Acute Care Surgery Service Progress Note:    Chief Complaint/Subjective: I rounded on patient early this morning however his wife was not present.  I came back this afternoon when she arrived.  He reports flatus and liquid bowel movements but also reports some upper abdominal bloating.  He does not talk much.  Objective: Vital signs in last 24 hours: Temp:  [97.7 F (36.5 C)-98.7 F (37.1 C)] 97.8 F (36.6 C) (05/06 1048) Pulse Rate:  [65-78] 68 (05/06 1048) Resp:  [16-20] 16 (05/06 1048) BP: (110-131)/(69-85) 127/85 (05/06 1048) SpO2:  [95 %-97 %] 97 % (05/06 1048) Weight:  [72.9 kg (160 lb 11.5 oz)-75.3 kg (166 lb 0.1 oz)] 72.9 kg (160 lb 11.5 oz) (05/06 0600) Last BM Date: 09/29/17  Intake/Output from previous day: 05/05 0701 - 05/06 0700 In: 1962 [P.O.:450; I.V.:1412; IV Piggyback:100] Out: 2760 [Urine:1510; Stool:1250] Intake/Output this shift: Total I/O In: 491.7 [P.O.:25; I.V.:466.7] Out: -   Lungs: cta, nonlabored  Cardiovascular: reg  Abd: Soft, minimal tenderness, some distention, no rebound or guarding  Extremities: no edema, +SCDs  Neuro: alert, nonfocal  Lab Results: CBC  Recent Labs    09/29/17 0607 09/30/17 0457  WBC 7.9 4.8  HGB 12.3* 11.5*  HCT 36.8* 34.6*  PLT 158 158   BMET Recent Labs    09/28/17 0613  NA 134*  K 3.9  CL 102  CO2 23  GLUCOSE 100*  BUN 20  CREATININE 0.95  CALCIUM 7.8*   LFT Hepatic Function Latest Ref Rng & Units 09/28/2017 09/27/2017 06/13/2017  Total Protein 6.5 - 8.1 g/dL 5.0(L) 6.9 6.6  Albumin 3.5 - 5.0 g/dL 2.9(L) 4.1 4.1  AST 15 - 41 U/L '22 29 30  '$ ALT 17 - 63 U/L 16(L) 23 15(L)  Alk Phosphatase 38 - 126 U/L 51 70 66  Total Bilirubin 0.3 - 1.2 mg/dL 0.8 1.2 0.6   PT/INR Recent Labs    09/28/17 0613  LABPROT 15.0  INR 1.18   ABG No results for input(s): PHART, HCO3 in the last 72 hours.  Invalid input(s): PCO2,  PO2  Studies/Results:  Anti-infectives: Anti-infectives (From admission, onward)   Start     Dose/Rate Route Frequency Ordered Stop   09/28/17 1100  cefTRIAXone (ROCEPHIN) 2 g in sodium chloride 0.9 % 100 mL IVPB    Note to Pharmacy:  Pharmacy may adjust dosing strength / duration / interval for maximal efficacy   2 g 200 mL/hr over 30 Minutes Intravenous Every 24 hours 09/28/17 1013     09/27/17 1500  ciprofloxacin (CIPRO) IVPB 400 mg  Status:  Discontinued     400 mg 200 mL/hr over 60 Minutes Intravenous Every 12 hours 09/27/17 1452 09/28/17 1013   09/27/17 1500  metroNIDAZOLE (FLAGYL) IVPB 500 mg     500 mg 100 mL/hr over 60 Minutes Intravenous Every 8 hours 09/27/17 1452        Medications: Scheduled Meds: . feeding supplement  1 Container Oral TID BM  . levothyroxine  25 mcg Intravenous Daily  . polyethylene glycol  51 g Oral TID  . saccharomyces boulardii  250 mg Oral BID  . sodium chloride flush  3 mL Intravenous Q12H   Continuous Infusions: . sodium chloride 1,000 mL (09/30/17 1015)  . cefTRIAXone (ROCEPHIN)  IV Stopped (09/30/17 1308)  . metronidazole Stopped (09/30/17 1211)   PRN Meds:.HYDROmorphone (DILAUDID) injection, simethicone  Assessment/Plan: Patient  Active Problem List   Diagnosis Date Noted  . Abdominal distension   . Protein-calorie malnutrition, moderate ([place]) 09/29/2017  . Volvulus of sigmoid colon s/p flex sig/rectal tube 09/27/2017 09/27/2017  . Mild dementia 09/27/2017  . Chronic constipation 09/27/2017  . Cognitive impairment 08/27/2017  . Viral URI with cough 07/01/2017  . Depression, major, single episode, severe ([place]) 12/14/2016  . Primary osteoarthritis of right hip 07/04/2016  . Vitiligo 06/11/2016  . Osteoarthritis of right hip 06/05/2016  . Malignant neoplasm of prostate ([place]) 12/07/2015  . Hypothyroidism 12/07/2015   s/p Procedure(s): FLEXIBLE SIGMOIDOSCOPY 09/27/2017  Sigmoid volvulus Mild dementia Anemia Depression Mild to  moderate protein calorie malnutrition  I had an extensive 25-minute conversation with the patient and his wife regarding sigmoid volvulus.  We discussed the etiology, work-up, management as well as definitive treatment.  I showed them imaging.  In my opinion the patient needs sigmoid colectomy with colostomy for definitive management.  His abdominal distention is not resolving.  I do not think he necessarily has recurrent volvulus but I think he is very high risk for recurrent volvulus.  We discussed that there is up to a 40% chance of recurrent volvulus after decompression.  They are primarily interested in him returning to his prehospital functional status as quickly as possible.  I think given his abdominal exam and their desire as well as the natural history of sigmoid volvulus I think the best option would be surgical resection.  I do not believe a primary anastomosis in the setting would be appropriate considering his moderate protein calorie malnutrition.  I think there could also be a size mismatch between the proximal colon and the rectal stump therefore I recommended sigmoid colectomy with end colostomy  I discussed the procedure in detail.   We discussed the risks and benefits of surgery including, but not limited to bleeding, infection (such as wound infection, abdominal abscess), injury to surrounding structures, blood clot formation, urinary retention, incisional hernia, colostomy issues such as retraction, ischemia, prolapse, parastomal hernia, anesthesia risks, pulmonary & cardiac complications such as pneumonia &/or heart attack, need for additional procedures, ileus, & prolonged hospitalization.  We discussed the typical postoperative recovery course, including limitations & restrictions postoperatively. I explained that the likelihood of improvement in their symptoms is good.  They are going to discuss and let us know how to proceed In the interim we will go ahead and have him marked by  wound care nurse since surgery would more than likely be tomorrow morning if they agree to proceed  We will let him have clear liquids as well as Ensure surgery shakes N.p.o. except meds 3 hours prior to surgery Type and cross Subcutaneous heparin on-call to surgery for DVT prophylaxis  Leighton Ruff. Redmond Pulling, MD, FACS General, Bariatric, & Minimally Invasive Surgery [facility], [place]   Disposition:  LOS: 3 days    Leighton Ruff. Redmond Pulling, MD, FACS General, Bariatric, & Minimally Invasive Surgery [phone] [facility]

## 2017-09-30 NOTE — Progress Notes (Addendum)
Daily Rounding Note  09/30/2017, 9:28 AM  LOS: 3 days   SUBJECTIVE:   Passing gas and liquid stool.     No abd pain or nausea.    OBJECTIVE:         Vital signs in last 24 hours:    Temp:  [97.7 F (36.5 C)-98.7 F (37.1 C)] 97.7 F (36.5 C) (05/06 0457) Pulse Rate:  [65-78] 65 (05/06 0457) Resp:  [18-20] 19 (05/06 0457) BP: (110-131)/(69-75) 131/75 (05/06 0457) SpO2:  [95 %-97 %] 95 % (05/06 0457) Weight:  [160 lb 11.5 oz (72.9 kg)-166 lb 0.1 oz (75.3 kg)] 160 lb 11.5 oz (72.9 kg) (05/06 0600) Last BM Date: 09/29/17 Filed Weights   09/29/17 2235 09/30/17 0500 09/30/17 0600  Weight: 166 lb 0.1 oz (75.3 kg) 166 lb 0.1 oz (75.3 kg) 160 lb 11.5 oz (72.9 kg)   General: moderately ill looking.  Resting comfortably   Heart: RRR Chest: no cough or labored breathing, overall reduced BS, no adventitious sounds Abdomen: distended, moderately tense, NT.  BS absent but no tinklilng or tympanitis sounds  Extremities: no CCE Neuro/Psych:  Oriented x 3.  No asterixis.    Intake/Output from previous day: 05/05 0701 - 05/06 0700 In: 1962 [P.O.:450; I.V.:1412; IV Piggyback:100] Out: 2760 [Urine:1510; Stool:1250]  Intake/Output this shift: No intake/output data recorded.  Lab Results: Recent Labs    09/28/17 0613 09/29/17 0607 09/30/17 0457  WBC 9.6 7.9 4.8  HGB 12.9* 12.3* 11.5*  HCT 38.3* 36.8* 34.6*  PLT 169 158 158   BMET Recent Labs    09/27/17 1416 09/28/17 0613  NA  --  134*  K  --  3.9  CL  --  102  CO2  --  23  GLUCOSE  --  100*  BUN  --  20  CREATININE 0.95 0.95  CALCIUM  --  7.8*   LFT Recent Labs    09/28/17 0613  PROT 5.0*  ALBUMIN 2.9*  AST 22  ALT 16*  ALKPHOS 51  BILITOT 0.8   PT/INR Recent Labs    09/28/17 0613  LABPROT 15.0  INR 1.18    Studies/Results: Acute Abdominal Series  Result Date: 09/28/2017 CLINICAL DATA:  Sigmoid volvulus. EXAM: DG ABDOMEN ACUTE W/ 1V CHEST  COMPARISON:  Abdominal x-ray from yesterday. FINDINGS: Unchanged rectal tube. Mild gaseous distention of the sigmoid colon, slightly increased when compared to prior study. No definite recurrent volvulus. Large amount of stool throughout the remaining colon. Heart size and mediastinal contours are within normal limits. Both lungs are clear. IMPRESSION: 1. Slightly increased gaseous distention of the sigmoid colon without definite recurrent volvulus. 2.  No active cardiopulmonary disease. Electronically Signed   By: Titus Dubin M.D.   On: 09/28/2017 11:56   Scheduled Meds: . bisacodyl  10 mg Oral BID  . feeding supplement  1 Container Oral TID BM  . levothyroxine  25 mcg Intravenous Daily  . polyethylene glycol  51 g Oral TID  . saccharomyces boulardii  250 mg Oral BID  . sodium chloride flush  3 mL Intravenous Q12H   Continuous Infusions: . sodium chloride 1,000 mL (09/29/17 1542)  . cefTRIAXone (ROCEPHIN)  IV Stopped (09/29/17 1141)  . lactated ringers    . metronidazole Stopped (09/30/17 0110)   PRN Meds:.HYDROmorphone (DILAUDID) injection, lactated ringers, simethicone  ASSESMENT:   *  Sigmoid volvulus.  Improved with flex sig, rectal tube.  Day 4 proph abx.  Bisacodyl,  double dose TID Miralax in place.    Repeat chest and belly films ordered.   Surgery following.    *  Chronic constipation.  Interestingly not constipated prior to onset of volvulus.    *   Hypoalbuminemia.     PLAN   *  Await official reading on AAS but to my eye looks to have persistent ileus/obstructive pattern and some pulm infiltrates.      Azucena Freed  09/30/2017, 9:28 AM Phone [phone]   Attending physician's note   I have taken an interval history, reviewed the chart and examined the patient. I agree with the Advanced Practitioner's note, impression and recommendations.   Sigmoid volvulus status post flex sig with decompression.  He is passing flatus and also had bowel movement .  On exam  abdomen is mildly distended, soft and tympanic anteriorly and sub-active bowel sounds.  Abdominal x-ray showed distended bowel and sigmoid colon suggestive of ileus Continue bowel regimen Turn in bed every 3-4 hours and out of bed as tolerated We will sign off, available for any questions  K. Denzil Magnuson , MD [phone]

## 2017-09-30 NOTE — Telephone Encounter (Signed)
See note below

## 2017-09-30 NOTE — Consult Note (Signed)
[facility] Nurse requested for preoperative stoma site marking  Discussed surgical procedure and stoma creation with patient and family.  Explained role of the [facility] nurse team.  Provided the patient with educational booklet and provided samples of pouching options.  Answered patient and family questions.  Patient apprehensive that this meant that he was definitely going to need an ostomy.  I provided emotional support and that the decision may be made in surgery.  Furthermore, preoperative marking ensured the best possible stoma location if a stoma is indicated.  He is hesitant agreeable.      Examined patient lying, sitting, but unable to stand.   in order to place the marking in the patient's visual field, away from any creases or abdominal contour issues and within the rectus muscle.    Marked for colostomy in the LUQ  3  cm to the left of the umbilicus and 2 cm above the umbilicus.   Patient's abdomen cleansed with CHG wipes at site markings, allowed to air dry prior to marking.Covered mark with thin film transparent dressing to preserve mark until date of surgery.   [facility] Nurse team will follow up with patient after surgery for continue ostomy care and teaching.   Domenic Moras RN BSN [place] Pager [phone]

## 2017-09-30 NOTE — Progress Notes (Signed)
PROGRESS NOTE    Jesse [name]  HER:[id number] DOB: 03-Dec-1940 DOA: 09/27/2017 PCP: Marletta Lor, MD   Brief Narrative: Jesse [name] is a 77 y.o. male with history of chronic constipation, hypothyroidism, major depression, mild dementia, osteoarthritis.  Patient presented secondary to abdominal pain and distention.  He is found to have sigmoid volvulus and underwent sigmoidoscopy with reduction of volvulus in the emergency department.  Upon induction, GI noticed that there is colon mucosa was dusky and recommended hospital admission.   Assessment & Plan:   Principal Problem:   Volvulus of sigmoid colon s/p flex sig/rectal tube 09/27/2017 Active Problems:   Hypothyroidism   Primary osteoarthritis of right hip   Depression, major, single episode, severe ([facility])   Mild dementia   Chronic constipation   Protein-calorie malnutrition, moderate ([facility])   Sigmoid volvulus Status post reduction with sigmoidoscopy on 5/3.  Patient with continued hematochezia.  No other concerns. Abdominal x-ray is not reassuring. -GI/surgery recommendations: possible surgery -Continue antibiotic prophylaxis with ciprofloxacin and Flagyl  History of atrial fibrillation Very remote history from 2013.  Currently sinus rhythm.  Dementia Outpatient management.  Mild.  Major depression Per report, Lexapro discontinued as an outpatient.  Will need to discontinue on discharge -Continue Cymbalta.    Hypothyroidism -Synthroid 25 mcg daily starting 5/6 since patient will be NPO for possible surgery. Restart oral therapy once able to eat.  Osteoarthritis of the right hip Stable.  Acute blood loss anemia Dropped 3.1 g/dL to 12.9 g/dL.  Patient with associated hematochezia. Mild drop from yesterday.  Pulmonary infiltrates Clinically no pneumonia. ?edema. Patient has been on IV fluids. No heart failure history. -Chest x-ray -Discontinue IV fluids for now   DVT prophylaxis: SCDs in setting of GI  bleeding Code Status:   Code Status: Full Code Family Communication: None at bedside Disposition Plan: Discharge pending GI/surgery management and recommendations   Consultants:   Gastroenterology  General surgery  Procedures:   Sigmoidoscopy (5/3)  Antimicrobials:  Ciprofloxacin (5/3>>  Flagyl (5/3>>   Subjective: Multiple bowel movements. Passing gas. Walked yesterday  Objective: Vitals:   09/30/17 0457 09/30/17 0500 09/30/17 0600 09/30/17 1048  BP: 131/75   127/85  Pulse: 65   68  Resp: 19   16  Temp: 97.7 F (36.5 C)   97.8 F (36.6 C)  TempSrc: Oral   Oral  SpO2: 95%   97%  Weight:  75.3 kg (166 lb 0.1 oz) 72.9 kg (160 lb 11.5 oz)   Height:        Intake/Output Summary (Last 24 hours) at 09/30/2017 1108 Last data filed at 09/30/2017 1015 Gross per 24 hour  Intake 2453.67 ml  Output 2460 ml  Net -6.33 ml   Filed Weights   09/29/17 2235 09/30/17 0500 09/30/17 0600  Weight: 75.3 kg (166 lb 0.1 oz) 75.3 kg (166 lb 0.1 oz) 72.9 kg (160 lb 11.5 oz)    Examination:  General exam: Appears calm and comfortable Respiratory system: Diminished on auscultation. Respiratory effort normal. No wheezing or rales. Cardiovascular system: S1 & S2 heard, RRR. No murmurs. Gastrointestinal system: Abdomen is distended, soft and nontender. No organomegaly or masses felt. Decreased bowel sounds heard. Central nervous system: Alert and oriented. No focal neurological deficits. Extremities: No edema. No calf tenderness Skin: No cyanosis. No rashes Psychiatry: Flat affect, depressed. Withdrawn. Psychomotor retardation.    Data Reviewed: I have personally reviewed following labs and imaging studies  CBC: Recent Labs  Lab 09/27/17 0923 09/27/17 1416 09/28/17  1829 09/29/17 0607 09/30/17 0457  WBC 11.2* 11.7* 9.6 7.9 4.8  HGB 16.3 15.1 12.9* 12.3* 11.5*  HCT 46.5 44.1 38.3* 36.8* 34.6*  MCV 86.8 86.5 87.8 87.8 87.6  PLT 233 196 169 158 937   Basic Metabolic  Panel: Recent Labs  Lab 09/27/17 0923 09/27/17 1416 09/28/17 0613  NA 134*  --  134*  K 4.4  --  3.9  CL 99*  --  102  CO2 22  --  23  GLUCOSE 178*  --  100*  BUN 26*  --  20  CREATININE 0.96 0.95 0.95  CALCIUM 9.1  --  7.8*   GFR: Estimated Creatinine Clearance: 67.1 mL/min (by C-G formula based on SCr of 0.95 mg/dL). Liver Function Tests: Recent Labs  Lab 09/27/17 0923 09/28/17 0613  AST 29 22  ALT 23 16*  ALKPHOS 70 51  BILITOT 1.2 0.8  PROT 6.9 5.0*  ALBUMIN 4.1 2.9*   Recent Labs  Lab 09/27/17 0923  LIPASE 20   No results for input(s): AMMONIA in the last 168 hours. Coagulation Profile: Recent Labs  Lab 09/28/17 0613  INR 1.18   Cardiac Enzymes: No results for input(s): CKTOTAL, CKMB, CKMBINDEX, TROPONINI in the last 168 hours. BNP (last 3 results) No results for input(s): PROBNP in the last 8760 hours. HbA1C: No results for input(s): HGBA1C in the last 72 hours. CBG: No results for input(s): GLUCAP in the last 168 hours. Lipid Profile: No results for input(s): CHOL, HDL, LDLCALC, TRIG, CHOLHDL, LDLDIRECT in the last 72 hours. Thyroid Function Tests: No results for input(s): TSH, T4TOTAL, FREET4, T3FREE, THYROIDAB in the last 72 hours. Anemia Panel: No results for input(s): VITAMINB12, FOLATE, FERRITIN, TIBC, IRON, RETICCTPCT in the last 72 hours. Sepsis Labs: Recent Labs  Lab 09/27/17 1442  LATICACIDVEN 1.88    No results found for this or any previous visit (from the past 240 hour(s)).       Radiology Studies: Dg Abd Acute W/chest  Result Date: 09/30/2017 CLINICAL DATA:  Abdominal distention and pain. Status post flexible sigmoidoscopy on Sep 27, 2017 EXAM: DG ABDOMEN ACUTE W/ 1V CHEST COMPARISON:  Abdominal radiograph grafts of May 3rd and Sep 28, 2017 as well as chest x-ray of Sep 28, 2017 FINDINGS: The lungs are well-expanded. The interstitial markings are more prominent today. The cardiac silhouette is mildly enlarged. The pulmonary  vascularity is slightly more conspicuous. There is no definite pleural effusion. Within the abdomen there remain multiple loops of gas-filled small and large bowel. No free extraluminal gas collections are observed. There prosthetic hip joints bilaterally. IMPRESSION: Diffuse ileus versus distal colonic obstruction. There remains a loop of moderately distended sigmoid colon which could reflect residual sigmoid volvulus. No evidence of perforation. Increased interstitial markings bilaterally worrisome for mild interstitial edema or asymmetric interstitial pneumonia. Electronically Signed   By: David  Martinique M.D.   On: 09/30/2017 10:20        Scheduled Meds: . feeding supplement  1 Container Oral TID BM  . levothyroxine  25 mcg Intravenous Daily  . polyethylene glycol  51 g Oral TID  . saccharomyces boulardii  250 mg Oral BID  . sodium chloride flush  3 mL Intravenous Q12H   Continuous Infusions: . sodium chloride 1,000 mL (09/30/17 1015)  . cefTRIAXone (ROCEPHIN)  IV Stopped (09/29/17 1141)  . metronidazole 500 mg (09/30/17 1016)     LOS: 3 days     Cordelia Poche, MD [facility] 09/30/2017, 11:08 AM Pager: [phone]  If 7PM-7AM, please contact night-coverage [email or web address] 09/30/2017, 11:08 AM

## 2017-09-30 NOTE — Evaluation (Signed)
Occupational Therapy Evaluation Patient Details Name: TIELER COURNOYER MRN: [id number] DOB: March 25, 1941 Today's Date: 09/30/2017    History of Present Illness  Pt is a 77 y.o. male with medical history significant of chronic constipation, hypothyroidism, recent major depression, mild dementia, episode of atrial fibrillation, and OA of the right hip s/p THA 06/2016. He presented to the emergency department with his wife due to abdominal pain and distention. He was admitted for volvulus of sigmoid colon which was a result of chronic constipation.    Clinical Impression   Pt is typically independent in self care. Wife had just bathed and dressed pt upon arrival (retired Therapist, sports). Wife also answering most questions for pt, so difficult to assess cognition. Pt presents with generalized weakness. Will follow acutely. Per wife, pt may require surgery.     Follow Up Recommendations  No OT follow up    Equipment Recommendations  None recommended by OT(will continue to evaluate)    Recommendations for Other Services       Precautions / Restrictions Precautions Precautions: None Restrictions Weight Bearing Restrictions: No      Mobility Bed Mobility               General bed mobility comments: pt OOB upon arrival  Transfers Overall transfer level: Needs assistance Equipment used: None Transfers: Sit to/from Stand Sit to Stand: Supervision         General transfer comment: supervision for safety and IV line    Balance                                           ADL either performed or assessed with clinical judgement   ADL Overall ADL's : Needs assistance/impaired Eating/Feeding: Independent;Sitting               Upper Body Dressing : Set up;Sitting   Lower Body Dressing: Minimal assistance;Sit to/from stand   Toilet Transfer: Min guard;Ambulation;BSC           Functional mobility during ADLs: Min guard(pushed IV pole) General ADL Comments: wife  assisting pt with ADL despite his likely ability, wife is a retired Glass blower/designer Baseline Vision/History: Wears glasses Wears Glasses: At all times Patient Visual Report: No change from baseline       Perception     Praxis      Pertinent Vitals/Pain Pain Assessment: Faces Faces Pain Scale: Hurts little more Pain Location: abdomen Pain Descriptors / Indicators: Pressure Pain Intervention(s): Monitored during session;Repositioned     Hand Dominance Right   Extremity/Trunk Assessment Upper Extremity Assessment Upper Extremity Assessment: Overall WFL for tasks assessed   Lower Extremity Assessment Lower Extremity Assessment: Defer to PT evaluation       Communication Communication Communication: HOH   Cognition Arousal/Alertness: Awake/alert Behavior During Therapy: Flat affect Overall Cognitive Status: Within Functional Limits for tasks assessed                                 General Comments: pt with depression, wife answers questions for him   General Comments       Exercises     Shoulder Instructions      Home Living Family/patient expects to be discharged to:: Private residence Living Arrangements: Spouse/significant other Available Help at Discharge: Family;Available 24 hours/day Type of Home: House  Home Access: Stairs to enter Entrance Stairs-Number of Steps: 2 Entrance Stairs-Rails: None Home Layout: Laundry or work area in basement;One level(pt spends time in basement den)     [employer] Shower/Tub: Walk-in Psychologist, prison and probation services: Standard     Home Equipment: Environmental consultant - 2 wheels;Cane - quad;Bedside commode;Shower seat          Prior Functioning/Environment Level of Independence: Independent                 OT Problem List: Decreased strength      OT Treatment/Interventions: Self-care/ADL training;DME and/or AE instruction;Patient/family education;Therapeutic activities    OT Goals(Current goals can be found in the  care plan section) Acute Rehab OT Goals Patient Stated Goal: home OT Goal Formulation: With patient Time For Goal Achievement: 10/14/17 Potential to Achieve Goals: Good ADL Goals Pt Will Perform Grooming: with supervision;standing Pt Will Perform Upper Body Dressing: with set-up;sitting Pt Will Perform Lower Body Dressing: with supervision;sit to/from stand Pt Will Transfer to Toilet: with supervision;ambulating Pt Will Perform Toileting - Clothing Manipulation and hygiene: with supervision;sit to/from stand Pt Will Perform Tub/Shower Transfer: Shower transfer;with supervision;ambulating  OT Frequency: Min 2X/week   Barriers to D/C:            Co-evaluation              AM-PAC PT "6 Clicks" Daily Activity     Outcome Measure Help from another person eating meals?: None Help from another person taking care of personal grooming?: A Little Help from another person toileting, which includes using toliet, bedpan, or urinal?: A Little Help from another person bathing (including washing, rinsing, drying)?: A Little Help from another person to put on and taking off regular upper body clothing?: A Little Help from another person to put on and taking off regular lower body clothing?: A Little 6 Click Score: 19   End of Session    Activity Tolerance: Patient tolerated treatment well Patient left: in chair;with call bell/phone within reach;with family/visitor present;with nursing/sitter in room  OT Visit Diagnosis: Pain;Muscle weakness (generalized) (M62.81)                Time: 0350-0938 OT Time Calculation (min): 17 min Charges:  OT General Charges $OT Visit: 1 Visit OT Evaluation $OT Eval Low Complexity: 1 Low G-Codes:     2017-10-20 Nestor Lewandowsky, OTR/L Pager: [place], [place] 10-20-2017, 10:46 AM

## 2017-09-30 NOTE — Progress Notes (Signed)
As of this time, patient and his wife are still discussing whether they would like to proceed with surgery tomorrow. Wife and patient informed to let RN know as soon as they make a decision so that MD could be updated and orders be placed. Will continue to monitor.

## 2017-09-30 NOTE — Telephone Encounter (Signed)
Patient wife calling stating pt is having gi symptoms and wants to speak with Nevin Bloodgood regarding these symptoms. Patient is currently at the [facility] and pt wife was told that whoever is on call will take care of these gi issues. Patient wife still wants Paula's opinion.

## 2017-10-01 DIAGNOSIS — R140 Abdominal distension (gaseous): Secondary | ICD-10-CM

## 2017-10-01 LAB — CBC
HCT: 34.8 % — ABNORMAL LOW (ref 39.0–52.0)
Hemoglobin: 11.6 g/dL — ABNORMAL LOW (ref 13.0–17.0)
MCH: 29.1 pg (ref 26.0–34.0)
MCHC: 33.3 g/dL (ref 30.0–36.0)
MCV: 87.4 fL (ref 78.0–100.0)
Platelets: 161 10*3/uL (ref 150–400)
RBC: 3.98 MIL/uL — ABNORMAL LOW (ref 4.22–5.81)
RDW: 13.8 % (ref 11.5–15.5)
WBC: 3.7 10*3/uL — ABNORMAL LOW (ref 4.0–10.5)

## 2017-10-01 LAB — COMPREHENSIVE METABOLIC PANEL
ALT: 14 U/L — ABNORMAL LOW (ref 17–63)
AST: 20 U/L (ref 15–41)
Albumin: 2.7 g/dL — ABNORMAL LOW (ref 3.5–5.0)
Alkaline Phosphatase: 44 U/L (ref 38–126)
Anion gap: 7 (ref 5–15)
BUN: 11 mg/dL (ref 6–20)
CO2: 24 mmol/L (ref 22–32)
Calcium: 7.7 mg/dL — ABNORMAL LOW (ref 8.9–10.3)
Chloride: 108 mmol/L (ref 101–111)
Creatinine, Ser: 0.79 mg/dL (ref 0.61–1.24)
GFR calc Af Amer: >60 mL/min (ref >60)
GFR calc non Af Amer: >60 mL/min (ref >60)
Glucose, Bld: 92 mg/dL (ref 65–99)
Potassium: 3.2 mmol/L — ABNORMAL LOW (ref 3.5–5.1)
Sodium: 139 mmol/L (ref 135–145)
Total Bilirubin: 0.8 mg/dL (ref 0.3–1.2)
Total Protein: 4.6 g/dL — ABNORMAL LOW (ref 6.5–8.1)

## 2017-10-01 LAB — TYPE AND SCREEN
ABO/RH(D): B POS
Antibody Screen: NEGATIVE

## 2017-10-01 MED ORDER — POLYETHYLENE GLYCOL 3350 17 G PO PACK: 51.0000 g | PACK | Freq: Three times a day (TID) | ORAL | 0 refills | Status: DC

## 2017-10-01 NOTE — Telephone Encounter (Signed)
I saw him and wife in the ED at [facility]

## 2017-10-01 NOTE — Progress Notes (Signed)
Patient Discharge: Disposition: Patient discharged to home. Education: Reviewed medications, follow-up appointments, prescriptions, and discharge instructions, verbalized understanding.   IV: Discontinued IV before discharge. Transportation: patient escorted out of the unit in w/c. Belongings: Patient took all his belongings with him.

## 2017-10-01 NOTE — Discharge Instructions (Signed)
Jesse [name],  You are admitted because he had a sigmoid volvulus.  This was reduced by the gastroenterologist and your wash over the weekend.  Over the weekend your x-ray showed that your volvulus was likely returning.  General surgery had recommended for colectomy and colostomy but you have decided to seek a second opinion.  This is not ideal but it is your decision.  General surgery spoke with you and your wife prior to discharge and given strict precautions for seeking immediate medical care.  Please seek immediate medical care if you have worsening pain, fever, not able to tolerate fluids, not having bowel movements or passing gas.  Please stick to a clear liquid diet.

## 2017-10-01 NOTE — Discharge Summary (Signed)
Physician Discharge Summary  Jesse [name] JGG:[id number] DOB: 1940/08/10 DOA: 09/27/2017  PCP: Marletta Lor, MD  Admit date: 09/27/2017 Discharge date: 10/01/2017  Admitted From: Home Disposition: Home  Recommendations for Outpatient Follow-up:  Patient will likely need sigmoidectomy   Discharge Condition: Guarded CODE STATUS: Full code Diet recommendation: Clear liquid   Brief/Interim Summary:  Admission HPI written by Lady Deutscher, MD   Chief Complaint: Abdominal pain since yesterday  HPI: Jesse [name] is a 77 y.o. male with medical history significant of chronic constipation, hypothyroidism, recent major depression, mild dementia, episode of atrial fibrillation, and primary osteoarthritis of the right hip who presented to the emergency department today brought in by his wife due to abdominal pain and distention.  He states that he started having abdominal pain last night that has worsened.  He has become very distended and bloated.  She was concerned and brought him in for further evaluation and management.  The emergency department physical examination revealed acceptable vital signs and no fecal impaction and acute abdominal series showed large distention of the colon consistent with a sigmoid volvulus.  Surgery was consulted who recommended GI see the patient.  Dr. Arelia Longest from GI has seen the patient and he decompressed him but he is concerned about the appearance of the colon which appeared dusky.  He has further discussed with surgery and the plan is to admit the patient to monitor his course given the poor appearance of his colon. ED Course: Supple sigmoidoscopy with reduction of volvulus, continued pain requiring IV pain medications, decision to admit.  Surgical consultation which recommended continued observation after GI saw the patient and decompressed him.  I examined the patient prior to his decompression of his abdomen.  Denies headache, blurry vision,  chest pain, shortness of breath, cough, sputum production, dysuria, urinary frequency, nausea, vomiting, skin rash, skin masses, unilateral weakness or numbness    Hospital course:  Sigmoid volvulus Status post reduction with sigmoidoscopy on 5/3.  Patient with continued hematochezia.  Abdominal x-ray on 5/6 is not reassuring and general surgery recommending sigmoidectomy with colostomy.  This was discussed with the patient and his wife, and patient is against this plan.  Patient and patient's wife plan to travel to [place] for a second opinion.  General surgery discussed risks of not having this surgery performed sooner, including repeat and worsened volvulus, back to life.  Patient and patient's wife except risk.  Strict precautions were given to the patient and his wife for seeking immediate medical attention at a large medical facility.  Patient having bowel movements, passing gas, tolerating clear liquid diet.  History of atrial fibrillation Very remote history from 2013.  Currently sinus rhythm.  Dementia Outpatient management.  Mild.  Major depression Per report, Lexapro discontinued as an outpatient.  Will need to discontinue on discharge. Continued Cymbalta.    Hypothyroidism Patient treated with Synthroid 25 mcg IV while n.p.o.  Resume home Synthroid 50 mcg daily on discharge.  Osteoarthritis of the right hip Stable.  Acute blood loss anemia Secondary to hematochezia. Stable.  Pulmonary infiltrates Clinically no pneumonia. ?edema. Patient has been on IV fluids. No heart failure history. Will need follow-up.   Discharge Diagnoses:  Principal Problem:   Volvulus of sigmoid colon s/p flex sig/rectal tube 09/27/2017 Active Problems:   Hypothyroidism   Primary osteoarthritis of right hip   Depression, major, single episode, severe (HCC)   Mild dementia   Chronic constipation   Protein-calorie malnutrition, moderate (  [place])   Abdominal distension    Discharge  Instructions  Discharge Instructions    Call MD for:  severe uncontrolled pain   Complete by:  As directed    Call MD for:  temperature >100.4   Complete by:  As directed      Allergies as of 10/01/2017   No Known Allergies     Medication List    TAKE these medications   docusate sodium 100 MG capsule Commonly known as:  COLACE Take 1 capsule (100 mg total) by mouth every 12 (twelve) hours. What changed:  when to take this   DULoxetine 30 MG capsule Commonly known as:  CYMBALTA Take 60 mg by mouth daily.   escitalopram 5 MG tablet Commonly known as:  LEXAPRO Take 5 mg by mouth daily.   GLUCOSAMINE-MSM PO Take 1 tablet by mouth daily.   levothyroxine 50 MCG tablet Commonly known as:  SYNTHROID, LEVOTHROID Take 1 tablet (50 mcg total) daily before breakfast by mouth.   MULTIVITAMIN ADULT PO Take 1 tablet by mouth daily. One a Day   polyethylene glycol packet Commonly known as:  MIRALAX / GLYCOLAX Take 51 g by mouth 3 (three) times daily. What changed:    how much to take  when to take this  reasons to take this      Follow-up Information    Marletta Lor, MD. Schedule an appointment as soon as possible for a visit in 1 week(s).   Specialty:  Internal Medicine Contact information: [place] [address] [phone]          No Known Allergies  Consultations:  Gastroenterology  General surgery   Procedures/Studies: Dg Chest 1 View  Result Date: 09/30/2017 CLINICAL DATA:  Pneumonia questioned on recent abdominal films EXAM: CHEST  1 VIEW COMPARISON:  Chest and acute abdomen 09/30/2016 FINDINGS: Only a lateral view of the chest was obtained. There do appear to be small pleural effusions with somewhat prominent interstitial markings in this patient with COPD possibly indicating mild interstitial edema. No definite pneumonia is seen. No bony abnormality is noted. IMPRESSION: Suspect mild interstitial edema with small  pleural effusions and slightly prominent interstitial markings. Electronically Signed   By: Ivar Drape M.D.   On: 09/30/2017 16:28   Dg Abd 1 View  Result Date: 09/27/2017 CLINICAL DATA:  Followup sigmoid volvulus falling rectal tube insertion. EXAM: ABDOMEN - 1 VIEW COMPARISON:  Earlier today. FINDINGS: Interval rectal tube with a significant decrease in caliber of the sigmoid colon. This previously measured 13.8 cm in diameter and currently measures 5.4 cm in diameter. There is persistent gaseous distention of the remainder the colon, filled with stool. Bilateral hip prostheses. IMPRESSION: 1. Resolved pattern of sigmoid volvulus following rectal tube insertion. 2. Continued gaseous distention and stool throughout the remainder of the colon. Electronically Signed   By: Claudie Revering M.D.   On: 09/27/2017 15:05   Mr Brain Wo Contrast  Result Date: 09/04/2017 CLINICAL DATA:  Altered level of consciousness. Cognitive impairment. Patient has worsening memory loss and confusion for 1 year. EXAM: MRI HEAD WITHOUT CONTRAST TECHNIQUE: Multiplanar, multiecho pulse sequences of the brain and surrounding structures were obtained without intravenous contrast. COMPARISON:  None. FINDINGS: Brain: No specific explanation for memory loss. There is generalized brain volume loss that is symmetric and mild for age. Rare FLAIR hyperintensities in the cerebral white matter; no unexpected ischemic injury. No hydrocephalus, extra-axial collection, or abnormal diffusion. There is a dural based mass  measuring 13 x 5 mm along the high anterior left frontal convexity, likely meningioma. Vascular: Major flow voids are preserved. Skull and upper cervical spine: No evidence for marrow lesion. C3-4 degenerative disc narrowing. Sinuses/Orbits: Negative IMPRESSION: 1. No specific or reversible explanation for memory loss. 2. 13 x 5 mm dural mass along the high left frontal convexity favoring meningioma. Follow-up could confirm expected  stability. Electronically Signed   By: Monte Fantasia M.D.   On: 09/04/2017 07:47   Dg Abd Acute W/chest  Result Date: 09/30/2017 CLINICAL DATA:  Abdominal distention and pain. Status post flexible sigmoidoscopy on Sep 27, 2017 EXAM: DG ABDOMEN ACUTE W/ 1V CHEST COMPARISON:  Abdominal radiograph grafts of May 3rd and Sep 28, 2017 as well as chest x-ray of Sep 28, 2017 FINDINGS: The lungs are well-expanded. The interstitial markings are more prominent today. The cardiac silhouette is mildly enlarged. The pulmonary vascularity is slightly more conspicuous. There is no definite pleural effusion. Within the abdomen there remain multiple loops of gas-filled small and large bowel. No free extraluminal gas collections are observed. There prosthetic hip joints bilaterally. IMPRESSION: Diffuse ileus versus distal colonic obstruction. There remains a loop of moderately distended sigmoid colon which could reflect residual sigmoid volvulus. No evidence of perforation. Increased interstitial markings bilaterally worrisome for mild interstitial edema or asymmetric interstitial pneumonia. Electronically Signed   By: David  Martinique M.D.   On: 09/30/2017 10:20   Acute Abdominal Series  Result Date: 09/28/2017 CLINICAL DATA:  Sigmoid volvulus. EXAM: DG ABDOMEN ACUTE W/ 1V CHEST COMPARISON:  Abdominal x-ray from yesterday. FINDINGS: Unchanged rectal tube. Mild gaseous distention of the sigmoid colon, slightly increased when compared to prior study. No definite recurrent volvulus. Large amount of stool throughout the remaining colon. Heart size and mediastinal contours are within normal limits. Both lungs are clear. IMPRESSION: 1. Slightly increased gaseous distention of the sigmoid colon without definite recurrent volvulus. 2.  No active cardiopulmonary disease. Electronically Signed   By: Titus Dubin M.D.   On: 09/28/2017 11:56   Dg Abdomen Acute W/chest  Result Date: 09/27/2017 CLINICAL DATA:  Severe abdominal pain for 2  days. EXAM: DG ABDOMEN ACUTE W/ 1V CHEST COMPARISON:  None. FINDINGS: Heart is upper limits normal in size. Minimal left base atelectasis. Right lung clear. No effusions. Marked gaseous distention of the colon, with large bean shaped loop of bowel in the abdomen measuring up to 13.7 cm in diameter. This is concerning for sigmoid volvulus. Large stool burden throughout the colon. No free air. No organomegaly. IMPRESSION: Massive gaseous distention of the colon, with bowel-gas pattern concerning for sigmoid volvulus. Large stool burden throughout the colon. Electronically Signed   By: Rolm Baptise M.D.   On: 09/27/2017 10:35      Subjective: No abdominal pain. Passing gas and having bowel movements.  Discharge Exam: Vitals:   09/30/17 2056 10/01/17 0505  BP: 125/82 129/81  Pulse: (!) 58 71  Resp:    Temp: (!) 97.4 F (36.3 C) 97.9 F (36.6 C)  SpO2: 98% 94%   Vitals:   09/30/17 1048 09/30/17 1550 09/30/17 2056 10/01/17 0505  BP: 127/85 125/75 125/82 129/81  Pulse: 68 66 (!) 58 71  Resp: 16 18    Temp: 97.8 F (36.6 C) 98.1 F (36.7 C) (!) 97.4 F (36.3 C) 97.9 F (36.6 C)  TempSrc: Oral Oral Oral Oral  SpO2: 97% 98% 98% 94%  Weight:   72.8 kg (160 lb 7.9 oz)   Height:  General: Pt is alert, awake, not in acute distress Cardiovascular: RRR, S1/S2 +, no rubs, no gallops Respiratory: CTA bilaterally, no wheezing, no rhonchi Abdominal: Soft, NT, ND, decreased bowel sounds Extremities: no edema, no cyanosis Psych: flat affect    The results of significant diagnostics from this hospitalization (including imaging, microbiology, ancillary and laboratory) are listed below for reference.     Microbiology: No results found for this or any previous visit (from the past 240 hour(s)).   Labs: BNP (last 3 results) No results for input(s): BNP in the last 8760 hours. Basic Metabolic Panel: Recent Labs  Lab 09/27/17 0923 09/27/17 1416 09/28/17 0613 10/01/17 0740  NA  134*  --  134* 139  K 4.4  --  3.9 3.2*  CL 99*  --  102 108  CO2 22  --  23 24  GLUCOSE 178*  --  100* 92  BUN 26*  --  20 11  CREATININE 0.96 0.95 0.95 0.79  CALCIUM 9.1  --  7.8* 7.7*   Liver Function Tests: Recent Labs  Lab 09/27/17 0923 09/28/17 0613 10/01/17 0740  AST 29 22 20   ALT 23 16* 14*  ALKPHOS 70 51 44  BILITOT 1.2 0.8 0.8  PROT 6.9 5.0* 4.6*  ALBUMIN 4.1 2.9* 2.7*   Recent Labs  Lab 09/27/17 0923  LIPASE 20   No results for input(s): AMMONIA in the last 168 hours. CBC: Recent Labs  Lab 09/27/17 1416 09/28/17 0613 09/29/17 0607 09/30/17 0457 10/01/17 0740  WBC 11.7* 9.6 7.9 4.8 3.7*  HGB 15.1 12.9* 12.3* 11.5* 11.6*  HCT 44.1 38.3* 36.8* 34.6* 34.8*  MCV 86.5 87.8 87.8 87.6 87.4  PLT 196 169 158 158 161   Cardiac Enzymes: No results for input(s): CKTOTAL, CKMB, CKMBINDEX, TROPONINI in the last 168 hours. BNP: Invalid input(s): POCBNP CBG: No results for input(s): GLUCAP in the last 168 hours. D-Dimer No results for input(s): DDIMER in the last 72 hours. Hgb A1c No results for input(s): HGBA1C in the last 72 hours. Lipid Profile No results for input(s): CHOL, HDL, LDLCALC, TRIG, CHOLHDL, LDLDIRECT in the last 72 hours. Thyroid function studies No results for input(s): TSH, T4TOTAL, T3FREE, THYROIDAB in the last 72 hours.  Invalid input(s): FREET3 Anemia work up No results for input(s): VITAMINB12, FOLATE, FERRITIN, TIBC, IRON, RETICCTPCT in the last 72 hours. Urinalysis    Component Value Date/Time   COLORURINE YELLOW 09/27/2017 2044   APPEARANCEUR HAZY (A) 09/27/2017 2044   LABSPEC 1.028 09/27/2017 2044   PHURINE 5.0 09/27/2017 2044   GLUCOSEU 150 (A) 09/27/2017 2044   HGBUR NEGATIVE 09/27/2017 2044   BILIRUBINUR NEGATIVE 09/27/2017 2044   KETONESUR 20 (A) 09/27/2017 2044   PROTEINUR 30 (A) 09/27/2017 2044   NITRITE NEGATIVE 09/27/2017 2044   LEUKOCYTESUR NEGATIVE 09/27/2017 2044     SIGNED:   Cordelia Poche, MD [facility] 10/01/2017, 9:53 AM

## 2017-10-01 NOTE — Progress Notes (Signed)
Patient ID: Jesse [name], male   DOB: 18-Apr-1941, 77 y.o.   MRN: [id number]   Acute Care Surgery Service Progress Note:    Chief Complaint/Subjective: Informed by nursing director and hospitalist that after patient and wife had discussed surgical plan last night that they have made decision that they would like to seek a second opinion in [place] and I was asked to come and speak with the patient  Patient's daytime nurse was present during the conversation  Patient's wife states that patient is not  mentally ready for colostomy and that he is feeling better, tolerating a diet, and having a bowel movements and request discharge so that they can go to [place] for second opinion.  I had had an extensive discussion yesterday with the patient his wife regarding why I felt primary anastomosis would be unacceptable risk and therefore recommended colectomy with diverting colostomy.  I did explain during yesterday's extensive conversation that the colostomy could be reversed several months from now-that it was not necessarily permanent  Patient & wife states that he feels less bloated this morning, has no nausea or vomiting overnight.  Has been tolerating liquids.  Reports flatus and semi-formed bowel movement overnight without any blood or being dark  Objective: Vital signs in last 24 hours: Temp:  [97.4 F (36.3 C)-98.3 F (36.8 C)] 98.3 F (36.8 C) (05/07 0900) Pulse Rate:  [58-71] 68 (05/07 0900) Resp:  [22] 22 (05/07 0900) BP: (125-129)/(81-82) 128/82 (05/07 0900) SpO2:  [94 %-98 %] 96 % (05/07 0900) Weight:  [72.8 kg (160 lb 7.9 oz)] 72.8 kg (160 lb 7.9 oz) (05/06 2056) Last BM Date: 09/30/17  Intake/Output from previous day: 05/06 0701 - 05/07 0700 In: 3560.4 [P.O.:25; I.V.:2935.4; IV Piggyback:600] Out: 1850 [Urine:1850] Intake/Output this shift: Total I/O In: 360 [P.O.:360] Out: 350 [Urine:350]  Lungs: cta, nonlabored  Cardiovascular: reg  Abd: Soft, bloated,  nontender  Extremities: no edema, +SCDs  Neuro: alert, nonfocal  Lab Results: CBC  Recent Labs    09/30/17 0457 10/01/17 0740  WBC 4.8 3.7*  HGB 11.5* 11.6*  HCT 34.6* 34.8*  PLT 158 161   BMET Recent Labs    10/01/17 0740  NA 139  K 3.2*  CL 108  CO2 24  GLUCOSE 92  BUN 11  CREATININE 0.79  CALCIUM 7.7*   LFT Hepatic Function Latest Ref Rng & Units 10/01/2017 09/28/2017 09/27/2017  Total Protein 6.5 - 8.1 g/dL 4.6(L) 5.0(L) 6.9  Albumin 3.5 - 5.0 g/dL 2.7(L) 2.9(L) 4.1  AST 15 - 41 U/L '20 22 29  '$ ALT 17 - 63 U/L 14(L) 16(L) 23  Alk Phosphatase 38 - 126 U/L 44 51 70  Total Bilirubin 0.3 - 1.2 mg/dL 0.8 0.8 1.2   PT/INR No results for input(s): LABPROT, INR in the last 72 hours. ABG No results for input(s): PHART, HCO3 in the last 72 hours.  Invalid input(s): PCO2, PO2  Studies/Results:  Anti-infectives: Anti-infectives (From admission, onward)   Start     Dose/Rate Route Frequency Ordered Stop   10/01/17 0600  cefoTEtan (CEFOTAN) 2 g in sodium chloride 0.9 % 100 mL IVPB  Status:  Discontinued     2 g 200 mL/hr over 30 Minutes Intravenous On call to O.R. 09/30/17 1440 09/30/17 1441   09/28/17 1100  cefTRIAXone (ROCEPHIN) 2 g in sodium chloride 0.9 % 100 mL IVPB  Status:  Discontinued    Note to Pharmacy:  Pharmacy may adjust dosing strength / duration / interval for  maximal efficacy   2 g 200 mL/hr over 30 Minutes Intravenous Every 24 hours 09/28/17 1013 10/01/17 1341   09/27/17 1500  ciprofloxacin (CIPRO) IVPB 400 mg  Status:  Discontinued     400 mg 200 mL/hr over 60 Minutes Intravenous Every 12 hours 09/27/17 1452 09/28/17 1013   09/27/17 1500  metroNIDAZOLE (FLAGYL) IVPB 500 mg  Status:  Discontinued     500 mg 100 mL/hr over 60 Minutes Intravenous Every 8 hours 09/27/17 1452 10/01/17 1341      Medications: Scheduled Meds: Continuous Infusions: PRN Meds:.  Assessment/Plan: Patient Active Problem List   Diagnosis Date Noted  . Abdominal distension    . Protein-calorie malnutrition, moderate ([place]) 09/29/2017  . Volvulus of sigmoid colon s/p flex sig/rectal tube 09/27/2017 09/27/2017  . Mild dementia 09/27/2017  . Chronic constipation 09/27/2017  . Cognitive impairment 08/27/2017  . Viral URI with cough 07/01/2017  . Depression, major, single episode, severe ([place]) 12/14/2016  . Primary osteoarthritis of right hip 07/04/2016  . Vitiligo 06/11/2016  . Osteoarthritis of right hip 06/05/2016  . Malignant neoplasm of prostate ([place]) 12/07/2015  . Hypothyroidism 12/07/2015   Sigmoid volvulus status post flexible sigmoid decompression May 3  Re-explained to patient and his wife again my concerns for the status of his sigmoid colon.  While he is feeling better, tolerating liquids, afebrile, no leukocytosis, having a bowel movements-he is still bloated and I am concerned that he still has ongoing volvulus versus impending re-volvulus based on his plain films yesterday.  We discussed the risk of recurrent volvulus  I advised them that I I am not here to force somebody to have surgery against their wishes however I am very concerned about him leaving the hospital and driving to [place] and not having potential clinical deterioration.  I advised them that if they choose to leave what they should immediately seek medical attention for and that the patient should remain on clear liquids and nothing heavier  I advised him that it would be high risk to travel that far.  They thanked me for my time  Leighton Ruff. Redmond Pulling, MD, FACS General, Bariatric, & Minimally Invasive Surgery [facility], [place]  Disposition:  LOS: 4 days    Leighton Ruff. Redmond Pulling, MD, FACS General, Bariatric, & Minimally Invasive Surgery [phone] [facility]

## 2017-10-01 NOTE — Consult Note (Signed)
            [facility] CM Primary Care Navigator  10/01/2017  STEFON RAMTHUN 01-Jun-1940 [id number]   Went to see patient at the bedside to identify possible discharge needsbuthe was already dischargedhome today.  Patient was seen and evaluated for worsening abdominal pain and distention. (sigmoid volvulus with recommendation for sigmoidectomy with colostomy)  Primary care provider's officeis listed as providingtransition of care (TOC)follow-up.   Patient has discharge instruction to follow-up withprimary care provider in 1 week.   For additional questions please contact:  Edwena Felty A. Janyth Riera, BSN, RN-BC [facility] PRIMARY CARE Navigator Cell: [phone]

## 2017-10-02 ENCOUNTER — Telehealth: Payer: Self-pay | Admitting: Family Medicine

## 2017-10-02 DIAGNOSIS — R109 Unspecified abdominal pain: Secondary | ICD-10-CM | POA: Diagnosis not present

## 2017-10-02 DIAGNOSIS — F329 Major depressive disorder, single episode, unspecified: Secondary | ICD-10-CM | POA: Diagnosis not present

## 2017-10-02 DIAGNOSIS — R401 Stupor: Secondary | ICD-10-CM | POA: Diagnosis not present

## 2017-10-02 DIAGNOSIS — F0280 Dementia in other diseases classified elsewhere without behavioral disturbance: Secondary | ICD-10-CM | POA: Diagnosis present

## 2017-10-02 DIAGNOSIS — K567 Ileus, unspecified: Secondary | ICD-10-CM | POA: Diagnosis present

## 2017-10-02 DIAGNOSIS — K6389 Other specified diseases of intestine: Secondary | ICD-10-CM | POA: Diagnosis not present

## 2017-10-02 DIAGNOSIS — R601 Generalized edema: Secondary | ICD-10-CM | POA: Diagnosis not present

## 2017-10-02 DIAGNOSIS — F418 Other specified anxiety disorders: Secondary | ICD-10-CM | POA: Diagnosis not present

## 2017-10-02 DIAGNOSIS — R1900 Intra-abdominal and pelvic swelling, mass and lump, unspecified site: Secondary | ICD-10-CM | POA: Diagnosis not present

## 2017-10-02 DIAGNOSIS — F419 Anxiety disorder, unspecified: Secondary | ICD-10-CM | POA: Diagnosis not present

## 2017-10-02 DIAGNOSIS — K56699 Other intestinal obstruction unspecified as to partial versus complete obstruction: Secondary | ICD-10-CM | POA: Diagnosis not present

## 2017-10-02 DIAGNOSIS — Z8546 Personal history of malignant neoplasm of prostate: Secondary | ICD-10-CM | POA: Diagnosis not present

## 2017-10-02 DIAGNOSIS — R140 Abdominal distension (gaseous): Secondary | ICD-10-CM | POA: Diagnosis not present

## 2017-10-02 DIAGNOSIS — K5909 Other constipation: Secondary | ICD-10-CM | POA: Diagnosis not present

## 2017-10-02 DIAGNOSIS — I480 Paroxysmal atrial fibrillation: Secondary | ICD-10-CM | POA: Diagnosis not present

## 2017-10-02 DIAGNOSIS — Z923 Personal history of irradiation: Secondary | ICD-10-CM | POA: Diagnosis not present

## 2017-10-02 DIAGNOSIS — F332 Major depressive disorder, recurrent severe without psychotic features: Secondary | ICD-10-CM | POA: Diagnosis not present

## 2017-10-02 DIAGNOSIS — J90 Pleural effusion, not elsewhere classified: Secondary | ICD-10-CM | POA: Diagnosis not present

## 2017-10-02 DIAGNOSIS — K562 Volvulus: Secondary | ICD-10-CM | POA: Diagnosis not present

## 2017-10-02 DIAGNOSIS — I517 Cardiomegaly: Secondary | ICD-10-CM | POA: Diagnosis not present

## 2017-10-02 DIAGNOSIS — Z6823 Body mass index (BMI) 23.0-23.9, adult: Secondary | ICD-10-CM | POA: Diagnosis not present

## 2017-10-02 DIAGNOSIS — Q438 Other specified congenital malformations of intestine: Secondary | ICD-10-CM | POA: Diagnosis not present

## 2017-10-02 DIAGNOSIS — K5939 Other megacolon: Secondary | ICD-10-CM | POA: Diagnosis not present

## 2017-10-02 DIAGNOSIS — R932 Abnormal findings on diagnostic imaging of liver and biliary tract: Secondary | ICD-10-CM | POA: Diagnosis not present

## 2017-10-02 DIAGNOSIS — G20 Parkinson's disease: Secondary | ICD-10-CM | POA: Diagnosis present

## 2017-10-02 DIAGNOSIS — K56609 Unspecified intestinal obstruction, unspecified as to partial versus complete obstruction: Secondary | ICD-10-CM | POA: Diagnosis not present

## 2017-10-02 DIAGNOSIS — F3289 Other specified depressive episodes: Secondary | ICD-10-CM | POA: Diagnosis not present

## 2017-10-02 DIAGNOSIS — M1990 Unspecified osteoarthritis, unspecified site: Secondary | ICD-10-CM | POA: Diagnosis present

## 2017-10-02 DIAGNOSIS — Z049 Encounter for examination and observation for unspecified reason: Secondary | ICD-10-CM | POA: Diagnosis not present

## 2017-10-02 DIAGNOSIS — F341 Dysthymic disorder: Secondary | ICD-10-CM | POA: Diagnosis present

## 2017-10-02 DIAGNOSIS — C61 Malignant neoplasm of prostate: Secondary | ICD-10-CM | POA: Diagnosis not present

## 2017-10-02 DIAGNOSIS — G3184 Mild cognitive impairment, so stated: Secondary | ICD-10-CM | POA: Diagnosis not present

## 2017-10-02 DIAGNOSIS — E039 Hypothyroidism, unspecified: Secondary | ICD-10-CM | POA: Diagnosis not present

## 2017-10-02 DIAGNOSIS — Z96643 Presence of artificial hip joint, bilateral: Secondary | ICD-10-CM | POA: Diagnosis not present

## 2017-10-02 NOTE — Telephone Encounter (Signed)
I left a voice message for pt to return my call.  

## 2017-10-03 DIAGNOSIS — F329 Major depressive disorder, single episode, unspecified: Secondary | ICD-10-CM | POA: Insufficient documentation

## 2017-10-03 DIAGNOSIS — F419 Anxiety disorder, unspecified: Secondary | ICD-10-CM | POA: Insufficient documentation

## 2017-10-03 NOTE — Telephone Encounter (Signed)
I left a voice message for pt to return my call.  

## 2017-10-04 NOTE — Telephone Encounter (Signed)
I called again, no answer, left another voice message to return my call.

## 2017-10-11 LAB — HM SIGMOIDOSCOPY

## 2017-10-14 ENCOUNTER — Encounter: Payer: Self-pay | Admitting: Psychology

## 2017-10-14 ENCOUNTER — Encounter

## 2017-10-15 ENCOUNTER — Ambulatory Visit: Payer: Medicare Other | Admitting: Neurology

## 2017-10-15 ENCOUNTER — Encounter

## 2017-10-24 ENCOUNTER — Encounter: Payer: Self-pay | Admitting: Psychology

## 2017-10-24 ENCOUNTER — Encounter

## 2017-10-25 NOTE — Telephone Encounter (Signed)
I spoke with wife and pt doing okay, will schedule a hospital follow up some time in June 2019 with Dr. Raliegh Ip.

## 2017-10-25 NOTE — Telephone Encounter (Signed)
Pt is very busy and will wait for sylvia return call

## 2017-10-28 DIAGNOSIS — F332 Major depressive disorder, recurrent severe without psychotic features: Secondary | ICD-10-CM | POA: Diagnosis not present

## 2017-11-01 ENCOUNTER — Encounter: Payer: Self-pay | Admitting: Podiatry

## 2017-11-01 ENCOUNTER — Ambulatory Visit (INDEPENDENT_AMBULATORY_CARE_PROVIDER_SITE_OTHER): Payer: Medicare Other | Admitting: Podiatry

## 2017-11-01 DIAGNOSIS — M79676 Pain in unspecified toe(s): Secondary | ICD-10-CM | POA: Diagnosis not present

## 2017-11-01 DIAGNOSIS — B351 Tinea unguium: Secondary | ICD-10-CM

## 2017-11-01 NOTE — Progress Notes (Signed)
This patient presents the office for an evaluation and treatment of his long painful nails  . Patient states that the nails are painful walking and wearing his shoes.  Patient is presented to the office with his wife.  . He presents the office today for preventative foot care services   General Appearance  Alert, conversant and in no acute stress.  Vascular  Dorsalis pedis and posterior pulses are palpable  bilaterally.  Capillary return is within normal limits  Bilaterally. Temperature is within normal limits  Bilaterally  Neurologic  Senn-Weinstein monofilament wire test within normal limits  bilaterally. Muscle power  Within normal limits bilaterally.  Nails Thick disfigured discolored nails with subungual debride bilaterally from hallux to fifth toes bilaterally. No evidence of bacterial infection or drainage bilaterally.  Orthopedic  No limitations of motion of motion feet bilaterally.  No crepitus or effusions noted.  No bony pathology or digital deformities noted.  Skin  normotropic skin with no porokeratosis noted bilaterally.  No signs of infections or ulcers noted.    Diagnosis  Onychomycosis  B/L   Debridement and grinding of long painful nails.  RTC 9 weeks.   Hutch Rhett DPM 

## 2017-11-04 DIAGNOSIS — K562 Volvulus: Secondary | ICD-10-CM | POA: Diagnosis not present

## 2017-11-05 DIAGNOSIS — G3184 Mild cognitive impairment, so stated: Secondary | ICD-10-CM | POA: Diagnosis not present

## 2017-11-19 ENCOUNTER — Encounter: Payer: Self-pay | Admitting: Psychology

## 2017-12-09 DIAGNOSIS — F332 Major depressive disorder, recurrent severe without psychotic features: Secondary | ICD-10-CM | POA: Diagnosis not present

## 2017-12-24 DIAGNOSIS — N5201 Erectile dysfunction due to arterial insufficiency: Secondary | ICD-10-CM | POA: Diagnosis not present

## 2017-12-24 DIAGNOSIS — C61 Malignant neoplasm of prostate: Secondary | ICD-10-CM | POA: Diagnosis not present

## 2017-12-27 DIAGNOSIS — C61 Malignant neoplasm of prostate: Secondary | ICD-10-CM | POA: Diagnosis not present

## 2017-12-27 DIAGNOSIS — N5201 Erectile dysfunction due to arterial insufficiency: Secondary | ICD-10-CM | POA: Diagnosis not present

## 2018-01-03 ENCOUNTER — Encounter: Payer: Self-pay | Admitting: Podiatry

## 2018-01-03 ENCOUNTER — Ambulatory Visit (INDEPENDENT_AMBULATORY_CARE_PROVIDER_SITE_OTHER): Payer: Medicare Other | Admitting: Podiatry

## 2018-01-03 DIAGNOSIS — M79676 Pain in unspecified toe(s): Secondary | ICD-10-CM

## 2018-01-03 DIAGNOSIS — B351 Tinea unguium: Secondary | ICD-10-CM | POA: Diagnosis not present

## 2018-01-03 DIAGNOSIS — L603 Nail dystrophy: Secondary | ICD-10-CM

## 2018-01-03 NOTE — Progress Notes (Signed)
This patient presents the office for an evaluation and treatment of his long painful nails  . Patient states that the nails are painful walking and wearing his shoes.  Patient is presented to the office with his wife.  Marland Kitchen He presents the office today for preventative foot care services   General Appearance  Alert, conversant and in no acute stress.  Vascular  Dorsalis pedis and posterior pulses are palpable  bilaterally.  Capillary return is within normal limits  Bilaterally. Temperature is within normal limits  Bilaterally  Neurologic  Senn-Weinstein monofilament wire test within normal limits  bilaterally. Muscle power  Within normal limits bilaterally.  Nails Thick disfigured discolored nails with subungual debride bilaterally from hallux to fifth toes bilaterally. No evidence of bacterial infection or drainage bilaterally.  Orthopedic  No limitations of motion of motion feet bilaterally.  No crepitus or effusions noted.  No bony pathology or digital deformities noted.  Skin  normotropic skin with no porokeratosis noted bilaterally.  No signs of infections or ulcers noted.    Diagnosis  Onychomycosis  B/L   Debridement and grinding of long painful nails.  RTC 9 weeks.   Gardiner Barefoot DPM

## 2018-01-21 DIAGNOSIS — F331 Major depressive disorder, recurrent, moderate: Secondary | ICD-10-CM | POA: Diagnosis not present

## 2018-02-24 NOTE — Progress Notes (Signed)
   Subjective:   Jesse [name] is a 77 y.o. male who presents for Medicare Annual/Subsequent preventive examination. Error the patient canceled. Ignore the visit

## 2018-02-25 ENCOUNTER — Ambulatory Visit: Payer: Medicare Other

## 2018-02-26 NOTE — Patient Instructions (Signed)
Error

## 2018-03-07 ENCOUNTER — Encounter: Payer: Self-pay | Admitting: Podiatry

## 2018-03-07 ENCOUNTER — Ambulatory Visit (INDEPENDENT_AMBULATORY_CARE_PROVIDER_SITE_OTHER): Payer: Medicare Other | Admitting: Podiatry

## 2018-03-07 DIAGNOSIS — B351 Tinea unguium: Secondary | ICD-10-CM

## 2018-03-07 DIAGNOSIS — M79675 Pain in left toe(s): Secondary | ICD-10-CM

## 2018-03-07 DIAGNOSIS — M79674 Pain in right toe(s): Secondary | ICD-10-CM

## 2018-03-07 NOTE — Progress Notes (Signed)
Subjective: Jesse [name] presents with his wife on today for followup of painful, discolored, thick toenails which interfere with daily activities and routine tasks.  Pain is aggravated when wearing enclosed shoe gear and relieved with periodic professional debridement.  Wife states they just came back from the [place] and he has gotten sunburn on both feet. They are doing better and she has been applying Cerave Cream daily.   Objective: Vascular Examination: Capillary refill time <3 seconds x 10 digits Dorsalis pedis and posterior tibial pulses present b/l No digital hair x 10 digits Skin temperature warm to warm b/l  Dermatological Examination: Skin with mild age related atrophy. Peeling noted dorsum of feet from given recent episode of sunburn. No erythema, no edema, no blisters, no pain b/l feet  Toenails 1-5 b/l discolored, thick, dystrophic with subungual debris and pain with palpation to nailbeds due to thickness of nails.  Musculoskeletal: Muscle strength 5/5 to all LE muscle groups  Neurological: Sensation intact with 10 gram monofilament. Vibratory sensation intact.  Assessment: Painful onychomycosis toenails 1-5 b/l   Plan: 1. Toenails 1-5 b/l were debrided in length and girth without iatrogenic bleeding. 2. Continue Cerave to dorsum of both feet until sunburn resolved and start daily moisturizer 3. Patient to continue soft, supportive shoe gear 4. Patient to report any pedal injuries to medical professional immediately. 5. Follow up 9 weeks.  6. Patient/POA to call should there be a concern in the interim.

## 2018-03-12 DIAGNOSIS — Z23 Encounter for immunization: Secondary | ICD-10-CM | POA: Diagnosis not present

## 2018-03-24 DIAGNOSIS — R5383 Other fatigue: Secondary | ICD-10-CM | POA: Diagnosis not present

## 2018-03-24 DIAGNOSIS — Z0000 Encounter for general adult medical examination without abnormal findings: Secondary | ICD-10-CM | POA: Diagnosis not present

## 2018-03-24 DIAGNOSIS — E039 Hypothyroidism, unspecified: Secondary | ICD-10-CM | POA: Diagnosis not present

## 2018-03-24 DIAGNOSIS — D320 Benign neoplasm of cerebral meninges: Secondary | ICD-10-CM | POA: Diagnosis not present

## 2018-03-24 DIAGNOSIS — F329 Major depressive disorder, single episode, unspecified: Secondary | ICD-10-CM | POA: Diagnosis not present

## 2018-03-24 DIAGNOSIS — Z8546 Personal history of malignant neoplasm of prostate: Secondary | ICD-10-CM | POA: Diagnosis not present

## 2018-03-24 DIAGNOSIS — F0390 Unspecified dementia without behavioral disturbance: Secondary | ICD-10-CM | POA: Diagnosis not present

## 2018-03-30 ENCOUNTER — Other Ambulatory Visit: Payer: Self-pay | Admitting: Family Medicine

## 2018-03-30 DIAGNOSIS — E039 Hypothyroidism, unspecified: Secondary | ICD-10-CM

## 2018-04-01 ENCOUNTER — Telehealth: Payer: Self-pay | Admitting: Psychology

## 2018-04-01 DIAGNOSIS — F411 Generalized anxiety disorder: Secondary | ICD-10-CM | POA: Diagnosis not present

## 2018-04-01 DIAGNOSIS — F332 Major depressive disorder, recurrent severe without psychotic features: Secondary | ICD-10-CM | POA: Diagnosis not present

## 2018-04-01 NOTE — Telephone Encounter (Signed)
Dr Bailar is leaving for a new job closer to home we have mailed a letter to the patient to inform them that we canceled all appointments with Dr Bailar. We thank you for the understanding °

## 2018-04-23 DIAGNOSIS — H903 Sensorineural hearing loss, bilateral: Secondary | ICD-10-CM | POA: Diagnosis not present

## 2018-04-29 ENCOUNTER — Other Ambulatory Visit: Payer: Self-pay | Admitting: Family Medicine

## 2018-04-29 DIAGNOSIS — E039 Hypothyroidism, unspecified: Secondary | ICD-10-CM

## 2018-05-01 ENCOUNTER — Telehealth: Payer: Self-pay | Admitting: Psychology

## 2018-05-01 NOTE — Telephone Encounter (Signed)
Spoke with patient's wife regarding letter (Dr Si Raider leaving). Wife stated that patient is seeing a psychotherapist and has an appt on May 09, 2018.  Wife is not interested in our practice referring to [facility]. Wife very grateful for the follow-up regarding the letter.

## 2018-05-09 DIAGNOSIS — F330 Major depressive disorder, recurrent, mild: Secondary | ICD-10-CM | POA: Diagnosis not present

## 2018-05-12 ENCOUNTER — Ambulatory Visit: Payer: Medicare Other | Admitting: Podiatry

## 2018-05-13 ENCOUNTER — Encounter: Payer: Self-pay | Admitting: Podiatry

## 2018-05-13 ENCOUNTER — Ambulatory Visit (INDEPENDENT_AMBULATORY_CARE_PROVIDER_SITE_OTHER): Payer: Medicare Other | Admitting: Podiatry

## 2018-05-13 DIAGNOSIS — B351 Tinea unguium: Secondary | ICD-10-CM | POA: Diagnosis not present

## 2018-05-13 DIAGNOSIS — M79674 Pain in right toe(s): Secondary | ICD-10-CM

## 2018-05-13 DIAGNOSIS — M79675 Pain in left toe(s): Secondary | ICD-10-CM | POA: Diagnosis not present

## 2018-05-13 NOTE — Progress Notes (Signed)
Subjective: Jesse [name] presents today for preventative foot care with painful, thick toenails 1-5 b/l that he cannot cut and which interfere with daily activities.  Pain is aggravated when wearing enclosed shoe gear and relieved with periodic professional debridement.  Marletta Lor, MD is his PCP and last dos was 09/02/2017.   Current Outpatient Medications:  .  acetaminophen (TYLENOL) 500 MG tablet, Take by mouth., Disp: , Rfl:  .  bisacodyl (DULCOLAX) 5 MG EC tablet, Dulcolax (bisacodyl) 5 mg tablet,delayed release  Take 4 tablet(s) by oral route between 3-5 pm., Disp: , Rfl:  .  Desvenlafaxine Succinate ER 25 MG TB24, , Disp: , Rfl:  .  docusate sodium (COLACE) 100 MG capsule, Take 1 capsule (100 mg total) by mouth every 12 (twelve) hours. (Patient taking differently: Take 100 mg by mouth daily. ), Disp: 60 capsule, Rfl: 0 .  DULoxetine (CYMBALTA) 30 MG capsule, Take 60 mg by mouth daily. , Disp: , Rfl:  .  escitalopram (LEXAPRO) 5 MG tablet, Take 5 mg by mouth daily., Disp: , Rfl:  .  Glucosamine HCl-MSM (GLUCOSAMINE-MSM PO), Take 1 tablet by mouth daily., Disp: , Rfl:  .  Glucosamine-Chondroitin-MSM 750-400-375 MG TABS, Take by mouth., Disp: , Rfl:  .  ibuprofen (ADVIL,MOTRIN) 200 MG tablet, Take by mouth., Disp: , Rfl:  .  metoCLOPramide (REGLAN) 10 MG tablet, Reglan 10 mg tablet  Take 1 tablet(s) by oral route 1 hour prior to starting the Miralax. You may take the second tablet anytime during the prep if necessary., Disp: , Rfl:  .  mirtazapine (REMERON) 7.5 MG tablet, mirtazapine 7.5 mg tablet  Take 1 tablet every day by oral route at bedtime., Disp: , Rfl:  .  Multiple Vitamin (MULTIVITAMIN) capsule, Take by mouth., Disp: , Rfl:  .  Multiple Vitamins-Minerals (MULTIVITAMIN ADULT PO), Take 1 tablet by mouth daily. One a Day, Disp: , Rfl:  .  Na Sulfate-K Sulfate-Mg Sulf (SUPREP BOWEL PREP KIT) 17.5-3.13-1.6 GM/177ML SOLN, Suprep Bowel Prep Kit 17.5 gram-3.13 gram-1.6 gram oral  solution  Take as directed from colonoscopy instruction sheet., Disp: , Rfl:  .  oxyCODONE-acetaminophen (PERCOCET/ROXICET) 5-325 MG tablet, oxycodone-acetaminophen 5 mg-325 mg tablet, Disp: , Rfl:  .  polyethylene glycol (MIRALAX / GLYCOLAX) packet, Take 51 g by mouth 3 (three) times daily., Disp: 30 each, Rfl: 0 .  polyethylene glycol powder (MIRALAX) powder, Miralax 17 gram/dose oral powder  Take by oral route as directed., Disp: , Rfl:  .  sildenafil (VIAGRA) 50 MG tablet, sildenafil 50 mg tablet, Disp: , Rfl:  .  SYNTHROID 50 MCG tablet, TAKE 1 TABLET DAILY BEFORE BREAKFAST, Disp: 30 tablet, Rfl: 0  Allergies  Allergen Reactions  . Other     Objective:  Vascular Examination: Capillary refill time <3 seconds x 10 digits Dorsalis pedis and Posterior tibial pulses palpable b/l Digital hair absent x 10 digits Skin temperature gradient WNL b/l  Dermatological Examination: Skin with mild age related atrophy.  No edema b/l.  No open wounds b/l.  Toenails 1-5 b/l discolored, thick, dystrophic with subungual debris and pain with palpation to nailbeds due to thickness of nails.  Musculoskeletal: Muscle strength 5/5 to all LE muscle groups  Neurological: Sensation intact with 10 gram monofilament. Vibratory sensation intact.  Assessment: Painful onychomycosis toenails 1-5 b/l   Plan: 1. Toenails 1-5 b/l were debrided in length and girth without iatrogenic bleeding. 2. Patient to continue soft, supportive shoe gear 3. Patient to report any pedal injuries to medical  professional immediately. 4. Follow up 3 months. Patient/POA to call should there be a concern in the interim.

## 2018-05-13 NOTE — Patient Instructions (Signed)

## 2018-06-16 DIAGNOSIS — F330 Major depressive disorder, recurrent, mild: Secondary | ICD-10-CM | POA: Diagnosis not present

## 2018-06-24 IMAGING — DX DG ABDOMEN ACUTE W/ 1V CHEST
4 series · 4 of 4 positions shown · non-contrast
Comparison: Abdominal x-ray from yesterday.

CLINICAL DATA: Sigmoid volvulus.

EXAM:
DG ABDOMEN ACUTE W/ 1V CHEST

[abdomen erect]
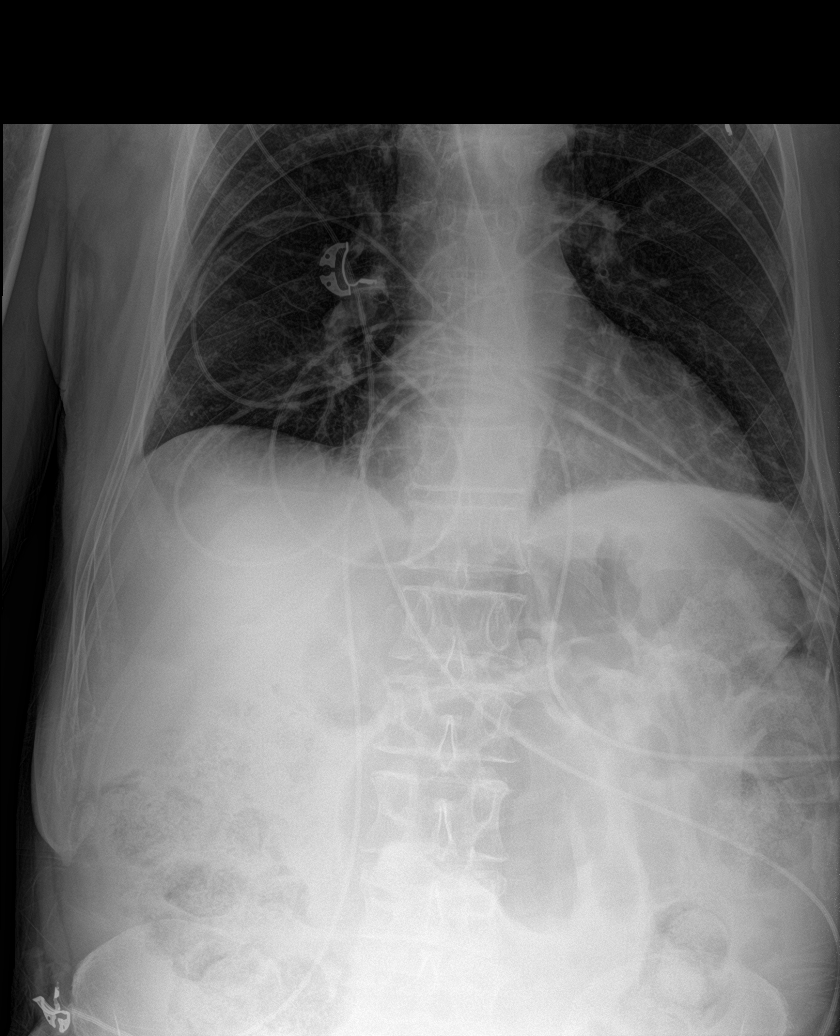

[abdomen supine (1 of 2)]
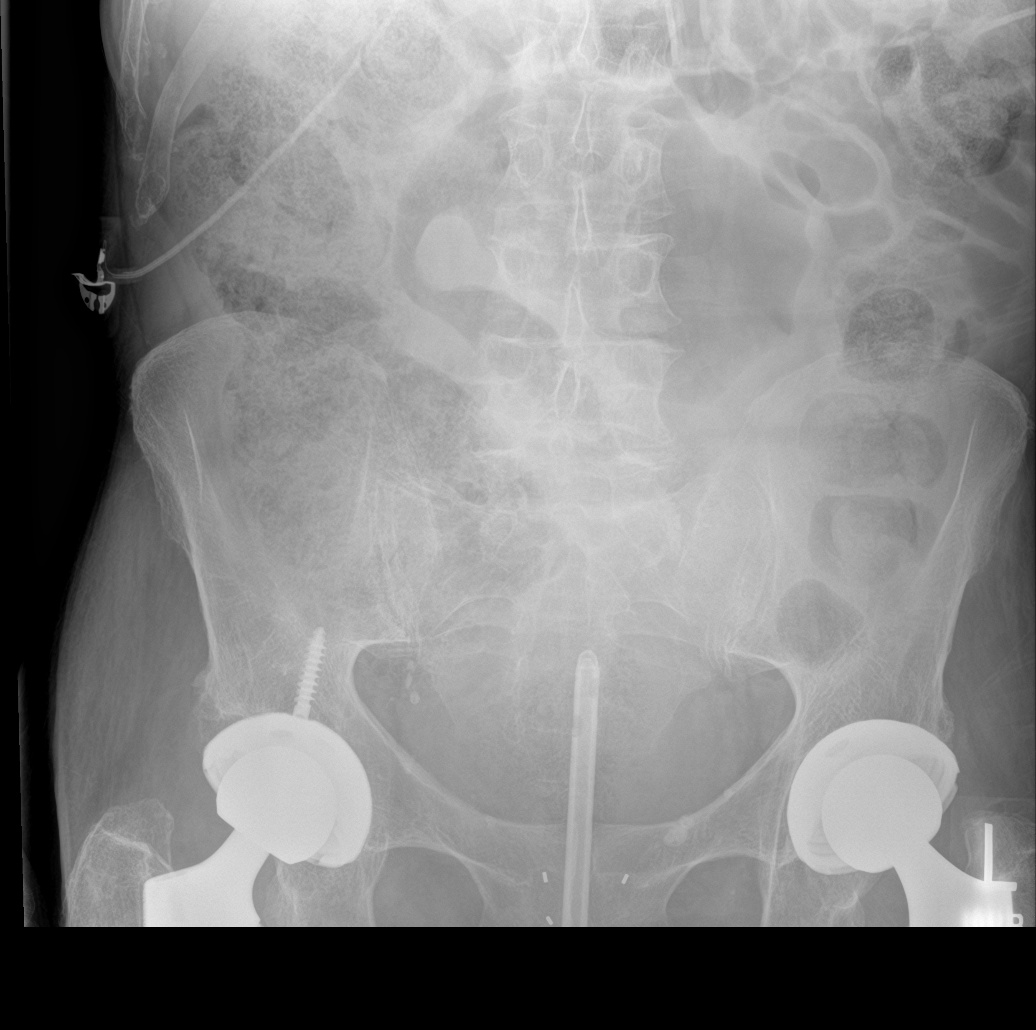

[abdomen supine (2 of 2)]
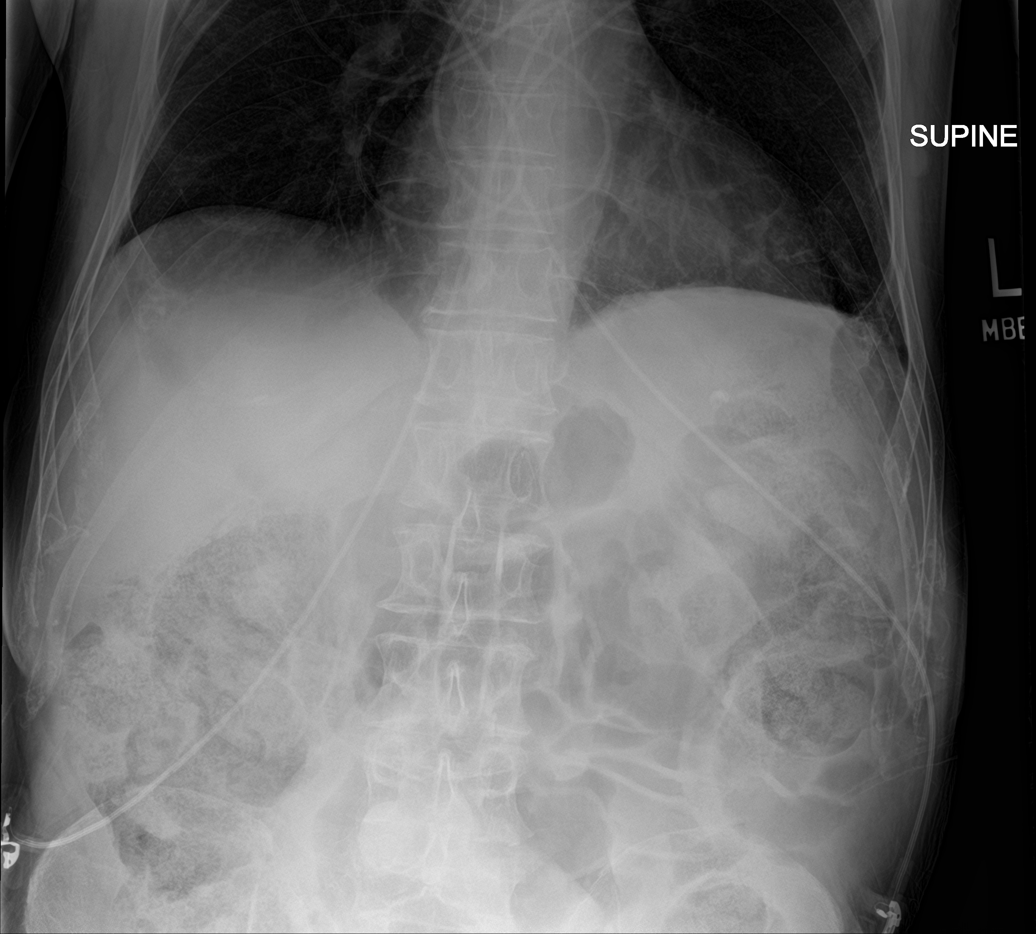

[chest ap]
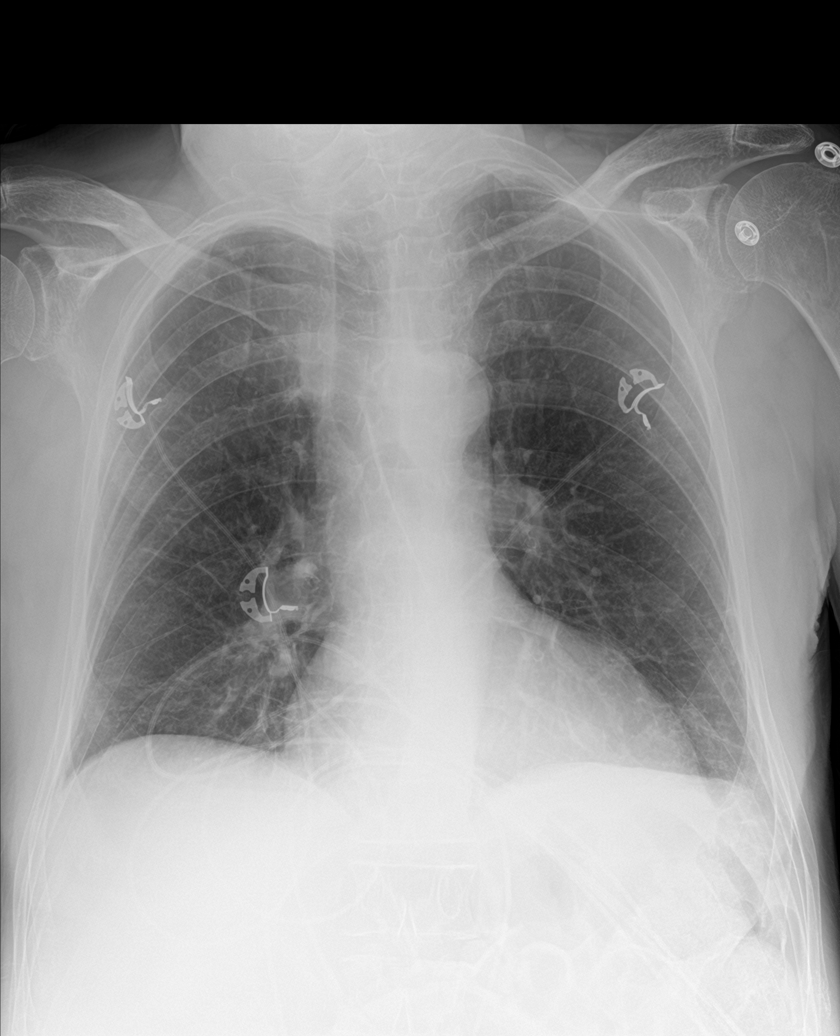

[4 of 4 positions shown; findings below may reference images not displayed]

FINDINGS: Unchanged rectal tube. Mild gaseous distention of the sigmoid colon,
slightly increased when compared to prior study. No definite
recurrent volvulus. Large amount of stool throughout the remaining
colon.

Heart size and mediastinal contours are within normal limits. Both
lungs are clear.
IMPRESSION: 1. Slightly increased gaseous distention of the sigmoid colon
without definite recurrent volvulus.
2.  No active cardiopulmonary disease.

## 2018-06-26 IMAGING — CR DG CHEST 1V
1 series · 1 of 1 positions shown · non-contrast
Comparison: Chest and acute abdomen 09/30/2016

CLINICAL DATA: Pneumonia questioned on recent abdominal films

EXAM:
CHEST  1 VIEW

[chest lat]
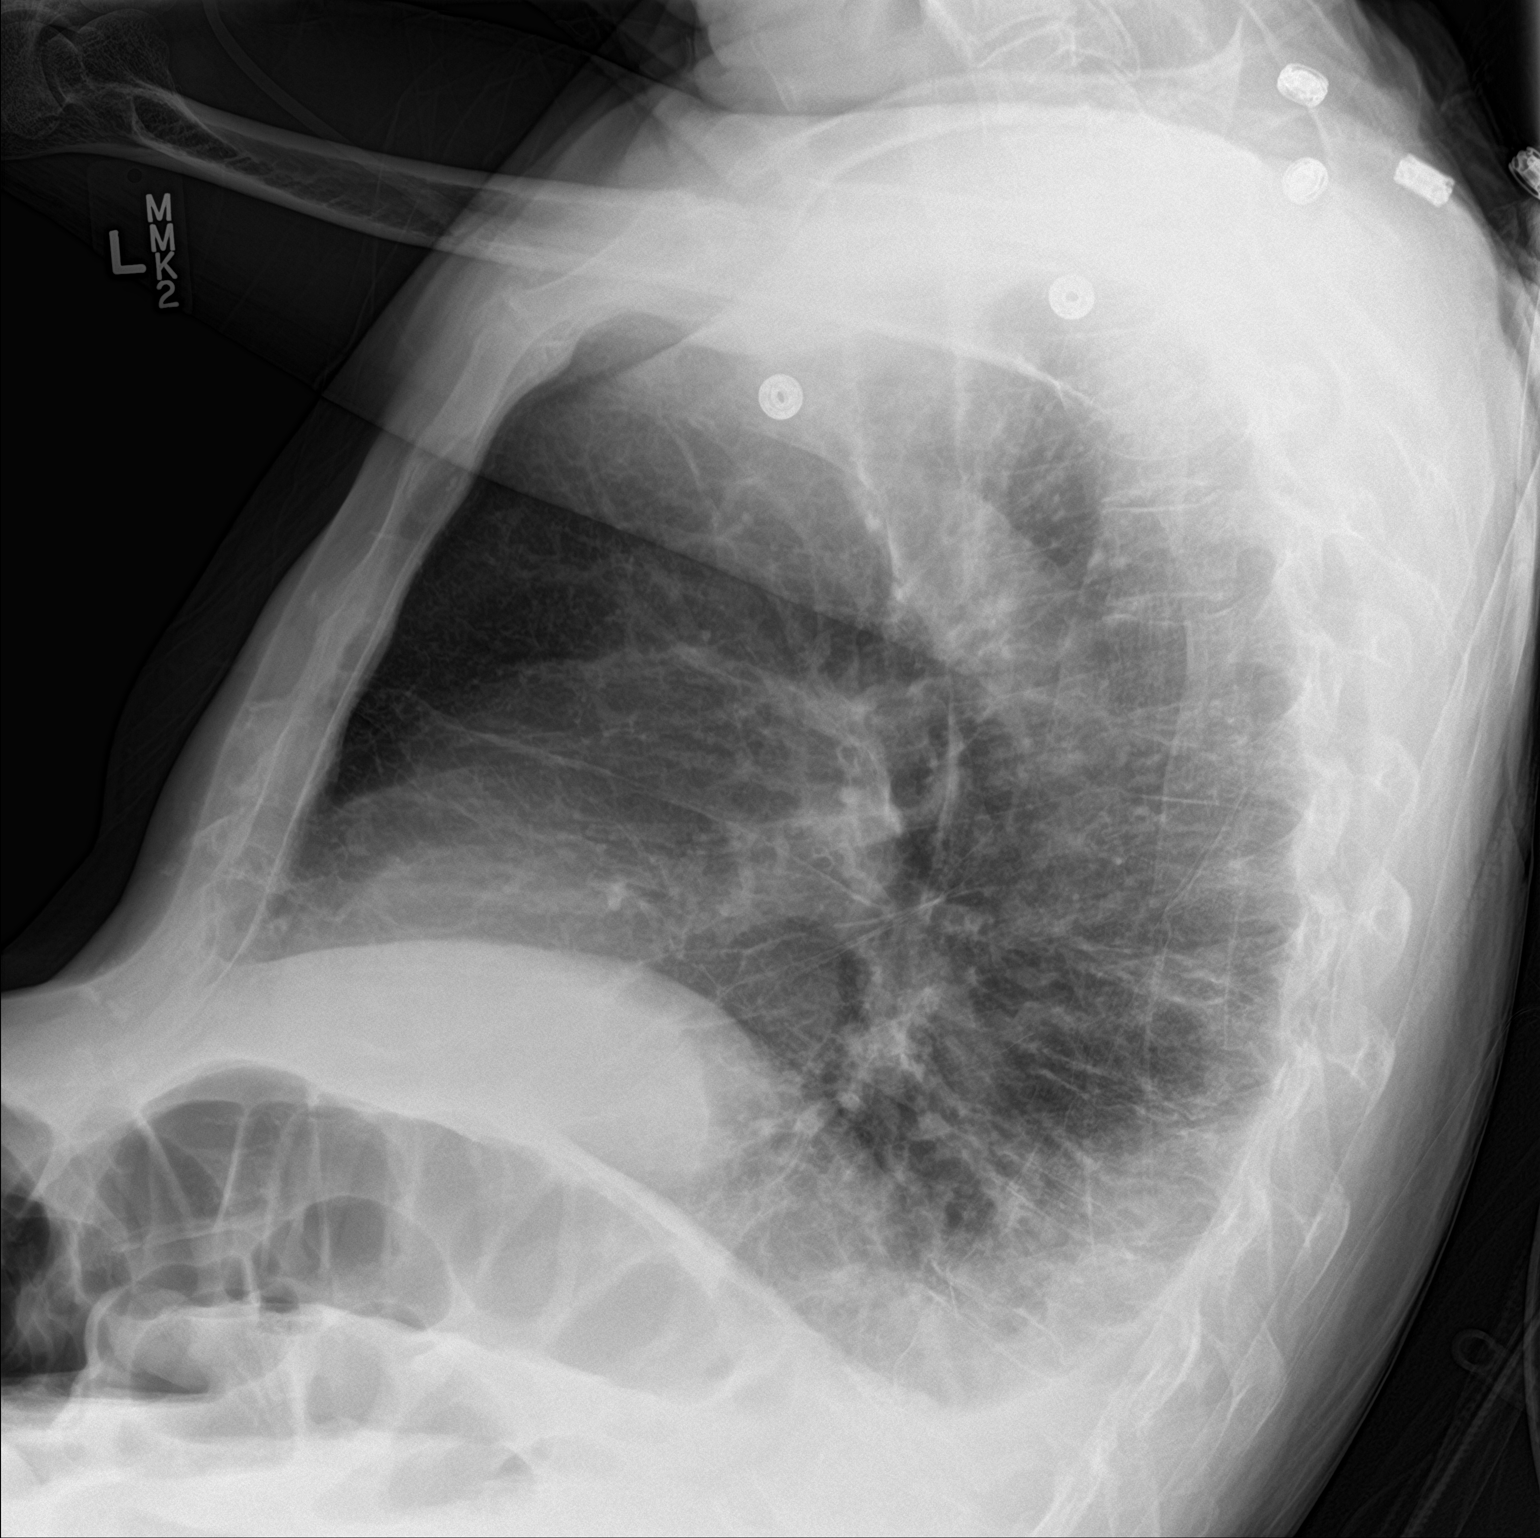

[1 of 1 positions shown; findings below may reference images not displayed]

FINDINGS: Only a lateral view of the chest was obtained. There do appear to be
small pleural effusions with somewhat prominent interstitial
markings in this patient with COPD possibly indicating mild
interstitial edema. No definite pneumonia is seen. No bony
abnormality is noted.
IMPRESSION: Suspect mild interstitial edema with small pleural effusions and
slightly prominent interstitial markings.

## 2018-07-02 DIAGNOSIS — C61 Malignant neoplasm of prostate: Secondary | ICD-10-CM | POA: Diagnosis not present

## 2018-07-02 LAB — PSA: PSA: 0.112

## 2018-07-09 DIAGNOSIS — N5201 Erectile dysfunction due to arterial insufficiency: Secondary | ICD-10-CM | POA: Diagnosis not present

## 2018-07-09 DIAGNOSIS — Z8546 Personal history of malignant neoplasm of prostate: Secondary | ICD-10-CM | POA: Diagnosis not present

## 2018-07-14 ENCOUNTER — Encounter: Payer: Self-pay | Admitting: Psychology

## 2018-07-15 ENCOUNTER — Ambulatory Visit (INDEPENDENT_AMBULATORY_CARE_PROVIDER_SITE_OTHER): Payer: Medicare Other | Admitting: Podiatry

## 2018-07-15 DIAGNOSIS — B351 Tinea unguium: Secondary | ICD-10-CM | POA: Diagnosis not present

## 2018-07-15 DIAGNOSIS — M79674 Pain in right toe(s): Secondary | ICD-10-CM | POA: Diagnosis not present

## 2018-07-15 DIAGNOSIS — M79675 Pain in left toe(s): Secondary | ICD-10-CM

## 2018-07-15 NOTE — Patient Instructions (Signed)

## 2018-07-24 ENCOUNTER — Encounter: Payer: Self-pay | Admitting: Podiatry

## 2018-07-24 NOTE — Progress Notes (Signed)
Subjective:  Patient presents to clinic with cc of  painful, thick, discolored, elongated toenails 1-5 b/l that become tender and cannot cut because of thickness.  Patient feels his toenails are really painful and 3 months is too long between visits for him.  Jesse Lor, MD is his PCP.   Current Outpatient Medications:  .  acetaminophen (TYLENOL) 500 MG tablet, Take by mouth., Disp: , Rfl:  .  bisacodyl (DULCOLAX) 5 MG EC tablet, Dulcolax (bisacodyl) 5 mg tablet,delayed release  Take 4 tablet(s) by oral route between 3-5 pm., Disp: , Rfl:  .  buPROPion (WELLBUTRIN XL) 300 MG 24 hr tablet, , Disp: , Rfl:  .  Desvenlafaxine Succinate ER 25 MG TB24, , Disp: , Rfl:  .  docusate sodium (COLACE) 100 MG capsule, Take 1 capsule (100 mg total) by mouth every 12 (twelve) hours. (Patient taking differently: Take 100 mg by mouth daily. ), Disp: 60 capsule, Rfl: 0 .  DULoxetine (CYMBALTA) 30 MG capsule, Take 60 mg by mouth daily. , Disp: , Rfl:  .  escitalopram (LEXAPRO) 5 MG tablet, Take 5 mg by mouth daily., Disp: , Rfl:  .  Glucosamine HCl-MSM (GLUCOSAMINE-MSM PO), Take 1 tablet by mouth daily., Disp: , Rfl:  .  Glucosamine-Chondroitin-MSM 750-400-375 MG TABS, Take by mouth., Disp: , Rfl:  .  ibuprofen (ADVIL,MOTRIN) 200 MG tablet, Take by mouth., Disp: , Rfl:  .  metoCLOPramide (REGLAN) 10 MG tablet, Reglan 10 mg tablet  Take 1 tablet(s) by oral route 1 hour prior to starting the Miralax. You may take the second tablet anytime during the prep if necessary., Disp: , Rfl:  .  mirtazapine (REMERON) 7.5 MG tablet, mirtazapine 7.5 mg tablet  Take 1 tablet every day by oral route at bedtime., Disp: , Rfl:  .  Multiple Vitamin (MULTIVITAMIN) capsule, Take by mouth., Disp: , Rfl:  .  Multiple Vitamins-Minerals (MULTIVITAMIN ADULT PO), Take 1 tablet by mouth daily. One a Day, Disp: , Rfl:  .  Na Sulfate-K Sulfate-Mg Sulf (SUPREP BOWEL PREP KIT) 17.5-3.13-1.6 GM/177ML SOLN, Suprep Bowel Prep Kit 17.5  gram-3.13 gram-1.6 gram oral solution  Take as directed from colonoscopy instruction sheet., Disp: , Rfl:  .  oxyCODONE-acetaminophen (PERCOCET/ROXICET) 5-325 MG tablet, oxycodone-acetaminophen 5 mg-325 mg tablet, Disp: , Rfl:  .  polyethylene glycol (MIRALAX / GLYCOLAX) packet, Take 51 g by mouth 3 (three) times daily., Disp: 30 each, Rfl: 0 .  polyethylene glycol powder (MIRALAX) powder, Miralax 17 gram/dose oral powder  Take by oral route as directed., Disp: , Rfl:  .  sildenafil (VIAGRA) 50 MG tablet, sildenafil 50 mg tablet, Disp: , Rfl:  .  SYNTHROID 50 MCG tablet, TAKE 1 TABLET DAILY BEFORE BREAKFAST, Disp: 30 tablet, Rfl: 0   Allergies  Allergen Reactions  . Other      Objective:  Physical Examination: Neurovascular status intact with thick, discolored brittle toenails 1-5 b/l  No edema noted bilaterally.  No open wounds noted bilaterally.  Muscle strength noted 5/5 to all lower extremity muscle groups.  Assessment: Mycotic nail infection with pain 1-5 b/l  Plan:  Debride painful toenails 1-5 b/l with no iatrogenic bleeding.  Patient to continue soft supportive shoe gear daily.  Patient to report any pedal injuries to medical professional immediately.  Follow up 10 weeks.  Patient/POA to call should there be any concern in the interim.

## 2018-07-30 DIAGNOSIS — F330 Major depressive disorder, recurrent, mild: Secondary | ICD-10-CM | POA: Diagnosis not present

## 2018-07-31 ENCOUNTER — Ambulatory Visit (INDEPENDENT_AMBULATORY_CARE_PROVIDER_SITE_OTHER): Payer: Medicare Other | Admitting: Cardiovascular Disease

## 2018-07-31 ENCOUNTER — Encounter: Payer: Self-pay | Admitting: Cardiovascular Disease

## 2018-07-31 VITALS — BP 118/64 | HR 55 | Ht 70.0 in | Wt 165.8 lb

## 2018-07-31 DIAGNOSIS — Z8679 Personal history of other diseases of the circulatory system: Secondary | ICD-10-CM | POA: Diagnosis not present

## 2018-07-31 NOTE — Patient Instructions (Signed)
Medication Instructions:  Your physician recommends that you continue on your current medications as directed. Please refer to the Current Medication list given to you today.  If you need a refill on your cardiac medications before your next appointment, please call your pharmacy.    Follow-Up: At [facility], you and your health needs are our priority.  As part of our continuing mission to provide you with exceptional heart care, we have created designated Provider Care Teams.  These Care Teams include your primary Cardiologist (physician) and Advanced Practice Providers (APPs -  Physician Assistants and Nurse Practitioners) who all work together to provide you with the care you need, when you need it. . Follow-up with Dr. Sallyanne Kuster as needed.  Any Other Special Instructions Will Be Listed Below (If Applicable). None

## 2018-07-31 NOTE — Progress Notes (Signed)
Cardiology Consultation Note    Date:  07/31/2018   ID:  Jesse [name], DOB 08-Aug-1940, MRN [id number]  PCP:  Rayann Heman, DO  Cardiologist:   Sanda Klein, MD  Consultation requested by: Dr. Lyla Glassing Chief Complaint  Patient presents with  . New Patient (Initial Visit)    preop evaluation for history of atrial fibrillation    History of Present Illness:  Jesse [name] is a 78 y.o. male with a remote history of a single episode of paroxysmal atrial fibrillation that occurred in the postoperative setting (cervical laminectomy 2013). He was briefly treated with amiodarone and anticoagulants but since no arrhythmia recurrence was detected he has stopped all cardiac medications. He does not have known structural heart disease.  As always, Jesse [name] is accompanied by his wife, who is a retired Marine scientist.  Last fall he had sigmoid volvulus and underwent a sigmoid colectomy.  He lost about 15 pounds of weight.  He has recovered most of it back.  He is now seeing Dr. Peggye Pitt and with his advice and changes in medication, his depression is better controlled.  He has a history of prostate TURP surgery as well as multiple orthopedic procedures. He had radiation therapy for prostate carcinoma. He has treated hypothyroidism, but does not have any other chronic medical problems.  The patient specifically denies any chest pain at rest exertion, dyspnea at rest or with exertion, orthopnea, paroxysmal nocturnal dyspnea, syncope, palpitations, focal neurological deficits, intermittent claudication, lower extremity edema, unexplained weight gain, cough, hemoptysis or wheezing.   Past Medical History:  Diagnosis Date  . Arthritis   . Atrial fibrillation ([place])   . Complication of anesthesia    Atrial fibrillation during surgery   . Dementia ([place])   . Depression   . Hard of hearing   . Hx of constipation   . Osteoporosis    DEXA 11/09/10. Completed treatemtn with Fosamax.  . Prostate cancer ([place])   .  Restless legs   . Thyroid disease    hypothyroidism  . Vitiligo     Past Surgical History:  Procedure Laterality Date  . BACK SURGERY    . COLONOSCOPY    . FLEXIBLE SIGMOIDOSCOPY N/A 09/27/2017   Procedure: FLEXIBLE SIGMOIDOSCOPY;  Surgeon: Gatha Mayer, MD;  Location: [facility] ENDOSCOPY;  Service: Endoscopy;  Laterality: N/A;  . JOINT REPLACEMENT    . LAMINECTOMY  10/2011   cervical and lumbar laminectomy  . PROSTATE BIOPSY  07/10/2011  . PROSTATE BIOPSY  09/22/15  . TOTAL HIP ARTHROPLASTY Right 02/29/2012  . TOTAL HIP ARTHROPLASTY Right 07/04/2016   Procedure: RIGHT TOTAL HIP ARTHROPLASTY ANTERIOR APPROACH;  Surgeon: Rod Can, MD;  Location: [place];  Service: Orthopedics;  Laterality: Right;  . TRANSURETHRAL RESECTION OF PROSTATE      Current Medications: Outpatient Medications Prior to Visit  Medication Sig Dispense Refill  . buPROPion (WELLBUTRIN XL) 300 MG 24 hr tablet Take 300 mg by mouth daily.     Marland Kitchen docusate sodium (COLACE) 100 MG capsule Take 1 capsule (100 mg total) by mouth every 12 (twelve) hours. (Patient taking differently: Take 100 mg by mouth daily. ) 60 capsule 0  . Glucosamine-Chondroitin-MSM 264-158-309 MG TABS Take by mouth.    . mirtazapine (REMERON) 7.5 MG tablet mirtazapine 7.5 mg tablet  Take 1 tablet every day by oral route at bedtime.    . Multiple Vitamins-Minerals (MULTIVITAMIN ADULT PO) Take 1 tablet by mouth daily. One a Day    . polyethylene glycol powder (  MIRALAX) powder Miralax 17 gram/dose oral powder  Take by oral route as directed.    . sildenafil (VIAGRA) 50 MG tablet sildenafil 50 mg tablet    . SYNTHROID 50 MCG tablet TAKE 1 TABLET DAILY BEFORE BREAKFAST 30 tablet 0  . acetaminophen (TYLENOL) 500 MG tablet Take by mouth.    . bisacodyl (DULCOLAX) 5 MG EC tablet Dulcolax (bisacodyl) 5 mg tablet,delayed release  Take 4 tablet(s) by oral route between 3-5 pm.    . Desvenlafaxine Succinate ER 25 MG TB24     . DULoxetine (CYMBALTA) 30 MG capsule  Take 60 mg by mouth daily.     Marland Kitchen escitalopram (LEXAPRO) 5 MG tablet Take 5 mg by mouth daily.    . Glucosamine HCl-MSM (GLUCOSAMINE-MSM PO) Take 1 tablet by mouth daily.    Marland Kitchen ibuprofen (ADVIL,MOTRIN) 200 MG tablet Take by mouth.    . metoCLOPramide (REGLAN) 10 MG tablet Reglan 10 mg tablet  Take 1 tablet(s) by oral route 1 hour prior to starting the Miralax. You may take the second tablet anytime during the prep if necessary.    . Multiple Vitamin (MULTIVITAMIN) capsule Take by mouth.    . Na Sulfate-K Sulfate-Mg Sulf (SUPREP BOWEL PREP KIT) 17.5-3.13-1.6 GM/177ML SOLN Suprep Bowel Prep Kit 17.5 gram-3.13 gram-1.6 gram oral solution  Take as directed from colonoscopy instruction sheet.    Marland Kitchen oxyCODONE-acetaminophen (PERCOCET/ROXICET) 5-325 MG tablet oxycodone-acetaminophen 5 mg-325 mg tablet    . polyethylene glycol (MIRALAX / GLYCOLAX) packet Take 51 g by mouth 3 (three) times daily. 30 each 0   No facility-administered medications prior to visit.      Allergies:   Other   Social History   Socioeconomic History  . Marital status: Married    Spouse name: Not on file  . Number of children: Not on file  . Years of education: Not on file  . Highest education level: Not on file  Occupational History  . Not on file  Social Needs  . Financial resource strain: Not on file  . Food insecurity:    Worry: Not on file    Inability: Not on file  . Transportation needs:    Medical: Not on file    Non-medical: Not on file  Tobacco Use  . Smoking status: Former Smoker    Types: Cigarettes    Last attempt to quit: 05/28/1974    Years since quitting: 44.2  . Smokeless tobacco: Never Used  . Tobacco comment: smoked very little   Substance and Sexual Activity  . Alcohol use: Yes    Comment: 4-5 standard drinks per week/ stopped drinkin   . Drug use: No  . Sexual activity: Yes  Lifestyle  . Physical activity:    Days per week: Not on file    Minutes per session: Not on file  . Stress: Not  on file  Relationships  . Social connections:    Talks on phone: Not on file    Gets together: Not on file    Attends religious service: Not on file    Active member of club or organization: Not on file    Attends meetings of clubs or organizations: Not on file    Relationship status: Not on file  Other Topics Concern  . Not on file  Social History Narrative  . Not on file     Family History:  The patient's family history includes Atrial fibrillation in his mother and sister.   ROS:   Please see the  history of present illness.    ROS other systems are reviewed and are negative   PHYSICAL EXAM:   VS:  BP 118/64   Pulse (!) 55   Ht 5' 10" (1.778 m)   Wt 165 lb 12.8 oz (75.2 kg)   SpO2 99%   BMI 23.79 kg/m     General: Alert, oriented x3, no distress, lean Head: no evidence of trauma, PERRL, EOMI, no exophtalmos or lid lag, no myxedema, no xanthelasma; normal ears, nose and oropharynx Neck: normal jugular venous pulsations and no hepatojugular reflux; brisk carotid pulses without delay and no carotid bruits Chest: clear to auscultation, no signs of consolidation by percussion or palpation, normal fremitus, symmetrical and full respiratory excursions Cardiovascular: normal position and quality of the apical impulse, regular rhythm, normal first and second heart sounds, no murmurs, rubs or gallops Abdomen: no tenderness or distention, no masses by palpation, no abnormal pulsatility or arterial bruits, normal bowel sounds, no hepatosplenomegaly Extremities: no clubbing, cyanosis or edema; 2+ radial, ulnar and brachial pulses bilaterally; 2+ right femoral, posterior tibial and dorsalis pedis pulses; 2+ left femoral, posterior tibial and dorsalis pedis pulses; no subclavian or femoral bruits Neurological: grossly nonfocal Psych: Depressed mood, very withdrawn, speaks monosyllabically.   Wt Readings from Last 3 Encounters:  07/31/18 165 lb 12.8 oz (75.2 kg)  09/30/17 160 lb 7.9 oz  (72.8 kg)  08/27/17 161 lb (73 kg)      Studies/Labs Reviewed:   EKG:  EKG is ordered today.  Today's tracing shows sinus rhythm and is completely normal.  Recent Labs: 10/01/2017: ALT 14; BUN 11; Creatinine, Ser 0.79; Hemoglobin 11.6; Platelets 161; Potassium 3.2; Sodium 139    ASSESSMENT:    1. History of atrial fibrillation      PLAN:  In order of problems listed above:  1. Hx of postop atrial fibrillation: Was recorded once in 2013 but has not occurred since, although he has undergone several surgical procedures in the meantime, included sigmoid colectomy in the last few months.  I do not think he requires anticoagulation therapy or antiarrhythmics.  I think he can follow-up as needed. 2. Hypothyroidism: On replacement therapy. 3. Depression and/or dementia: sees Dr. Jaynee Eagles and Dr. Peggye Pitt    Medication Adjustments/Labs and Tests Ordered: Current medicines are reviewed at length with the patient today.  Concerns regarding medicines are outlined above.  Medication changes, Labs and Tests ordered today are listed in the Patient Instructions below. Patient Instructions  Medication Instructions:  Your physician recommends that you continue on your current medications as directed. Please refer to the Current Medication list given to you today.  If you need a refill on your cardiac medications before your next appointment, please call your pharmacy.    Follow-Up: At [facility], you and your health needs are our priority.  As part of our continuing mission to provide you with exceptional heart care, we have created designated Provider Care Teams.  These Care Teams include your primary Cardiologist (physician) and Advanced Practice Providers (APPs -  Physician Assistants and Nurse Practitioners) who all work together to provide you with the care you need, when you need it. . Follow-up with Dr. Sallyanne Kuster as needed.  Any Other Special Instructions Will Be Listed Below (If  Applicable). None      Signed, Sanda Klein, MD  07/31/2018 6:29 PM    [address], [place], [place]  [address] Phone: [phone]; Fax: [phone]

## 2018-08-05 ENCOUNTER — Encounter: Payer: Self-pay | Admitting: Family Medicine

## 2018-08-05 DIAGNOSIS — C44329 Squamous cell carcinoma of skin of other parts of face: Secondary | ICD-10-CM | POA: Diagnosis not present

## 2018-09-17 ENCOUNTER — Other Ambulatory Visit: Payer: Self-pay

## 2018-09-17 ENCOUNTER — Ambulatory Visit (INDEPENDENT_AMBULATORY_CARE_PROVIDER_SITE_OTHER): Payer: Medicare Other | Admitting: Podiatry

## 2018-09-17 ENCOUNTER — Encounter: Payer: Self-pay | Admitting: Podiatry

## 2018-09-17 VITALS — Temp 97.5°F

## 2018-09-17 DIAGNOSIS — M79675 Pain in left toe(s): Secondary | ICD-10-CM

## 2018-09-17 DIAGNOSIS — M79674 Pain in right toe(s): Secondary | ICD-10-CM

## 2018-09-17 DIAGNOSIS — B351 Tinea unguium: Secondary | ICD-10-CM | POA: Diagnosis not present

## 2018-09-23 ENCOUNTER — Encounter: Payer: Self-pay | Admitting: Podiatry

## 2018-09-23 NOTE — Progress Notes (Signed)
Subjective: Jesse [name] presents today with painful, thick toenails 1-5 b/l that he cannot cut and which interfere with daily activities.  Pain is aggravated when wearing enclosed shoe gear.  Lenon Curt Sirike T, DO is his PCP.    Current Outpatient Medications:  .  buPROPion (WELLBUTRIN XL) 300 MG 24 hr tablet, Take 300 mg by mouth daily. , Disp: , Rfl:  .  docusate sodium (COLACE) 100 MG capsule, Take 1 capsule (100 mg total) by mouth every 12 (twelve) hours. (Patient taking differently: Take 100 mg by mouth daily. ), Disp: 60 capsule, Rfl: 0 .  Glucosamine-Chondroitin-MSM 750-400-375 MG TABS, Take by mouth., Disp: , Rfl:  .  mirtazapine (REMERON) 7.5 MG tablet, mirtazapine 7.5 mg tablet  Take 1 tablet every day by oral route at bedtime., Disp: , Rfl:  .  Multiple Vitamins-Minerals (MULTIVITAMIN ADULT PO), Take 1 tablet by mouth daily. One a Day, Disp: , Rfl:  .  polyethylene glycol powder (MIRALAX) powder, Miralax 17 gram/dose oral powder  Take by oral route as directed., Disp: , Rfl:  .  sildenafil (VIAGRA) 50 MG tablet, sildenafil 50 mg tablet, Disp: , Rfl:  .  SYNTHROID 50 MCG tablet, TAKE 1 TABLET DAILY BEFORE BREAKFAST, Disp: 30 tablet, Rfl: 0  Allergies  Allergen Reactions  . Other     Objective:  Vascular Examination: Capillary refill time immediate x 10 digits.  Dorsalis pedis and Posterior tibial pulses palpable b/l.  Digital hair absent  x 10 digits. . Skin temperature gradient WNL b/l.  Dermatological Examination: Skin with mild atrophy noted b/l.  Toenails 1-5 b/l discolored, thick, dystrophic with subungual debris and pain with palpation to nailbeds due to thickness of nails.  No open wounds.  No interdigital macerations.  Musculoskeletal: Muscle strength 5/5 to all LE muscle groups  Neurological: Sensation intact with 10 gram monofilament.  Vibratory sensation intact.  Assessment: Painful onychomycosis toenails 1-5 b/l   Plan: 1. Toenails 1-5 b/l  were debrided in length and girth without iatrogenic bleeding. 2. Patient to continue soft, supportive shoe gear daily. 3. Patient to report any pedal injuries to medical professional immediately. 4. Follow up 10 weeks. 5. Patient/POA to call should there be a concern in the interim.

## 2018-09-30 DIAGNOSIS — H43811 Vitreous degeneration, right eye: Secondary | ICD-10-CM | POA: Diagnosis not present

## 2018-09-30 DIAGNOSIS — D3132 Benign neoplasm of left choroid: Secondary | ICD-10-CM | POA: Diagnosis not present

## 2018-09-30 DIAGNOSIS — H5203 Hypermetropia, bilateral: Secondary | ICD-10-CM | POA: Diagnosis not present

## 2018-09-30 DIAGNOSIS — H25813 Combined forms of age-related cataract, bilateral: Secondary | ICD-10-CM | POA: Diagnosis not present

## 2018-10-30 DIAGNOSIS — F330 Major depressive disorder, recurrent, mild: Secondary | ICD-10-CM | POA: Diagnosis not present

## 2018-11-26 ENCOUNTER — Encounter: Payer: Self-pay | Admitting: Podiatry

## 2018-11-26 ENCOUNTER — Ambulatory Visit (INDEPENDENT_AMBULATORY_CARE_PROVIDER_SITE_OTHER): Payer: Medicare Other | Admitting: Podiatry

## 2018-11-26 ENCOUNTER — Other Ambulatory Visit: Payer: Self-pay

## 2018-11-26 VITALS — Temp 97.2°F

## 2018-11-26 DIAGNOSIS — S90221A Contusion of right lesser toe(s) with damage to nail, initial encounter: Secondary | ICD-10-CM

## 2018-11-26 DIAGNOSIS — M79675 Pain in left toe(s): Secondary | ICD-10-CM | POA: Diagnosis not present

## 2018-11-26 DIAGNOSIS — M79674 Pain in right toe(s): Secondary | ICD-10-CM | POA: Diagnosis not present

## 2018-11-26 DIAGNOSIS — B351 Tinea unguium: Secondary | ICD-10-CM

## 2018-11-26 DIAGNOSIS — L601 Onycholysis: Secondary | ICD-10-CM | POA: Diagnosis not present

## 2018-11-26 NOTE — Patient Instructions (Addendum)
EPSOM SALT FOOT SOAK INSTRUCTIONS  Soak once per day for one week.  1.  Place 1/4 cup of epsom salts in 2 quarts of warm tap water. IF YOU ARE DIABETIC, OR HAVE NEUROPATHY,  CHECK THE TEMPERATURE OF THE WATER WITH YOUR ELBOW.  2.  Submerge your foot/feet in the solution and soak for 10-15 minutes.      3.  Next, remove your foot or feet from solution, blot dry the affected area.    4.  Apply antibiotic ointment and cover with fabric band-aid.   5.  This soak should be done once a day for 10 days.   6.  Monitor for any signs/symptoms of infection such as redness, swelling, odor, drainage, increased pain,  or non-healing of digit.   7.  Please do not hesitate to call the office and speak to a Nurse or Doctor if you have questions.   8.  If you experience fever, chills, nightsweats, nausea or vomiting with worsening of digit, please go to the emergency room.    Onychomycosis/Fungal Toenails  WHAT IS IT? An infection that lies within the keratin of your nail plate that is caused by a fungus.  WHY ME? Fungal infections affect all ages, sexes, races, and creeds.  There may be many factors that predispose you to a fungal infection such as age, coexisting medical conditions such as diabetes, or an autoimmune disease; stress, medications, fatigue, genetics, etc.  Bottom line: fungus thrives in a warm, moist environment and your shoes offer such a location.  IS IT CONTAGIOUS? Theoretically, yes.  You do not want to share shoes, nail clippers or files with someone who has fungal toenails.  Walking around barefoot in the same room or sleeping in the same bed is unlikely to transfer the organism.  It is important to realize, however, that fungus can spread easily from one nail to the next on the same foot.  HOW DO WE TREAT THIS?  There are several ways to treat this condition.  Treatment may depend on many factors such as age, medications, pregnancy, liver and kidney conditions, etc.  It is best to  ask your doctor which options are available to you.  1. No treatment.   Unlike many other medical concerns, you can live with this condition.  However for many people this can be a painful condition and may lead to ingrown toenails or a bacterial infection.  It is recommended that you keep the nails cut short to help reduce the amount of fungal nail. 2. Topical treatment.  These range from herbal remedies to prescription strength nail lacquers.  About 40-50% effective, topicals require twice daily application for approximately 9 to 12 months or until an entirely new nail has grown out.  The most effective topicals are medical grade medications available through physicians offices. 3. Oral antifungal medications.  With an 80-90% cure rate, the most common oral medication requires 3 to 4 months of therapy and stays in your system for a year as the new nail grows out.  Oral antifungal medications do require blood work to make sure it is a safe drug for you.  A liver function panel will be performed prior to starting the medication and after the first month of treatment.  It is important to have the blood work performed to avoid any harmful side effects.  In general, this medication safe but blood work is required. 4. Laser Therapy.  This treatment is performed by applying a specialized laser to the  affected nail plate.  This therapy is noninvasive, fast, and non-painful.  It is not covered by insurance and is therefore, out of pocket.  The results have been very good with a 80-95% cure rate.  The [facility] is the only practice in the area to offer this therapy. 5. Permanent Nail Avulsion.  Removing the entire nail so that a new nail will not grow back.

## 2018-11-30 NOTE — Progress Notes (Signed)
Subjective: Jesse [name] presents today with painful, thick toenails 1-5 b/l that he cannot cut and which interfere with daily activities.  Pain is aggravated when wearing enclosed shoe gear.  Wife is present during the visit. She relates he has a spot on his right 2nd toe. She relates no falls.  She thinks he may have stubbed his toe.   Lenon Curt Sirike T, DO is his PCP.    Current Outpatient Medications:  .  buPROPion (WELLBUTRIN XL) 150 MG 24 hr tablet, Take 150 mg by mouth every morning., Disp: , Rfl:  .  buPROPion (WELLBUTRIN XL) 300 MG 24 hr tablet, Take 300 mg by mouth daily. , Disp: , Rfl:  .  docusate sodium (COLACE) 100 MG capsule, Take 1 capsule (100 mg total) by mouth every 12 (twelve) hours. (Patient taking differently: Take 100 mg by mouth daily. ), Disp: 60 capsule, Rfl: 0 .  Glucosamine-Chondroitin-MSM 750-400-375 MG TABS, Take by mouth., Disp: , Rfl:  .  metoCLOPramide (REGLAN) 10 MG tablet, TAKE ONE TABLET BY MOUTH ONE HOUR PRIOR TO STARTING THE MIRALAX MAY TAKE THE SECOND TABLET ANYTIME DURING THE PREP IF NECESSARY, Disp: , Rfl:  .  mirtazapine (REMERON) 7.5 MG tablet, mirtazapine 7.5 mg tablet  Take 1 tablet every day by oral route at bedtime., Disp: , Rfl:  .  Multiple Vitamins-Minerals (MULTIVITAMIN ADULT PO), Take 1 tablet by mouth daily. One a Day, Disp: , Rfl:  .  polyethylene glycol powder (MIRALAX) powder, Miralax 17 gram/dose oral powder  Take by oral route as directed., Disp: , Rfl:  .  sildenafil (VIAGRA) 50 MG tablet, sildenafil 50 mg tablet, Disp: , Rfl:  .  SUPREP BOWEL PREP KIT 17.5-3.13-1.6 GM/177ML SOLN, See admin instructions., Disp: , Rfl:  .  SYNTHROID 50 MCG tablet, TAKE 1 TABLET DAILY BEFORE BREAKFAST, Disp: 30 tablet, Rfl: 0  Allergies  Allergen Reactions  . Other     Objective: Vitals:   11/26/18 0858  Temp: (!) 97.2 F (36.2 C)    Vascular Examination: Capillary refill time immediate x 10 digits  Dorsalis pedis and Posterior tibial pulses  palpable b/l.  Digital hair absent x 10 digits.  Skin temperature gradient WNL b/l.  Dermatological Examination: Skin with mild atrophy noted b/l.  Toenails 1-5 b/l discolored, thick, dystrophic with subungual debris and pain with palpation to nailbeds due to thickness of nails.  Right great toe nailplate loose with old subungual seroma. Onycholysis of distal 2/3 of nailplate. No erythema, no edema, no drainage.   Subacute subungual hematoma right 2nd digit with dark heme. Nailplate remains intact. No erythema, no edema, no drainage, no flocculence. No tenderness to palpation.  Musculoskeletal: Muscle strength 5/5 to all LE muscle groups.  No gross bony deformities b/l.  Neurological: Sensation intact with 10 gram monofilament.  Vibratory sensation intact.  Assessment: Painful onychomycosis toenails 1-5 b/l Subungual hematoma right 2nd digit Onycholysis right great toe  Plan: 1. Toenails 1-5 b/l were debrided in length and girth without iatrogenic bleeding. 2. Debride onycholytic nailplate to level of adherence. TAO applied to digit. Wife instructed to apply TAO to digit once daily for one week. 3. Monitor right 2nd digit for any changes. 4. Patient to continue soft, supportive shoe gear daily. 5. Patient to report any pedal injuries to medical professional immediately. 6. Follow up 10 weeks.  7. Patient/POA to call should there be a concern in the interim.

## 2019-01-06 DIAGNOSIS — C61 Malignant neoplasm of prostate: Secondary | ICD-10-CM | POA: Diagnosis not present

## 2019-01-09 DIAGNOSIS — N5201 Erectile dysfunction due to arterial insufficiency: Secondary | ICD-10-CM | POA: Diagnosis not present

## 2019-01-09 DIAGNOSIS — C61 Malignant neoplasm of prostate: Secondary | ICD-10-CM | POA: Diagnosis not present

## 2019-01-12 ENCOUNTER — Ambulatory Visit (INDEPENDENT_AMBULATORY_CARE_PROVIDER_SITE_OTHER): Payer: Medicare Other | Admitting: Family Medicine

## 2019-01-12 ENCOUNTER — Other Ambulatory Visit: Payer: Self-pay

## 2019-01-12 ENCOUNTER — Encounter: Payer: Self-pay | Admitting: Family Medicine

## 2019-01-12 VITALS — BP 108/64 | HR 64 | Temp 98.2°F | Ht 70.0 in | Wt 168.2 lb

## 2019-01-12 DIAGNOSIS — F322 Major depressive disorder, single episode, severe without psychotic features: Secondary | ICD-10-CM

## 2019-01-12 DIAGNOSIS — F324 Major depressive disorder, single episode, in partial remission: Secondary | ICD-10-CM | POA: Diagnosis not present

## 2019-01-12 DIAGNOSIS — C61 Malignant neoplasm of prostate: Secondary | ICD-10-CM | POA: Diagnosis not present

## 2019-01-12 DIAGNOSIS — H547 Unspecified visual loss: Secondary | ICD-10-CM

## 2019-01-12 DIAGNOSIS — Z85828 Personal history of other malignant neoplasm of skin: Secondary | ICD-10-CM | POA: Diagnosis not present

## 2019-01-12 DIAGNOSIS — F419 Anxiety disorder, unspecified: Secondary | ICD-10-CM

## 2019-01-12 DIAGNOSIS — E039 Hypothyroidism, unspecified: Secondary | ICD-10-CM

## 2019-01-12 DIAGNOSIS — Z13220 Encounter for screening for lipoid disorders: Secondary | ICD-10-CM

## 2019-01-12 DIAGNOSIS — N529 Male erectile dysfunction, unspecified: Secondary | ICD-10-CM | POA: Diagnosis not present

## 2019-01-12 DIAGNOSIS — M1611 Unilateral primary osteoarthritis, right hip: Secondary | ICD-10-CM

## 2019-01-12 DIAGNOSIS — K5909 Other constipation: Secondary | ICD-10-CM

## 2019-01-12 NOTE — Progress Notes (Signed)
Chief Complaint:  Jesse [name] is a 78 y.o. male who presents today with a chief complaint of skin cancer and to transfer care to this office.   Assessment/Plan:  History of skin cancer Referral placed to dermatology.  Erectile dysfunction Stable.  Continue Viagra as needed per urology.  Hypothyroidism Continue Synthroid 50 mcg daily.  Check TSH with next blood draw.  Anxiety Continue Remeron 7.5 mg nightly.  Will place referral to psychiatry per wife request.  Depression, major, single episode, in partial remission (HCC) Continue Wellbutrin 300 mg daily and Remeron 7.5 mg daily.  Will place referral to psychiatry.  They are interested in possible [place] therapy.  Osteoarthritis of right hip Stable.  Continue management per orthopedics.  Chronic constipation Stable.  Continue Colace and MiraLAX as needed.  Prostate cancer [facility]) Continue management per urology.    Subjective:  HPI:  His stable, chronic medical conditions are outlined below:  # Hypothyroidism - On synthroid 16mcg daily and tolerating well  # Anxiety / Depression / Dementia - Follows with psychiatry - On wellbutrin 300mg  daily, remeron 7.5mg  daily - Wife is interested in coming off some of his medications - They would like to be referred to a new psychiatrist in the area and are interested in possible [place] therapy.  # Osteoarthritis s/p right hip arthroplasty - Follows with orthopedis - On glucosamine chondroiton  # Constipation - On bowel regimen of colace 100mg  daily and miralax as needed and doing well  % History of Skin Cancer -  Follows with dermatology but needs referral to one in the area.   % Prostate Cancer s/p prostatectomy and radiation / Erectile Dysfunction - Follows with urology - On viagra as needed  ROS: Per HPI, otherwise a complete review of systems was negative.   PMH:  The following were reviewed and entered/updated in epic: Past Medical History:  Diagnosis Date  .  Arthritis   . Atrial fibrillation ([place])   . Complication of anesthesia    Atrial fibrillation during surgery   . Dementia ([place])   . Depression   . Hard of hearing   . Hx of constipation   . Osteoporosis    DEXA 11/09/10. Completed treatemtn with Fosamax.  . Prostate cancer ([place])   . Restless legs   . Thyroid disease    hypothyroidism  . Vitiligo    Patient Active Problem List   Diagnosis Date Noted  . History of skin cancer 01/12/2019  . Erectile dysfunction 01/12/2019  . Anxiety 01/12/2019  . Depression, major, single episode, in partial remission ([place]) 01/12/2019  . Volvulus of sigmoid colon s/p flex sig/rectal tube 09/27/2017 09/27/2017  . Mild dementia ([place]) 09/27/2017  . Chronic constipation 09/27/2017  . Depression, major, single episode, severe ([place]) 12/14/2016  . Osteoarthritis of right hip 06/05/2016  . Prostate cancer ([place]) 12/07/2015  . Hypothyroidism 12/07/2015   Past Surgical History:  Procedure Laterality Date  . BACK SURGERY    . COLONOSCOPY    . FLEXIBLE SIGMOIDOSCOPY N/A 09/27/2017   Procedure: FLEXIBLE SIGMOIDOSCOPY;  Surgeon: Gatha Mayer, MD;  Location[facility] ENDOSCOPY;  Service: Endoscopy;  Laterality: N/A;  . JOINT REPLACEMENT    . LAMINECTOMY  10/2011   cervical and lumbar laminectomy  . PROSTATE BIOPSY  07/10/2011  . PROSTATE BIOPSY  09/22/15  . TOTAL HIP ARTHROPLASTY Right 02/29/2012  . TOTAL HIP ARTHROPLASTY Right 07/04/2016   Procedure: RIGHT TOTAL HIP ARTHROPLASTY ANTERIOR APPROACH;  Surgeon: Rod Can, MD;  Location: [place];  Service: Orthopedics;  Laterality: Right;  . TRANSURETHRAL RESECTION OF PROSTATE      Family History  Problem Relation Age of Onset  . Atrial fibrillation Mother   . Atrial fibrillation Sister   . Cancer Neg Hx     Medications- reviewed and updated Current Outpatient Medications  Medication Sig Dispense Refill  . buPROPion (WELLBUTRIN XL) 300 MG 24 hr tablet Take 300 mg by mouth daily.     . Cyanocobalamin  (VITAMIN B 12 PO) Take 1,000 Units by mouth once.    . docusate sodium (COLACE) 100 MG capsule Take 1 capsule (100 mg total) by mouth every 12 (twelve) hours. (Patient taking differently: Take 100 mg by mouth daily as needed. ) 60 capsule 0  . Glucosamine-Chondroitin-MSM 846-962-952 MG TABS Take by mouth.    . mirtazapine (REMERON) 7.5 MG tablet mirtazapine 7.5 mg tablet  Take 1 tablet every day by oral route at bedtime.    . Multiple Vitamins-Minerals (MULTIVITAMIN ADULT PO) Take 1 tablet by mouth daily. One a Day    . polyethylene glycol powder (MIRALAX) powder Miralax 17 gram/dose oral powder  Take by oral route as directed.    . sildenafil (VIAGRA) 50 MG tablet sildenafil 50 mg tablet    . SYNTHROID 50 MCG tablet TAKE 1 TABLET DAILY BEFORE BREAKFAST 30 tablet 0   No current facility-administered medications for this visit.     Allergies-reviewed and updated Allergies  Allergen Reactions  . Other     Social History   Socioeconomic History  . Marital status: Married    Spouse name: Not on file  . Number of children: Not on file  . Years of education: Not on file  . Highest education level: Not on file  Occupational History  . Not on file  Social Needs  . Financial resource strain: Not on file  . Food insecurity    Worry: Not on file    Inability: Not on file  . Transportation needs    Medical: Not on file    Non-medical: Not on file  Tobacco Use  . Smoking status: Former Smoker    Types: Cigarettes    Quit date: 05/28/1974    Years since quitting: 44.6  . Smokeless tobacco: Never Used  . Tobacco comment: smoked very little   Substance and Sexual Activity  . Alcohol use: Yes    Comment: 4-5 standard drinks per week/ stopped drinkin   . Drug use: No  . Sexual activity: Yes  Lifestyle  . Physical activity    Days per week: Not on file    Minutes per session: Not on file  . Stress: Not on file  Relationships  . Social Herbalist on phone: Not on file     Gets together: Not on file    Attends religious service: Not on file    Active member of club or organization: Not on file    Attends meetings of clubs or organizations: Not on file    Relationship status: Not on file  Other Topics Concern  . Not on file  Social History Narrative  . Not on file          Objective:  Physical Exam: BP 108/64   Pulse 64   Temp 98.2 F (36.8 C)   Ht 5\' 10"  (1.778 m)   Wt 168 lb 3.2 oz (76.3 kg)   SpO2 96%   BMI 24.13 kg/m   Gen: NAD, resting comfortably CV: Regular rate  and rhythm with no murmurs appreciated Pulm: Normal work of breathing, clear to auscultation bilaterally with no crackles, wheezes, or rhonchi GI: Normal bowel sounds present. Soft, Nontender, Nondistended. MSK: No edema, cyanosis, or clubbing noted Skin: Warm, dry Neuro: Grossly normal, moves all extremities Psych: Normal affect and thought content  Time Spent: I spent >40 minutes face-to-face with the patient, with more than half spent on counseling for management plan for his hypothyroidism, anxiety, depression, constipation, prostate cancer, and skin cancer.       Algis Greenhouse. Jerline Pain, MD 01/12/2019 2:47 PM

## 2019-01-12 NOTE — Assessment & Plan Note (Signed)
Continue Synthroid 50 mcg daily.  Check TSH with next blood draw. 

## 2019-01-12 NOTE — Assessment & Plan Note (Signed)
Continue management per urology. 

## 2019-01-12 NOTE — Addendum Note (Signed)
Addended by: Vivi Barrack on: 01/12/2019 02:48 PM   Modules accepted: Level of Service

## 2019-01-12 NOTE — Assessment & Plan Note (Signed)
Stable.  Continue Colace and MiraLAX as needed.

## 2019-01-12 NOTE — Assessment & Plan Note (Signed)
Referral placed to dermatology

## 2019-01-12 NOTE — Patient Instructions (Signed)
It was very nice to see you today!  I will place referral to ophthalmology, psychiatry, and dermatology.  Please come back in 6 months for blood work and your annual check up.  Take care, Dr Jerline Pain  Please try these tips to maintain a healthy lifestyle:   Eat at least 3 REAL meals and 1-2 snacks per day.  Aim for no more than 5 hours between eating.  If you eat breakfast, please do so within one hour of getting up.    Obtain twice as many fruits/vegetables as protein or carbohydrate foods for both lunch and dinner. (Half of each meal should be fruits/vegetables, one quarter protein, and one quarter starchy carbs)   Cut down on sweet beverages. This includes juice, soda, and sweet tea.    Exercise at least 150 minutes every week.

## 2019-01-12 NOTE — Assessment & Plan Note (Signed)
Stable.  Continue management per orthopedics. 

## 2019-01-12 NOTE — Assessment & Plan Note (Signed)
Continue Wellbutrin 300 mg daily and Remeron 7.5 mg daily.  Will place referral to psychiatry.  They are interested in possible [place] therapy.

## 2019-01-12 NOTE — Assessment & Plan Note (Signed)
Stable.  Continue Viagra as needed per urology.

## 2019-01-12 NOTE — Assessment & Plan Note (Signed)
Continue Remeron 7.5 mg nightly.  Will place referral to psychiatry per wife request.

## 2019-01-21 ENCOUNTER — Encounter: Payer: Self-pay | Admitting: Family Medicine

## 2019-01-30 ENCOUNTER — Ambulatory Visit: Payer: Medicare Other | Admitting: Podiatry

## 2019-02-04 ENCOUNTER — Telehealth: Payer: Self-pay | Admitting: Physical Therapy

## 2019-02-04 NOTE — Telephone Encounter (Signed)
Spoke with patient wife notified referrals were placed,voices understanding.

## 2019-02-04 NOTE — Telephone Encounter (Signed)
Copied from [place] [phone]. Topic: General - Other >> Feb 04, 2019  1:02 PM Mathis Bud wrote: Reason for CRM: Patient would like a call back from PCP nurse due a couple of referrals patients would like. Call back [phone]

## 2019-02-06 NOTE — Telephone Encounter (Signed)
[name] calling again and want to speak to someone concerning the appt and where was the referral sched at. Please call to advise

## 2019-02-09 NOTE — Telephone Encounter (Signed)
See note

## 2019-02-10 ENCOUNTER — Other Ambulatory Visit: Payer: Self-pay

## 2019-02-10 ENCOUNTER — Ambulatory Visit (INDEPENDENT_AMBULATORY_CARE_PROVIDER_SITE_OTHER): Payer: Medicare Other | Admitting: Podiatry

## 2019-02-10 ENCOUNTER — Encounter: Payer: Self-pay | Admitting: Podiatry

## 2019-02-10 DIAGNOSIS — M79674 Pain in right toe(s): Secondary | ICD-10-CM

## 2019-02-10 DIAGNOSIS — M79675 Pain in left toe(s): Secondary | ICD-10-CM | POA: Diagnosis not present

## 2019-02-10 DIAGNOSIS — B351 Tinea unguium: Secondary | ICD-10-CM | POA: Diagnosis not present

## 2019-02-10 NOTE — Telephone Encounter (Signed)
Patient called to inform us that her referrals has been scheduled.

## 2019-02-10 NOTE — Patient Instructions (Signed)

## 2019-02-15 NOTE — Progress Notes (Signed)
Subjective: Lillia Mountain is seen today for follow up painful, elongated, thickened toenails 1-5 b/l feet that he cannot cut. Pain interferes with daily activities. Aggravating factor includes wearing enclosed shoe gear and relieved with periodic debridement.  Wife is present during the visit. She states right great toe looks much better now.  Wife nor Mr. Jolly voice any new concerns on today's visit.  Objective:  Vascular Examination: Capillary refill time immediate x 10 digits.  Dorsalis pedis present b/l.  Posterior tibial pulses present b/l.  Digital hair absent x 10 digits.  Skin temperature gradient WNL b/l.   Dermatological Examination: Skin with mild atrophy b/l.  Toenails 1-5 left, 2-5 right discolored, thick, dystrophic with subungual debris and pain with palpation to nailbeds due to thickness of nails.  Right hallux nail plate noted to be growing back and nailbed remains intact. Nailplate has grown out proximal 2/3 of nailbed.   Musculoskeletal: Muscle strength 5/5 to all LE muscle groups  No gross bony deformities b/l.  No pain, crepitus or joint limitation noted with ROM.   Neurological Examination: Protective sensation intact with 10 gram monofilament bilaterally.  Epicritic sensation present bilaterally.  Vibratory sensation intact bilaterally.   Assessment: Painful onychomycosis toenails 1-5 b/l   Plan: 1. Toenails 1-5 b/l were debrided in length and girth without iatrogenic bleeding. 2. Patient to continue soft, supportive shoe gear 3. Patient to report any pedal injuries to medical professional immediately. 4. Follow up 9 weeks. 5. Patient/POA to call should there be a concern in the interim.

## 2019-02-16 DIAGNOSIS — D3132 Benign neoplasm of left choroid: Secondary | ICD-10-CM | POA: Diagnosis not present

## 2019-02-16 DIAGNOSIS — H524 Presbyopia: Secondary | ICD-10-CM | POA: Diagnosis not present

## 2019-02-16 DIAGNOSIS — H0102B Squamous blepharitis left eye, upper and lower eyelids: Secondary | ICD-10-CM | POA: Diagnosis not present

## 2019-02-16 DIAGNOSIS — H5203 Hypermetropia, bilateral: Secondary | ICD-10-CM | POA: Diagnosis not present

## 2019-02-16 DIAGNOSIS — H52203 Unspecified astigmatism, bilateral: Secondary | ICD-10-CM | POA: Diagnosis not present

## 2019-02-16 DIAGNOSIS — H0102A Squamous blepharitis right eye, upper and lower eyelids: Secondary | ICD-10-CM | POA: Diagnosis not present

## 2019-02-16 DIAGNOSIS — H11153 Pinguecula, bilateral: Secondary | ICD-10-CM | POA: Diagnosis not present

## 2019-02-16 DIAGNOSIS — H2513 Age-related nuclear cataract, bilateral: Secondary | ICD-10-CM | POA: Diagnosis not present

## 2019-02-17 ENCOUNTER — Ambulatory Visit (INDEPENDENT_AMBULATORY_CARE_PROVIDER_SITE_OTHER): Payer: Medicare Other | Admitting: Nurse Practitioner

## 2019-02-17 ENCOUNTER — Encounter: Payer: Self-pay | Admitting: Nurse Practitioner

## 2019-02-17 ENCOUNTER — Encounter

## 2019-02-17 VITALS — BP 90/58 | HR 68 | Temp 97.9°F | Ht 70.0 in | Wt 169.0 lb

## 2019-02-17 DIAGNOSIS — K5900 Constipation, unspecified: Secondary | ICD-10-CM

## 2019-02-17 DIAGNOSIS — R194 Change in bowel habit: Secondary | ICD-10-CM

## 2019-02-17 MED ORDER — NA SULFATE-K SULFATE-MG SULF 17.5-3.13-1.6 GM/177ML PO SOLN: ORAL | 0 refills | Status: DC

## 2019-02-17 NOTE — Patient Instructions (Addendum)
If you are age 79 or older, your body mass index should be between 23-30. Your Body mass index is 24.25 kg/m. If this is out of the aforementioned range listed, please consider follow up with your Primary Care Provider.  If you are age 36 or younger, your body mass index should be between 19-25. Your Body mass index is 24.25 kg/m. If this is out of the aformentioned range listed, please consider follow up with your Primary Care Provider.    You have been scheduled for a colonoscopy. Please follow written instructions given to you at your visit today.  Please pick up your prep supplies at the pharmacy within the next 1-3 days. If you use inhalers (even only as needed), please bring them with you on the day of your procedure. Your physician has requested that you go to www.startemmi.com and enter the access code given to you at your visit today. This web site gives a general overview about your procedure. However, you should still follow specific instructions given to you by our office regarding your preparation for the procedure.  We have sent the following medications to your pharmacy for you to pick up at your convenience: [employer] daily.  Start stool softener daily.  Thank you for choosing me and [facility].   [name], NP

## 2019-02-17 NOTE — Progress Notes (Signed)
Agree with assessment and plan, will proceed with colonoscopy.

## 2019-02-17 NOTE — Progress Notes (Signed)
Chief Complaint:    Bowel changes  IMPRESSION and PLAN:    78 year old male with chronic constipation and history of volvulus/pseudoobstruction requiring decompression on a couple of occasions in May 2019. Ultimately had sigmoid resection in [place]. Here now with complaints of small caliber stools over last few months.  -He could have an anastomotic stricture, barium enema is an option. His last full colonoscopy was been nearly 10 years ago so not unreasonable to pursue that route. I will schedule him for a colonoscopy but explained that Dr. Havery Moros may prefer barium enema in which case I will let him know.  -In the interim I recommended he take Miralax on a daily basis, currently only using prn.   HPI:     Patient is a 78 year old male with a history of chronic constipation.  He was hospitalized May last year with sigmoid volvulus underwent flexible sigmoidoscopy. There was question of some ischemic mucosal changes at the time.  He was evaluated by surgery who offered sigmoid resection/descending colostomy.  Patient declined and they went to [place] for a second opinion a few days later. In [place] he underwent repeat flexible sigmoidoscopy with findings of a pseudoobstruction with transition point about 20 cm from the anal verge.  Sigmoid was dilated upstream from there.  Subsequently underwent a laparoscopic sigmoidectomy.  Post-operatively course complicated by another pseudoobstruction requiring repeat flexible sigmoidoscopy with placement of rectal tube  Mr. Varland is here with his wife.  She is concerned about recurrent constipation.  In [place] patient was advised against the use of antidepressants given their potential for constipation.  Cymbalta was discontinued, Mirtazapine started .  Upon return to [facility] psychiatry started patient on Wellbutrin then increased the dose to 300 mg. Wife has decided to change to a different psychiatrist. She is interested in  alternatives to medications. His  Bowels moved well after surgery, stools were normal size but over last few months they have become narrow. She thinks his abdomen is a little distended. He has no abdominal pain. Otherwise feels okay.   Review of systems:     No chest pain, no SOB, no fevers, no urinary sx   Past Medical History:  Diagnosis Date  . Arthritis   . Atrial fibrillation ([place])   . Complication of anesthesia    Atrial fibrillation during surgery   . Dementia ([place])   . Depression   . Hard of hearing   . Hx of constipation   . Osteoporosis    DEXA 11/09/10. Completed treatemtn with Fosamax.  . Prostate cancer ([place])   . Restless legs   . Thyroid disease    hypothyroidism  . Vitiligo     Patient's surgical history, family medical history, social history, medications and allergies were all reviewed in Epic    Current Outpatient Medications  Medication Sig Dispense Refill  . buPROPion (WELLBUTRIN XL) 300 MG 24 hr tablet Take 300 mg by mouth daily.     . Cyanocobalamin (VITAMIN B 12 PO) Take 1,000 Units by mouth daily.     Marland Kitchen docusate sodium (COLACE) 100 MG capsule Take 1 capsule (100 mg total) by mouth every 12 (twelve) hours. (Patient taking differently: Take 100 mg by mouth daily as needed. ) 60 capsule 0  . Glucosamine-Chondroitin-MSM 750-400-375 MG TABS Take 1 tablet by mouth daily.     . mirtazapine (REMERON) 7.5 MG tablet mirtazapine 7.5 mg tablet  Take 1 tablet every day by oral route at  bedtime.    . Multiple Vitamins-Minerals (MULTIVITAMIN ADULT PO) Take 1 tablet by mouth daily. One a Day    . polyethylene glycol powder (MIRALAX) powder as needed.     . sildenafil (VIAGRA) 50 MG tablet Take 50 mg by mouth as needed.     Marland Kitchen SYNTHROID 50 MCG tablet TAKE 1 TABLET DAILY BEFORE BREAKFAST 30 tablet 0  . Na Sulfate-K Sulfate-Mg Sulf 17.5-3.13-1.6 GM/177ML SOLN Suprep-Use as directed 354 mL 0   No current facility-administered medications for this visit.     Physical  Exam:     BP (!) 90/58   Pulse 68   Temp 97.9 F (36.6 C)   Ht 5\' 10"  (1.778 m)   Wt 169 lb (76.7 kg)   BMI 24.25 kg/m   GENERAL:  Pleasant male in NAD PSYCH: : Cooperative, normal affect EENT:  conjunctiva pink, mucous membranes moist, neck supple without masses CARDIAC:  RRR,  no peripheral edema PULM: Normal respiratory effort ABDOMEN:  Nondistended, soft, nontender. Normal bowel sounds SKIN:  turgor, no lesions seen Musculoskeletal:  Normal muscle tone, normal strength NEURO: Alert and oriented x 3, no focal neurologic deficits   [name] , NP 02/17/2019, 5:50 PM

## 2019-02-18 ENCOUNTER — Other Ambulatory Visit: Payer: Self-pay

## 2019-02-18 ENCOUNTER — Ambulatory Visit (HOSPITAL_COMMUNITY): Payer: Medicare Other | Admitting: Psychiatry

## 2019-02-19 ENCOUNTER — Telehealth: Payer: Self-pay | Admitting: Physical Therapy

## 2019-02-19 NOTE — Telephone Encounter (Signed)
Copied from [place] [id number]. Topic: Referral - Question >> Feb 19, 2019 12:18 PM Sheran Luz wrote: Patient requesting call back from [facility] regarding physiatric referral.

## 2019-02-20 NOTE — Telephone Encounter (Signed)
Pts wife called and is requesting to have a call back. Pts wife is upset because she states she has been calling since Wednesday with no response. She states if she cannot speak with [facility] [place] she would like to speak with PCP nurse. She states this is very important. Please advise.

## 2019-02-20 NOTE — Telephone Encounter (Signed)
Left a message on both numbers.  I did explain on the voicemail that I have not been in my office, and that I will have to call them back if I am with a patient when they return my call.

## 2019-02-20 NOTE — Telephone Encounter (Signed)
Routing to [employer]	[place] and to [place].  Please call pt's wife w/ update.

## 2019-02-23 ENCOUNTER — Other Ambulatory Visit: Payer: Self-pay

## 2019-02-23 DIAGNOSIS — F322 Major depressive disorder, single episode, severe without psychotic features: Secondary | ICD-10-CM

## 2019-02-24 ENCOUNTER — Telehealth: Payer: Self-pay | Admitting: Physical Therapy

## 2019-02-24 NOTE — Telephone Encounter (Signed)
Copied from [place] [phone]. Topic: General - Other >> Feb 24, 2019 11:10 AM Rainey Pines A wrote: Patients wife will like a callback from [place] today.

## 2019-03-03 ENCOUNTER — Telehealth: Payer: Self-pay

## 2019-03-03 DIAGNOSIS — D1801 Hemangioma of skin and subcutaneous tissue: Secondary | ICD-10-CM | POA: Diagnosis not present

## 2019-03-03 DIAGNOSIS — Z85828 Personal history of other malignant neoplasm of skin: Secondary | ICD-10-CM | POA: Diagnosis not present

## 2019-03-03 DIAGNOSIS — L570 Actinic keratosis: Secondary | ICD-10-CM | POA: Diagnosis not present

## 2019-03-03 DIAGNOSIS — D2371 Other benign neoplasm of skin of right lower limb, including hip: Secondary | ICD-10-CM | POA: Diagnosis not present

## 2019-03-03 DIAGNOSIS — L80 Vitiligo: Secondary | ICD-10-CM | POA: Diagnosis not present

## 2019-03-03 DIAGNOSIS — L820 Inflamed seborrheic keratosis: Secondary | ICD-10-CM | POA: Diagnosis not present

## 2019-03-03 DIAGNOSIS — D692 Other nonthrombocytopenic purpura: Secondary | ICD-10-CM | POA: Diagnosis not present

## 2019-03-03 DIAGNOSIS — L821 Other seborrheic keratosis: Secondary | ICD-10-CM | POA: Diagnosis not present

## 2019-03-03 NOTE — Telephone Encounter (Signed)
Covid-19 screening questions   Do you now or have you had a fever in the last 14 days? NO   Do you have any respiratory symptoms of shortness of breath or cough now or in the last 14 days? NO  Do you have any family members or close contacts with diagnosed or suspected Covid-19 in the past 14 days? NO  Have you been tested for Covid-19 and found to be positive? NO        

## 2019-03-04 ENCOUNTER — Ambulatory Visit (AMBULATORY_SURGERY_CENTER): Payer: Medicare Other | Admitting: Gastroenterology

## 2019-03-04 ENCOUNTER — Other Ambulatory Visit: Payer: Self-pay

## 2019-03-04 ENCOUNTER — Telehealth: Payer: Self-pay | Admitting: Gastroenterology

## 2019-03-04 ENCOUNTER — Encounter: Payer: Self-pay | Admitting: Gastroenterology

## 2019-03-04 ENCOUNTER — Other Ambulatory Visit: Payer: Self-pay | Admitting: Gastroenterology

## 2019-03-04 VITALS — BP 109/72 | HR 56 | Temp 98.1°F | Resp 16 | Ht 70.0 in | Wt 169.0 lb

## 2019-03-04 DIAGNOSIS — R194 Change in bowel habit: Secondary | ICD-10-CM | POA: Diagnosis not present

## 2019-03-04 DIAGNOSIS — K635 Polyp of colon: Secondary | ICD-10-CM | POA: Diagnosis not present

## 2019-03-04 DIAGNOSIS — Z538 Procedure and treatment not carried out for other reasons: Secondary | ICD-10-CM | POA: Diagnosis not present

## 2019-03-04 MED ORDER — SODIUM CHLORIDE 0.9 % IV SOLN: 500.0000 mL | Freq: Once | INTRAVENOUS | Status: DC

## 2019-03-04 NOTE — Progress Notes (Signed)
Called to room to assist during endoscopic procedure.  Patient ID and intended procedure confirmed with present staff. Received instructions for my participation in the procedure from the performing physician.  

## 2019-03-04 NOTE — Op Note (Signed)
[facility] Patient Name: Jesse [name] Procedure Date: 03/04/2019 11:22 AM MRN: [id number] Endoscopist: Remo Lipps P. Havery Moros , MD Age: 78 Referring MD:  Date of Birth: [birth year] Gender: Male Account #: [id number] Procedure:                Colonoscopy Indications:              Change in bowel habits, history of sigmoid vovlulus                            s/p resection in May 2019 Medicines:                Monitored Anesthesia Care Procedure:                Pre-Anesthesia Assessment:                           - Prior to the procedure, a History and Physical                            was performed, and patient medications and                            allergies were reviewed. The patient's tolerance of                            previous anesthesia was also reviewed. The risks                            and benefits of the procedure and the sedation                            options and risks were discussed with the patient.                            All questions were answered, and informed consent                            was obtained. Prior Anticoagulants: The patient has                            taken no previous anticoagulant or antiplatelet                            agents. ASA Grade Assessment: III - A patient with                            severe systemic disease. After reviewing the risks                            and benefits, the patient was deemed in                            satisfactory condition to undergo the procedure.  After obtaining informed consent, the colonoscope                            was passed under direct vision. Throughout the                            procedure, the patient's blood pressure, pulse, and                            oxygen saturations were monitored continuously. The                            Colonoscope was introduced through the anus with                            the intention of advancing  to the cecum. The scope                            was advanced to the rectum before the procedure was                            aborted. Medications were given. The colonoscopy                            was performed without difficulty. The patient                            tolerated the procedure well. The quality of the                            bowel preparation was poor. Scope In: 11:26:24 AM Scope Out: 11:31:46 AM Total Procedure Duration: 0 hours 5 minutes 22 seconds  Findings:                 The perianal and digital rectal examinations were                            normal.                           A suspected benign-appearing, intrinsic severe                            stenosis measuring 2 mm or so (inner diameter) was                            found in the recto-sigmoid colon and was                            non-traversed. I could not see through it but could                            pass a biopsy forceps. It seems short. Biopsies  were taken with a cold forceps for histology. No                            other obvious lumen was noted otherwise however the                            prep was poor and precluded good visualization.                           A moderate amount of semi-solid stool was found in                            the rectum, precluding visualization most of the                            rectum, the procedure was terminated. Complications:            No immediate complications. Estimated blood loss:                            Minimal. Estimated Blood Loss:     Estimated blood loss was minimal. Impression:               - Preparation of the colon was poor.                           - A suspected anastomotic stricture in the                            recto-sigmoid colon. Biopsied. Not able to be                            traversed. Suspect this is an anastomotic                            stricture, unclear length,  suspect short. Did not                            dilate today without further workup first. Recommendation:           - Patient has a contact number available for                            emergencies. The signs and symptoms of potential                            delayed complications were discussed with the                            patient. Return to normal activities tomorrow.                            Written discharge instructions were provided to the  patient.                           - Resume previous diet.                           - Continue present medications.                           - Recommend double dose Miralax twice daily and                            titrate up as needed to keep stools loose and                            prevent obstruction                           - Await pathology results.                           - CT scan abdomen / pelvis to further evaluate,                            will discuss with patient. Hopefully pending                            results this may be amenable to balloon dilation                            for therapy. Remo Lipps P. Armbruster, MD 03/04/2019 11:39:10 AM This report has been signed electronically.

## 2019-03-04 NOTE — Progress Notes (Signed)
Bottles of contrast and instructions given to patient and his wife.  Advised office will be calling to set up CT scan asap.

## 2019-03-04 NOTE — Patient Instructions (Addendum)
Recommend double does of Miralax twice daily and adjust to keep stools liquid to prevent an obstruction.  Recommend CT scan as soon as it can be scheduled.  Our office will call you to set this up. FIll out instruction sheet that is in bag with the bottles of contrast with the information that the scheduler provides.  Resume previous diet and medications today.  Return to your normal activities tomorrow.        YOU HAD AN ENDOSCOPIC PROCEDURE TODAY AT [facility]:   Refer to the procedure report that was given to you for any specific questions about what was found during the examination.  If the procedure report does not answer your questions, please call your gastroenterologist to clarify.  If you requested that your care partner not be given the details of your procedure findings, then the procedure report has been included in a sealed envelope for you to review at your convenience later.  YOU SHOULD EXPECT: Some feelings of bloating in the abdomen. Passage of more gas than usual.  Walking can help get rid of the air that was put into your GI tract during the procedure and reduce the bloating. If you had a lower endoscopy (such as a colonoscopy or flexible sigmoidoscopy) you may notice spotting of blood in your stool or on the toilet paper. If you underwent a bowel prep for your procedure, you may not have a normal bowel movement for a few days.  Please Note:  You might notice some irritation and congestion in your nose or some drainage.  This is from the oxygen used during your procedure.  There is no need for concern and it should clear up in a day or so.  SYMPTOMS TO REPORT IMMEDIATELY:   Following lower endoscopy (colonoscopy or flexible sigmoidoscopy):  Excessive amounts of blood in the stool  Significant tenderness or worsening of abdominal pains  Swelling of the abdomen that is new, acute  Fever of 100F or higher  For urgent or emergent issues, a  gastroenterologist can be reached at any hour by calling [phone].   DIET:  We do recommend a small meal at first, but then you may proceed to your regular diet.  Drink plenty of fluids but you should avoid alcoholic beverages for 24 hours.  ACTIVITY:  You should plan to take it easy for the rest of today and you should NOT DRIVE or use heavy machinery until tomorrow (because of the sedation medicines used during the test).    FOLLOW UP: Our staff will call the number listed on your records 48-72 hours following your procedure to check on you and address any questions or concerns that you may have regarding the information given to you following your procedure. If we do not reach you, we will leave a message.  We will attempt to reach you two times.  During this call, we will ask if you have developed any symptoms of COVID 19. If you develop any symptoms (ie: fever, flu-like symptoms, shortness of breath, cough etc.) before then, please call [phone].  If you test positive for Covid 19 in the 2 weeks post procedure, please call and report this information to [place].    If any biopsies were taken you will be contacted by phone or by letter within the next 1-3 weeks.  Please call us at [phone] if you have not heard about the biopsies in 3 weeks.    SIGNATURES/CONFIDENTIALITY: You and/or your care partner  have signed paperwork which will be entered into your electronic medical record.  These signatures attest to the fact that that the information above on your After Visit Summary has been reviewed and is understood.  Full responsibility of the confidentiality of this discharge information lies with you and/or your care-partner.

## 2019-03-04 NOTE — Progress Notes (Signed)
Temp taken by KA VS taken by CW 

## 2019-03-04 NOTE — Progress Notes (Signed)
To PACU, VSS. Report to [email or web address] 

## 2019-03-05 ENCOUNTER — Other Ambulatory Visit: Payer: Self-pay

## 2019-03-05 ENCOUNTER — Telehealth: Payer: Self-pay

## 2019-03-05 ENCOUNTER — Encounter: Payer: Self-pay | Admitting: Family Medicine

## 2019-03-05 ENCOUNTER — Ambulatory Visit (INDEPENDENT_AMBULATORY_CARE_PROVIDER_SITE_OTHER): Payer: Medicare Other | Admitting: Family Medicine

## 2019-03-05 VITALS — BP 118/70 | HR 86 | Temp 98.2°F | Ht 70.0 in | Wt 172.0 lb

## 2019-03-05 DIAGNOSIS — C61 Malignant neoplasm of prostate: Secondary | ICD-10-CM | POA: Diagnosis not present

## 2019-03-05 DIAGNOSIS — K56699 Other intestinal obstruction unspecified as to partial versus complete obstruction: Secondary | ICD-10-CM

## 2019-03-05 DIAGNOSIS — N481 Balanitis: Secondary | ICD-10-CM | POA: Diagnosis not present

## 2019-03-05 DIAGNOSIS — K5909 Other constipation: Secondary | ICD-10-CM

## 2019-03-05 DIAGNOSIS — F322 Major depressive disorder, single episode, severe without psychotic features: Secondary | ICD-10-CM | POA: Diagnosis not present

## 2019-03-05 DIAGNOSIS — R338 Other retention of urine: Secondary | ICD-10-CM | POA: Diagnosis not present

## 2019-03-05 NOTE — Telephone Encounter (Signed)
-----   Message from Yetta Flock, MD sent at 03/04/2019  1:54 PM EDT ----- Regarding: CT scan Hi Shomari Matusik, Can you please coordinate a CT abdomen / pelvis with contrast for this patient ASAP for colon stricture. I saw him today for colonoscopy but wife is POC to coordinate. Thanks much

## 2019-03-05 NOTE — Telephone Encounter (Signed)
CT order is in  Epic, left message on wife's voice mail to please call me back (to schedule)

## 2019-03-05 NOTE — Telephone Encounter (Signed)
Thanks for letting me know. I called the patient / wife's cell phone and left a message about my concerns. I had communicated the plan with them after his procedure recently, not sure why they are leaving town when we can take care of it here, will call again to discuss

## 2019-03-05 NOTE — Telephone Encounter (Signed)
Left message on wife's phone to please call back (trying to schedule CT)

## 2019-03-05 NOTE — Telephone Encounter (Signed)
Wife just called back, and says she and her husband are flying back to [place] today and they are going to follow-up with their G.I. Dr. Enos Fling there

## 2019-03-05 NOTE — Progress Notes (Signed)
   Chief Complaint:  Jesse [name] is a 78 y.o. male who presents today with a chief complaint of constipation.   Assessment/Plan:  Constipation / Colonic Stricture Patient has significant degree of distention on exam.  He is very uncomfortable but has no signs of peritonitis.  Had lengthy discussion with patient's wife regarding his treatment plan.  They are planning on taking him to [place] to his previous gastroenterologist and possibly admitting him to the hospital in [place].  I advised him that he should have his CT scan done as soon as possible as well as ordered by his gastroenterologist.  I told him that the GI office had called him this morning and have left a message on her phone.  Patient's wife stated that she would call the office as soon as we finished her visit today.  Will place order for rectal tube and leg bag per patient's wife request.  He apparently had use this over last couple days and has some improvement in his distention.  Discussed strict reasons to seek emergent care.  Depression, major, single episode, severe ([place]) Will wean off wellbutrin due to concern over causing worsening constipation. Will continue remeron 7.5mg  nightly.     Subjective:  HPI:  Constipation Patient was scheduled to have colonoscopy yesterday.  During the procedure he was found to have a lower colon stricture and the procedure was aborted.  Patient has had ongoing constipation for several months.  He is also had some increasing abdominal distention over the last several days.  His gastroenterologist recommended that he get a CT scan to further evaluate the stricture.  No attempt was made a dilation during his CT colonoscopy yesterday.  His wife placed a rectal tube yesterday with some relief of his abdominal distention.  He has not had any appetite.  No fevers or chills.  Patient is also currently on Wellbutrin 300 mg daily and Remeron 7.5 mg nightly for anxiety and depression.  He has seen  multiple psychiatrist for this in the past.  Reportedly his most recent psychiatrist told him that he should not stop the Wellbutrin.  Patient and his wife are concerned that his medications could be worsening his constipation.  They would like to wean off the Wellbutrin.  ROS: Per HPI  PMH: He reports that he quit smoking about 44 years ago. His smoking use included cigarettes. He has never used smokeless tobacco. He reports current alcohol use. He reports that he does not use drugs.      Objective:  Physical Exam: BP 118/70 (BP Location: Right Arm, Patient Position: Sitting, Cuff Size: Normal)   Pulse 86   Temp 98.2 F (36.8 C)   Ht 5\' 10"  (1.778 m)   Wt 172 lb (78 kg)   SpO2 96%   BMI 24.68 kg/m   Gen: NAD, resting comfortably CV: Regular rate and rhythm with no murmurs appreciated Pulm: Normal work of breathing, clear to auscultation bilaterally with no crackles, wheezes, or rhonchi GI: Distended.  No tenderness or rebound.  Normal bowel sounds. MSK: No edema, cyanosis, or clubbing noted Skin: Warm, dry Neuro: Grossly normal, moves all extremities Psych: Normal affect and thought content  Time Spent: I spent 45 minutes face-to-face with the patient, with more than half spent on counseling for management plan for his colonic stricture, constipation, and depression.      Algis Greenhouse. Jerline Pain, MD 03/05/2019 3:56 PM

## 2019-03-05 NOTE — Telephone Encounter (Signed)
-----   Message from Yetta Flock, MD sent at 03/04/2019  1:54 PM EDT ----- Regarding: CT scan Hi Caylen Kuwahara, Can you please coordinate a CT abdomen / pelvis with contrast for this patient ASAP for colon stricture. I saw him today for colonoscopy but wife is POC to coordinate. Thanks much

## 2019-03-05 NOTE — Patient Instructions (Signed)
It was very nice to see you today!  Please cut down on the wellbutrin.  Please call Dr Doyne Keel office soon.   Take care, Dr Jerline Pain  Please try these tips to maintain a healthy lifestyle:   Eat at least 3 REAL meals and 1-2 snacks per day.  Aim for no more than 5 hours between eating.  If you eat breakfast, please do so within one hour of getting up.    Obtain twice as many fruits/vegetables as protein or carbohydrate foods for both lunch and dinner. (Half of each meal should be fruits/vegetables, one quarter protein, and one quarter starchy carbs)   Cut down on sweet beverages. This includes juice, soda, and sweet tea.    Exercise at least 150 minutes every week.

## 2019-03-05 NOTE — Assessment & Plan Note (Signed)
Will wean off wellbutrin due to concern over causing worsening constipation. Will continue remeron 7.5mg  nightly.

## 2019-03-06 ENCOUNTER — Telehealth: Payer: Self-pay

## 2019-03-06 DIAGNOSIS — K624 Stenosis of anus and rectum: Secondary | ICD-10-CM | POA: Diagnosis not present

## 2019-03-06 DIAGNOSIS — K56609 Unspecified intestinal obstruction, unspecified as to partial versus complete obstruction: Secondary | ICD-10-CM | POA: Diagnosis not present

## 2019-03-06 DIAGNOSIS — I700 Atherosclerosis of aorta: Secondary | ICD-10-CM | POA: Diagnosis not present

## 2019-03-06 DIAGNOSIS — Z8546 Personal history of malignant neoplasm of prostate: Secondary | ICD-10-CM | POA: Diagnosis not present

## 2019-03-06 DIAGNOSIS — K56699 Other intestinal obstruction unspecified as to partial versus complete obstruction: Secondary | ICD-10-CM | POA: Diagnosis not present

## 2019-03-06 DIAGNOSIS — I4891 Unspecified atrial fibrillation: Secondary | ICD-10-CM | POA: Diagnosis not present

## 2019-03-06 DIAGNOSIS — K913 Postprocedural intestinal obstruction, unspecified as to partial versus complete: Secondary | ICD-10-CM | POA: Diagnosis not present

## 2019-03-06 DIAGNOSIS — K6389 Other specified diseases of intestine: Secondary | ICD-10-CM | POA: Diagnosis not present

## 2019-03-06 DIAGNOSIS — R9431 Abnormal electrocardiogram [ECG] [EKG]: Secondary | ICD-10-CM | POA: Diagnosis not present

## 2019-03-06 DIAGNOSIS — F419 Anxiety disorder, unspecified: Secondary | ICD-10-CM | POA: Diagnosis not present

## 2019-03-06 DIAGNOSIS — R109 Unspecified abdominal pain: Secondary | ICD-10-CM | POA: Diagnosis not present

## 2019-03-06 DIAGNOSIS — Z86010 Personal history of colonic polyps: Secondary | ICD-10-CM | POA: Diagnosis not present

## 2019-03-06 NOTE — Telephone Encounter (Signed)
Left message on answering machine. 

## 2019-03-06 NOTE — Telephone Encounter (Signed)
Called [phone] and left a messaged we tried to reach pt for a follow up call. maw

## 2019-03-06 NOTE — Telephone Encounter (Signed)
Called and got ahold of the patient's wife, asked her what was going on and why they left town to seek care elsewhere given his acute needs and to ensure he was receiving care. We spoke for 15 minutes or so. She explained the patient's prior hospitalization at [facility] last year for his volvulus went poorly, she did not have a good relationship with the surgeons here and left AMA for a second opinion in [place] at a hospital where she worked, and her husband had his surgery there. She feels she has had issues with care and trust in her prior providers in [place] and has a good relationship in [place] at a hospital where she worked and she wishes for him to receive care and near to her family. He apparently developed urinary retention and got admitted for that there and wishes to stay in [place] to receive his care for the colonic stricture. They can follow up with [place] as needed when he gets back in town if they wish.

## 2019-03-07 DIAGNOSIS — Z86010 Personal history of colonic polyps: Secondary | ICD-10-CM | POA: Diagnosis not present

## 2019-03-07 DIAGNOSIS — K5939 Other megacolon: Secondary | ICD-10-CM | POA: Diagnosis not present

## 2019-03-07 DIAGNOSIS — K56699 Other intestinal obstruction unspecified as to partial versus complete obstruction: Secondary | ICD-10-CM | POA: Diagnosis not present

## 2019-03-07 DIAGNOSIS — K5909 Other constipation: Secondary | ICD-10-CM | POA: Diagnosis present

## 2019-03-07 DIAGNOSIS — Z96643 Presence of artificial hip joint, bilateral: Secondary | ICD-10-CM | POA: Diagnosis present

## 2019-03-07 DIAGNOSIS — Z20828 Contact with and (suspected) exposure to other viral communicable diseases: Secondary | ICD-10-CM | POA: Diagnosis present

## 2019-03-07 DIAGNOSIS — I4891 Unspecified atrial fibrillation: Secondary | ICD-10-CM | POA: Diagnosis present

## 2019-03-07 DIAGNOSIS — R310 Gross hematuria: Secondary | ICD-10-CM | POA: Diagnosis present

## 2019-03-07 DIAGNOSIS — Z9689 Presence of other specified functional implants: Secondary | ICD-10-CM | POA: Diagnosis not present

## 2019-03-07 DIAGNOSIS — F419 Anxiety disorder, unspecified: Secondary | ICD-10-CM | POA: Diagnosis present

## 2019-03-07 DIAGNOSIS — C61 Malignant neoplasm of prostate: Secondary | ICD-10-CM | POA: Diagnosis not present

## 2019-03-07 DIAGNOSIS — K56609 Unspecified intestinal obstruction, unspecified as to partial versus complete obstruction: Secondary | ICD-10-CM | POA: Diagnosis not present

## 2019-03-07 DIAGNOSIS — K9409 Other complications of colostomy: Secondary | ICD-10-CM | POA: Diagnosis not present

## 2019-03-07 DIAGNOSIS — M1990 Unspecified osteoarthritis, unspecified site: Secondary | ICD-10-CM | POA: Diagnosis present

## 2019-03-07 DIAGNOSIS — N481 Balanitis: Secondary | ICD-10-CM | POA: Diagnosis present

## 2019-03-07 DIAGNOSIS — Z8546 Personal history of malignant neoplasm of prostate: Secondary | ICD-10-CM | POA: Diagnosis not present

## 2019-03-07 DIAGNOSIS — K6389 Other specified diseases of intestine: Secondary | ICD-10-CM | POA: Diagnosis not present

## 2019-03-07 DIAGNOSIS — K913 Postprocedural intestinal obstruction, unspecified as to partial versus complete: Secondary | ICD-10-CM | POA: Diagnosis present

## 2019-03-07 DIAGNOSIS — E039 Hypothyroidism, unspecified: Secondary | ICD-10-CM | POA: Diagnosis present

## 2019-03-07 DIAGNOSIS — F329 Major depressive disorder, single episode, unspecified: Secondary | ICD-10-CM | POA: Diagnosis present

## 2019-03-07 DIAGNOSIS — R339 Retention of urine, unspecified: Secondary | ICD-10-CM | POA: Diagnosis present

## 2019-03-07 DIAGNOSIS — K624 Stenosis of anus and rectum: Secondary | ICD-10-CM | POA: Diagnosis present

## 2019-03-07 DIAGNOSIS — Z9049 Acquired absence of other specified parts of digestive tract: Secondary | ICD-10-CM | POA: Diagnosis not present

## 2019-03-09 ENCOUNTER — Telehealth: Payer: Self-pay

## 2019-03-09 ENCOUNTER — Telehealth: Payer: Self-pay | Admitting: Gastroenterology

## 2019-03-09 NOTE — Telephone Encounter (Signed)
Called wife and let her Dr Havery Moros said the results are not back, it will hopefully be sometime this week and we will notify her as soon as possible

## 2019-03-09 NOTE — Telephone Encounter (Signed)
ERROR

## 2019-03-09 NOTE — Telephone Encounter (Signed)
Patient's wife called again and wanted to know the biopsy results from her husband's colonoscopy on 03/04/19. I let her know I did sedt her message to Dr. Havery Moros, but he was doing hospital procedures this morning and should get the message this afternoon. She said she would like the path results faxed to Dr. Eduardo Osier [phone] when finalized. I will do this once finalized and released

## 2019-03-09 NOTE — Telephone Encounter (Signed)
Can you please let her know that the biopsy results are not back yet, and once they are she will be contacted. Hopefully in the next few days. Thanks

## 2019-03-09 NOTE — Telephone Encounter (Signed)
Thanks Sherlynn Stalls please let her know it won't be back today, hopefully sometime this week and she will be notified when it is back. Thanks

## 2019-04-03 ENCOUNTER — Encounter

## 2019-04-03 DIAGNOSIS — K56609 Unspecified intestinal obstruction, unspecified as to partial versus complete obstruction: Secondary | ICD-10-CM | POA: Diagnosis not present

## 2019-04-03 DIAGNOSIS — Z20828 Contact with and (suspected) exposure to other viral communicable diseases: Secondary | ICD-10-CM | POA: Diagnosis not present

## 2019-04-06 ENCOUNTER — Ambulatory Visit: Payer: Medicare Other | Admitting: Podiatry

## 2019-04-07 DIAGNOSIS — Z980 Intestinal bypass and anastomosis status: Secondary | ICD-10-CM | POA: Diagnosis not present

## 2019-04-07 DIAGNOSIS — I480 Paroxysmal atrial fibrillation: Secondary | ICD-10-CM | POA: Diagnosis not present

## 2019-04-07 DIAGNOSIS — Z8546 Personal history of malignant neoplasm of prostate: Secondary | ICD-10-CM | POA: Diagnosis not present

## 2019-04-07 DIAGNOSIS — K56609 Unspecified intestinal obstruction, unspecified as to partial versus complete obstruction: Secondary | ICD-10-CM | POA: Diagnosis not present

## 2019-04-07 DIAGNOSIS — K648 Other hemorrhoids: Secondary | ICD-10-CM | POA: Diagnosis not present

## 2019-04-07 DIAGNOSIS — K649 Unspecified hemorrhoids: Secondary | ICD-10-CM | POA: Diagnosis not present

## 2019-04-07 DIAGNOSIS — E039 Hypothyroidism, unspecified: Secondary | ICD-10-CM | POA: Diagnosis not present

## 2019-04-07 DIAGNOSIS — Z86010 Personal history of colonic polyps: Secondary | ICD-10-CM | POA: Diagnosis not present

## 2019-04-07 DIAGNOSIS — K625 Hemorrhage of anus and rectum: Secondary | ICD-10-CM | POA: Diagnosis not present

## 2019-04-07 DIAGNOSIS — T184XXA Foreign body in colon, initial encounter: Secondary | ICD-10-CM | POA: Diagnosis not present

## 2019-04-17 ENCOUNTER — Ambulatory Visit (INDEPENDENT_AMBULATORY_CARE_PROVIDER_SITE_OTHER): Payer: Medicare Other | Admitting: Podiatry

## 2019-04-17 ENCOUNTER — Encounter: Payer: Self-pay | Admitting: Podiatry

## 2019-04-17 ENCOUNTER — Other Ambulatory Visit: Payer: Self-pay

## 2019-04-17 DIAGNOSIS — M79675 Pain in left toe(s): Secondary | ICD-10-CM

## 2019-04-17 DIAGNOSIS — M79674 Pain in right toe(s): Secondary | ICD-10-CM

## 2019-04-17 DIAGNOSIS — L601 Onycholysis: Secondary | ICD-10-CM

## 2019-04-17 DIAGNOSIS — B351 Tinea unguium: Secondary | ICD-10-CM

## 2019-04-23 ENCOUNTER — Encounter: Payer: Self-pay | Admitting: Family Medicine

## 2019-04-25 NOTE — Progress Notes (Signed)
Subjective: Jesse [name] is seen today for follow up painful, elongated, thickened toenails bilateral feet that he cannot cut. Pain interferes with daily activities. Aggravating factor includes wearing enclosed shoe gear and relieved with periodic debridement.  His wife is present during the visit and notes left great toe toenail appears to be loose. She does not recall any episodes of trauma to feet since last visit.   Medications reviewed in chart.  Allergies  Allergen Reactions  . Other     Objective:  Vascular Examination: Capillary refill time immediate x 10 digits.  Dorsalis pedis and Posterior tibial pulses present b/l.  Digital hair absent b/l.   Skin temperature gradient WNL b/l.  Dermatological Examination: Skin thin, shiny and atrophic b/l.  Toenails 1-5 b/l discolored, thick, dystrophic with subungual debris and pain with palpation to nailbeds due to thickness of nails.  Right hallux nail has grown out.   There is noted onchyolysis of entire nailplate of the left hallux.  The nailbed remains intact. There is no erythema, no edema, no drainage, no underlying flocculence.  Musculoskeletal: Muscle strength 5/5 to all LE muscle groups b/l.  No gross bony deformities b/l.  No pain, crepitus or joint limitation noted with ROM.   Neurological Examination: Protective sensation intact with 10 gram monofilament bilaterally.  Assessment: Painful onychomycosis toenails 1-5 b/l  Onycholysis left hallux  Plan: 1. Toenails 1-5 b/l were debrided in length and girth without iatrogenic bleeding. Left hallux nailplate gently debrided from it's remaining attachment to digit. Nailbed cleansed with alcohol. Triple antibiotic ointment applied. Wife instructed to apply antibiotic ointment to nailbed once daily for one week. 2. Patient to continue soft, supportive shoe gear daily. 3. Patient to report any pedal injuries to medical professional immediately. 4. Follow up 9  weeks. 5. Patient/POA to call should there be a concern in the interim.

## 2019-05-10 ENCOUNTER — Encounter: Payer: Self-pay | Admitting: Family Medicine

## 2019-06-24 ENCOUNTER — Ambulatory Visit (INDEPENDENT_AMBULATORY_CARE_PROVIDER_SITE_OTHER): Payer: Medicare Other | Admitting: Podiatry

## 2019-06-24 ENCOUNTER — Other Ambulatory Visit: Payer: Self-pay

## 2019-06-24 ENCOUNTER — Encounter: Payer: Self-pay | Admitting: Podiatry

## 2019-06-24 DIAGNOSIS — M79674 Pain in right toe(s): Secondary | ICD-10-CM | POA: Diagnosis not present

## 2019-06-24 DIAGNOSIS — M79675 Pain in left toe(s): Secondary | ICD-10-CM | POA: Diagnosis not present

## 2019-06-24 DIAGNOSIS — B351 Tinea unguium: Secondary | ICD-10-CM

## 2019-06-24 NOTE — Progress Notes (Signed)
Subjective: Jesse [name] presents today for follow up of follow up of painful mycotic nails b/l that are difficult to trim. Pain interferes with ambulation. Aggravating factors include wearing enclosed shoe gear. Pain is relieved with periodic professional debridement.   Allergies  Allergen Reactions  . Other     Wife is asking if she can use Tinactin Spray on his feet for prevention.  Objective: There were no vitals filed for this visit.  Vascular Examination:  capillary refill time to digits immediate b/l, palpable DP pulses b/l, palpable PT pulses b/l, pedal hair absent b/l and skin temperature gradient within normal limits b/l  Dermatological Examination: Pedal skin with normal turgor, texture and tone bilaterally, no open wounds bilaterally, no interdigital macerations bilaterally and toenails 1-5 right, 2-5 left elongated, dystrophic, thickened, crumbly with subungual debris. Left great toe nail plate growing out with adequate length on today.  Musculoskeletal: normal muscle strength 5/5 to all lower extremity muscle groups bilaterally, no gross bony deformities bilaterally and no pain crepitus or joint limitation noted with ROM b/l  Neurological: sensation intact 5/5 intact bilaterally with 10g monofilament b/l and vibratory sensation intact b/l  Assessment: 1. Pain due to onychomycosis of toenails of both feet     Plan: -Toenails 1-5 right, 2-5 left were debrided in length and girth without iatrogenic bleeding. -Wife may apply Tinactin Spray to feet once daily to prevent spread.  -Patient to continue soft, supportive shoe gear daily. -Patient to report any pedal injuries to medical professional immediately. -Patient/POA to call should there be question/concern in the interim.  Return in about 9 weeks (around 08/26/2019) for nail trim.

## 2019-06-24 NOTE — Patient Instructions (Signed)

## 2019-06-25 ENCOUNTER — Encounter: Payer: Self-pay | Admitting: Family Medicine

## 2019-07-08 DIAGNOSIS — C61 Malignant neoplasm of prostate: Secondary | ICD-10-CM | POA: Diagnosis not present

## 2019-07-12 ENCOUNTER — Encounter: Payer: Self-pay | Admitting: Family Medicine

## 2019-07-13 ENCOUNTER — Other Ambulatory Visit: Payer: Self-pay

## 2019-07-13 DIAGNOSIS — E039 Hypothyroidism, unspecified: Secondary | ICD-10-CM

## 2019-07-13 MED ORDER — LEVOTHYROXINE SODIUM 50 MCG PO TABS: 50.0000 ug | ORAL_TABLET | Freq: Every day | ORAL | 2 refills | Status: DC

## 2019-07-14 ENCOUNTER — Other Ambulatory Visit: Payer: Self-pay

## 2019-07-14 MED ORDER — MIRTAZAPINE 7.5 MG PO TABS: ORAL_TABLET | ORAL | 2 refills | Status: DC

## 2019-07-15 ENCOUNTER — Other Ambulatory Visit (INDEPENDENT_AMBULATORY_CARE_PROVIDER_SITE_OTHER): Payer: Medicare Other

## 2019-07-15 ENCOUNTER — Other Ambulatory Visit: Payer: Self-pay

## 2019-07-15 DIAGNOSIS — E039 Hypothyroidism, unspecified: Secondary | ICD-10-CM | POA: Diagnosis not present

## 2019-07-15 DIAGNOSIS — Z13220 Encounter for screening for lipoid disorders: Secondary | ICD-10-CM

## 2019-07-15 DIAGNOSIS — F322 Major depressive disorder, single episode, severe without psychotic features: Secondary | ICD-10-CM

## 2019-07-15 DIAGNOSIS — C61 Malignant neoplasm of prostate: Secondary | ICD-10-CM | POA: Diagnosis not present

## 2019-07-15 DIAGNOSIS — N5201 Erectile dysfunction due to arterial insufficiency: Secondary | ICD-10-CM | POA: Diagnosis not present

## 2019-07-15 DIAGNOSIS — R338 Other retention of urine: Secondary | ICD-10-CM | POA: Diagnosis not present

## 2019-07-15 LAB — BASIC METABOLIC PANEL
BUN: 13 mg/dL (ref 6–23)
CO2: 30 mEq/L (ref 19–32)
Calcium: 9.3 mg/dL (ref 8.4–10.5)
Chloride: 95 mEq/L — ABNORMAL LOW (ref 96–112)
Creatinine, Ser: 0.95 mg/dL (ref 0.40–1.50)
GFR: 76.33 mL/min (ref >60.00)
Glucose, Bld: 90 mg/dL (ref 70–99)
Potassium: 4.6 mEq/L (ref 3.5–5.1)
Sodium: 130 mEq/L — ABNORMAL LOW (ref 135–145)

## 2019-07-15 LAB — LIPID PANEL
Cholesterol: 166 mg/dL (ref 0–200)
HDL: 61.2 mg/dL (ref >39.00)
LDL Cholesterol: 85 mg/dL (ref 0–99)
NonHDL: 104.53
Total CHOL/HDL Ratio: 3
Triglycerides: 96 mg/dL (ref 0.0–149.0)
VLDL: 19.2 mg/dL (ref 0.0–40.0)

## 2019-07-15 LAB — CBC
HCT: 41 % (ref 39.0–52.0)
Hemoglobin: 13.8 g/dL (ref 13.0–17.0)
MCHC: 33.7 g/dL (ref 30.0–36.0)
MCV: 87.4 fl (ref 78.0–100.0)
Platelets: 221 10*3/uL (ref 150.0–400.0)
RBC: 4.69 Mil/uL (ref 4.22–5.81)
RDW: 13.7 % (ref 11.5–15.5)
WBC: 4.4 10*3/uL (ref 4.0–10.5)

## 2019-07-15 LAB — TSH: TSH: 4.01 u[IU]/mL (ref 0.35–4.50)

## 2019-07-22 ENCOUNTER — Other Ambulatory Visit: Payer: Self-pay

## 2019-07-22 ENCOUNTER — Encounter: Payer: Self-pay | Admitting: Family Medicine

## 2019-07-22 ENCOUNTER — Ambulatory Visit (INDEPENDENT_AMBULATORY_CARE_PROVIDER_SITE_OTHER): Payer: Medicare Other | Admitting: Family Medicine

## 2019-07-22 VITALS — BP 110/60 | HR 65 | Temp 98.0°F | Ht 70.0 in | Wt 165.2 lb

## 2019-07-22 DIAGNOSIS — E039 Hypothyroidism, unspecified: Secondary | ICD-10-CM

## 2019-07-22 DIAGNOSIS — K5909 Other constipation: Secondary | ICD-10-CM | POA: Diagnosis not present

## 2019-07-22 DIAGNOSIS — E871 Hypo-osmolality and hyponatremia: Secondary | ICD-10-CM | POA: Diagnosis not present

## 2019-07-22 DIAGNOSIS — E538 Deficiency of other specified B group vitamins: Secondary | ICD-10-CM

## 2019-07-22 DIAGNOSIS — F322 Major depressive disorder, single episode, severe without psychotic features: Secondary | ICD-10-CM | POA: Diagnosis not present

## 2019-07-22 DIAGNOSIS — G2581 Restless legs syndrome: Secondary | ICD-10-CM | POA: Diagnosis not present

## 2019-07-22 LAB — BASIC METABOLIC PANEL
BUN: 18 mg/dL (ref 6–23)
CO2: 25 mEq/L (ref 19–32)
Calcium: 9.3 mg/dL (ref 8.4–10.5)
Chloride: 100 mEq/L (ref 96–112)
Creatinine, Ser: 0.84 mg/dL (ref 0.40–1.50)
GFR: 87.98 mL/min (ref >60.00)
Glucose, Bld: 93 mg/dL (ref 70–99)
Potassium: 4.7 mEq/L (ref 3.5–5.1)
Sodium: 132 mEq/L — ABNORMAL LOW (ref 135–145)

## 2019-07-22 LAB — VITAMIN B12: Vitamin B-12: 851 pg/mL (ref 211–911)

## 2019-07-22 MED ORDER — MIRTAZAPINE 7.5 MG PO TABS: 7.5000 mg | ORAL_TABLET | ORAL | 2 refills | Status: DC

## 2019-07-22 NOTE — Assessment & Plan Note (Addendum)
Stable.  Will decrease dose of Remeron as noted above dose of 7.5 mg 3 times weekly.

## 2019-07-22 NOTE — Assessment & Plan Note (Signed)
Currently doing well with his bowel regimen.  Will decrease dose of Remeron to 7.5 mg 3 times weekly to see if this helps.  Advised against use of anticholinergics such as belladonna.

## 2019-07-22 NOTE — Patient Instructions (Signed)
It was very nice to see you today!  We will check blood work today.  It is okay to decrease your Remeron to 3 times weekly.  Please let me know if you would like to start ropinirole for your legs.  Come back in 6 to 12 months, or sooner if needed.  Take care, Dr Jerline Pain  Please try these tips to maintain a healthy lifestyle:   Eat at least 3 REAL meals and 1-2 snacks per day.  Aim for no more than 5 hours between eating.  If you eat breakfast, please do so within one hour of getting up.    Each meal should contain half fruits/vegetables, one quarter protein, and one quarter carbs (no bigger than a computer mouse)   Cut down on sweet beverages. This includes juice, soda, and sweet tea.     Drink at least 1 glass of water with each meal and aim for at least 8 glasses per day   Exercise at least 150 minutes every week.

## 2019-07-22 NOTE — Assessment & Plan Note (Signed)
Discussed treatment options.  Advised against over-the-counter medications due to concern for possibly worsening chronic constipation.  May benefit from small dose of ropinirole.  Patient's wife will look into this and let us know if they are interested in starting.

## 2019-07-22 NOTE — Assessment & Plan Note (Signed)
Last TSH at goal.  We will continue Synthroid 50 mcg daily.  Advised patient's wife that ultrasound screening is not typically recommended routinely.  She voiced understanding and agree.  She would like to revisit again next year.

## 2019-07-22 NOTE — Progress Notes (Signed)
   Jesse [name] is a 79 y.o. male who presents today for an office visit.  Assessment/Plan:  Chronic Problems Addressed Today: Hypothyroidism Last TSH at goal.  We will continue Synthroid 50 mcg daily.  Advised patient's wife that ultrasound screening is not typically recommended routinely.  She voiced understanding and agree.  She would like to revisit again next year.  Chronic constipation Currently doing well with his bowel regimen.  Will decrease dose of Remeron to 7.5 mg 3 times weekly to see if this helps.  Advised against use of anticholinergics such as belladonna.  Depression, major, single episode, severe (HCC) Stable.  Will decrease dose of Remeron as noted above dose of 7.5 mg 3 times weekly.  Restless legs Discussed treatment options.  Advised against over-the-counter medications due to concern for possibly worsening chronic constipation.  May benefit from small dose of ropinirole.  Patient's wife will look into this and let us know if they are interested in starting.  New Problems:  Hyponatremia Recheck BMP today.  Possibly due to poor p.o. intake versus side effect of Remeron.  Low vitamin B12 level Check B12 level today.    Subjective:  HPI:  Patient is here for follow-up visit today.  He was last seen about 4 months ago for abdominal distention and concern for bowel stricture.  Since our last visit he was seen by a surgeon in [place] and diagnosed with rectal stricture.  He had a rectal stent placed for 3 weeks.  Is currently on a bowel regimen and is had significant improvement in his symptoms.  He and his wife are very concerned about chronic constipation.  They are interested in decreasing his dose of Remeron due to concern for constipation.  Medication seems to be helping with his mood.  He has not noticed any other side effects.  Patient also has longstanding history of restless leg syndrome.  Predominantly located in right upper extremity.  Occasionally keeps  him up at night and prevents him from sleeping full nights sleep.  Wife has looked up over-the-counter treatments and is interested in trying belladonna supplementation.       Objective:  Physical Exam: BP 110/60   Pulse 65   Temp 98 F (36.7 C)   Ht 5\' 10"  (1.778 m)   Wt 165 lb 4 oz (75 kg)   SpO2 95%   BMI 23.71 kg/m   Gen: No acute distress, resting comfortably HEENT no thyromegaly or thyroid nodules noted. CV: Regular rate and rhythm with no murmurs appreciated Pulm: Normal work of breathing, clear to auscultation bilaterally with no crackles, wheezes, or rhonchi Neuro: Grossly normal, moves all extremities Psych: Normal affect and thought content  Time Spent: 42 minutes of total time was spent on the date of the encounter performing the following actions: chart review prior to seeing the patient, obtaining history, performing a medically necessary exam, counseling on the treatment plan, placing orders, and documenting in our EHR.        Algis Greenhouse. Jerline Pain, MD 07/22/2019 12:02 PM

## 2019-07-23 NOTE — Progress Notes (Signed)
Please inform patient of the following:  B12 level is normal. Sodium level is improving and back to near his baseline. Do not think we need to do any further testing at this point. We can recheck again when he comes in for his follow up appointment.  Jesse [name]. Jerline Pain, MD 07/23/2019 12:53 PM

## 2019-07-28 ENCOUNTER — Encounter: Payer: Self-pay | Admitting: Family Medicine

## 2019-07-29 ENCOUNTER — Other Ambulatory Visit: Payer: Self-pay

## 2019-07-29 DIAGNOSIS — H9190 Unspecified hearing loss, unspecified ear: Secondary | ICD-10-CM

## 2019-08-13 ENCOUNTER — Telehealth: Payer: Self-pay | Admitting: Family Medicine

## 2019-08-13 NOTE — Telephone Encounter (Signed)
Pt will call back after talking with spouse to get scheduled for Medicare Annual Wellness Visit (AWV) either virtually/audio only OR in office. Whatever the patients preference is.  Last AWV 9.26.18; please schedule at anytime with [facility]Nurse Health Advisor at [facility].

## 2019-08-24 DIAGNOSIS — H903 Sensorineural hearing loss, bilateral: Secondary | ICD-10-CM | POA: Diagnosis not present

## 2019-08-26 ENCOUNTER — Encounter: Payer: Self-pay | Admitting: Family Medicine

## 2019-08-26 ENCOUNTER — Ambulatory Visit (INDEPENDENT_AMBULATORY_CARE_PROVIDER_SITE_OTHER): Payer: Medicare Other | Admitting: Podiatry

## 2019-08-26 ENCOUNTER — Ambulatory Visit: Payer: Medicare Other | Admitting: Podiatry

## 2019-08-26 ENCOUNTER — Other Ambulatory Visit: Payer: Self-pay

## 2019-08-26 ENCOUNTER — Encounter: Payer: Self-pay | Admitting: Podiatry

## 2019-08-26 VITALS — Temp 97.7°F

## 2019-08-26 DIAGNOSIS — B351 Tinea unguium: Secondary | ICD-10-CM | POA: Diagnosis not present

## 2019-08-26 DIAGNOSIS — M79674 Pain in right toe(s): Secondary | ICD-10-CM | POA: Diagnosis not present

## 2019-08-26 DIAGNOSIS — M79675 Pain in left toe(s): Secondary | ICD-10-CM | POA: Diagnosis not present

## 2019-08-26 NOTE — Progress Notes (Signed)
Subjective: Jesse [name] presents today for follow up of painful mycotic nails b/l that are difficult to trim. Pain interferes with ambulation. Aggravating factors include wearing enclosed shoe gear. Pain is relieved with periodic professional debridement.   Allergies  Allergen Reactions  . Other      Objective: Vitals:   08/26/19 0829  Temp: 97.7 F (36.5 C)    Pt 79 y.o. year old male  in NAD. AAO x 3.   Vascular Examination:  Capillary refill time to digits immediate b/l. Palpable DP pulses b/l. Palpable PT pulses b/l. Pedal hair absent b/l Skin temperature gradient within normal limits b/l.  Dermatological Examination: Pedal skin with normal turgor, texture and tone bilaterally. No open wounds bilaterally. No interdigital macerations bilaterally. Toenails 2-5 bilaterally and R hallux elongated, dystrophic, thickened, and crumbly with subungual debris and tenderness to dorsal palpation.   Left hallux nailplate continuing to grow out with distal 1/3 of nailbed exposed and epithelialized. No need for debridement on today. No erythema, no edema, no drainage, no flocculence. Borders with hyperkeratotic debris.  Musculoskeletal: Normal muscle strength 5/5 to all lower extremity muscle groups bilaterally, no gross bony deformities bilaterally and no pain crepitus or joint limitation noted with ROM b/l  Neurological: Protective sensation intact 5/5 intact bilaterally with 10g monofilament b/l Vibratory sensation intact b/l  Assessment: 1. Pain due to onychomycosis of toenails of both feet    Plan: -Toenails 2-5 bilaterally and R hallux debrided in length and girth without iatrogenic bleeding with sterile nail nipper and dremel. Left hallux nailplate not debrided. Borders curretaged without incident. TAO applied. Wife instructed to apply Neosporin to distal edge of nailplate daily. -Patient to continue soft, supportive shoe gear daily. -Patient to report any pedal injuries to medical  professional immediately. -Patient/POA to call should there be question/concern in the interim.  Return in about 9 weeks (around 10/28/2019) for nail trim.

## 2019-08-26 NOTE — Patient Instructions (Signed)

## 2019-08-31 ENCOUNTER — Encounter: Payer: Self-pay | Admitting: Family Medicine

## 2019-09-04 DIAGNOSIS — H25813 Combined forms of age-related cataract, bilateral: Secondary | ICD-10-CM | POA: Diagnosis not present

## 2019-09-14 DIAGNOSIS — Z980 Intestinal bypass and anastomosis status: Secondary | ICD-10-CM | POA: Diagnosis not present

## 2019-09-14 DIAGNOSIS — K624 Stenosis of anus and rectum: Secondary | ICD-10-CM | POA: Diagnosis not present

## 2019-09-23 ENCOUNTER — Telehealth: Payer: Self-pay | Admitting: Family Medicine

## 2019-09-23 NOTE — Telephone Encounter (Signed)
Patient's wife called in and stated that the patient was seeing a dermatologist but he doesn't want to go back. Patient wanted to see if Dr. Jerline Pain could prescribe Betamethasone dipropionate 1%/

## 2019-09-23 NOTE — Telephone Encounter (Signed)
Pleased advised

## 2019-09-25 ENCOUNTER — Other Ambulatory Visit: Payer: Self-pay | Admitting: *Deleted

## 2019-09-25 ENCOUNTER — Other Ambulatory Visit: Payer: Self-pay

## 2019-09-25 NOTE — Telephone Encounter (Signed)
Betamethasone dipropionate  only comes in 0.5% ok to change

## 2019-09-25 NOTE — Telephone Encounter (Signed)
Ok with me. Please place any necessary orders. 

## 2019-09-28 ENCOUNTER — Other Ambulatory Visit: Payer: Self-pay

## 2019-09-28 MED ORDER — BETAMETHASONE DIPROPIONATE 0.05 % EX OINT: TOPICAL_OINTMENT | Freq: Two times a day (BID) | CUTANEOUS | 0 refills | Status: DC

## 2019-09-28 NOTE — Telephone Encounter (Signed)
Notified via voicemail Rx sent in.

## 2019-09-28 NOTE — Telephone Encounter (Signed)
[place] with me.   Algis Greenhouse. Jerline Pain, MD 09/28/2019 12:33 PM

## 2019-10-30 ENCOUNTER — Other Ambulatory Visit: Payer: Self-pay

## 2019-10-30 ENCOUNTER — Ambulatory Visit (INDEPENDENT_AMBULATORY_CARE_PROVIDER_SITE_OTHER): Payer: Medicare Other | Admitting: Podiatry

## 2019-10-30 ENCOUNTER — Encounter: Payer: Self-pay | Admitting: Podiatry

## 2019-10-30 DIAGNOSIS — M79674 Pain in right toe(s): Secondary | ICD-10-CM | POA: Diagnosis not present

## 2019-10-30 DIAGNOSIS — B351 Tinea unguium: Secondary | ICD-10-CM | POA: Diagnosis not present

## 2019-10-30 DIAGNOSIS — M79675 Pain in left toe(s): Secondary | ICD-10-CM

## 2019-10-30 NOTE — Progress Notes (Signed)
Subjective: Jesse [name] is a 79 y.o. male patient seen today painful mycotic nails b/l that are difficult to trim. Pain interferes with ambulation. Aggravating factors include wearing enclosed shoe gear. Pain is relieved with periodic professional debridement.   His wife is present during the visit. They voice no new pedal concerns on today's visit.  Patient Active Problem List   Diagnosis Date Noted  . Restless legs 07/22/2019  . Large bowel obstruction ([place]) 03/07/2019  . History of skin cancer 01/12/2019  . Erectile dysfunction 01/12/2019  . Anxiety 01/12/2019  . Volvulus of sigmoid colon s/p flex sig/rectal tube 09/27/2017 09/27/2017  . Mild dementia ([place]) 09/27/2017  . Chronic constipation 09/27/2017  . Osteoarthritis of right hip 06/05/2016  . Prostate cancer ([place]) 12/07/2015  . Hypothyroidism 12/07/2015    Current Outpatient Medications on File Prior to Visit  Medication Sig Dispense Refill  . betamethasone dipropionate (DIPROLENE) 0.05 % ointment Apply topically 2 (two) times daily. 30 g 0  . clotrimazole-betamethasone (LOTRISONE) cream APPLY CREAM TOPICALLY TO AFFECTED AREA ONCE DAILY AS NEEDED    . Cyanocobalamin (VITAMIN B 12 PO) Take 1,000 Units by mouth daily.     Marland Kitchen levothyroxine (SYNTHROID) 50 MCG tablet Take 1 tablet (50 mcg total) by mouth daily before breakfast. 90 tablet 2  . magnesium hydroxide (MILK OF MAGNESIA) 400 MG/5ML suspension Take by mouth daily as needed for mild constipation.    . mirtazapine (REMERON) 7.5 MG tablet Take 1 tablet (7.5 mg total) by mouth 3 (three) times a week. 90 tablet 2  . Multiple Vitamins-Minerals (MULTIVITAMIN ADULT PO) Take 1 tablet by mouth daily. One a Day    . nystatin cream (MYCOSTATIN) APPLY CREAM TOPICALLY TWICE DAILY    . polyethylene glycol powder (MIRALAX) 17 GM/SCOOP powder as needed.     . sildenafil (VIAGRA) 50 MG tablet Take 50 mg by mouth as needed.      No current facility-administered medications on file prior to  visit.    Allergies  Allergen Reactions  . Other     Objective: Physical Exam  General: Jesse [name] is a pleasant 79 y.o. Caucaisan male, in NAD. AAO x 3.   Vascular:  Neurovascular status unchanged b/l lower extremities. Capillary refill time to digits immediate b/l. Palpable DP pulses b/l. Palpable PT pulses b/l. Pedal hair absent b/l. Skin temperature gradient within normal limits b/l. No edema noted b/l.  Dermatological:  Pedal skin with normal turgor, texture and tone bilaterally. No open wounds bilaterally. No interdigital macerations bilaterally. Toenails 1-5 right, L 2nd toe, L 3rd toe, L 4th toe and L 5th toe elongated, discolored, dystrophic, thickened, and crumbly with subungual debris and tenderness to dorsal palpation.   Left great toe mycotic toenail continuing to grow out well. No signs of infection.   Musculoskeletal:  Normal muscle strength 5/5 to all lower extremity muscle groups bilaterally. No pain crepitus or joint limitation noted with ROM b/l. No gross bony deformities bilaterally.  Neurological:  Protective sensation intact 5/5 intact bilaterally with 10g monofilament b/l. Vibratory sensation intact b/l.  Assessment and Plan:  1. Pain due to onychomycosis of toenails of both feet    -Examined patient. -No new findings. No new orders. -Toenails 2-5 bilaterally and R hallux debrided in length and girth without iatrogenic bleeding with sterile nail nipper and dremel. Wife instructed to apply Neosporin Ointment to left great toe.  -Patient to continue soft, supportive shoe gear daily. -Patient to report any pedal injuries to medical professional  immediately. -Patient/POA to call should there be question/concern in the interim.  Return in about 9 weeks (around 01/01/2020) for nail trim.  Marzetta Board, DPM

## 2019-11-10 ENCOUNTER — Ambulatory Visit: Payer: Medicare Other | Admitting: Podiatry

## 2020-01-08 ENCOUNTER — Encounter: Payer: Self-pay | Admitting: Podiatry

## 2020-01-08 ENCOUNTER — Other Ambulatory Visit: Payer: Self-pay

## 2020-01-08 ENCOUNTER — Ambulatory Visit (INDEPENDENT_AMBULATORY_CARE_PROVIDER_SITE_OTHER): Payer: Medicare Other | Admitting: Podiatry

## 2020-01-08 DIAGNOSIS — M79675 Pain in left toe(s): Secondary | ICD-10-CM | POA: Diagnosis not present

## 2020-01-08 DIAGNOSIS — B351 Tinea unguium: Secondary | ICD-10-CM | POA: Diagnosis not present

## 2020-01-08 DIAGNOSIS — M79674 Pain in right toe(s): Secondary | ICD-10-CM | POA: Diagnosis not present

## 2020-01-09 NOTE — Progress Notes (Signed)
Subjective: Jesse [name] is a 80 y.o. male patient seen today painful mycotic nails b/l that are difficult to trim. Pain interferes with ambulation. Aggravating factors include wearing enclosed shoe gear. Pain is relieved with periodic professional debridement.   His wife is present during the visit. They voice no new pedal concerns on today's visit.  Patient Active Problem List   Diagnosis Date Noted  . Restless legs 07/22/2019  . Large bowel obstruction ([place]) 03/07/2019  . History of skin cancer 01/12/2019  . Erectile dysfunction 01/12/2019  . Anxiety 01/12/2019  . Volvulus of sigmoid colon s/p flex sig/rectal tube 09/27/2017 09/27/2017  . Mild dementia ([place]) 09/27/2017  . Chronic constipation 09/27/2017  . Osteoarthritis of right hip 06/05/2016  . Prostate cancer ([place]) 12/07/2015  . Hypothyroidism 12/07/2015    Current Outpatient Medications on File Prior to Visit  Medication Sig Dispense Refill  . betamethasone dipropionate (DIPROLENE) 0.05 % ointment Apply topically 2 (two) times daily. 30 g 0  . clotrimazole-betamethasone (LOTRISONE) cream APPLY CREAM TOPICALLY TO AFFECTED AREA ONCE DAILY AS NEEDED    . Cyanocobalamin (VITAMIN B 12 PO) Take 1,000 Units by mouth daily.     Marland Kitchen levothyroxine (SYNTHROID) 50 MCG tablet Take 1 tablet (50 mcg total) by mouth daily before breakfast. 90 tablet 2  . magnesium hydroxide (MILK OF MAGNESIA) 400 MG/5ML suspension Take by mouth daily as needed for mild constipation.    . mirtazapine (REMERON) 7.5 MG tablet Take 1 tablet (7.5 mg total) by mouth 3 (three) times a week. 90 tablet 2  . Multiple Vitamins-Minerals (MULTIVITAMIN ADULT PO) Take 1 tablet by mouth daily. One a Day    . nystatin cream (MYCOSTATIN) APPLY CREAM TOPICALLY TWICE DAILY    . polyethylene glycol powder (MIRALAX) 17 GM/SCOOP powder as needed.     . sildenafil (VIAGRA) 50 MG tablet Take 50 mg by mouth as needed.      No current facility-administered medications on file prior to  visit.    Allergies  Allergen Reactions  . Other     Objective: Physical Exam  General: Jesse [name] is a pleasant 79 y.o. Caucaisan male, in NAD. AAO x 3.   Vascular:  Neurovascular status unchanged b/l lower extremities. Capillary refill time to digits immediate b/l. Palpable DP pulses b/l. Palpable PT pulses b/l. Pedal hair absent b/l. Skin temperature gradient within normal limits b/l. No edema noted b/l.  Dermatological:  Pedal skin with normal turgor, texture and tone bilaterally. No open wounds bilaterally. No interdigital macerations bilaterally. Toenails 1-5 right, L 2nd toe, L 3rd toe, L 4th toe and L 5th toe elongated, discolored, dystrophic, thickened, and crumbly with subungual debris and tenderness to dorsal palpation.   Left great toe mycotic toenail continuing to grow out well. No signs of infection.   Right great toe with evidence of old nail distal 1/2 of naiilplate and new nil proximal 1/2 of nailplate.   Musculoskeletal:  Normal muscle strength 5/5 to all lower extremity muscle groups bilaterally. No pain crepitus or joint limitation noted with ROM b/l. No gross bony deformities bilaterally.  Neurological:  Protective sensation intact 5/5 intact bilaterally with 10g monofilament b/l. Vibratory sensation intact b/l.  Assessment and Plan:  1. Pain due to onychomycosis of toenails of both feet    -Examined patient. -No new findings. No new orders. -Toenails 2-5 bilaterally and R hallux debrided in length and girth without iatrogenic bleeding with sterile nail nipper and dremel. Wife instructed to apply Neosporin Ointment to  both great toe borders once daily.  -Patient to continue soft, supportive shoe gear daily. -Patient to report any pedal injuries to medical professional immediately. -Patient/POA to call should there be question/concern in the interim.  Return in about 9 weeks (around 03/11/2020) for nail trim.  Marzetta Board, DPM

## 2020-01-15 DIAGNOSIS — Z8546 Personal history of malignant neoplasm of prostate: Secondary | ICD-10-CM | POA: Diagnosis not present

## 2020-01-15 DIAGNOSIS — R338 Other retention of urine: Secondary | ICD-10-CM | POA: Diagnosis not present

## 2020-01-15 DIAGNOSIS — N481 Balanitis: Secondary | ICD-10-CM | POA: Diagnosis not present

## 2020-01-19 ENCOUNTER — Encounter: Payer: Self-pay | Admitting: Family Medicine

## 2020-01-19 ENCOUNTER — Ambulatory Visit (INDEPENDENT_AMBULATORY_CARE_PROVIDER_SITE_OTHER): Payer: Medicare Other | Admitting: Family Medicine

## 2020-01-19 ENCOUNTER — Other Ambulatory Visit: Payer: Self-pay

## 2020-01-19 VITALS — BP 106/64 | HR 69 | Temp 97.9°F | Ht 70.0 in | Wt 164.2 lb

## 2020-01-19 DIAGNOSIS — F419 Anxiety disorder, unspecified: Secondary | ICD-10-CM

## 2020-01-19 DIAGNOSIS — R000 Tachycardia, unspecified: Secondary | ICD-10-CM

## 2020-01-19 DIAGNOSIS — K5909 Other constipation: Secondary | ICD-10-CM | POA: Diagnosis not present

## 2020-01-19 DIAGNOSIS — E039 Hypothyroidism, unspecified: Secondary | ICD-10-CM

## 2020-01-19 MED ORDER — MIRTAZAPINE 7.5 MG PO TABS
3.7500 mg | ORAL_TABLET | ORAL | 2 refills | Status: DC
Start: 2020-01-20 — End: 2020-08-26

## 2020-01-19 MED ORDER — LEVOTHYROXINE SODIUM 50 MCG PO TABS: 50.0000 ug | ORAL_TABLET | Freq: Every day | ORAL | 2 refills | Status: DC

## 2020-01-19 MED ORDER — MIRTAZAPINE 7.5 MG PO TABS: 3.7500 mg | ORAL_TABLET | ORAL | 2 refills | Status: DC

## 2020-01-19 NOTE — Progress Notes (Signed)
   Jesse [name] is a 79 y.o. male who presents today for an office visit.  Assessment/Plan:  New/Acute Problems: Tachycardia Regular rate and rhythm today.  No red flags.  Will check Holter monitor to evaluate for paroxysmal atrial fibrillation.  May need follow-up with cardiology depending on the results.  Chronic Problems Addressed Today: Anxiety We will decrease Remeron to 3.75 mg 3 times weekly.  They will do this for a couple of months and then stop completely.  Chronic constipation Doing well.  Having soft bowel movement daily.  Will continue bowel regimen with MiraLAX and milk of magnesia.     Subjective:  HPI: Patient today for follow-up.  Has been weaning down on Remeron.  Is doing 7.5 mg 3 times weekly.  Seems to be doing well.  Wife is also concerned because she has noticed irregular heart rhythm occasionally over the last few weeks.  He has had a history of atrial fibrillation.  Patient does not have any symptoms.  No shortness of breath.  No chest pain.  Needs refill on Synthroid today.       Objective:  Physical Exam: BP 106/64   Pulse 69   Temp 97.9 F (36.6 C) (Temporal)   Ht 5\' 10"  (1.778 m)   Wt 164 lb 3.2 oz (74.5 kg)   SpO2 98%   BMI 23.56 kg/m   Gen: No acute distress, resting comfortably CV: Regular rate and rhythm with no murmurs appreciated Pulm: Normal work of breathing, clear to auscultation bilaterally with no crackles, wheezes, or rhonchi Neuro: Grossly normal, moves all extremities Psych: Normal affect and thought content      Tahirah Sara M. Jerline Pain, MD 01/19/2020 11:32 AM

## 2020-01-19 NOTE — Assessment & Plan Note (Signed)
Doing well.  Having soft bowel movement daily.  Will continue bowel regimen with MiraLAX and milk of magnesia.

## 2020-01-19 NOTE — Patient Instructions (Signed)
It was very nice to see you today!  I will send in your medications.  We will order a heart monitor.  You should be called within the next few days about getting this set up.  Take care, Dr Jerline Pain  Please try these tips to maintain a healthy lifestyle:   Eat at least 3 REAL meals and 1-2 snacks per day.  Aim for no more than 5 hours between eating.  If you eat breakfast, please do so within one hour of getting up.    Each meal should contain half fruits/vegetables, one quarter protein, and one quarter carbs (no bigger than a computer mouse)   Cut down on sweet beverages. This includes juice, soda, and sweet tea.     Drink at least 1 glass of water with each meal and aim for at least 8 glasses per day   Exercise at least 150 minutes every week.

## 2020-01-19 NOTE — Assessment & Plan Note (Signed)
We will decrease Remeron to 3.75 mg 3 times weekly.  They will do this for a couple of months and then stop completely.

## 2020-01-23 ENCOUNTER — Other Ambulatory Visit (INDEPENDENT_AMBULATORY_CARE_PROVIDER_SITE_OTHER): Payer: Medicare Other

## 2020-01-23 DIAGNOSIS — R000 Tachycardia, unspecified: Secondary | ICD-10-CM

## 2020-02-02 DIAGNOSIS — R000 Tachycardia, unspecified: Secondary | ICD-10-CM | POA: Diagnosis not present

## 2020-02-15 ENCOUNTER — Telehealth: Payer: Self-pay | Admitting: Cardiovascular Disease

## 2020-02-15 ENCOUNTER — Telehealth: Payer: Self-pay

## 2020-02-15 NOTE — Telephone Encounter (Signed)
New Message:   [name] says she would like to talk to [place]. please. She says she have some concerns about the pt and wants the pt to see Dr C asap. She would like for him to be seen asap.

## 2020-02-15 NOTE — Telephone Encounter (Signed)
Returned the call to the patient's wife. She was calling to state that the patient was having episodes of shortness of breath that coincided with an abnormal rhythm. The patient did see PCP concerning this and was ordered a 3 day monitor The results showed SVTs. The patient's wife has requested an appointment with Dr. Sallyanne Kuster. One has been made for 02/19/20. She will bring a copy of the monitor results.

## 2020-02-15 NOTE — Telephone Encounter (Signed)
err

## 2020-02-19 ENCOUNTER — Ambulatory Visit (INDEPENDENT_AMBULATORY_CARE_PROVIDER_SITE_OTHER): Payer: Medicare Other | Admitting: Cardiovascular Disease

## 2020-02-19 ENCOUNTER — Other Ambulatory Visit: Payer: Self-pay

## 2020-02-19 ENCOUNTER — Encounter: Payer: Self-pay | Admitting: Cardiovascular Disease

## 2020-02-19 VITALS — BP 107/66 | HR 64 | Ht 71.0 in | Wt 164.0 lb

## 2020-02-19 DIAGNOSIS — F03A0 Unspecified dementia, mild, without behavioral disturbance, psychotic disturbance, mood disturbance, and anxiety: Secondary | ICD-10-CM

## 2020-02-19 DIAGNOSIS — Z7184 Encounter for health counseling related to travel: Secondary | ICD-10-CM

## 2020-02-19 DIAGNOSIS — E039 Hypothyroidism, unspecified: Secondary | ICD-10-CM | POA: Diagnosis not present

## 2020-02-19 DIAGNOSIS — F0390 Unspecified dementia without behavioral disturbance: Secondary | ICD-10-CM

## 2020-02-19 DIAGNOSIS — I4719 Other supraventricular tachycardia: Secondary | ICD-10-CM

## 2020-02-19 DIAGNOSIS — I9789 Other postprocedural complications and disorders of the circulatory system, not elsewhere classified: Secondary | ICD-10-CM | POA: Diagnosis not present

## 2020-02-19 DIAGNOSIS — I471 Supraventricular tachycardia: Secondary | ICD-10-CM

## 2020-02-19 DIAGNOSIS — I4891 Unspecified atrial fibrillation: Secondary | ICD-10-CM

## 2020-02-19 NOTE — Progress Notes (Signed)
Cardiology office note    Date:  02/19/2020   ID:  Jesse [name], DOB 1941/03/31, MRN [id number]  PCP:  Vivi Barrack, MD  Cardiologist:   Sanda Klein, MD   Chief Complaint  Patient presents with  .  Arrhythmia    History of Present Illness:  Jesse [name] is a 79 y.o. male with a remote history of a single episode of paroxysmal atrial fibrillation that occurred in the postoperative setting (cervical laminectomy 2013). He was briefly treated with amiodarone and anticoagulants but since no arrhythmia recurrence was detected he has stopped all cardiac medications. He does not have known structural heart disease.  As always, [name] is accompanied by his wife, who is a retired Marine scientist.  His wife has noticed that he will have occasional unexplained episodes of increased respiratory rate at rest, not necessarily associated with any particular physical or emotional trigger.  She was wondering whether they could reflect otherwise asymptomatic arrhythmia.  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  He denies any chest pain at rest or with exertion, dyspnea at rest or with exertion, orthopnea, paroxysmal nocturnal dyspnea, syncope, palpitations, focal neurological deficits, intermittent claudication, lower extremity edema, unexplained weight gain, cough, hemoptysis or wheezing.  They are planning a trip to [place] in [place], at roughly 10,000 feet elevation above sea level.  He has visited that area before and did not have problems with altitude sickness.  They have not been there in a few years, and his wife wanted to make sure that he is up for the trip.  He wore a 3-day event monitor that showed occasional very brief bursts of paroxysmal atrial tachycardia.  He is recovering from a prolonged period of depression and is gradually weaning off his antidepressant medications.  He is now taking Remeron only every other daily.  He has a history of partial colectomy for sigmoid volvulus,  prostate TURP surgery as well as multiple orthopedic procedures. He had radiation therapy for prostate carcinoma. He has treated hypothyroidism, but does not have any other chronic medical problems.   Past Medical History:  Diagnosis Date  . Arthritis   . Atrial fibrillation ([place])   . Complication of anesthesia    Atrial fibrillation during surgery   . Dementia ([place])   . Depression   . Hard of hearing   . Hx of constipation   . Osteoporosis    DEXA 11/09/10. Completed treatemtn with Fosamax.  . Prostate cancer ([place])   . Restless legs   . Thyroid disease    hypothyroidism  . Vitiligo     Past Surgical History:  Procedure Laterality Date  . BACK SURGERY    . COLONOSCOPY    . FLEXIBLE SIGMOIDOSCOPY N/A 09/27/2017   Procedure: FLEXIBLE SIGMOIDOSCOPY;  Surgeon: Gatha Mayer, MD;  Location: [facility];  Service: Endoscopy;  Laterality: N/A;  . JOINT REPLACEMENT    . LAMINECTOMY  10/2011   cervical and lumbar laminectomy  . PROSTATE BIOPSY  07/10/2011  . PROSTATE BIOPSY  09/22/15  . TOTAL HIP ARTHROPLASTY Right 02/29/2012  . TOTAL HIP ARTHROPLASTY Right 07/04/2016   Procedure: RIGHT TOTAL HIP ARTHROPLASTY ANTERIOR APPROACH;  Surgeon: Rod Can, MD;  Location: [facility];  Service: Orthopedics;  Laterality: Right;  . TRANSURETHRAL RESECTION OF PROSTATE      Current Medications: Outpatient Medications Prior to Visit  Medication Sig Dispense Refill  . clotrimazole-betamethasone (LOTRISONE) cream APPLY CREAM TOPICALLY TO AFFECTED AREA ONCE DAILY AS NEEDED    .  Cyanocobalamin (VITAMIN B 12 PO) Take 1,000 Units by mouth 3 (three) times a week.     . levothyroxine (SYNTHROID) 50 MCG tablet Take 1 tablet (50 mcg total) by mouth daily before breakfast. 90 tablet 2  . magnesium hydroxide (MILK OF MAGNESIA) 400 MG/5ML suspension Take by mouth daily as needed for mild constipation.    . mirtazapine (REMERON) 7.5 MG tablet Take 0.5 tablets (3.75 mg total) by mouth 3 (three) times a week. 90  tablet 2  . Multiple Vitamins-Minerals (MULTIVITAMIN ADULT PO) Take 1 tablet by mouth daily. One a Day    . nystatin cream (MYCOSTATIN) APPLY CREAM TOPICALLY TWICE DAILY    . polyethylene glycol powder (MIRALAX) 17 GM/SCOOP powder as needed.     . sildenafil (VIAGRA) 50 MG tablet Take 50 mg by mouth as needed.     . betamethasone dipropionate (DIPROLENE) 0.05 % ointment Apply topically 2 (two) times daily. 30 g 0   No facility-administered medications prior to visit.     Allergies:   Other   Social History   Socioeconomic History  . Marital status: Married    Spouse name: Not on file  . Number of children: Not on file  . Years of education: Not on file  . Highest education level: Not on file  Occupational History  . Not on file  Tobacco Use  . Smoking status: Former Smoker    Types: Cigarettes    Quit date: 05/28/1974    Years since quitting: 45.7  . Smokeless tobacco: Never Used  . Tobacco comment: smoked very little   Vaping Use  . Vaping Use: Never used  Substance and Sexual Activity  . Alcohol use: Yes    Comment: 4-5 standard drinks per week/ stopped drinkin   . Drug use: No  . Sexual activity: Yes  Other Topics Concern  . Not on file  Social History Narrative  . Not on file   Social Determinants of Health   Financial Resource Strain:   . Difficulty of Paying Living Expenses: Not on file  Food Insecurity:   . Worried About Charity fundraiser in the Last Year: Not on file  . Ran Out of Food in the Last Year: Not on file  Transportation Needs:   . Lack of Transportation (Medical): Not on file  . Lack of Transportation (Non-Medical): Not on file  Physical Activity:   . Days of Exercise per Week: Not on file  . Minutes of Exercise per Session: Not on file  Stress:   . Feeling of Stress : Not on file  Social Connections:   . Frequency of Communication with Friends and Family: Not on file  . Frequency of Social Gatherings with Friends and Family: Not on file    . Attends Religious Services: Not on file  . Active Member of Clubs or Organizations: Not on file  . Attends Archivist Meetings: Not on file  . Marital Status: Not on file     Family History:  The patient's family history includes Atrial fibrillation in his mother and sister; Cystic fibrosis in his mother.   ROS:   Please see the history of present illness.    ROS All other systems are reviewed and are negative.   PHYSICAL EXAM:   VS:  BP 107/66   Pulse 64   Ht 5\' 11"  (1.803 m)   Wt 164 lb (74.4 kg)   SpO2 97%   BMI 22.87 kg/m  General: Alert, oriented x3, no distress, appears well.  As always he is rather withdrawn and allows his wife to carry a conversation for him. Head: no evidence of trauma, PERRL, EOMI, no exophtalmos or lid lag, no myxedema, no xanthelasma; normal ears, nose and oropharynx Neck: normal jugular venous pulsations and no hepatojugular reflux; brisk carotid pulses without delay and no carotid bruits Chest: clear to auscultation, no signs of consolidation by percussion or palpation, normal fremitus, symmetrical and full respiratory excursions Cardiovascular: normal position and quality of the apical impulse, regular rhythm, normal first and second heart sounds, no murmurs, rubs or gallops Abdomen: no tenderness or distention, no masses by palpation, no abnormal pulsatility or arterial bruits, normal bowel sounds, no hepatosplenomegaly Extremities: no clubbing, cyanosis or edema; 2+ radial, ulnar and brachial pulses bilaterally; 2+ right femoral, posterior tibial and dorsalis pedis pulses; 2+ left femoral, posterior tibial and dorsalis pedis pulses; no subclavian or femoral bruits Neurological: grossly nonfocal Psych: Normal mood and affect    Wt Readings from Last 3 Encounters:  02/19/20 164 lb (74.4 kg)  01/19/20 164 lb 3.2 oz (74.5 kg)  07/22/19 165 lb 4 oz (75 kg)      Studies/Labs Reviewed:   EKG:  EKG is ordered today.  It shows  normal sinus rhythm, normal tracing  Recent Labs: 07/15/2019: Hemoglobin 13.8; Platelets 221.0; TSH 4.01 07/22/2019: BUN 18; Creatinine, Ser 0.84; Potassium 4.7; Sodium 132    ASSESSMENT:    1. PAT (paroxysmal atrial tachycardia) ([place])   2. Postoperative atrial fibrillation ([facility])   3. Hypothyroidism, unspecified type   4. Mild dementia ([place])   5. Counseling for travel      PLAN:  In order of problems listed above:  1. PAT: He has occasional ectopic atrial tachycardia that appears to be largely asymptomatic and is always very brief.  He had only one brief episode of postoperative atrial fibrillation following general anesthesia and cervical colon ectomy in 2013, but no serious atrial arrhythmia has been recorded during multiple subsequent surgical procedures including sigmoid colectomy last year.  Antiarrhythmics do not appear to be indicated, neither does anticoagulation.   2. Hypothyroidism: On replacement therapy.  Recent TSH in normal range at 4.010. 3. Depression and/or dementia: sees Dr. Jaynee Eagles and Dr. Peggye Pitt. 4. Travel plans: Other than the normal advice regarding travel to altitude and acclimatization, I do not think he needs to take any particular precautions.  He has done well at similar altitude in the past.  He has never required acetazolamide.    Medication Adjustments/Labs and Tests Ordered: Current medicines are reviewed at length with the patient today.  Concerns regarding medicines are outlined above.  Medication changes, Labs and Tests ordered today are listed in the Patient Instructions below. Patient Instructions  Medication Instructions:  No changes  *If you need a refill on your cardiac medications before your next appointment, please call your pharmacy*   Lab Work: None  If you have labs (blood work) drawn today and your tests are completely normal, you will receive your results only by: Marland Kitchen MyChart Message (if you have MyChart) OR . A paper copy in the  mail If you have any lab test that is abnormal or we need to change your treatment, we will call you to review the results.   Testing/Procedures: None   Follow-Up: At [facility], you and your health needs are our priority.  As part of our continuing mission to provide you with exceptional heart care, we have created designated  Provider Care Teams.  These Care Teams include your primary Cardiologist (physician) and Advanced Practice Providers (APPs -  Physician Assistants and Nurse Practitioners) who all work together to provide you with the care you need, when you need it.  We recommend signing up for the patient portal called "MyChart".  Sign up information is provided on this After Visit Summary.  MyChart is used to connect with patients for Virtual Visits (Telemedicine).  Patients are able to view lab/test results, encounter notes, upcoming appointments, etc.  Non-urgent messages can be sent to your provider as well.   To learn more about what you can do with MyChart, go to [email or web address].    Your next appointment:   12 month(s)  The format for your next appointment:   In Person  Provider:   Sanda Klein, MD   Other Instructions None     Signed, Sanda Klein, MD  02/19/2020 11:58 AM    [address], [place], [place]  [address] Phone: [phone]; Fax: [phone]

## 2020-02-19 NOTE — Patient Instructions (Signed)
Medication Instructions:  No changes  *If you need a refill on your cardiac medications before your next appointment, please call your pharmacy*   Lab Work: None  If you have labs (blood work) drawn today and your tests are completely normal, you will receive your results only by: Marland Kitchen MyChart Message (if you have MyChart) OR . A paper copy in the mail If you have any lab test that is abnormal or we need to change your treatment, we will call you to review the results.   Testing/Procedures: None   Follow-Up: At [facility], you and your health needs are our priority.  As part of our continuing mission to provide you with exceptional heart care, we have created designated Provider Care Teams.  These Care Teams include your primary Cardiologist (physician) and Advanced Practice Providers (APPs -  Physician Assistants and Nurse Practitioners) who all work together to provide you with the care you need, when you need it.  We recommend signing up for the patient portal called "MyChart".  Sign up information is provided on this After Visit Summary.  MyChart is used to connect with patients for Virtual Visits (Telemedicine).  Patients are able to view lab/test results, encounter notes, upcoming appointments, etc.  Non-urgent messages can be sent to your provider as well.   To learn more about what you can do with MyChart, go to [email or web address].    Your next appointment:   12 month(s)  The format for your next appointment:   In Person  Provider:   Sanda Klein, MD   Other Instructions None

## 2020-02-22 NOTE — Progress Notes (Signed)
Please inform patient of the following:  He had occasional arrhthymias on his holter monitor that I think explain his symptoms. Recommend referral to cardiology.  Jesse [name]. Jerline Pain, MD 02/22/2020 9:40 PM

## 2020-02-24 ENCOUNTER — Encounter: Payer: Self-pay | Admitting: Family Medicine

## 2020-02-24 DIAGNOSIS — Z23 Encounter for immunization: Secondary | ICD-10-CM | POA: Diagnosis not present

## 2020-02-26 DIAGNOSIS — Z23 Encounter for immunization: Secondary | ICD-10-CM | POA: Diagnosis not present

## 2020-03-09 DIAGNOSIS — Z20822 Contact with and (suspected) exposure to covid-19: Secondary | ICD-10-CM | POA: Diagnosis not present

## 2020-03-14 ENCOUNTER — Ambulatory Visit: Payer: Medicare Other | Admitting: Podiatry

## 2020-05-13 ENCOUNTER — Ambulatory Visit: Payer: Medicare Other | Admitting: Podiatry

## 2020-06-14 ENCOUNTER — Other Ambulatory Visit: Payer: Self-pay

## 2020-06-14 ENCOUNTER — Ambulatory Visit (INDEPENDENT_AMBULATORY_CARE_PROVIDER_SITE_OTHER): Payer: Medicare Other | Admitting: Podiatry

## 2020-06-14 ENCOUNTER — Encounter: Payer: Self-pay | Admitting: Podiatry

## 2020-06-14 DIAGNOSIS — B351 Tinea unguium: Secondary | ICD-10-CM

## 2020-06-14 DIAGNOSIS — M79674 Pain in right toe(s): Secondary | ICD-10-CM

## 2020-06-14 DIAGNOSIS — M79675 Pain in left toe(s): Secondary | ICD-10-CM

## 2020-06-14 NOTE — Progress Notes (Signed)
This patient returns to the office for evaluation and treatment of long thick painful nails .  This patient is unable to trim his own nails since the patient cannot reach his feet.  Patient says the nails are painful walking and wearing his shoes. He presents to the office with is wife.  He returns for preventive foot care services.  General Appearance  Alert, conversant and in no acute stress.  Vascular  Dorsalis pedis and posterior tibial  pulses are palpable  bilaterally.  Capillary return is within normal limits  bilaterally. Temperature is within normal limits  Bilaterally.  Absent digital hair.  Neurologic  Senn-Weinstein monofilament wire test within normal limits  bilaterally. Muscle power within normal limits bilaterally.  Nails Thick disfigured discolored nails with subungual debris  from hallux to fifth toes bilaterally. No evidence of bacterial infection or drainage bilaterally.  Orthopedic  No limitations of motion  feet .  No crepitus or effusions noted.  No bony pathology or digital deformities noted.  Skin  normotropic skin with no porokeratosis noted bilaterally.  No signs of infections or ulcers noted.     Onychomycosis  Pain in toes right foot  Pain in toes left foot  Debridement  of nails  1-5  B/L with a nail nipper.  Nails were then filed using a dremel tool with no incidents.    RTC  9 weeks   Kamryn Gauthier DPM  

## 2020-08-16 ENCOUNTER — Telehealth: Payer: Self-pay | Admitting: Family Medicine

## 2020-08-16 NOTE — Telephone Encounter (Signed)
Left message for patient to call back and schedule Medicare Annual Wellness Visit (AWV) either virtually OR in office.   Last AWV 02/20/17; please schedule at anytime with [facility]-Nurse Health Advisor at [facility].  This should be a 45 minute visit.

## 2020-08-19 ENCOUNTER — Other Ambulatory Visit: Payer: Self-pay

## 2020-08-19 ENCOUNTER — Ambulatory Visit (INDEPENDENT_AMBULATORY_CARE_PROVIDER_SITE_OTHER): Payer: Medicare Other | Admitting: Podiatry

## 2020-08-19 ENCOUNTER — Encounter: Payer: Self-pay | Admitting: Podiatry

## 2020-08-19 DIAGNOSIS — M79675 Pain in left toe(s): Secondary | ICD-10-CM

## 2020-08-19 DIAGNOSIS — B351 Tinea unguium: Secondary | ICD-10-CM

## 2020-08-19 DIAGNOSIS — M79674 Pain in right toe(s): Secondary | ICD-10-CM | POA: Diagnosis not present

## 2020-08-19 NOTE — Progress Notes (Signed)
This patient returns to the office for evaluation and treatment of long thick painful nails .  This patient is unable to trim his own nails since the patient cannot reach his feet.  Patient says the nails are painful walking and wearing his shoes. He presents to the office with is wife.  He returns for preventive foot care services.  General Appearance  Alert, conversant and in no acute stress.  Vascular  Dorsalis pedis and posterior tibial  pulses are palpable  bilaterally.  Capillary return is within normal limits  bilaterally. Temperature is within normal limits  Bilaterally.  Absent digital hair.  Neurologic  Senn-Weinstein monofilament wire test within normal limits  bilaterally. Muscle power within normal limits bilaterally.  Nails Thick disfigured discolored nails with subungual debris  from hallux to fifth toes bilaterally. No evidence of bacterial infection or drainage bilaterally.  Orthopedic  No limitations of motion  feet .  No crepitus or effusions noted.  No bony pathology or digital deformities noted.  Skin  normotropic skin with no porokeratosis noted bilaterally.  No signs of infections or ulcers noted.     Onychomycosis  Pain in toes right foot  Pain in toes left foot  Debridement  of nails  1-5  B/L with a nail nipper.  Nails were then filed using a dremel tool with no incidents.    RTC  9 weeks   Madelyn Tlatelpa DPM  

## 2020-08-26 ENCOUNTER — Other Ambulatory Visit: Payer: Self-pay

## 2020-08-26 ENCOUNTER — Ambulatory Visit: Payer: Medicare Other | Admitting: Podiatry

## 2020-08-26 ENCOUNTER — Ambulatory Visit (INDEPENDENT_AMBULATORY_CARE_PROVIDER_SITE_OTHER): Payer: Medicare Other | Admitting: Family Medicine

## 2020-08-26 ENCOUNTER — Encounter: Payer: Self-pay | Admitting: Family Medicine

## 2020-08-26 VITALS — BP 90/53 | HR 69 | Temp 98.1°F | Ht 71.0 in | Wt 162.0 lb

## 2020-08-26 DIAGNOSIS — R739 Hyperglycemia, unspecified: Secondary | ICD-10-CM | POA: Diagnosis not present

## 2020-08-26 DIAGNOSIS — C61 Malignant neoplasm of prostate: Secondary | ICD-10-CM | POA: Diagnosis not present

## 2020-08-26 DIAGNOSIS — N529 Male erectile dysfunction, unspecified: Secondary | ICD-10-CM | POA: Diagnosis not present

## 2020-08-26 DIAGNOSIS — E039 Hypothyroidism, unspecified: Secondary | ICD-10-CM | POA: Diagnosis not present

## 2020-08-26 DIAGNOSIS — E538 Deficiency of other specified B group vitamins: Secondary | ICD-10-CM

## 2020-08-26 DIAGNOSIS — K5909 Other constipation: Secondary | ICD-10-CM | POA: Diagnosis not present

## 2020-08-26 DIAGNOSIS — Z0001 Encounter for general adult medical examination with abnormal findings: Secondary | ICD-10-CM | POA: Diagnosis not present

## 2020-08-26 DIAGNOSIS — Z13220 Encounter for screening for lipoid disorders: Secondary | ICD-10-CM | POA: Diagnosis not present

## 2020-08-26 LAB — LIPID PANEL
Cholesterol: 169 mg/dL (ref 0–200)
HDL: 60.7 mg/dL (ref >39.00)
LDL Cholesterol: 86 mg/dL (ref 0–99)
NonHDL: 108.22
Total CHOL/HDL Ratio: 3
Triglycerides: 111 mg/dL (ref 0.0–149.0)
VLDL: 22.2 mg/dL (ref 0.0–40.0)

## 2020-08-26 LAB — CBC
HCT: 42.6 % (ref 39.0–52.0)
Hemoglobin: 14.5 g/dL (ref 13.0–17.0)
MCHC: 34 g/dL (ref 30.0–36.0)
MCV: 87.8 fl (ref 78.0–100.0)
Platelets: 233 10*3/uL (ref 150.0–400.0)
RBC: 4.85 Mil/uL (ref 4.22–5.81)
RDW: 13.7 % (ref 11.5–15.5)
WBC: 6.7 10*3/uL (ref 4.0–10.5)

## 2020-08-26 LAB — COMPREHENSIVE METABOLIC PANEL
ALT: 16 U/L (ref 0–53)
AST: 22 U/L (ref 0–37)
Albumin: 4.2 g/dL (ref 3.5–5.2)
Alkaline Phosphatase: 74 U/L (ref 39–117)
BUN: 23 mg/dL (ref 6–23)
CO2: 26 mEq/L (ref 19–32)
Calcium: 9.4 mg/dL (ref 8.4–10.5)
Chloride: 101 mEq/L (ref 96–112)
Creatinine, Ser: 1.01 mg/dL (ref 0.40–1.50)
GFR: 70.07 mL/min (ref >60.00)
Glucose, Bld: 98 mg/dL (ref 70–99)
Potassium: 4.9 mEq/L (ref 3.5–5.1)
Sodium: 134 mEq/L — ABNORMAL LOW (ref 135–145)
Total Bilirubin: 0.5 mg/dL (ref 0.2–1.2)
Total Protein: 6.5 g/dL (ref 6.0–8.3)

## 2020-08-26 LAB — HEMOGLOBIN A1C: Hgb A1c MFr Bld: 5.7 % (ref 4.6–6.5)

## 2020-08-26 LAB — VITAMIN B12: Vitamin B-12: 791 pg/mL (ref 211–911)

## 2020-08-26 LAB — TSH: TSH: 1.88 u[IU]/mL (ref 0.35–4.50)

## 2020-08-26 MED ORDER — LEVOTHYROXINE SODIUM 50 MCG PO TABS: 50.0000 ug | ORAL_TABLET | Freq: Every day | ORAL | 2 refills | Status: DC

## 2020-08-26 NOTE — Assessment & Plan Note (Signed)
Is taking B12 1000 units 3 times weekly.  Check B12 level today.

## 2020-08-26 NOTE — Assessment & Plan Note (Signed)
Continue Synthroid 50 mcg daily.  Will check thyroid ultrasound he does have some mild thyromegaly on exam.  Check TSH today as well.

## 2020-08-26 NOTE — Patient Instructions (Signed)
It was very nice to see you today!  We will check blood work.  We will get you set up to have an ultrasound of your thyroid.  I will refill your medications today.  Please come in to see me in 80 year for your next physical.  Come back to see me sooner if needed.  Take care, Dr Jerline Pain  PLEASE NOTE:  If you had any lab tests please let us know if you have not heard back within a few days. You may see your results on mychart before we have a chance to review them but we will give you a call once they are reviewed by [place]. If we ordered any referrals today, please let us know if you have not heard from their office within the next week.   Please try these tips to maintain a healthy lifestyle:   Eat at least 3 REAL meals and 1-2 snacks per day.  Aim for no more than 5 hours between eating.  If you eat breakfast, please do so within one hour of getting up.    Each meal should contain half fruits/vegetables, one quarter protein, and one quarter carbs (no bigger than a computer mouse)   Cut down on sweet beverages. This includes juice, soda, and sweet tea.     Drink at least 1 glass of water with each meal and aim for at least 8 glasses per day   Exercise at least 150 minutes every week.    Preventive Care 81 Years and Older, Male Preventive care refers to lifestyle choices and visits with your health care provider that can promote health and wellness. This includes:  A yearly physical exam. This is also called an annual wellness visit.  Regular dental and eye exams.  Immunizations.  Screening for certain conditions.  Healthy lifestyle choices, such as: ? Eating a healthy diet. ? Getting regular exercise. ? Not using drugs or products that contain nicotine and tobacco. ? Limiting alcohol use. What can I expect for my preventive care visit? Physical exam Your health care provider will check your:  Height and weight. These may be used to calculate your BMI (body mass index).  BMI is a measurement that tells if you are at a healthy weight.  Heart rate and blood pressure.  Body temperature.  Skin for abnormal spots. Counseling Your health care provider may ask you questions about your:  Past medical problems.  Family's medical history.  Alcohol, tobacco, and drug use.  Emotional well-being.  Home life and relationship well-being.  Sexual activity.  Diet, exercise, and sleep habits.  History of falls.  Memory and ability to understand (cognition).  Work and work Statistician.  Access to firearms. What immunizations do I need? Vaccines are usually given at various ages, according to a schedule. Your health care provider will recommend vaccines for you based on your age, medical history, and lifestyle or other factors, such as travel or where you work.   What tests do I need? Blood tests  Lipid and cholesterol levels. These may be checked every 5 years, or more often depending on your overall health.  Hepatitis C test.  Hepatitis B test. Screening  Lung cancer screening. You may have this screening every year starting at age 81 if you have a 30-pack-year history of smoking and currently smoke or have quit within the past 15 years.  Colorectal cancer screening. ? All adults should have this screening starting at age 81 and continuing until age 47. ?  Your health care provider may recommend screening at age 81 if you are at increased risk. ? You will have tests every 81-10 years, depending on your results and the type of screening test.  Prostate cancer screening. Recommendations will vary depending on your family history and other risks.  Genital exam to check for testicular cancer or hernias.  Diabetes screening. ? This is done by checking your blood sugar (glucose) after you have not eaten for a while (fasting). ? You may have this done every 1-3 years.  Abdominal aortic aneurysm (AAA) screening. You may need this if you are a current or  former smoker.  STD (sexually transmitted disease) testing, if you are at risk. Follow these instructions at home: Eating and drinking  Eat a diet that includes fresh fruits and vegetables, whole grains, lean protein, and low-fat dairy products. Limit your intake of foods with high amounts of sugar, saturated fats, and salt.  Take vitamin and mineral supplements as recommended by your health care provider.  Do not drink alcohol if your health care provider tells you not to drink.  If you drink alcohol: ? Limit how much you have to 0-2 drinks a day. ? Be aware of how much alcohol is in your drink. In the [place] one drink equals one 12 oz bottle of beer (355 mL), one 5 oz glass of wine (148 mL), or one 1 oz glass of hard liquor (44 mL).   Lifestyle  Take daily care of your teeth and gums. Brush your teeth every morning and night with fluoride toothpaste. Floss one time each day.  Stay active. Exercise for at least 30 minutes 5 or more days each week.  Do not use any products that contain nicotine or tobacco, such as cigarettes, e-cigarettes, and chewing tobacco. If you need help quitting, ask your health care provider.  Do not use drugs.  If you are sexually active, practice safe sex. Use a condom or other form of protection to prevent STIs (sexually transmitted infections).  Talk with your health care provider about taking a low-dose aspirin or statin.  Find healthy ways to cope with stress, such as: ? Meditation, yoga, or listening to music. ? Journaling. ? Talking to a trusted person. ? Spending time with friends and family. Safety  Always wear your seat belt while driving or riding in a vehicle.  Do not drive: ? If you have been drinking alcohol. Do not ride with someone who has been drinking. ? When you are tired or distracted. ? While texting.  Wear a helmet and other protective equipment during sports activities.  If you have firearms in your house, make sure you  follow all gun safety procedures. What's next?  Visit your health care provider once a year for an annual wellness visit.  Ask your health care provider how often you should have your eyes and teeth checked.  Stay up to date on all vaccines. This information is not intended to replace advice given to you by your health care provider. Make sure you discuss any questions you have with your health care provider. Document Revised: 02/10/2019 Document Reviewed: 05/08/2018 Elsevier Patient Education  2021 [employer].

## 2020-08-26 NOTE — Assessment & Plan Note (Signed)
Follows with urology

## 2020-08-26 NOTE — Assessment & Plan Note (Signed)
Doing well.  Uses MiraLAX as needed.  Occasionally uses enema.  Has not had any issues with impaction recently.

## 2020-08-26 NOTE — Assessment & Plan Note (Signed)
Stable on Viagra 50 mg daily as needed.

## 2020-08-26 NOTE — Progress Notes (Signed)
Chief Complaint:  Jesse [name] is a 80 y.o. male who presents today for his annual comprehensive physical exam.    Assessment/Plan:  Chronic Problems Addressed Today: Hypothyroidism Continue Synthroid 50 mcg daily.  Will check thyroid ultrasound he does have some mild thyromegaly on exam.  Check TSH today as well.  Vitamin B12 deficiency Is taking B12 1000 units 3 times weekly.  Check B12 level today.  Erectile dysfunction Stable on Viagra 50 mg daily as needed.  Chronic constipation Doing well.  Uses MiraLAX as needed.  Occasionally uses enema.  Has not had any issues with impaction recently.  Prostate cancer [facility]) Follows with urology.  Preventative Healthcare: Check labs.  Up-to-date on vaccines and screenings.  Patient Counseling(The following topics were reviewed and/or handout was given):  -Nutrition: Stressed importance of moderation in sodium/caffeine intake, saturated fat and cholesterol, caloric balance, sufficient intake of fresh fruits, vegetables, and fiber.  -Stressed the importance of regular exercise.   -Substance Abuse: Discussed cessation/primary prevention of tobacco, alcohol, or other drug use; driving or other dangerous activities under the influence; availability of treatment for abuse.   -Injury prevention: Discussed safety belts, safety helmets, smoke detector, smoking near bedding or upholstery.   -Sexuality: Discussed sexually transmitted diseases, partner selection, use of condoms, avoidance of unintended pregnancy and contraceptive alternatives.   -Dental health: Discussed importance of regular tooth brushing, flossing, and dental visits.  -Health maintenance and immunizations reviewed. Please refer to Health maintenance section.  Return to care in 1 year for next preventative visit.     Subjective:  HPI:  He has no acute complaints today.  See A/P for status of chronic conditions.  Lifestyle Diet: Balanced. Exercise: Goes to gym  regularly.  Depression screen [facility] 2/9 08/26/2020  Decreased Interest 0  Down, Depressed, Hopeless 0  PHQ - 2 Score 0  Altered sleeping 0  Tired, decreased energy 0  Change in appetite 0  Feeling bad or failure about yourself  0  Trouble concentrating 0  Moving slowly or fidgety/restless 0  Suicidal thoughts 0  PHQ-9 Score 0  Difficult doing work/chores -   ROS: Per HPI, otherwise a complete review of systems was negative.   PMH:  The following were reviewed and entered/updated in epic: Past Medical History:  Diagnosis Date  . Arthritis   . Atrial fibrillation ([place])   . Complication of anesthesia    Atrial fibrillation during surgery   . Dementia ([place])   . Depression   . Hard of hearing   . Hx of constipation   . Osteoporosis    DEXA 11/09/10. Completed treatemtn with Fosamax.  . Prostate cancer ([place])   . Restless legs   . Thyroid disease    hypothyroidism  . Vitiligo    Patient Active Problem List   Diagnosis Date Noted  . Vitamin B12 deficiency 08/26/2020  . Restless legs 07/22/2019  . History of skin cancer 01/12/2019  . Erectile dysfunction 01/12/2019  . Anxiety 01/12/2019  . Volvulus of sigmoid colon s/p flex sig/rectal tube 09/27/2017 09/27/2017  . Mild dementia ([place]) 09/27/2017  . Chronic constipation 09/27/2017  . Osteoarthritis of right hip 06/05/2016  . Prostate cancer ([place]) 12/07/2015  . Hypothyroidism 12/07/2015   Past Surgical History:  Procedure Laterality Date  . BACK SURGERY    . COLONOSCOPY    . FLEXIBLE SIGMOIDOSCOPY N/A 09/27/2017   Procedure: FLEXIBLE SIGMOIDOSCOPY;  Surgeon: Gatha Mayer, MD;  Location: [facility] ENDOSCOPY;  Service: Endoscopy;  Laterality: N/A;  .  JOINT REPLACEMENT    . LAMINECTOMY  10/2011   cervical and lumbar laminectomy  . PROSTATE BIOPSY  07/10/2011  . PROSTATE BIOPSY  09/22/15  . TOTAL HIP ARTHROPLASTY Right 02/29/2012  . TOTAL HIP ARTHROPLASTY Right 07/04/2016   Procedure: RIGHT TOTAL HIP ARTHROPLASTY ANTERIOR APPROACH;   Surgeon: Rod Can, MD;  Location: [facility];  Service: Orthopedics;  Laterality: Right;  . TRANSURETHRAL RESECTION OF PROSTATE      Family History  Problem Relation Age of Onset  . Atrial fibrillation Mother   . Cystic fibrosis Mother   . Atrial fibrillation Sister   . Cancer Neg Hx   . Esophageal cancer Neg Hx   . Colon cancer Neg Hx   . Rectal cancer Neg Hx   . Stomach cancer Neg Hx     Medications- reviewed and updated Current Outpatient Medications  Medication Sig Dispense Refill  . clotrimazole-betamethasone (LOTRISONE) cream APPLY CREAM TOPICALLY TO AFFECTED AREA ONCE DAILY AS NEEDED    . Cyanocobalamin (VITAMIN B 12 PO) Take 1,000 Units by mouth 3 (three) times a week.     . magnesium hydroxide (MILK OF MAGNESIA) 400 MG/5ML suspension Take by mouth daily as needed for mild constipation.    . Multiple Vitamins-Minerals (MULTIVITAMIN ADULT PO) Take 1 tablet by mouth daily. One a Day    . nystatin cream (MYCOSTATIN) APPLY CREAM TOPICALLY TWICE DAILY    . polyethylene glycol powder (MIRALAX) 17 GM/SCOOP powder as needed.     . sildenafil (VIAGRA) 50 MG tablet Take 50 mg by mouth as needed.     Marland Kitchen levothyroxine (SYNTHROID) 50 MCG tablet Take 1 tablet (50 mcg total) by mouth daily before breakfast. 90 tablet 2   No current facility-administered medications for this visit.    Allergies-reviewed and updated Allergies  Allergen Reactions  . Other     Social History   Socioeconomic History  . Marital status: Married    Spouse name: Not on file  . Number of children: Not on file  . Years of education: Not on file  . Highest education level: Not on file  Occupational History  . Not on file  Tobacco Use  . Smoking status: Former Smoker    Types: Cigarettes    Quit date: 05/28/1974    Years since quitting: 46.2  . Smokeless tobacco: Never Used  . Tobacco comment: smoked very little   Vaping Use  . Vaping Use: Never used  Substance and Sexual Activity  . Alcohol use:  Yes    Comment: 4-5 standard drinks per week/ stopped drinkin   . Drug use: No  . Sexual activity: Yes  Other Topics Concern  . Not on file  Social History Narrative  . Not on file   Social Determinants of Health   Financial Resource Strain: Not on file  Food Insecurity: Not on file  Transportation Needs: Not on file  Physical Activity: Not on file  Stress: Not on file  Social Connections: Not on file        Objective:  Physical Exam: BP (!) 90/53   Pulse 69   Temp 98.1 F (36.7 C) (Temporal)   Ht 5\' 11"  (1.803 m)   Wt 162 lb (73.5 kg)   SpO2 96%   BMI 22.59 kg/m   Body mass index is 22.59 kg/m. Wt Readings from Last 3 Encounters:  08/26/20 162 lb (73.5 kg)  02/19/20 164 lb (74.4 kg)  01/19/20 164 lb 3.2 oz (74.5 kg)   Gen: NAD,  resting comfortably HEENT: TMs normal bilaterally. OP clear. No thyromegaly noted.  CV: RRR with no murmurs appreciated Pulm: NWOB, CTAB with no crackles, wheezes, or rhonchi GI: Normal bowel sounds present. Soft, Nontender, Nondistended. MSK: no edema, cyanosis, or clubbing noted Skin: warm, dry Neuro: CN2-12 grossly intact. Strength 5/5 in upper and lower extremities. Reflexes symmetric and intact bilaterally.  Psych: Normal affect and thought content     Tyquisha Sharps M. Jerline Pain, MD 08/26/2020 2:20 PM

## 2020-08-29 NOTE — Progress Notes (Signed)
Please inform patient of the following:  Blood work is all stable. We will contact him with results of ultrasound once we get them back. Do not need to make any changes to his treatment plan at this time.  Algis Greenhouse. Jerline Pain, MD 08/29/2020 8:27 AM

## 2020-09-12 ENCOUNTER — Ambulatory Visit
Admission: RE | Admit: 2020-09-12 | Discharge: 2020-09-12 | Disposition: A | Discharge status: Home / Self Care | Payer: Medicare Other | Source: Ambulatory Visit | Attending: Family Medicine | Admitting: Family Medicine

## 2020-09-12 DIAGNOSIS — E010 Iodine-deficiency related diffuse (endemic) goiter: Secondary | ICD-10-CM | POA: Diagnosis not present

## 2020-09-12 DIAGNOSIS — E0500 Thyrotoxicosis with diffuse goiter without thyrotoxic crisis or storm: Secondary | ICD-10-CM | POA: Diagnosis not present

## 2020-09-12 DIAGNOSIS — E039 Hypothyroidism, unspecified: Secondary | ICD-10-CM

## 2020-09-13 NOTE — Progress Notes (Signed)
Please inform patient of the following:  Thyroid ultrasound is normal.  Jesse [name] M. Jerline Pain, MD 09/13/2020 9:06 AM

## 2020-09-20 ENCOUNTER — Ambulatory Visit: Payer: Medicare Other | Attending: Internal Medicine

## 2020-09-20 DIAGNOSIS — Z20822 Contact with and (suspected) exposure to covid-19: Secondary | ICD-10-CM | POA: Diagnosis not present

## 2020-09-21 LAB — SARS-COV-2, NAA 2 DAY TAT

## 2020-09-21 LAB — NOVEL CORONAVIRUS, NAA: SARS-CoV-2, NAA: NOT DETECTED

## 2020-10-04 DIAGNOSIS — Z8546 Personal history of malignant neoplasm of prostate: Secondary | ICD-10-CM | POA: Diagnosis not present

## 2020-10-14 DIAGNOSIS — C61 Malignant neoplasm of prostate: Secondary | ICD-10-CM | POA: Diagnosis not present

## 2020-10-14 DIAGNOSIS — R351 Nocturia: Secondary | ICD-10-CM | POA: Diagnosis not present

## 2020-10-18 ENCOUNTER — Encounter: Payer: Self-pay | Admitting: Family Medicine

## 2020-10-18 DIAGNOSIS — Z23 Encounter for immunization: Secondary | ICD-10-CM | POA: Diagnosis not present

## 2020-10-19 NOTE — Telephone Encounter (Signed)
See note

## 2020-10-21 ENCOUNTER — Other Ambulatory Visit: Payer: Self-pay

## 2020-10-21 ENCOUNTER — Ambulatory Visit (INDEPENDENT_AMBULATORY_CARE_PROVIDER_SITE_OTHER): Payer: Medicare Other | Admitting: Podiatry

## 2020-10-21 ENCOUNTER — Encounter: Payer: Self-pay | Admitting: Podiatry

## 2020-10-21 DIAGNOSIS — B351 Tinea unguium: Secondary | ICD-10-CM

## 2020-10-21 DIAGNOSIS — M79675 Pain in left toe(s): Secondary | ICD-10-CM

## 2020-10-21 DIAGNOSIS — M79674 Pain in right toe(s): Secondary | ICD-10-CM | POA: Diagnosis not present

## 2020-10-21 NOTE — Progress Notes (Signed)
This patient returns to the office for evaluation and treatment of long thick painful nails .  This patient is unable to trim his own nails since the patient cannot reach his feet.  Patient says the nails are painful walking and wearing his shoes. He presents to the office with is wife.  He returns for preventive foot care services.  General Appearance  Alert, conversant and in no acute stress.  Vascular  Dorsalis pedis and posterior tibial  pulses are palpable  bilaterally.  Capillary return is within normal limits  bilaterally. Temperature is within normal limits  Bilaterally.  Absent digital hair.  Neurologic  Senn-Weinstein monofilament wire test within normal limits  bilaterally. Muscle power within normal limits bilaterally.  Nails Thick disfigured discolored nails with subungual debris  from hallux to fifth toes bilaterally. No evidence of bacterial infection or drainage bilaterally.  Orthopedic  No limitations of motion  feet .  No crepitus or effusions noted.  No bony pathology or digital deformities noted.  Skin  normotropic skin with no porokeratosis noted bilaterally.  No signs of infections or ulcers noted.     Onychomycosis  Pain in toes right foot  Pain in toes left foot  Debridement  of nails  1-5  B/L with a nail nipper.  Nails were then filed using a dremel tool with no incidents.    RTC  9 weeks   Kynadi Dragos DPM  

## 2020-11-08 NOTE — Telephone Encounter (Signed)
Coding has been updated.  DOS has been resubmitted.  Patient has been notified.

## 2020-12-27 ENCOUNTER — Ambulatory Visit: Payer: Medicare Other | Admitting: Podiatry

## 2021-01-11 ENCOUNTER — Other Ambulatory Visit: Payer: Self-pay

## 2021-01-11 ENCOUNTER — Ambulatory Visit (INDEPENDENT_AMBULATORY_CARE_PROVIDER_SITE_OTHER): Payer: Medicare Other | Admitting: Podiatry

## 2021-01-11 ENCOUNTER — Encounter: Payer: Self-pay | Admitting: Podiatry

## 2021-01-11 DIAGNOSIS — M79675 Pain in left toe(s): Secondary | ICD-10-CM

## 2021-01-11 DIAGNOSIS — B351 Tinea unguium: Secondary | ICD-10-CM

## 2021-01-11 DIAGNOSIS — M79674 Pain in right toe(s): Secondary | ICD-10-CM

## 2021-01-11 NOTE — Progress Notes (Signed)
This patient returns to the office for evaluation and treatment of long thick painful nails .  This patient is unable to trim his own nails since the patient cannot reach his feet.  Patient says the nails are painful walking and wearing his shoes. He presents to the office with is wife.  He returns for preventive foot care services.  General Appearance  Alert, conversant and in no acute stress.  Vascular  Dorsalis pedis and posterior tibial  pulses are palpable  bilaterally.  Capillary return is within normal limits  bilaterally. Temperature is within normal limits  Bilaterally.  Absent digital hair.  Neurologic  Senn-Weinstein monofilament wire test within normal limits  bilaterally. Muscle power within normal limits bilaterally.  Nails Thick disfigured discolored nails with subungual debris  from hallux to fifth toes bilaterally. No evidence of bacterial infection or drainage bilaterally.  Orthopedic  No limitations of motion  feet .  No crepitus or effusions noted.  No bony pathology or digital deformities noted.  Skin  normotropic skin with no porokeratosis noted bilaterally.  No signs of infections or ulcers noted.     Onychomycosis  Pain in toes right foot  Pain in toes left foot  Debridement  of nails  1-5  B/L with a nail nipper.  Nails were then filed using a dremel tool with no incidents.    RTC  9 weeks   Khristy Kalan DPM  

## 2021-02-08 DIAGNOSIS — H5203 Hypermetropia, bilateral: Secondary | ICD-10-CM | POA: Diagnosis not present

## 2021-02-08 DIAGNOSIS — H25813 Combined forms of age-related cataract, bilateral: Secondary | ICD-10-CM | POA: Diagnosis not present

## 2021-02-08 DIAGNOSIS — H524 Presbyopia: Secondary | ICD-10-CM | POA: Diagnosis not present

## 2021-03-03 DIAGNOSIS — Z23 Encounter for immunization: Secondary | ICD-10-CM | POA: Diagnosis not present

## 2021-03-04 ENCOUNTER — Encounter: Payer: Self-pay | Admitting: Family Medicine

## 2021-04-03 ENCOUNTER — Encounter: Payer: Self-pay | Admitting: Podiatry

## 2021-04-03 ENCOUNTER — Ambulatory Visit (INDEPENDENT_AMBULATORY_CARE_PROVIDER_SITE_OTHER): Payer: Medicare Other | Admitting: Podiatry

## 2021-04-03 ENCOUNTER — Other Ambulatory Visit: Payer: Self-pay

## 2021-04-03 DIAGNOSIS — M79675 Pain in left toe(s): Secondary | ICD-10-CM

## 2021-04-03 DIAGNOSIS — M79674 Pain in right toe(s): Secondary | ICD-10-CM | POA: Diagnosis not present

## 2021-04-03 DIAGNOSIS — B351 Tinea unguium: Secondary | ICD-10-CM

## 2021-04-03 NOTE — Progress Notes (Signed)
This patient returns to the office for evaluation and treatment of long thick painful nails .  This patient is unable to trim his own nails since the patient cannot reach his feet.  Patient says the nails are painful walking and wearing his shoes. He presents to the office with is wife.  He returns for preventive foot care services.  General Appearance  Alert, conversant and in no acute stress.  Vascular  Dorsalis pedis and posterior tibial  pulses are palpable  bilaterally.  Capillary return is within normal limits  bilaterally. Temperature is within normal limits  Bilaterally.  Absent digital hair.  Neurologic  Senn-Weinstein monofilament wire test within normal limits  bilaterally. Muscle power within normal limits bilaterally.  Nails Thick disfigured discolored nails with subungual debris  from hallux to fifth toes bilaterally. No evidence of bacterial infection or drainage bilaterally.  Orthopedic  No limitations of motion  feet .  No crepitus or effusions noted.  No bony pathology or digital deformities noted.  Skin  normotropic skin with no porokeratosis noted bilaterally.  No signs of infections or ulcers noted.     Onychomycosis  Pain in toes right foot  Pain in toes left foot  Debridement  of nails  1-5  B/L with a nail nipper.  Nails were then filed using a dremel tool with no incidents.    RTC  9 weeks   Emi Lymon DPM  

## 2021-05-01 ENCOUNTER — Other Ambulatory Visit: Payer: Self-pay | Admitting: Family Medicine

## 2021-05-01 DIAGNOSIS — E039 Hypothyroidism, unspecified: Secondary | ICD-10-CM

## 2021-06-05 ENCOUNTER — Other Ambulatory Visit: Payer: Self-pay

## 2021-06-05 ENCOUNTER — Ambulatory Visit (INDEPENDENT_AMBULATORY_CARE_PROVIDER_SITE_OTHER): Payer: Medicare Other | Admitting: Podiatry

## 2021-06-05 ENCOUNTER — Encounter: Payer: Self-pay | Admitting: Podiatry

## 2021-06-05 DIAGNOSIS — M79675 Pain in left toe(s): Secondary | ICD-10-CM

## 2021-06-05 DIAGNOSIS — B351 Tinea unguium: Secondary | ICD-10-CM

## 2021-06-05 DIAGNOSIS — M79674 Pain in right toe(s): Secondary | ICD-10-CM

## 2021-06-05 NOTE — Progress Notes (Signed)
This patient returns to the office for evaluation and treatment of long thick painful nails .  This patient is unable to trim his own nails since the patient cannot reach his feet.  Patient says the nails are painful walking and wearing his shoes. He presents to the office with is wife.  He returns for preventive foot care services.  General Appearance  Alert, conversant and in no acute stress.  Vascular  Dorsalis pedis and posterior tibial  pulses are palpable  bilaterally.  Capillary return is within normal limits  bilaterally. Temperature is within normal limits  Bilaterally.  Absent digital hair.  Neurologic  Senn-Weinstein monofilament wire test within normal limits  bilaterally. Muscle power within normal limits bilaterally.  Nails Thick disfigured discolored nails with subungual debris  from hallux to fifth toes bilaterally. No evidence of bacterial infection or drainage bilaterally.  Orthopedic  No limitations of motion  feet .  No crepitus or effusions noted.  No bony pathology or digital deformities noted.  Skin  normotropic skin with no porokeratosis noted bilaterally.  No signs of infections or ulcers noted.     Onychomycosis  Pain in toes right foot  Pain in toes left foot  Debridement  of nails  1-5  B/L with a nail nipper.  Nails were then filed using a dremel tool with no incidents.    RTC  9 weeks   Chisum Habenicht DPM  

## 2021-07-18 ENCOUNTER — Other Ambulatory Visit: Payer: Self-pay | Admitting: Family Medicine

## 2021-07-18 DIAGNOSIS — E039 Hypothyroidism, unspecified: Secondary | ICD-10-CM

## 2021-08-08 ENCOUNTER — Encounter: Payer: Self-pay | Admitting: Podiatry

## 2021-08-08 ENCOUNTER — Ambulatory Visit (INDEPENDENT_AMBULATORY_CARE_PROVIDER_SITE_OTHER): Payer: Medicare Other | Admitting: Podiatry

## 2021-08-08 ENCOUNTER — Other Ambulatory Visit: Payer: Self-pay

## 2021-08-08 DIAGNOSIS — M79674 Pain in right toe(s): Secondary | ICD-10-CM

## 2021-08-08 DIAGNOSIS — B351 Tinea unguium: Secondary | ICD-10-CM

## 2021-08-08 DIAGNOSIS — M79675 Pain in left toe(s): Secondary | ICD-10-CM | POA: Diagnosis not present

## 2021-08-08 NOTE — Progress Notes (Signed)
This patient returns to the office for evaluation and treatment of long thick painful nails .  This patient is unable to trim his own nails since the patient cannot reach his feet.  Patient says the nails are painful walking and wearing his shoes. He presents to the office with is wife.  He returns for preventive foot care services.  General Appearance  Alert, conversant and in no acute stress.  Vascular  Dorsalis pedis and posterior tibial  pulses are palpable  bilaterally.  Capillary return is within normal limits  bilaterally. Temperature is within normal limits  Bilaterally.  Absent digital hair.  Neurologic  Senn-Weinstein monofilament wire test within normal limits  bilaterally. Muscle power within normal limits bilaterally.  Nails Thick disfigured discolored nails with subungual debris  from hallux to fifth toes bilaterally. No evidence of bacterial infection or drainage bilaterally.  Orthopedic  No limitations of motion  feet .  No crepitus or effusions noted.  No bony pathology or digital deformities noted.  Skin  normotropic skin with no porokeratosis noted bilaterally.  No signs of infections or ulcers noted.     Onychomycosis  Pain in toes right foot  Pain in toes left foot  Debridement  of nails  1-5  B/L with a nail nipper.  Nails were then filed using a dremel tool with no incidents.    RTC  9 weeks   Elaiza Shoberg DPM  

## 2021-10-10 ENCOUNTER — Encounter: Payer: Self-pay | Admitting: Podiatry

## 2021-10-10 ENCOUNTER — Ambulatory Visit (INDEPENDENT_AMBULATORY_CARE_PROVIDER_SITE_OTHER): Payer: Medicare Other | Admitting: Podiatry

## 2021-10-10 DIAGNOSIS — M79675 Pain in left toe(s): Secondary | ICD-10-CM

## 2021-10-10 DIAGNOSIS — B351 Tinea unguium: Secondary | ICD-10-CM | POA: Diagnosis not present

## 2021-10-10 DIAGNOSIS — M79674 Pain in right toe(s): Secondary | ICD-10-CM | POA: Diagnosis not present

## 2021-10-10 NOTE — Progress Notes (Signed)
This patient returns to the office for evaluation and treatment of long thick painful nails .  This patient is unable to trim his own nails since the patient cannot reach his feet.  Patient says the nails are painful walking and wearing his shoes. He presents to the office with is wife.  He returns for preventive foot care services.  General Appearance  Alert, conversant and in no acute stress.  Vascular  Dorsalis pedis and posterior tibial  pulses are palpable  bilaterally.  Capillary return is within normal limits  bilaterally. Temperature is within normal limits  Bilaterally.  Absent digital hair.  Neurologic  Senn-Weinstein monofilament wire test within normal limits  bilaterally. Muscle power within normal limits bilaterally.  Nails Thick disfigured discolored nails with subungual debris  from hallux to fifth toes bilaterally. No evidence of bacterial infection or drainage bilaterally.  Orthopedic  No limitations of motion  feet .  No crepitus or effusions noted.  No bony pathology or digital deformities noted.  Skin  normotropic skin with no porokeratosis noted bilaterally.  No signs of infections or ulcers noted.     Onychomycosis  Pain in toes right foot  Pain in toes left foot  Debridement  of nails  1-5  B/L with a nail nipper.  Nails were then filed using a dremel tool with no incidents.    RTC  9 weeks   Athene Schuhmacher DPM  

## 2021-10-16 ENCOUNTER — Ambulatory Visit: Payer: Medicare Other | Admitting: Podiatry

## 2021-12-13 ENCOUNTER — Ambulatory Visit (INDEPENDENT_AMBULATORY_CARE_PROVIDER_SITE_OTHER): Payer: Medicare Other | Admitting: Podiatry

## 2021-12-13 ENCOUNTER — Encounter: Payer: Self-pay | Admitting: Podiatry

## 2021-12-13 DIAGNOSIS — M79674 Pain in right toe(s): Secondary | ICD-10-CM | POA: Diagnosis not present

## 2021-12-13 DIAGNOSIS — M79675 Pain in left toe(s): Secondary | ICD-10-CM | POA: Diagnosis not present

## 2021-12-13 DIAGNOSIS — B351 Tinea unguium: Secondary | ICD-10-CM

## 2021-12-13 NOTE — Progress Notes (Signed)
This patient returns to the office for evaluation and treatment of long thick painful nails .  This patient is unable to trim his own nails since the patient cannot reach his feet.  Patient says the nails are painful walking and wearing his shoes. He presents to the office with is wife.  He returns for preventive foot care services.  General Appearance  Alert, conversant and in no acute stress.  Vascular  Dorsalis pedis and posterior tibial  pulses are palpable  bilaterally.  Capillary return is within normal limits  bilaterally. Temperature is within normal limits  Bilaterally.  Absent digital hair.  Neurologic  Senn-Weinstein monofilament wire test within normal limits  bilaterally. Muscle power within normal limits bilaterally.  Nails Thick disfigured discolored nails with subungual debris  from hallux to fifth toes bilaterally. No evidence of bacterial infection or drainage bilaterally.  Orthopedic  No limitations of motion  feet .  No crepitus or effusions noted.  No bony pathology or digital deformities noted.  Skin  normotropic skin with no porokeratosis noted bilaterally.  No signs of infections or ulcers noted.     Onychomycosis  Pain in toes right foot  Pain in toes left foot  Debridement  of nails  1-5  B/L with a nail nipper.  Nails were then filed using a dremel tool with no incidents.    RTC  9 weeks   Jayra Choyce DPM  

## 2022-02-13 DIAGNOSIS — E538 Deficiency of other specified B group vitamins: Secondary | ICD-10-CM | POA: Diagnosis not present

## 2022-02-13 DIAGNOSIS — Z8546 Personal history of malignant neoplasm of prostate: Secondary | ICD-10-CM | POA: Diagnosis not present

## 2022-02-13 DIAGNOSIS — Z23 Encounter for immunization: Secondary | ICD-10-CM | POA: Diagnosis not present

## 2022-02-13 DIAGNOSIS — E039 Hypothyroidism, unspecified: Secondary | ICD-10-CM | POA: Diagnosis not present

## 2022-02-14 ENCOUNTER — Encounter: Payer: Self-pay | Admitting: Podiatry

## 2022-02-14 ENCOUNTER — Ambulatory Visit (INDEPENDENT_AMBULATORY_CARE_PROVIDER_SITE_OTHER): Payer: Medicare Other | Admitting: Podiatry

## 2022-02-14 DIAGNOSIS — M79674 Pain in right toe(s): Secondary | ICD-10-CM

## 2022-02-14 DIAGNOSIS — B351 Tinea unguium: Secondary | ICD-10-CM | POA: Diagnosis not present

## 2022-02-14 DIAGNOSIS — M79675 Pain in left toe(s): Secondary | ICD-10-CM

## 2022-02-14 DIAGNOSIS — Z23 Encounter for immunization: Secondary | ICD-10-CM | POA: Diagnosis not present

## 2022-02-14 NOTE — Progress Notes (Signed)
This patient returns to the office for evaluation and treatment of long thick painful nails .  This patient is unable to trim his own nails since the patient cannot reach his feet.  Patient says the nails are painful walking and wearing his shoes. He presents to the office with is wife.  He returns for preventive foot care services.  General Appearance  Alert, conversant and in no acute stress.  Vascular  Dorsalis pedis and posterior tibial  pulses are palpable  bilaterally.  Capillary return is within normal limits  bilaterally. Temperature is within normal limits  Bilaterally.  Absent digital hair.  Neurologic  Senn-Weinstein monofilament wire test within normal limits  bilaterally. Muscle power within normal limits bilaterally.  Nails Thick disfigured discolored nails with subungual debris  from hallux to fifth toes bilaterally. No evidence of bacterial infection or drainage bilaterally.  Orthopedic  No limitations of motion  feet .  No crepitus or effusions noted.  No bony pathology or digital deformities noted.  Skin  normotropic skin with no porokeratosis noted bilaterally.  No signs of infections or ulcers noted.     Onychomycosis  Pain in toes right foot  Pain in toes left foot  Debridement  of nails  1-5  B/L with a nail nipper.  Nails were then filed using a dremel tool with no incidents.    RTC  9 weeks   Whisper Kurka DPM  

## 2022-03-12 DIAGNOSIS — E039 Hypothyroidism, unspecified: Secondary | ICD-10-CM | POA: Diagnosis not present

## 2022-03-12 DIAGNOSIS — Z0000 Encounter for general adult medical examination without abnormal findings: Secondary | ICD-10-CM | POA: Diagnosis not present

## 2022-04-24 ENCOUNTER — Ambulatory Visit: Payer: Medicare Other | Admitting: Podiatry

## 2022-04-25 ENCOUNTER — Ambulatory Visit (INDEPENDENT_AMBULATORY_CARE_PROVIDER_SITE_OTHER): Payer: Medicare Other | Admitting: Podiatry

## 2022-04-25 ENCOUNTER — Encounter: Payer: Self-pay | Admitting: Podiatry

## 2022-04-25 DIAGNOSIS — M79674 Pain in right toe(s): Secondary | ICD-10-CM | POA: Diagnosis not present

## 2022-04-25 DIAGNOSIS — M79675 Pain in left toe(s): Secondary | ICD-10-CM | POA: Diagnosis not present

## 2022-04-25 DIAGNOSIS — B351 Tinea unguium: Secondary | ICD-10-CM

## 2022-04-25 NOTE — Progress Notes (Signed)
This patient returns to the office for evaluation and treatment of long thick painful nails .  This patient is unable to trim his own nails since the patient cannot reach his feet.  Patient says the nails are painful walking and wearing his shoes. He presents to the office with is wife.  He returns for preventive foot care services.  General Appearance  Alert, conversant and in no acute stress.  Vascular  Dorsalis pedis and posterior tibial  pulses are palpable  bilaterally.  Capillary return is within normal limits  bilaterally. Temperature is within normal limits  Bilaterally.  Absent digital hair.  Neurologic  Senn-Weinstein monofilament wire test within normal limits  bilaterally. Muscle power within normal limits bilaterally.  Nails Thick disfigured discolored nails with subungual debris  from hallux to fifth toes bilaterally. No evidence of bacterial infection or drainage bilaterally.  Orthopedic  No limitations of motion  feet .  No crepitus or effusions noted.  No bony pathology or digital deformities noted.  Skin  normotropic skin with no porokeratosis noted bilaterally.  No signs of infections or ulcers noted.     Onychomycosis  Pain in toes right foot  Pain in toes left foot  Debridement  of nails  1-5  B/L with a nail nipper.  Nails were then filed using a dremel tool with no incidents.    RTC  9 weeks   Gardiner Barefoot DPM

## 2022-05-07 DIAGNOSIS — Z96641 Presence of right artificial hip joint: Secondary | ICD-10-CM | POA: Diagnosis not present

## 2022-05-07 DIAGNOSIS — M545 Low back pain, unspecified: Secondary | ICD-10-CM | POA: Diagnosis not present

## 2022-05-16 DIAGNOSIS — Z8546 Personal history of malignant neoplasm of prostate: Secondary | ICD-10-CM | POA: Diagnosis not present

## 2022-05-16 DIAGNOSIS — M50322 Other cervical disc degeneration at C5-C6 level: Secondary | ICD-10-CM | POA: Diagnosis not present

## 2022-05-16 DIAGNOSIS — M47816 Spondylosis without myelopathy or radiculopathy, lumbar region: Secondary | ICD-10-CM | POA: Diagnosis not present

## 2022-05-16 DIAGNOSIS — K5989 Other specified functional intestinal disorders: Secondary | ICD-10-CM | POA: Diagnosis not present

## 2022-05-16 DIAGNOSIS — M50321 Other cervical disc degeneration at C4-C5 level: Secondary | ICD-10-CM | POA: Diagnosis not present

## 2022-05-16 DIAGNOSIS — R339 Retention of urine, unspecified: Secondary | ICD-10-CM | POA: Diagnosis not present

## 2022-05-16 DIAGNOSIS — K5981 Ogilvie syndrome: Secondary | ICD-10-CM | POA: Diagnosis present

## 2022-05-16 DIAGNOSIS — Z981 Arthrodesis status: Secondary | ICD-10-CM | POA: Diagnosis not present

## 2022-05-16 DIAGNOSIS — K560 Paralytic ileus: Secondary | ICD-10-CM | POA: Diagnosis present

## 2022-05-16 DIAGNOSIS — Z8679 Personal history of other diseases of the circulatory system: Secondary | ICD-10-CM | POA: Diagnosis not present

## 2022-05-16 DIAGNOSIS — Z79890 Hormone replacement therapy (postmenopausal): Secondary | ICD-10-CM | POA: Diagnosis not present

## 2022-05-16 DIAGNOSIS — Z9049 Acquired absence of other specified parts of digestive tract: Secondary | ICD-10-CM | POA: Diagnosis not present

## 2022-05-16 DIAGNOSIS — Z95828 Presence of other vascular implants and grafts: Secondary | ICD-10-CM | POA: Diagnosis not present

## 2022-05-16 DIAGNOSIS — E871 Hypo-osmolality and hyponatremia: Secondary | ICD-10-CM | POA: Diagnosis present

## 2022-05-16 DIAGNOSIS — M5126 Other intervertebral disc displacement, lumbar region: Secondary | ICD-10-CM | POA: Diagnosis not present

## 2022-05-16 DIAGNOSIS — Z96649 Presence of unspecified artificial hip joint: Secondary | ICD-10-CM | POA: Diagnosis present

## 2022-05-16 DIAGNOSIS — M4316 Spondylolisthesis, lumbar region: Secondary | ICD-10-CM | POA: Diagnosis not present

## 2022-05-16 DIAGNOSIS — E039 Hypothyroidism, unspecified: Secondary | ICD-10-CM | POA: Diagnosis present

## 2022-05-16 DIAGNOSIS — M47817 Spondylosis without myelopathy or radiculopathy, lumbosacral region: Secondary | ICD-10-CM | POA: Diagnosis not present

## 2022-05-16 DIAGNOSIS — Z7409 Other reduced mobility: Secondary | ICD-10-CM | POA: Diagnosis present

## 2022-05-16 DIAGNOSIS — K56609 Unspecified intestinal obstruction, unspecified as to partial versus complete obstruction: Secondary | ICD-10-CM | POA: Diagnosis not present

## 2022-05-16 DIAGNOSIS — K5900 Constipation, unspecified: Secondary | ICD-10-CM | POA: Diagnosis not present

## 2022-05-16 DIAGNOSIS — R140 Abdominal distension (gaseous): Secondary | ICD-10-CM | POA: Diagnosis not present

## 2022-05-16 DIAGNOSIS — Z4682 Encounter for fitting and adjustment of non-vascular catheter: Secondary | ICD-10-CM | POA: Diagnosis not present

## 2022-05-16 DIAGNOSIS — M4312 Spondylolisthesis, cervical region: Secondary | ICD-10-CM | POA: Diagnosis not present

## 2022-05-16 DIAGNOSIS — M4800 Spinal stenosis, site unspecified: Secondary | ICD-10-CM | POA: Diagnosis present

## 2022-05-16 DIAGNOSIS — M5031 Other cervical disc degeneration,  high cervical region: Secondary | ICD-10-CM | POA: Diagnosis not present

## 2022-05-16 DIAGNOSIS — Z978 Presence of other specified devices: Secondary | ICD-10-CM | POA: Diagnosis not present

## 2022-05-16 DIAGNOSIS — M5184 Other intervertebral disc disorders, thoracic region: Secondary | ICD-10-CM | POA: Diagnosis not present

## 2022-05-16 DIAGNOSIS — E860 Dehydration: Secondary | ICD-10-CM | POA: Diagnosis present

## 2022-05-16 DIAGNOSIS — K567 Ileus, unspecified: Secondary | ICD-10-CM | POA: Diagnosis not present

## 2022-05-16 DIAGNOSIS — K6389 Other specified diseases of intestine: Secondary | ICD-10-CM | POA: Diagnosis not present

## 2022-05-16 DIAGNOSIS — M5124 Other intervertebral disc displacement, thoracic region: Secondary | ICD-10-CM | POA: Diagnosis not present

## 2022-05-16 DIAGNOSIS — M899 Disorder of bone, unspecified: Secondary | ICD-10-CM | POA: Diagnosis not present

## 2022-05-16 DIAGNOSIS — K769 Liver disease, unspecified: Secondary | ICD-10-CM | POA: Diagnosis not present

## 2022-05-16 DIAGNOSIS — R531 Weakness: Secondary | ICD-10-CM | POA: Diagnosis present

## 2022-05-16 DIAGNOSIS — Z8739 Personal history of other diseases of the musculoskeletal system and connective tissue: Secondary | ICD-10-CM | POA: Diagnosis not present

## 2022-05-17 DIAGNOSIS — M899 Disorder of bone, unspecified: Secondary | ICD-10-CM | POA: Diagnosis not present

## 2022-05-17 DIAGNOSIS — M5031 Other cervical disc degeneration,  high cervical region: Secondary | ICD-10-CM | POA: Diagnosis not present

## 2022-05-17 DIAGNOSIS — Z96649 Presence of unspecified artificial hip joint: Secondary | ICD-10-CM | POA: Diagnosis present

## 2022-05-17 DIAGNOSIS — M4800 Spinal stenosis, site unspecified: Secondary | ICD-10-CM | POA: Diagnosis present

## 2022-05-17 DIAGNOSIS — Z8739 Personal history of other diseases of the musculoskeletal system and connective tissue: Secondary | ICD-10-CM | POA: Diagnosis not present

## 2022-05-17 DIAGNOSIS — K5900 Constipation, unspecified: Secondary | ICD-10-CM | POA: Diagnosis not present

## 2022-05-17 DIAGNOSIS — K560 Paralytic ileus: Secondary | ICD-10-CM | POA: Diagnosis present

## 2022-05-17 DIAGNOSIS — K6389 Other specified diseases of intestine: Secondary | ICD-10-CM | POA: Diagnosis not present

## 2022-05-17 DIAGNOSIS — Z8679 Personal history of other diseases of the circulatory system: Secondary | ICD-10-CM | POA: Diagnosis not present

## 2022-05-17 DIAGNOSIS — E860 Dehydration: Secondary | ICD-10-CM | POA: Diagnosis present

## 2022-05-17 DIAGNOSIS — M5126 Other intervertebral disc displacement, lumbar region: Secondary | ICD-10-CM | POA: Diagnosis not present

## 2022-05-17 DIAGNOSIS — Z7409 Other reduced mobility: Secondary | ICD-10-CM | POA: Diagnosis present

## 2022-05-17 DIAGNOSIS — R339 Retention of urine, unspecified: Secondary | ICD-10-CM | POA: Diagnosis present

## 2022-05-17 DIAGNOSIS — Z978 Presence of other specified devices: Secondary | ICD-10-CM | POA: Diagnosis not present

## 2022-05-17 DIAGNOSIS — M5124 Other intervertebral disc displacement, thoracic region: Secondary | ICD-10-CM | POA: Diagnosis not present

## 2022-05-17 DIAGNOSIS — E871 Hypo-osmolality and hyponatremia: Secondary | ICD-10-CM | POA: Diagnosis present

## 2022-05-17 DIAGNOSIS — Z79890 Hormone replacement therapy (postmenopausal): Secondary | ICD-10-CM | POA: Diagnosis not present

## 2022-05-17 DIAGNOSIS — Z8546 Personal history of malignant neoplasm of prostate: Secondary | ICD-10-CM | POA: Diagnosis not present

## 2022-05-17 DIAGNOSIS — E039 Hypothyroidism, unspecified: Secondary | ICD-10-CM | POA: Diagnosis present

## 2022-05-17 DIAGNOSIS — M5184 Other intervertebral disc disorders, thoracic region: Secondary | ICD-10-CM | POA: Diagnosis not present

## 2022-05-17 DIAGNOSIS — M4316 Spondylolisthesis, lumbar region: Secondary | ICD-10-CM | POA: Diagnosis not present

## 2022-05-17 DIAGNOSIS — R140 Abdominal distension (gaseous): Secondary | ICD-10-CM | POA: Diagnosis not present

## 2022-05-17 DIAGNOSIS — M47816 Spondylosis without myelopathy or radiculopathy, lumbar region: Secondary | ICD-10-CM | POA: Diagnosis not present

## 2022-05-17 DIAGNOSIS — R531 Weakness: Secondary | ICD-10-CM | POA: Diagnosis present

## 2022-05-17 DIAGNOSIS — Z4682 Encounter for fitting and adjustment of non-vascular catheter: Secondary | ICD-10-CM | POA: Diagnosis not present

## 2022-05-17 DIAGNOSIS — Z9049 Acquired absence of other specified parts of digestive tract: Secondary | ICD-10-CM | POA: Diagnosis not present

## 2022-05-17 DIAGNOSIS — M4312 Spondylolisthesis, cervical region: Secondary | ICD-10-CM | POA: Diagnosis not present

## 2022-05-17 DIAGNOSIS — M47817 Spondylosis without myelopathy or radiculopathy, lumbosacral region: Secondary | ICD-10-CM | POA: Diagnosis not present

## 2022-05-17 DIAGNOSIS — K5989 Other specified functional intestinal disorders: Secondary | ICD-10-CM | POA: Diagnosis not present

## 2022-05-17 DIAGNOSIS — Z981 Arthrodesis status: Secondary | ICD-10-CM | POA: Diagnosis not present

## 2022-05-17 DIAGNOSIS — Z95828 Presence of other vascular implants and grafts: Secondary | ICD-10-CM | POA: Diagnosis not present

## 2022-05-17 DIAGNOSIS — K5981 Ogilvie syndrome: Secondary | ICD-10-CM | POA: Diagnosis present

## 2022-05-17 DIAGNOSIS — M50321 Other cervical disc degeneration at C4-C5 level: Secondary | ICD-10-CM | POA: Diagnosis not present

## 2022-05-17 DIAGNOSIS — M50322 Other cervical disc degeneration at C5-C6 level: Secondary | ICD-10-CM | POA: Diagnosis not present

## 2022-05-17 DIAGNOSIS — K567 Ileus, unspecified: Secondary | ICD-10-CM | POA: Diagnosis not present

## 2022-05-23 ENCOUNTER — Telehealth: Payer: Self-pay

## 2022-05-23 DIAGNOSIS — R937 Abnormal findings on diagnostic imaging of other parts of musculoskeletal system: Secondary | ICD-10-CM | POA: Diagnosis not present

## 2022-05-23 NOTE — Patient Outreach (Signed)
  Care Coordination TOC Note Transition Care Management Follow-up Telephone Call Date of discharge and from where: 05/21/22-[facility] Dx: "Colonic pseudo obstruction" How have you been since you were released from the hospital? Spoke with spouse who requested that call be completed with her. Discussed purpose of call. Spouse voices that patient no longer sees Dr. Jerline Pain at [facility]. They have switched to [facility]. She voices that patient will make follow up appt. She states that patient is doing okay and no acute needs or concerns at this time.    Care Coordination Interventions:  Education provided    Encounter Outcome:  Pt. Visit Completed    Enzo Montgomery, RN,BSN,CCM [facility] Telephonic Care Management Coordinator Direct Phone: [phone] Toll Free: [phone] Fax: [phone]

## 2022-06-05 DIAGNOSIS — N3000 Acute cystitis without hematuria: Secondary | ICD-10-CM | POA: Diagnosis not present

## 2022-06-05 DIAGNOSIS — N472 Paraphimosis: Secondary | ICD-10-CM | POA: Diagnosis not present

## 2022-06-06 DIAGNOSIS — N401 Enlarged prostate with lower urinary tract symptoms: Secondary | ICD-10-CM | POA: Diagnosis not present

## 2022-06-06 DIAGNOSIS — R3914 Feeling of incomplete bladder emptying: Secondary | ICD-10-CM | POA: Diagnosis not present

## 2022-06-07 DIAGNOSIS — K56609 Unspecified intestinal obstruction, unspecified as to partial versus complete obstruction: Secondary | ICD-10-CM | POA: Diagnosis not present

## 2022-06-07 DIAGNOSIS — Z79899 Other long term (current) drug therapy: Secondary | ICD-10-CM | POA: Diagnosis not present

## 2022-06-07 DIAGNOSIS — M1990 Unspecified osteoarthritis, unspecified site: Secondary | ICD-10-CM | POA: Diagnosis not present

## 2022-06-07 DIAGNOSIS — Z96643 Presence of artificial hip joint, bilateral: Secondary | ICD-10-CM | POA: Diagnosis not present

## 2022-06-07 DIAGNOSIS — Z8546 Personal history of malignant neoplasm of prostate: Secondary | ICD-10-CM | POA: Diagnosis not present

## 2022-06-07 DIAGNOSIS — Z436 Encounter for attention to other artificial openings of urinary tract: Secondary | ICD-10-CM | POA: Diagnosis not present

## 2022-06-07 DIAGNOSIS — Z79890 Hormone replacement therapy (postmenopausal): Secondary | ICD-10-CM | POA: Diagnosis not present

## 2022-06-07 DIAGNOSIS — E039 Hypothyroidism, unspecified: Secondary | ICD-10-CM | POA: Diagnosis not present

## 2022-06-07 DIAGNOSIS — I4891 Unspecified atrial fibrillation: Secondary | ICD-10-CM | POA: Diagnosis not present

## 2022-06-07 DIAGNOSIS — R339 Retention of urine, unspecified: Secondary | ICD-10-CM | POA: Diagnosis not present

## 2022-06-07 DIAGNOSIS — Z8719 Personal history of other diseases of the digestive system: Secondary | ICD-10-CM | POA: Diagnosis not present

## 2022-06-07 DIAGNOSIS — T83091A Other mechanical complication of indwelling urethral catheter, initial encounter: Secondary | ICD-10-CM | POA: Diagnosis not present

## 2022-06-12 DIAGNOSIS — R531 Weakness: Secondary | ICD-10-CM | POA: Diagnosis not present

## 2022-06-12 DIAGNOSIS — K56609 Unspecified intestinal obstruction, unspecified as to partial versus complete obstruction: Secondary | ICD-10-CM | POA: Diagnosis not present

## 2022-06-12 DIAGNOSIS — Z743 Need for continuous supervision: Secondary | ICD-10-CM | POA: Diagnosis not present

## 2022-06-12 DIAGNOSIS — R413 Other amnesia: Secondary | ICD-10-CM | POA: Diagnosis present

## 2022-06-12 DIAGNOSIS — K5981 Ogilvie syndrome: Secondary | ICD-10-CM | POA: Diagnosis present

## 2022-06-12 DIAGNOSIS — K567 Ileus, unspecified: Secondary | ICD-10-CM | POA: Diagnosis present

## 2022-06-12 DIAGNOSIS — R55 Syncope and collapse: Secondary | ICD-10-CM | POA: Diagnosis not present

## 2022-06-12 DIAGNOSIS — R4182 Altered mental status, unspecified: Secondary | ICD-10-CM | POA: Diagnosis not present

## 2022-06-12 DIAGNOSIS — E860 Dehydration: Secondary | ICD-10-CM | POA: Diagnosis present

## 2022-06-12 DIAGNOSIS — Z86010 Personal history of colonic polyps: Secondary | ICD-10-CM | POA: Diagnosis not present

## 2022-06-12 DIAGNOSIS — Z79890 Hormone replacement therapy (postmenopausal): Secondary | ICD-10-CM | POA: Diagnosis not present

## 2022-06-12 DIAGNOSIS — K922 Gastrointestinal hemorrhage, unspecified: Secondary | ICD-10-CM | POA: Diagnosis not present

## 2022-06-12 DIAGNOSIS — D320 Benign neoplasm of cerebral meninges: Secondary | ICD-10-CM | POA: Diagnosis present

## 2022-06-12 DIAGNOSIS — Z0389 Encounter for observation for other suspected diseases and conditions ruled out: Secondary | ICD-10-CM | POA: Diagnosis not present

## 2022-06-12 DIAGNOSIS — F32A Depression, unspecified: Secondary | ICD-10-CM | POA: Diagnosis present

## 2022-06-12 DIAGNOSIS — N472 Paraphimosis: Secondary | ICD-10-CM | POA: Diagnosis not present

## 2022-06-12 DIAGNOSIS — R5383 Other fatigue: Secondary | ICD-10-CM | POA: Diagnosis present

## 2022-06-12 DIAGNOSIS — Q438 Other specified congenital malformations of intestine: Secondary | ICD-10-CM | POA: Diagnosis not present

## 2022-06-12 DIAGNOSIS — E039 Hypothyroidism, unspecified: Secondary | ICD-10-CM | POA: Diagnosis not present

## 2022-06-12 DIAGNOSIS — I480 Paroxysmal atrial fibrillation: Secondary | ICD-10-CM | POA: Diagnosis present

## 2022-06-12 DIAGNOSIS — D329 Benign neoplasm of meninges, unspecified: Secondary | ICD-10-CM | POA: Diagnosis not present

## 2022-06-12 DIAGNOSIS — Z20822 Contact with and (suspected) exposure to covid-19: Secondary | ICD-10-CM | POA: Diagnosis not present

## 2022-06-12 DIAGNOSIS — R339 Retention of urine, unspecified: Secondary | ICD-10-CM | POA: Diagnosis not present

## 2022-06-12 DIAGNOSIS — M1990 Unspecified osteoarthritis, unspecified site: Secondary | ICD-10-CM | POA: Diagnosis present

## 2022-06-12 DIAGNOSIS — K5939 Other megacolon: Secondary | ICD-10-CM | POA: Diagnosis present

## 2022-06-12 DIAGNOSIS — Z923 Personal history of irradiation: Secondary | ICD-10-CM | POA: Diagnosis not present

## 2022-06-12 DIAGNOSIS — I959 Hypotension, unspecified: Secondary | ICD-10-CM | POA: Diagnosis not present

## 2022-06-12 DIAGNOSIS — C61 Malignant neoplasm of prostate: Secondary | ICD-10-CM | POA: Diagnosis present

## 2022-06-12 DIAGNOSIS — G935 Compression of brain: Secondary | ICD-10-CM | POA: Diagnosis present

## 2022-06-12 DIAGNOSIS — T83011A Breakdown (mechanical) of indwelling urethral catheter, initial encounter: Secondary | ICD-10-CM | POA: Diagnosis not present

## 2022-06-12 DIAGNOSIS — F419 Anxiety disorder, unspecified: Secondary | ICD-10-CM | POA: Diagnosis present

## 2022-06-12 DIAGNOSIS — M79604 Pain in right leg: Secondary | ICD-10-CM | POA: Diagnosis not present

## 2022-06-12 DIAGNOSIS — M4800 Spinal stenosis, site unspecified: Secondary | ICD-10-CM | POA: Diagnosis present

## 2022-06-12 DIAGNOSIS — Z96643 Presence of artificial hip joint, bilateral: Secondary | ICD-10-CM | POA: Diagnosis present

## 2022-06-12 DIAGNOSIS — R079 Chest pain, unspecified: Secondary | ICD-10-CM | POA: Diagnosis not present

## 2022-06-12 DIAGNOSIS — Z4659 Encounter for fitting and adjustment of other gastrointestinal appliance and device: Secondary | ICD-10-CM | POA: Diagnosis not present

## 2022-06-12 DIAGNOSIS — Z6821 Body mass index (BMI) 21.0-21.9, adult: Secondary | ICD-10-CM | POA: Diagnosis not present

## 2022-06-13 DIAGNOSIS — Z6821 Body mass index (BMI) 21.0-21.9, adult: Secondary | ICD-10-CM | POA: Diagnosis not present

## 2022-06-13 DIAGNOSIS — Z79890 Hormone replacement therapy (postmenopausal): Secondary | ICD-10-CM | POA: Diagnosis not present

## 2022-06-13 DIAGNOSIS — I480 Paroxysmal atrial fibrillation: Secondary | ICD-10-CM | POA: Diagnosis present

## 2022-06-13 DIAGNOSIS — Z86010 Personal history of colonic polyps: Secondary | ICD-10-CM | POA: Diagnosis not present

## 2022-06-13 DIAGNOSIS — Z96643 Presence of artificial hip joint, bilateral: Secondary | ICD-10-CM | POA: Diagnosis present

## 2022-06-13 DIAGNOSIS — M4800 Spinal stenosis, site unspecified: Secondary | ICD-10-CM | POA: Diagnosis present

## 2022-06-13 DIAGNOSIS — K922 Gastrointestinal hemorrhage, unspecified: Secondary | ICD-10-CM | POA: Diagnosis not present

## 2022-06-13 DIAGNOSIS — R339 Retention of urine, unspecified: Secondary | ICD-10-CM | POA: Diagnosis present

## 2022-06-13 DIAGNOSIS — Z4659 Encounter for fitting and adjustment of other gastrointestinal appliance and device: Secondary | ICD-10-CM | POA: Diagnosis not present

## 2022-06-13 DIAGNOSIS — R55 Syncope and collapse: Secondary | ICD-10-CM | POA: Diagnosis present

## 2022-06-13 DIAGNOSIS — E039 Hypothyroidism, unspecified: Secondary | ICD-10-CM | POA: Diagnosis present

## 2022-06-13 DIAGNOSIS — N472 Paraphimosis: Secondary | ICD-10-CM | POA: Diagnosis not present

## 2022-06-13 DIAGNOSIS — C61 Malignant neoplasm of prostate: Secondary | ICD-10-CM | POA: Diagnosis present

## 2022-06-13 DIAGNOSIS — F419 Anxiety disorder, unspecified: Secondary | ICD-10-CM | POA: Diagnosis present

## 2022-06-13 DIAGNOSIS — K5939 Other megacolon: Secondary | ICD-10-CM | POA: Diagnosis present

## 2022-06-13 DIAGNOSIS — I959 Hypotension, unspecified: Secondary | ICD-10-CM | POA: Diagnosis present

## 2022-06-13 DIAGNOSIS — R413 Other amnesia: Secondary | ICD-10-CM | POA: Diagnosis present

## 2022-06-13 DIAGNOSIS — R5383 Other fatigue: Secondary | ICD-10-CM | POA: Diagnosis present

## 2022-06-13 DIAGNOSIS — K567 Ileus, unspecified: Secondary | ICD-10-CM | POA: Diagnosis present

## 2022-06-13 DIAGNOSIS — F32A Depression, unspecified: Secondary | ICD-10-CM | POA: Diagnosis present

## 2022-06-13 DIAGNOSIS — K5981 Ogilvie syndrome: Secondary | ICD-10-CM | POA: Diagnosis present

## 2022-06-13 DIAGNOSIS — Q438 Other specified congenital malformations of intestine: Secondary | ICD-10-CM | POA: Diagnosis not present

## 2022-06-13 DIAGNOSIS — M1990 Unspecified osteoarthritis, unspecified site: Secondary | ICD-10-CM | POA: Diagnosis present

## 2022-06-13 DIAGNOSIS — G935 Compression of brain: Secondary | ICD-10-CM | POA: Diagnosis present

## 2022-06-13 DIAGNOSIS — Z923 Personal history of irradiation: Secondary | ICD-10-CM | POA: Diagnosis not present

## 2022-06-13 DIAGNOSIS — D329 Benign neoplasm of meninges, unspecified: Secondary | ICD-10-CM | POA: Diagnosis not present

## 2022-06-13 DIAGNOSIS — E860 Dehydration: Secondary | ICD-10-CM | POA: Diagnosis present

## 2022-06-13 DIAGNOSIS — D320 Benign neoplasm of cerebral meninges: Secondary | ICD-10-CM | POA: Diagnosis present

## 2022-06-13 DIAGNOSIS — R531 Weakness: Secondary | ICD-10-CM | POA: Diagnosis present

## 2022-06-14 DIAGNOSIS — R339 Retention of urine, unspecified: Secondary | ICD-10-CM | POA: Diagnosis not present

## 2022-06-14 DIAGNOSIS — K5981 Ogilvie syndrome: Secondary | ICD-10-CM | POA: Diagnosis not present

## 2022-06-14 DIAGNOSIS — Z6821 Body mass index (BMI) 21.0-21.9, adult: Secondary | ICD-10-CM | POA: Diagnosis not present

## 2022-06-14 DIAGNOSIS — I480 Paroxysmal atrial fibrillation: Secondary | ICD-10-CM | POA: Diagnosis not present

## 2022-06-14 DIAGNOSIS — E039 Hypothyroidism, unspecified: Secondary | ICD-10-CM | POA: Diagnosis not present

## 2022-06-14 DIAGNOSIS — C61 Malignant neoplasm of prostate: Secondary | ICD-10-CM | POA: Diagnosis not present

## 2022-06-15 DIAGNOSIS — Z6821 Body mass index (BMI) 21.0-21.9, adult: Secondary | ICD-10-CM | POA: Diagnosis not present

## 2022-06-15 DIAGNOSIS — I480 Paroxysmal atrial fibrillation: Secondary | ICD-10-CM | POA: Diagnosis not present

## 2022-06-15 DIAGNOSIS — K5981 Ogilvie syndrome: Secondary | ICD-10-CM | POA: Diagnosis not present

## 2022-06-27 ENCOUNTER — Ambulatory Visit: Payer: Medicare Other | Admitting: Podiatry

## 2022-07-04 ENCOUNTER — Ambulatory Visit (INDEPENDENT_AMBULATORY_CARE_PROVIDER_SITE_OTHER): Payer: Medicare Other | Admitting: Podiatry

## 2022-07-04 ENCOUNTER — Encounter: Payer: Self-pay | Admitting: Podiatry

## 2022-07-04 DIAGNOSIS — M79674 Pain in right toe(s): Secondary | ICD-10-CM | POA: Diagnosis not present

## 2022-07-04 DIAGNOSIS — M79675 Pain in left toe(s): Secondary | ICD-10-CM

## 2022-07-04 DIAGNOSIS — N401 Enlarged prostate with lower urinary tract symptoms: Secondary | ICD-10-CM | POA: Diagnosis not present

## 2022-07-04 DIAGNOSIS — B351 Tinea unguium: Secondary | ICD-10-CM | POA: Diagnosis not present

## 2022-07-04 DIAGNOSIS — N472 Paraphimosis: Secondary | ICD-10-CM | POA: Diagnosis not present

## 2022-07-04 DIAGNOSIS — R3914 Feeling of incomplete bladder emptying: Secondary | ICD-10-CM | POA: Diagnosis not present

## 2022-07-04 NOTE — Progress Notes (Signed)
This patient returns to the office for evaluation and treatment of long thick painful nails .  This patient is unable to trim his own nails since the patient cannot reach his feet.  Patient says the nails are painful walking and wearing his shoes. He presents to the office with is wife.  He returns for preventive foot care services.  General Appearance  Alert, conversant and in no acute stress.  Vascular  Dorsalis pedis and posterior tibial  pulses are palpable  bilaterally.  Capillary return is within normal limits  bilaterally. Temperature is within normal limits  Bilaterally.  Absent digital hair.  Neurologic  Senn-Weinstein monofilament wire test within normal limits  bilaterally. Muscle power within normal limits bilaterally.  Nails Thick disfigured discolored nails with subungual debris  from hallux to fifth toes bilaterally. No evidence of bacterial infection or drainage bilaterally.  Orthopedic  No limitations of motion  feet .  No crepitus or effusions noted.  No bony pathology or digital deformities noted.  Skin  normotropic skin with no porokeratosis noted bilaterally.  No signs of infections or ulcers noted.     Onychomycosis  Pain in toes right foot  Pain in toes left foot  Debridement  of nails  1-5  B/L with a nail nipper.  Nails were then filed using a dremel tool with no incidents.    RTC  12 weeks   Gardiner Barefoot DPM

## 2022-07-06 DIAGNOSIS — N472 Paraphimosis: Secondary | ICD-10-CM | POA: Diagnosis not present

## 2022-07-06 DIAGNOSIS — N401 Enlarged prostate with lower urinary tract symptoms: Secondary | ICD-10-CM | POA: Diagnosis not present

## 2022-07-06 DIAGNOSIS — R3914 Feeling of incomplete bladder emptying: Secondary | ICD-10-CM | POA: Diagnosis not present

## 2022-08-14 DIAGNOSIS — I480 Paroxysmal atrial fibrillation: Secondary | ICD-10-CM | POA: Diagnosis not present

## 2022-08-14 DIAGNOSIS — E039 Hypothyroidism, unspecified: Secondary | ICD-10-CM | POA: Diagnosis not present

## 2022-08-17 DIAGNOSIS — D329 Benign neoplasm of meninges, unspecified: Secondary | ICD-10-CM | POA: Diagnosis not present

## 2022-08-29 DIAGNOSIS — D329 Benign neoplasm of meninges, unspecified: Secondary | ICD-10-CM | POA: Diagnosis not present

## 2022-08-30 DIAGNOSIS — N472 Paraphimosis: Secondary | ICD-10-CM | POA: Diagnosis not present

## 2022-08-30 DIAGNOSIS — R339 Retention of urine, unspecified: Secondary | ICD-10-CM | POA: Diagnosis not present

## 2022-08-30 DIAGNOSIS — D0439 Carcinoma in situ of skin of other parts of face: Secondary | ICD-10-CM | POA: Diagnosis not present

## 2022-08-30 DIAGNOSIS — Z6822 Body mass index (BMI) 22.0-22.9, adult: Secondary | ICD-10-CM | POA: Diagnosis not present

## 2022-09-04 DIAGNOSIS — D329 Benign neoplasm of meninges, unspecified: Secondary | ICD-10-CM | POA: Diagnosis not present

## 2022-09-04 DIAGNOSIS — Z6822 Body mass index (BMI) 22.0-22.9, adult: Secondary | ICD-10-CM | POA: Diagnosis not present

## 2022-09-05 ENCOUNTER — Ambulatory Visit: Payer: Medicare Other | Admitting: Podiatry

## 2022-09-10 ENCOUNTER — Encounter: Payer: Self-pay | Admitting: Podiatry

## 2022-09-10 ENCOUNTER — Ambulatory Visit (INDEPENDENT_AMBULATORY_CARE_PROVIDER_SITE_OTHER): Payer: Medicare Other | Admitting: Podiatry

## 2022-09-10 DIAGNOSIS — M79675 Pain in left toe(s): Secondary | ICD-10-CM

## 2022-09-10 DIAGNOSIS — T148XXA Other injury of unspecified body region, initial encounter: Secondary | ICD-10-CM

## 2022-09-10 DIAGNOSIS — B351 Tinea unguium: Secondary | ICD-10-CM

## 2022-09-10 DIAGNOSIS — M79674 Pain in right toe(s): Secondary | ICD-10-CM | POA: Diagnosis not present

## 2022-09-10 NOTE — Progress Notes (Signed)
This patient returns to the office for evaluation and treatment of long thick painful nails .  This patient is unable to trim his own nails since the patient cannot reach his feet.  Patient says the nails are painful walking and wearing his shoes. He presents to the office with is wife.  He says there was drainage from under the left big toe.   He returns for preventive foot care services.  General Appearance  Alert, conversant and in no acute stress.  Vascular  Dorsalis pedis and posterior tibial  pulses are palpable  bilaterally.  Capillary return is within normal limits  bilaterally. Temperature is within normal limits  Bilaterally.  Absent digital hair.  Neurologic  Senn-Weinstein monofilament wire test within normal limits  bilaterally. Muscle power within normal limits bilaterally.  Nails Thick disfigured discolored nails with subungual debris  from hallux to fifth toes bilaterally. No evidence of bacterial infection .  Drainage from under left hallux nail.  Orthopedic  No limitations of motion  feet .  No crepitus or effusions noted.  No bony pathology or digital deformities noted.  Skin  normotropic skin with no porokeratosis noted bilaterally.  No signs of infections or ulcers noted.     Onychomycosis  Pain in toes right foot  Pain in toes left foot  Drainage left hallux.  Debridement  of nails  1-5  B/L with a nail nipper.  Nails were then filed using a dremel tool with no incidents.  Discussed drainage with this patient.  Told to peroxide this toe  BID.  Call the office if problem persists. The left hallux toenail appears to be healing.  Watch this toe.   RTC  12 weeks   Helane Gunther DPM

## 2022-09-26 ENCOUNTER — Telehealth: Payer: Self-pay | Admitting: Pharmacist

## 2022-09-26 DIAGNOSIS — E039 Hypothyroidism, unspecified: Secondary | ICD-10-CM

## 2022-09-26 NOTE — Telephone Encounter (Signed)
This patient has been identified as "high risk" and in the top 25% of risk stratification for [facility] accountable patients.  These patients were identified using a number of factors including # of hospitalizations, ED visits, HF exacerbations, elevated BP and A1c, and overall cost of care.   Referral placed for cosign by the PCP.  CMCS team to schedule once cosigned.  Quierra Silverio, PharmD Clinical Pharmacist  [place] [phone]  

## 2022-11-12 ENCOUNTER — Ambulatory Visit (INDEPENDENT_AMBULATORY_CARE_PROVIDER_SITE_OTHER): Payer: Medicare Other | Admitting: Podiatry

## 2022-11-12 ENCOUNTER — Encounter: Payer: Self-pay | Admitting: Podiatry

## 2022-11-12 DIAGNOSIS — M79675 Pain in left toe(s): Secondary | ICD-10-CM | POA: Diagnosis not present

## 2022-11-12 DIAGNOSIS — B351 Tinea unguium: Secondary | ICD-10-CM

## 2022-11-12 DIAGNOSIS — M79674 Pain in right toe(s): Secondary | ICD-10-CM | POA: Diagnosis not present

## 2022-11-12 NOTE — Progress Notes (Signed)
This patient returns to the office for evaluation and treatment of long thick painful nails .  This patient is unable to trim his own nails since the patient cannot reach his feet.  Patient says the nails are painful walking and wearing his shoes. He presents to the office with is wife.  He says there was drainage from under the left big toe.   He returns for preventive foot care services.  General Appearance  Alert, conversant and in no acute stress.  Vascular  Dorsalis pedis and posterior tibial  pulses are palpable  bilaterally.  Capillary return is within normal limits  bilaterally. Temperature is within normal limits  Bilaterally.  Absent digital hair.  Neurologic  Senn-Weinstein monofilament wire test within normal limits  bilaterally. Muscle power within normal limits bilaterally.  Nails Thick disfigured discolored nails with subungual debris  from hallux to fifth toes bilaterally. No evidence of bacterial infection .   Orthopedic  No limitations of motion  feet .  No crepitus or effusions noted.  No bony pathology or digital deformities noted.  Skin  normotropic skin with no porokeratosis noted bilaterally.  No signs of infections or ulcers noted.     Onychomycosis  Pain in toes right foot  Pain in toes left foot  Drainage left hallux.  Debridement  of nails  1-5  B/L with a nail nipper.  Nails were then filed using a dremel tool with no incidents.  .   RTC  9  weeks   Helane Gunther DPM

## 2022-11-19 DIAGNOSIS — Z96642 Presence of left artificial hip joint: Secondary | ICD-10-CM | POA: Diagnosis not present

## 2022-11-19 DIAGNOSIS — M1612 Unilateral primary osteoarthritis, left hip: Secondary | ICD-10-CM | POA: Diagnosis not present

## 2022-11-19 DIAGNOSIS — Z6822 Body mass index (BMI) 22.0-22.9, adult: Secondary | ICD-10-CM | POA: Diagnosis not present

## 2022-11-19 DIAGNOSIS — M5416 Radiculopathy, lumbar region: Secondary | ICD-10-CM | POA: Diagnosis not present

## 2022-11-20 DIAGNOSIS — D329 Benign neoplasm of meninges, unspecified: Secondary | ICD-10-CM | POA: Diagnosis not present

## 2022-11-20 DIAGNOSIS — M4807 Spinal stenosis, lumbosacral region: Secondary | ICD-10-CM | POA: Diagnosis not present

## 2022-11-20 DIAGNOSIS — F0390 Unspecified dementia without behavioral disturbance: Secondary | ICD-10-CM | POA: Diagnosis not present

## 2022-11-22 DIAGNOSIS — Z6824 Body mass index (BMI) 24.0-24.9, adult: Secondary | ICD-10-CM | POA: Diagnosis not present

## 2022-11-22 DIAGNOSIS — E039 Hypothyroidism, unspecified: Secondary | ICD-10-CM | POA: Diagnosis not present

## 2022-11-22 DIAGNOSIS — Z8719 Personal history of other diseases of the digestive system: Secondary | ICD-10-CM | POA: Diagnosis not present

## 2022-11-22 DIAGNOSIS — F32A Depression, unspecified: Secondary | ICD-10-CM | POA: Diagnosis not present

## 2022-11-22 DIAGNOSIS — I480 Paroxysmal atrial fibrillation: Secondary | ICD-10-CM | POA: Diagnosis not present

## 2022-11-22 DIAGNOSIS — M5416 Radiculopathy, lumbar region: Secondary | ICD-10-CM | POA: Diagnosis not present

## 2022-11-22 DIAGNOSIS — F419 Anxiety disorder, unspecified: Secondary | ICD-10-CM | POA: Diagnosis not present

## 2022-11-26 DIAGNOSIS — E039 Hypothyroidism, unspecified: Secondary | ICD-10-CM | POA: Diagnosis not present

## 2022-11-26 DIAGNOSIS — I480 Paroxysmal atrial fibrillation: Secondary | ICD-10-CM | POA: Diagnosis not present

## 2022-11-28 DIAGNOSIS — M5416 Radiculopathy, lumbar region: Secondary | ICD-10-CM | POA: Diagnosis not present

## 2023-01-14 ENCOUNTER — Ambulatory Visit: Payer: Medicare Other | Admitting: Podiatry

## 2023-01-21 ENCOUNTER — Encounter: Payer: Self-pay | Admitting: Podiatry

## 2023-01-21 ENCOUNTER — Ambulatory Visit (INDEPENDENT_AMBULATORY_CARE_PROVIDER_SITE_OTHER): Payer: Medicare Other | Admitting: Podiatry

## 2023-01-21 DIAGNOSIS — B351 Tinea unguium: Secondary | ICD-10-CM | POA: Diagnosis not present

## 2023-01-21 DIAGNOSIS — M79674 Pain in right toe(s): Secondary | ICD-10-CM | POA: Diagnosis not present

## 2023-01-21 DIAGNOSIS — M79675 Pain in left toe(s): Secondary | ICD-10-CM | POA: Diagnosis not present

## 2023-01-21 NOTE — Progress Notes (Signed)
This patient returns to the office for evaluation and treatment of long thick painful nails .  This patient is unable to trim his own nails since the patient cannot reach his feet.  Patient says the nails are painful walking and wearing his shoes. He presents to the office with is wife.    He returns for preventive foot care services.  General Appearance  Alert, conversant and in no acute stress.  Vascular  Dorsalis pedis and posterior tibial  pulses are palpable  bilaterally.  Capillary return is within normal limits  bilaterally. Temperature is within normal limits  Bilaterally.  Absent digital hair.  Neurologic  Senn-Weinstein monofilament wire test within normal limits  bilaterally. Muscle power within normal limits bilaterally.  Nails Thick disfigured discolored nails with subungual debris  from hallux to fifth toes bilaterally. No evidence of bacterial infection .   Orthopedic  No limitations of motion  feet .  No crepitus or effusions noted.  No bony pathology or digital deformities noted. Left hallux nail plate is loose.  Skin  normotropic skin with no porokeratosis noted bilaterally.  No signs of infections or ulcers noted.     Onychomycosis  Pain in toes right foot  Pain in toes left foot    Debridement  of nails  1-5  B/L with a nail nipper.  Nails were then filed using a dremel tool with no incidents.  .   RTC  prn   Helane Gunther DPM

## 2023-02-12 DIAGNOSIS — M4316 Spondylolisthesis, lumbar region: Secondary | ICD-10-CM | POA: Diagnosis not present

## 2023-02-12 DIAGNOSIS — M48062 Spinal stenosis, lumbar region with neurogenic claudication: Secondary | ICD-10-CM | POA: Diagnosis not present

## 2023-02-12 DIAGNOSIS — M4712 Other spondylosis with myelopathy, cervical region: Secondary | ICD-10-CM | POA: Diagnosis not present

## 2023-02-12 DIAGNOSIS — M545 Low back pain, unspecified: Secondary | ICD-10-CM | POA: Diagnosis not present

## 2023-02-20 DIAGNOSIS — R269 Unspecified abnormalities of gait and mobility: Secondary | ICD-10-CM | POA: Diagnosis not present

## 2023-02-20 DIAGNOSIS — M6281 Muscle weakness (generalized): Secondary | ICD-10-CM | POA: Diagnosis not present

## 2023-02-21 DIAGNOSIS — M6281 Muscle weakness (generalized): Secondary | ICD-10-CM | POA: Diagnosis not present

## 2023-02-21 DIAGNOSIS — R269 Unspecified abnormalities of gait and mobility: Secondary | ICD-10-CM | POA: Diagnosis not present

## 2023-02-21 DIAGNOSIS — D329 Benign neoplasm of meninges, unspecified: Secondary | ICD-10-CM | POA: Diagnosis not present

## 2023-02-22 DIAGNOSIS — R269 Unspecified abnormalities of gait and mobility: Secondary | ICD-10-CM | POA: Diagnosis not present

## 2023-02-22 DIAGNOSIS — M6281 Muscle weakness (generalized): Secondary | ICD-10-CM | POA: Diagnosis not present

## 2023-02-26 DIAGNOSIS — R269 Unspecified abnormalities of gait and mobility: Secondary | ICD-10-CM | POA: Diagnosis not present

## 2023-02-26 DIAGNOSIS — M6281 Muscle weakness (generalized): Secondary | ICD-10-CM | POA: Diagnosis not present

## 2023-02-27 DIAGNOSIS — M6281 Muscle weakness (generalized): Secondary | ICD-10-CM | POA: Diagnosis not present

## 2023-02-27 DIAGNOSIS — D329 Benign neoplasm of meninges, unspecified: Secondary | ICD-10-CM | POA: Diagnosis not present

## 2023-02-27 DIAGNOSIS — R269 Unspecified abnormalities of gait and mobility: Secondary | ICD-10-CM | POA: Diagnosis not present

## 2023-02-28 DIAGNOSIS — Z0000 Encounter for general adult medical examination without abnormal findings: Secondary | ICD-10-CM | POA: Diagnosis not present

## 2023-02-28 DIAGNOSIS — D329 Benign neoplasm of meninges, unspecified: Secondary | ICD-10-CM | POA: Diagnosis not present

## 2023-02-28 DIAGNOSIS — I480 Paroxysmal atrial fibrillation: Secondary | ICD-10-CM | POA: Diagnosis not present

## 2023-02-28 DIAGNOSIS — E039 Hypothyroidism, unspecified: Secondary | ICD-10-CM | POA: Diagnosis not present

## 2023-02-28 DIAGNOSIS — Z6823 Body mass index (BMI) 23.0-23.9, adult: Secondary | ICD-10-CM | POA: Diagnosis not present

## 2023-02-28 DIAGNOSIS — F03A0 Unspecified dementia, mild, without behavioral disturbance, psychotic disturbance, mood disturbance, and anxiety: Secondary | ICD-10-CM | POA: Diagnosis not present

## 2023-02-28 DIAGNOSIS — Z23 Encounter for immunization: Secondary | ICD-10-CM | POA: Diagnosis not present

## 2023-03-01 DIAGNOSIS — R269 Unspecified abnormalities of gait and mobility: Secondary | ICD-10-CM | POA: Diagnosis not present

## 2023-03-01 DIAGNOSIS — M6281 Muscle weakness (generalized): Secondary | ICD-10-CM | POA: Diagnosis not present

## 2023-03-04 DIAGNOSIS — R269 Unspecified abnormalities of gait and mobility: Secondary | ICD-10-CM | POA: Diagnosis not present

## 2023-03-04 DIAGNOSIS — M6281 Muscle weakness (generalized): Secondary | ICD-10-CM | POA: Diagnosis not present

## 2023-03-05 DIAGNOSIS — M6281 Muscle weakness (generalized): Secondary | ICD-10-CM | POA: Diagnosis not present

## 2023-03-05 DIAGNOSIS — R269 Unspecified abnormalities of gait and mobility: Secondary | ICD-10-CM | POA: Diagnosis not present

## 2023-03-05 DIAGNOSIS — Z6823 Body mass index (BMI) 23.0-23.9, adult: Secondary | ICD-10-CM | POA: Diagnosis not present

## 2023-03-05 DIAGNOSIS — D329 Benign neoplasm of meninges, unspecified: Secondary | ICD-10-CM | POA: Diagnosis not present

## 2023-03-06 DIAGNOSIS — M6281 Muscle weakness (generalized): Secondary | ICD-10-CM | POA: Diagnosis not present

## 2023-03-06 DIAGNOSIS — R269 Unspecified abnormalities of gait and mobility: Secondary | ICD-10-CM | POA: Diagnosis not present

## 2023-03-09 DIAGNOSIS — Z23 Encounter for immunization: Secondary | ICD-10-CM | POA: Diagnosis not present

## 2023-03-21 ENCOUNTER — Other Ambulatory Visit: Payer: Self-pay

## 2023-03-21 ENCOUNTER — Ambulatory Visit: Payer: Medicare Other | Attending: Specialist | Admitting: Physical Therapy

## 2023-03-21 DIAGNOSIS — M4712 Other spondylosis with myelopathy, cervical region: Secondary | ICD-10-CM | POA: Insufficient documentation

## 2023-03-21 DIAGNOSIS — R2681 Unsteadiness on feet: Secondary | ICD-10-CM | POA: Insufficient documentation

## 2023-03-21 DIAGNOSIS — R2689 Other abnormalities of gait and mobility: Secondary | ICD-10-CM | POA: Diagnosis not present

## 2023-03-21 NOTE — Therapy (Signed)
OUTPATIENT PHYSICAL THERAPY THORACOLUMBAR EVALUATION   Patient Name: Jesse [name] MRN: [id number] DOB:[birth year], 82 y.o., male Today's Date: 03/21/2023  END OF SESSION:  PT End of Session - 03/21/23 0940     Visit Number 1    Date for PT Re-Evaluation 05/02/23    Authorization Type MCR A/B    Progress Note Due on Visit 10    PT Start Time 0940    PT Stop Time 1022    PT Time Calculation (min) 42 min    Activity Tolerance Patient tolerated treatment well    Behavior During Therapy [facility] for tasks assessed/performed             Past Medical History:  Diagnosis Date   Arthritis    Atrial fibrillation (HCC)    Complication of anesthesia    Atrial fibrillation during surgery    Dementia (HCC)    Depression    Hard of hearing    Hx of constipation    Osteoporosis    DEXA 11/09/10. Completed treatemtn with Fosamax.   Prostate cancer (HCC)    Restless legs    Thyroid disease    hypothyroidism   Vitiligo    Past Surgical History:  Procedure Laterality Date   BACK SURGERY     COLONOSCOPY     FLEXIBLE SIGMOIDOSCOPY N/A 09/27/2017   Procedure: FLEXIBLE SIGMOIDOSCOPY;  Surgeon: Iva Boop, MD;  Location: [facility] ENDOSCOPY;  Service: Endoscopy;  Laterality: N/A;   JOINT REPLACEMENT     LAMINECTOMY  10/2011   cervical and lumbar laminectomy   PROSTATE BIOPSY  07/10/2011   PROSTATE BIOPSY  09/22/15   TOTAL HIP ARTHROPLASTY Right 02/29/2012   TOTAL HIP ARTHROPLASTY Right 07/04/2016   Procedure: RIGHT TOTAL HIP ARTHROPLASTY ANTERIOR APPROACH;  Surgeon: Samson Frederic, MD;  Location: [facility] OR;  Service: Orthopedics;  Laterality: Right;   TRANSURETHRAL RESECTION OF PROSTATE     Patient Active Problem List   Diagnosis Date Noted   Wound drainage 09/10/2022   Vitamin B12 deficiency 08/26/2020   Restless legs 07/22/2019   History of skin cancer 01/12/2019   Erectile dysfunction 01/12/2019   Anxiety 01/12/2019   Volvulus of sigmoid colon s/p flex sig/rectal tube 09/27/2017 09/27/2017    Mild dementia (HCC) 09/27/2017   Chronic constipation 09/27/2017   Osteoarthritis of right hip 06/05/2016   Prostate cancer (HCC) 12/07/2015   Hypothyroidism 12/07/2015    PCP: Ardith Dark, MD   REFERRING PROVIDER: Corky Sing, MD   REFERRING DIAG: [phone] (ICD-10-CM) - Other spondylosis with myelopathy, cervical region, lumbar stenosis with spondylolisthesis  Rationale for Evaluation and Treatment: Rehabilitation  THERAPY DIAG:  Other abnormalities of gait and mobility  Unsteadiness on feet  ONSET DATE: December 2023  SUBJECTIVE:  SUBJECTIVE STATEMENT: Patient's wife reports he has been having problems since last December. Was feeling spasms in thigh R> L. Feeling weak. Had bowel obstructions x 2 in early months of the year. Has had therapy in the recent past with good response. Tends to hurry up with walking. Furniture walks. Use to workout at [employer] and rode bike. Memory problems. Moving back to [place] as soon as they can.  Wife: [name]   PERTINENT HISTORY:  Cervical myelopathy, B THA,( Left anterior 2017/R posterior 2013) meningioma in brain (inoperable and growing)  PAIN:  Are you having pain? No  PRECAUTIONS: Fall  RED FLAGS: None   WEIGHT BEARING RESTRICTIONS: No  FALLS:  Has patient fallen in last 6 months? No  LIVING ENVIRONMENT: Lives with: lives with their spouse Lives in: House/apartment Stairs: Yes: Internal: 17 steps; on right going up Has following equipment at home: Single point cane, Walker - 4 wheeled, and Wheelchair (manual)  OCCUPATION: retired  PLOF: Independent  PATIENT GOALS: get back in shape  NEXT MD VISIT: none scheduled  OBJECTIVE:  Note: Objective measures were completed at Evaluation unless otherwise noted.  DIAGNOSTIC  FINDINGS:  BRAIN MRI IMPRESSION: 1. No specific or reversible explanation for memory loss. 2. 13 x 5 mm dural mass along the high left frontal convexity favoring meningioma. Follow-up could confirm expected stability.  PATIENT SURVEYS:  ABC scale 1390 / 1600 = 86.9 %  COGNITION: Overall cognitive status: Within functional limits for tasks assessed     SENSATION: Not tested  MUSCLE LENGTH: Marked HS tightness to less than 45 deg, B piriformis tightness, marked heel cord tightness  POSTURE: rounded shoulders, forward head, increased thoracic kyphosis, and flexed trunk   PALPATION: unremarkable  LUMBAR ROM:   AROM eval  Flexion   Extension neutral  Right lateral flexion   Left lateral flexion   Right rotation WFL  Left rotation WFL   (Blank rows = not tested)  LOWER EXTREMITY ROM:   lacks full knee extension B and ankle DF     Right eval Left eval  Hip flexion    Hip extension    Hip abduction    Hip adduction    Hip internal rotation    Hip external rotation    Knee flexion    Knee extension    Ankle dorsiflexion    Ankle plantarflexion    Ankle inversion    Ankle eversion     (Blank rows = not tested)    FUNCTIONAL TESTS:  5 times sit to stand: 17.5 sec Timed up and go (TUG): 13.6 Berg Balance Scale: 52/56  GAIT: Distance walked: 20 feet Assistive device utilized: None Level of assistance: Modified independence Comments: flat footed, forward trunk lean, decreased trunk rotation, stride and step length and narrow BOS  TODAY'S TREATMENT:  DATE:   See pt ed and HEP   PATIENT EDUCATION:  Education details: PT eval findings, anticipated POC, initial HEP, and postural awareness  Person educated: Patient and Spouse Education method: Explanation, Demonstration, and Handouts Education comprehension: verbalized understanding and  returned demonstration  HOME EXERCISE PROGRAM: Access Code: [email or web address] URL: [email or web address] Date: 03/21/2023 Prepared by: Raynelle Fanning  Exercises - Tandem Stance in Corner  - 1 x daily - 3-4 x weekly - 1 sets - 10 reps - Single Leg Stance  - 1 x daily - 3-4 x weekly - 1 sets - 10 reps - max hold  ASSESSMENT:  CLINICAL IMPRESSION: Patient is a 82 y.o. male who was seen today for physical therapy evaluation and treatment for gait and posture abnormalities secondary to myelopathy and lumbar spondylolisthesis. He denies pain and reports he is here because the doctor sent him. His wife reports that she is concerned with his gait. His gait pattern presents with little to no heel strike, forward trunk lean, decreased trunk rotation, stride and step length and narrow BOS. His BERG indicates he is at lower risk for falls (25%) and he is challenged mainly by higher level balance like tandem stance and SLS. His TUG and 5XSTS are outside the norms for his age and indicate he is at risk for falls. He will benefit from skilled PT to address these deficits.    OBJECTIVE IMPAIRMENTS: Abnormal gait, decreased balance, decreased ROM, decreased strength, impaired flexibility, and postural dysfunction.   ACTIVITY LIMITATIONS: squatting and locomotion level  PARTICIPATION LIMITATIONS: shopping  PERSONAL FACTORS: Age and 3+ comorbidities: Cervical myelopathy, B THA, brain meningioma  are also affecting patient's functional outcome.   REHAB POTENTIAL: Good  CLINICAL DECISION MAKING: Evolving/moderate complexity  EVALUATION COMPLEXITY: Moderate   GOALS: Goals reviewed with patient? Yes  SHORT TERM GOALS: Target date: 04/11/2023   Patient will be independent with initial HEP. Baseline:  Goal status: INITIAL  2.  Patient will demonstrate decreased fall risk by scoring < 11 sec on TUG. Baseline: 13.6 sec Goal status: INITIAL  3.  Patient will be educated on strategies to decrease risk  of falls.  Baseline:  Goal status: INITIAL   LONG TERM GOALS: Target date: 05/02/2023  Patient will be independent with advanced/ongoing HEP to improve outcomes and carryover.  Baseline:  Goal status: INITIAL  2.  Patient will be able to ambulate 600' with LRAD with good safety to access community.  Baseline:  Goal status: INITIAL  3.  Patient will demonstrate improved functional LE strength as demonstrated by improved 5XSTS to 14.8 sec. Baseline: 17.5 sec Goal status: INITIAL  4.  Patient will demonstrate at least 19/24 on DGI to improve gait stability and reduce risk for falls. Baseline:  Goal status: INITIAL  5.  Patient will score 56/56 on Berg Balance test to demonstrate lower risk of falls.  Baseline: 52/56 Goal status: INITIAL  8.  Patient will demonstrate gait speed of >/= 1.8 ft/sec (0.55 m/s) to be a safe limited community ambulator with decreased risk for recurrent falls.  Baseline: TBD Goal status: INITIAL   PLAN:  PT FREQUENCY: 3x/week  PT DURATION: 6 weeks  PLANNED INTERVENTIONS: 97110-Therapeutic exercises, 97530- Therapeutic activity, 97112- Neuromuscular re-education, 97535- Self Care, 16109- Manual therapy, [phone]- Gait training, [phone]- Aquatic Therapy, Patient/Family education, Balance training, Dry Needling, Joint mobilization, Spinal mobilization, Cryotherapy, and Moist heat.  PLAN FOR NEXT SESSION: Check gait speed, 6 MWT, DGI. Higher level balance, postural strengthening, PWR moves. Ankle, HS flexibility. Gait.  Solon Palm, PT  03/21/2023, 5:38 PM

## 2023-03-22 NOTE — Therapy (Signed)
OUTPATIENT PHYSICAL THERAPY THORACOLUMBAR TREATMENT   Patient Name: Jesse [name] MRN: [id number] DOB:21-Feb-1941, 82 y.o., male Today's Date: 03/25/2023  END OF SESSION:  PT End of Session - 03/25/23 1059     Visit Number 2    Date for PT Re-Evaluation 05/02/23    PT Start Time 1059    PT Stop Time 1145    PT Time Calculation (min) 46 min    Activity Tolerance Patient tolerated treatment well    Behavior During Therapy [facility] for tasks assessed/performed              Past Medical History:  Diagnosis Date   Arthritis    Atrial fibrillation (HCC)    Complication of anesthesia    Atrial fibrillation during surgery    Dementia (HCC)    Depression    Hard of hearing    Hx of constipation    Osteoporosis    DEXA 11/09/10. Completed treatemtn with Fosamax.   Prostate cancer (HCC)    Restless legs    Thyroid disease    hypothyroidism   Vitiligo    Past Surgical History:  Procedure Laterality Date   BACK SURGERY     COLONOSCOPY     FLEXIBLE SIGMOIDOSCOPY N/A 09/27/2017   Procedure: FLEXIBLE SIGMOIDOSCOPY;  Surgeon: Iva Boop, MD;  Location: [facility] ENDOSCOPY;  Service: Endoscopy;  Laterality: N/A;   JOINT REPLACEMENT     LAMINECTOMY  10/2011   cervical and lumbar laminectomy   PROSTATE BIOPSY  07/10/2011   PROSTATE BIOPSY  09/22/15   TOTAL HIP ARTHROPLASTY Right 02/29/2012   TOTAL HIP ARTHROPLASTY Right 07/04/2016   Procedure: RIGHT TOTAL HIP ARTHROPLASTY ANTERIOR APPROACH;  Surgeon: Samson Frederic, MD;  Location: [facility] OR;  Service: Orthopedics;  Laterality: Right;   TRANSURETHRAL RESECTION OF PROSTATE     Patient Active Problem List   Diagnosis Date Noted   Wound drainage 09/10/2022   Vitamin B12 deficiency 08/26/2020   Restless legs 07/22/2019   History of skin cancer 01/12/2019   Erectile dysfunction 01/12/2019   Anxiety 01/12/2019   Volvulus of sigmoid colon s/p flex sig/rectal tube 09/27/2017 09/27/2017   Mild dementia (HCC) 09/27/2017   Chronic constipation  09/27/2017   Osteoarthritis of right hip 06/05/2016   Prostate cancer (HCC) 12/07/2015   Hypothyroidism 12/07/2015    PCP: Ardith Dark, MD   REFERRING PROVIDER: Ardith Dark, MD   REFERRING DIAG: [phone] (ICD-10-CM) - Other spondylosis with myelopathy, cervical region, lumbar stenosis with spondylolisthesis  Rationale for Evaluation and Treatment: Rehabilitation  THERAPY DIAG:  Other abnormalities of gait and mobility  Unsteadiness on feet  ONSET DATE: December 2023  SUBJECTIVE:  SUBJECTIVE STATEMENT: No new complaints  Wife: [name]   PERTINENT HISTORY:  Cervical myelopathy, B THA,( Left anterior 2017/R posterior 2013) meningioma in brain (inoperable and growing)  PAIN:  Are you having pain? No  PRECAUTIONS: Fall  RED FLAGS: None   WEIGHT BEARING RESTRICTIONS: No  FALLS:  Has patient fallen in last 6 months? No  LIVING ENVIRONMENT: Lives with: lives with their spouse Lives in: House/apartment Stairs: Yes: Internal: 17 steps; on right going up Has following equipment at home: Single point cane, Walker - 4 wheeled, and Wheelchair (manual)  OCCUPATION: retired  PLOF: Independent  PATIENT GOALS: get back in shape  NEXT MD VISIT: none scheduled  OBJECTIVE:  Note: Objective measures were completed at Evaluation unless otherwise noted.  DIAGNOSTIC FINDINGS:  BRAIN MRI IMPRESSION: 1. No specific or reversible explanation for memory loss. 2. 13 x 5 mm dural mass along the high left frontal convexity favoring meningioma. Follow-up could confirm expected stability.  PATIENT SURVEYS:  ABC scale 1390 / 1600 = 86.9 %  COGNITION: Overall cognitive status: Within functional limits for tasks assessed     SENSATION: Not tested  MUSCLE LENGTH: Marked HS tightness to  less than 45 deg, B piriformis tightness, marked heel cord tightness  POSTURE: rounded shoulders, forward head, increased thoracic kyphosis, and flexed trunk   PALPATION: unremarkable  LUMBAR ROM:   AROM eval  Flexion   Extension neutral  Right lateral flexion   Left lateral flexion   Right rotation WFL  Left rotation WFL   (Blank rows = not tested)  LOWER EXTREMITY ROM:   lacks full knee extension B and ankle DF     Right eval Left eval  Hip flexion    Hip extension    Hip abduction    Hip adduction    Hip internal rotation    Hip external rotation    Knee flexion    Knee extension    Ankle dorsiflexion    Ankle plantarflexion    Ankle inversion    Ankle eversion     (Blank rows = not tested)    FUNCTIONAL TESTS:  5 times sit to stand: 17.5 sec Timed up and go (TUG): 13.6 Berg Balance Scale: 52/56 03/25/23 6 MWT 710 ft DGI 24/24  GAIT: Distance walked: 20 feet Assistive device utilized: None Level of assistance: Modified independence Comments: flat footed, forward trunk lean, decreased trunk rotation, stride and step length and narrow BOS  TODAY'S TREATMENT:                                                                                                                              DATE:   03/25/23 6 MWT 710 ft DGI 24/24 Seated toe raises x 5 Seated heel raises x 10 Standing heel raises x 10 Standing toe raises x 10 B Standing gastroc stretch 2x30 sec ea Seated HS stretch foot on 4 inch step 2x30 sec B Seated LAQ for active HS  stretch x 10 ea Seated trunk ext x 10 and rotation and diagonals x 5 B Tandem stance x 30 sec bil ( add head turns next visit) SLS B 2 reps max hold  03/21/23 See pt ed and HEP   PATIENT EDUCATION:  Education details: PT eval findings, anticipated POC, initial HEP, and postural awareness  Person educated: Patient and Spouse Education method: Explanation, Demonstration, and Handouts Education comprehension: verbalized  understanding and returned demonstration  HOME EXERCISE PROGRAM: Access Code: [email or web address] URL: [email or web address] Date: 03/25/2023 Prepared by: Raynelle Fanning  Exercises - Tandem Stance in Corner  - 1 x daily - 3-4 x weekly - 1 sets - 10 reps - Single Leg Stance  - 1 x daily - 3-4 x weekly - 1 sets - 10 reps - max hold - Gastroc Stretch on Wall  - 2 x daily - 7 x weekly - 1 sets - 2 reps - 30 hold - Seated Thoracic Extension Arms Overhead  - 1 x daily - 7 x weekly - 2 sets - 5 reps - 2-3 sec hold - Seated Trunk Rotation  - 1 x daily - 7 x weekly - 2 sets - 5 reps - 2-3 sec hold - Seated Diagonal Chops with Medicine Ball  - 1 x daily - 7 x weekly - 2 sets - 5 reps - 2-3 hold - Seated Long Arc Quad  - 2 x daily - 7 x weekly - 1-2 sets - 10 reps  ASSESSMENT:  CLINICAL IMPRESSION: Greg scored 24/24 on the DGI and walked 710 ft on the . He demonstrates good balance overall, but his gait pattern puts him at risk mainly due to decreased heel strike. We worked on [employer] and toe raises today and began postural exercises. He tolerated everything very well. His wife wants him to join [employer], so plan to assess use of bike and machines over the next few visits to determine appropriateness.    Eval: Patient is a 82 y.o. male who was seen today for physical therapy evaluation and treatment for gait and posture abnormalities secondary to myelopathy and lumbar spondylolisthesis. He denies pain and reports he is here because the doctor sent him. His wife reports that she is concerned with his gait. His gait pattern presents with little to no heel strike, forward trunk lean, decreased trunk rotation, stride and step length and narrow BOS. His BERG indicates he is at lower risk for falls (25%) and he is challenged mainly by higher level balance like tandem stance and SLS. His TUG and 5XSTS are outside the norms for his age and indicate he is at risk for falls. He will benefit from  skilled PT to address these deficits.    OBJECTIVE IMPAIRMENTS: Abnormal gait, decreased balance, decreased ROM, decreased strength, impaired flexibility, and postural dysfunction.   ACTIVITY LIMITATIONS: squatting and locomotion level  PARTICIPATION LIMITATIONS: shopping  PERSONAL FACTORS: Age and 3+ comorbidities: Cervical myelopathy, B THA, brain meningioma  are also affecting patient's functional outcome.   REHAB POTENTIAL: Good  CLINICAL DECISION MAKING: Evolving/moderate complexity  EVALUATION COMPLEXITY: Moderate   GOALS: Goals reviewed with patient? Yes  SHORT TERM GOALS: Target date: 04/11/2023   Patient will be independent with initial HEP. Baseline:  Goal status: INITIAL  2.  Patient will demonstrate decreased fall risk by scoring < 11 sec on TUG. Baseline: 13.6 sec Goal status: INITIAL  3.  Patient will be educated on strategies to decrease risk of falls.  Baseline:  Goal status: INITIAL  LONG TERM GOALS: Target date: 05/02/2023  Patient will be independent with advanced/ongoing HEP to improve outcomes and carryover.  Baseline:  Goal status: INITIAL  2.  Patient will be able to ambulate 600' with LRAD with good safety to access community.  Baseline:  Goal status: INITIAL  3.  Patient will demonstrate improved functional LE strength as demonstrated by improved 5XSTS to 14.8 sec. Baseline: 17.5 sec Goal status: INITIAL  4.  Patient will demonstrate at least 19/24 on DGI to improve gait stability and reduce risk for falls. Baseline:  Goal status: INITIAL  5.  Patient will score 56/56 on Berg Balance test to demonstrate lower risk of falls.  Baseline: 52/56 Goal status: INITIAL  8.  Patient will demonstrate gait speed of >/= 1.8 ft/sec (0.55 m/s) to be a safe limited community ambulator with decreased risk for recurrent falls.  Baseline: TBD Goal status: INITIAL   PLAN:  PT FREQUENCY: 3x/week  PT DURATION: 6 weeks  PLANNED INTERVENTIONS:  97110-Therapeutic exercises, 97530- Therapeutic activity, 97112- Neuromuscular re-education, 97535- Self Care, 16606- Manual therapy, [phone]- Gait training, [phone]- Aquatic Therapy, Patient/Family education, Balance training, Dry Needling, Joint mobilization, Spinal mobilization, Cryotherapy, and Moist heat.  PLAN FOR NEXT SESSION: Higher level balance, postural strengthening, PWR moves. Ankle, HS flexibility. Gait.  Solon Palm, PT  03/25/2023, 1:34 PM

## 2023-03-25 ENCOUNTER — Ambulatory Visit: Payer: Medicare Other | Admitting: Physical Therapy

## 2023-03-25 ENCOUNTER — Encounter: Payer: Self-pay | Admitting: Physical Therapy

## 2023-03-25 DIAGNOSIS — R2681 Unsteadiness on feet: Secondary | ICD-10-CM | POA: Diagnosis not present

## 2023-03-25 DIAGNOSIS — R2689 Other abnormalities of gait and mobility: Secondary | ICD-10-CM | POA: Diagnosis not present

## 2023-03-25 DIAGNOSIS — M4712 Other spondylosis with myelopathy, cervical region: Secondary | ICD-10-CM | POA: Diagnosis not present

## 2023-03-27 ENCOUNTER — Ambulatory Visit: Payer: Medicare Other | Admitting: Physical Therapy

## 2023-03-27 DIAGNOSIS — R2689 Other abnormalities of gait and mobility: Secondary | ICD-10-CM

## 2023-03-27 DIAGNOSIS — M4712 Other spondylosis with myelopathy, cervical region: Secondary | ICD-10-CM | POA: Diagnosis not present

## 2023-03-27 DIAGNOSIS — R2681 Unsteadiness on feet: Secondary | ICD-10-CM

## 2023-03-27 NOTE — Therapy (Signed)
OUTPATIENT PHYSICAL THERAPY THORACOLUMBAR TREATMENT   Patient Name: Jesse [name] MRN: [id number] DOB:[birth year], 82 y.o., male Today's Date: 03/27/2023  END OF SESSION:  PT End of Session - 03/27/23 1537     Visit Number 3    Date for PT Re-Evaluation 05/02/23    Authorization Type MCR A/B    Progress Note Due on Visit 10    PT Start Time 1532    PT Stop Time 1614    PT Time Calculation (min) 42 min    Activity Tolerance Patient tolerated treatment well              Past Medical History:  Diagnosis Date   Arthritis    Atrial fibrillation (HCC)    Complication of anesthesia    Atrial fibrillation during surgery    Dementia (HCC)    Depression    Hard of hearing    Hx of constipation    Osteoporosis    DEXA 11/09/10. Completed treatemtn with Fosamax.   Prostate cancer (HCC)    Restless legs    Thyroid disease    hypothyroidism   Vitiligo    Past Surgical History:  Procedure Laterality Date   BACK SURGERY     COLONOSCOPY     FLEXIBLE SIGMOIDOSCOPY N/A 09/27/2017   Procedure: FLEXIBLE SIGMOIDOSCOPY;  Surgeon: Iva Boop, MD;  Location: [facility] ENDOSCOPY;  Service: Endoscopy;  Laterality: N/A;   JOINT REPLACEMENT     LAMINECTOMY  10/2011   cervical and lumbar laminectomy   PROSTATE BIOPSY  07/10/2011   PROSTATE BIOPSY  09/22/15   TOTAL HIP ARTHROPLASTY Right 02/29/2012   TOTAL HIP ARTHROPLASTY Right 07/04/2016   Procedure: RIGHT TOTAL HIP ARTHROPLASTY ANTERIOR APPROACH;  Surgeon: Samson Frederic, MD;  Location: [facility] OR;  Service: Orthopedics;  Laterality: Right;   TRANSURETHRAL RESECTION OF PROSTATE     Patient Active Problem List   Diagnosis Date Noted   Wound drainage 09/10/2022   Vitamin B12 deficiency 08/26/2020   Restless legs 07/22/2019   History of skin cancer 01/12/2019   Erectile dysfunction 01/12/2019   Anxiety 01/12/2019   Volvulus of sigmoid colon s/p flex sig/rectal tube 09/27/2017 09/27/2017   Mild dementia (HCC) 09/27/2017   Chronic constipation  09/27/2017   Osteoarthritis of right hip 06/05/2016   Prostate cancer (HCC) 12/07/2015   Hypothyroidism 12/07/2015    PCP: Ardith Dark, MD   REFERRING PROVIDER: Corky Sing, MD   REFERRING DIAG: [phone] (ICD-10-CM) - Other spondylosis with myelopathy, cervical region, lumbar stenosis with spondylolisthesis  Rationale for Evaluation and Treatment: Rehabilitation  THERAPY DIAG:  Other abnormalities of gait and mobility  Unsteadiness on feet  ONSET DATE: December 2023  SUBJECTIVE:  SUBJECTIVE STATEMENT: Going to [place] to look at a house to purchase.  Lots of family there.  Reports minimal soreness after last visit.   Goes by [name] Wife: [name]   PERTINENT HISTORY:  Cervical myelopathy, B THA,( Left anterior 2017/R posterior 2013) meningioma in brain (inoperable and growing)  PAIN:  Are you having pain? No  PRECAUTIONS: Fall  RED FLAGS: None   WEIGHT BEARING RESTRICTIONS: No  FALLS:  Has patient fallen in last 6 months? No  LIVING ENVIRONMENT: Lives with: lives with their spouse Lives in: House/apartment Stairs: Yes: Internal: 17 steps; on right going up Has following equipment at home: Single point cane, Walker - 4 wheeled, and Wheelchair (manual)  OCCUPATION: retired  PLOF: Independent  PATIENT GOALS: get back in shape  NEXT MD VISIT: none scheduled  OBJECTIVE:  Note: Objective measures were completed at Evaluation unless otherwise noted.  DIAGNOSTIC FINDINGS:  BRAIN MRI IMPRESSION: 1. No specific or reversible explanation for memory loss. 2. 13 x 5 mm dural mass along the high left frontal convexity favoring meningioma. Follow-up could confirm expected stability.  PATIENT SURVEYS:  ABC scale 1390 / 1600 = 86.9 %  COGNITION: Overall cognitive status: Within  functional limits for tasks assessed     SENSATION: Not tested  MUSCLE LENGTH: Marked HS tightness to less than 45 deg, B piriformis tightness, marked heel cord tightness  POSTURE: rounded shoulders, forward head, increased thoracic kyphosis, and flexed trunk   PALPATION: unremarkable  LUMBAR ROM:   AROM eval  Flexion   Extension neutral  Right lateral flexion   Left lateral flexion   Right rotation WFL  Left rotation WFL   (Blank rows = not tested)  LOWER EXTREMITY ROM:   lacks full knee extension B and ankle DF     Right eval Left eval  Hip flexion    Hip extension    Hip abduction    Hip adduction    Hip internal rotation    Hip external rotation    Knee flexion    Knee extension    Ankle dorsiflexion    Ankle plantarflexion    Ankle inversion    Ankle eversion     (Blank rows = not tested)    FUNCTIONAL TESTS:  5 times sit to stand: 17.5 sec Timed up and go (TUG): 13.6 Berg Balance Scale: 52/56 03/25/23 6 MWT 710 ft DGI 24/24  GAIT: Distance walked: 20 feet Assistive device utilized: None Level of assistance: Modified independence Comments: flat footed, forward trunk lean, decreased trunk rotation, stride and step length and narrow BOS  TODAY'S TREATMENT:                                                                                                                              DATE:   03/27/23 Standing heel raises x 10 Counter hip abduction 5x right/left Counter hip extension 5x right/left Counter top push ups 10x Back to counter trunk extension Seated  thoracic extension with ball 10x Seated trunk extension pink power cord 12x Sit to stand no UE assist 6x Dynamic reactive balance challenge: Circles on floor with toe tap, added colors on counter top for 2nd round no close supervision for safety (no UE support) Seated HS stretch foot on 8 inch stool 5 x30 sec B 2nd step hip flexor/gastroc  stretch with added UE elevation (cue for forward  gaze) Up and down 4 steps with bil handrails 3x  (some knee pain on the 3rd round) 03/25/23 6 MWT 710 ft DGI 24/24 Seated toe raises x 5 Seated heel raises x 10 Standing heel raises x 10 Standing toe raises x 10 B Standing gastroc stretch 2x30 sec ea Seated HS stretch foot on 4 inch step 2x30 sec B Seated LAQ for active HS stretch x 10 ea Seated trunk ext x 10 and rotation and diagonals x 5 B Tandem stance x 30 sec bil ( add head turns next visit) SLS B 2 reps max hold  03/21/23 See pt ed and HEP   PATIENT EDUCATION:  Education details: PT eval findings, anticipated POC, initial HEP, and postural awareness  Person educated: Patient and Spouse Education method: Explanation, Demonstration, and Handouts Education comprehension: verbalized understanding and returned demonstration  HOME EXERCISE PROGRAM: Access Code: [email or web address] URL: [email or web address] Date: 03/25/2023 Prepared by: Raynelle Fanning  Exercises - Tandem Stance in Corner  - 1 x daily - 3-4 x weekly - 1 sets - 10 reps - Single Leg Stance  - 1 x daily - 3-4 x weekly - 1 sets - 10 reps - max hold - Gastroc Stretch on Wall  - 2 x daily - 7 x weekly - 1 sets - 2 reps - 30 hold - Seated Thoracic Extension Arms Overhead  - 1 x daily - 7 x weekly - 2 sets - 5 reps - 2-3 sec hold - Seated Trunk Rotation  - 1 x daily - 7 x weekly - 2 sets - 5 reps - 2-3 sec hold - Seated Diagonal Chops with Medicine Ball  - 1 x daily - 7 x weekly - 2 sets - 5 reps - 2-3 hold - Seated Long Arc Quad  - 2 x daily - 7 x weekly - 1-2 sets - 10 reps  ASSESSMENT:  CLINICAL IMPRESSION: Therapist monitoring response throughout treatment session and providing verbal cues to optimize technique for exercise effectiveness and for upright posture.  Shortened HS, hip flexors and gastrocs noted bil.  No loss of balance with dynamic reactive balance challenge.  He does not give a number on rating of perceived exertion during or at the end of session but  reports not being overly tired.  He returns tomorrow for next visit.     Eval: Patient is a 82 y.o. male who was seen today for physical therapy evaluation and treatment for gait and posture abnormalities secondary to myelopathy and lumbar spondylolisthesis. He denies pain and reports he is here because the doctor sent him. His wife reports that she is concerned with his gait. His gait pattern presents with little to no heel strike, forward trunk lean, decreased trunk rotation, stride and step length and narrow BOS. His BERG indicates he is at lower risk for falls (25%) and he is challenged mainly by higher level balance like tandem stance and SLS. His TUG and 5XSTS are outside the norms for his age and indicate he is at risk for falls. He will benefit from skilled PT to address these deficits.  OBJECTIVE IMPAIRMENTS: Abnormal gait, decreased balance, decreased ROM, decreased strength, impaired flexibility, and postural dysfunction.   ACTIVITY LIMITATIONS: squatting and locomotion level  PARTICIPATION LIMITATIONS: shopping  PERSONAL FACTORS: Age and 3+ comorbidities: Cervical myelopathy, B THA, brain meningioma  are also affecting patient's functional outcome.   REHAB POTENTIAL: Good  CLINICAL DECISION MAKING: Evolving/moderate complexity  EVALUATION COMPLEXITY: Moderate   GOALS: Goals reviewed with patient? Yes  SHORT TERM GOALS: Target date: 04/11/2023   Patient will be independent with initial HEP. Baseline:  Goal status: INITIAL  2.  Patient will demonstrate decreased fall risk by scoring < 11 sec on TUG. Baseline: 13.6 sec Goal status: INITIAL  3.  Patient will be educated on strategies to decrease risk of falls.  Baseline:  Goal status: INITIAL   LONG TERM GOALS: Target date: 05/02/2023  Patient will be independent with advanced/ongoing HEP to improve outcomes and carryover.  Baseline:  Goal status: INITIAL  2.  Patient will be able to ambulate 600' with LRAD with  good safety to access community.  Baseline:  Goal status: INITIAL  3.  Patient will demonstrate improved functional LE strength as demonstrated by improved 5XSTS to 14.8 sec. Baseline: 17.5 sec Goal status: INITIAL  4.  Patient will demonstrate at least 19/24 on DGI to improve gait stability and reduce risk for falls. Baseline:  Goal status: INITIAL  5.  Patient will score 56/56 on Berg Balance test to demonstrate lower risk of falls.  Baseline: 52/56 Goal status: INITIAL  8.  Patient will demonstrate gait speed of >/= 1.8 ft/sec (0.55 m/s) to be a safe limited community ambulator with decreased risk for recurrent falls.  Baseline: TBD Goal status: INITIAL   PLAN:  PT FREQUENCY: 3x/week  PT DURATION: 6 weeks  PLANNED INTERVENTIONS: 97110-Therapeutic exercises, 97530- Therapeutic activity, 97112- Neuromuscular re-education, 97535- Self Care, 16109- Manual therapy, [phone]- Gait training, [phone]- Aquatic Therapy, Patient/Family education, Balance training, Dry Needling, Joint mobilization, Spinal mobilization, Cryotherapy, and Moist heat.  PLAN FOR NEXT SESSION: Higher level balance, postural strengthening, PWR moves. Ankle, HS flexibility. Gait.  Lavinia Sharps, PT 03/27/23 4:28 PM Phone: [phone] Fax: [phone]

## 2023-03-28 ENCOUNTER — Ambulatory Visit: Payer: Medicare Other | Admitting: Physical Therapy

## 2023-03-28 DIAGNOSIS — R2689 Other abnormalities of gait and mobility: Secondary | ICD-10-CM | POA: Diagnosis not present

## 2023-03-28 DIAGNOSIS — R2681 Unsteadiness on feet: Secondary | ICD-10-CM | POA: Diagnosis not present

## 2023-03-28 DIAGNOSIS — M4712 Other spondylosis with myelopathy, cervical region: Secondary | ICD-10-CM | POA: Diagnosis not present

## 2023-03-28 NOTE — Therapy (Signed)
OUTPATIENT PHYSICAL THERAPY THORACOLUMBAR TREATMENT   Patient Name: Jesse [name] MRN: [id number] DOB:[birth year], 82 y.o., male Today's Date: 03/28/2023  END OF SESSION:  PT End of Session - 03/28/23 1022     Visit Number 4    Date for PT Re-Evaluation 05/02/23    Authorization Type MCR A/B    PT Start Time 1017    PT Stop Time 1100    PT Time Calculation (min) 43 min    Activity Tolerance Patient tolerated treatment well              Past Medical History:  Diagnosis Date   Arthritis    Atrial fibrillation (HCC)    Complication of anesthesia    Atrial fibrillation during surgery    Dementia (HCC)    Depression    Hard of hearing    Hx of constipation    Osteoporosis    DEXA 11/09/10. Completed treatemtn with Fosamax.   Prostate cancer (HCC)    Restless legs    Thyroid disease    hypothyroidism   Vitiligo    Past Surgical History:  Procedure Laterality Date   BACK SURGERY     COLONOSCOPY     FLEXIBLE SIGMOIDOSCOPY N/A 09/27/2017   Procedure: FLEXIBLE SIGMOIDOSCOPY;  Surgeon: Jesse Boop, MD;  Location: [facility] ENDOSCOPY;  Service: Endoscopy;  Laterality: N/A;   JOINT REPLACEMENT     LAMINECTOMY  10/2011   cervical and lumbar laminectomy   PROSTATE BIOPSY  07/10/2011   PROSTATE BIOPSY  09/22/15   TOTAL HIP ARTHROPLASTY Right 02/29/2012   TOTAL HIP ARTHROPLASTY Right 07/04/2016   Procedure: RIGHT TOTAL HIP ARTHROPLASTY ANTERIOR APPROACH;  Surgeon: Jesse Frederic, MD;  Location: [facility] OR;  Service: Orthopedics;  Laterality: Right;   TRANSURETHRAL RESECTION OF PROSTATE     Patient Active Problem List   Diagnosis Date Noted   Wound drainage 09/10/2022   Vitamin B12 deficiency 08/26/2020   Restless legs 07/22/2019   History of skin cancer 01/12/2019   Erectile dysfunction 01/12/2019   Anxiety 01/12/2019   Volvulus of sigmoid colon s/p flex sig/rectal tube 09/27/2017 09/27/2017   Mild dementia (HCC) 09/27/2017   Chronic constipation 09/27/2017   Osteoarthritis of  right hip 06/05/2016   Prostate cancer (HCC) 12/07/2015   Hypothyroidism 12/07/2015    PCP: Jesse Dark, MD   REFERRING PROVIDER: Corky Sing, MD   REFERRING DIAG: [phone] (ICD-10-CM) - Other spondylosis with myelopathy, cervical region, lumbar stenosis with spondylolisthesis  Rationale for Evaluation and Treatment: Rehabilitation  THERAPY DIAG:  Other abnormalities of gait and mobility  Unsteadiness on feet  ONSET DATE: December 2023  SUBJECTIVE:  SUBJECTIVE STATEMENT: Leaving for [place] right after today's appt.  Reports just "a little soreness" from yesterday's session.  Goes by Jesse [name] Wife: Jesse [name]   PERTINENT HISTORY:  Cervical myelopathy, B THA,( Left anterior 2017/R posterior 2013) meningioma in brain (inoperable and growing)  PAIN:  Are you having pain? No  PRECAUTIONS: Fall  RED FLAGS: None   WEIGHT BEARING RESTRICTIONS: No  FALLS:  Has patient fallen in last 6 months? No  LIVING ENVIRONMENT: Lives with: lives with their spouse Lives in: House/apartment Stairs: Yes: Internal: 17 steps; on right going up Has following equipment at home: Single point cane, Walker - 4 wheeled, and Wheelchair (manual)  OCCUPATION: retired  PLOF: Independent  PATIENT GOALS: get back in shape  NEXT MD VISIT: none scheduled  OBJECTIVE:  Note: Objective measures were completed at Evaluation unless otherwise noted.  DIAGNOSTIC FINDINGS:  BRAIN MRI IMPRESSION: 1. No specific or reversible explanation for memory loss. 2. 13 x 5 mm dural mass along the high left frontal convexity favoring meningioma. Follow-up could confirm expected stability.  PATIENT SURVEYS:  ABC scale 1390 / 1600 = 86.9 %  COGNITION: Overall cognitive status: Within functional limits for tasks  assessed     SENSATION: Not tested  MUSCLE LENGTH: Marked HS tightness to less than 45 deg, B piriformis tightness, marked heel cord tightness  POSTURE: rounded shoulders, forward head, increased thoracic kyphosis, and flexed trunk   PALPATION: unremarkable  LUMBAR ROM:   AROM eval  Flexion   Extension neutral  Right lateral flexion   Left lateral flexion   Right rotation WFL  Left rotation WFL   (Blank rows = not tested)  LOWER EXTREMITY ROM:   lacks full knee extension B and ankle DF     Right eval Left eval  Hip flexion    Hip extension    Hip abduction    Hip adduction    Hip internal rotation    Hip external rotation    Knee flexion    Knee extension    Ankle dorsiflexion    Ankle plantarflexion    Ankle inversion    Ankle eversion     (Blank rows = not tested)    FUNCTIONAL TESTS:  5 times sit to stand: 17.5 sec Timed up and go (TUG): 13.6 Berg Balance Scale: 52/56 03/25/23 6 MWT 710 ft DGI 24/24  GAIT: Distance walked: 20 feet Assistive device utilized: None Level of assistance: Modified independence Comments: flat footed, forward trunk lean, decreased trunk rotation, stride and step length and narrow BOS  TODAY'S TREATMENT:                                                                                                                              DATE:   03/28/23 Nu-Step 5 min L5 seat 11 green machine while discussing status 2nd step hip flexor stretch with arm raise right/left cue for head up 2nd step HS stretch 5x right/left Standing heel raises  x 10 6 inch step ups with 2 railing support 10x  Standing with aqua power cord around hips with cue to drive hips forward (uses bil railing support for balance) Sit to stand holding 5# weight 5x, 2nd set with overhead press 5x 5#  Back to the wall (unable to achieve 3 points of contact):  bil UE elevation 5x, snow angels 3 sets of 5 Seated thoracic extension with ball 15x At the railing hip  abduction 5x right/left At the railing hip extension 5x right/left At the railing floor slider circles 5x right/left Push ups on the railing 10x RPE 5/10  03/27/23 Standing heel raises x 10 Counter hip abduction 5x right/left Counter hip extension 5x right/left Counter top push ups 10x Back to counter trunk extension Seated thoracic extension with ball 10x Seated trunk extension pink power cord 12x Sit to stand no UE assist 6x Dynamic reactive balance challenge: Circles on floor with toe tap, added colors on counter top for 2nd round no close supervision for safety (no UE support) Seated HS stretch foot on 8 inch stool 5 x30 sec B 2nd step hip flexor/gastroc  stretch with added UE elevation (cue for forward gaze) Up and down 4 steps with bil handrails 3x  (some knee pain on the 3rd round) 03/25/23 6 MWT 710 ft DGI 24/24 Seated toe raises x 5 Seated heel raises x 10 Standing heel raises x 10 Standing toe raises x 10 B Standing gastroc stretch 2x30 sec ea Seated HS stretch foot on 4 inch step 2x30 sec B Seated LAQ for active HS stretch x 10 ea Seated trunk ext x 10 and rotation and diagonals x 5 B Tandem stance x 30 sec bil ( add head turns next visit) SLS B 2 reps max hold  03/21/23 See pt ed and HEP   PATIENT EDUCATION:  Education details: PT eval findings, anticipated POC, initial HEP, and postural awareness  Person educated: Patient and Spouse Education method: Explanation, Demonstration, and Handouts Education comprehension: verbalized understanding and returned demonstration  HOME EXERCISE PROGRAM: Access Code: [email or web address] URL: [email or web address] Date: 03/25/2023 Prepared by: Raynelle Fanning  Exercises - Tandem Stance in Corner  - 1 x daily - 3-4 x weekly - 1 sets - 10 reps - Single Leg Stance  - 1 x daily - 3-4 x weekly - 1 sets - 10 reps - max hold - Gastroc Stretch on Wall  - 2 x daily - 7 x weekly - 1 sets - 2 reps - 30 hold - Seated Thoracic Extension  Arms Overhead  - 1 x daily - 7 x weekly - 2 sets - 5 reps - 2-3 sec hold - Seated Trunk Rotation  - 1 x daily - 7 x weekly - 2 sets - 5 reps - 2-3 sec hold - Seated Diagonal Chops with Medicine Ball  - 1 x daily - 7 x weekly - 2 sets - 5 reps - 2-3 hold - Seated Long Arc Quad  - 2 x daily - 7 x weekly - 1-2 sets - 10 reps  ASSESSMENT:  CLINICAL IMPRESSION: Verbal cues for upright head position during ex's.   Unable to make full contact with back against the wall secondary to kyphosis and decreased hip extension.  He demonstrates signs of great effort during many ex's but self rating of perceived exertion is "moderate".  He feels most challenged by the band rows with a tendency to lean his whole body backward rather than scapular retraction and trunk extension muscular engagement.  Therapist monitoring response throughout treatment session and providing verbal cues to optimize exercise technique and for postural correction.      Eval: Patient is a 82 y.o. male who was seen today for physical therapy evaluation and treatment for gait and posture abnormalities secondary to myelopathy and lumbar spondylolisthesis. He denies pain and reports he is here because the doctor sent him. His wife reports that she is concerned with his gait. His gait pattern presents with little to no heel strike, forward trunk lean, decreased trunk rotation, stride and step length and narrow BOS. His BERG indicates he is at lower risk for falls (25%) and he is challenged mainly by higher level balance like tandem stance and SLS. His TUG and 5XSTS are outside the norms for his age and indicate he is at risk for falls. He will benefit from skilled PT to address these deficits.    OBJECTIVE IMPAIRMENTS: Abnormal gait, decreased balance, decreased ROM, decreased strength, impaired flexibility, and postural dysfunction.   ACTIVITY LIMITATIONS: squatting and locomotion level  PARTICIPATION LIMITATIONS: shopping  PERSONAL FACTORS:  Age and 3+ comorbidities: Cervical myelopathy, B THA, brain meningioma  are also affecting patient's functional outcome.   REHAB POTENTIAL: Good  CLINICAL DECISION MAKING: Evolving/moderate complexity  EVALUATION COMPLEXITY: Moderate   GOALS: Goals reviewed with patient? Yes  SHORT TERM GOALS: Target date: 04/11/2023   Patient will be independent with initial HEP. Baseline:  Goal status: INITIAL  2.  Patient will demonstrate decreased fall risk by scoring < 11 sec on TUG. Baseline: 13.6 sec Goal status: INITIAL  3.  Patient will be educated on strategies to decrease risk of falls.  Baseline:  Goal status: INITIAL   LONG TERM GOALS: Target date: 05/02/2023  Patient will be independent with advanced/ongoing HEP to improve outcomes and carryover.  Baseline:  Goal status: INITIAL  2.  Patient will be able to ambulate 600' with LRAD with good safety to access community.  Baseline:  Goal status: INITIAL  3.  Patient will demonstrate improved functional LE strength as demonstrated by improved 5XSTS to 14.8 sec. Baseline: 17.5 sec Goal status: INITIAL  4.  Patient will demonstrate at least 19/24 on DGI to improve gait stability and reduce risk for falls. Baseline:  Goal status: INITIAL  5.  Patient will score 56/56 on Berg Balance test to demonstrate lower risk of falls.  Baseline: 52/56 Goal status: INITIAL  8.  Patient will demonstrate gait speed of >/= 1.8 ft/sec (0.55 m/s) to be a safe limited community ambulator with decreased risk for recurrent falls.  Baseline: TBD Goal status: INITIAL   PLAN:  PT FREQUENCY: 3x/week  PT DURATION: 6 weeks  PLANNED INTERVENTIONS: 97110-Therapeutic exercises, 97530- Therapeutic activity, 97112- Neuromuscular re-education, 97535- Self Care, 25366- Manual therapy, [phone]- Gait training, [phone]- Aquatic Therapy, Patient/Family education, Balance training, Dry Needling, Joint mobilization, Spinal mobilization, Cryotherapy, and Moist  heat.  PLAN FOR NEXT SESSION: follow up when pt returns from [place]; Higher level balance, postural strengthening, PWR moves. Ankle, HS flexibility. Gait.  Lavinia Sharps, PT 03/28/23 7:03 PM Phone: [phone] Fax: [phone]

## 2023-03-29 ENCOUNTER — Encounter: Payer: Medicare Other | Admitting: Physical Therapy

## 2023-04-02 ENCOUNTER — Ambulatory Visit: Payer: Medicare Other | Attending: Specialist

## 2023-04-02 ENCOUNTER — Encounter: Payer: Medicare Other | Admitting: Physical Therapy

## 2023-04-02 ENCOUNTER — Telehealth: Payer: Self-pay

## 2023-04-02 DIAGNOSIS — R293 Abnormal posture: Secondary | ICD-10-CM | POA: Insufficient documentation

## 2023-04-02 DIAGNOSIS — R252 Cramp and spasm: Secondary | ICD-10-CM | POA: Insufficient documentation

## 2023-04-02 DIAGNOSIS — R2681 Unsteadiness on feet: Secondary | ICD-10-CM | POA: Insufficient documentation

## 2023-04-02 DIAGNOSIS — R262 Difficulty in walking, not elsewhere classified: Secondary | ICD-10-CM | POA: Insufficient documentation

## 2023-04-02 DIAGNOSIS — R2689 Other abnormalities of gait and mobility: Secondary | ICD-10-CM | POA: Insufficient documentation

## 2023-04-02 DIAGNOSIS — M6281 Muscle weakness (generalized): Secondary | ICD-10-CM | POA: Insufficient documentation

## 2023-04-02 NOTE — Telephone Encounter (Signed)
Call placed due to no show for todays appt.  Spoke with wife.  They forgot and apologized for the inconvenience.  Asked if we had any openings to be able to reschedule for tomorrow.  We had an opening at 11 am and patient was scheduled.

## 2023-04-03 ENCOUNTER — Ambulatory Visit: Payer: Medicare Other

## 2023-04-03 DIAGNOSIS — R2681 Unsteadiness on feet: Secondary | ICD-10-CM | POA: Diagnosis not present

## 2023-04-03 DIAGNOSIS — R252 Cramp and spasm: Secondary | ICD-10-CM | POA: Diagnosis not present

## 2023-04-03 DIAGNOSIS — R2689 Other abnormalities of gait and mobility: Secondary | ICD-10-CM | POA: Diagnosis not present

## 2023-04-03 DIAGNOSIS — R293 Abnormal posture: Secondary | ICD-10-CM

## 2023-04-03 DIAGNOSIS — M6281 Muscle weakness (generalized): Secondary | ICD-10-CM | POA: Diagnosis not present

## 2023-04-03 DIAGNOSIS — R262 Difficulty in walking, not elsewhere classified: Secondary | ICD-10-CM

## 2023-04-03 NOTE — Therapy (Signed)
OUTPATIENT PHYSICAL THERAPY THORACOLUMBAR TREATMENT   Patient Name: Jesse [name] MRN: [id number] DOB:1940-12-21, 82 y.o., male Today's Date: 04/03/2023  END OF SESSION:  PT End of Session - 04/03/23 1106     Visit Number 5    Date for PT Re-Evaluation 05/02/23    Authorization Type MCR A/B    PT Start Time 1106    PT Stop Time 1145    PT Time Calculation (min) 39 min    Activity Tolerance Patient tolerated treatment well    Behavior During Therapy WFL for tasks assessed/performed              Past Medical History:  Diagnosis Date   Arthritis    Atrial fibrillation (HCC)    Complication of anesthesia    Atrial fibrillation during surgery    Dementia (HCC)    Depression    Hard of hearing    Hx of constipation    Osteoporosis    DEXA 11/09/10. Completed treatemtn with Fosamax.   Prostate cancer (HCC)    Restless legs    Thyroid disease    hypothyroidism   Vitiligo    Past Surgical History:  Procedure Laterality Date   BACK SURGERY     COLONOSCOPY     FLEXIBLE SIGMOIDOSCOPY N/A 09/27/2017   Procedure: FLEXIBLE SIGMOIDOSCOPY;  Surgeon: Jesse Boop, MD;  Location: [facility] ENDOSCOPY;  Service: Endoscopy;  Laterality: N/A;   JOINT REPLACEMENT     LAMINECTOMY  10/2011   cervical and lumbar laminectomy   PROSTATE BIOPSY  07/10/2011   PROSTATE BIOPSY  09/22/15   TOTAL HIP ARTHROPLASTY Right 02/29/2012   TOTAL HIP ARTHROPLASTY Right 07/04/2016   Procedure: RIGHT TOTAL HIP ARTHROPLASTY ANTERIOR APPROACH;  Surgeon: Jesse Frederic, MD;  Location: [facility] OR;  Service: Orthopedics;  Laterality: Right;   TRANSURETHRAL RESECTION OF PROSTATE     Patient Active Problem List   Diagnosis Date Noted   Wound drainage 09/10/2022   Vitamin B12 deficiency 08/26/2020   Restless legs 07/22/2019   History of skin cancer 01/12/2019   Erectile dysfunction 01/12/2019   Anxiety 01/12/2019   Volvulus of sigmoid colon s/p flex sig/rectal tube 09/27/2017 09/27/2017   Mild dementia (HCC) 09/27/2017    Chronic constipation 09/27/2017   Osteoarthritis of right hip 06/05/2016   Prostate cancer (HCC) 12/07/2015   Hypothyroidism 12/07/2015    PCP: Jesse Dark, MD   REFERRING PROVIDER: Corky Sing, MD   REFERRING DIAG: [phone] (ICD-10-CM) - Other spondylosis with myelopathy, cervical region, lumbar stenosis with spondylolisthesis  Rationale for Evaluation and Treatment: Rehabilitation  THERAPY DIAG:  Muscle weakness (generalized)  Cramp and spasm  Abnormal posture  Difficulty in walking, not elsewhere classified  Unsteadiness on feet  ONSET DATE: December 2023  SUBJECTIVE:  SUBJECTIVE STATEMENT: Patient states no issues since last visit. d Goes by Jesse [name] Wife: Jesse [name]   PERTINENT HISTORY:  Cervical myelopathy, B THA,( Left anterior 2017/R posterior 2013) meningioma in brain (inoperable and growing)  PAIN:  Are you having pain? No  PRECAUTIONS: Fall  RED FLAGS: None   WEIGHT BEARING RESTRICTIONS: No  FALLS:  Has patient fallen in last 6 months? No  LIVING ENVIRONMENT: Lives with: lives with their spouse Lives in: House/apartment Stairs: Yes: Internal: 17 steps; on right going up Has following equipment at home: Single point cane, Walker - 4 wheeled, and Wheelchair (manual)  OCCUPATION: retired  PLOF: Independent  PATIENT GOALS: get back in shape  NEXT MD VISIT: none scheduled  OBJECTIVE:  Note: Objective measures were completed at Evaluation unless otherwise noted.  DIAGNOSTIC FINDINGS:  BRAIN MRI IMPRESSION: 1. No specific or reversible explanation for memory loss. 2. 13 x 5 mm dural mass along the high left frontal convexity favoring meningioma. Follow-up could confirm expected stability.  PATIENT SURVEYS:  ABC scale 1390 / 1600 = 86.9  %  COGNITION: Overall cognitive status: Within functional limits for tasks assessed     SENSATION: Not tested  MUSCLE LENGTH: Marked HS tightness to less than 45 deg, B piriformis tightness, marked heel cord tightness  POSTURE: rounded shoulders, forward head, increased thoracic kyphosis, and flexed trunk   PALPATION: unremarkable  LUMBAR ROM:   AROM eval  Flexion   Extension neutral  Right lateral flexion   Left lateral flexion   Right rotation WFL  Left rotation WFL   (Blank rows = not tested)  LOWER EXTREMITY ROM:   lacks full knee extension B and ankle DF     Right eval Left eval  Hip flexion    Hip extension    Hip abduction    Hip adduction    Hip internal rotation    Hip external rotation    Knee flexion    Knee extension    Ankle dorsiflexion    Ankle plantarflexion    Ankle inversion    Ankle eversion     (Blank rows = not tested)    FUNCTIONAL TESTS:  5 times sit to stand: 17.5 sec Timed up and go (TUG): 13.6 Berg Balance Scale: 52/56 03/25/23 6 MWT 710 ft DGI 24/24  GAIT: Distance walked: 20 feet Assistive device utilized: None Level of assistance: Modified independence Comments: flat footed, forward trunk lean, decreased trunk rotation, stride and step length and narrow BOS  TODAY'S TREATMENT:                                                                                                                              DATE:   04/03/23 Nu-Step 5 min L5 seat 11 green machine while discussing status 2nd step hip flexor stretch with arm raise right/left cue for head up Standing hip flexor/quad stretch with foot in chair holding onto railings at steps 3 x 30 sec Sit  to stand holding 5# weight 5x, 2nd set with overhead press 5x 5#  Seated clam with yellow band 2 x 10 Seated march x 20 Seated LAQ x 20 each  Seated hamstring stretch 5 times each LE holding 10 sec each.  Standing at steps : gastroc stretch on rocker board (attempted bilateral  but unable to tolerate, did singles x 5 each holding 10 sec each) Gait and postural training, encouraged patient to work on postural elongation and heel strike as he walked toward lobby to exit.  He was able to maintain this for approx 30 feet with no further verbal cues  03/28/23 Nu-Step 5 min L5 seat 11 green machine while discussing status 2nd step hip flexor stretch with arm raise right/left cue for head up 2nd step HS stretch 5x right/left Standing heel raises x 10 6 inch step ups with 2 railing support 10x  Standing with aqua power cord around hips with cue to drive hips forward (uses bil railing support for balance) Sit to stand holding 5# weight 5x, 2nd set with overhead press 5x 5#  Back to the wall (unable to achieve 3 points of contact):  bil UE elevation 5x, snow angels 3 sets of 5 Seated thoracic extension with ball 15x At the railing hip abduction 5x right/left At the railing hip extension 5x right/left At the railing floor slider circles 5x right/left Push ups on the railing 10x RPE 5/10  03/27/23 Standing heel raises x 10 Counter hip abduction 5x right/left Counter hip extension 5x right/left Counter top push ups 10x Back to counter trunk extension Seated thoracic extension with ball 10x Seated trunk extension pink power cord 12x Sit to stand no UE assist 6x Dynamic reactive balance challenge: Circles on floor with toe tap, added colors on counter top for 2nd round no close supervision for safety (no UE support) Seated HS stretch foot on 8 inch stool 5 x30 sec B 2nd step hip flexor/gastroc  stretch with added UE elevation (cue for forward gaze) Up and down 4 steps with bil handrails 3x  (some knee pain on the 3rd round)  03/21/23 See pt ed and HEP   PATIENT EDUCATION:  Education details: PT eval findings, anticipated POC, initial HEP, and postural awareness  Person educated: Patient and Spouse Education method: Explanation, Demonstration, and  Handouts Education comprehension: verbalized understanding and returned demonstration  HOME EXERCISE PROGRAM: Access Code: [email or web address] URL: [email or web address] Date: 03/25/2023 Prepared by: Raynelle Fanning  Exercises - Tandem Stance in Corner  - 1 x daily - 3-4 x weekly - 1 sets - 10 reps - Single Leg Stance  - 1 x daily - 3-4 x weekly - 1 sets - 10 reps - max hold - Gastroc Stretch on Wall  - 2 x daily - 7 x weekly - 1 sets - 2 reps - 30 hold - Seated Thoracic Extension Arms Overhead  - 1 x daily - 7 x weekly - 2 sets - 5 reps - 2-3 sec hold - Seated Trunk Rotation  - 1 x daily - 7 x weekly - 2 sets - 5 reps - 2-3 sec hold - Seated Diagonal Chops with Medicine Ball  - 1 x daily - 7 x weekly - 2 sets - 5 reps - 2-3 hold - Seated Long Arc Quad  - 2 x daily - 7 x weekly - 1-2 sets - 10 reps  ASSESSMENT:  CLINICAL IMPRESSION: Jesse [name] continues to have significant postural and flexibility issues.  We worked on postural elongation throughout  session and heel strike with increased step length upon exiting session today.  He is much more stable with increased step length.  He was able to complete all tasks today with moderate fatigue.  He is compliant and well motivated.  He should continue to do well.        Eval: Patient is a 82 y.o. male who was seen today for physical therapy evaluation and treatment for gait and posture abnormalities secondary to myelopathy and lumbar spondylolisthesis. He denies pain and reports he is here because the doctor sent him. His wife reports that she is concerned with his gait. His gait pattern presents with little to no heel strike, forward trunk lean, decreased trunk rotation, stride and step length and narrow BOS. His BERG indicates he is at lower risk for falls (25%) and he is challenged mainly by higher level balance like tandem stance and SLS. His TUG and 5XSTS are outside the norms for his age and indicate he is at risk for falls. He will benefit from skilled  PT to address these deficits.    OBJECTIVE IMPAIRMENTS: Abnormal gait, decreased balance, decreased ROM, decreased strength, impaired flexibility, and postural dysfunction.   ACTIVITY LIMITATIONS: squatting and locomotion level  PARTICIPATION LIMITATIONS: shopping  PERSONAL FACTORS: Age and 3+ comorbidities: Cervical myelopathy, B THA, brain meningioma  are also affecting patient's functional outcome.   REHAB POTENTIAL: Good  CLINICAL DECISION MAKING: Evolving/moderate complexity  EVALUATION COMPLEXITY: Moderate   GOALS: Goals reviewed with patient? Yes  SHORT TERM GOALS: Target date: 04/11/2023   Patient will be independent with initial HEP. Baseline:  Goal status: MET 04/03/23  2.  Patient will demonstrate decreased fall risk by scoring < 11 sec on TUG. Baseline: 13.6 sec Goal status: INITIAL  3.  Patient will be educated on strategies to decrease risk of falls.  Baseline:  Goal status: IN PROGRESS   LONG TERM GOALS: Target date: 05/02/2023  Patient will be independent with advanced/ongoing HEP to improve outcomes and carryover.  Baseline:  Goal status: INITIAL  2.  Patient will be able to ambulate 600' with LRAD with good safety to access community.  Baseline:  Goal status: INITIAL  3.  Patient will demonstrate improved functional LE strength as demonstrated by improved 5XSTS to 14.8 sec. Baseline: 17.5 sec Goal status: INITIAL  4.  Patient will demonstrate at least 19/24 on DGI to improve gait stability and reduce risk for falls. Baseline:  Goal status: INITIAL  5.  Patient will score 56/56 on Berg Balance test to demonstrate lower risk of falls.  Baseline: 52/56 Goal status: INITIAL  8.  Patient will demonstrate gait speed of >/= 1.8 ft/sec (0.55 m/s) to be a safe limited community ambulator with decreased risk for recurrent falls.  Baseline: TBD Goal status: INITIAL   PLAN:  PT FREQUENCY: 3x/week  PT DURATION: 6 weeks  PLANNED INTERVENTIONS:  97110-Therapeutic exercises, 97530- Therapeutic activity, 97112- Neuromuscular re-education, 97535- Self Care, [id number]- Manual therapy, [phone]- Gait training, [phone]- Aquatic Therapy, Patient/Family education, Balance training, Dry Needling, Joint mobilization, Spinal mobilization, Cryotherapy, and Moist heat.  PLAN FOR NEXT SESSION: Continue hamstring and quad/hip flexor flexibility, higher level balance, postural strengthening, PWR moves. Ankle flexibility. Gait.  Victorino Dike B. Jeannelle Wiens, PT 04/03/23 12:45 PM [facility] [address] [place], [place] [address] Phone # [phone] Fax [phone]

## 2023-04-04 ENCOUNTER — Ambulatory Visit: Payer: Medicare Other | Admitting: Physical Therapy

## 2023-04-04 DIAGNOSIS — R293 Abnormal posture: Secondary | ICD-10-CM | POA: Diagnosis not present

## 2023-04-04 DIAGNOSIS — R2689 Other abnormalities of gait and mobility: Secondary | ICD-10-CM

## 2023-04-04 DIAGNOSIS — R262 Difficulty in walking, not elsewhere classified: Secondary | ICD-10-CM

## 2023-04-04 DIAGNOSIS — M6281 Muscle weakness (generalized): Secondary | ICD-10-CM | POA: Diagnosis not present

## 2023-04-04 DIAGNOSIS — R252 Cramp and spasm: Secondary | ICD-10-CM | POA: Diagnosis not present

## 2023-04-04 DIAGNOSIS — R2681 Unsteadiness on feet: Secondary | ICD-10-CM | POA: Diagnosis not present

## 2023-04-04 NOTE — Therapy (Signed)
OUTPATIENT PHYSICAL THERAPY THORACOLUMBAR TREATMENT   Patient Name: Jesse [name] MRN: [id number] DOB:[birth year], 82 y.o., male Today's Date: 04/04/2023  END OF SESSION:  PT End of Session - 04/04/23 1020     Visit Number 6    Date for PT Re-Evaluation 05/02/23    Authorization Type MCR A/B    Progress Note Due on Visit 10    PT Start Time 1017    PT Stop Time 1058    PT Time Calculation (min) 41 min    Activity Tolerance Patient tolerated treatment well              Past Medical History:  Diagnosis Date   Arthritis    Atrial fibrillation (HCC)    Complication of anesthesia    Atrial fibrillation during surgery    Dementia (HCC)    Depression    Hard of hearing    Hx of constipation    Osteoporosis    DEXA 11/09/10. Completed treatemtn with Fosamax.   Prostate cancer (HCC)    Restless legs    Thyroid disease    hypothyroidism   Vitiligo    Past Surgical History:  Procedure Laterality Date   BACK SURGERY     COLONOSCOPY     FLEXIBLE SIGMOIDOSCOPY N/A 09/27/2017   Procedure: FLEXIBLE SIGMOIDOSCOPY;  Surgeon: Iva Boop, MD;  Location: [facility] ENDOSCOPY;  Service: Endoscopy;  Laterality: N/A;   JOINT REPLACEMENT     LAMINECTOMY  10/2011   cervical and lumbar laminectomy   PROSTATE BIOPSY  07/10/2011   PROSTATE BIOPSY  09/22/15   TOTAL HIP ARTHROPLASTY Right 02/29/2012   TOTAL HIP ARTHROPLASTY Right 07/04/2016   Procedure: RIGHT TOTAL HIP ARTHROPLASTY ANTERIOR APPROACH;  Surgeon: Samson Frederic, MD;  Location: [facility] OR;  Service: Orthopedics;  Laterality: Right;   TRANSURETHRAL RESECTION OF PROSTATE     Patient Active Problem List   Diagnosis Date Noted   Wound drainage 09/10/2022   Vitamin B12 deficiency 08/26/2020   Restless legs 07/22/2019   History of skin cancer 01/12/2019   Erectile dysfunction 01/12/2019   Anxiety 01/12/2019   Volvulus of sigmoid colon s/p flex sig/rectal tube 09/27/2017 09/27/2017   Mild dementia (HCC) 09/27/2017   Chronic constipation  09/27/2017   Osteoarthritis of right hip 06/05/2016   Prostate cancer (HCC) 12/07/2015   Hypothyroidism 12/07/2015    PCP: Ardith Dark, MD   REFERRING PROVIDER: Corky Sing, MD   REFERRING DIAG: [phone] (ICD-10-CM) - Other spondylosis with myelopathy, cervical region, lumbar stenosis with spondylolisthesis  Rationale for Evaluation and Treatment: Rehabilitation  THERAPY DIAG:  Muscle weakness (generalized)  Other abnormalities of gait and mobility  Abnormal posture  Difficulty in walking, not elsewhere classified  ONSET DATE: December 2023  SUBJECTIVE:  SUBJECTIVE STATEMENT: No pain, just stiffness.   Goes by Jesse [name] Wife: [name]   PERTINENT HISTORY:  Cervical myelopathy, B THA,( Left anterior 2017/R posterior 2013) meningioma in brain (inoperable and growing)  PAIN:  Are you having pain? No  PRECAUTIONS: Fall  RED FLAGS: None   WEIGHT BEARING RESTRICTIONS: No  FALLS:  Has patient fallen in last 6 months? No  LIVING ENVIRONMENT: Lives with: lives with their spouse Lives in: House/apartment Stairs: Yes: Internal: 17 steps; on right going up Has following equipment at home: Single point cane, Walker - 4 wheeled, and Wheelchair (manual)  OCCUPATION: retired  PLOF: Independent  PATIENT GOALS: get back in shape  NEXT MD VISIT: none scheduled  OBJECTIVE:  Note: Objective measures were completed at Evaluation unless otherwise noted.  DIAGNOSTIC FINDINGS:  BRAIN MRI IMPRESSION: 1. No specific or reversible explanation for memory loss. 2. 13 x 5 mm dural mass along the high left frontal convexity favoring meningioma. Follow-up could confirm expected stability.  PATIENT SURVEYS:  ABC scale 1390 / 1600 = 86.9 %  COGNITION: Overall cognitive status: Within  functional limits for tasks assessed     SENSATION: Not tested  MUSCLE LENGTH: Marked HS tightness to less than 45 deg, B piriformis tightness, marked heel cord tightness  POSTURE: rounded shoulders, forward head, increased thoracic kyphosis, and flexed trunk   PALPATION: unremarkable  LUMBAR ROM:   AROM eval  Flexion   Extension neutral  Right lateral flexion   Left lateral flexion   Right rotation WFL  Left rotation WFL   (Blank rows = not tested)  LOWER EXTREMITY ROM:   lacks full knee extension B and ankle DF     Right eval Left eval  Hip flexion    Hip extension    Hip abduction    Hip adduction    Hip internal rotation    Hip external rotation    Knee flexion    Knee extension    Ankle dorsiflexion    Ankle plantarflexion    Ankle inversion    Ankle eversion     (Blank rows = not tested)    FUNCTIONAL TESTS:  5 times sit to stand: 17.5 sec Timed up and go (TUG): 13.6 Berg Balance Scale: 52/56 03/25/23 6 MWT 710 ft DGI 24/24  GAIT: Distance walked: 20 feet Assistive device utilized: None Level of assistance: Modified independence Comments: flat footed, forward trunk lean, decreased trunk rotation, stride and step length and narrow BOS  TODAY'S TREATMENT:                                                                                                                              DATE:   04/04/23 Nu-Step 5 min L5 seat 11 green machine while discussing status 2nd step hip flexor stretch cue for head up Doorway pec stretch with arm slides up the doorframe 10x right/left Green band rows 12x 5 Hurdles next to the counter lateral step overs  2 laps , FW step overs 4 laps single arm support (to increase step length) Resisted backward walk 10# 10x (can increase to 15# next time) Standing lat bar 30# 12x (easy at first but harder toward the end) RPE 2/10 Facing wall red ball roll up the wall 5x Back to the wall: shoulder blades against wall and keeping  buttocks/hips against the wall  Walking 2 laps around the gym with upright posture and increased step length Discussed self posture "testing" against the wall with patient and wife (see if you can get shoulder blades and buttocks touching) 04/03/23 Nu-Step 5 min L5 seat 11 green machine while discussing status 2nd step hip flexor stretch with arm raise right/left cue for head up Standing hip flexor/quad stretch with foot in chair holding onto railings at steps 3 x 30 sec Sit to stand holding 5# weight 5x, 2nd set with overhead press 5x 5#  Seated clam with yellow band 2 x 10 Seated march x 20 Seated LAQ x 20 each  Seated hamstring stretch 5 times each LE holding 10 sec each.  Standing at steps : gastroc stretch on rocker board (attempted bilateral but unable to tolerate, did singles x 5 each holding 10 sec each) Gait and postural training, encouraged patient to work on postural elongation and heel strike as he walked toward lobby to exit.  He was able to maintain this for approx 30 feet with no further verbal cues  03/28/23 Nu-Step 5 min L5 seat 11 green machine while discussing status 2nd step hip flexor stretch with arm raise right/left cue for head up 2nd step HS stretch 5x right/left Standing heel raises x 10 6 inch step ups with 2 railing support 10x  Standing with aqua power cord around hips with cue to drive hips forward (uses bil railing support for balance) Sit to stand holding 5# weight 5x, 2nd set with overhead press 5x 5#  Back to the wall (unable to achieve 3 points of contact):  bil UE elevation 5x, snow angels 3 sets of 5 Seated thoracic extension with ball 15x At the railing hip abduction 5x right/left At the railing hip extension 5x right/left At the railing floor slider circles 5x right/left Push ups on the railing 10x RPE 5/10  03/27/23 Standing heel raises x 10 Counter hip abduction 5x right/left Counter hip extension 5x right/left Counter top push ups 10x Back  to counter trunk extension Seated thoracic extension with ball 10x Seated trunk extension pink power cord 12x Sit to stand no UE assist 6x Dynamic reactive balance challenge: Circles on floor with toe tap, added colors on counter top for 2nd round no close supervision for safety (no UE support) Seated HS stretch foot on 8 inch stool 5 x30 sec B 2nd step hip flexor/gastroc  stretch with added UE elevation (cue for forward gaze) Up and down 4 steps with bil handrails 3x  (some knee pain on the 3rd round)     PATIENT EDUCATION:  Education details: PT eval findings, anticipated POC, initial HEP, and postural awareness  Person educated: Patient and Spouse Education method: Explanation, Demonstration, and Handouts Education comprehension: verbalized understanding and returned demonstration  HOME EXERCISE PROGRAM: Access Code: [email or web address] URL: [email or web address] Date: 03/25/2023 Prepared by: Raynelle Fanning  Exercises - Tandem Stance in Corner  - 1 x daily - 3-4 x weekly - 1 sets - 10 reps - Single Leg Stance  - 1 x daily - 3-4 x weekly - 1 sets - 10 reps - max hold -  Gastroc Stretch on Wall  - 2 x daily - 7 x weekly - 1 sets - 2 reps - 30 hold - Seated Thoracic Extension Arms Overhead  - 1 x daily - 7 x weekly - 2 sets - 5 reps - 2-3 sec hold - Seated Trunk Rotation  - 1 x daily - 7 x weekly - 2 sets - 5 reps - 2-3 sec hold - Seated Diagonal Chops with Medicine Ball  - 1 x daily - 7 x weekly - 2 sets - 5 reps - 2-3 hold - Seated Long Arc Quad  - 2 x daily - 7 x weekly - 1-2 sets - 10 reps  ASSESSMENT:  CLINICAL IMPRESSION: Treatment focus on postural correction ex's particularly hip flexor lengthening needed for increased step lengths and posterior chain strengthening for trunk extension.  Therapist providing verbal cues to optimize technique with  exercises in order to achieve the greatest benefit.  He shows some physical signs of fatigue during the session therefore encouraged  short rest breaks although he rates his self perceived exertion level very low throughout at 2/10.        Eval: Patient is a 82 y.o. male who was seen today for physical therapy evaluation and treatment for gait and posture abnormalities secondary to myelopathy and lumbar spondylolisthesis. He denies pain and reports he is here because the doctor sent him. His wife reports that she is concerned with his gait. His gait pattern presents with little to no heel strike, forward trunk lean, decreased trunk rotation, stride and step length and narrow BOS. His BERG indicates he is at lower risk for falls (25%) and he is challenged mainly by higher level balance like tandem stance and SLS. His TUG and 5XSTS are outside the norms for his age and indicate he is at risk for falls. He will benefit from skilled PT to address these deficits.    OBJECTIVE IMPAIRMENTS: Abnormal gait, decreased balance, decreased ROM, decreased strength, impaired flexibility, and postural dysfunction.   ACTIVITY LIMITATIONS: squatting and locomotion level  PARTICIPATION LIMITATIONS: shopping  PERSONAL FACTORS: Age and 3+ comorbidities: Cervical myelopathy, B THA, brain meningioma  are also affecting patient's functional outcome.   REHAB POTENTIAL: Good  CLINICAL DECISION MAKING: Evolving/moderate complexity  EVALUATION COMPLEXITY: Moderate   GOALS: Goals reviewed with patient? Yes  SHORT TERM GOALS: Target date: 04/11/2023   Patient will be independent with initial HEP. Baseline:  Goal status: MET 04/03/23  2.  Patient will demonstrate decreased fall risk by scoring < 11 sec on TUG. Baseline: 13.6 sec Goal status: INITIAL  3.  Patient will be educated on strategies to decrease risk of falls.  Baseline:  Goal status: IN PROGRESS   LONG TERM GOALS: Target date: 05/02/2023  Patient will be independent with advanced/ongoing HEP to improve outcomes and carryover.  Baseline:  Goal status: INITIAL  2.  Patient  will be able to ambulate 600' with LRAD with good safety to access community.  Baseline:  Goal status: INITIAL  3.  Patient will demonstrate improved functional LE strength as demonstrated by improved 5XSTS to 14.8 sec. Baseline: 17.5 sec Goal status: INITIAL  4.  Patient will demonstrate at least 19/24 on DGI to improve gait stability and reduce risk for falls. Baseline:  Goal status: INITIAL  5.  Patient will score 56/56 on Berg Balance test to demonstrate lower risk of falls.  Baseline: 52/56 Goal status: INITIAL  8.  Patient will demonstrate gait speed of >/= 1.8 ft/sec (0.55  m/s) to be a safe limited community ambulator with decreased risk for recurrent falls.  Baseline: TBD Goal status: INITIAL   PLAN:  PT FREQUENCY: 3x/week  PT DURATION: 6 weeks  PLANNED INTERVENTIONS: 97110-Therapeutic exercises, 97530- Therapeutic activity, 97112- Neuromuscular re-education, 97535- Self Care, 16109- Manual therapy, [phone]- Gait training, [phone]- Aquatic Therapy, Patient/Family education, Balance training, Dry Needling, Joint mobilization, Spinal mobilization, Cryotherapy, and Moist heat.  PLAN FOR NEXT SESSION: Continue hamstring and quad/hip flexor flexibility, higher level balance, postural strengthening, PWR moves. Ankle flexibility. Gait.  Lavinia Sharps, PT 04/04/23 1:35 PM Phone: [phone] Fax: [phone]  [facility] [address], [address] [place], [place] [address] Phone # [phone] Fax [phone]

## 2023-04-05 ENCOUNTER — Encounter: Payer: Self-pay | Admitting: Physical Therapy

## 2023-04-05 ENCOUNTER — Ambulatory Visit: Payer: Medicare Other | Admitting: Physical Therapy

## 2023-04-05 DIAGNOSIS — R262 Difficulty in walking, not elsewhere classified: Secondary | ICD-10-CM | POA: Diagnosis not present

## 2023-04-05 DIAGNOSIS — M6281 Muscle weakness (generalized): Secondary | ICD-10-CM

## 2023-04-05 DIAGNOSIS — R252 Cramp and spasm: Secondary | ICD-10-CM

## 2023-04-05 DIAGNOSIS — R293 Abnormal posture: Secondary | ICD-10-CM | POA: Diagnosis not present

## 2023-04-05 DIAGNOSIS — R2681 Unsteadiness on feet: Secondary | ICD-10-CM | POA: Diagnosis not present

## 2023-04-05 DIAGNOSIS — R2689 Other abnormalities of gait and mobility: Secondary | ICD-10-CM

## 2023-04-05 NOTE — Therapy (Signed)
OUTPATIENT PHYSICAL THERAPY THORACOLUMBAR TREATMENT   Patient Name: Jesse [name] MRN: [id number] DOB:1940/11/22, 82 y.o., male Today's Date: 04/05/2023  END OF SESSION:  PT End of Session - 04/05/23 0852     Visit Number 7    Date for PT Re-Evaluation 05/02/23    Authorization Type MCR A/B    Progress Note Due on Visit 10    PT Start Time [phone]    PT Stop Time 0930    PT Time Calculation (min) 41 min    Activity Tolerance Patient tolerated treatment well    Behavior During Therapy [facility] for tasks assessed/performed               Past Medical History:  Diagnosis Date   Arthritis    Atrial fibrillation (HCC)    Complication of anesthesia    Atrial fibrillation during surgery    Dementia (HCC)    Depression    Hard of hearing    Hx of constipation    Osteoporosis    DEXA 11/09/10. Completed treatemtn with Fosamax.   Prostate cancer (HCC)    Restless legs    Thyroid disease    hypothyroidism   Vitiligo    Past Surgical History:  Procedure Laterality Date   BACK SURGERY     COLONOSCOPY     FLEXIBLE SIGMOIDOSCOPY N/A 09/27/2017   Procedure: FLEXIBLE SIGMOIDOSCOPY;  Surgeon: Iva Boop, MD;  Location: [facility] ENDOSCOPY;  Service: Endoscopy;  Laterality: N/A;   JOINT REPLACEMENT     LAMINECTOMY  10/2011   cervical and lumbar laminectomy   PROSTATE BIOPSY  07/10/2011   PROSTATE BIOPSY  09/22/15   TOTAL HIP ARTHROPLASTY Right 02/29/2012   TOTAL HIP ARTHROPLASTY Right 07/04/2016   Procedure: RIGHT TOTAL HIP ARTHROPLASTY ANTERIOR APPROACH;  Surgeon: Samson Frederic, MD;  Location: [facility] OR;  Service: Orthopedics;  Laterality: Right;   TRANSURETHRAL RESECTION OF PROSTATE     Patient Active Problem List   Diagnosis Date Noted   Wound drainage 09/10/2022   Vitamin B12 deficiency 08/26/2020   Restless legs 07/22/2019   History of skin cancer 01/12/2019   Erectile dysfunction 01/12/2019   Anxiety 01/12/2019   Volvulus of sigmoid colon s/p flex sig/rectal tube 09/27/2017  09/27/2017   Mild dementia (HCC) 09/27/2017   Chronic constipation 09/27/2017   Osteoarthritis of right hip 06/05/2016   Prostate cancer (HCC) 12/07/2015   Hypothyroidism 12/07/2015    PCP: Ardith Dark, MD   REFERRING PROVIDER: Corky Sing, MD   REFERRING DIAG: [phone] (ICD-10-CM) - Other spondylosis with myelopathy, cervical region, lumbar stenosis with spondylolisthesis  Rationale for Evaluation and Treatment: Rehabilitation  THERAPY DIAG:  Muscle weakness (generalized)  Other abnormalities of gait and mobility  Abnormal posture  Difficulty in walking, not elsewhere classified  Cramp and spasm  ONSET DATE: December 2023  SUBJECTIVE:  SUBJECTIVE STATEMENT: I raked leaves and my feet were really hurting. It was hard.    Goes by [name] Wife: [name]   PERTINENT HISTORY:  Cervical myelopathy, B THA,( Left anterior 2017/R posterior 2013) meningioma in brain (inoperable and growing)  PAIN:  Are you having pain? No  PRECAUTIONS: Fall  RED FLAGS: None   WEIGHT BEARING RESTRICTIONS: No  FALLS:  Has patient fallen in last 6 months? No  LIVING ENVIRONMENT: Lives with: lives with their spouse Lives in: House/apartment Stairs: Yes: Internal: 17 steps; on right going up Has following equipment at home: Single point cane, Walker - 4 wheeled, and Wheelchair (manual)  OCCUPATION: retired  PLOF: Independent  PATIENT GOALS: get back in shape  NEXT MD VISIT: none scheduled  OBJECTIVE:  Note: Objective measures were completed at Evaluation unless otherwise noted.  DIAGNOSTIC FINDINGS:  BRAIN MRI IMPRESSION: 1. No specific or reversible explanation for memory loss. 2. 13 x 5 mm dural mass along the high left frontal convexity favoring meningioma. Follow-up could confirm  expected stability.  PATIENT SURVEYS:  ABC scale 1390 / 1600 = 86.9 %  COGNITION: Overall cognitive status: Within functional limits for tasks assessed     SENSATION: Not tested  MUSCLE LENGTH: Marked HS tightness to less than 45 deg, B piriformis tightness, marked heel cord tightness  POSTURE: rounded shoulders, forward head, increased thoracic kyphosis, and flexed trunk   PALPATION: unremarkable  LUMBAR ROM:   AROM eval  Flexion   Extension neutral  Right lateral flexion   Left lateral flexion   Right rotation WFL  Left rotation WFL   (Blank rows = not tested)  LOWER EXTREMITY ROM:   lacks full knee extension B and ankle DF     Right eval Left eval  Hip flexion    Hip extension    Hip abduction    Hip adduction    Hip internal rotation    Hip external rotation    Knee flexion    Knee extension    Ankle dorsiflexion    Ankle plantarflexion    Ankle inversion    Ankle eversion     (Blank rows = not tested)    FUNCTIONAL TESTS:  5 times sit to stand: 17.5 sec Timed up and go (TUG): 13.6 Berg Balance Scale: 52/56 03/25/23 6 MWT 710 ft DGI 24/24  GAIT: Distance walked: 20 feet Assistive device utilized: None Level of assistance: Modified independence Comments: flat footed, forward trunk lean, decreased trunk rotation, stride and step length and narrow BOS  TODAY'S TREATMENT:                                                                                                                              DATE:   04/05/23 Nu-Step 5 min L5 seat 11 green machine while discussing status 2nd step hip flexor stretch cue for head up 2 x30 sec B Doorway pec stretch with arm slides up the doorframe 2 x  30 sec right/left Green band rows 12x, then 2x6 in tandem stance  5 Hurdles next to the counter lateral step overs 2 laps , FW step overs 4 laps single arm support (to increase step length) Resisted backward walk 15# 10x Standing lat bar 30# 12x (easy at first but  harder toward the end) Facing wall red ball roll up the wall 5x Back to the wall: shoulder blades against wall and keeping buttocks/hips against the wall  Walking 2 laps around the gym with upright posture and increased step length   04/04/23 Nu-Step 5 min L5 seat 11 green machine while discussing status 2nd step hip flexor stretch cue for head up Doorway pec stretch with arm slides up the doorframe 10x right/left Green band rows 12x 5 Hurdles next to the counter lateral step overs 2 laps , FW step overs 4 laps single arm support (to increase step length) Resisted backward walk 10# 10x (can increase to 15# next time) Standing lat bar 30# 12x (easy at first but harder toward the end) RPE 2/10 Facing wall red ball roll up the wall 5x Back to the wall: shoulder blades against wall and keeping buttocks/hips against the wall  Walking 2 laps around the gym with upright posture and increased step length Discussed self posture "testing" against the wall with patient and wife (see if you can get shoulder blades and buttocks touching)  04/03/23 Nu-Step 5 min L5 seat 11 green machine while discussing status 2nd step hip flexor stretch with arm raise right/left cue for head up Standing hip flexor/quad stretch with foot in chair holding onto railings at steps 3 x 30 sec Sit to stand holding 5# weight 5x, 2nd set with overhead press 5x 5#  Seated clam with yellow band 2 x 10 Seated march x 20 Seated LAQ x 20 each  Seated hamstring stretch 5 times each LE holding 10 sec each.  Standing at steps : gastroc stretch on rocker board (attempted bilateral but unable to tolerate, did singles x 5 each holding 10 sec each) Gait and postural training, encouraged patient to work on postural elongation and heel strike as he walked toward lobby to exit.  He was able to maintain this for approx 30 feet with no further verbal cues     PATIENT EDUCATION:  Education details: PT eval findings, anticipated POC,  initial HEP, and postural awareness  Person educated: Patient and Spouse Education method: Explanation, Demonstration, and Handouts Education comprehension: verbalized understanding and returned demonstration  HOME EXERCISE PROGRAM: Access Code: [email or web address] URL: [email or web address] Date: 03/25/2023 Prepared by: Raynelle Fanning  Exercises - Tandem Stance in Corner  - 1 x daily - 3-4 x weekly - 1 sets - 10 reps - Single Leg Stance  - 1 x daily - 3-4 x weekly - 1 sets - 10 reps - max hold - Gastroc Stretch on Wall  - 2 x daily - 7 x weekly - 1 sets - 2 reps - 30 hold - Seated Thoracic Extension Arms Overhead  - 1 x daily - 7 x weekly - 2 sets - 5 reps - 2-3 sec hold - Seated Trunk Rotation  - 1 x daily - 7 x weekly - 2 sets - 5 reps - 2-3 sec hold - Seated Diagonal Chops with Medicine Ball  - 1 x daily - 7 x weekly - 2 sets - 5 reps - 2-3 hold - Seated Long Arc Quad  - 2 x daily - 7 x weekly - 1-2 sets - 10  reps  ASSESSMENT:  CLINICAL IMPRESSION: Craig demonstrates improved posture with exercises and walking without much cueing today. He does still need cueing for bigger step length but only initially. Then he maintained normal step length for duration of walking.   Eval: Patient is a 81 y.o. male who was seen today for physical therapy evaluation and treatment for gait and posture abnormalities secondary to myelopathy and lumbar spondylolisthesis. He denies pain and reports he is here because the doctor sent him. His wife reports that she is concerned with his gait. His gait pattern presents with little to no heel strike, forward trunk lean, decreased trunk rotation, stride and step length and narrow BOS. His BERG indicates he is at lower risk for falls (25%) and he is challenged mainly by higher level balance like tandem stance and SLS. His TUG and 5XSTS are outside the norms for his age and indicate he is at risk for falls. He will benefit from skilled PT to address these deficits.     OBJECTIVE IMPAIRMENTS: Abnormal gait, decreased balance, decreased ROM, decreased strength, impaired flexibility, and postural dysfunction.   ACTIVITY LIMITATIONS: squatting and locomotion level  PARTICIPATION LIMITATIONS: shopping  PERSONAL FACTORS: Age and 3+ comorbidities: Cervical myelopathy, B THA, brain meningioma  are also affecting patient's functional outcome.   REHAB POTENTIAL: Good  CLINICAL DECISION MAKING: Evolving/moderate complexity  EVALUATION COMPLEXITY: Moderate   GOALS: Goals reviewed with patient? Yes  SHORT TERM GOALS: Target date: 04/11/2023   Patient will be independent with initial HEP. Baseline:  Goal status: MET 04/03/23  2.  Patient will demonstrate decreased fall risk by scoring < 11 sec on TUG. Baseline: 13.6 sec Goal status: INITIAL  3.  Patient will be educated on strategies to decrease risk of falls.  Baseline:  Goal status: IN PROGRESS   LONG TERM GOALS: Target date: 05/02/2023  Patient will be independent with advanced/ongoing HEP to improve outcomes and carryover.  Baseline:  Goal status: INITIAL  2.  Patient will be able to ambulate 600' with LRAD with good safety to access community.  Baseline:  Goal status: INITIAL  3.  Patient will demonstrate improved functional LE strength as demonstrated by improved 5XSTS to 14.8 sec. Baseline: 17.5 sec Goal status: INITIAL  4.  Patient will demonstrate at least 19/24 on DGI to improve gait stability and reduce risk for falls. Baseline:  Goal status: INITIAL  5.  Patient will score 56/56 on Berg Balance test to demonstrate lower risk of falls.  Baseline: 52/56 Goal status: INITIAL  8.  Patient will demonstrate gait speed of >/= 1.8 ft/sec (0.55 m/s) to be a safe limited community ambulator with decreased risk for recurrent falls.  Baseline: TBD Goal status: INITIAL   PLAN:  PT FREQUENCY: 3x/week  PT DURATION: 6 weeks  PLANNED INTERVENTIONS: 97110-Therapeutic exercises,  97530- Therapeutic activity, 97112- Neuromuscular re-education, 97535- Self Care, 40981- Manual therapy, [phone]- Gait training, [phone]- Aquatic Therapy, Patient/Family education, Balance training, Dry Needling, Joint mobilization, Spinal mobilization, Cryotherapy, and Moist heat.  PLAN FOR NEXT SESSION: Continue hamstring and quad/hip flexor flexibility, higher level balance, postural strengthening, PWR moves. Ankle flexibility. Gait.  Solon Palm, PT  04/05/23 9:31 AM Phone: [phone] Fax: [phone]  [facility] [address], [address] [place], [place] [address] Phone # [phone] Fax [phone]

## 2023-04-07 NOTE — Therapy (Signed)
OUTPATIENT PHYSICAL THERAPY THORACOLUMBAR TREATMENT   Patient Name: Jesse [name] MRN: [id number] DOB:December 18, 1940, 82 y.o., male Today's Date: 04/08/2023  END OF SESSION:  PT End of Session - 04/08/23 1059     Visit Number 8    Date for PT Re-Evaluation 05/02/23    Authorization Type MCR A/B    Progress Note Due on Visit 10    PT Start Time 1059    PT Stop Time 1145    PT Time Calculation (min) 46 min    Activity Tolerance Patient tolerated treatment well    Behavior During Therapy WFL for tasks assessed/performed                Past Medical History:  Diagnosis Date   Arthritis    Atrial fibrillation (HCC)    Complication of anesthesia    Atrial fibrillation during surgery    Dementia (HCC)    Depression    Hard of hearing    Hx of constipation    Osteoporosis    DEXA 11/09/10. Completed treatemtn with Fosamax.   Prostate cancer (HCC)    Restless legs    Thyroid disease    hypothyroidism   Vitiligo    Past Surgical History:  Procedure Laterality Date   BACK SURGERY     COLONOSCOPY     FLEXIBLE SIGMOIDOSCOPY N/A 09/27/2017   Procedure: FLEXIBLE SIGMOIDOSCOPY;  Surgeon: Iva Boop, MD;  Location: [facility] ENDOSCOPY;  Service: Endoscopy;  Laterality: N/A;   JOINT REPLACEMENT     LAMINECTOMY  10/2011   cervical and lumbar laminectomy   PROSTATE BIOPSY  07/10/2011   PROSTATE BIOPSY  09/22/15   TOTAL HIP ARTHROPLASTY Right 02/29/2012   TOTAL HIP ARTHROPLASTY Right 07/04/2016   Procedure: RIGHT TOTAL HIP ARTHROPLASTY ANTERIOR APPROACH;  Surgeon: Samson Frederic, MD;  Location: [facility] OR;  Service: Orthopedics;  Laterality: Right;   TRANSURETHRAL RESECTION OF PROSTATE     Patient Active Problem List   Diagnosis Date Noted   Wound drainage 09/10/2022   Vitamin B12 deficiency 08/26/2020   Restless legs 07/22/2019   History of skin cancer 01/12/2019   Erectile dysfunction 01/12/2019   Anxiety 01/12/2019   Volvulus of sigmoid colon s/p flex sig/rectal tube 09/27/2017  09/27/2017   Mild dementia (HCC) 09/27/2017   Chronic constipation 09/27/2017   Osteoarthritis of right hip 06/05/2016   Prostate cancer (HCC) 12/07/2015   Hypothyroidism 12/07/2015    PCP: Ardith Dark, MD   REFERRING PROVIDER: Corky Sing, MD   REFERRING DIAG: [phone] (ICD-10-CM) - Other spondylosis with myelopathy, cervical region, lumbar stenosis with spondylolisthesis  Rationale for Evaluation and Treatment: Rehabilitation  THERAPY DIAG:  Muscle weakness (generalized)  Other abnormalities of gait and mobility  Abnormal posture  Difficulty in walking, not elsewhere classified  Cramp and spasm  Unsteadiness on feet  ONSET DATE: December 2023  SUBJECTIVE:  SUBJECTIVE STATEMENT: Patient reports no new complaints.   Goes by Jesse [name] Wife: [name]   PERTINENT HISTORY:  Cervical myelopathy, B THA,( Left anterior 2017/R posterior 2013) meningioma in brain (inoperable and growing)  PAIN:  Are you having pain? No  PRECAUTIONS: Fall  RED FLAGS: None   WEIGHT BEARING RESTRICTIONS: No  FALLS:  Has patient fallen in last 6 months? No  LIVING ENVIRONMENT: Lives with: lives with their spouse Lives in: House/apartment Stairs: Yes: Internal: 17 steps; on right going up Has following equipment at home: Single point cane, Walker - 4 wheeled, and Wheelchair (manual)  OCCUPATION: retired  PLOF: Independent  PATIENT GOALS: get back in shape  NEXT MD VISIT: none scheduled  OBJECTIVE:  Note: Objective measures were completed at Evaluation unless otherwise noted.  DIAGNOSTIC FINDINGS:  BRAIN MRI IMPRESSION: 1. No specific or reversible explanation for memory loss. 2. 13 x 5 mm dural mass along the high left frontal convexity favoring meningioma. Follow-up could confirm  expected stability.  PATIENT SURVEYS:  ABC scale 1390 / 1600 = 86.9 %  COGNITION: Overall cognitive status: Within functional limits for tasks assessed     SENSATION: Not tested  MUSCLE LENGTH: Marked HS tightness to less than 45 deg, B piriformis tightness, marked heel cord tightness  POSTURE: rounded shoulders, forward head, increased thoracic kyphosis, and flexed trunk   PALPATION: unremarkable  LUMBAR ROM:   AROM eval  Flexion   Extension neutral  Right lateral flexion   Left lateral flexion   Right rotation WFL  Left rotation WFL   (Blank rows = not tested)  LOWER EXTREMITY ROM:   lacks full knee extension B and ankle DF     Right eval Left eval  Hip flexion    Hip extension    Hip abduction    Hip adduction    Hip internal rotation    Hip external rotation    Knee flexion    Knee extension    Ankle dorsiflexion    Ankle plantarflexion    Ankle inversion    Ankle eversion     (Blank rows = not tested)    FUNCTIONAL TESTS:  5 times sit to stand: 17.5 sec Timed up and go (TUG): 13.6 Berg Balance Scale: 52/56 03/25/23 6 MWT 710 ft DGI 24/24  GAIT: Distance walked: 20 feet Assistive device utilized: None Level of assistance: Modified independence Comments: flat footed, forward trunk lean, decreased trunk rotation, stride and step length and narrow BOS  TODAY'S TREATMENT:                                                                                                                              DATE:   04/08/23 BIKE L2 x 6 Fall prevention techniques reviewed and handout provided Gait Speed tested 32.8 ft/10.14 sec = 3.23 ft/sec 2nd step hip flexor stretch cue for head up 2 x30 sec B Doorway pec stretch with arm slides up the doorframe 2  x 30 sec right/left Green band rows and shoulder extension 12 x in tandem stance 5 Hurdles next to the counter lateral step overs 4 laps , FW step overs 4 laps with intermittent single arm support (to increase  step length) Resisted backward walk 15# 10x Standing lat bar 30# 12x  Facing wall red ball roll up the wall 10x Standing toe taps cones x 10 B  04/05/23 Nu-Step 5 min L5 seat 11 green machine while discussing status 2nd step hip flexor stretch cue for head up 2 x30 sec B Doorway pec stretch with arm slides up the doorframe 2 x 30 sec right/left Green band rows 12x, then 2x6 in tandem stance  5 Hurdles next to the counter lateral step overs 2 laps , FW step overs 4 laps single arm support (to increase step length) Resisted backward walk 15# 10x Standing lat bar 30# 12x (easy at first but harder toward the end) Facing wall red ball roll up the wall 5x Back to the wall: shoulder blades against wall and keeping buttocks/hips against the wall  Walking 2 laps around the gym with upright posture and increased step length   04/04/23 Nu-Step 5 min L5 seat 11 green machine while discussing status 2nd step hip flexor stretch cue for head up Doorway pec stretch with arm slides up the doorframe 10x right/left Green band rows 12x 5 Hurdles next to the counter lateral step overs 2 laps , FW step overs 4 laps single arm support (to increase step length) Resisted backward walk 10# 10x (can increase to 15# next time) Standing lat bar 30# 12x (easy at first but harder toward the end) RPE 2/10 Facing wall red ball roll up the wall 5x Back to the wall: shoulder blades against wall and keeping buttocks/hips against the wall  Walking 2 laps around the gym with upright posture and increased step length Discussed self posture "testing" against the wall with patient and wife (see if you can get shoulder blades and buttocks touching)      PATIENT EDUCATION:  Education details: PT eval findings, anticipated POC, initial HEP, and postural awareness  Person educated: Patient and Spouse Education method: Explanation, Demonstration, and Handouts Education comprehension: verbalized understanding and returned  demonstration  HOME EXERCISE PROGRAM: Access Code: [email or web address] URL: [email or web address] Date: 03/25/2023 Prepared by: Raynelle Fanning  Exercises - Tandem Stance in Corner  - 1 x daily - 3-4 x weekly - 1 sets - 10 reps - Single Leg Stance  - 1 x daily - 3-4 x weekly - 1 sets - 10 reps - max hold - Gastroc Stretch on Wall  - 2 x daily - 7 x weekly - 1 sets - 2 reps - 30 hold - Seated Thoracic Extension Arms Overhead  - 1 x daily - 7 x weekly - 2 sets - 5 reps - 2-3 sec hold - Seated Trunk Rotation  - 1 x daily - 7 x weekly - 2 sets - 5 reps - 2-3 sec hold - Seated Diagonal Chops with Medicine Ball  - 1 x daily - 7 x weekly - 2 sets - 5 reps - 2-3 hold - Seated Long Arc Quad  - 2 x daily - 7 x weekly - 1-2 sets - 10 reps  ASSESSMENT:  CLINICAL IMPRESSION: Deroy continues to do well with all exercises. New balance activities with cones were challenging and he will benefit from more single leg stance activities. He is improving with hurdles. His gait speed was tested today and  his LTG for this was met. TUG should be assessed next visit for STG and HEP should be progressed as well. Maruice continues to demonstrate potential for improvement and would benefit from continued skilled therapy to address impairments.    Eval: Patient is a 82 y.o. male who was seen today for physical therapy evaluation and treatment for gait and posture abnormalities secondary to myelopathy and lumbar spondylolisthesis. He denies pain and reports he is here because the doctor sent him. His wife reports that she is concerned with his gait. His gait pattern presents with little to no heel strike, forward trunk lean, decreased trunk rotation, stride and step length and narrow BOS. His BERG indicates he is at lower risk for falls (25%) and he is challenged mainly by higher level balance like tandem stance and SLS. His TUG and 5XSTS are outside the norms for his age and indicate he is at risk for falls. He will benefit from  skilled PT to address these deficits.    OBJECTIVE IMPAIRMENTS: Abnormal gait, decreased balance, decreased ROM, decreased strength, impaired flexibility, and postural dysfunction.   ACTIVITY LIMITATIONS: squatting and locomotion level  PARTICIPATION LIMITATIONS: shopping  PERSONAL FACTORS: Age and 3+ comorbidities: Cervical myelopathy, B THA, brain meningioma  are also affecting patient's functional outcome.   REHAB POTENTIAL: Good  CLINICAL DECISION MAKING: Evolving/moderate complexity  EVALUATION COMPLEXITY: Moderate   GOALS: Goals reviewed with patient? Yes  SHORT TERM GOALS: Target date: 04/11/2023   Patient will be independent with initial HEP. Baseline:  Goal status: MET 04/03/23  2.  Patient will demonstrate decreased fall risk by scoring < 11 sec on TUG. Baseline: 13.6 sec Goal status: INITIAL  3.  Patient will be educated on strategies to decrease risk of falls.  Baseline:  Goal status: MET 04/08/23   LONG TERM GOALS: Target date: 05/02/2023  Patient will be independent with advanced/ongoing HEP to improve outcomes and carryover.  Baseline:  Goal status: INITIAL  2.  Patient will be able to ambulate 600' with LRAD with good safety to access community.  Baseline:  Goal status: INITIAL  3.  Patient will demonstrate improved functional LE strength as demonstrated by improved 5XSTS to 14.8 sec. Baseline: 17.5 sec Goal status: INITIAL  4.  Patient will demonstrate at least 19/24 on DGI to improve gait stability and reduce risk for falls. Baseline:  Goal status: MET  5.  Patient will score 56/56 on Berg Balance test to demonstrate lower risk of falls.  Baseline: 52/56 Goal status: INITIAL  8.  Patient will demonstrate gait speed of >/= 1.8 ft/sec (0.55 m/s) to be a safe limited community ambulator with decreased risk for recurrent falls.  Baseline:  Goal status: MET 3.61ft/sec 04/08/23   PLAN:  PT FREQUENCY: 3x/week  PT DURATION: 6 weeks  PLANNED  INTERVENTIONS: 97110-Therapeutic exercises, 97530- Therapeutic activity, 97112- Neuromuscular re-education, 97535- Self Care, 69629- Manual therapy, [phone]- Gait training, [phone]- Aquatic Therapy, Patient/Family education, Balance training, Dry Needling, Joint mobilization, Spinal mobilization, Cryotherapy, and Moist heat.  PLAN FOR NEXT SESSION: Check TUG for STG, Continue SLS activities, hamstring and quad/hip flexor flexibility, higher level balance, postural strengthening,  Ankle flexibility. Gait.  Solon Palm, PT  04/08/23 11:46 AM Phone: [phone] Fax: [phone]  [facility] [address], [address] [place], [place] [address] Phone # [phone] Fax [phone]

## 2023-04-08 ENCOUNTER — Ambulatory Visit: Payer: Medicare Other | Admitting: Physical Therapy

## 2023-04-08 ENCOUNTER — Encounter: Payer: Self-pay | Admitting: Physical Therapy

## 2023-04-08 DIAGNOSIS — M6281 Muscle weakness (generalized): Secondary | ICD-10-CM | POA: Diagnosis not present

## 2023-04-08 DIAGNOSIS — R2681 Unsteadiness on feet: Secondary | ICD-10-CM | POA: Diagnosis not present

## 2023-04-08 DIAGNOSIS — R252 Cramp and spasm: Secondary | ICD-10-CM

## 2023-04-08 DIAGNOSIS — R293 Abnormal posture: Secondary | ICD-10-CM

## 2023-04-08 DIAGNOSIS — R2689 Other abnormalities of gait and mobility: Secondary | ICD-10-CM | POA: Diagnosis not present

## 2023-04-08 DIAGNOSIS — R262 Difficulty in walking, not elsewhere classified: Secondary | ICD-10-CM

## 2023-04-08 NOTE — Patient Instructions (Signed)

## 2023-04-10 ENCOUNTER — Ambulatory Visit: Payer: Medicare Other | Admitting: Physical Therapy

## 2023-04-10 DIAGNOSIS — R293 Abnormal posture: Secondary | ICD-10-CM

## 2023-04-10 DIAGNOSIS — R262 Difficulty in walking, not elsewhere classified: Secondary | ICD-10-CM | POA: Diagnosis not present

## 2023-04-10 DIAGNOSIS — R2681 Unsteadiness on feet: Secondary | ICD-10-CM | POA: Diagnosis not present

## 2023-04-10 DIAGNOSIS — R252 Cramp and spasm: Secondary | ICD-10-CM | POA: Diagnosis not present

## 2023-04-10 DIAGNOSIS — R2689 Other abnormalities of gait and mobility: Secondary | ICD-10-CM | POA: Diagnosis not present

## 2023-04-10 DIAGNOSIS — M6281 Muscle weakness (generalized): Secondary | ICD-10-CM

## 2023-04-10 NOTE — Therapy (Signed)
OUTPATIENT PHYSICAL THERAPY THORACOLUMBAR TREATMENT   Patient Name: Jesse [name] MRN: [id number] DOB:Jul 29, 1940, 82 y.o., male Today's Date: 04/10/2023  END OF SESSION:  PT End of Session - 04/10/23 0842     Visit Number 9    Date for PT Re-Evaluation 05/02/23    Authorization Type MCR A/B    Progress Note Due on Visit 10    PT Start Time 0843    PT Stop Time 0925    PT Time Calculation (min) 42 min    Activity Tolerance Patient tolerated treatment well                Past Medical History:  Diagnosis Date   Arthritis    Atrial fibrillation (HCC)    Complication of anesthesia    Atrial fibrillation during surgery    Dementia (HCC)    Depression    Hard of hearing    Hx of constipation    Osteoporosis    DEXA 11/09/10. Completed treatemtn with Fosamax.   Prostate cancer (HCC)    Restless legs    Thyroid disease    hypothyroidism   Vitiligo    Past Surgical History:  Procedure Laterality Date   BACK SURGERY     COLONOSCOPY     FLEXIBLE SIGMOIDOSCOPY N/A 09/27/2017   Procedure: FLEXIBLE SIGMOIDOSCOPY;  Surgeon: Jesse Boop, MD;  Location: [facility] ENDOSCOPY;  Service: Endoscopy;  Laterality: N/A;   JOINT REPLACEMENT     LAMINECTOMY  10/2011   cervical and lumbar laminectomy   PROSTATE BIOPSY  07/10/2011   PROSTATE BIOPSY  09/22/15   TOTAL HIP ARTHROPLASTY Right 02/29/2012   TOTAL HIP ARTHROPLASTY Right 07/04/2016   Procedure: RIGHT TOTAL HIP ARTHROPLASTY ANTERIOR APPROACH;  Surgeon: Jesse Frederic, MD;  Location: [facility] OR;  Service: Orthopedics;  Laterality: Right;   TRANSURETHRAL RESECTION OF PROSTATE     Patient Active Problem List   Diagnosis Date Noted   Wound drainage 09/10/2022   Vitamin B12 deficiency 08/26/2020   Restless legs 07/22/2019   History of skin cancer 01/12/2019   Erectile dysfunction 01/12/2019   Anxiety 01/12/2019   Volvulus of sigmoid colon s/p flex sig/rectal tube 09/27/2017 09/27/2017   Mild dementia (HCC) 09/27/2017   Chronic  constipation 09/27/2017   Osteoarthritis of right hip 06/05/2016   Prostate cancer (HCC) 12/07/2015   Hypothyroidism 12/07/2015    PCP: Jesse Dark, MD   REFERRING PROVIDER: Corky Sing, MD   REFERRING DIAG: [phone] (ICD-10-CM) - Other spondylosis with myelopathy, cervical region, lumbar stenosis with spondylolisthesis  Rationale for Evaluation and Treatment: Rehabilitation  THERAPY DIAG:  Muscle weakness (generalized)  Other abnormalities of gait and mobility  Abnormal posture  Difficulty in walking, not elsewhere classified  ONSET DATE: December 2023  SUBJECTIVE:  SUBJECTIVE STATEMENT: A little muscle soreness after last visit but not bad. States his balance has been OK.  Goes by [name] Wife: Jesse [name]   PERTINENT HISTORY:  Cervical myelopathy, B THA,( Left anterior 2017/R posterior 2013) meningioma in brain (inoperable and growing)  PAIN:  Are you having pain? No  PRECAUTIONS: Fall  RED FLAGS: None   WEIGHT BEARING RESTRICTIONS: No  FALLS:  Has patient fallen in last 6 months? No  LIVING ENVIRONMENT: Lives with: lives with their spouse Lives in: House/apartment Stairs: Yes: Internal: 17 steps; on right going up Has following equipment at home: Single point cane, Walker - 4 wheeled, and Wheelchair (manual)  OCCUPATION: retired  PLOF: Independent  PATIENT GOALS: get back in shape  NEXT MD VISIT: none scheduled  OBJECTIVE:  Note: Objective measures were completed at Evaluation unless otherwise noted.  DIAGNOSTIC FINDINGS:  BRAIN MRI IMPRESSION: 1. No specific or reversible explanation for memory loss. 2. 13 x 5 mm dural mass along the high left frontal convexity favoring meningioma. Follow-up could confirm expected stability.  PATIENT SURVEYS:  ABC scale  1390 / 1600 = 86.9 %  COGNITION: Overall cognitive status: Within functional limits for tasks assessed     SENSATION: Not tested  MUSCLE LENGTH: Marked HS tightness to less than 45 deg, B piriformis tightness, marked heel cord tightness  POSTURE: rounded shoulders, forward head, increased thoracic kyphosis, and flexed trunk   PALPATION: unremarkable  LUMBAR ROM:   AROM eval  Flexion   Extension neutral  Right lateral flexion   Left lateral flexion   Right rotation WFL  Left rotation WFL   (Blank rows = not tested)  LOWER EXTREMITY ROM:   lacks full knee extension B and ankle DF     Right eval Left eval  Hip flexion    Hip extension    Hip abduction    Hip adduction    Hip internal rotation    Hip external rotation    Knee flexion    Knee extension    Ankle dorsiflexion    Ankle plantarflexion    Ankle inversion    Ankle eversion     (Blank rows = not tested)    FUNCTIONAL TESTS:  5 times sit to stand: 17.5 sec Timed up and go (TUG): 13.6 Berg Balance Scale: 52/56 03/25/23 6 MWT 710 ft DGI 24/24 11/13:  TUG  11.28  GAIT: Distance walked: 20 feet Assistive device utilized: None Level of assistance: Modified independence Comments: flat footed, forward trunk lean, decreased trunk rotation, stride and step length and narrow BOS  TODAY'S TREATMENT:                                                                                                                              DATE:  04/10/23: Nu-Step L3 x 6 while discussing session plan Doorway pec stretch with arm slides up the doorframe 3 sets of 5 right/left (added to HEP) Ladder walk: forward walk (  long steps) lateral stepping, in/out of each box, diagonal stepping  Walk with longer steps added cog challenge (added to HEP hallway walking with focus on longer steps and forward gaze) Clock Yourself app 30 spm simple clock to challenge single leg dynamic balance 2 min 5x STS (added to HEP)  Standing lat  bar 30# 12x  Facing wall beach ball toss against the wall 10x  04/08/23 BIKE L2 x 6 Fall prevention techniques reviewed and handout provided Gait Speed tested 32.8 ft/10.14 sec = 3.23 ft/sec 2nd step hip flexor stretch cue for head up 2 x30 sec B Doorway pec stretch with arm slides up the doorframe 2 x 30 sec right/left Green band rows and shoulder extension 12 x in tandem stance 5 Hurdles next to the counter lateral step overs 4 laps , FW step overs 4 laps with intermittent single arm support (to increase step length) Resisted backward walk 15# 10x Standing lat bar 30# 12x  Facing wall red ball roll up the wall 10x Standing toe taps cones x 10 B  04/05/23 Nu-Step 5 min L5 seat 11 green machine while discussing status 2nd step hip flexor stretch cue for head up 2 x30 sec B Doorway pec stretch with arm slides up the doorframe 2 x 30 sec right/left Green band rows 12x, then 2x6 in tandem stance  5 Hurdles next to the counter lateral step overs 2 laps , FW step overs 4 laps single arm support (to increase step length) Resisted backward walk 15# 10x Standing lat bar 30# 12x (easy at first but harder toward the end) Facing wall red ball roll up the wall 5x Back to the wall: shoulder blades against wall and keeping buttocks/hips against the wall  Walking 2 laps around the gym with upright posture and increased step length   04/04/23 Nu-Step 5 min L5 seat 11 green machine while discussing status 2nd step hip flexor stretch cue for head up Doorway pec stretch with arm slides up the doorframe 10x right/left Green band rows 12x 5 Hurdles next to the counter lateral step overs 2 laps , FW step overs 4 laps single arm support (to increase step length) Resisted backward walk 10# 10x (can increase to 15# next time) Standing lat bar 30# 12x (easy at first but harder toward the end) RPE 2/10 Facing wall red ball roll up the wall 5x Back to the wall: shoulder blades against wall and keeping  buttocks/hips against the wall  Walking 2 laps around the gym with upright posture and increased step length Discussed self posture "testing" against the wall with patient and wife (see if you can get shoulder blades and buttocks touching)      PATIENT EDUCATION:  Education details: PT eval findings, anticipated POC, initial HEP, and postural awareness  Person educated: Patient and Spouse Education method: Explanation, Demonstration, and Handouts Education comprehension: verbalized understanding and returned demonstration  HOME EXERCISE PROGRAM: Access Code: [email or web address] URL: [email or web address] Date: 04/10/2023 Prepared by: Lavinia Sharps  Exercises - Tandem Stance in Corner  - 1 x daily - 3-4 x weekly - 1 sets - 10 reps - Single Leg Stance  - 1 x daily - 3-4 x weekly - 1 sets - 10 reps - max hold - Gastroc Stretch on Wall  - 2 x daily - 7 x weekly - 1 sets - 2 reps - 30 hold - Seated Thoracic Extension Arms Overhead  - 1 x daily - 7 x weekly - 2 sets - 5 reps -  2-3 sec hold - Seated Trunk Rotation  - 1 x daily - 7 x weekly - 2 sets - 5 reps - 2-3 sec hold - Seated Diagonal Chops with Medicine Ball  - 1 x daily - 7 x weekly - 2 sets - 5 reps - 2-3 hold - Seated Long Arc Quad  - 2 x daily - 7 x weekly - 1-2 sets - 10 reps - Doorway Pec Stretch at 90 Degrees Abduction  - 1 x daily - 7 x weekly - 3 sets - 5 reps - Sit to Stand with Arms Crossed  - 1 x daily - 7 x weekly - 1 sets - 10 reps - Walking  - 1 x daily - 7 x weekly - 1 sets - 10 reps  ASSESSMENT:  CLINICAL IMPRESSION: Treatment focus on single leg dynamic balance exercise and increasing step length with gait.  He is very challenged with added cognitive component during these ex's.  CGA provided for safety with balance ex but no physical assistance needed.  Therapist progressing and updating HEP for increased intensity and challenge level for further strengthening and functional mobility.   2 second improvement  with TUG just short of meeting STG.    Eval: Patient is a 82 y.o. male who was seen today for physical therapy evaluation and treatment for gait and posture abnormalities secondary to myelopathy and lumbar spondylolisthesis. He denies pain and reports he is here because the doctor sent him. His wife reports that she is concerned with his gait. His gait pattern presents with little to no heel strike, forward trunk lean, decreased trunk rotation, stride and step length and narrow BOS. His BERG indicates he is at lower risk for falls (25%) and he is challenged mainly by higher level balance like tandem stance and SLS. His TUG and 5XSTS are outside the norms for his age and indicate he is at risk for falls. He will benefit from skilled PT to address these deficits.    OBJECTIVE IMPAIRMENTS: Abnormal gait, decreased balance, decreased ROM, decreased strength, impaired flexibility, and postural dysfunction.   ACTIVITY LIMITATIONS: squatting and locomotion level  PARTICIPATION LIMITATIONS: shopping  PERSONAL FACTORS: Age and 3+ comorbidities: Cervical myelopathy, B THA, brain meningioma  are also affecting patient's functional outcome.   REHAB POTENTIAL: Good  CLINICAL DECISION MAKING: Evolving/moderate complexity  EVALUATION COMPLEXITY: Moderate   GOALS: Goals reviewed with patient? Yes  SHORT TERM GOALS: Target date: 04/11/2023   Patient will be independent with initial HEP. Baseline:  Goal status: MET 04/03/23  2.  Patient will demonstrate decreased fall risk by scoring < 11 sec on TUG. Baseline: 13.6 sec Goal status: partially met 11/13 11.28  3.  Patient will be educated on strategies to decrease risk of falls.  Baseline:  Goal status: MET 04/08/23   LONG TERM GOALS: Target date: 05/02/2023  Patient will be independent with advanced/ongoing HEP to improve outcomes and carryover.  Baseline:  Goal status: INITIAL  2.  Patient will be able to ambulate 600' with LRAD with good  safety to access community.  Baseline:  Goal status: INITIAL  3.  Patient will demonstrate improved functional LE strength as demonstrated by improved 5XSTS to 14.8 sec. Baseline: 17.5 sec Goal status: INITIAL  4.  Patient will demonstrate at least 19/24 on DGI to improve gait stability and reduce risk for falls. Baseline:  Goal status: MET  5.  Patient will score 56/56 on Berg Balance test to demonstrate lower risk of falls.  Baseline:  52/56 Goal status: INITIAL  8.  Patient will demonstrate gait speed of >/= 1.8 ft/sec (0.55 m/s) to be a safe limited community ambulator with decreased risk for recurrent falls.  Baseline:  Goal status: MET 3.7ft/sec 04/08/23   PLAN:  PT FREQUENCY: 3x/week  PT DURATION: 6 weeks  PLANNED INTERVENTIONS: 97110-Therapeutic exercises, 97530- Therapeutic activity, 97112- Neuromuscular re-education, 97535- Self Care, 08657- Manual therapy, [phone]- Gait training, [phone]- Aquatic Therapy, Patient/Family education, Balance training, Dry Needling, Joint mobilization, Spinal mobilization, Cryotherapy, and Moist heat.  PLAN FOR NEXT SESSION:  Continue SLS activities, hamstring and quad/hip flexor flexibility, higher level balance, postural strengthening,  Ankle flexibility. Gait.  Lavinia Sharps, PT 04/10/23 9:39 AM Phone: [phone] Fax: [phone]  [facility] [address] [place], [place] [address] Phone # [phone] Fax [phone]

## 2023-04-11 ENCOUNTER — Encounter: Payer: Self-pay | Admitting: Physical Therapy

## 2023-04-11 ENCOUNTER — Ambulatory Visit: Payer: Medicare Other | Admitting: Physical Therapy

## 2023-04-11 DIAGNOSIS — R2689 Other abnormalities of gait and mobility: Secondary | ICD-10-CM

## 2023-04-11 DIAGNOSIS — R293 Abnormal posture: Secondary | ICD-10-CM | POA: Diagnosis not present

## 2023-04-11 DIAGNOSIS — M6281 Muscle weakness (generalized): Secondary | ICD-10-CM | POA: Diagnosis not present

## 2023-04-11 DIAGNOSIS — R252 Cramp and spasm: Secondary | ICD-10-CM | POA: Diagnosis not present

## 2023-04-11 DIAGNOSIS — R262 Difficulty in walking, not elsewhere classified: Secondary | ICD-10-CM | POA: Diagnosis not present

## 2023-04-11 DIAGNOSIS — R2681 Unsteadiness on feet: Secondary | ICD-10-CM | POA: Diagnosis not present

## 2023-04-11 NOTE — Therapy (Signed)
OUTPATIENT PHYSICAL THERAPY THORACOLUMBAR TREATMENT AND 10TH VISIT PROGRESS NOTE  Reporting Period 03/21/23 to 04/11/23   See note below for Objective Data and Assessment of Progress/Goals.    Patient Name: Jesse [name] MRN: [id number] DOB:[birth year], 82 y.o., male Today's Date: 04/11/2023  END OF SESSION:  PT End of Session - 04/11/23 1145     Visit Number 10    Date for PT Re-Evaluation 05/02/23    Authorization Type MCR A/B    Progress Note Due on Visit 10    PT Start Time 1102    PT Stop Time 1145    PT Time Calculation (min) 43 min    Activity Tolerance Patient tolerated treatment well    Behavior During Therapy WFL for tasks assessed/performed                 Past Medical History:  Diagnosis Date   Arthritis    Atrial fibrillation (HCC)    Complication of anesthesia    Atrial fibrillation during surgery    Dementia (HCC)    Depression    Hard of hearing    Hx of constipation    Osteoporosis    DEXA 11/09/10. Completed treatemtn with Fosamax.   Prostate cancer (HCC)    Restless legs    Thyroid disease    hypothyroidism   Vitiligo    Past Surgical History:  Procedure Laterality Date   BACK SURGERY     COLONOSCOPY     FLEXIBLE SIGMOIDOSCOPY N/A 09/27/2017   Procedure: FLEXIBLE SIGMOIDOSCOPY;  Surgeon: Iva Boop, MD;  Location: [facility] ENDOSCOPY;  Service: Endoscopy;  Laterality: N/A;   JOINT REPLACEMENT     LAMINECTOMY  10/2011   cervical and lumbar laminectomy   PROSTATE BIOPSY  07/10/2011   PROSTATE BIOPSY  09/22/15   TOTAL HIP ARTHROPLASTY Right 02/29/2012   TOTAL HIP ARTHROPLASTY Right 07/04/2016   Procedure: RIGHT TOTAL HIP ARTHROPLASTY ANTERIOR APPROACH;  Surgeon: Samson Frederic, MD;  Location: [facility] OR;  Service: Orthopedics;  Laterality: Right;   TRANSURETHRAL RESECTION OF PROSTATE     Patient Active Problem List   Diagnosis Date Noted   Wound drainage 09/10/2022   Vitamin B12 deficiency 08/26/2020   Restless legs 07/22/2019   History of  skin cancer 01/12/2019   Erectile dysfunction 01/12/2019   Anxiety 01/12/2019   Volvulus of sigmoid colon s/p flex sig/rectal tube 09/27/2017 09/27/2017   Mild dementia (HCC) 09/27/2017   Chronic constipation 09/27/2017   Osteoarthritis of right hip 06/05/2016   Prostate cancer (HCC) 12/07/2015   Hypothyroidism 12/07/2015    PCP: Ardith Dark, MD   REFERRING PROVIDER: Corky Sing, MD   REFERRING DIAG: [phone] (ICD-10-CM) - Other spondylosis with myelopathy, cervical region, lumbar stenosis with spondylolisthesis  Rationale for Evaluation and Treatment: Rehabilitation  THERAPY DIAG:  Muscle weakness (generalized)  Other abnormalities of gait and mobility  Abnormal posture  Difficulty in walking, not elsewhere classified  Cramp and spasm  Unsteadiness on feet  ONSET DATE: December 2023  SUBJECTIVE:  SUBJECTIVE STATEMENT: Nothing new since yesterday.Melene Plan by Jesse [name] Wife: Jesse [name]   PERTINENT HISTORY:  Cervical myelopathy, B THA,( Left anterior 2017/R posterior 2013) meningioma in brain (inoperable and growing)  PAIN:  Are you having pain? No  PRECAUTIONS: Fall  RED FLAGS: None   WEIGHT BEARING RESTRICTIONS: No  FALLS:  Has patient fallen in last 6 months? No  LIVING ENVIRONMENT: Lives with: lives with their spouse Lives in: House/apartment Stairs: Yes: Internal: 17 steps; on right going up Has following equipment at home: Single point cane, Walker - 4 wheeled, and Wheelchair (manual)  OCCUPATION: retired  PLOF: Independent  PATIENT GOALS: get back in shape  NEXT MD VISIT: none scheduled  OBJECTIVE:  Note: Objective measures were completed at Evaluation unless otherwise noted.  DIAGNOSTIC FINDINGS:  BRAIN MRI IMPRESSION: 1. No specific or reversible  explanation for memory loss. 2. 13 x 5 mm dural mass along the high left frontal convexity favoring meningioma. Follow-up could confirm expected stability.  PATIENT SURVEYS:  ABC scale 1390 / 1600 = 86.9 %  COGNITION: Overall cognitive status: Within functional limits for tasks assessed     SENSATION: Not tested  MUSCLE LENGTH: Marked HS tightness to less than 45 deg, B piriformis tightness, marked heel cord tightness  POSTURE: rounded shoulders, forward head, increased thoracic kyphosis, and flexed trunk   PALPATION: unremarkable  LUMBAR ROM:   AROM eval  Flexion   Extension neutral  Right lateral flexion   Left lateral flexion   Right rotation WFL  Left rotation WFL   (Blank rows = not tested)  LOWER EXTREMITY ROM:   lacks full knee extension B and ankle DF     Right eval Left eval  Hip flexion    Hip extension    Hip abduction    Hip adduction    Hip internal rotation    Hip external rotation    Knee flexion    Knee extension    Ankle dorsiflexion    Ankle plantarflexion    Ankle inversion    Ankle eversion     (Blank rows = not tested)    FUNCTIONAL TESTS:  5 times sit to stand: 17.5 sec Timed up and go (TUG): 13.6 Berg Balance Scale: 52/56 03/25/23 6 MWT 710 ft DGI 24/24 11/13:  TUG  11.28  GAIT: Distance walked: 20 feet Assistive device utilized: None Level of assistance: Modified independence Comments: flat footed, forward trunk lean, decreased trunk rotation, stride and step length and narrow BOS  TODAY'S TREATMENT:                                                                                                                              DATE:  04/11/23: Nu-Step L5 x 6 while discussing session plan Wall slides into flexion B shoulders palms facing in x 10 for ROM Supine cane OH flexion x 10 with 3 sec hold for ROM Standing lat bar 30# 12x  Ladder walk:  forward walk (long steps) lateral stepping, in/out of each box, diagonal stepping   SL stance clock taps using dots x 5 B SL fwd taps to colors called out by PT done B STS x 5  then with yellow band x 5 to encourage gluteus med Seated clam with yellow loop x 10 Standing hip ABD yellow loop x 5 ea challenging  04/10/23: Nu-Step L3 x 6 while discussing session plan Doorway pec stretch with arm slides up the doorframe 3 sets of 5 right/left (added to HEP) Ladder walk: forward walk (long steps) lateral stepping, in/out of each box, diagonal stepping  Walk with longer steps added cog challenge (added to HEP hallway walking with focus on longer steps and forward gaze) Clock Yourself app 30 spm simple clock to challenge single leg dynamic balance 2 min 5x STS (added to HEP)  Standing lat bar 30# 12x  Facing wall beach ball toss against the wall 10x  04/08/23 BIKE L2 x 6 Fall prevention techniques reviewed and handout provided Gait Speed tested 32.8 ft/10.14 sec = 3.23 ft/sec 2nd step hip flexor stretch cue for head up 2 x30 sec B Doorway pec stretch with arm slides up the doorframe 2 x 30 sec right/left Green band rows and shoulder extension 12 x in tandem stance 5 Hurdles next to the counter lateral step overs 4 laps , FW step overs 4 laps with intermittent single arm support (to increase step length) Resisted backward walk 15# 10x Standing lat bar 30# 12x  Facing wall red ball roll up the wall 10x Standing toe taps cones x 10 B  04/05/23 Nu-Step 5 min L5 seat 11 green machine while discussing status 2nd step hip flexor stretch cue for head up 2 x30 sec B Doorway pec stretch with arm slides up the doorframe 2 x 30 sec right/left Green band rows 12x, then 2x6 in tandem stance  5 Hurdles next to the counter lateral step overs 2 laps , FW step overs 4 laps single arm support (to increase step length) Resisted backward walk 15# 10x Standing lat bar 30# 12x (easy at first but harder toward the end) Facing wall red ball roll up the wall 5x Back to the wall: shoulder  blades against wall and keeping buttocks/hips against the wall  Walking 2 laps around the gym with upright posture and increased step length   04/04/23 Nu-Step 5 min L5 seat 11 green machine while discussing status 2nd step hip flexor stretch cue for head up Doorway pec stretch with arm slides up the doorframe 10x right/left Green band rows 12x 5 Hurdles next to the counter lateral step overs 2 laps , FW step overs 4 laps single arm support (to increase step length) Resisted backward walk 10# 10x (can increase to 15# next time) Standing lat bar 30# 12x (easy at first but harder toward the end) RPE 2/10 Facing wall red ball roll up the wall 5x Back to the wall: shoulder blades against wall and keeping buttocks/hips against the wall  Walking 2 laps around the gym with upright posture and increased step length Discussed self posture "testing" against the wall with patient and wife (see if you can get shoulder blades and buttocks touching)      PATIENT EDUCATION:  Education details: PT eval findings, anticipated POC, initial HEP, and postural awareness  Person educated: Patient and Spouse Education method: Explanation, Demonstration, and Handouts Education comprehension: verbalized understanding and returned demonstration  HOME EXERCISE PROGRAM: Access Code: [email or web address] URL: [email or web address] Date:  04/11/2023 Prepared by: Raynelle Fanning  Exercises - Tandem Stance in Corner  - 1 x daily - 3-4 x weekly - 1 sets - 10 reps - Single Leg Stance  - 1 x daily - 3-4 x weekly - 1 sets - 10 reps - max hold - Gastroc Stretch on Wall  - 2 x daily - 7 x weekly - 1 sets - 2 reps - 30 hold - Seated Thoracic Extension Arms Overhead  - 1 x daily - 7 x weekly - 2 sets - 5 reps - 2-3 sec hold - Seated Trunk Rotation  - 1 x daily - 7 x weekly - 2 sets - 5 reps - 2-3 sec hold - Seated Diagonal Chops with Medicine Ball  - 1 x daily - 7 x weekly - 2 sets - 5 reps - 2-3 hold - Seated Long Arc Quad   - 2 x daily - 7 x weekly - 1-2 sets - 10 reps - Doorway Pec Stretch at 90 Degrees Abduction  - 1 x daily - 7 x weekly - 3 sets - 5 reps - Sit to Stand with Arms Crossed  - 1 x daily - 7 x weekly - 1 sets - 10 reps - Walking  - 1 x daily - 7 x weekly - 1 sets - 10 reps - Supine Shoulder Flexion Extension AAROM with Dowel  - 2 x daily - 7 x weekly - 2 sets - 10 reps - 3 sec hold - Standing shoulder flexion wall slides  - 2 x daily - 7 x weekly - 2 sets - 10 repst  ASSESSMENT:  CLINICAL IMPRESSION: Braydan is progressing well since his evaluation. He demonstrates increased tolerance to exercise and reports increased endurance and energy at home. He find OH activities somewhat challenging, so we worked on shoulder flexion ROM today in addition to ongoing strength and balance. He is very challenged with added when cognitive component added to ex's.  CGA provided for safety with balance ex but no physical assistance needed.  He demonstrated difficulty with standing resistedhip ABD and would benefit from more targeted gluteus med strengthening. His TUG improved by 2 seconds yesterday and he is close to meeting that STG. Cassie continues to demonstrate potential for improvement and would benefit from continued skilled therapy to address impairments.      Eval: Patient is a 82 y.o. male who was seen today for physical therapy evaluation and treatment for gait and posture abnormalities secondary to myelopathy and lumbar spondylolisthesis. He denies pain and reports he is here because the doctor sent him. His wife reports that she is concerned with his gait. His gait pattern presents with little to no heel strike, forward trunk lean, decreased trunk rotation, stride and step length and narrow BOS. His BERG indicates he is at lower risk for falls (25%) and he is challenged mainly by higher level balance like tandem stance and SLS. His TUG and 5XSTS are outside the norms for his age and indicate he is at risk for falls.  He will benefit from skilled PT to address these deficits.    OBJECTIVE IMPAIRMENTS: Abnormal gait, decreased balance, decreased ROM, decreased strength, impaired flexibility, and postural dysfunction.   ACTIVITY LIMITATIONS: squatting and locomotion level  PARTICIPATION LIMITATIONS: shopping  PERSONAL FACTORS: Age and 3+ comorbidities: Cervical myelopathy, B THA, brain meningioma  are also affecting patient's functional outcome.   REHAB POTENTIAL: Good  CLINICAL DECISION MAKING: Evolving/moderate complexity  EVALUATION COMPLEXITY: Moderate   GOALS: Goals reviewed  with patient? Yes  SHORT TERM GOALS: Target date: 04/11/2023   Patient will be independent with initial HEP. Baseline:  Goal status: MET 04/03/23  2.  Patient will demonstrate decreased fall risk by scoring < 11 sec on TUG. Baseline: 13.6 sec Goal status: partially met 11/13 11.28  3.  Patient will be educated on strategies to decrease risk of falls.  Baseline:  Goal status: MET 04/08/23   LONG TERM GOALS: Target date: 05/02/2023  Patient will be independent with advanced/ongoing HEP to improve outcomes and carryover.  Baseline:  Goal status: INITIAL  2.  Patient will be able to ambulate 600' with LRAD with good safety to access community.  Baseline:  Goal status: MET  3.  Patient will demonstrate improved functional LE strength as demonstrated by improved 5XSTS to 14.8 sec. Baseline: 17.5 sec Goal status: INITIAL  4.  Patient will demonstrate at least 19/24 on DGI to improve gait stability and reduce risk for falls. Baseline:  Goal status: MET  5.  Patient will score 56/56 on Berg Balance test to demonstrate lower risk of falls.  Baseline: 52/56 Goal status: INITIAL  8.  Patient will demonstrate gait speed of >/= 1.8 ft/sec (0.55 m/s) to be a safe limited community ambulator with decreased risk for recurrent falls.  Baseline:  Goal status: MET 3.54ft/sec 04/08/23   PLAN:  PT FREQUENCY:  3x/week  PT DURATION: 6 weeks  PLANNED INTERVENTIONS: 97110-Therapeutic exercises, 97530- Therapeutic activity, 97112- Neuromuscular re-education, 97535- Self Care, 16109- Manual therapy, [phone]- Gait training, [phone]- Aquatic Therapy, Patient/Family education, Balance training, Dry Needling, Joint mobilization, Spinal mobilization, Cryotherapy, and Moist heat.  PLAN FOR NEXT SESSION:  Continue SLS activities and targeted gluteus med strength, high level balance with cognitive challenges, postural strengthening,  posture with gait.  Solon Palm, PT  04/11/23 11:46 AM Phone: [phone] Fax: [phone]  [facility] [address], [address] [place], [place] [address] Phone # [phone] Fax [phone]

## 2023-04-14 NOTE — Therapy (Signed)
OUTPATIENT PHYSICAL THERAPY THORACOLUMBAR TREATMENT     Patient Name: Jesse [name] MRN: [id number] DOB:06-02-1940, 82 y.o., male Today's Date: 04/15/2023  END OF SESSION:  PT End of Session - 04/15/23 1112     Visit Number 11    Date for PT Re-Evaluation 05/02/23    Authorization Type MCR A/B    Progress Note Due on Visit 20    PT Start Time 1110    PT Stop Time 1158    PT Time Calculation (min) 48 min    Activity Tolerance Patient tolerated treatment well    Behavior During Therapy WFL for tasks assessed/performed              Past Medical History:  Diagnosis Date   Arthritis    Atrial fibrillation (HCC)    Complication of anesthesia    Atrial fibrillation during surgery    Dementia (HCC)    Depression    Hard of hearing    Hx of constipation    Osteoporosis    DEXA 11/09/10. Completed treatemtn with Fosamax.   Prostate cancer (HCC)    Restless legs    Thyroid disease    hypothyroidism   Vitiligo    Past Surgical History:  Procedure Laterality Date   BACK SURGERY     COLONOSCOPY     FLEXIBLE SIGMOIDOSCOPY N/A 09/27/2017   Procedure: FLEXIBLE SIGMOIDOSCOPY;  Surgeon: Jesse Boop, MD;  Location: [facility] ENDOSCOPY;  Service: Endoscopy;  Laterality: N/A;   JOINT REPLACEMENT     LAMINECTOMY  10/2011   cervical and lumbar laminectomy   PROSTATE BIOPSY  07/10/2011   PROSTATE BIOPSY  09/22/15   TOTAL HIP ARTHROPLASTY Right 02/29/2012   TOTAL HIP ARTHROPLASTY Right 07/04/2016   Procedure: RIGHT TOTAL HIP ARTHROPLASTY ANTERIOR APPROACH;  Surgeon: Jesse Frederic, MD;  Location: [facility] OR;  Service: Orthopedics;  Laterality: Right;   TRANSURETHRAL RESECTION OF PROSTATE     Patient Active Problem List   Diagnosis Date Noted   Wound drainage 09/10/2022   Vitamin B12 deficiency 08/26/2020   Restless legs 07/22/2019   History of skin cancer 01/12/2019   Erectile dysfunction 01/12/2019   Anxiety 01/12/2019   Volvulus of sigmoid colon s/p flex sig/rectal tube 09/27/2017  09/27/2017   Mild dementia (HCC) 09/27/2017   Chronic constipation 09/27/2017   Osteoarthritis of right hip 06/05/2016   Prostate cancer (HCC) 12/07/2015   Hypothyroidism 12/07/2015    PCP: Jesse Dark, MD   REFERRING PROVIDER: Corky Sing, MD   REFERRING DIAG: [phone] (ICD-10-CM) - Other spondylosis with myelopathy, cervical region, lumbar stenosis with spondylolisthesis  Rationale for Evaluation and Treatment: Rehabilitation  THERAPY DIAG:  Muscle weakness (generalized)  Other abnormalities of gait and mobility  Abnormal posture  Difficulty in walking, not elsewhere classified  Cramp and spasm  Unsteadiness on feet  ONSET DATE: December 2023  SUBJECTIVE:  SUBJECTIVE STATEMENT: Nothing new to report  Goes by Jesse [name] Wife: Jesse [name]   PERTINENT HISTORY:  Cervical myelopathy, B THA,( Left anterior 2017/R posterior 2013) meningioma in brain (inoperable and growing)  PAIN:  Are you having pain? No  PRECAUTIONS: Fall  RED FLAGS: None   WEIGHT BEARING RESTRICTIONS: No  FALLS:  Has patient fallen in last 6 months? No  LIVING ENVIRONMENT: Lives with: lives with their spouse Lives in: House/apartment Stairs: Yes: Internal: 17 steps; on right going up Has following equipment at home: Single point cane, Walker - 4 wheeled, and Wheelchair (manual)  OCCUPATION: retired  PLOF: Independent  PATIENT GOALS: get back in shape  NEXT MD VISIT: none scheduled  OBJECTIVE:  Note: Objective measures were completed at Evaluation unless otherwise noted.  DIAGNOSTIC FINDINGS:  BRAIN MRI IMPRESSION: 1. No specific or reversible explanation for memory loss. 2. 13 x 5 mm dural mass along the high left frontal convexity favoring meningioma. Follow-up could confirm expected  stability.  PATIENT SURVEYS:  ABC scale 1390 / 1600 = 86.9 %  COGNITION: Overall cognitive status: Within functional limits for tasks assessed     SENSATION: Not tested  MUSCLE LENGTH: Marked HS tightness to less than 45 deg, B piriformis tightness, marked heel cord tightness  POSTURE: rounded shoulders, forward head, increased thoracic kyphosis, and flexed trunk   PALPATION: unremarkable  LUMBAR ROM:   AROM eval  Flexion   Extension neutral  Right lateral flexion   Left lateral flexion   Right rotation WFL  Left rotation WFL   (Blank rows = not tested)  LOWER EXTREMITY ROM:   lacks full knee extension B and ankle DF     Right eval Left eval  Hip flexion    Hip extension    Hip abduction    Hip adduction    Hip internal rotation    Hip external rotation    Knee flexion    Knee extension    Ankle dorsiflexion    Ankle plantarflexion    Ankle inversion    Ankle eversion     (Blank rows = not tested)    FUNCTIONAL TESTS:  5 times sit to stand: 17.5 sec Timed up and go (TUG): 13.6 Berg Balance Scale: 52/56 03/25/23 6 MWT 710 ft DGI 24/24 11/13:  TUG  11.28  04/15/23   5XSTS 19.29 sec  GAIT: Distance walked: 20 feet Assistive device utilized: None Level of assistance: Modified independence Comments: flat footed, forward trunk lean, decreased trunk rotation, stride and step length and narrow BOS  TODAY'S TREATMENT:                                                                                                                              DATE:   04/15/23: Bike L5 x 5  Wall slides into flexion B shoulders palms facing using foam roller in x 10 for ROM, Standing with back to wall OH flexion toward wall for thoracic ext  and shoulder ROM Single arm wall slides flexion x 5 ea Standing lat bar 30# 12x  STS with yellow band x 10 to encourage gluteus med from chair + foam Seated clam with yellow loop 2 x 10 Standing hip ABD yellow loop 2 x 10 ea  challenging Supine piriformis stretch 2x 30 sec B Ladder walk: forward walk (long steps) lateral stepping, in/out of each box, diagonal stepping  Walking with big full steps and having patient name countries  - did well Tandem walking length of hallway 4 corners CW/CCW, then with color call outs.  04/11/23: Nu-Step L5 x 6 while discussing session plan Wall slides into flexion B shoulders palms facing in x 10 for ROM Supine cane OH flexion x 10 with 3 sec hold for ROM Standing lat bar 30# 12x  Ladder walk: forward walk (long steps) lateral stepping, in/out of each box, diagonal stepping  SL stance clock taps using dots x 5 B SL fwd taps to colors called out by PT done B STS x 5  then with yellow band x 5 to encourage gluteus med Seated clam with yellow loop x 10 Standing hip ABD yellow loop x 5 ea challenging  04/10/23: Nu-Step L3 x 6 while discussing session plan Doorway pec stretch with arm slides up the doorframe 3 sets of 5 right/left (added to HEP) Ladder walk: forward walk (long steps) lateral stepping, in/out of each box, diagonal stepping  Walk with longer steps added cog challenge (added to HEP hallway walking with focus on longer steps and forward gaze) Clock Yourself app 30 spm simple clock to challenge single leg dynamic balance 2 min 5x STS (added to HEP)  Standing lat bar 30# 12x  Facing wall beach ball toss against the wall 10x      PATIENT EDUCATION:  Education details: PT eval findings, anticipated POC, initial HEP, and postural awareness  Person educated: Patient and Spouse Education method: Explanation, Demonstration, and Handouts Education comprehension: verbalized understanding and returned demonstration  HOME EXERCISE PROGRAM: Access Code: [email or web address] URL: [email or web address] Date: 04/11/2023 Prepared by: Raynelle Fanning  Exercises - Tandem Stance in Corner  - 1 x daily - 3-4 x weekly - 1 sets - 10 reps - Single Leg Stance  - 1 x daily - 3-4 x  weekly - 1 sets - 10 reps - max hold - Gastroc Stretch on Wall  - 2 x daily - 7 x weekly - 1 sets - 2 reps - 30 hold - Seated Thoracic Extension Arms Overhead  - 1 x daily - 7 x weekly - 2 sets - 5 reps - 2-3 sec hold - Seated Trunk Rotation  - 1 x daily - 7 x weekly - 2 sets - 5 reps - 2-3 sec hold - Seated Diagonal Chops with Medicine Ball  - 1 x daily - 7 x weekly - 2 sets - 5 reps - 2-3 hold - Seated Long Arc Quad  - 2 x daily - 7 x weekly - 1-2 sets - 10 reps - Doorway Pec Stretch at 90 Degrees Abduction  - 1 x daily - 7 x weekly - 3 sets - 5 reps - Sit to Stand with Arms Crossed  - 1 x daily - 7 x weekly - 1 sets - 10 reps - Walking  - 1 x daily - 7 x weekly - 1 sets - 10 reps - Supine Shoulder Flexion Extension AAROM with Dowel  - 2 x daily - 7 x weekly - 2 sets -  10 reps - 3 sec hold - Standing shoulder flexion wall slides  - 2 x daily - 7 x weekly - 2 sets - 10 repst  ASSESSMENT:  CLINICAL IMPRESSION: Mohamad is improving with agility ladder activities and would likely be challenged with add on of cognitive activities. He did well with walking + cognitive challenges and was able to maintain step length while listing countries. Tandem walking was normal today. He may benefit from some turning activities. He still demonstrates glute med weakness and his STS is still challenging. His 5XSTS was 19.29 sec today which is longer than his baseline at evaluation, but it was done at the end of the session and he was likely fatigued.     Eval: Patient is a 82 y.o. male who was seen today for physical therapy evaluation and treatment for gait and posture abnormalities secondary to myelopathy and lumbar spondylolisthesis. He denies pain and reports he is here because the doctor sent him. His wife reports that she is concerned with his gait. His gait pattern presents with little to no heel strike, forward trunk lean, decreased trunk rotation, stride and step length and narrow BOS. His BERG indicates he is  at lower risk for falls (25%) and he is challenged mainly by higher level balance like tandem stance and SLS. His TUG and 5XSTS are outside the norms for his age and indicate he is at risk for falls. He will benefit from skilled PT to address these deficits.    OBJECTIVE IMPAIRMENTS: Abnormal gait, decreased balance, decreased ROM, decreased strength, impaired flexibility, and postural dysfunction.   ACTIVITY LIMITATIONS: squatting and locomotion level  PARTICIPATION LIMITATIONS: shopping  PERSONAL FACTORS: Age and 3+ comorbidities: Cervical myelopathy, B THA, brain meningioma  are also affecting patient's functional outcome.   REHAB POTENTIAL: Good  CLINICAL DECISION MAKING: Evolving/moderate complexity  EVALUATION COMPLEXITY: Moderate   GOALS: Goals reviewed with patient? Yes  SHORT TERM GOALS: Target date: 04/11/2023   Patient will be independent with initial HEP. Baseline:  Goal status: MET 04/03/23  2.  Patient will demonstrate decreased fall risk by scoring < 11 sec on TUG. Baseline: 13.6 sec Goal status: partially met 11/13 11.28  3.  Patient will be educated on strategies to decrease risk of falls.  Baseline:  Goal status: MET 04/08/23   LONG TERM GOALS: Target date: 05/02/2023  Patient will be independent with advanced/ongoing HEP to improve outcomes and carryover.  Baseline:  Goal status: INITIAL  2.  Patient will be able to ambulate 600' with LRAD with good safety to access community.  Baseline:  Goal status: MET  3.  Patient will demonstrate improved functional LE strength as demonstrated by improved 5XSTS to 14.8 sec. Baseline: 17.5 sec Goal status: INITIAL  4.  Patient will demonstrate at least 19/24 on DGI to improve gait stability and reduce risk for falls. Baseline:  Goal status: MET  5.  Patient will score 56/56 on Berg Balance test to demonstrate lower risk of falls.  Baseline: 52/56 Goal status: INITIAL  8.  Patient will demonstrate gait  speed of >/= 1.8 ft/sec (0.55 m/s) to be a safe limited community ambulator with decreased risk for recurrent falls.  Baseline:  Goal status: MET 3.57ft/sec 04/08/23   PLAN:  PT FREQUENCY: 3x/week  PT DURATION: 6 weeks  PLANNED INTERVENTIONS: 97110-Therapeutic exercises, 97530- Therapeutic activity, 97112- Neuromuscular re-education, 97535- Self Care, 56433- Manual therapy, [phone]- Gait training, [phone]- Aquatic Therapy, Patient/Family education, Balance training, Dry Needling, Joint mobilization, Spinal mobilization, Cryotherapy,  and Moist heat.  PLAN FOR NEXT SESSION:  Continue SLS activities and targeted gluteus med strength, high level balance with cognitive challenges, postural strengthening,  posture with gait.  Solon Palm, PT  04/15/23 12:08 PM Phone: [phone] Fax: [phone]  [facility] [address] [place], [place] [address] Phone # [phone] Fax [phone]

## 2023-04-15 ENCOUNTER — Encounter: Payer: Self-pay | Admitting: Physical Therapy

## 2023-04-15 ENCOUNTER — Ambulatory Visit: Payer: Medicare Other | Admitting: Physical Therapy

## 2023-04-15 DIAGNOSIS — R293 Abnormal posture: Secondary | ICD-10-CM | POA: Diagnosis not present

## 2023-04-15 DIAGNOSIS — R252 Cramp and spasm: Secondary | ICD-10-CM | POA: Diagnosis not present

## 2023-04-15 DIAGNOSIS — R2681 Unsteadiness on feet: Secondary | ICD-10-CM | POA: Diagnosis not present

## 2023-04-15 DIAGNOSIS — R262 Difficulty in walking, not elsewhere classified: Secondary | ICD-10-CM

## 2023-04-15 DIAGNOSIS — R2689 Other abnormalities of gait and mobility: Secondary | ICD-10-CM | POA: Diagnosis not present

## 2023-04-15 DIAGNOSIS — M6281 Muscle weakness (generalized): Secondary | ICD-10-CM

## 2023-04-17 ENCOUNTER — Ambulatory Visit: Payer: Medicare Other

## 2023-04-17 DIAGNOSIS — R2681 Unsteadiness on feet: Secondary | ICD-10-CM

## 2023-04-17 DIAGNOSIS — M6281 Muscle weakness (generalized): Secondary | ICD-10-CM

## 2023-04-17 DIAGNOSIS — R293 Abnormal posture: Secondary | ICD-10-CM | POA: Diagnosis not present

## 2023-04-17 DIAGNOSIS — R252 Cramp and spasm: Secondary | ICD-10-CM | POA: Diagnosis not present

## 2023-04-17 DIAGNOSIS — R262 Difficulty in walking, not elsewhere classified: Secondary | ICD-10-CM

## 2023-04-17 DIAGNOSIS — R2689 Other abnormalities of gait and mobility: Secondary | ICD-10-CM | POA: Diagnosis not present

## 2023-04-17 NOTE — Therapy (Signed)
OUTPATIENT PHYSICAL THERAPY THORACOLUMBAR TREATMENT     Patient Name: Jesse [name] MRN: [id number] DOB:04/12/1941, 82 y.o., male Today's Date: 04/17/2023  END OF SESSION:  PT End of Session - 04/17/23 1241     Visit Number 12    Date for PT Re-Evaluation 05/02/23    Authorization Type MCR A/B    Progress Note Due on Visit 20    PT Start Time 1230    PT Stop Time 1310    PT Time Calculation (min) 40 min    Activity Tolerance Patient tolerated treatment well    Behavior During Therapy WFL for tasks assessed/performed              Past Medical History:  Diagnosis Date   Arthritis    Atrial fibrillation (HCC)    Complication of anesthesia    Atrial fibrillation during surgery    Dementia (HCC)    Depression    Hard of hearing    Hx of constipation    Osteoporosis    DEXA 11/09/10. Completed treatemtn with Fosamax.   Prostate cancer (HCC)    Restless legs    Thyroid disease    hypothyroidism   Vitiligo    Past Surgical History:  Procedure Laterality Date   BACK SURGERY     COLONOSCOPY     FLEXIBLE SIGMOIDOSCOPY N/A 09/27/2017   Procedure: FLEXIBLE SIGMOIDOSCOPY;  Surgeon: Iva Boop, MD;  Location: [facility] ENDOSCOPY;  Service: Endoscopy;  Laterality: N/A;   JOINT REPLACEMENT     LAMINECTOMY  10/2011   cervical and lumbar laminectomy   PROSTATE BIOPSY  07/10/2011   PROSTATE BIOPSY  09/22/15   TOTAL HIP ARTHROPLASTY Right 02/29/2012   TOTAL HIP ARTHROPLASTY Right 07/04/2016   Procedure: RIGHT TOTAL HIP ARTHROPLASTY ANTERIOR APPROACH;  Surgeon: Samson Frederic, MD;  Location: [facility] OR;  Service: Orthopedics;  Laterality: Right;   TRANSURETHRAL RESECTION OF PROSTATE     Patient Active Problem List   Diagnosis Date Noted   Wound drainage 09/10/2022   Vitamin B12 deficiency 08/26/2020   Restless legs 07/22/2019   History of skin cancer 01/12/2019   Erectile dysfunction 01/12/2019   Anxiety 01/12/2019   Volvulus of sigmoid colon s/p flex sig/rectal tube 09/27/2017  09/27/2017   Mild dementia (HCC) 09/27/2017   Chronic constipation 09/27/2017   Osteoarthritis of right hip 06/05/2016   Prostate cancer (HCC) 12/07/2015   Hypothyroidism 12/07/2015    PCP: Ardith Dark, MD   REFERRING PROVIDER: Corky Sing, MD   REFERRING DIAG: [phone] (ICD-10-CM) - Other spondylosis with myelopathy, cervical region, lumbar stenosis with spondylolisthesis  Rationale for Evaluation and Treatment: Rehabilitation  THERAPY DIAG:  Muscle weakness (generalized)  Cramp and spasm  Abnormal posture  Difficulty in walking, not elsewhere classified  Unsteadiness on feet  ONSET DATE: December 2023  SUBJECTIVE:  SUBJECTIVE STATEMENT: Getting ready to drive 8 hours to go visit my son.  We are looking for a home near him.   Goes by Jesse Ruiz Wife: Jesse [name]   PERTINENT HISTORY:  Cervical myelopathy, B THA,( Left anterior 2017/R posterior 2013) meningioma in brain (inoperable and growing)  PAIN:  Are you having pain? No  PRECAUTIONS: Fall  RED FLAGS: None   WEIGHT BEARING RESTRICTIONS: No  FALLS:  Has patient fallen in last 6 months? No  LIVING ENVIRONMENT: Lives with: lives with their spouse Lives in: House/apartment Stairs: Yes: Internal: 17 steps; on right going up Has following equipment at home: Single point cane, Walker - 4 wheeled, and Wheelchair (manual)  OCCUPATION: retired  PLOF: Independent  PATIENT GOALS: get back in shape  NEXT MD VISIT: none scheduled  OBJECTIVE:  Note: Objective measures were completed at Evaluation unless otherwise noted.  DIAGNOSTIC FINDINGS:  BRAIN MRI IMPRESSION: 1. No specific or reversible explanation for memory loss. 2. 13 x 5 mm dural mass along the high left frontal convexity favoring meningioma. Follow-up could  confirm expected stability.  PATIENT SURVEYS:  ABC scale 1390 / 1600 = 86.9 %  COGNITION: Overall cognitive status: Within functional limits for tasks assessed     SENSATION: Not tested  MUSCLE LENGTH: Marked HS tightness to less than 45 deg, B piriformis tightness, marked heel cord tightness  POSTURE: rounded shoulders, forward head, increased thoracic kyphosis, and flexed trunk   PALPATION: unremarkable  LUMBAR ROM:   AROM eval  Flexion   Extension neutral  Right lateral flexion   Left lateral flexion   Right rotation WFL  Left rotation WFL   (Blank rows = not tested)  LOWER EXTREMITY ROM:   lacks full knee extension B and ankle DF     Right eval Left eval  Hip flexion    Hip extension    Hip abduction    Hip adduction    Hip internal rotation    Hip external rotation    Knee flexion    Knee extension    Ankle dorsiflexion    Ankle plantarflexion    Ankle inversion    Ankle eversion     (Blank rows = not tested)    FUNCTIONAL TESTS:  5 times sit to stand: 17.5 sec Timed up and go (TUG): 13.6 Berg Balance Scale: 52/56 03/25/23 6 MWT 710 ft DGI 24/24 11/13:  TUG  11.28  04/15/23   5XSTS 19.29 sec  GAIT: Distance walked: 20 feet Assistive device utilized: None Level of assistance: Modified independence Comments: flat footed, forward trunk lean, decreased trunk rotation, stride and step length and narrow BOS  TODAY'S TREATMENT:                                                                                                                              DATE:  04/17/23: Nustep L5 x 5  Wall slides into flexion B shoulders palms facing using foam roller in  x 10 for ROM, Standing with back to wall OH flexion toward wall for thoracic ext and shoulder ROM Seated clam with yellow loop 2 x 10 STS with yellow band x 10 to encourage gluteus med from chair + foam Standing lat bar 30# 12x  At steps: left foot on 2nd step: overhead reach into left lateral  trunk flexion Hurdle walk: forward walk step to then step through 2 laps of each Hurdle walk:  lateral stepping x 2 laps Walking with big full steps and having patient name countries  - did well Tandem walking length of hallway Side stepping : length of hallway  04/15/23: Bike L5 x 5  Wall slides into flexion B shoulders palms facing using foam roller in x 10 for ROM, Standing with back to wall OH flexion toward wall for thoracic ext and shoulder ROM Single arm wall slides flexion x 5 ea Standing lat bar 30# 12x  STS with yellow band x 10 to encourage gluteus med from chair + foam Seated clam with yellow loop 2 x 10 Standing hip ABD yellow loop 2 x 10 ea challenging Supine piriformis stretch 2x 30 sec B Ladder walk: forward walk (long steps) lateral stepping, in/out of each box, diagonal stepping  Walking with big full steps and having patient name countries  - did well Tandem walking length of hallway 4 corners CW/CCW, then with color call outs.  04/11/23: Nu-Step L5 x 6 while discussing session plan Wall slides into flexion B shoulders palms facing in x 10 for ROM Supine cane OH flexion x 10 with 3 sec hold for ROM Standing lat bar 30# 12x  Ladder walk: forward walk (long steps) lateral stepping, in/out of each box, diagonal stepping  SL stance clock taps using dots x 5 B SL fwd taps to colors called out by PT done B STS x 5  then with yellow band x 5 to encourage gluteus med Seated clam with yellow loop x 10 Standing hip ABD yellow loop x 5 ea challenging   PATIENT EDUCATION:  Education details: PT eval findings, anticipated POC, initial HEP, and postural awareness  Person educated: Patient and Spouse Education method: Explanation, Demonstration, and Handouts Education comprehension: verbalized understanding and returned demonstration  HOME EXERCISE PROGRAM: Access Code: [email or web address] URL: [email or web address] Date: 04/11/2023 Prepared by:  Raynelle Fanning  Exercises - Tandem Stance in Corner  - 1 x daily - 3-4 x weekly - 1 sets - 10 reps - Single Leg Stance  - 1 x daily - 3-4 x weekly - 1 sets - 10 reps - max hold - Gastroc Stretch on Wall  - 2 x daily - 7 x weekly - 1 sets - 2 reps - 30 hold - Seated Thoracic Extension Arms Overhead  - 1 x daily - 7 x weekly - 2 sets - 5 reps - 2-3 sec hold - Seated Trunk Rotation  - 1 x daily - 7 x weekly - 2 sets - 5 reps - 2-3 sec hold - Seated Diagonal Chops with Medicine Ball  - 1 x daily - 7 x weekly - 2 sets - 5 reps - 2-3 hold - Seated Long Arc Quad  - 2 x daily - 7 x weekly - 1-2 sets - 10 reps - Doorway Pec Stretch at 90 Degrees Abduction  - 1 x daily - 7 x weekly - 3 sets - 5 reps - Sit to Stand with Arms Crossed  - 1 x daily - 7 x weekly - 1 sets -  10 reps - Walking  - 1 x daily - 7 x weekly - 1 sets - 10 reps - Supine Shoulder Flexion Extension AAROM with Dowel  - 2 x daily - 7 x weekly - 2 sets - 10 reps - 3 sec hold - Standing shoulder flexion wall slides  - 2 x daily - 7 x weekly - 2 sets - 10 repst  ASSESSMENT:  CLINICAL IMPRESSION: Jesse [name] is progressing appropriately.  He welcomes challenge.  He continues to need reminders to pursue increased step length and upright posture.  He had 2 losses of balance with balance and functional stepping over hurdles but this was due to him trying to do this without UE support.  He is well motivated and should continue to do well.      Eval: Patient is a 82 y.o. male who was seen today for physical therapy evaluation and treatment for gait and posture abnormalities secondary to myelopathy and lumbar spondylolisthesis. He denies pain and reports he is here because the doctor sent him. His wife reports that she is concerned with his gait. His gait pattern presents with little to no heel strike, forward trunk lean, decreased trunk rotation, stride and step length and narrow BOS. His BERG indicates he is at lower risk for falls (25%) and he is challenged  mainly by higher level balance like tandem stance and SLS. His TUG and 5XSTS are outside the norms for his age and indicate he is at risk for falls. He will benefit from skilled PT to address these deficits.    OBJECTIVE IMPAIRMENTS: Abnormal gait, decreased balance, decreased ROM, decreased strength, impaired flexibility, and postural dysfunction.   ACTIVITY LIMITATIONS: squatting and locomotion level  PARTICIPATION LIMITATIONS: shopping  PERSONAL FACTORS: Age and 3+ comorbidities: Cervical myelopathy, B THA, brain meningioma  are also affecting patient's functional outcome.   REHAB POTENTIAL: Good  CLINICAL DECISION MAKING: Evolving/moderate complexity  EVALUATION COMPLEXITY: Moderate   GOALS: Goals reviewed with patient? Yes  SHORT TERM GOALS: Target date: 04/11/2023   Patient will be independent with initial HEP. Baseline:  Goal status: MET 04/03/23  2.  Patient will demonstrate decreased fall risk by scoring < 11 sec on TUG. Baseline: 13.6 sec Goal status: partially met 11/13 11.28  3.  Patient will be educated on strategies to decrease risk of falls.  Baseline:  Goal status: MET 04/08/23   LONG TERM GOALS: Target date: 05/02/2023  Patient will be independent with advanced/ongoing HEP to improve outcomes and carryover.  Baseline:  Goal status: INITIAL  2.  Patient will be able to ambulate 600' with LRAD with good safety to access community.  Baseline:  Goal status: MET  3.  Patient will demonstrate improved functional LE strength as demonstrated by improved 5XSTS to 14.8 sec. Baseline: 17.5 sec Goal status: INITIAL  4.  Patient will demonstrate at least 19/24 on DGI to improve gait stability and reduce risk for falls. Baseline:  Goal status: MET  5.  Patient will score 56/56 on Berg Balance test to demonstrate lower risk of falls.  Baseline: 52/56 Goal status: INITIAL  8.  Patient will demonstrate gait speed of >/= 1.8 ft/sec (0.55 m/s) to be a safe  limited community ambulator with decreased risk for recurrent falls.  Baseline:  Goal status: MET 3.19ft/sec 04/08/23   PLAN:  PT FREQUENCY: 3x/week  PT DURATION: 6 weeks  PLANNED INTERVENTIONS: 97110-Therapeutic exercises, 97530- Therapeutic activity, [id number]- Neuromuscular re-education, 97535- Self Care, [id number]- Manual therapy, [id number]- Gait training, [id number]- Aquatic  Therapy, Patient/Family education, Balance training, Dry Needling, Joint mobilization, Spinal mobilization, Cryotherapy, and Moist heat.  PLAN FOR NEXT SESSION:  Continue SLS activities and targeted gluteus med strength, high level balance with cognitive challenges, postural strengthening,  posture with gait.  Victorino Dike B. Lilliah Priego, PT 04/17/23 8:24 PM [facility] [address], [address] [place], [place] [address] Phone # [phone] Fax [phone]

## 2023-04-18 ENCOUNTER — Encounter: Payer: Self-pay | Admitting: Physical Therapy

## 2023-04-18 ENCOUNTER — Ambulatory Visit: Payer: Medicare Other | Admitting: Physical Therapy

## 2023-04-18 DIAGNOSIS — M6281 Muscle weakness (generalized): Secondary | ICD-10-CM

## 2023-04-18 DIAGNOSIS — R2689 Other abnormalities of gait and mobility: Secondary | ICD-10-CM

## 2023-04-18 DIAGNOSIS — R293 Abnormal posture: Secondary | ICD-10-CM | POA: Diagnosis not present

## 2023-04-18 DIAGNOSIS — R252 Cramp and spasm: Secondary | ICD-10-CM | POA: Diagnosis not present

## 2023-04-18 DIAGNOSIS — R2681 Unsteadiness on feet: Secondary | ICD-10-CM

## 2023-04-18 DIAGNOSIS — R262 Difficulty in walking, not elsewhere classified: Secondary | ICD-10-CM

## 2023-04-18 NOTE — Therapy (Signed)
OUTPATIENT PHYSICAL THERAPY THORACOLUMBAR TREATMENT     Patient Name: Jesse [name] MRN: [id number] DOB:[birth year], 82 y.o., male Today's Date: 04/18/2023  END OF SESSION:  PT End of Session - 04/18/23 1058     Visit Number 13    Date for PT Re-Evaluation 05/02/23    Authorization Type MCR A/B    Progress Note Due on Visit 20    PT Start Time 1058    PT Stop Time 1140    PT Time Calculation (min) 42 min    Activity Tolerance Patient tolerated treatment well    Behavior During Therapy WFL for tasks assessed/performed               Past Medical History:  Diagnosis Date   Arthritis    Atrial fibrillation (HCC)    Complication of anesthesia    Atrial fibrillation during surgery    Dementia (HCC)    Depression    Hard of hearing    Hx of constipation    Osteoporosis    DEXA 11/09/10. Completed treatemtn with Fosamax.   Prostate cancer (HCC)    Restless legs    Thyroid disease    hypothyroidism   Vitiligo    Past Surgical History:  Procedure Laterality Date   BACK SURGERY     COLONOSCOPY     FLEXIBLE SIGMOIDOSCOPY N/A 09/27/2017   Procedure: FLEXIBLE SIGMOIDOSCOPY;  Surgeon: Iva Boop, MD;  Location: [facility] ENDOSCOPY;  Service: Endoscopy;  Laterality: N/A;   JOINT REPLACEMENT     LAMINECTOMY  10/2011   cervical and lumbar laminectomy   PROSTATE BIOPSY  07/10/2011   PROSTATE BIOPSY  09/22/15   TOTAL HIP ARTHROPLASTY Right 02/29/2012   TOTAL HIP ARTHROPLASTY Right 07/04/2016   Procedure: RIGHT TOTAL HIP ARTHROPLASTY ANTERIOR APPROACH;  Surgeon: Samson Frederic, MD;  Location: [facility] OR;  Service: Orthopedics;  Laterality: Right;   TRANSURETHRAL RESECTION OF PROSTATE     Patient Active Problem List   Diagnosis Date Noted   Wound drainage 09/10/2022   Vitamin B12 deficiency 08/26/2020   Restless legs 07/22/2019   History of skin cancer 01/12/2019   Erectile dysfunction 01/12/2019   Anxiety 01/12/2019   Volvulus of sigmoid colon s/p flex sig/rectal tube 09/27/2017  09/27/2017   Mild dementia (HCC) 09/27/2017   Chronic constipation 09/27/2017   Osteoarthritis of right hip 06/05/2016   Prostate cancer (HCC) 12/07/2015   Hypothyroidism 12/07/2015    PCP: Ardith Dark, MD   REFERRING PROVIDER: Corky Sing, MD   REFERRING DIAG: [phone] (ICD-10-CM) - Other spondylosis with myelopathy, cervical region, lumbar stenosis with spondylolisthesis  Rationale for Evaluation and Treatment: Rehabilitation  THERAPY DIAG:  Muscle weakness (generalized)  Cramp and spasm  Abnormal posture  Difficulty in walking, not elsewhere classified  Unsteadiness on feet  Other abnormalities of gait and mobility  ONSET DATE: December 2023  SUBJECTIVE:  SUBJECTIVE STATEMENT: Patient reports he feels like he is improving with therapy, but thinks he needs more.  Goes by Jesse Ruiz Wife: Jesse [name]   PERTINENT HISTORY:  Cervical myelopathy, B THA,( Left anterior 2017/R posterior 2013) meningioma in brain (inoperable and growing)  PAIN:  Are you having pain? No  PRECAUTIONS: Fall  RED FLAGS: None   WEIGHT BEARING RESTRICTIONS: No  FALLS:  Has patient fallen in last 6 months? No  LIVING ENVIRONMENT: Lives with: lives with their spouse Lives in: House/apartment Stairs: Yes: Internal: 17 steps; on right going up Has following equipment at home: Single point cane, Walker - 4 wheeled, and Wheelchair (manual)  OCCUPATION: retired  PLOF: Independent  PATIENT GOALS: get back in shape  NEXT MD VISIT: none scheduled  OBJECTIVE:  Note: Objective measures were completed at Evaluation unless otherwise noted.  DIAGNOSTIC FINDINGS:  BRAIN MRI IMPRESSION: 1. No specific or reversible explanation for memory loss. 2. 13 x 5 mm dural mass along the high left frontal  convexity favoring meningioma. Follow-up could confirm expected stability.  PATIENT SURVEYS:  ABC scale 1390 / 1600 = 86.9 %  COGNITION: Overall cognitive status: Within functional limits for tasks assessed     SENSATION: Not tested  MUSCLE LENGTH: Marked HS tightness to less than 45 deg, B piriformis tightness, marked heel cord tightness  POSTURE: rounded shoulders, forward head, increased thoracic kyphosis, and flexed trunk   PALPATION: unremarkable  LUMBAR ROM:   AROM eval  Flexion   Extension neutral  Right lateral flexion   Left lateral flexion   Right rotation WFL  Left rotation WFL   (Blank rows = not tested)  LOWER EXTREMITY ROM:   lacks full knee extension B and ankle DF     Right eval Left eval  Hip flexion    Hip extension    Hip abduction    Hip adduction    Hip internal rotation    Hip external rotation    Knee flexion    Knee extension    Ankle dorsiflexion    Ankle plantarflexion    Ankle inversion    Ankle eversion     (Blank rows = not tested)    FUNCTIONAL TESTS:  5 times sit to stand: 17.5 sec Timed up and go (TUG): 13.6 Berg Balance Scale: 52/56 03/25/23 6 MWT 710 ft DGI 24/24 11/13:  TUG  11.28  04/15/23   5XSTS 19.29 sec 04/18/23 16.57  GAIT: Distance walked: 20 feet Assistive device utilized: None Level of assistance: Modified independence Comments: flat footed, forward trunk lean, decreased trunk rotation, stride and step length and narrow BOS  TODAY'S TREATMENT:                                                                                                                              DATE:  04/18/23: Bike L2 x 5  Sit to stand 5 from chair Seated clam with yellow loop 2 x 10 STS with  yellow band x 10 to encourage gluteus med from chair + foam Side stepping : length of hallway with yellow loop thighs 4x 10 steps Leg press 45# seat 8 2x10 Hurdle walk: forward walk step to then step through 2 laps of each Hurdle  walk:  lateral stepping x 2 laps Grapevine foot behind x 4 laps  Grapevine foot in front x 4 laps Standing lat bar 35# 10x  At steps: left foot on 2nd step: overhead reach into lateral trunk flexion x 10 B Wall slides into flexion B shoulders palms facing  x 10 for ROM Standing with back to wall OH flexion toward wall for thoracic ext and shoulder ROM Back shoulders and feet to wall with left SB 5# KB in L hand x 10   04/17/23: Nustep L5 x 5  Wall slides into flexion B shoulders palms facing using foam roller in x 10 for ROM, Standing with back to wall OH flexion toward wall for thoracic ext and shoulder ROM Seated clam with yellow loop 2 x 10 STS with yellow band x 10 to encourage gluteus med from chair + foam Standing lat bar 30# 12x  At steps: left foot on 2nd step: overhead reach into left lateral trunk flexion Hurdle walk: forward walk step to then step through 2 laps of each Hurdle walk:  lateral stepping x 2 laps Walking with big full steps and having patient name countries  - did well Tandem walking length of hallway Side stepping : length of hallway  04/15/23: Bike L5 x 5  Wall slides into flexion B shoulders palms facing using foam roller in x 10 for ROM, Standing with back to wall OH flexion toward wall for thoracic ext and shoulder ROM Single arm wall slides flexion x 5 ea Standing lat bar 30# 12x  STS with yellow band x 10 to encourage gluteus med from chair + foam Seated clam with yellow loop 2 x 10 Standing hip ABD yellow loop 2 x 10 ea challenging Supine piriformis stretch 2x 30 sec B Ladder walk: forward walk (long steps) lateral stepping, in/out of each box, diagonal stepping  Walking with big full steps and having patient name countries  - did well Tandem walking length of hallway 4 corners CW/CCW, then with color call outs.  04/11/23: Nu-Step L5 x 6 while discussing session plan Wall slides into flexion B shoulders palms facing in x 10 for ROM Supine cane  OH flexion x 10 with 3 sec hold for ROM Standing lat bar 30# 12x  Ladder walk: forward walk (long steps) lateral stepping, in/out of each box, diagonal stepping  SL stance clock taps using dots x 5 B SL fwd taps to colors called out by PT done B STS x 5  then with yellow band x 5 to encourage gluteus med Seated clam with yellow loop x 10 Standing hip ABD yellow loop x 5 ea challenging   PATIENT EDUCATION:  Education details: PT eval findings, anticipated POC, initial HEP, and postural awareness  Person educated: Patient and Spouse Education method: Explanation, Demonstration, and Handouts Education comprehension: verbalized understanding and returned demonstration  HOME EXERCISE PROGRAM: Access Code: [email or web address] URL: [email or web address] Date: 04/11/2023 Prepared by: Raynelle Fanning  Exercises - Tandem Stance in Corner  - 1 x daily - 3-4 x weekly - 1 sets - 10 reps - Single Leg Stance  - 1 x daily - 3-4 x weekly - 1 sets - 10 reps - max hold - Gastroc Stretch on Wall  -  2 x daily - 7 x weekly - 1 sets - 2 reps - 30 hold - Seated Thoracic Extension Arms Overhead  - 1 x daily - 7 x weekly - 2 sets - 5 reps - 2-3 sec hold - Seated Trunk Rotation  - 1 x daily - 7 x weekly - 2 sets - 5 reps - 2-3 sec hold - Seated Diagonal Chops with Medicine Ball  - 1 x daily - 7 x weekly - 2 sets - 5 reps - 2-3 hold - Seated Long Arc Quad  - 2 x daily - 7 x weekly - 1-2 sets - 10 reps - Doorway Pec Stretch at 90 Degrees Abduction  - 1 x daily - 7 x weekly - 3 sets - 5 reps - Sit to Stand with Arms Crossed  - 1 x daily - 7 x weekly - 1 sets - 10 reps - Walking  - 1 x daily - 7 x weekly - 1 sets - 10 reps - Supine Shoulder Flexion Extension AAROM with Dowel  - 2 x daily - 7 x weekly - 2 sets - 10 reps - 3 sec hold - Standing shoulder flexion wall slides  - 2 x daily - 7 x weekly - 2 sets - 10 repst  ASSESSMENT:  CLINICAL IMPRESSION: Timouthy was challenged with grapevine activities today. He could  move to the right with no UE support, but to the left needed one UE support and had one LOB. We were able to progress lat pull and side stepping with resistance. His 5xSTS improved today, but he still fatigues fairly easily with these and requires foam pad when loop is added for resistance.   Eval: Patient is a 82 y.o. male who was seen today for physical therapy evaluation and treatment for gait and posture abnormalities secondary to myelopathy and lumbar spondylolisthesis. He denies pain and reports he is here because the doctor sent him. His wife reports that she is concerned with his gait. His gait pattern presents with little to no heel strike, forward trunk lean, decreased trunk rotation, stride and step length and narrow BOS. His BERG indicates he is at lower risk for falls (25%) and he is challenged mainly by higher level balance like tandem stance and SLS. His TUG and 5XSTS are outside the norms for his age and indicate he is at risk for falls. He will benefit from skilled PT to address these deficits.    OBJECTIVE IMPAIRMENTS: Abnormal gait, decreased balance, decreased ROM, decreased strength, impaired flexibility, and postural dysfunction.   ACTIVITY LIMITATIONS: squatting and locomotion level  PARTICIPATION LIMITATIONS: shopping  PERSONAL FACTORS: Age and 3+ comorbidities: Cervical myelopathy, B THA, brain meningioma  are also affecting patient's functional outcome.   REHAB POTENTIAL: Good  CLINICAL DECISION MAKING: Evolving/moderate complexity  EVALUATION COMPLEXITY: Moderate   GOALS: Goals reviewed with patient? Yes  SHORT TERM GOALS: Target date: 04/11/2023   Patient will be independent with initial HEP. Baseline:  Goal status: MET 04/03/23  2.  Patient will demonstrate decreased fall risk by scoring < 11 sec on TUG. Baseline: 13.6 sec Goal status: partially met 11/13 11.28  3.  Patient will be educated on strategies to decrease risk of falls.  Baseline:  Goal  status: MET 04/08/23   LONG TERM GOALS: Target date: 05/02/2023  Patient will be independent with advanced/ongoing HEP to improve outcomes and carryover.  Baseline:  Goal status: INITIAL  2.  Patient will be able to ambulate 600' with LRAD with  good safety to access community.  Baseline:  Goal status: MET  3.  Patient will demonstrate improved functional LE strength as demonstrated by improved 5XSTS to 14.8 sec. Baseline: 17.5 sec Goal status: INITIAL  4.  Patient will demonstrate at least 19/24 on DGI to improve gait stability and reduce risk for falls. Baseline:  Goal status: MET  5.  Patient will score 56/56 on Berg Balance test to demonstrate lower risk of falls.  Baseline: 52/56 Goal status: INITIAL  8.  Patient will demonstrate gait speed of >/= 1.8 ft/sec (0.55 m/s) to be a safe limited community ambulator with decreased risk for recurrent falls.  Baseline:  Goal status: MET 3.8ft/sec 04/08/23   PLAN:  PT FREQUENCY: 3x/week  PT DURATION: 6 weeks  PLANNED INTERVENTIONS: 97110-Therapeutic exercises, 97530- Therapeutic activity, 97112- Neuromuscular re-education, 97535- Self Care, [id number]- Manual therapy, [phone]- Gait training, [phone]- Aquatic Therapy, Patient/Family education, Balance training, Dry Needling, Joint mobilization, Spinal mobilization, Cryotherapy, and Moist heat.  PLAN FOR NEXT SESSION:  Continue SLS activities and targeted gluteus med strength, high level balance with cognitive challenges, postural strengthening,  posture with gait.  Solon Palm, PT  04/18/23 5:21 PM [facility] [address] [place], [place] [address] Phone # [phone] Fax [phone]

## 2023-04-30 DIAGNOSIS — H2513 Age-related nuclear cataract, bilateral: Secondary | ICD-10-CM | POA: Diagnosis not present

## 2023-04-30 DIAGNOSIS — D3132 Benign neoplasm of left choroid: Secondary | ICD-10-CM | POA: Diagnosis not present

## 2023-04-30 DIAGNOSIS — H43811 Vitreous degeneration, right eye: Secondary | ICD-10-CM | POA: Diagnosis not present

## 2023-04-30 DIAGNOSIS — H52223 Regular astigmatism, bilateral: Secondary | ICD-10-CM | POA: Diagnosis not present

## 2023-04-30 DIAGNOSIS — H524 Presbyopia: Secondary | ICD-10-CM | POA: Diagnosis not present

## 2023-04-30 DIAGNOSIS — H5203 Hypermetropia, bilateral: Secondary | ICD-10-CM | POA: Diagnosis not present

## 2023-05-07 ENCOUNTER — Encounter: Payer: Medicare Other | Admitting: Physical Therapy

## 2023-05-09 ENCOUNTER — Encounter: Payer: Self-pay | Admitting: Physical Therapy

## 2023-05-09 ENCOUNTER — Ambulatory Visit: Payer: Medicare Other | Attending: Specialist | Admitting: Physical Therapy

## 2023-05-09 ENCOUNTER — Encounter: Payer: Medicare Other | Admitting: Physical Therapy

## 2023-05-09 DIAGNOSIS — R2689 Other abnormalities of gait and mobility: Secondary | ICD-10-CM | POA: Insufficient documentation

## 2023-05-09 DIAGNOSIS — R293 Abnormal posture: Secondary | ICD-10-CM | POA: Diagnosis not present

## 2023-05-09 DIAGNOSIS — R252 Cramp and spasm: Secondary | ICD-10-CM | POA: Diagnosis not present

## 2023-05-09 DIAGNOSIS — M6281 Muscle weakness (generalized): Secondary | ICD-10-CM | POA: Diagnosis not present

## 2023-05-09 DIAGNOSIS — R2681 Unsteadiness on feet: Secondary | ICD-10-CM | POA: Insufficient documentation

## 2023-05-09 DIAGNOSIS — R262 Difficulty in walking, not elsewhere classified: Secondary | ICD-10-CM | POA: Diagnosis not present

## 2023-05-09 NOTE — Therapy (Signed)
OUTPATIENT PHYSICAL THERAPY THORACOLUMBAR TREATMENT /RE- CERTIFICATION    Patient Name: Jesse [name] MRN: [id number] DOB:Oct 18, 1940, 82 y.o., male Today's Date: 05/09/2023  END OF SESSION:  PT End of Session - 05/09/23 1711     Visit Number 14    Date for PT Re-Evaluation 06/06/23    Authorization Type MCR A/B    Progress Note Due on Visit 20    PT Start Time 1619    PT Stop Time 1658    PT Time Calculation (min) 39 min    Activity Tolerance Patient tolerated treatment well    Behavior During Therapy WFL for tasks assessed/performed                Past Medical History:  Diagnosis Date   Arthritis    Atrial fibrillation (HCC)    Complication of anesthesia    Atrial fibrillation during surgery    Dementia (HCC)    Depression    Hard of hearing    Hx of constipation    Osteoporosis    DEXA 11/09/10. Completed treatemtn with Fosamax.   Prostate cancer (HCC)    Restless legs    Thyroid disease    hypothyroidism   Vitiligo    Past Surgical History:  Procedure Laterality Date   BACK SURGERY     COLONOSCOPY     FLEXIBLE SIGMOIDOSCOPY N/A 09/27/2017   Procedure: FLEXIBLE SIGMOIDOSCOPY;  Surgeon: Iva Boop, MD;  Location: [facility] ENDOSCOPY;  Service: Endoscopy;  Laterality: N/A;   JOINT REPLACEMENT     LAMINECTOMY  10/2011   cervical and lumbar laminectomy   PROSTATE BIOPSY  07/10/2011   PROSTATE BIOPSY  09/22/15   TOTAL HIP ARTHROPLASTY Right 02/29/2012   TOTAL HIP ARTHROPLASTY Right 07/04/2016   Procedure: RIGHT TOTAL HIP ARTHROPLASTY ANTERIOR APPROACH;  Surgeon: Samson Frederic, MD;  Location: [facility] OR;  Service: Orthopedics;  Laterality: Right;   TRANSURETHRAL RESECTION OF PROSTATE     Patient Active Problem List   Diagnosis Date Noted   Wound drainage 09/10/2022   Vitamin B12 deficiency 08/26/2020   Restless legs 07/22/2019   History of skin cancer 01/12/2019   Erectile dysfunction 01/12/2019   Anxiety 01/12/2019   Volvulus of sigmoid colon s/p flex  sig/rectal tube 09/27/2017 09/27/2017   Mild dementia (HCC) 09/27/2017   Chronic constipation 09/27/2017   Osteoarthritis of right hip 06/05/2016   Prostate cancer (HCC) 12/07/2015   Hypothyroidism 12/07/2015    PCP: Ardith Dark, MD   REFERRING PROVIDER: Corky Sing, MD   REFERRING DIAG: [phone] (ICD-10-CM) - Other spondylosis with myelopathy, cervical region, lumbar stenosis with spondylolisthesis  Rationale for Evaluation and Treatment: Rehabilitation  THERAPY DIAG:  Muscle weakness (generalized)  Cramp and spasm  Abnormal posture  Difficulty in walking, not elsewhere classified  Unsteadiness on feet  Other abnormalities of gait and mobility  ONSET DATE: December 2023  SUBJECTIVE:  SUBJECTIVE STATEMENT: Patient reports his legs are feeling stiff today. He is not currently having any pain.  Goes by Jesse [name] Wife: [name]   PERTINENT HISTORY:  Cervical myelopathy, B THA,( Left anterior 2017/R posterior 2013) meningioma in brain (inoperable and growing)  PAIN:  Are you having pain? No  PRECAUTIONS: Fall  RED FLAGS: None   WEIGHT BEARING RESTRICTIONS: No  FALLS:  Has patient fallen in last 6 months? No  LIVING ENVIRONMENT: Lives with: lives with their spouse Lives in: House/apartment Stairs: Yes: Internal: 17 steps; on right going up Has following equipment at home: Single point cane, Walker - 4 wheeled, and Wheelchair (manual)  OCCUPATION: retired  PLOF: Independent  PATIENT GOALS: get back in shape  NEXT MD VISIT: none scheduled  OBJECTIVE:  Note: Objective measures were completed at Evaluation unless otherwise noted.  DIAGNOSTIC FINDINGS:  BRAIN MRI IMPRESSION: 1. No specific or reversible explanation for memory loss. 2. 13 x 5 mm dural mass along the high  left frontal convexity favoring meningioma. Follow-up could confirm expected stability.  PATIENT SURVEYS:  ABC scale 1390 / 1600 = 86.9 %  COGNITION: Overall cognitive status: Within functional limits for tasks assessed     SENSATION: Not tested  MUSCLE LENGTH: Marked HS tightness to less than 45 deg, B piriformis tightness, marked heel cord tightness  POSTURE: rounded shoulders, forward head, increased thoracic kyphosis, and flexed trunk   PALPATION: unremarkable  LUMBAR ROM:   AROM eval  Flexion   Extension neutral  Right lateral flexion   Left lateral flexion   Right rotation WFL  Left rotation WFL   (Blank rows = not tested)  LOWER EXTREMITY ROM:   lacks full knee extension B and ankle DF     Right eval Left eval  Hip flexion    Hip extension    Hip abduction    Hip adduction    Hip internal rotation    Hip external rotation    Knee flexion    Knee extension    Ankle dorsiflexion    Ankle plantarflexion    Ankle inversion    Ankle eversion     (Blank rows = not tested)    FUNCTIONAL TESTS:  5 times sit to stand: 17.5 sec Timed up and go (TUG): 13.6 Berg Balance Scale: 52/56 03/25/23 6 MWT 710 ft DGI 24/24 11/13:  TUG  11.28  04/15/23   5XSTS 19.29 sec 04/18/23 16.57  GAIT: Distance walked: 20 feet Assistive device utilized: None Level of assistance: Modified independence Comments: flat footed, forward trunk lean, decreased trunk rotation, stride and step length and narrow BOS  TODAY'S TREATMENT:                                                                                                                              DATE:  05/09/23: Bike L2 x 5 - PT present to discuss status Standing hamstring stretch at 2nd stair 2 x 30 sec bilateral  Standing knee flexion stretch at 2nd stair 2 x 30  sec bilateral STS with yellow band x 10 to encourage gluteus med from chair + foam Seated clam with yellow loop 2 x 10 Standing shoulder rows with  red TB 2 x 10 6inch step taps x 10 bilateral unilateral UE support Standing lat bar 35# 10x  Leg press 45# seat 8 2x10 Seated D2 flexion/ extension with yellow ball x 8 each direction Hurdle walk: forward walk  step through 2 laps of each no- min UE support Hurdle walk:  lateral stepping x 2 laps no- min UE support   04/18/23: Bike L2 x 5  Sit to stand 5 from chair Seated clam with yellow loop 2 x 10 STS with yellow band x 10 to encourage gluteus med from chair + foam Side stepping : length of hallway with yellow loop thighs 4x 10 steps Leg press 45# seat 8 2x10 Hurdle walk: forward walk step to then step through 2 laps of each Hurdle walk:  lateral stepping x 2 laps [place]vine foot behind x 4 laps  Grapevine foot in front x 4 laps Standing lat bar 35# 10x  At steps: left foot on 2nd step: overhead reach into lateral trunk flexion x 10 B Wall slides into flexion B shoulders palms facing  x 10 for ROM Standing with back to wall OH flexion toward wall for thoracic ext and shoulder ROM Back shoulders and feet to wall with left SB 5# KB in L hand x 10   04/17/23: Nustep L5 x 5  Wall slides into flexion B shoulders palms facing using foam roller in x 10 for ROM, Standing with back to wall OH flexion toward wall for thoracic ext and shoulder ROM Seated clam with yellow loop 2 x 10 STS with yellow band x 10 to encourage gluteus med from chair + foam Standing lat bar 30# 12x  At steps: left foot on 2nd step: overhead reach into left lateral trunk flexion Hurdle walk: forward walk step to then step through 2 laps of each Hurdle walk:  lateral stepping x 2 laps Walking with big full steps and having patient name countries  - did well Tandem walking length of hallway Side stepping : length of hallway     PATIENT EDUCATION:  Education details: PT eval findings, anticipated POC, initial HEP, and postural awareness  Person educated: Patient and Spouse Education method: Explanation,  Demonstration, and Handouts Education comprehension: verbalized understanding and returned demonstration  HOME EXERCISE PROGRAM: Access Code: [email or web address] URL: [email or web address] Date: 04/11/2023 Prepared by: Raynelle Fanning  Exercises - Tandem Stance in Corner  - 1 x daily - 3-4 x weekly - 1 sets - 10 reps - Single Leg Stance  - 1 x daily - 3-4 x weekly - 1 sets - 10 reps - max hold - Gastroc Stretch on Wall  - 2 x daily - 7 x weekly - 1 sets - 2 reps - 30 hold - Seated Thoracic Extension Arms Overhead  - 1 x daily - 7 x weekly - 2 sets - 5 reps - 2-3 sec hold - Seated Trunk Rotation  - 1 x daily - 7 x weekly - 2 sets - 5 reps - 2-3 sec hold - Seated Diagonal Chops with Medicine Ball  - 1 x daily - 7 x weekly - 2 sets - 5 reps - 2-3 hold - Seated Long Arc Quad  - 2 x daily - 7 x weekly - 1-2 sets - 10 reps -  Doorway Pec Stretch at 90 Degrees Abduction  - 1 x daily - 7 x weekly - 3 sets - 5 reps - Sit to Stand with Arms Crossed  - 1 x daily - 7 x weekly - 1 sets - 10 reps - Walking  - 1 x daily - 7 x weekly - 1 sets - 10 reps - Supine Shoulder Flexion Extension AAROM with Dowel  - 2 x daily - 7 x weekly - 2 sets - 10 reps - 3 sec hold - Standing shoulder flexion wall slides  - 2 x daily - 7 x weekly - 2 sets - 10 repst  ASSESSMENT:  CLINICAL IMPRESSION: Today's treatment session focused on general strengthening and hip mobility.  Cailen presents after a two week break from therapy. Since starting therapy he has made great improvements, and he is on track to meet all of his goals.. Therapy has focused on balance, postural strengthening, and hip abductor strengthening. Chimaobi tolerated treatment session well and noted mild shortness of breath. With sit to stand patient required verbal cues to press against band to engage hip abductors. Patient will continue to benefit from continued skilled PT to address the below impairments and meet remaining goals.    Eval: Patient is a 82 y.o. male who  was seen today for physical therapy evaluation and treatment for gait and posture abnormalities secondary to myelopathy and lumbar spondylolisthesis. He denies pain and reports he is here because the doctor sent him. His wife reports that she is concerned with his gait. His gait pattern presents with little to no heel strike, forward trunk lean, decreased trunk rotation, stride and step length and narrow BOS. His BERG indicates he is at lower risk for falls (25%) and he is challenged mainly by higher level balance like tandem stance and SLS. His TUG and 5XSTS are outside the norms for his age and indicate he is at risk for falls. He will benefit from skilled PT to address these deficits.    OBJECTIVE IMPAIRMENTS: Abnormal gait, decreased balance, decreased ROM, decreased strength, impaired flexibility, and postural dysfunction.   ACTIVITY LIMITATIONS: squatting and locomotion level  PARTICIPATION LIMITATIONS: shopping  PERSONAL FACTORS: Age and 3+ comorbidities: Cervical myelopathy, B THA, brain meningioma  are also affecting patient's functional outcome.   REHAB POTENTIAL: Good  CLINICAL DECISION MAKING: Evolving/moderate complexity  EVALUATION COMPLEXITY: Moderate   GOALS: Goals reviewed with patient? Yes  SHORT TERM GOALS: Target date: 04/11/2023   Patient will be independent with initial HEP. Baseline:  Goal status: MET 04/03/23  2.  Patient will demonstrate decreased fall risk by scoring < 11 sec on TUG. Baseline: 13.6 sec Goal status: partially met 11/13 11.28  3.  Patient will be educated on strategies to decrease risk of falls.  Baseline:  Goal status: MET 04/08/23   LONG TERM GOALS: Target date: 06/06/2023   Patient will be independent with advanced/ongoing HEP to improve outcomes and carryover.  Baseline:  Goal status: IN PROGRESS12/04/2023  2.  Patient will be able to ambulate 600' with LRAD with good safety to access community.  Baseline:  Goal status: MET  3.   Patient will demonstrate improved functional LE strength as demonstrated by improved 5XSTS to 14.8 sec. Baseline: 17.5 sec Goal status: IN PROGRESS12/04/2023  4.  Patient will demonstrate at least 19/24 on DGI to improve gait stability and reduce risk for falls. Baseline:  Goal status: MET  5.  Patient will score 56/56 on Berg Balance test to demonstrate lower  risk of falls.  Baseline: 52/56 Goal status: IN PROGRESS 05/09/2023  8.  Patient will demonstrate gait speed of >/= 1.8 ft/sec (0.55 m/s) to be a safe limited community ambulator with decreased risk for recurrent falls.  Baseline:  Goal status: MET 3.27ft/sec 04/08/23   PLAN:  PT FREQUENCY: 1-2x/week  PT DURATION: 4 weeks  PLANNED INTERVENTIONS: 97110-Therapeutic exercises, 97530- Therapeutic activity, 97112- Neuromuscular re-education, 97535- Self Care, 16109- Manual therapy, [phone]- Gait training, [phone]- Aquatic Therapy, Patient/Family education, Balance training, Dry Needling, Joint mobilization, Spinal mobilization, Cryotherapy, and Moist heat.  PLAN FOR NEXT SESSION:  assess response to treatment session; Continue SLS activities and targeted gluteus med strength, high level balance with cognitive challenges, postural strengthening,  posture with gait.  Claude Manges, PT 05/09/23 5:19 PM [facility] [address] [place], [place] [address] Phone # [phone] Fax [phone]

## 2023-05-10 ENCOUNTER — Encounter: Payer: Self-pay | Admitting: Physical Therapy

## 2023-05-10 ENCOUNTER — Ambulatory Visit: Payer: Medicare Other | Admitting: Physical Therapy

## 2023-05-10 DIAGNOSIS — R2681 Unsteadiness on feet: Secondary | ICD-10-CM

## 2023-05-10 DIAGNOSIS — R262 Difficulty in walking, not elsewhere classified: Secondary | ICD-10-CM

## 2023-05-10 DIAGNOSIS — R293 Abnormal posture: Secondary | ICD-10-CM | POA: Diagnosis not present

## 2023-05-10 DIAGNOSIS — M6281 Muscle weakness (generalized): Secondary | ICD-10-CM

## 2023-05-10 DIAGNOSIS — R2689 Other abnormalities of gait and mobility: Secondary | ICD-10-CM

## 2023-05-10 DIAGNOSIS — R252 Cramp and spasm: Secondary | ICD-10-CM | POA: Diagnosis not present

## 2023-05-10 NOTE — Therapy (Signed)
OUTPATIENT PHYSICAL THERAPY THORACOLUMBAR TREATMENT AND DISCHARGE SUMMARY   Patient Name: Jesse [name] MRN: [id number] DOB:[birth year], 82 y.o., male Today's Date: 05/10/2023  END OF SESSION:  PT End of Session - 05/10/23 0846     Visit Number 15    Date for PT Re-Evaluation 06/06/23    Authorization Type MCR A/B    Progress Note Due on Visit 20    PT Start Time 0846    PT Stop Time 0928    PT Time Calculation (min) 42 min    Activity Tolerance Patient tolerated treatment well    Behavior During Therapy [facility] for tasks assessed/performed                 Past Medical History:  Diagnosis Date   Arthritis    Atrial fibrillation (HCC)    Complication of anesthesia    Atrial fibrillation during surgery    Dementia (HCC)    Depression    Hard of hearing    Hx of constipation    Osteoporosis    DEXA 11/09/10. Completed treatemtn with Fosamax.   Prostate cancer (HCC)    Restless legs    Thyroid disease    hypothyroidism   Vitiligo    Past Surgical History:  Procedure Laterality Date   BACK SURGERY     COLONOSCOPY     FLEXIBLE SIGMOIDOSCOPY N/A 09/27/2017   Procedure: FLEXIBLE SIGMOIDOSCOPY;  Surgeon: Iva Boop, MD;  Location: [facility] ENDOSCOPY;  Service: Endoscopy;  Laterality: N/A;   JOINT REPLACEMENT     LAMINECTOMY  10/2011   cervical and lumbar laminectomy   PROSTATE BIOPSY  07/10/2011   PROSTATE BIOPSY  09/22/15   TOTAL HIP ARTHROPLASTY Right 02/29/2012   TOTAL HIP ARTHROPLASTY Right 07/04/2016   Procedure: RIGHT TOTAL HIP ARTHROPLASTY ANTERIOR APPROACH;  Surgeon: Samson Frederic, MD;  Location: [facility] OR;  Service: Orthopedics;  Laterality: Right;   TRANSURETHRAL RESECTION OF PROSTATE     Patient Active Problem List   Diagnosis Date Noted   Wound drainage 09/10/2022   Vitamin B12 deficiency 08/26/2020   Restless legs 07/22/2019   History of skin cancer 01/12/2019   Erectile dysfunction 01/12/2019   Anxiety 01/12/2019   Volvulus of sigmoid colon s/p flex  sig/rectal tube 09/27/2017 09/27/2017   Mild dementia (HCC) 09/27/2017   Chronic constipation 09/27/2017   Osteoarthritis of right hip 06/05/2016   Prostate cancer (HCC) 12/07/2015   Hypothyroidism 12/07/2015    PCP: Ardith Dark, MD   REFERRING PROVIDER: Corky Sing, MD   REFERRING DIAG: [phone] (ICD-10-CM) - Other spondylosis with myelopathy, cervical region, lumbar stenosis with spondylolisthesis  Rationale for Evaluation and Treatment: Rehabilitation  THERAPY DIAG:  Muscle weakness (generalized)  Cramp and spasm  Abnormal posture  Difficulty in walking, not elsewhere classified  Unsteadiness on feet  Other abnormalities of gait and mobility  ONSET DATE: December 2023  SUBJECTIVE:  SUBJECTIVE STATEMENT: I'm sore today in the legs and they are tight.  Goes by Jesse Ruiz Wife: [name]   PERTINENT HISTORY:  Cervical myelopathy, B THA,( Left anterior 2017/R posterior 2013) meningioma in brain (inoperable and growing)  PAIN:  Are you having pain? No  PRECAUTIONS: Fall  RED FLAGS: None   WEIGHT BEARING RESTRICTIONS: No  FALLS:  Has patient fallen in last 6 months? No  LIVING ENVIRONMENT: Lives with: lives with their spouse Lives in: House/apartment Stairs: Yes: Internal: 17 steps; on right going up Has following equipment at home: Single point cane, Walker - 4 wheeled, and Wheelchair (manual)  OCCUPATION: retired  PLOF: Independent  PATIENT GOALS: get back in shape  NEXT MD VISIT: none scheduled  OBJECTIVE:  Note: Objective measures were completed at Evaluation unless otherwise noted.  DIAGNOSTIC FINDINGS:  BRAIN MRI IMPRESSION: 1. No specific or reversible explanation for memory loss. 2. 13 x 5 mm dural mass along the high left frontal convexity favoring  meningioma. Follow-up could confirm expected stability.  PATIENT SURVEYS:  ABC scale 1390 / 1600 = 86.9 %  COGNITION: Overall cognitive status: Within functional limits for tasks assessed     SENSATION: Not tested  MUSCLE LENGTH: Marked HS tightness to less than 45 deg, B piriformis tightness, marked heel cord tightness  POSTURE: rounded shoulders, forward head, increased thoracic kyphosis, and flexed trunk   PALPATION: unremarkable  LUMBAR ROM:   AROM eval  Flexion   Extension neutral  Right lateral flexion   Left lateral flexion   Right rotation WFL  Left rotation WFL   (Blank rows = not tested)  LOWER EXTREMITY ROM:   lacks full knee extension B and ankle DF     Right eval Left eval  Hip flexion    Hip extension    Hip abduction    Hip adduction    Hip internal rotation    Hip external rotation    Knee flexion    Knee extension    Ankle dorsiflexion    Ankle plantarflexion    Ankle inversion    Ankle eversion     (Blank rows = not tested)    FUNCTIONAL TESTS:  5 times sit to stand: 17.5 sec Timed up and go (TUG): 13.6 Berg Balance Scale: 52/56 03/25/23 6 MWT 710 ft DGI 24/24 11/13:  TUG  11.28  04/15/23   5XSTS 19.29 sec 04/18/23 16.57  12/13 BERG 54/56 5XSTS 16.42 sec  GAIT: Distance walked: 20 feet Assistive device utilized: None Level of assistance: Modified independence Comments: flat footed, forward trunk lean, decreased trunk rotation, stride and step length and narrow BOS  TODAY'S TREATMENT:                                                                                                                              DATE:  05/10/23: Bike L2 x 5 - PT present to discuss status 5XSTS and BERG assessed Standing hamstring stretch at 2nd  stair 2 x 30 sec bilateral  Standing knee flexion stretch at 2nd stair 2 x 30  sec bilateral STS with yellow band x 10 to encourage gluteus med from chair Seated clam with yellow loop 2 x  10 Standing shoulder rows with green TB 2 x 10 Standing lat bar 35# 10x  Leg press 45# seat 8 2x10 Seated OH flexion x 10 and D2 flexion/ extension with yellow ball x 10 each direction Hurdle walk: forward walk  step through 2 laps of each no- min UE support Hurdle walk:  lateral stepping x 2 laps no- min UE support  05/09/23: Bike L2 x 5 - PT present to discuss status Standing hamstring stretch at 2nd stair 2 x 30 sec bilateral  Standing knee flexion stretch at 2nd stair 2 x 30  sec bilateral STS with yellow band x 10 to encourage gluteus med from chair + foam Seated clam with yellow loop 2 x 10 Standing shoulder rows with red TB 2 x 10 6inch step taps x 10 bilateral unilateral UE support Standing lat bar 35# 10x  Leg press 45# seat 8 2x10 Seated D2 flexion/ extension with yellow ball x 8 each direction Hurdle walk: forward walk  step through 2 laps of each no- min UE support Hurdle walk:  lateral stepping x 2 laps no- min UE support   04/18/23: Bike L2 x 5  Sit to stand 5 from chair Seated clam with yellow loop 2 x 10 STS with yellow band x 10 to encourage gluteus med from chair + foam Side stepping : length of hallway with yellow loop thighs 4x 10 steps Leg press 45# seat 8 2x10 Hurdle walk: forward walk step to then step through 2 laps of each Hurdle walk:  lateral stepping x 2 laps G[place]vine foot behind x 4 laps  Grapevine foot in front x 4 laps Standing lat bar 35# 10x  At steps: left foot on 2nd step: overhead reach into lateral trunk flexion x 10 B Wall slides into flexion B shoulders palms facing  x 10 for ROM Standing with back to wall OH flexion toward wall for thoracic ext and shoulder ROM Back shoulders and feet to wall with left SB 5# KB in L hand x 10   04/17/23: Nustep L5 x 5  Wall slides into flexion B shoulders palms facing using foam roller in x 10 for ROM, Standing with back to wall OH flexion toward wall for thoracic ext and shoulder ROM Seated clam  with yellow loop 2 x 10 STS with yellow band x 10 to encourage gluteus med from chair + foam Standing lat bar 30# 12x  At steps: left foot on 2nd step: overhead reach into left lateral trunk flexion Hurdle walk: forward walk step to then step through 2 laps of each Hurdle walk:  lateral stepping x 2 laps Walking with big full steps and having patient name countries  - did well Tandem walking length of hallway Side stepping : length of hallway     PATIENT EDUCATION:  Education details: PT eval findings, anticipated POC, initial HEP, and postural awareness  Person educated: Patient and Spouse Education method: Explanation, Demonstration, and Handouts Education comprehension: verbalized understanding and returned demonstration  HOME EXERCISE PROGRAM: Access Code: [email or web address] URL: [email or web address] Date: 04/11/2023 Prepared by: Raynelle Fanning  Exercises - Tandem Stance in Corner  - 1 x daily - 3-4 x weekly - 1 sets - 10 reps - Single Leg Stance  - 1 x daily -  3-4 x weekly - 1 sets - 10 reps - max hold - Gastroc Stretch on Wall  - 2 x daily - 7 x weekly - 1 sets - 2 reps - 30 hold - Seated Thoracic Extension Arms Overhead  - 1 x daily - 7 x weekly - 2 sets - 5 reps - 2-3 sec hold - Seated Trunk Rotation  - 1 x daily - 7 x weekly - 2 sets - 5 reps - 2-3 sec hold - Seated Diagonal Chops with Medicine Ball  - 1 x daily - 7 x weekly - 2 sets - 5 reps - 2-3 hold - Seated Long Arc Quad  - 2 x daily - 7 x weekly - 1-2 sets - 10 reps - Doorway Pec Stretch at 90 Degrees Abduction  - 1 x daily - 7 x weekly - 3 sets - 5 reps - Sit to Stand with Arms Crossed  - 1 x daily - 7 x weekly - 1 sets - 10 reps - Walking  - 1 x daily - 7 x weekly - 1 sets - 10 reps - Supine Shoulder Flexion Extension AAROM with Dowel  - 2 x daily - 7 x weekly - 2 sets - 10 reps - 3 sec hold - Standing shoulder flexion wall slides  - 2 x daily - 7 x weekly - 2 sets - 10 repst  ASSESSMENT:  CLINICAL  IMPRESSION: Jesse [name] has met all of his LTG's except his 5XSTS goal. He has improved in strength to be able to perform repeated sit to stands from the chair without foam pad. He still demonstrates some glute med weakness, but is independent with his HEP. He is pleased with his current functional level and agrees to discharge today.    OBJECTIVE IMPAIRMENTS: Abnormal gait, decreased balance, decreased ROM, decreased strength, impaired flexibility, and postural dysfunction.   ACTIVITY LIMITATIONS: squatting and locomotion level  PARTICIPATION LIMITATIONS: shopping  PERSONAL FACTORS: Age and 3+ comorbidities: Cervical myelopathy, B THA, brain meningioma  are also affecting patient's functional outcome.   REHAB POTENTIAL: Good  CLINICAL DECISION MAKING: Evolving/moderate complexity  EVALUATION COMPLEXITY: Moderate   GOALS: Goals reviewed with patient? Yes  SHORT TERM GOALS: Target date: 04/11/2023   Patient will be independent with initial HEP. Baseline:  Goal status: MET 04/03/23  2.  Patient will demonstrate decreased fall risk by scoring < 11 sec on TUG. Baseline: 13.6 sec Goal status: partially met 11/13 11.28  3.  Patient will be educated on strategies to decrease risk of falls.  Baseline:  Goal status: MET 04/08/23   LONG TERM GOALS: Target date: 06/06/2023   Patient will be independent with advanced/ongoing HEP to improve outcomes and carryover.  Baseline:  Goal status: MET  2.  Patient will be able to ambulate 600' with LRAD with good safety to access community.  Baseline:  Goal status: MET  3.  Patient will demonstrate improved functional LE strength as demonstrated by improved 5XSTS to 14.8 sec. Baseline: 17.5 sec Goal status: IN PROGRESS12/13 16.42 sec  4.  Patient will demonstrate at least 19/24 on DGI to improve gait stability and reduce risk for falls. Baseline:  Goal status: MET  5.  Patient will score 56/56 on Berg Balance test to demonstrate lower risk of  falls.  Baseline: 52/56 Goal status:  MET 12/13 54/56  8.  Patient will demonstrate gait speed of >/= 1.8 ft/sec (0.55 m/s) to be a safe limited community ambulator with decreased risk for recurrent falls.  Baseline:  Goal status: MET 3.34ft/sec 04/08/23   PLAN:  PT FREQUENCY: 1-2x/week  PT DURATION: 4 weeks  PLANNED INTERVENTIONS: 97110-Therapeutic exercises, 97530- Therapeutic activity, 97112- Neuromuscular re-education, 97535- Self Care, 82956- Manual therapy, [phone]- Gait training, [phone]- Aquatic Therapy, Patient/Family education, Balance training, Dry Needling, Joint mobilization, Spinal mobilization, Cryotherapy, and Moist heat.  PLAN FOR NEXT SESSION:  see d/c summary  Solon Palm, PT  05/10/23 11:18 AM [facility] [address], [address] [place], [place] [address] Phone # [phone] Fax [phone]  PHYSICAL THERAPY DISCHARGE SUMMARY  Visits from Start of Care: 15  Current functional level related to goals / functional outcomes: See above   Remaining deficits: See above   Education / Equipment: HEP and theraband   Patient agrees to discharge. Patient goals were partially met. Patient is being discharged due to being pleased with the current functional level.   Solon Palm, PT 05/10/23 11:18 AM [facility] [address], [address] [place], [place] [address] Phone # [phone] Fax [phone]

## 2023-09-30 ENCOUNTER — Inpatient Hospital Stay (HOSPITAL_COMMUNITY)

## 2023-09-30 ENCOUNTER — Emergency Department (HOSPITAL_COMMUNITY)

## 2023-09-30 ENCOUNTER — Other Ambulatory Visit: Payer: Self-pay

## 2023-09-30 ENCOUNTER — Inpatient Hospital Stay (HOSPITAL_COMMUNITY)
Admission: EM | Admit: 2023-09-30 | Discharge: 2023-10-10 | DRG: 025 | Disposition: A | Discharge status: Rehabilitation Facility | Attending: Internal Medicine | Admitting: Internal Medicine

## 2023-09-30 DIAGNOSIS — Z86018 Personal history of other benign neoplasm: Secondary | ICD-10-CM | POA: Diagnosis not present

## 2023-09-30 DIAGNOSIS — S065X0A Traumatic subdural hemorrhage without loss of consciousness, initial encounter: Principal | ICD-10-CM | POA: Diagnosis present

## 2023-09-30 DIAGNOSIS — R29810 Facial weakness: Secondary | ICD-10-CM | POA: Diagnosis not present

## 2023-09-30 DIAGNOSIS — Z87891 Personal history of nicotine dependence: Secondary | ICD-10-CM

## 2023-09-30 DIAGNOSIS — R339 Retention of urine, unspecified: Secondary | ICD-10-CM | POA: Diagnosis not present

## 2023-09-30 DIAGNOSIS — D329 Benign neoplasm of meninges, unspecified: Secondary | ICD-10-CM | POA: Diagnosis not present

## 2023-09-30 DIAGNOSIS — S065XAA Traumatic subdural hemorrhage with loss of consciousness status unknown, initial encounter: Secondary | ICD-10-CM | POA: Diagnosis not present

## 2023-09-30 DIAGNOSIS — F03A3 Unspecified dementia, mild, with mood disturbance: Secondary | ICD-10-CM | POA: Diagnosis present

## 2023-09-30 DIAGNOSIS — F418 Other specified anxiety disorders: Secondary | ICD-10-CM | POA: Diagnosis not present

## 2023-09-30 DIAGNOSIS — R338 Other retention of urine: Secondary | ICD-10-CM | POA: Diagnosis not present

## 2023-09-30 DIAGNOSIS — S061X0A Traumatic cerebral edema without loss of consciousness, initial encounter: Secondary | ICD-10-CM | POA: Diagnosis present

## 2023-09-30 DIAGNOSIS — E871 Hypo-osmolality and hyponatremia: Secondary | ICD-10-CM | POA: Diagnosis present

## 2023-09-30 DIAGNOSIS — W010XXA Fall on same level from slipping, tripping and stumbling without subsequent striking against object, initial encounter: Secondary | ICD-10-CM | POA: Diagnosis present

## 2023-09-30 DIAGNOSIS — G935 Compression of brain: Secondary | ICD-10-CM | POA: Diagnosis present

## 2023-09-30 DIAGNOSIS — S062XAA Diffuse traumatic brain injury with loss of consciousness status unknown, initial encounter: Secondary | ICD-10-CM | POA: Diagnosis not present

## 2023-09-30 DIAGNOSIS — R531 Weakness: Secondary | ICD-10-CM | POA: Diagnosis not present

## 2023-09-30 DIAGNOSIS — R471 Dysarthria and anarthria: Secondary | ICD-10-CM | POA: Diagnosis present

## 2023-09-30 DIAGNOSIS — G9340 Encephalopathy, unspecified: Secondary | ICD-10-CM | POA: Diagnosis not present

## 2023-09-30 DIAGNOSIS — N390 Urinary tract infection, site not specified: Secondary | ICD-10-CM | POA: Diagnosis not present

## 2023-09-30 DIAGNOSIS — K567 Ileus, unspecified: Secondary | ICD-10-CM | POA: Diagnosis not present

## 2023-09-30 DIAGNOSIS — E222 Syndrome of inappropriate secretion of antidiuretic hormone: Secondary | ICD-10-CM | POA: Diagnosis not present

## 2023-09-30 DIAGNOSIS — G2581 Restless legs syndrome: Secondary | ICD-10-CM | POA: Diagnosis present

## 2023-09-30 DIAGNOSIS — K5909 Other constipation: Secondary | ICD-10-CM | POA: Diagnosis not present

## 2023-09-30 DIAGNOSIS — S51012A Laceration without foreign body of left elbow, initial encounter: Secondary | ICD-10-CM | POA: Diagnosis present

## 2023-09-30 DIAGNOSIS — E039 Hypothyroidism, unspecified: Secondary | ICD-10-CM | POA: Diagnosis present

## 2023-09-30 DIAGNOSIS — F32A Depression, unspecified: Secondary | ICD-10-CM | POA: Diagnosis present

## 2023-09-30 DIAGNOSIS — S0083XA Contusion of other part of head, initial encounter: Secondary | ICD-10-CM | POA: Diagnosis present

## 2023-09-30 DIAGNOSIS — F03A0 Unspecified dementia, mild, without behavioral disturbance, psychotic disturbance, mood disturbance, and anxiety: Secondary | ICD-10-CM | POA: Diagnosis present

## 2023-09-30 DIAGNOSIS — R55 Syncope and collapse: Secondary | ICD-10-CM | POA: Diagnosis not present

## 2023-09-30 DIAGNOSIS — Z79890 Hormone replacement therapy (postmenopausal): Secondary | ICD-10-CM

## 2023-09-30 DIAGNOSIS — Z515 Encounter for palliative care: Secondary | ICD-10-CM | POA: Diagnosis not present

## 2023-09-30 DIAGNOSIS — Z96641 Presence of right artificial hip joint: Secondary | ICD-10-CM | POA: Diagnosis present

## 2023-09-30 DIAGNOSIS — C61 Malignant neoplasm of prostate: Secondary | ICD-10-CM | POA: Diagnosis not present

## 2023-09-30 DIAGNOSIS — E876 Hypokalemia: Secondary | ICD-10-CM | POA: Diagnosis present

## 2023-09-30 DIAGNOSIS — M810 Age-related osteoporosis without current pathological fracture: Secondary | ICD-10-CM | POA: Diagnosis present

## 2023-09-30 DIAGNOSIS — D62 Acute posthemorrhagic anemia: Secondary | ICD-10-CM | POA: Diagnosis not present

## 2023-09-30 DIAGNOSIS — D320 Benign neoplasm of cerebral meninges: Secondary | ICD-10-CM | POA: Diagnosis present

## 2023-09-30 DIAGNOSIS — Y846 Urinary catheterization as the cause of abnormal reaction of the patient, or of later complication, without mention of misadventure at the time of the procedure: Secondary | ICD-10-CM | POA: Diagnosis present

## 2023-09-30 DIAGNOSIS — Z9889 Other specified postprocedural states: Secondary | ICD-10-CM | POA: Diagnosis not present

## 2023-09-30 DIAGNOSIS — Z9079 Acquired absence of other genital organ(s): Secondary | ICD-10-CM

## 2023-09-30 DIAGNOSIS — Z79899 Other long term (current) drug therapy: Secondary | ICD-10-CM

## 2023-09-30 DIAGNOSIS — R569 Unspecified convulsions: Secondary | ICD-10-CM | POA: Diagnosis not present

## 2023-09-30 DIAGNOSIS — Z923 Personal history of irradiation: Secondary | ICD-10-CM

## 2023-09-30 DIAGNOSIS — I4891 Unspecified atrial fibrillation: Secondary | ICD-10-CM | POA: Diagnosis not present

## 2023-09-30 DIAGNOSIS — G9349 Other encephalopathy: Secondary | ICD-10-CM | POA: Diagnosis present

## 2023-09-30 DIAGNOSIS — I480 Paroxysmal atrial fibrillation: Secondary | ICD-10-CM | POA: Diagnosis present

## 2023-09-30 DIAGNOSIS — Z8546 Personal history of malignant neoplasm of prostate: Secondary | ICD-10-CM | POA: Diagnosis not present

## 2023-09-30 DIAGNOSIS — R296 Repeated falls: Secondary | ICD-10-CM | POA: Diagnosis present

## 2023-09-30 DIAGNOSIS — K0889 Other specified disorders of teeth and supporting structures: Secondary | ICD-10-CM | POA: Diagnosis present

## 2023-09-30 DIAGNOSIS — R350 Frequency of micturition: Secondary | ICD-10-CM | POA: Diagnosis present

## 2023-09-30 DIAGNOSIS — F0390 Unspecified dementia without behavioral disturbance: Secondary | ICD-10-CM | POA: Diagnosis not present

## 2023-09-30 DIAGNOSIS — G9389 Other specified disorders of brain: Secondary | ICD-10-CM | POA: Diagnosis not present

## 2023-09-30 DIAGNOSIS — K560 Paralytic ileus: Secondary | ICD-10-CM | POA: Diagnosis not present

## 2023-09-30 DIAGNOSIS — R5381 Other malaise: Secondary | ICD-10-CM | POA: Diagnosis not present

## 2023-09-30 DIAGNOSIS — I959 Hypotension, unspecified: Secondary | ICD-10-CM | POA: Diagnosis not present

## 2023-09-30 DIAGNOSIS — S065XAD Traumatic subdural hemorrhage with loss of consciousness status unknown, subsequent encounter: Secondary | ICD-10-CM | POA: Diagnosis not present

## 2023-09-30 DIAGNOSIS — R4182 Altered mental status, unspecified: Secondary | ICD-10-CM | POA: Diagnosis not present

## 2023-09-30 DIAGNOSIS — Z8349 Family history of other endocrine, nutritional and metabolic diseases: Secondary | ICD-10-CM | POA: Diagnosis not present

## 2023-09-30 DIAGNOSIS — I629 Nontraumatic intracranial hemorrhage, unspecified: Secondary | ICD-10-CM

## 2023-09-30 DIAGNOSIS — R4701 Aphasia: Secondary | ICD-10-CM | POA: Diagnosis not present

## 2023-09-30 DIAGNOSIS — I6200 Nontraumatic subdural hemorrhage, unspecified: Secondary | ICD-10-CM | POA: Diagnosis not present

## 2023-09-30 DIAGNOSIS — T83518A Infection and inflammatory reaction due to other urinary catheter, initial encounter: Secondary | ICD-10-CM | POA: Diagnosis not present

## 2023-09-30 DIAGNOSIS — I951 Orthostatic hypotension: Secondary | ICD-10-CM | POA: Diagnosis present

## 2023-09-30 HISTORY — DX: Encephalopathy, unspecified: G93.40

## 2023-09-30 LAB — BASIC METABOLIC PANEL WITH GFR
Anion gap: 8 (ref 5–15)
BUN: 8 mg/dL (ref 8–23)
CO2: 19 mmol/L — ABNORMAL LOW (ref 22–32)
Calcium: 6.9 mg/dL — ABNORMAL LOW (ref 8.9–10.3)
Chloride: 108 mmol/L (ref 98–111)
Creatinine, Ser: 0.63 mg/dL (ref 0.61–1.24)
GFR, Estimated: >60 mL/min (ref >60)
Glucose, Bld: 93 mg/dL (ref 70–99)
Potassium: 3.3 mmol/L — ABNORMAL LOW (ref 3.5–5.1)
Sodium: 135 mmol/L (ref 135–145)

## 2023-09-30 LAB — COMPREHENSIVE METABOLIC PANEL WITH GFR
ALT: 18 U/L (ref 0–44)
AST: 32 U/L (ref 15–41)
Albumin: 4 g/dL (ref 3.5–5.0)
Alkaline Phosphatase: 67 U/L (ref 38–126)
Anion gap: 12 (ref 5–15)
BUN: 17 mg/dL (ref 8–23)
CO2: 21 mmol/L — ABNORMAL LOW (ref 22–32)
Calcium: 9 mg/dL (ref 8.9–10.3)
Chloride: 95 mmol/L — ABNORMAL LOW (ref 98–111)
Creatinine, Ser: 0.93 mg/dL (ref 0.61–1.24)
GFR, Estimated: >60 mL/min (ref >60)
Glucose, Bld: 103 mg/dL — ABNORMAL HIGH (ref 70–99)
Potassium: 4.1 mmol/L (ref 3.5–5.1)
Sodium: 128 mmol/L — ABNORMAL LOW (ref 135–145)
Total Bilirubin: 0.8 mg/dL (ref 0.0–1.2)
Total Protein: 6.5 g/dL (ref 6.5–8.1)

## 2023-09-30 LAB — CBC
HCT: 40.3 % (ref 39.0–52.0)
Hemoglobin: 13.7 g/dL (ref 13.0–17.0)
MCH: 29.7 pg (ref 26.0–34.0)
MCHC: 34 g/dL (ref 30.0–36.0)
MCV: 87.4 fL (ref 80.0–100.0)
Platelets: 206 10*3/uL (ref 150–400)
RBC: 4.61 MIL/uL (ref 4.22–5.81)
RDW: 12.8 % (ref 11.5–15.5)
WBC: 7.4 10*3/uL (ref 4.0–10.5)
nRBC: 0 % (ref 0.0–0.2)

## 2023-09-30 LAB — TROPONIN I (HIGH SENSITIVITY)
Troponin I (High Sensitivity): 12 ng/L (ref <18)
Troponin I (High Sensitivity): 13 ng/L (ref <18)

## 2023-09-30 LAB — PROTIME-INR
INR: 1.1 (ref 0.8–1.2)
Prothrombin Time: 14.1 s (ref 11.4–15.2)

## 2023-09-30 LAB — CBG MONITORING, ED
Glucose-Capillary: 109 mg/dL — ABNORMAL HIGH (ref 70–99)
Glucose-Capillary: 109 mg/dL — ABNORMAL HIGH (ref 70–99)

## 2023-09-30 LAB — URINALYSIS, ROUTINE W REFLEX MICROSCOPIC
Bacteria, UA: NONE SEEN
Bilirubin Urine: NEGATIVE
Glucose, UA: 50 mg/dL — AB
Hgb urine dipstick: NEGATIVE
Ketones, ur: NEGATIVE mg/dL
Nitrite: NEGATIVE
Protein, ur: NEGATIVE mg/dL
Specific Gravity, Urine: 1.015 (ref 1.005–1.030)
pH: 6 (ref 5.0–8.0)

## 2023-09-30 MED ORDER — SODIUM CHLORIDE 0.9 % IV BOLUS
1000.0000 mL | Freq: Once | INTRAVENOUS | Status: AC
  Administered 2023-09-30: 1000 mL via INTRAVENOUS

## 2023-09-30 MED ORDER — DEXAMETHASONE SODIUM PHOSPHATE 4 MG/ML IJ SOLN
2.0000 mg | Freq: Three times a day (TID) | INTRAMUSCULAR | Status: DC
  Administered 2023-09-30 – 2023-10-03 (×9): 2 mg via INTRAVENOUS
  Filled 2023-09-30 (×9): qty 1

## 2023-09-30 MED ORDER — LEVOTHYROXINE SODIUM 50 MCG PO TABS
50.0000 ug | ORAL_TABLET | Freq: Every day | ORAL | Status: DC
  Administered 2023-10-01 – 2023-10-10 (×7): 50 ug via ORAL
  Filled 2023-09-30 (×8): qty 1

## 2023-09-30 MED ORDER — GADOBUTROL 1 MMOL/ML IV SOLN
8.0000 mL | Freq: Once | INTRAVENOUS | Status: AC | PRN
  Administered 2023-09-30: 8 mL via INTRAVENOUS

## 2023-09-30 MED ORDER — ACETAMINOPHEN 325 MG PO TABS
650.0000 mg | ORAL_TABLET | Freq: Four times a day (QID) | ORAL | Status: DC | PRN
  Administered 2023-10-06 – 2023-10-10 (×2): 650 mg via ORAL
  Filled 2023-09-30 (×2): qty 2

## 2023-09-30 MED ORDER — MORPHINE SULFATE (PF) 4 MG/ML IV SOLN
4.0000 mg | Freq: Once | INTRAVENOUS | Status: DC
  Filled 2023-09-30: qty 1

## 2023-09-30 MED ORDER — POLYETHYLENE GLYCOL 3350 17 G PO PACK
17.0000 g | PACK | Freq: Every day | ORAL | Status: DC | PRN
  Administered 2023-10-01 – 2023-10-10 (×4): 17 g via ORAL
  Filled 2023-09-30 (×4): qty 1

## 2023-09-30 MED ORDER — ALBUTEROL SULFATE (2.5 MG/3ML) 0.083% IN NEBU: 2.5000 mg | INHALATION_SOLUTION | Freq: Four times a day (QID) | RESPIRATORY_TRACT | Status: DC | PRN

## 2023-09-30 MED ORDER — PROCHLORPERAZINE EDISYLATE 10 MG/2ML IJ SOLN: 5.0000 mg | Freq: Four times a day (QID) | INTRAMUSCULAR | Status: DC | PRN

## 2023-09-30 MED ORDER — MELATONIN 5 MG PO TABS: 5.0000 mg | ORAL_TABLET | Freq: Every evening | ORAL | Status: DC | PRN

## 2023-09-30 MED ORDER — CALCIUM GLUCONATE-NACL 2-0.675 GM/100ML-% IV SOLN
2.0000 g | Freq: Once | INTRAVENOUS | Status: AC
  Administered 2023-09-30: 2000 mg via INTRAVENOUS
  Filled 2023-09-30 (×2): qty 100

## 2023-09-30 MED ORDER — SENNA 8.6 MG PO TABS
2.0000 | ORAL_TABLET | Freq: Once | ORAL | Status: AC
  Administered 2023-09-30: 17.2 mg via ORAL
  Filled 2023-09-30: qty 2

## 2023-09-30 MED ORDER — POTASSIUM CHLORIDE CRYS ER 20 MEQ PO TBCR
40.0000 meq | EXTENDED_RELEASE_TABLET | ORAL | Status: AC
  Administered 2023-09-30: 40 meq via ORAL
  Filled 2023-09-30: qty 2

## 2023-09-30 MED ORDER — POTASSIUM CHLORIDE CRYS ER 20 MEQ PO TBCR
20.0000 meq | EXTENDED_RELEASE_TABLET | Freq: Once | ORAL | Status: AC
  Administered 2023-09-30: 20 meq via ORAL
  Filled 2023-09-30: qty 1

## 2023-09-30 MED ORDER — ACETAMINOPHEN 650 MG RE SUPP: 650.0000 mg | Freq: Four times a day (QID) | RECTAL | Status: DC | PRN

## 2023-09-30 NOTE — ED Notes (Signed)
 Transport here for pt, 3W stated the RN receiving pt did not have their phone, they were notified that pt is being transported up.

## 2023-09-30 NOTE — ED Triage Notes (Addendum)
 Pt bib [facility] coming from home with weakness that's been getting progressively worse x1 week. Pt had an unwitnessed fall today around 1900. Pt wife noticed a change in mental status. Pt is currently alert and oriented but repetitive speaking. Able to follow commands with EMS. Pt not on blood thinners. Bruise and minimal controlled bleeding noted to lip. Pt has hx of brain tumor. Pt alert and oriented x1 during triage.   EMS VS: 152/100 76 HR 96% 115 cbg

## 2023-09-30 NOTE — ED Provider Notes (Signed)
 Patient's care assumed by me at 630.  Patient is pending MRI.  Patient had a CT scan which shows large hemorrhagic area left frontal convexity with 1.5 cm of rightward midline shift.  The provider before me had spoke with the neurosurgeon on-call who advised MRI.  He spoke with internal medicine consultant who request that they be reconsulted after MRI.  Patient is unable cannot will not consent for MRI.  I spoke with patient's wife, she states patient did not want MRI he reported he just wanted to be left alone.  She states he has been offered the option of surgery in the past and declined.  She reports he has minimally declined recently.  She is unsure if she would consent for patient to have an MRI.  I discussed what her wishes were.  She is not ready to talk about comfort care.  She states she is planning to come to the hospital.  And will talk to her children about future plans.  I discussed the patient with Dr. Ellery Guthrie neurosurgeon on-call who advised there is no reason for him to consult unless MRI is obtained.  Will reconsult medicine to admit patient.   Sandi Crosby, PA-C 09/30/23 1014    Arvilla Birmingham, MD 09/30/23 1409

## 2023-09-30 NOTE — ED Notes (Signed)
 Bladder scan volume: 

## 2023-09-30 NOTE — Progress Notes (Signed)
 EEG complete, results are pending.

## 2023-09-30 NOTE — H&P (Signed)
 History and Physical    Patient: Jesse [name] FAO:[id number] DOB: 03/13/1941 DOA: 09/30/2023 DOS: the patient was seen and examined on 09/30/2023 PCP: Rodney Clamp, MD  Patient coming from: Home via EMS  Chief Complaint:  Chief Complaint  Patient presents with   Weakness   Altered Mental Status   HPI: Jesse [name] is a 83 y.o. male with medical history significant of dementia, atrial fibrillation, hypothyroidism, meningioma, prostate cancer presents after having a fall at home.  History is obtained from his wife who is present at bedside.  Reportedly patient he has a known brain tumor that was initially measured at 4.1 cm in length as of February 27, 2023. He has previously refused surgical and radiation treatment options.  Yesterday, he experienced a fall at approximately 5:30 PM, resulting in swelling left side of his face, a loose tooth, and a significant skin tear of his left arm. His wife found him lying on his back over the walker, with the walker underneath him, and blood was noted on his shirt.  After the fall, he reported that it seemed that his legs were not working at all. His wife was concerned about a possible seizure. He does report recalling the fall.  His wife notes that last night he was repeatedly saying random numbers such as 'forty four, forty six, forty one,' and he was unable to recognize her or recall his name before the ambulance arrived. His mental status has improved since then.  In the emergency department patient was noted to be afebrile with pulse 57-76, respirations 13-23, blood pressures elevated up to 170/108, and O2 saturations maintained on room air.  Labs significant for CBC within normal limits, sodium 128, chloride 95, CO2 21,  and glucose 107.  CT imaging of the head and cervical spine revealed large 6.5 heterogeneous and hemorrhagic mass along the left frontal cortex with a 1.5 cm rightward midline shift significantly increased in size from previous check  in 2019 with 7 mm edema within the collection suggest acute/recent hemorrhage.  Patient had been given 1 L of normal saline IV fluids.  Neurosurgery had been consulted but recommended MRI.  Patient refused MRI.  Patient was given 1 L normal saline IV fluids.   Review of Systems: As mentioned in the history of present illness. All other systems reviewed and are negative. Past Medical History:  Diagnosis Date   Arthritis    Atrial fibrillation (HCC)    Complication of anesthesia    Atrial fibrillation during surgery    Dementia (HCC)    Depression    Hard of hearing    Hx of constipation    Osteoporosis    DEXA 11/09/10. Completed treatemtn with Fosamax.   Prostate cancer (HCC)    Restless legs    Thyroid  disease    hypothyroidism   Vitiligo    Past Surgical History:  Procedure Laterality Date   BACK SURGERY     COLONOSCOPY     FLEXIBLE SIGMOIDOSCOPY N/A 09/27/2017   Procedure: FLEXIBLE SIGMOIDOSCOPY;  Surgeon: Kenney Peacemaker, MD;  Location: [facility] ENDOSCOPY;  Service: Endoscopy;  Laterality: N/A;   JOINT REPLACEMENT     LAMINECTOMY  10/2011   cervical and lumbar laminectomy   PROSTATE BIOPSY  07/10/2011   PROSTATE BIOPSY  09/22/15   TOTAL HIP ARTHROPLASTY Right 02/29/2012   TOTAL HIP ARTHROPLASTY Right 07/04/2016   Procedure: RIGHT TOTAL HIP ARTHROPLASTY ANTERIOR APPROACH;  Surgeon: Adonica Hoose, MD;  Location: [facility] OR;  Service: Orthopedics;  Laterality: Right;   TRANSURETHRAL RESECTION OF PROSTATE     Social History:  reports that he quit smoking about 49 years ago. His smoking use included cigarettes. He has never used smokeless tobacco. He reports current alcohol use. He reports that he does not use drugs.  No Known Allergies  Family History  Problem Relation Age of Onset   Atrial fibrillation Mother    Cystic fibrosis Mother    Atrial fibrillation Sister    Cancer Neg Hx    Esophageal cancer Neg Hx    Colon cancer Neg Hx    Rectal cancer Neg Hx    Stomach cancer Neg Hx      Prior to Admission medications   Medication Sig Start Date End Date Taking? Authorizing Provider  Cyanocobalamin  (VITAMIN B 12 PO) Take 1,000 Units by mouth 3 (three) times a week.    Yes [provider]  SYNTHROID  50 MCG tablet TAKE 1 TABLET DAILY BEFORE BREAKFAST 07/18/21  Yes Rodney Clamp, MD    Physical Exam: Vitals:   09/30/23 0930 09/30/23 0933 09/30/23 0945 09/30/23 1000  BP: (!) 169/93  (!) 147/86 (!) 145/89  Pulse: 69  65 (!) 57  Resp: 17  20 13   Temp:  97.9 F (36.6 C)    TempSrc:      SpO2: 98%  95% 97%    Constitutional: Elderly male who appears to be in no acute distress at this time. Eyes: PERRL, bruising noted to the left side of the face. ENMT: Mucous membranes are moist. Posterior pharynx clear of any exudate or lesions.Normal dentition.  Neck: normal, supple,   Respiratory: clear to auscultation bilaterally, no wheezing, no crackles. Normal respiratory effort. No accessory muscle use.  Cardiovascular: Regular rate and rhythm, no murmurs / rubs / gallops. No extremity edema. 2+ pedal pulses.   Abdomen: no tenderness, no masses palpated. No hepatosplenomegaly. Bowel sounds positive.  Musculoskeletal: no clubbing / cyanosis. No joint deformity upper and lower extremities. Good ROM, no contractures. Normal muscle tone.  Skin: Skin tear currently bandaged and bruising noted of the left arm. Neurologic: CN 2-12 grossly intact.  Generalized weakness of the lower extremities noted to be 3/5 Psychiatric: Patient appears alert and oriented at least to person and place.  Data Reviewed:   EKG reveals sinus rhythm 82 bpm with premature atrial complexes.  Reviewed labs, imaging, and pertinent records as documented. Assessment and Plan:  Intracranial bleeding secondary to fall Brain mass Acute encephalopathy Patient presents after having an unwitnessed fall at home.  After fall patient reportedly was confused and initially not making sense.  CT imaging of  the head and cervical spine revealing large 6.5 cm heterogeneous and hemorrhagic mass along the left frontal cortex with a 1.5 cm rightward midline shift significantly increased in size from previous check in 2019 with 7 mm edema within the collection suggest acute/recent hemorrhage.  Records note history of a meningioma initially diagnosed back in 02/2023 previously noted to be around 4.1 cm.  Wife makes note that patient did not want any surgical or radiation therapy at that time.  Question the possibility of patient had a seizure as a reason for fall. - Admit to a progressive bed - Up with assistance - Check EEG - Check MRI of the brain with and without contrast - Decadron  2 mg IV q 8hr - Palliative care consulted - Neurosurgery was consulted,  will follow-up for any further recommendations  Hyponatremia (resolved) Hypochloremia(resolved) Hypokalemia Hypocalcemia Acute.  Sodium 128  and chloride 95.  Patient has been given 1 L normal saline IV fluids.  Repeat BMP noted sodium 135, potassium 3.3, chloride 108, and calcium 6.9. - Replacing electrolytes - Recheck BMP in a.m.  History of paroxysmal atrial fibrillation Patient with prior history of atrial fibrillation and postoperative setting of cervical laminectomy back in 2013 treated temporarily with amiodarone and anticoagulants.  History notes patient wore Holter monitor which showed brief paroxysmal atrial tachycardia for which medications were not continued.  Hypothyroidism -Check TSH -Continue levothyroxine   Dementia Records note aspect of dementia. - Delirium precautions  History of prostate cancer History of TURP and radiation  DVT prophylaxis: SCDs Advance Care Planning:   Code Status: Full Code    Consults: Neurosurgery and palliative care  Family Communication: Wife updated at bedside  Severity of Illness: The appropriate patient status for this patient is INPATIENT. Inpatient status is judged to be reasonable and  necessary in order to provide the required intensity of service to ensure the patient's safety. The patient's presenting symptoms, physical exam findings, and initial radiographic and laboratory data in the context of their chronic comorbidities is felt to place them at high risk for further clinical deterioration. Furthermore, it is not anticipated that the patient will be medically stable for discharge from the hospital within 2 midnights of admission.   * I certify that at the point of admission it is my clinical judgment that the patient will require inpatient hospital care spanning beyond 2 midnights from the point of admission due to high intensity of service, high risk for further deterioration and high frequency of surveillance required.*  Author: Lena Qualia, MD 09/30/2023 10:27 AM  For on call review [email or web address].

## 2023-09-30 NOTE — ED Notes (Signed)
 ED Provider at bedside.

## 2023-09-30 NOTE — Progress Notes (Signed)
 Pt arrived to unit via stretcher from ED with wife at bedside. Pt was transferred to bed and skinned was assessed by Leda Prude, RN and myself. Pt has scattered bruising on BLEs and a small bruise to left knee. Pt wife stated that these bruises were prior to fall and caused by a past medication. Pt wife concerned with new abdominal distention and this was reported by myself to C. Del Favia, MD. Pt has no c/o of pain or discomfort at this time. He is alert and oriented x2 to person and situation, and D.O.B. He is easily reoriented to place and time.  Eye are PERRLA and pt is resting quietly in bed. Wife has left bedside to go home for supplies and will return later this shift. Bed is in lowest position with wheels locked in place. Call light is within reach and pt was educated on use of call bell and asked to call for assistance. Pt was able to teach back. Will continue to monitor pt progress.

## 2023-09-30 NOTE — Procedures (Signed)
 Patient Name: Jesse [name]  MRN: [id number]  Epilepsy Attending: Arleene Lack  Referring Physician/Provider: Lena Qualia, MD  Date: 09/30/2023 Duration: 25.54 mins  Patient history: 83yo M with left frontal extra axial lesion. EEG to evaluate for seizure  Level of alertness: Awake  AEDs during EEG study: None  Technical aspects: This EEG study was done with scalp electrodes positioned according to the 10-20 International system of electrode placement. Electrical activity was reviewed with band pass filter of 1-70Hz , sensitivity of 7 uV/mm, display speed of 6mm/sec with a 60Hz  notched filter applied as appropriate. EEG data were recorded continuously and digitally stored.  Video monitoring was available and reviewed as appropriate.  Description: The posterior dominant rhythm consists of 9 Hz activity of moderate voltage (25-35 uV) seen predominantly in posterior head regions, symmetric and reactive to eye opening and eye closing. EEG showed continuous 3 to 6 Hz theta-delta slowing in left fronto-centro-temporal region. Hyperventilation and photic stimulation were not performed.     ABNORMALITY - Continuous slow, left fronto-centro-temporal region  IMPRESSION: This study is suggestive of cortical dysfunction arising from left fronto-centro-temporal region likely secondary to underlying structural abnormality. No seizures or epileptiform discharges were seen throughout the recording.  Adriann Thau O Myah Guynes

## 2023-09-30 NOTE — ED Provider Notes (Signed)
 [place] EMERGENCY DEPARTMENT AT [facility] Provider Note   CSN: [id number] Arrival date & time: 09/30/23  0112     History  No chief complaint on file.   Jesse [name] is a 83 y.o. male.  The history is provided by the EMS personnel and medical records. No language interpreter was used.     83 year old male history of prostate cancer.  Metastatic to brain, dementia, atrial fibrillation not on blood thinner, osteoporosis brought here via EMS from home with concern for fall.  Per EMS note, patient has been having progressive generalized weakness ongoing for the past week and he had an unwitnessed fall last night when EMS arrived, patient appears confused but able to follow command.  Patient is not on blood thinning medication.  History difficult to obtain due to his altered mental status.  Home Medications Prior to Admission medications   Medication Sig Start Date End Date Taking? Authorizing Provider  clotrimazole-betamethasone  (LOTRISONE) cream APPLY CREAM TOPICALLY TO AFFECTED AREA ONCE DAILY AS NEEDED 09/29/19   [provider]  Cyanocobalamin  (VITAMIN B 12 PO) Take 1,000 Units by mouth 3 (three) times a week.     [provider]  magnesium hydroxide (MILK OF MAGNESIA) 400 MG/5ML suspension Take by mouth daily as needed for mild constipation.    [provider]  Multiple Vitamins-Minerals (MULTIVITAMIN ADULT PO) Take 1 tablet by mouth daily. One a Day    [provider]  nystatin cream (MYCOSTATIN) APPLY CREAM TOPICALLY TWICE DAILY 03/03/19   [provider]  polyethylene glycol powder (MIRALAX ) 17 GM/SCOOP powder as needed.     [provider]  sildenafil (VIAGRA) 50 MG tablet Take 50 mg by mouth as needed.     [provider]  SYNTHROID  50 MCG tablet TAKE 1 TABLET DAILY BEFORE BREAKFAST 07/18/21   Rodney Clamp, MD      Allergies    Patient has no known allergies.    Review of Systems   Review of  Systems  Unable to perform ROS: Mental status change    Physical Exam Updated Vital Signs BP (!) 170/108 (BP Location: Right Arm)   Pulse 71   Temp (!) 97.5 F (36.4 C) (Oral)   Resp 18   SpO2 97%  Physical Exam Constitutional:      General: He is not in acute distress.    Appearance: He is well-developed.  HENT:     Head: Normocephalic.     Comments: Bruising noted to left lower lip and left cheek with tenderness to palpation.  No malocclusion no raccoons eyes or Battle sign Eyes:     Extraocular Movements: Extraocular movements intact.     Conjunctiva/sclera: Conjunctivae normal.     Pupils: Pupils are equal, round, and reactive to light.  Cardiovascular:     Rate and Rhythm: Rhythm irregular.     Pulses: Normal pulses.     Heart sounds: Normal heart sounds.  Pulmonary:     Effort: Pulmonary effort is normal.     Breath sounds: Normal breath sounds. No wheezing, rhonchi or rales.     Comments: Pectus excavatum Abdominal:     General: There is distension.     Tenderness: There is no abdominal tenderness.  Musculoskeletal:     Cervical back: Normal range of motion and neck supple.     Comments: Globally weak but able to move all 4 extremities with equal effort  Skin:    Findings: No rash.  Neurological:  Mental Status: He is alert.     GCS: GCS eye subscore is 4. GCS verbal subscore is 4. GCS motor subscore is 6.     Cranial Nerves: Dysarthria present.     Motor: Motor function is intact.     Comments: Gait not tested     ED Results / Procedures / Treatments   Labs (all labs ordered are listed, but only abnormal results are displayed) Labs Reviewed  COMPREHENSIVE METABOLIC PANEL WITH GFR - Abnormal; Notable for the following components:      Result Value   Sodium 128 (*)    Chloride 95 (*)    CO2 21 (*)    Glucose, Bld 103 (*)    All other components within normal limits  URINALYSIS, ROUTINE W REFLEX MICROSCOPIC - Abnormal; Notable for the following  components:   Glucose, UA 50 (*)    Leukocytes,Ua SMALL (*)    All other components within normal limits  CBG MONITORING, ED - Abnormal; Notable for the following components:   Glucose-Capillary 109 (*)    All other components within normal limits  CBG MONITORING, ED - Abnormal; Notable for the following components:   Glucose-Capillary 109 (*)    All other components within normal limits  CBC  PROTIME-INR  TROPONIN I (HIGH SENSITIVITY)  TROPONIN I (HIGH SENSITIVITY)    EKG EKG Interpretation Date/Time:  Monday Sep 30 2023 01:42:07 EDT Ventricular Rate:  82 PR Interval:  151 QRS Duration:  100 QT Interval:  406 QTC Calculation: 475 R Axis:   43  Text Interpretation: Sinus rhythm Atrial premature complex Borderline ST depression, anterolateral leads Confirmed by Kelsey Patricia [phone]) on 09/30/2023 1:54:28 AM  Radiology CT HEAD WO CONTRAST Addendum Date: 09/30/2023 ADDENDUM REPORT: 09/30/2023 03:26 ADDENDUM: Findings discussed with Dr. Monique Ano via telephone at 3:26 a.m. Electronically Signed   By: Stevenson Elbe M.D.   On: 09/30/2023 03:26   Result Date: 09/30/2023 CLINICAL DATA:  Polytrauma, blunt; Polytrauma, penetrating EXAM: CT HEAD WITHOUT CONTRAST CT MAXILLOFACIAL WITHOUT CONTRAST CT CERVICAL SPINE WITHOUT CONTRAST TECHNIQUE: Multidetector CT imaging of the head, cervical spine, and maxillofacial structures were performed using the standard protocol without intravenous contrast. Multiplanar CT image reconstructions of the cervical spine and maxillofacial structures were also generated. RADIATION DOSE REDUCTION: This exam was performed according to the departmental dose-optimization program which includes automated exposure control, adjustment of the mA and/or kV according to patient size and/or use of iterative reconstruction technique. COMPARISON:  MRI September 04, 2017 FINDINGS: CT HEAD FINDINGS Brain: Large (approximately 6.5 cm) heterogeneous and hemorrhagic extra-axial mass along  the left frontal convexity with significant mass effect and approximately 1.5 cm of rightward midline shift. Approximately 7 mm thick predominately predominantly low-density subdural fluid collection along the left cerebral convexity. Small amount of hyperdensity within the collection. No evidence of acute large vascular territory infarct or hydrocephalus. Vascular: Calcific atherosclerosis. Skull: No acute fracture. Other: No mastoid effusions. CT MAXILLOFACIAL FINDINGS Osseous: No fracture or mandibular dislocation. No destructive process. Orbits: Negative. No traumatic or inflammatory finding. Sinuses: Mostly clear. Soft tissues: Negative. CT CERVICAL SPINE FINDINGS Alignment: No substantial sagittal subluxation. Skull base and vertebrae: No acute fracture. Soft tissues and spinal canal: No prevertebral fluid or swelling. No visible canal hematoma. Disc levels:  Mild for age multilevel degenerative change. Upper chest: Clear sinuses. IMPRESSION: 1. Large (approximately 6.5 cm) heterogeneous and hemorrhagic extra-axial mass along the left frontal convexity with 1.5 cm of rightward midline shift, likely significant increase in size of the  lesion seen on 2019 MRI. Recommend MRI head with contrast and neurosurgery consultation. 2. Approximately 7 mm thick predominately predominantly low-density subdural fluid collection along the left cerebral convexity, likely chronic hematoma. Small amount of hyperdensity within the collection is suggestive of acute/recent hemorrhage. 3. No evidence of acute fracture or traumatic malalignment in the cervical spine. Electronically Signed: By: Stevenson Elbe M.D. On: 09/30/2023 03:15   CT Maxillofacial Wo Contrast Addendum Date: 09/30/2023 ADDENDUM REPORT: 09/30/2023 03:26 ADDENDUM: Findings discussed with Dr. Monique Ano via telephone at 3:26 a.m. Electronically Signed   By: Stevenson Elbe M.D.   On: 09/30/2023 03:26   Result Date: 09/30/2023 CLINICAL DATA:  Polytrauma, blunt;  Polytrauma, penetrating EXAM: CT HEAD WITHOUT CONTRAST CT MAXILLOFACIAL WITHOUT CONTRAST CT CERVICAL SPINE WITHOUT CONTRAST TECHNIQUE: Multidetector CT imaging of the head, cervical spine, and maxillofacial structures were performed using the standard protocol without intravenous contrast. Multiplanar CT image reconstructions of the cervical spine and maxillofacial structures were also generated. RADIATION DOSE REDUCTION: This exam was performed according to the departmental dose-optimization program which includes automated exposure control, adjustment of the mA and/or kV according to patient size and/or use of iterative reconstruction technique. COMPARISON:  MRI September 04, 2017 FINDINGS: CT HEAD FINDINGS Brain: Large (approximately 6.5 cm) heterogeneous and hemorrhagic extra-axial mass along the left frontal convexity with significant mass effect and approximately 1.5 cm of rightward midline shift. Approximately 7 mm thick predominately predominantly low-density subdural fluid collection along the left cerebral convexity. Small amount of hyperdensity within the collection. No evidence of acute large vascular territory infarct or hydrocephalus. Vascular: Calcific atherosclerosis. Skull: No acute fracture. Other: No mastoid effusions. CT MAXILLOFACIAL FINDINGS Osseous: No fracture or mandibular dislocation. No destructive process. Orbits: Negative. No traumatic or inflammatory finding. Sinuses: Mostly clear. Soft tissues: Negative. CT CERVICAL SPINE FINDINGS Alignment: No substantial sagittal subluxation. Skull base and vertebrae: No acute fracture. Soft tissues and spinal canal: No prevertebral fluid or swelling. No visible canal hematoma. Disc levels:  Mild for age multilevel degenerative change. Upper chest: Clear sinuses. IMPRESSION: 1. Large (approximately 6.5 cm) heterogeneous and hemorrhagic extra-axial mass along the left frontal convexity with 1.5 cm of rightward midline shift, likely significant increase in  size of the lesion seen on 2019 MRI. Recommend MRI head with contrast and neurosurgery consultation. 2. Approximately 7 mm thick predominately predominantly low-density subdural fluid collection along the left cerebral convexity, likely chronic hematoma. Small amount of hyperdensity within the collection is suggestive of acute/recent hemorrhage. 3. No evidence of acute fracture or traumatic malalignment in the cervical spine. Electronically Signed: By: Stevenson Elbe M.D. On: 09/30/2023 03:15   CT Cervical Spine Wo Contrast Addendum Date: 09/30/2023 ADDENDUM REPORT: 09/30/2023 03:26 ADDENDUM: Findings discussed with Dr. Monique Ano via telephone at 3:26 a.m. Electronically Signed   By: Stevenson Elbe M.D.   On: 09/30/2023 03:26   Result Date: 09/30/2023 CLINICAL DATA:  Polytrauma, blunt; Polytrauma, penetrating EXAM: CT HEAD WITHOUT CONTRAST CT MAXILLOFACIAL WITHOUT CONTRAST CT CERVICAL SPINE WITHOUT CONTRAST TECHNIQUE: Multidetector CT imaging of the head, cervical spine, and maxillofacial structures were performed using the standard protocol without intravenous contrast. Multiplanar CT image reconstructions of the cervical spine and maxillofacial structures were also generated. RADIATION DOSE REDUCTION: This exam was performed according to the departmental dose-optimization program which includes automated exposure control, adjustment of the mA and/or kV according to patient size and/or use of iterative reconstruction technique. COMPARISON:  MRI September 04, 2017 FINDINGS: CT HEAD FINDINGS Brain: Large (approximately 6.5 cm) heterogeneous and hemorrhagic extra-axial  mass along the left frontal convexity with significant mass effect and approximately 1.5 cm of rightward midline shift. Approximately 7 mm thick predominately predominantly low-density subdural fluid collection along the left cerebral convexity. Small amount of hyperdensity within the collection. No evidence of acute large vascular territory infarct or  hydrocephalus. Vascular: Calcific atherosclerosis. Skull: No acute fracture. Other: No mastoid effusions. CT MAXILLOFACIAL FINDINGS Osseous: No fracture or mandibular dislocation. No destructive process. Orbits: Negative. No traumatic or inflammatory finding. Sinuses: Mostly clear. Soft tissues: Negative. CT CERVICAL SPINE FINDINGS Alignment: No substantial sagittal subluxation. Skull base and vertebrae: No acute fracture. Soft tissues and spinal canal: No prevertebral fluid or swelling. No visible canal hematoma. Disc levels:  Mild for age multilevel degenerative change. Upper chest: Clear sinuses. IMPRESSION: 1. Large (approximately 6.5 cm) heterogeneous and hemorrhagic extra-axial mass along the left frontal convexity with 1.5 cm of rightward midline shift, likely significant increase in size of the lesion seen on 2019 MRI. Recommend MRI head with contrast and neurosurgery consultation. 2. Approximately 7 mm thick predominately predominantly low-density subdural fluid collection along the left cerebral convexity, likely chronic hematoma. Small amount of hyperdensity within the collection is suggestive of acute/recent hemorrhage. 3. No evidence of acute fracture or traumatic malalignment in the cervical spine. Electronically Signed: By: Stevenson Elbe M.D. On: 09/30/2023 03:15    Procedures .Critical Care  Performed by: Debbra Fairy, PA-C Authorized by: Debbra Fairy, PA-C   Critical care provider statement:    Critical care time (minutes):  30   Critical care was time spent personally by me on the following activities:  Development of treatment plan with patient or surrogate, discussions with consultants, evaluation of patient's response to treatment, examination of patient, ordering and review of laboratory studies, ordering and review of radiographic studies, ordering and performing treatments and interventions, pulse oximetry, re-evaluation of patient's condition and review of old charts      Medications Ordered in ED Medications  morphine  (PF) 4 MG/ML injection 4 mg (4 mg Intravenous Patient Refused/Not Given 09/30/23 0426)  sodium chloride  0.9 % bolus 1,000 mL (0 mLs Intravenous Stopped 09/30/23 0531)    ED Course/ Medical Decision Making/ A&P                                 Medical Decision Making Amount and/or Complexity of Data Reviewed Labs: ordered. Radiology: ordered.  Risk Prescription drug management.   BP (!) 142/85 (BP Location: Right Arm)   Pulse 77   Temp (!) 97.5 F (36.4 C) (Oral)   Resp 18   SpO2 97%   59:62 AM  83 year old male history of prostate cancer.  Metastatic to brain, dementia, atrial fibrillation not on blood thinner, osteoporosis brought here via EMS from home with concern for fall.  Per EMS note, patient has been having progressive generalized weakness ongoing for the past week and he had an unwitnessed fall last night when EMS arrived, patient appears confused but able to follow command.  Patient is not on blood thinning medication.  History difficult to obtain due to his altered mental status.  On exam patient with dysarthric speech.  Bruising to left side of face and left lower lip.  His left upper central canine is a bit loose.  Small skin tear noted to left elbow.  Strength globally weak but equal throughout.  Patient able to follows command.  No obvious facial droops or neglect.  Last known normal was  7 PM last night.  He is outside the window for TNK.  Wife is now at bedside and able to provide additional story.  Per wife, for the past week patient has had more difficulty with his gait and seems to be more weak than usual.  He had a fall a week ago without any significant injury and wife was able to get help him up.  States he fell when he was leaning over to try to pick up his socks.  There was no loss of consciousness.  It was witnessed by wife.  She was able to get help him up.  She mention that since then his gait has been a bit  off and he seem to be shuffling his gait more when he walks.  She went to [employer] last night and when she came home around 6 or 7 PM she found him on the floor next to his walker with his face injured.  She was unable to help him up and did reach out to have EMS to bring him here.  She mention that he had a large left-sided hemangioma that has increased in size but patient refused surgery or radiation.  -Labs ordered, independently viewed and interpreted by me.  Labs remarkable for sodium of 128, IV fluid given. -The patient was maintained on a cardiac monitor.  I personally viewed and interpreted the cardiac monitored which showed an underlying rhythm of: sinus rhythm -Imaging independently viewed and interpreted by me and I agree with radiologist's interpretation.  Result remarkable for head CT scan demonstrate a large approximately 6.5 cm heterogeneous and hemorrhagic extra-axial mass along the left frontal convexity with a 1.5 cm rightward midline shift significantly increased in size compared to prior lesion seen in 2019.  I have ordered MRI of the head with contrast\ -This patient presents to the ED for concern of AMS, this involves an extensive number of treatment options, and is a complaint that carries with it a high risk of complications and morbidity.  The differential diagnosis includes hemorrhage stroke, delirium, ischemic stroke, infection, anemia, electrolytes imbalance -Co morbidities that complicate the patient evaluation includes meningioma, dementia -Treatment includes IVF. Morphine  ordered but pt refused and does not want pain med at this time.  -Reevaluation of the patient after these medicines showed that the patient stayed the same -PCP office notes or outside notes reviewed -Discussion with specialist neurosurgery Dr. Ellery Guthrie who recommend brain MRI and medicine admission.  He will be available for consultation.  Care discussed with Dr. Monique Ano. I have also consulted with [facility] Dr. [facility] Lavender who felt brain MRI should resulted first and for Dr. Ellery Guthrie to decide from the result.  Hospitalist do not feel comfortable admitting pt at this time. -Escalation to admission/observation considered:will be admitted  6:27 AM Pt sign out to oncoming team who will f/u on brain MRI result, and reconsult neurosurgery Dr. Ellery Guthrie for recommendation.         Final Clinical Impression(s) / ED Diagnoses Final diagnoses:  Traumatic intracranial hemorrhage with unknown loss of consciousness status, initial encounter [facility])    Rx / DC Orders ED Discharge Orders     None         Debbra Fairy, PA-C 09/30/23 0645    Kelsey Patricia, MD 09/30/23 2332

## 2023-10-01 DIAGNOSIS — S062XAA Diffuse traumatic brain injury with loss of consciousness status unknown, initial encounter: Secondary | ICD-10-CM

## 2023-10-01 DIAGNOSIS — R531 Weakness: Secondary | ICD-10-CM

## 2023-10-01 DIAGNOSIS — S065XAA Traumatic subdural hemorrhage with loss of consciousness status unknown, initial encounter: Secondary | ICD-10-CM | POA: Diagnosis not present

## 2023-10-01 DIAGNOSIS — D329 Benign neoplasm of meninges, unspecified: Secondary | ICD-10-CM

## 2023-10-01 DIAGNOSIS — R569 Unspecified convulsions: Secondary | ICD-10-CM | POA: Diagnosis not present

## 2023-10-01 DIAGNOSIS — Z515 Encounter for palliative care: Secondary | ICD-10-CM

## 2023-10-01 DIAGNOSIS — G9389 Other specified disorders of brain: Secondary | ICD-10-CM | POA: Diagnosis not present

## 2023-10-01 LAB — CBC
HCT: 41.3 % (ref 39.0–52.0)
Hemoglobin: 14.5 g/dL (ref 13.0–17.0)
MCH: 29.8 pg (ref 26.0–34.0)
MCHC: 35.1 g/dL (ref 30.0–36.0)
MCV: 84.8 fL (ref 80.0–100.0)
Platelets: 222 10*3/uL (ref 150–400)
RBC: 4.87 MIL/uL (ref 4.22–5.81)
RDW: 12.7 % (ref 11.5–15.5)
WBC: 7.6 10*3/uL (ref 4.0–10.5)
nRBC: 0 % (ref 0.0–0.2)

## 2023-10-01 LAB — BASIC METABOLIC PANEL WITH GFR
Anion gap: 6 (ref 5–15)
BUN: 12 mg/dL (ref 8–23)
CO2: 21 mmol/L — ABNORMAL LOW (ref 22–32)
Calcium: 9.1 mg/dL (ref 8.9–10.3)
Chloride: 105 mmol/L (ref 98–111)
Creatinine, Ser: 0.8 mg/dL (ref 0.61–1.24)
GFR, Estimated: >60 mL/min (ref >60)
Glucose, Bld: 134 mg/dL — ABNORMAL HIGH (ref 70–99)
Potassium: 4.4 mmol/L (ref 3.5–5.1)
Sodium: 132 mmol/L — ABNORMAL LOW (ref 135–145)

## 2023-10-01 LAB — TSH: TSH: 1.444 u[IU]/mL (ref 0.350–4.500)

## 2023-10-01 MED ORDER — LEVETIRACETAM 500 MG PO TABS
500.0000 mg | ORAL_TABLET | Freq: Two times a day (BID) | ORAL | Status: DC
  Administered 2023-10-01 – 2023-10-02 (×4): 500 mg via ORAL
  Filled 2023-10-01 (×5): qty 1

## 2023-10-01 MED ORDER — CHLORHEXIDINE GLUCONATE CLOTH 2 % EX PADS
6.0000 | MEDICATED_PAD | Freq: Every day | CUTANEOUS | Status: DC
Start: 2023-10-01 — End: 2023-10-10
  Administered 2023-10-01 – 2023-10-10 (×9): 6 via TOPICAL

## 2023-10-01 NOTE — Progress Notes (Signed)
 PROGRESS NOTE   Jesse [name]  ZOX:[id number]    DOB: [birth year]    DOA: 09/30/2023  PCP: Rodney Clamp, MD   I have briefly reviewed patients previous medical records in [facility].   Brief Hospital Course:  83 year old male with medical history significant for atrial fibrillation, dementia, depression, hard of hearing, prostate cancer, RLS, hypothyroid, meningioma presented to ED via EMS on 09/30/2023 after a fall at home.  The fall caused swelling on the left side of his face, he lost 3 of his mid upper teeth, spouse found him lying on his back over the walker with walker underneath him and blood on his shirt.  He reports that this was a mechanical fall.  He denies LOC.  Spouse also noted patient repeatedly saying random number such as "44, 46, 41" and was unable to recognize her or recall his name before ambulance arrived.  Mental status since then improved.  Patient has refused surgical intervention for previously known meningioma.  Admitted for large left frontal meningioma with mass effect, bilateral subdural hematoma.  Neurosurgery/Dr. Ellery Guthrie consulted and plans surgical intervention on 5/8.   Assessment & Plan:   Large left frontal meningioma with mass effect MRI brain: Large 6.1 cm extra-axial dural-based mass along the left frontal convexity, most likely an aggressive meningioma. Less likely considerations include hemangiopericytoma or dural metastasis. Resulting 1.3 cm of rightward midline shift. Neurosurgery/Dr. Ellery Guthrie consultation appreciated.  He discussed with patient, and family.  He plans surgical extirpation of the tumor on 5/8 via a left frontal craniotomy.  They are agreeable Patient remains on IV Decadron . As agreed with Dr. Ellery Guthrie, started Keppra 500 mg PO bid. See documentation below.  Bilateral subdural hematoma MRI brain: Left larger than right (6 mm in thickness on the left) cerebral convexity subdural collections, most likely subdural hematomas. Management per  neurosurgery.  Unwitnessed fall at home Multiple injuries as noted above PT has seen and recommend SNF for STR.  TOC consulted HS Troponin x 2: Negative.  UA not suggestive of UTI.  Acute encephalopathy/?  Seizure As per neurosurgery note, patient has had mental status changes for 1 to 2 weeks PTA. CT head, C-spine, maxillofacial and MRI brain results as below. EEG suggestive of cortical dysfunction arising from left frontal central temporal region likely secondary to underlying structural abnormality.  No seizures or epileptiform discharges were seen throughout the recording Seizure precautions Discussed with Dr. Ellery Guthrie and started Keppra 500 Mg twice daily for seizure prophylaxis.  Hyponatremia Mild.  Stable.?  SIADH Follow serum sodium periodically  Hypokalemia Replaced  History of paroxysmal A-fib Patient with prior history of atrial fibrillation in the postoperative setting of cervical laminectomy back in 2013, treated temporarily with amiodarone and anticoagulants.  History notes patient wore Holter monitor which showed brief paroxysmal atrial tachycardia for which medications were not continued.  Hypothyroidism Continue levothyroxine .  Check TSH.  Dementia Delirium precautions.  Prostate cancer S/p TURP and radiation.   Body mass index is 22.97 kg/m.   DVT prophylaxis: SCDs Start: 09/30/23 1137     Code Status: Full Code:  Family Communication: None at bedside. Disposition:  Status is: Inpatient Remains inpatient appropriate because: Upcoming brain surgery planned for 5/8     Consultants:   Neurosurgery/Dr. Ellery Guthrie  Procedures:     Subjective:  Patient seen this morning along with PT at bedside.  Alert and oriented x 2.  States that he tripped and fell.  Denies hitting his head or LOC.  Indicates he  lost his top 3 middle teeth.  Currently without pain or any complaints.  Aware that he is supposed to have brain surgery.  Objective:   Vitals:   10/01/23  0000 10/01/23 0400 10/01/23 0813 10/01/23 1159  BP: (!) 150/91 136/81 134/86 125/76  Pulse: 73 (!) 56 60 68  Resp: 16 16 19 20   Temp: 99.5 F (37.5 C) 98.2 F (36.8 C) 97.8 F (36.6 C) 98.3 F (36.8 C)  TempSrc: Axillary Axillary Axillary Axillary  SpO2: 95% 96% 97% 96%  Weight:      Height:        General exam: Elderly male, moderately built and nourished lying comfortably propped up in bed without distress. ENT: Missing middle 3 teeth of the upper jaw. Respiratory system: Clear to auscultation. Respiratory effort normal. Cardiovascular system: S1 & S2 heard, RRR. No JVD, murmurs, rubs, gallops or clicks. No pedal edema.  Telemetry personally reviewed: Sinus rhythm. Gastrointestinal system: Abdomen is nondistended, soft and nontender. No organomegaly or masses felt. Normal bowel sounds heard. Central nervous system: Alert and oriented x 2. No focal neurological deficits. Extremities: Symmetric 5 x 5 power. Skin: Bruising over left side of his face, both hands/forearms. Psychiatry: Judgement and insight appear impaired. Mood & affect flat    Data Reviewed:   I have personally reviewed following labs and imaging studies   CBC: Recent Labs  Lab 09/30/23 0135 10/01/23 0550  WBC 7.4 7.6  HGB 13.7 14.5  HCT 40.3 41.3  MCV 87.4 84.8  PLT 206 222    Basic Metabolic Panel: Recent Labs  Lab 09/30/23 0135 09/30/23 1711 10/01/23 0550  NA 128* 135 132*  K 4.1 3.3* 4.4  CL 95* 108 105  CO2 21* 19* 21*  GLUCOSE 103* 93 134*  BUN 17 8 12   CREATININE 0.93 0.63 0.80  CALCIUM 9.0 6.9* 9.1    Liver Function Tests: Recent Labs  Lab 09/30/23 0135  AST 32  ALT 18  ALKPHOS 67  BILITOT 0.8  PROT 6.5  ALBUMIN 4.0    CBG: Recent Labs  Lab 09/30/23 0146 09/30/23 0157  GLUCAP 109* 109*    Microbiology Studies:  No results found for this or any previous visit (from the past 240 hours).  Radiology Studies:  MR BRAIN W WO CONTRAST Result Date: 09/30/2023 CLINICAL  DATA:  Brain/CNS neoplasm, staging EXAM: MRI HEAD WITHOUT AND WITH CONTRAST TECHNIQUE: Multiplanar, multiecho pulse sequences of the brain and surrounding structures were obtained without and with intravenous contrast. CONTRAST:  8mL GADAVIST GADOBUTROL 1 MMOL/ML IV SOLN COMPARISON:  MRI head September 04, 2017. FINDINGS: Brain: Large (6.1 x 4.8 x 4.7 cm) enhancing and heterogeneous/hemorrhagic extra-axial dural-based mass along the left frontal convexity. Surrounding edema and mass effect with up to 1.3 cm rightward midline shift. Dural thickening and fluid collections along the left greater than right cerebral convexities, measuring up to 6 mm on the left and trace on the right. No evidence of acute infarct or hydrocephalus. Vascular: Major arterial flow voids are maintained skull base. Skull and upper cervical spine: Normal marrow signal. Sinuses/Orbits: Clear sinuses.  No acute orbital findings. IMPRESSION: 1. 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 Less likely considerations include hemangiopericytoma or dural metastasis. This may represent interval growth of the lesion seen on 2019 MRI. 2. Resulting 1.3 cm of rightward midline shift. 3. Left larger than right (6 mm in thickness on the left) cerebral convexity subdural collections, most likely subdural  hematomas. Electronically Signed   By: Stevenson Elbe M.D.   On: 09/30/2023 19:55   EEG adult Result Date: 09/30/2023 Arleene Lack, MD     09/30/2023  4:49 PM Patient Name: Jesse [name] MRN: [id number] Epilepsy Attending: Arleene Lack Referring Physician/Provider: Lena Qualia, MD Date: 09/30/2023 Duration: 25.54 mins Patient history: 83yo M with left frontal extra axial lesion. EEG to evaluate for seizure Level of alertness: Awake AEDs during EEG study: None Technical aspects: This EEG study was done with scalp electrodes positioned according to the 10-20 International system of  electrode placement. Electrical activity was reviewed with band pass filter of 1-70Hz , sensitivity of 7 uV/mm, display speed of 40mm/sec with a 60Hz  notched filter applied as appropriate. EEG data were recorded continuously and digitally stored.  Video monitoring was available and reviewed as appropriate. Description: The posterior dominant rhythm consists of 9 Hz activity of moderate voltage (25-35 uV) seen predominantly in posterior head regions, symmetric and reactive to eye opening and eye closing. EEG showed continuous 3 to 6 Hz theta-delta slowing in left fronto-centro-temporal region. Hyperventilation and photic stimulation were not performed.   ABNORMALITY - Continuous slow, left fronto-centro-temporal region IMPRESSION: This study is suggestive of cortical dysfunction arising from left fronto-centro-temporal region likely secondary to underlying structural abnormality. No seizures or epileptiform discharges were seen throughout the recording. Priyanka Suzanne Erps   MR ANGIO HEAD WO CONTRAST Result Date: 09/30/2023 CLINICAL DATA:  Neuro deficit, concern for stroke. EXAM: MRA HEAD WITHOUT CONTRAST TECHNIQUE: Angiographic images of the Circle of Willis were acquired using MRA technique without intravenous contrast. COMPARISON:  Earlier same day CT head.  MRI head 09/04/2017. FINDINGS: Anterior circulation: The intracranial internal carotid arteries are patent and appear normal in caliber. No evidence of high-grade stenosis. The M1 segment of the right MCA is patent. Right MCA bifurcation is patent. Patent M2 branches of the right MCA. Distal right MCA branches are patent. The left M1 segment is patent. Normal appearance of the left MCA bifurcation. Left M2 branches are patent. Distal left MCA branches are patent. The A1 and A2 segments are patent bilaterally. Distal ACA branches are patent. There is subtle mass effect on A4 and A5 branches bilaterally due to large extra-axial mass over the left frontal lobe.  Posterior circulation: The visualized intracranial vertebral arteries are patent. Right vertebral artery is dominant. The basilar artery is patent. Posterior communicating arteries noted bilaterally. The PCAs are patent bilaterally. The superior cerebellar arteries are patent bilaterally. AICA visualized on the right. PICA visualized bilaterally, more pronounced on the left. Anatomic variants: None significant. Other: Redemonstrated large extra-axial mass overlying the left frontal lobe. Left subdural collection better evaluated on same day head CT. IMPRESSION: Patent intracranial arterial vasculature. Mild mass effect on A4 and A5 branches of the ACA is secondary to large extra-axial mass over the left frontal lobe. No high-grade stenosis. Electronically Signed   By: Denny Flack M.D.   On: 09/30/2023 13:24   CT HEAD WO CONTRAST Addendum Date: 09/30/2023 ADDENDUM REPORT: 09/30/2023 03:26 ADDENDUM: Findings discussed with Dr. Monique Ano via telephone at 3:26 a.m. Electronically Signed   By: Stevenson Elbe M.D.   On: 09/30/2023 03:26   Result Date: 09/30/2023 CLINICAL DATA:  Polytrauma, blunt; Polytrauma, penetrating EXAM: CT HEAD WITHOUT CONTRAST CT MAXILLOFACIAL WITHOUT CONTRAST CT CERVICAL SPINE WITHOUT CONTRAST TECHNIQUE: Multidetector CT imaging of the head, cervical spine, and maxillofacial structures were performed using the standard protocol without intravenous contrast. Multiplanar CT image reconstructions of the  cervical spine and maxillofacial structures were also generated. RADIATION DOSE REDUCTION: This exam was performed according to the departmental dose-optimization program which includes automated exposure control, adjustment of the mA and/or kV according to patient size and/or use of iterative reconstruction technique. COMPARISON:  MRI September 04, 2017 FINDINGS: CT HEAD FINDINGS Brain: Large (approximately 6.5 cm) heterogeneous and hemorrhagic extra-axial mass along the left frontal convexity with  significant mass effect and approximately 1.5 cm of rightward midline shift. Approximately 7 mm thick predominately predominantly low-density subdural fluid collection along the left cerebral convexity. Small amount of hyperdensity within the collection. No evidence of acute large vascular territory infarct or hydrocephalus. Vascular: Calcific atherosclerosis. Skull: No acute fracture. Other: No mastoid effusions. CT MAXILLOFACIAL FINDINGS Osseous: No fracture or mandibular dislocation. No destructive process. Orbits: Negative. No traumatic or inflammatory finding. Sinuses: Mostly clear. Soft tissues: Negative. CT CERVICAL SPINE FINDINGS Alignment: No substantial sagittal subluxation. Skull base and vertebrae: No acute fracture. Soft tissues and spinal canal: No prevertebral fluid or swelling. No visible canal hematoma. Disc levels:  Mild for age multilevel degenerative change. Upper chest: Clear sinuses. IMPRESSION: 1. Large (approximately 6.5 cm) heterogeneous and hemorrhagic extra-axial mass along the left frontal convexity with 1.5 cm of rightward midline shift, likely significant increase in size of the lesion seen on 2019 MRI. Recommend MRI head with contrast and neurosurgery consultation. 2. Approximately 7 mm thick predominately predominantly low-density subdural fluid collection along the left cerebral convexity, likely chronic hematoma. Small amount of hyperdensity within the collection is suggestive of acute/recent hemorrhage. 3. No evidence of acute fracture or traumatic malalignment in the cervical spine. Electronically Signed: By: Stevenson Elbe M.D. On: 09/30/2023 03:15   CT Maxillofacial Wo Contrast Addendum Date: 09/30/2023 ADDENDUM REPORT: 09/30/2023 03:26 ADDENDUM: Findings discussed with Dr. Monique Ano via telephone at 3:26 a.m. Electronically Signed   By: Stevenson Elbe M.D.   On: 09/30/2023 03:26   Result Date: 09/30/2023 CLINICAL DATA:  Polytrauma, blunt; Polytrauma, penetrating EXAM: CT  HEAD WITHOUT CONTRAST CT MAXILLOFACIAL WITHOUT CONTRAST CT CERVICAL SPINE WITHOUT CONTRAST TECHNIQUE: Multidetector CT imaging of the head, cervical spine, and maxillofacial structures were performed using the standard protocol without intravenous contrast. Multiplanar CT image reconstructions of the cervical spine and maxillofacial structures were also generated. RADIATION DOSE REDUCTION: This exam was performed according to the departmental dose-optimization program which includes automated exposure control, adjustment of the mA and/or kV according to patient size and/or use of iterative reconstruction technique. COMPARISON:  MRI September 04, 2017 FINDINGS: CT HEAD FINDINGS Brain: Large (approximately 6.5 cm) heterogeneous and hemorrhagic extra-axial mass along the left frontal convexity with significant mass effect and approximately 1.5 cm of rightward midline shift. Approximately 7 mm thick predominately predominantly low-density subdural fluid collection along the left cerebral convexity. Small amount of hyperdensity within the collection. No evidence of acute large vascular territory infarct or hydrocephalus. Vascular: Calcific atherosclerosis. Skull: No acute fracture. Other: No mastoid effusions. CT MAXILLOFACIAL FINDINGS Osseous: No fracture or mandibular dislocation. No destructive process. Orbits: Negative. No traumatic or inflammatory finding. Sinuses: Mostly clear. Soft tissues: Negative. CT CERVICAL SPINE FINDINGS Alignment: No substantial sagittal subluxation. Skull base and vertebrae: No acute fracture. Soft tissues and spinal canal: No prevertebral fluid or swelling. No visible canal hematoma. Disc levels:  Mild for age multilevel degenerative change. Upper chest: Clear sinuses. IMPRESSION: 1. Large (approximately 6.5 cm) heterogeneous and hemorrhagic extra-axial mass along the left frontal convexity with 1.5 cm of rightward midline shift, likely significant increase in size of the  lesion seen on 2019  MRI. Recommend MRI head with contrast and neurosurgery consultation. 2. Approximately 7 mm thick predominately predominantly low-density subdural fluid collection along the left cerebral convexity, likely chronic hematoma. Small amount of hyperdensity within the collection is suggestive of acute/recent hemorrhage. 3. No evidence of acute fracture or traumatic malalignment in the cervical spine. Electronically Signed: By: Stevenson Elbe M.D. On: 09/30/2023 03:15   CT Cervical Spine Wo Contrast Addendum Date: 09/30/2023 ADDENDUM REPORT: 09/30/2023 03:26 ADDENDUM: Findings discussed with Dr. Monique Ano via telephone at 3:26 a.m. Electronically Signed   By: Stevenson Elbe M.D.   On: 09/30/2023 03:26   Result Date: 09/30/2023 CLINICAL DATA:  Polytrauma, blunt; Polytrauma, penetrating EXAM: CT HEAD WITHOUT CONTRAST CT MAXILLOFACIAL WITHOUT CONTRAST CT CERVICAL SPINE WITHOUT CONTRAST TECHNIQUE: Multidetector CT imaging of the head, cervical spine, and maxillofacial structures were performed using the standard protocol without intravenous contrast. Multiplanar CT image reconstructions of the cervical spine and maxillofacial structures were also generated. RADIATION DOSE REDUCTION: This exam was performed according to the departmental dose-optimization program which includes automated exposure control, adjustment of the mA and/or kV according to patient size and/or use of iterative reconstruction technique. COMPARISON:  MRI September 04, 2017 FINDINGS: CT HEAD FINDINGS Brain: Large (approximately 6.5 cm) heterogeneous and hemorrhagic extra-axial mass along the left frontal convexity with significant mass effect and approximately 1.5 cm of rightward midline shift. Approximately 7 mm thick predominately predominantly low-density subdural fluid collection along the left cerebral convexity. Small amount of hyperdensity within the collection. No evidence of acute large vascular territory infarct or hydrocephalus. Vascular:  Calcific atherosclerosis. Skull: No acute fracture. Other: No mastoid effusions. CT MAXILLOFACIAL FINDINGS Osseous: No fracture or mandibular dislocation. No destructive process. Orbits: Negative. No traumatic or inflammatory finding. Sinuses: Mostly clear. Soft tissues: Negative. CT CERVICAL SPINE FINDINGS Alignment: No substantial sagittal subluxation. Skull base and vertebrae: No acute fracture. Soft tissues and spinal canal: No prevertebral fluid or swelling. No visible canal hematoma. Disc levels:  Mild for age multilevel degenerative change. Upper chest: Clear sinuses. IMPRESSION: 1. Large (approximately 6.5 cm) heterogeneous and hemorrhagic extra-axial mass along the left frontal convexity with 1.5 cm of rightward midline shift, likely significant increase in size of the lesion seen on 2019 MRI. Recommend MRI head with contrast and neurosurgery consultation. 2. Approximately 7 mm thick predominately predominantly low-density subdural fluid collection along the left cerebral convexity, likely chronic hematoma. Small amount of hyperdensity within the collection is suggestive of acute/recent hemorrhage. 3. No evidence of acute fracture or traumatic malalignment in the cervical spine. Electronically Signed: By: Stevenson Elbe M.D. On: 09/30/2023 03:15    Scheduled Meds:    Chlorhexidine  Gluconate Cloth  6 each Topical Daily   dexamethasone  (DECADRON ) injection  2 mg Intravenous Q8H   levothyroxine   50 mcg Oral Q0600    Continuous Infusions:     LOS: 1 day     Aubrey Blas, MD,  FACP, [facility], [facility], [facility]   [facility] [place]      To contact the attending provider between 7A-7P or the covering provider during after hours 7P-7A, please log into the web site [email or web address] and access using universal  password for that web site. If you do not have the password, please call the hospital operator.  10/01/2023, 1:02 PM

## 2023-10-01 NOTE — TOC CM/SW Note (Signed)
 Transition of Care [facility]) - Inpatient Brief Assessment   Patient Details  Name: Jesse [name] MRN: [id number] Date of Birth: [birth year]  Transition of Care [facility]) CM/SW Contact:    Tandy Fam, LCSW Phone Number: 10/01/2023, 3:04 PM   Clinical Narrative:    Patient from home with spouse, required some assist with ADLs. CSW noting SNF recommendation at this time, but crani pending for 5/8 at this time, will need therapy evals again post-op. CSW to follow and complete full assessment when appropriate.    Transition of Care Asessment: Insurance and Status: Insurance coverage has been reviewed Patient has primary care physician: Yes Home environment has been reviewed: Home with spouse Prior level of function:: Requires assist Prior/Current Home Services: No current home services Social Drivers of Health Review: SDOH reviewed no interventions necessary Readmission risk has been reviewed: Yes Transition of care needs: transition of care needs identified, TOC will continue to follow

## 2023-10-01 NOTE — Plan of Care (Signed)
  Problem: Education: Goal: Knowledge of General Education information will improve Description: Including pain rating scale, medication(s)/side effects and non-pharmacologic comfort measures Outcome: Progressing   Problem: Clinical Measurements: Goal: Ability to maintain clinical measurements within normal limits will improve Outcome: Progressing Goal: Will remain free from infection Outcome: Progressing Goal: Diagnostic test results will improve Outcome: Progressing Goal: Respiratory complications will improve Outcome: Progressing   Problem: Activity: Goal: Risk for activity intolerance will decrease Outcome: Progressing   Problem: Nutrition: Goal: Adequate nutrition will be maintained Outcome: Progressing   Problem: Coping: Goal: Level of anxiety will decrease Outcome: Progressing   Problem: Elimination: Goal: Will not experience complications related to bowel motility Outcome: Progressing Goal: Will not experience complications related to urinary retention Outcome: Progressing   Problem: Pain Managment: Goal: General experience of comfort will improve and/or be controlled Outcome: Progressing   Problem: Safety: Goal: Ability to remain free from injury will improve Outcome: Progressing   Problem: Skin Integrity: Goal: Risk for impaired skin integrity will decrease Outcome: Progressing

## 2023-10-01 NOTE — H&P (View-Only) (Signed)
 Reason for Consult: Brain tumor and subdural hematoma Referring Physician: Dr. Burgess Jesse [name] is an 83 y.o. male.  HPI: The patient is an 83 year old right-handed individual who presented to the emergency department after having a fall and having a substantial change in mental status over the past 1 to 2 weeks time at home.  The patient struck the left side of his head.  A CT scan revealed the presence of a thin subdural hematoma over the left hemisphere however it also demonstrated the patient had a large 6 cm mass in the left frontal lobe.  An MRI had been done in 2019 which demonstrated that the mass was small measuring just a few centimeters in maximum diameter and only a centimeter in thickness.  It now occupies the most of the mass in the left frontal region of his brain.  The patient has a known history of a meningioma that was diagnosed initially in 2016.  He is over the years adamantly refused surgical intervention.  The patient's wife corroborates the story that he has been followed in [place]  by a neurosurgeon as recently as last year when the lesion measured about 4-1/2 cm in diameter and the surgeon had discussed the surgical extirpation of the lesion because he foot was felt to be clinically asymptomatic the patient refused.  It is apparent that over the past few weeks he has had a fairly acute deterioration in the recent fall precipitated this admission with a lesion now measuring over 6-1/2 cm in maximum diameter.  Substantial left-to-right shift.  Hard of this is due to the subdural but large part is due to the presence of the tumor.  An MRI was recently completed last night and this confirms the presence of the size of the lesion is mass effect and the thickness of the subdural.  I had an extensive discussion with the patient the patient's wife and the son via telephone.  The patient was initially quite resistant to any consideration of surgery but given the acute  deterioration and the pleating of his wife and his son he is agreeable to proceeding with surgical intervention to decompress the lesion and the subdural.  Past Medical History:  Diagnosis Date   Arthritis    Atrial fibrillation (HCC)    Complication of anesthesia    Atrial fibrillation during surgery    Dementia (HCC)    Depression    Hard of hearing    Hx of constipation    Osteoporosis    DEXA 11/09/10. Completed treatemtn with Fosamax.   Prostate cancer (HCC)    Restless legs    Thyroid  disease    hypothyroidism   Vitiligo     Past Surgical History:  Procedure Laterality Date   BACK SURGERY     COLONOSCOPY     FLEXIBLE SIGMOIDOSCOPY N/A 09/27/2017   Procedure: FLEXIBLE SIGMOIDOSCOPY;  Surgeon: Kenney Peacemaker, MD;  Location: [facility] ENDOSCOPY;  Service: Endoscopy;  Laterality: N/A;   JOINT REPLACEMENT     LAMINECTOMY  10/2011   cervical and lumbar laminectomy   PROSTATE BIOPSY  07/10/2011   PROSTATE BIOPSY  09/22/15   TOTAL HIP ARTHROPLASTY Right 02/29/2012   TOTAL HIP ARTHROPLASTY Right 07/04/2016   Procedure: RIGHT TOTAL HIP ARTHROPLASTY ANTERIOR APPROACH;  Surgeon: Adonica Hoose, MD;  Location: [facility] OR;  Service: Orthopedics;  Laterality: Right;   TRANSURETHRAL RESECTION OF PROSTATE      Family History  Problem Relation Age of Onset   Atrial fibrillation Mother  Cystic fibrosis Mother    Atrial fibrillation Sister    Cancer Neg Hx    Esophageal cancer Neg Hx    Colon cancer Neg Hx    Rectal cancer Neg Hx    Stomach cancer Neg Hx     Social History:  reports that he quit smoking about 49 years ago. His smoking use included cigarettes. He has never used smokeless tobacco. He reports current alcohol use. He reports that he does not use drugs.  Allergies: No Known Allergies  Medications: I have reviewed the patient's current medications.  Results for orders placed or performed during the hospital encounter of 09/30/23 (from the past 48 hours)  Comprehensive metabolic  panel     Status: Abnormal   Collection Time: 09/30/23  1:35 AM  Result Value Ref Range   Sodium 128 (L) 135 - 145 mmol/L   Potassium 4.1 3.5 - 5.1 mmol/L   Chloride 95 (L) 98 - 111 mmol/L   CO2 21 (L) 22 - 32 mmol/L   Glucose, Bld 103 (H) 70 - 99 mg/dL    Comment: Glucose reference range applies only to samples taken after fasting for at least 8 hours.   BUN 17 8 - 23 mg/dL   Creatinine, Ser 4.78 0.61 - 1.24 mg/dL   Calcium 9.0 8.9 - 29.5 mg/dL   Total Protein 6.5 6.5 - 8.1 g/dL   Albumin 4.0 3.5 - 5.0 g/dL   AST 32 15 - 41 U/L   ALT 18 0 - 44 U/L   Alkaline Phosphatase 67 38 - 126 U/L   Total Bilirubin 0.8 0.0 - 1.2 mg/dL   GFR, Estimated >62 >13 mL/min    Comment: (NOTE) Calculated using the CKD-EPI Creatinine Equation (2021)    Anion gap 12 5 - 15    Comment: Performed at [facility], [address] [place], [place] [address]  CBC     Status: None   Collection Time: 09/30/23  1:35 AM  Result Value Ref Range   WBC 7.4 4.0 - 10.5 K/uL   RBC 4.61 4.22 - 5.81 MIL/uL   Hemoglobin 13.7 13.0 - 17.0 g/dL   HCT 84.6 96.2 - 95.2 %   MCV 87.4 80.0 - 100.0 fL   MCH 29.7 26.0 - 34.0 pg   MCHC 34.0 30.0 - 36.0 g/dL   RDW 84.1 32.4 - 40.1 %   Platelets 206 150 - 400 K/uL   nRBC 0.0 0.0 - 0.2 %    Comment: Performed at [facility], [address] [place], [place] [address]  Urinalysis, Routine w reflex microscopic -Urine, Catheterized     Status: Abnormal   Collection Time: 09/30/23  1:35 AM  Result Value Ref Range   Color, Urine YELLOW YELLOW   APPearance CLEAR CLEAR   Specific Gravity, Urine 1.015 1.005 - 1.030   pH 6.0 5.0 - 8.0   Glucose, UA 50 (A) NEGATIVE mg/dL   Hgb urine dipstick NEGATIVE NEGATIVE   Bilirubin Urine NEGATIVE NEGATIVE   Ketones, ur NEGATIVE NEGATIVE mg/dL   Protein, ur NEGATIVE NEGATIVE mg/dL   Nitrite NEGATIVE NEGATIVE   Leukocytes,Ua SMALL (A) NEGATIVE   RBC / HPF 0-5 0 - 5 RBC/hpf   WBC, UA 6-10 0 - 5 WBC/hpf   Bacteria, UA NONE SEEN  NONE SEEN   Squamous Epithelial / HPF 6-10 0 - 5 /HPF   Mucus PRESENT     Comment: Performed at [facility], [address],  [place], [place] [address]  Troponin I (High Sensitivity)     Status: None   Collection Time: 09/30/23  1:35 AM  Result Value Ref Range   Troponin I (High Sensitivity) 12 <18 ng/L    Comment: (NOTE) Elevated high sensitivity troponin I (hsTnI) values and significant  changes across serial measurements may suggest ACS but many other  chronic and acute conditions are known to elevate hsTnI results.  Refer to the "Links" section for chest pain algorithms and additional  guidance. Performed at [facility], [address], [place], [place] [address]   CBG monitoring, ED     Status: Abnormal   Collection Time: 09/30/23  1:46 AM  Result Value Ref Range   Glucose-Capillary 109 (H) 70 - 99 mg/dL    Comment: Glucose reference range applies only to samples taken after fasting for at least 8 hours.  CBG monitoring, ED     Status: Abnormal   Collection Time: 09/30/23  1:57 AM  Result Value Ref Range   Glucose-Capillary 109 (H) 70 - 99 mg/dL    Comment: Glucose reference range applies only to samples taken after fasting for at least 8 hours.   Comment 1 Document in Chart   Protime-INR     Status: None   Collection Time: 09/30/23  3:17 AM  Result Value Ref Range   Prothrombin Time 14.1 11.4 - 15.2 seconds   INR 1.1 0.8 - 1.2    Comment: (NOTE) INR goal varies based on device and disease states. Performed at [facility], [address] [place], [place] [address]   Troponin I (High Sensitivity)     Status: None   Collection Time: 09/30/23  3:34 AM  Result Value Ref Range   Troponin I (High Sensitivity) 13 <18 ng/L    Comment: (NOTE) Elevated high sensitivity troponin I (hsTnI) values and significant  changes across serial measurements may suggest ACS but many other  chronic and acute conditions are known to elevate hsTnI results.  Refer to the  "Links" section for chest pain algorithms and additional  guidance. Performed at [facility], [address], [place], [place] [address]   Basic metabolic panel     Status: Abnormal   Collection Time: 09/30/23  5:11 PM  Result Value Ref Range   Sodium 135 135 - 145 mmol/L    Comment: DELTA CHECK NOTED   Potassium 3.3 (L) 3.5 - 5.1 mmol/L    Comment: HEMOLYSIS AT THIS LEVEL MAY AFFECT RESULT   Chloride 108 98 - 111 mmol/L   CO2 19 (L) 22 - 32 mmol/L   Glucose, Bld 93 70 - 99 mg/dL    Comment: Glucose reference range applies only to samples taken after fasting for at least 8 hours.   BUN 8 8 - 23 mg/dL   Creatinine, Ser 2.95 0.61 - 1.24 mg/dL   Calcium 6.9 (L) 8.9 - 10.3 mg/dL    Comment: DELTA CHECK NOTED   GFR, Estimated >60 >60 mL/min    Comment: (NOTE) Calculated using the CKD-EPI Creatinine Equation (2021)    Anion gap 8 5 - 15    Comment: Performed at [facility], [address] [place], [place] [address]  CBC     Status: None   Collection Time: 10/01/23  5:50 AM  Result Value Ref Range   WBC 7.6 4.0 - 10.5 K/uL   RBC 4.87 4.22 - 5.81 MIL/uL   Hemoglobin 14.5 13.0 - 17.0 g/dL  HCT 41.3 39.0 - 52.0 %   MCV 84.8 80.0 - 100.0 fL   MCH 29.8 26.0 - 34.0 pg   MCHC 35.1 30.0 - 36.0 g/dL   RDW 81.1 91.4 - 78.2 %   Platelets 222 150 - 400 K/uL   nRBC 0.0 0.0 - 0.2 %    Comment: Performed at [facility], [address], [place], [place] [address]  Basic metabolic panel     Status: Abnormal   Collection Time: 10/01/23  5:50 AM  Result Value Ref Range   Sodium 132 (L) 135 - 145 mmol/L   Potassium 4.4 3.5 - 5.1 mmol/L    Comment: DELTA CHECK NOTED   Chloride 105 98 - 111 mmol/L   CO2 21 (L) 22 - 32 mmol/L   Glucose, Bld 134 (H) 70 - 99 mg/dL    Comment: Glucose reference range applies only to samples taken after fasting for at least 8 hours.   BUN 12 8 - 23 mg/dL   Creatinine, Ser 3.08 0.61 - 1.24 mg/dL   Calcium 9.1 8.9 - 65.7 mg/dL    Comment:  DELTA CHECK NOTED   GFR, Estimated >60 >60 mL/min    Comment: (NOTE) Calculated using the CKD-EPI Creatinine Equation (2021)    Anion gap 6 5 - 15    Comment: Performed at [facility], [address] [place], [place] [address]    MR BRAIN W WO CONTRAST Result Date: 09/30/2023 CLINICAL DATA:  Brain/CNS neoplasm, staging EXAM: MRI HEAD WITHOUT AND WITH CONTRAST TECHNIQUE: Multiplanar, multiecho pulse sequences of the brain and surrounding structures were obtained without and with intravenous contrast. CONTRAST:  8mL GADAVIST GADOBUTROL 1 MMOL/ML IV SOLN COMPARISON:  MRI head September 04, 2017. FINDINGS: Brain: Large (6.1 x 4.8 x 4.7 cm) enhancing and heterogeneous/hemorrhagic extra-axial dural-based mass along the left frontal convexity. Surrounding edema and mass effect with up to 1.3 cm rightward midline shift. Dural thickening and fluid collections along the left greater than right cerebral convexities, measuring up to 6 mm on the left and trace on the right. No evidence of acute infarct or hydrocephalus. Vascular: Major arterial flow voids are maintained skull base. Skull and upper cervical spine: Normal marrow signal. Sinuses/Orbits: Clear sinuses.  No acute orbital findings. IMPRESSION: 1. 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 Less likely considerations include hemangiopericytoma or dural metastasis. This may represent interval growth of the lesion seen on 2019 MRI. 2. Resulting 1.3 cm of rightward midline shift. 3. Left larger than right (6 mm in thickness on the left) cerebral convexity subdural collections, most likely subdural hematomas. Electronically Signed   By: Stevenson Elbe M.D.   On: 09/30/2023 19:55   EEG adult Result Date: 09/30/2023 Arleene Lack, MD     09/30/2023  4:49 PM Patient Name: Jesse [name] MRN: [id number] Epilepsy Attending: Arleene Lack Referring Physician/Provider: Lena Qualia, MD  Date: 09/30/2023 Duration: 25.54 mins Patient history: 83yo M with left frontal extra axial lesion. EEG to evaluate for seizure Level of alertness: Awake AEDs during EEG study: None Technical aspects: This EEG study was done with scalp electrodes positioned according to the 10-20 International system of electrode placement. Electrical activity was reviewed with band pass filter of 1-70Hz , sensitivity of 7 uV/mm, display speed of 36mm/sec with a 60Hz  notched filter applied as appropriate. EEG data were recorded continuously and digitally stored.  Video monitoring was available and reviewed as  appropriate. Description: The posterior dominant rhythm consists of 9 Hz activity of moderate voltage (25-35 uV) seen predominantly in posterior head regions, symmetric and reactive to eye opening and eye closing. EEG showed continuous 3 to 6 Hz theta-delta slowing in left fronto-centro-temporal region. Hyperventilation and photic stimulation were not performed.   ABNORMALITY - Continuous slow, left fronto-centro-temporal region IMPRESSION: This study is suggestive of cortical dysfunction arising from left fronto-centro-temporal region likely secondary to underlying structural abnormality. No seizures or epileptiform discharges were seen throughout the recording. Priyanka Suzanne Erps   MR ANGIO HEAD WO CONTRAST Result Date: 09/30/2023 CLINICAL DATA:  Neuro deficit, concern for stroke. EXAM: MRA HEAD WITHOUT CONTRAST TECHNIQUE: Angiographic images of the Circle of Willis were acquired using MRA technique without intravenous contrast. COMPARISON:  Earlier same day CT head.  MRI head 09/04/2017. FINDINGS: Anterior circulation: The intracranial internal carotid arteries are patent and appear normal in caliber. No evidence of high-grade stenosis. The M1 segment of the right MCA is patent. Right MCA bifurcation is patent. Patent M2 branches of the right MCA. Distal right MCA branches are patent. The left M1 segment is patent. Normal  appearance of the left MCA bifurcation. Left M2 branches are patent. Distal left MCA branches are patent. The A1 and A2 segments are patent bilaterally. Distal ACA branches are patent. There is subtle mass effect on A4 and A5 branches bilaterally due to large extra-axial mass over the left frontal lobe. Posterior circulation: The visualized intracranial vertebral arteries are patent. Right vertebral artery is dominant. The basilar artery is patent. Posterior communicating arteries noted bilaterally. The PCAs are patent bilaterally. The superior cerebellar arteries are patent bilaterally. AICA visualized on the right. PICA visualized bilaterally, more pronounced on the left. Anatomic variants: None significant. Other: Redemonstrated large extra-axial mass overlying the left frontal lobe. Left subdural collection better evaluated on same day head CT. IMPRESSION: Patent intracranial arterial vasculature. Mild mass effect on A4 and A5 branches of the ACA is secondary to large extra-axial mass over the left frontal lobe. No high-grade stenosis. Electronically Signed   By: Denny Flack M.D.   On: 09/30/2023 13:24   CT HEAD WO CONTRAST Addendum Date: 09/30/2023 ADDENDUM REPORT: 09/30/2023 03:26 ADDENDUM: Findings discussed with Dr. Monique Ano via telephone at 3:26 a.m. Electronically Signed   By: Stevenson Elbe M.D.   On: 09/30/2023 03:26   Result Date: 09/30/2023 CLINICAL DATA:  Polytrauma, blunt; Polytrauma, penetrating EXAM: CT HEAD WITHOUT CONTRAST CT MAXILLOFACIAL WITHOUT CONTRAST CT CERVICAL SPINE WITHOUT CONTRAST TECHNIQUE: Multidetector CT imaging of the head, cervical spine, and maxillofacial structures were performed using the standard protocol without intravenous contrast. Multiplanar CT image reconstructions of the cervical spine and maxillofacial structures were also generated. RADIATION DOSE REDUCTION: This exam was performed according to the departmental dose-optimization program which includes automated  exposure control, adjustment of the mA and/or kV according to patient size and/or use of iterative reconstruction technique. COMPARISON:  MRI September 04, 2017 FINDINGS: CT HEAD FINDINGS Brain: Large (approximately 6.5 cm) heterogeneous and hemorrhagic extra-axial mass along the left frontal convexity with significant mass effect and approximately 1.5 cm of rightward midline shift. Approximately 7 mm thick predominately predominantly low-density subdural fluid collection along the left cerebral convexity. Small amount of hyperdensity within the collection. No evidence of acute large vascular territory infarct or hydrocephalus. Vascular: Calcific atherosclerosis. Skull: No acute fracture. Other: No mastoid effusions. CT MAXILLOFACIAL FINDINGS Osseous: No fracture or mandibular dislocation. No destructive process. Orbits: Negative. No traumatic or inflammatory finding. Sinuses: Mostly  clear. Soft tissues: Negative. CT CERVICAL SPINE FINDINGS Alignment: No substantial sagittal subluxation. Skull base and vertebrae: No acute fracture. Soft tissues and spinal canal: No prevertebral fluid or swelling. No visible canal hematoma. Disc levels:  Mild for age multilevel degenerative change. Upper chest: Clear sinuses. IMPRESSION: 1. Large (approximately 6.5 cm) heterogeneous and hemorrhagic extra-axial mass along the left frontal convexity with 1.5 cm of rightward midline shift, likely significant increase in size of the lesion seen on 2019 MRI. Recommend MRI head with contrast and neurosurgery consultation. 2. Approximately 7 mm thick predominately predominantly low-density subdural fluid collection along the left cerebral convexity, likely chronic hematoma. Small amount of hyperdensity within the collection is suggestive of acute/recent hemorrhage. 3. No evidence of acute fracture or traumatic malalignment in the cervical spine. Electronically Signed: By: Stevenson Elbe M.D. On: 09/30/2023 03:15   CT Maxillofacial Wo  Contrast Addendum Date: 09/30/2023 ADDENDUM REPORT: 09/30/2023 03:26 ADDENDUM: Findings discussed with Dr. Monique Ano via telephone at 3:26 a.m. Electronically Signed   By: Stevenson Elbe M.D.   On: 09/30/2023 03:26   Result Date: 09/30/2023 CLINICAL DATA:  Polytrauma, blunt; Polytrauma, penetrating EXAM: CT HEAD WITHOUT CONTRAST CT MAXILLOFACIAL WITHOUT CONTRAST CT CERVICAL SPINE WITHOUT CONTRAST TECHNIQUE: Multidetector CT imaging of the head, cervical spine, and maxillofacial structures were performed using the standard protocol without intravenous contrast. Multiplanar CT image reconstructions of the cervical spine and maxillofacial structures were also generated. RADIATION DOSE REDUCTION: This exam was performed according to the departmental dose-optimization program which includes automated exposure control, adjustment of the mA and/or kV according to patient size and/or use of iterative reconstruction technique. COMPARISON:  MRI September 04, 2017 FINDINGS: CT HEAD FINDINGS Brain: Large (approximately 6.5 cm) heterogeneous and hemorrhagic extra-axial mass along the left frontal convexity with significant mass effect and approximately 1.5 cm of rightward midline shift. Approximately 7 mm thick predominately predominantly low-density subdural fluid collection along the left cerebral convexity. Small amount of hyperdensity within the collection. No evidence of acute large vascular territory infarct or hydrocephalus. Vascular: Calcific atherosclerosis. Skull: No acute fracture. Other: No mastoid effusions. CT MAXILLOFACIAL FINDINGS Osseous: No fracture or mandibular dislocation. No destructive process. Orbits: Negative. No traumatic or inflammatory finding. Sinuses: Mostly clear. Soft tissues: Negative. CT CERVICAL SPINE FINDINGS Alignment: No substantial sagittal subluxation. Skull base and vertebrae: No acute fracture. Soft tissues and spinal canal: No prevertebral fluid or swelling. No visible canal hematoma. Disc  levels:  Mild for age multilevel degenerative change. Upper chest: Clear sinuses. IMPRESSION: 1. Large (approximately 6.5 cm) heterogeneous and hemorrhagic extra-axial mass along the left frontal convexity with 1.5 cm of rightward midline shift, likely significant increase in size of the lesion seen on 2019 MRI. Recommend MRI head with contrast and neurosurgery consultation. 2. Approximately 7 mm thick predominately predominantly low-density subdural fluid collection along the left cerebral convexity, likely chronic hematoma. Small amount of hyperdensity within the collection is suggestive of acute/recent hemorrhage. 3. No evidence of acute fracture or traumatic malalignment in the cervical spine. Electronically Signed: By: Stevenson Elbe M.D. On: 09/30/2023 03:15   CT Cervical Spine Wo Contrast Addendum Date: 09/30/2023 ADDENDUM REPORT: 09/30/2023 03:26 ADDENDUM: Findings discussed with Dr. Monique Ano via telephone at 3:26 a.m. Electronically Signed   By: Stevenson Elbe M.D.   On: 09/30/2023 03:26   Result Date: 09/30/2023 CLINICAL DATA:  Polytrauma, blunt; Polytrauma, penetrating EXAM: CT HEAD WITHOUT CONTRAST CT MAXILLOFACIAL WITHOUT CONTRAST CT CERVICAL SPINE WITHOUT CONTRAST TECHNIQUE: Multidetector CT imaging of the head, cervical spine, and maxillofacial  structures were performed using the standard protocol without intravenous contrast. Multiplanar CT image reconstructions of the cervical spine and maxillofacial structures were also generated. RADIATION DOSE REDUCTION: This exam was performed according to the departmental dose-optimization program which includes automated exposure control, adjustment of the mA and/or kV according to patient size and/or use of iterative reconstruction technique. COMPARISON:  MRI September 04, 2017 FINDINGS: CT HEAD FINDINGS Brain: Large (approximately 6.5 cm) heterogeneous and hemorrhagic extra-axial mass along the left frontal convexity with significant mass effect and  approximately 1.5 cm of rightward midline shift. Approximately 7 mm thick predominately predominantly low-density subdural fluid collection along the left cerebral convexity. Small amount of hyperdensity within the collection. No evidence of acute large vascular territory infarct or hydrocephalus. Vascular: Calcific atherosclerosis. Skull: No acute fracture. Other: No mastoid effusions. CT MAXILLOFACIAL FINDINGS Osseous: No fracture or mandibular dislocation. No destructive process. Orbits: Negative. No traumatic or inflammatory finding. Sinuses: Mostly clear. Soft tissues: Negative. CT CERVICAL SPINE FINDINGS Alignment: No substantial sagittal subluxation. Skull base and vertebrae: No acute fracture. Soft tissues and spinal canal: No prevertebral fluid or swelling. No visible canal hematoma. Disc levels:  Mild for age multilevel degenerative change. Upper chest: Clear sinuses. IMPRESSION: 1. Large (approximately 6.5 cm) heterogeneous and hemorrhagic extra-axial mass along the left frontal convexity with 1.5 cm of rightward midline shift, likely significant increase in size of the lesion seen on 2019 MRI. Recommend MRI head with contrast and neurosurgery consultation. 2. Approximately 7 mm thick predominately predominantly low-density subdural fluid collection along the left cerebral convexity, likely chronic hematoma. Small amount of hyperdensity within the collection is suggestive of acute/recent hemorrhage. 3. No evidence of acute fracture or traumatic malalignment in the cervical spine. Electronically Signed: By: Stevenson Elbe M.D. On: 09/30/2023 03:15    Review of Systems  Constitutional:  Positive for activity change and fatigue.  Musculoskeletal:  Positive for gait problem.  All other systems reviewed and are negative.  Blood pressure 134/86, pulse 60, temperature 97.8 F (36.6 C), temperature source Axillary, resp. rate 19, height 5\' 11"  (1.803 m), weight 74.7 kg, SpO2 97%. Physical  Exam Constitutional:      Appearance: Normal appearance.  HENT:     Head:     Comments: Left-sided facial trauma with acute bruising about the left cheek the swelling of the left lip and several teeth missing frontally secondary to his recent fall.    Right Ear: Tympanic membrane, ear canal and external ear normal.     Left Ear: Tympanic membrane, ear canal and external ear normal.     Nose: Nose normal.     Mouth/Throat:     Mouth: Mucous membranes are moist.     Pharynx: Oropharynx is clear.  Eyes:     Extraocular Movements: Extraocular movements intact.     Conjunctiva/sclera: Conjunctivae normal.     Pupils: Pupils are equal, round, and reactive to light.  Cardiovascular:     Rate and Rhythm: Normal rate and regular rhythm.     Pulses: Normal pulses.     Heart sounds: Normal heart sounds.  Musculoskeletal:        General: Normal range of motion.  Skin:    General: Skin is warm and dry.     Capillary Refill: Capillary refill takes less than 2 seconds.  Neurological:     Mental Status: He is alert.     Comments: Patient is awake and alert speaks in few words in brief sentences.  He is coherent.  He  admits at this time and is agreeable to proceeding with surgical intervention.  There is a mild right sided drift.  Lower extremity strength also reveals a drift.  There is hyperreflexia and a positive Babinski on the right.  Cranial nerve examination reveals some mild right facial weakness.  Tongue protrudes in the midline.     Assessment/Plan: Large left frontal meningioma with mass effect measuring greater than 6.5 cm in diameter.  Thin subdural with maximum thickness of about 9 mm over the left frontal convexity.  Substantial mass effect with left-to-right shift.  Plan the patient has been started on some low-dose of Decadron .  We will plan surgical extirpation of the tumor on Thursday morning via a left frontal craniotomy.  Kevin Pellant Delta Deshmukh 10/01/2023, 9:28 AM

## 2023-10-01 NOTE — Consult Note (Addendum)
 Reason for Consult: Brain tumor and subdural hematoma Referring Physician: Dr. Burgess Jesse [name] is an 83 y.o. male.  HPI: The patient is an 83 year old right-handed individual who presented to the emergency department after having a fall and having a substantial change in mental status over the past 1 to 2 weeks time at home.  The patient struck the left side of his head.  A CT scan revealed the presence of a thin subdural hematoma over the left hemisphere however it also demonstrated the patient had a large 6 cm mass in the left frontal lobe.  An MRI had been done in 2019 which demonstrated that the mass was small measuring just a few centimeters in maximum diameter and only a centimeter in thickness.  It now occupies the most of the mass in the left frontal region of his brain.  The patient has a known history of a meningioma that was diagnosed initially in 2016.  He is over the years adamantly refused surgical intervention.  The patient's wife corroborates the story that he has been followed in [place]  by a neurosurgeon as recently as last year when the lesion measured about 4-1/2 cm in diameter and the surgeon had discussed the surgical extirpation of the lesion because he foot was felt to be clinically asymptomatic the patient refused.  It is apparent that over the past few weeks he has had a fairly acute deterioration in the recent fall precipitated this admission with a lesion now measuring over 6-1/2 cm in maximum diameter.  Substantial left-to-right shift.  Hard of this is due to the subdural but large part is due to the presence of the tumor.  An MRI was recently completed last night and this confirms the presence of the size of the lesion is mass effect and the thickness of the subdural.  I had an extensive discussion with the patient the patient's wife and the son via telephone.  The patient was initially quite resistant to any consideration of surgery but given the acute  deterioration and the pleating of his wife and his son he is agreeable to proceeding with surgical intervention to decompress the lesion and the subdural.  Past Medical History:  Diagnosis Date   Arthritis    Atrial fibrillation (HCC)    Complication of anesthesia    Atrial fibrillation during surgery    Dementia (HCC)    Depression    Hard of hearing    Hx of constipation    Osteoporosis    DEXA 11/09/10. Completed treatemtn with Fosamax.   Prostate cancer (HCC)    Restless legs    Thyroid  disease    hypothyroidism   Vitiligo     Past Surgical History:  Procedure Laterality Date   BACK SURGERY     COLONOSCOPY     FLEXIBLE SIGMOIDOSCOPY N/A 09/27/2017   Procedure: FLEXIBLE SIGMOIDOSCOPY;  Surgeon: Kenney Peacemaker, MD;  Location: [facility] ENDOSCOPY;  Service: Endoscopy;  Laterality: N/A;   JOINT REPLACEMENT     LAMINECTOMY  10/2011   cervical and lumbar laminectomy   PROSTATE BIOPSY  07/10/2011   PROSTATE BIOPSY  09/22/15   TOTAL HIP ARTHROPLASTY Right 02/29/2012   TOTAL HIP ARTHROPLASTY Right 07/04/2016   Procedure: RIGHT TOTAL HIP ARTHROPLASTY ANTERIOR APPROACH;  Surgeon: Adonica Hoose, MD;  Location: [facility] OR;  Service: Orthopedics;  Laterality: Right;   TRANSURETHRAL RESECTION OF PROSTATE      Family History  Problem Relation Age of Onset   Atrial fibrillation Mother  Cystic fibrosis Mother    Atrial fibrillation Sister    Cancer Neg Hx    Esophageal cancer Neg Hx    Colon cancer Neg Hx    Rectal cancer Neg Hx    Stomach cancer Neg Hx     Social History:  reports that he quit smoking about 49 years ago. His smoking use included cigarettes. He has never used smokeless tobacco. He reports current alcohol use. He reports that he does not use drugs.  Allergies: No Known Allergies  Medications: I have reviewed the patient's current medications.  Results for orders placed or performed during the hospital encounter of 09/30/23 (from the past 48 hours)  Comprehensive metabolic  panel     Status: Abnormal   Collection Time: 09/30/23  1:35 AM  Result Value Ref Range   Sodium 128 (L) 135 - 145 mmol/L   Potassium 4.1 3.5 - 5.1 mmol/L   Chloride 95 (L) 98 - 111 mmol/L   CO2 21 (L) 22 - 32 mmol/L   Glucose, Bld 103 (H) 70 - 99 mg/dL    Comment: Glucose reference range applies only to samples taken after fasting for at least 8 hours.   BUN 17 8 - 23 mg/dL   Creatinine, Ser 4.78 0.61 - 1.24 mg/dL   Calcium 9.0 8.9 - 29.5 mg/dL   Total Protein 6.5 6.5 - 8.1 g/dL   Albumin 4.0 3.5 - 5.0 g/dL   AST 32 15 - 41 U/L   ALT 18 0 - 44 U/L   Alkaline Phosphatase 67 38 - 126 U/L   Total Bilirubin 0.8 0.0 - 1.2 mg/dL   GFR, Estimated >62 >13 mL/min    Comment: (NOTE) Calculated using the CKD-EPI Creatinine Equation (2021)    Anion gap 12 5 - 15    Comment: Performed at [facility], [address] [place], [place] [address]  CBC     Status: None   Collection Time: 09/30/23  1:35 AM  Result Value Ref Range   WBC 7.4 4.0 - 10.5 K/uL   RBC 4.61 4.22 - 5.81 MIL/uL   Hemoglobin 13.7 13.0 - 17.0 g/dL   HCT 84.6 96.2 - 95.2 %   MCV 87.4 80.0 - 100.0 fL   MCH 29.7 26.0 - 34.0 pg   MCHC 34.0 30.0 - 36.0 g/dL   RDW 84.1 32.4 - 40.1 %   Platelets 206 150 - 400 K/uL   nRBC 0.0 0.0 - 0.2 %    Comment: Performed at [facility], [address] [place], [place] [address]  Urinalysis, Routine w reflex microscopic -Urine, Catheterized     Status: Abnormal   Collection Time: 09/30/23  1:35 AM  Result Value Ref Range   Color, Urine YELLOW YELLOW   APPearance CLEAR CLEAR   Specific Gravity, Urine 1.015 1.005 - 1.030   pH 6.0 5.0 - 8.0   Glucose, UA 50 (A) NEGATIVE mg/dL   Hgb urine dipstick NEGATIVE NEGATIVE   Bilirubin Urine NEGATIVE NEGATIVE   Ketones, ur NEGATIVE NEGATIVE mg/dL   Protein, ur NEGATIVE NEGATIVE mg/dL   Nitrite NEGATIVE NEGATIVE   Leukocytes,Ua SMALL (A) NEGATIVE   RBC / HPF 0-5 0 - 5 RBC/hpf   WBC, UA 6-10 0 - 5 WBC/hpf   Bacteria, UA NONE SEEN  NONE SEEN   Squamous Epithelial / HPF 6-10 0 - 5 /HPF   Mucus PRESENT     Comment: Performed at [facility], [address],  [place], [place] [address]  Troponin I (High Sensitivity)     Status: None   Collection Time: 09/30/23  1:35 AM  Result Value Ref Range   Troponin I (High Sensitivity) 12 <18 ng/L    Comment: (NOTE) Elevated high sensitivity troponin I (hsTnI) values and significant  changes across serial measurements may suggest ACS but many other  chronic and acute conditions are known to elevate hsTnI results.  Refer to the "Links" section for chest pain algorithms and additional  guidance. Performed at [facility], [address], [place], [place] [address]   CBG monitoring, ED     Status: Abnormal   Collection Time: 09/30/23  1:46 AM  Result Value Ref Range   Glucose-Capillary 109 (H) 70 - 99 mg/dL    Comment: Glucose reference range applies only to samples taken after fasting for at least 8 hours.  CBG monitoring, ED     Status: Abnormal   Collection Time: 09/30/23  1:57 AM  Result Value Ref Range   Glucose-Capillary 109 (H) 70 - 99 mg/dL    Comment: Glucose reference range applies only to samples taken after fasting for at least 8 hours.   Comment 1 Document in Chart   Protime-INR     Status: None   Collection Time: 09/30/23  3:17 AM  Result Value Ref Range   Prothrombin Time 14.1 11.4 - 15.2 seconds   INR 1.1 0.8 - 1.2    Comment: (NOTE) INR goal varies based on device and disease states. Performed at [facility], [address] [place], [place] [address]   Troponin I (High Sensitivity)     Status: None   Collection Time: 09/30/23  3:34 AM  Result Value Ref Range   Troponin I (High Sensitivity) 13 <18 ng/L    Comment: (NOTE) Elevated high sensitivity troponin I (hsTnI) values and significant  changes across serial measurements may suggest ACS but many other  chronic and acute conditions are known to elevate hsTnI results.  Refer to the  "Links" section for chest pain algorithms and additional  guidance. Performed at [facility], [address], [place], [place] [address]   Basic metabolic panel     Status: Abnormal   Collection Time: 09/30/23  5:11 PM  Result Value Ref Range   Sodium 135 135 - 145 mmol/L    Comment: DELTA CHECK NOTED   Potassium 3.3 (L) 3.5 - 5.1 mmol/L    Comment: HEMOLYSIS AT THIS LEVEL MAY AFFECT RESULT   Chloride 108 98 - 111 mmol/L   CO2 19 (L) 22 - 32 mmol/L   Glucose, Bld 93 70 - 99 mg/dL    Comment: Glucose reference range applies only to samples taken after fasting for at least 8 hours.   BUN 8 8 - 23 mg/dL   Creatinine, Ser 2.95 0.61 - 1.24 mg/dL   Calcium 6.9 (L) 8.9 - 10.3 mg/dL    Comment: DELTA CHECK NOTED   GFR, Estimated >60 >60 mL/min    Comment: (NOTE) Calculated using the CKD-EPI Creatinine Equation (2021)    Anion gap 8 5 - 15    Comment: Performed at [facility], [address] [place], [place] [address]  CBC     Status: None   Collection Time: 10/01/23  5:50 AM  Result Value Ref Range   WBC 7.6 4.0 - 10.5 K/uL   RBC 4.87 4.22 - 5.81 MIL/uL   Hemoglobin 14.5 13.0 - 17.0 g/dL  HCT 41.3 39.0 - 52.0 %   MCV 84.8 80.0 - 100.0 fL   MCH 29.8 26.0 - 34.0 pg   MCHC 35.1 30.0 - 36.0 g/dL   RDW 81.1 91.4 - 78.2 %   Platelets 222 150 - 400 K/uL   nRBC 0.0 0.0 - 0.2 %    Comment: Performed at [facility], [address], [place], [place] [address]  Basic metabolic panel     Status: Abnormal   Collection Time: 10/01/23  5:50 AM  Result Value Ref Range   Sodium 132 (L) 135 - 145 mmol/L   Potassium 4.4 3.5 - 5.1 mmol/L    Comment: DELTA CHECK NOTED   Chloride 105 98 - 111 mmol/L   CO2 21 (L) 22 - 32 mmol/L   Glucose, Bld 134 (H) 70 - 99 mg/dL    Comment: Glucose reference range applies only to samples taken after fasting for at least 8 hours.   BUN 12 8 - 23 mg/dL   Creatinine, Ser 3.08 0.61 - 1.24 mg/dL   Calcium 9.1 8.9 - 65.7 mg/dL    Comment:  DELTA CHECK NOTED   GFR, Estimated >60 >60 mL/min    Comment: (NOTE) Calculated using the CKD-EPI Creatinine Equation (2021)    Anion gap 6 5 - 15    Comment: Performed at [facility], [address] [place], [place] [address]    MR BRAIN W WO CONTRAST Result Date: 09/30/2023 CLINICAL DATA:  Brain/CNS neoplasm, staging EXAM: MRI HEAD WITHOUT AND WITH CONTRAST TECHNIQUE: Multiplanar, multiecho pulse sequences of the brain and surrounding structures were obtained without and with intravenous contrast. CONTRAST:  8mL GADAVIST GADOBUTROL 1 MMOL/ML IV SOLN COMPARISON:  MRI head September 04, 2017. FINDINGS: Brain: Large (6.1 x 4.8 x 4.7 cm) enhancing and heterogeneous/hemorrhagic extra-axial dural-based mass along the left frontal convexity. Surrounding edema and mass effect with up to 1.3 cm rightward midline shift. Dural thickening and fluid collections along the left greater than right cerebral convexities, measuring up to 6 mm on the left and trace on the right. No evidence of acute infarct or hydrocephalus. Vascular: Major arterial flow voids are maintained skull base. Skull and upper cervical spine: Normal marrow signal. Sinuses/Orbits: Clear sinuses.  No acute orbital findings. IMPRESSION: 1. 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 Less likely considerations include hemangiopericytoma or dural metastasis. This may represent interval growth of the lesion seen on 2019 MRI. 2. Resulting 1.3 cm of rightward midline shift. 3. Left larger than right (6 mm in thickness on the left) cerebral convexity subdural collections, most likely subdural hematomas. Electronically Signed   By: Stevenson Elbe M.D.   On: 09/30/2023 19:55   EEG adult Result Date: 09/30/2023 Arleene Lack, MD     09/30/2023  4:49 PM Patient Name: Jesse [name] MRN: [id number] Epilepsy Attending: Arleene Lack Referring Physician/Provider: Lena Qualia, MD  Date: 09/30/2023 Duration: 25.54 mins Patient history: 83yo M with left frontal extra axial lesion. EEG to evaluate for seizure Level of alertness: Awake AEDs during EEG study: None Technical aspects: This EEG study was done with scalp electrodes positioned according to the 10-20 International system of electrode placement. Electrical activity was reviewed with band pass filter of 1-70Hz , sensitivity of 7 uV/mm, display speed of 36mm/sec with a 60Hz  notched filter applied as appropriate. EEG data were recorded continuously and digitally stored.  Video monitoring was available and reviewed as  appropriate. Description: The posterior dominant rhythm consists of 9 Hz activity of moderate voltage (25-35 uV) seen predominantly in posterior head regions, symmetric and reactive to eye opening and eye closing. EEG showed continuous 3 to 6 Hz theta-delta slowing in left fronto-centro-temporal region. Hyperventilation and photic stimulation were not performed.   ABNORMALITY - Continuous slow, left fronto-centro-temporal region IMPRESSION: This study is suggestive of cortical dysfunction arising from left fronto-centro-temporal region likely secondary to underlying structural abnormality. No seizures or epileptiform discharges were seen throughout the recording. Priyanka Suzanne Erps   MR ANGIO HEAD WO CONTRAST Result Date: 09/30/2023 CLINICAL DATA:  Neuro deficit, concern for stroke. EXAM: MRA HEAD WITHOUT CONTRAST TECHNIQUE: Angiographic images of the Circle of Willis were acquired using MRA technique without intravenous contrast. COMPARISON:  Earlier same day CT head.  MRI head 09/04/2017. FINDINGS: Anterior circulation: The intracranial internal carotid arteries are patent and appear normal in caliber. No evidence of high-grade stenosis. The M1 segment of the right MCA is patent. Right MCA bifurcation is patent. Patent M2 branches of the right MCA. Distal right MCA branches are patent. The left M1 segment is patent. Normal  appearance of the left MCA bifurcation. Left M2 branches are patent. Distal left MCA branches are patent. The A1 and A2 segments are patent bilaterally. Distal ACA branches are patent. There is subtle mass effect on A4 and A5 branches bilaterally due to large extra-axial mass over the left frontal lobe. Posterior circulation: The visualized intracranial vertebral arteries are patent. Right vertebral artery is dominant. The basilar artery is patent. Posterior communicating arteries noted bilaterally. The PCAs are patent bilaterally. The superior cerebellar arteries are patent bilaterally. AICA visualized on the right. PICA visualized bilaterally, more pronounced on the left. Anatomic variants: None significant. Other: Redemonstrated large extra-axial mass overlying the left frontal lobe. Left subdural collection better evaluated on same day head CT. IMPRESSION: Patent intracranial arterial vasculature. Mild mass effect on A4 and A5 branches of the ACA is secondary to large extra-axial mass over the left frontal lobe. No high-grade stenosis. Electronically Signed   By: Denny Flack M.D.   On: 09/30/2023 13:24   CT HEAD WO CONTRAST Addendum Date: 09/30/2023 ADDENDUM REPORT: 09/30/2023 03:26 ADDENDUM: Findings discussed with Dr. Monique Ano via telephone at 3:26 a.m. Electronically Signed   By: Stevenson Elbe M.D.   On: 09/30/2023 03:26   Result Date: 09/30/2023 CLINICAL DATA:  Polytrauma, blunt; Polytrauma, penetrating EXAM: CT HEAD WITHOUT CONTRAST CT MAXILLOFACIAL WITHOUT CONTRAST CT CERVICAL SPINE WITHOUT CONTRAST TECHNIQUE: Multidetector CT imaging of the head, cervical spine, and maxillofacial structures were performed using the standard protocol without intravenous contrast. Multiplanar CT image reconstructions of the cervical spine and maxillofacial structures were also generated. RADIATION DOSE REDUCTION: This exam was performed according to the departmental dose-optimization program which includes automated  exposure control, adjustment of the mA and/or kV according to patient size and/or use of iterative reconstruction technique. COMPARISON:  MRI September 04, 2017 FINDINGS: CT HEAD FINDINGS Brain: Large (approximately 6.5 cm) heterogeneous and hemorrhagic extra-axial mass along the left frontal convexity with significant mass effect and approximately 1.5 cm of rightward midline shift. Approximately 7 mm thick predominately predominantly low-density subdural fluid collection along the left cerebral convexity. Small amount of hyperdensity within the collection. No evidence of acute large vascular territory infarct or hydrocephalus. Vascular: Calcific atherosclerosis. Skull: No acute fracture. Other: No mastoid effusions. CT MAXILLOFACIAL FINDINGS Osseous: No fracture or mandibular dislocation. No destructive process. Orbits: Negative. No traumatic or inflammatory finding. Sinuses: Mostly  clear. Soft tissues: Negative. CT CERVICAL SPINE FINDINGS Alignment: No substantial sagittal subluxation. Skull base and vertebrae: No acute fracture. Soft tissues and spinal canal: No prevertebral fluid or swelling. No visible canal hematoma. Disc levels:  Mild for age multilevel degenerative change. Upper chest: Clear sinuses. IMPRESSION: 1. Large (approximately 6.5 cm) heterogeneous and hemorrhagic extra-axial mass along the left frontal convexity with 1.5 cm of rightward midline shift, likely significant increase in size of the lesion seen on 2019 MRI. Recommend MRI head with contrast and neurosurgery consultation. 2. Approximately 7 mm thick predominately predominantly low-density subdural fluid collection along the left cerebral convexity, likely chronic hematoma. Small amount of hyperdensity within the collection is suggestive of acute/recent hemorrhage. 3. No evidence of acute fracture or traumatic malalignment in the cervical spine. Electronically Signed: By: Stevenson Elbe M.D. On: 09/30/2023 03:15   CT Maxillofacial Wo  Contrast Addendum Date: 09/30/2023 ADDENDUM REPORT: 09/30/2023 03:26 ADDENDUM: Findings discussed with Dr. Monique Ano via telephone at 3:26 a.m. Electronically Signed   By: Stevenson Elbe M.D.   On: 09/30/2023 03:26   Result Date: 09/30/2023 CLINICAL DATA:  Polytrauma, blunt; Polytrauma, penetrating EXAM: CT HEAD WITHOUT CONTRAST CT MAXILLOFACIAL WITHOUT CONTRAST CT CERVICAL SPINE WITHOUT CONTRAST TECHNIQUE: Multidetector CT imaging of the head, cervical spine, and maxillofacial structures were performed using the standard protocol without intravenous contrast. Multiplanar CT image reconstructions of the cervical spine and maxillofacial structures were also generated. RADIATION DOSE REDUCTION: This exam was performed according to the departmental dose-optimization program which includes automated exposure control, adjustment of the mA and/or kV according to patient size and/or use of iterative reconstruction technique. COMPARISON:  MRI September 04, 2017 FINDINGS: CT HEAD FINDINGS Brain: Large (approximately 6.5 cm) heterogeneous and hemorrhagic extra-axial mass along the left frontal convexity with significant mass effect and approximately 1.5 cm of rightward midline shift. Approximately 7 mm thick predominately predominantly low-density subdural fluid collection along the left cerebral convexity. Small amount of hyperdensity within the collection. No evidence of acute large vascular territory infarct or hydrocephalus. Vascular: Calcific atherosclerosis. Skull: No acute fracture. Other: No mastoid effusions. CT MAXILLOFACIAL FINDINGS Osseous: No fracture or mandibular dislocation. No destructive process. Orbits: Negative. No traumatic or inflammatory finding. Sinuses: Mostly clear. Soft tissues: Negative. CT CERVICAL SPINE FINDINGS Alignment: No substantial sagittal subluxation. Skull base and vertebrae: No acute fracture. Soft tissues and spinal canal: No prevertebral fluid or swelling. No visible canal hematoma. Disc  levels:  Mild for age multilevel degenerative change. Upper chest: Clear sinuses. IMPRESSION: 1. Large (approximately 6.5 cm) heterogeneous and hemorrhagic extra-axial mass along the left frontal convexity with 1.5 cm of rightward midline shift, likely significant increase in size of the lesion seen on 2019 MRI. Recommend MRI head with contrast and neurosurgery consultation. 2. Approximately 7 mm thick predominately predominantly low-density subdural fluid collection along the left cerebral convexity, likely chronic hematoma. Small amount of hyperdensity within the collection is suggestive of acute/recent hemorrhage. 3. No evidence of acute fracture or traumatic malalignment in the cervical spine. Electronically Signed: By: Stevenson Elbe M.D. On: 09/30/2023 03:15   CT Cervical Spine Wo Contrast Addendum Date: 09/30/2023 ADDENDUM REPORT: 09/30/2023 03:26 ADDENDUM: Findings discussed with Dr. Monique Ano via telephone at 3:26 a.m. Electronically Signed   By: Stevenson Elbe M.D.   On: 09/30/2023 03:26   Result Date: 09/30/2023 CLINICAL DATA:  Polytrauma, blunt; Polytrauma, penetrating EXAM: CT HEAD WITHOUT CONTRAST CT MAXILLOFACIAL WITHOUT CONTRAST CT CERVICAL SPINE WITHOUT CONTRAST TECHNIQUE: Multidetector CT imaging of the head, cervical spine, and maxillofacial  structures were performed using the standard protocol without intravenous contrast. Multiplanar CT image reconstructions of the cervical spine and maxillofacial structures were also generated. RADIATION DOSE REDUCTION: This exam was performed according to the departmental dose-optimization program which includes automated exposure control, adjustment of the mA and/or kV according to patient size and/or use of iterative reconstruction technique. COMPARISON:  MRI September 04, 2017 FINDINGS: CT HEAD FINDINGS Brain: Large (approximately 6.5 cm) heterogeneous and hemorrhagic extra-axial mass along the left frontal convexity with significant mass effect and  approximately 1.5 cm of rightward midline shift. Approximately 7 mm thick predominately predominantly low-density subdural fluid collection along the left cerebral convexity. Small amount of hyperdensity within the collection. No evidence of acute large vascular territory infarct or hydrocephalus. Vascular: Calcific atherosclerosis. Skull: No acute fracture. Other: No mastoid effusions. CT MAXILLOFACIAL FINDINGS Osseous: No fracture or mandibular dislocation. No destructive process. Orbits: Negative. No traumatic or inflammatory finding. Sinuses: Mostly clear. Soft tissues: Negative. CT CERVICAL SPINE FINDINGS Alignment: No substantial sagittal subluxation. Skull base and vertebrae: No acute fracture. Soft tissues and spinal canal: No prevertebral fluid or swelling. No visible canal hematoma. Disc levels:  Mild for age multilevel degenerative change. Upper chest: Clear sinuses. IMPRESSION: 1. Large (approximately 6.5 cm) heterogeneous and hemorrhagic extra-axial mass along the left frontal convexity with 1.5 cm of rightward midline shift, likely significant increase in size of the lesion seen on 2019 MRI. Recommend MRI head with contrast and neurosurgery consultation. 2. Approximately 7 mm thick predominately predominantly low-density subdural fluid collection along the left cerebral convexity, likely chronic hematoma. Small amount of hyperdensity within the collection is suggestive of acute/recent hemorrhage. 3. No evidence of acute fracture or traumatic malalignment in the cervical spine. Electronically Signed: By: Stevenson Elbe M.D. On: 09/30/2023 03:15    Review of Systems  Constitutional:  Positive for activity change and fatigue.  Musculoskeletal:  Positive for gait problem.  All other systems reviewed and are negative.  Blood pressure 134/86, pulse 60, temperature 97.8 F (36.6 C), temperature source Axillary, resp. rate 19, height 5\' 11"  (1.803 m), weight 74.7 kg, SpO2 97%. Physical  Exam Constitutional:      Appearance: Normal appearance.  HENT:     Head:     Comments: Left-sided facial trauma with acute bruising about the left cheek the swelling of the left lip and several teeth missing frontally secondary to his recent fall.    Right Ear: Tympanic membrane, ear canal and external ear normal.     Left Ear: Tympanic membrane, ear canal and external ear normal.     Nose: Nose normal.     Mouth/Throat:     Mouth: Mucous membranes are moist.     Pharynx: Oropharynx is clear.  Eyes:     Extraocular Movements: Extraocular movements intact.     Conjunctiva/sclera: Conjunctivae normal.     Pupils: Pupils are equal, round, and reactive to light.  Cardiovascular:     Rate and Rhythm: Normal rate and regular rhythm.     Pulses: Normal pulses.     Heart sounds: Normal heart sounds.  Musculoskeletal:        General: Normal range of motion.  Skin:    General: Skin is warm and dry.     Capillary Refill: Capillary refill takes less than 2 seconds.  Neurological:     Mental Status: He is alert.     Comments: Patient is awake and alert speaks in few words in brief sentences.  He is coherent.  He  admits at this time and is agreeable to proceeding with surgical intervention.  There is a mild right sided drift.  Lower extremity strength also reveals a drift.  There is hyperreflexia and a positive Babinski on the right.  Cranial nerve examination reveals some mild right facial weakness.  Tongue protrudes in the midline.     Assessment/Plan: Large left frontal meningioma with mass effect measuring greater than 6.5 cm in diameter.  Thin subdural with maximum thickness of about 9 mm over the left frontal convexity.  Substantial mass effect with left-to-right shift.  Plan the patient has been started on some low-dose of Decadron .  We will plan surgical extirpation of the tumor on Thursday morning via a left frontal craniotomy.  Kevin Pellant Delta Deshmukh 10/01/2023, 9:28 AM

## 2023-10-01 NOTE — Consult Note (Signed)
 Consultation Note Date: 10/01/2023   Patient Name: Jesse [name]  DOB: 1940-09-24  MRN: [id number]  Age / Sex: 83 y.o., male  PCP: Rodney Clamp, MD Referring Physician: Casey Clay, MD  Reason for Consultation: Establishing goals of care  HPI/Patient Profile: 83 y.o. male   admitted on 09/30/2023   after having a fall and having a substantial change in mental status.    Patient has refused surgical intervention for previously known meningioma. Admitted for large left frontal meningioma with mass effect, bilateral subdural hematoma. Neurosurgery/Dr. Ellery Guthrie consulted and plans surgical intervention on 5/8.   Patient and family face ongoing treatment option decisions, advanced directive decisions and anticipatory care needs.    Clinical Assessment and Goals of Care:  This NP Thena Fireman reviewed medical records, received report from team, assessed the patient and then meet at the patient's bedside along with his wife to discuss diagnosis, prognosis, GOC, EOL wishes disposition and options.   Concept of Palliative Care was introduced as specialized medical care for people and their families living with serious illness.  If focuses on providing relief from the symptoms and stress of a serious illness.  The goal is to improve quality of life for both the patient and the family.  Values and goals of care important to patient and family were attempted to be elicited.  Created space and opportunity for patient  and family to explore thoughts and feelings regarding current medical situation.  Patient appropriately engages in conversation, however his wife does most of the talking.  Patient and his wife moved from [place]  here to [place]  in 2017.  They have 4 children and 6 grandchildren.  Prior to this medical event they were actually looking to move back to [place] , however it seems like that  decision may be put on hold.  Their Catholic faith is very important to them both (I placed a spiritual care consult to contact a Catholic priest for patient and his wife)  She reports after a long conversation with Dr. Ellery Guthrie decision is made to move forward with surgical intervention on 5 /8.  They recognize the unknown and plan is to take it 1 day at a time.  Education offered today regarding  the importance of continued conversation with family and their  medical providers regarding overall plan of care and treatment options,  ensuring decisions are within the context of the patients values and GOCs.   A  discussion was had today regarding advanced directives.  Concepts specific to code status, artifical feeding and hydration, continued IV antibiotics and rehospitalization was had.   Wife believes there are documents reflecting advanced directives and H POA, she will look for them at home and bring them in for scanning.   Questions and concerns addressed.  Patient/family  encouraged to call with questions or concerns.     PMT will continue to support holistically.             NEXT OF KIN    SUMMARY OF  RECOMMENDATIONS    Code Status/Advance Care Planning: Full code   Symptom Management:  Per attending   Palliative Prophylaxis:  Delirium Protocol and Frequent Pain Assessment  Additional Recommendations (Limitations, Scope, Preferences): Full Scope Treatment  Psycho-social/Spiritual:  Desire for further Chaplaincy support:no Additional Recommendations: Emotional support  Prognosis:  Unable to determine  Discharge Planning: To Be Determined      Primary Diagnoses: Present on Admission:  Acute encephalopathy  Hyponatremia  Hypokalemia  Hypocalcemia  Paroxysmal atrial fibrillation (HCC)  Prostate cancer (HCC)  Mild dementia (HCC)  Hypothyroidism   I have reviewed the medical record, interviewed the patient and family, and examined the patient. The following  aspects are pertinent.  Past Medical History:  Diagnosis Date   Arthritis    Atrial fibrillation (HCC)    Complication of anesthesia    Atrial fibrillation during surgery    Dementia (HCC)    Depression    Hard of hearing    Hx of constipation    Osteoporosis    DEXA 11/09/10. Completed treatemtn with Fosamax.   Prostate cancer (HCC)    Restless legs    Thyroid  disease    hypothyroidism   Vitiligo    Social History   Socioeconomic History   Marital status: Married    Spouse name: Not on file   Number of children: Not on file   Years of education: Not on file   Highest education level: Not on file  Occupational History   Not on file  Tobacco Use   Smoking status: Former    Current packs/day: 0.00    Types: Cigarettes    Quit date: 05/28/1974    Years since quitting: 49.3   Smokeless tobacco: Never   Tobacco comments:    smoked very little   Vaping Use   Vaping status: Never Used  Substance and Sexual Activity   Alcohol use: Yes    Comment: 4-5 standard drinks per week/ stopped drinkin    Drug use: No   Sexual activity: Yes  Other Topics Concern   Not on file  Social History Narrative   Not on file   Social Drivers of Health   Financial Resource Strain: Low Risk  (08/14/2022)   Received from [facility], [facility]   Overall Financial Resource Strain (CARDIA)    Difficulty of Paying Living Expenses: Not hard at all  Food Insecurity: No Food Insecurity (08/14/2022)   Received from [facility], [facility]   Hunger Vital Sign    Worried About Running Out of Food in the Last Year: Never true    Ran Out of Food in the Last Year: Never true  Transportation Needs: No Transportation Needs (08/14/2022)   Received from [facility], [facility]   PRAPARE - Transportation    Lack of Transportation (Medical): No    Lack of Transportation (Non-Medical): No  Physical Activity: Sufficiently Active (03/12/2022)   Received from [facility], [facility]    Exercise Vital Sign    Days of Exercise per Week: 4 days    Minutes of Exercise per Session: 40 min  Stress: No Stress Concern Present (05/17/2022)   Received from [facility], [facility] - Occupational Stress Questionnaire    Feeling of Stress : Not at all  Social Connections: Unknown (03/24/2023)   Received from [facility]   Social Network    Social Network: Not on file   Family History  Problem Relation Age of Onset  Atrial fibrillation Mother    Cystic fibrosis Mother    Atrial fibrillation Sister    Cancer Neg Hx    Esophageal cancer Neg Hx    Colon cancer Neg Hx    Rectal cancer Neg Hx    Stomach cancer Neg Hx    Scheduled Meds:  dexamethasone  (DECADRON ) injection  2 mg Intravenous Q8H   levothyroxine   50 mcg Oral Q0600   Continuous Infusions: PRN Meds:.acetaminophen  **OR** acetaminophen , albuterol, melatonin, polyethylene glycol, prochlorperazine Medications Prior to Admission:  Prior to Admission medications   Medication Sig Start Date End Date Taking? Authorizing Provider  Cyanocobalamin  (VITAMIN B 12 PO) Take 1,000 Units by mouth 3 (three) times a week.    Yes [provider]  SYNTHROID  50 MCG tablet TAKE 1 TABLET DAILY BEFORE BREAKFAST 07/18/21  Yes Rodney Clamp, MD   No Known Allergies Review of Systems  Neurological:  Positive for weakness.    Physical Exam HENT:     Head:     Comments: Bruised lower jaw and missing teeth noted from fall  Cardiovascular:     Rate and Rhythm: Normal rate.  Pulmonary:     Effort: Pulmonary effort is normal.  Musculoskeletal:     Comments: Generalized weakness  Neurological:     Mental Status: He is alert.     Vital Signs: BP 134/86 (BP Location: Right Arm)   Pulse 60   Temp 97.8 F (36.6 C) (Axillary)   Resp 19   Ht 5\' 11"  (1.803 m)   Wt 74.7 kg   SpO2 97%   BMI 22.97 kg/m  Pain Scale: 0-10   Pain Score: Asleep   SpO2: SpO2: 97 % O2  Device:SpO2: 97 % O2 Flow Rate: .   IO: Intake/output summary:  Intake/Output Summary (Last 24 hours) at 10/01/2023 1025 Last data filed at 10/01/2023 0400 Gross per 24 hour  Intake --  Output 4300 ml  Net -4300 ml    LBM: Last BM Date : 09/28/23 Baseline Weight: Weight: 74.7 kg Most recent weight: Weight: 74.7 kg     Palliative Assessment/Data: 40 % at best     Time:   75 minutes  Discussed with Dr Sherrod Dolphin  Signed by: Thena Fireman, NP   Please contact Palliative Medicine Team phone at [phone] for questions and concerns.  For individual provider: See [name]

## 2023-10-01 NOTE — Evaluation (Signed)
 Physical Therapy Evaluation Patient Details Name: Jesse [name] MRN: [id number] DOB: 06-Jul-1940 Today's Date: 10/01/2023  History of Present Illness  Patient is 83 y.o. male presents after having a fall at home. CT of head and c-spine revealed large 6.5 cm heterogeneous and hemorrhagic mass along the Lt frontal cortex with a 1.5 cm rightward midline shift. This is significantly increased in size from imagine in 2019. PMH significant of dementia, atrial fibrillation, hypothyroidism, meningioma, prostate cancer.   Clinical Impression  Jesse [name] is 83 y.o. male admitted with above HPI and diagnosis. Patient is currently limited by functional impairments below (see PT problem list). Patient lives with spouse and per chart pt requires assist from spouse for ADLs and uses RW for mobility in home at baseline. No family present and pt unreliable historian. Currently he requires Mod assist for bed mobility and transfers with RW, HR stable in 80's throughout mobility. Pt transferred to recliner and back to bed then placed in chair position. Patient will benefit from continued skilled PT interventions to address impairments and progress independence with mobility. Patient will benefit from continued inpatient follow up therapy, <3 hours/day. Acute PT will follow and progress as able.         If plan is discharge home, recommend the following: Two people to help with walking and/or transfers;A lot of help with bathing/dressing/bathroom;Assistance with cooking/housework;Assistance with feeding;Direct supervision/assist for medications management;Help with stairs or ramp for entrance;Assist for transportation;Supervision due to cognitive status   Can travel by private vehicle   No    Equipment Recommendations Wheelchair (measurements PT);Wheelchair cushion (measurements PT);Hospital bed (TBD at next venue)  Recommendations for Other Services       Functional Status Assessment Patient has had a recent  decline in their functional status and demonstrates the ability to make significant improvements in function in a reasonable and predictable amount of time.     Precautions / Restrictions Precautions Precautions: Fall Restrictions Weight Bearing Restrictions Per Provider Order: No      Mobility  Bed Mobility Overal bed mobility: Needs Assistance Bed Mobility: Supine to Sit, Sit to Supine     Supine to sit: Mod assist, Used rails, HOB elevated Sit to supine: Mod assist, HOB elevated, Used rails   General bed mobility comments: step by step cues for sequencing LE's off EOB and to reach for bed rail. Mod assist to raise trunk and cues for anterior lean to maintain seated balance at EOB, Mod assist fading to CGA. mod for controlled return to supine.    Transfers Overall transfer level: Needs assistance Equipment used: Rolling walker (2 wheels) Transfers: Sit to/from Stand, Bed to chair/wheelchair/BSC Sit to Stand: Mod assist   Step pivot transfers: Mod assist       General transfer comment: Cues for anterior trunk lean to initiate stand. mod assist to power up and stabilize balance. mod assist to guide walker for steps bed<>chair. 2x transfer moving to chair then back to bed.    Ambulation/Gait                  Stairs            Wheelchair Mobility     Tilt Bed    Modified Rankin (Stroke Patients Only)       Balance Overall balance assessment: History of Falls, Needs assistance Sitting-balance support: Feet supported, Bilateral upper extremity supported Sitting balance-Leahy Scale: Poor     Standing balance support: Reliant on assistive device for balance, Bilateral upper  extremity supported Standing balance-Leahy Scale: Poor                               Pertinent Vitals/Pain Pain Assessment Pain Assessment: PAINAD Breathing: normal Negative Vocalization: none Facial Expression: smiling or inexpressive Body Language:  relaxed Consolability: no need to console PAINAD Score: 0 Pain Intervention(s): Monitored during session, Repositioned    Home Living Family/patient expects to be discharged to:: Private residence Living Arrangements: Spouse/significant other Available Help at Discharge: Family Type of Home: House Home Access: Level entry       Home Layout: Two level;1/2 bath on main level;Able to live on main level with bedroom/bathroom Home Equipment: Rolling Walker (2 wheels);BSC/3in1;Wheelchair - manual      Prior Function Prior Level of Function : Needs assist;Patient poor historian/Family not available             Mobility Comments: per chart and pt report pt uses RW for mobility in home ADLs Comments: per pt report requires assist from spouse for ADLs     Extremity/Trunk Assessment   Upper Extremity Assessment Upper Extremity Assessment: Defer to OT evaluation    Lower Extremity Assessment Lower Extremity Assessment: Generalized weakness    Cervical / Trunk Assessment Cervical / Trunk Assessment: Kyphotic  Communication   Communication Communication: Impaired Factors Affecting Communication: Hearing impaired    Cognition Arousal: Alert Behavior During Therapy: Flat affect   PT - Cognitive impairments: No family/caregiver present to determine baseline, Memory, Initiation, Sequencing, Problem solving, Safety/Judgement, Attention, Awareness                         Following commands: Impaired Following commands impaired: Follows one step commands inconsistently, Follows one step commands with increased time     Cueing Cueing Techniques: Verbal cues     General Comments      Exercises     Assessment/Plan    PT Assessment Patient needs continued PT services  PT Problem List Decreased strength;Decreased mobility;Decreased balance;Decreased activity tolerance;Decreased cognition;Decreased knowledge of use of DME;Decreased safety awareness;Decreased  knowledge of precautions       PT Treatment Interventions DME instruction;Gait training;Stair training;Functional mobility training;Therapeutic activities;Therapeutic exercise;Balance training;Neuromuscular re-education;Cognitive remediation;Patient/family education;Wheelchair mobility training    PT Goals (Current goals can be found in the Care Plan section)  Acute Rehab PT Goals Patient Stated Goal: none specified PT Goal Formulation: Patient unable to participate in goal setting Time For Goal Achievement: 10/15/23 Potential to Achieve Goals: Fair    Frequency Min 2X/week     Co-evaluation               AM-PAC PT "6 Clicks" Mobility  Outcome Measure Help needed turning from your back to your side while in a flat bed without using bedrails?: A Lot Help needed moving from lying on your back to sitting on the side of a flat bed without using bedrails?: A Lot Help needed moving to and from a bed to a chair (including a wheelchair)?: A Lot Help needed standing up from a chair using your arms (e.g., wheelchair or bedside chair)?: A Lot Help needed to walk in hospital room?: Total Help needed climbing 3-5 steps with a railing? : Total 6 Click Score: 10    End of Session Equipment Utilized During Treatment: Gait belt Activity Tolerance: Patient tolerated treatment well Patient left: in bed;with call bell/phone within reach;with bed alarm set;with SCD's reapplied;Other (comment) (chair position)  Nurse Communication: Mobility status PT Visit Diagnosis: Unsteadiness on feet (R26.81);Muscle weakness (generalized) (M62.81);Other abnormalities of gait and mobility (R26.89);History of falling (Z91.81);Difficulty in walking, not elsewhere classified (R26.2)    Time: 0865-7846 PT Time Calculation (min) (ACUTE ONLY): 24 min   Charges:   PT Evaluation $PT Eval Moderate Complexity: 1 Mod PT Treatments $Therapeutic Activity: 8-22 mins PT General Charges $$ ACUTE PT VISIT: 1 Visit          Tish Forge, DPT Acute Rehabilitation Services Office [phone]  10/01/23 11:45 AM

## 2023-10-02 DIAGNOSIS — I629 Nontraumatic intracranial hemorrhage, unspecified: Secondary | ICD-10-CM | POA: Diagnosis not present

## 2023-10-02 LAB — SURGICAL PCR SCREEN
MRSA, PCR: NEGATIVE
Staphylococcus aureus: NEGATIVE

## 2023-10-02 MED ORDER — MUPIROCIN 2 % EX OINT
1.0000 | TOPICAL_OINTMENT | Freq: Two times a day (BID) | CUTANEOUS | Status: DC
  Administered 2023-10-02 (×2): 1 via NASAL
  Filled 2023-10-02: qty 22

## 2023-10-02 MED ORDER — PANTOPRAZOLE SODIUM 40 MG PO TBEC
40.0000 mg | DELAYED_RELEASE_TABLET | Freq: Every day | ORAL | Status: DC
  Administered 2023-10-02: 40 mg via ORAL
  Filled 2023-10-02: qty 1

## 2023-10-02 NOTE — Progress Notes (Addendum)
 Jesse [name]  ZOX:[id number] DOB: Apr 24, 1941 DOA: 09/30/2023 PCP: Rodney Clamp, MD    Brief Narrative:  83 year old with a history of atrial fibrillation, dementia, depression, hearing impairment, prostate cancer, RLS, hypothyroidism, and known meningioma (previously declining surgery) who presented to the ED 09/30/2023 after a fall at home resulting in swelling of the left side of the face and knocking out 3 of his teeth.  His spouse found him lying on his back over his walker with blood on his shirt.  There was no apparent loss of consciousness.  The patient was confused and perseverating on random numbers.  Workup in the ER confirmed a large left frontal meningioma now exerting mass effect with bilateral subdural hematomas.  The patient was evaluated by Neurosurgery who recommended surgical excision and the patient/family have now agreed to proceed.  Goals of Care:   Code Status: Full Code   DVT prophylaxis: SCDs Start: 09/30/23 1137   Interim Hx: Afebrile.  Vital signs stable.  Resting comfortably in bed.  Tells me he is prepared for surgery tomorrow, and looking forward to getting it over with.  No chest pain or shortness of breath.  No new complaints today.  Assessment & Plan:  Large left frontal meningioma with mass effect/cerebral edema  MRI brain noted 6.1 cm extra-axial dural based mass along the left frontal convexity consistent with aggressive meningioma with 1.3 cm of rightward midline shift -Neurosurgery following -plan for surgical excision 5/8 via left frontal craniotomy -continue IV Decadron  as well as prophylactic Keppra  Bilateral subdural hematomas Noted on MRI brain with left being larger than right at 6 mm in thickness -for evacuation at time of craniotomy 5/8  Unwitnessed fall at home -left facial edema and dislodgment of 3 teeth Will likely need SNF for rehab stay after craniotomy  Encephalopathy Due to meningioma with increased intracranial pressure and midline  shift -history reveals mental status changes for 1 to 2 weeks prior to admission -no seizures noted during this admission with no acute seizure noted on EEG -continue prophylactic Keppra for now -is presently alert and conversant though affect is flat  Hyponatremia Mild and presently stable - monitor trend without intervention for now  Hypokalemia Corrected with supplementation - likely simply due to poor intake  Paroxysmal atrial fibrillation Occurred in 2013 following a cervical laminectomy - treated short-term with amiodarone and anticoagulation  Hypothyroidism Continue Synthroid   Dementia  Prostate cancer status post TURP and radiation   Family Communication: No family present at time of exam Disposition: Will depend upon performance postoperatively   Objective: Blood pressure 123/88, pulse 68, temperature 97.7 F (36.5 C), temperature source Oral, resp. rate 20, height 5\' 11"  (1.803 m), weight 74.7 kg, SpO2 96%.  Intake/Output Summary (Last 24 hours) at 10/02/2023 1012 Last data filed at 10/02/2023 0900 Gross per 24 hour  Intake 1140 ml  Output 4200 ml  Net -3060 ml   Filed Weights   09/30/23 2000  Weight: 74.7 kg    Examination: General: No acute respiratory distress Lungs: Clear to auscultation bilaterally without wheezes or crackles Cardiovascular: Regular rate and rhythm without murmur gallop or rub normal S1 and S2 Abdomen: Nontender, nondistended, soft, bowel sounds positive, no rebound, no ascites, no appreciable mass Extremities: No significant cyanosis, clubbing, or edema bilateral lower extremities  CBC: Recent Labs  Lab 09/30/23 0135 10/01/23 0550  WBC 7.4 7.6  HGB 13.7 14.5  HCT 40.3 41.3  MCV 87.4 84.8  PLT 206 222   Basic Metabolic  Panel: Recent Labs  Lab 09/30/23 0135 09/30/23 1711 10/01/23 0550  NA 128* 135 132*  K 4.1 3.3* 4.4  CL 95* 108 105  CO2 21* 19* 21*  GLUCOSE 103* 93 134*  BUN 17 8 12   CREATININE 0.93 0.63 0.80  CALCIUM  9.0 6.9* 9.1   GFR: Estimated Creatinine Clearance: 73.9 mL/min (by C-G formula based on SCr of 0.8 mg/dL).   Scheduled Meds:  Chlorhexidine  Gluconate Cloth  6 each Topical Daily   dexamethasone  (DECADRON ) injection  2 mg Intravenous Q8H   levETIRAcetam  500 mg Oral BID   levothyroxine   50 mcg Oral Q0600   mupirocin ointment  1 Application Nasal BID      LOS: 2 days   Abbe Abate, MD [facility] Office  [phone] Pager - Text Page per [name]  If 7PM-7AM, please contact night-coverage per Amion 10/02/2023, 10:12 AM

## 2023-10-02 NOTE — Plan of Care (Signed)
 ?  Problem: Clinical Measurements: ?Goal: Ability to maintain clinical measurements within normal limits will improve ?Outcome: Progressing ?Goal: Will remain free from infection ?Outcome: Progressing ?Goal: Diagnostic test results will improve ?Outcome: Progressing ?  ?

## 2023-10-02 NOTE — Progress Notes (Signed)
 Subjective: Patient reports no complaints this morning minimal headache  Objective: Vital signs in last 24 hours: Temp:  [97.7 F (36.5 C)-99 F (37.2 C)] 97.7 F (36.5 C) (05/07 0825) Pulse Rate:  [68-74] 68 (05/07 0825) Resp:  [18-20] 20 (05/07 0332) BP: (123-141)/(75-88) 123/88 (05/07 0825) SpO2:  [94 %-99 %] 96 % (05/07 0825)  Intake/Output from previous day: 05/06 0701 - 05/07 0700 In: 1140 [P.O.:1140] Out: 4200 [Urine:3000; Stool:1200] Intake/Output this shift: Total I/O In: 240 [P.O.:240] Out: -   Awake alert arousable moves all extremities well  Lab Results: Recent Labs    09/30/23 0135 10/01/23 0550  WBC 7.4 7.6  HGB 13.7 14.5  HCT 40.3 41.3  PLT 206 222   BMET Recent Labs    09/30/23 1711 10/01/23 0550  NA 135 132*  K 3.3* 4.4  CL 108 105  CO2 19* 21*  GLUCOSE 93 134*  BUN 8 12  CREATININE 0.63 0.80  CALCIUM 6.9* 9.1    Studies/Results: MR BRAIN W WO CONTRAST Result Date: 09/30/2023 CLINICAL DATA:  Brain/CNS neoplasm, staging EXAM: MRI HEAD WITHOUT AND WITH CONTRAST TECHNIQUE: Multiplanar, multiecho pulse sequences of the brain and surrounding structures were obtained without and with intravenous contrast. CONTRAST:  8mL GADAVIST GADOBUTROL 1 MMOL/ML IV SOLN COMPARISON:  MRI head September 04, 2017. FINDINGS: Brain: Large (6.1 x 4.8 x 4.7 cm) enhancing and heterogeneous/hemorrhagic extra-axial dural-based mass along the left frontal convexity. Surrounding edema and mass effect with up to 1.3 cm rightward midline shift. Dural thickening and fluid collections along the left greater than right cerebral convexities, measuring up to 6 mm on the left and trace on the right. No evidence of acute infarct or hydrocephalus. Vascular: Major arterial flow voids are maintained skull base. Skull and upper cervical spine: Normal marrow signal. Sinuses/Orbits: Clear sinuses.  No acute orbital findings. IMPRESSION: 1. Large 6.1 cm extra-axial dural-based mass along the left  frontal convexity, most likely an aggressive meningioma. Less likely considerations include hemangiopericytoma or dural metastasis. This may represent interval growth of the lesion seen on 2019 MRI. 2. Resulting 1.3 cm of rightward midline shift. 3. Left larger than right (6 mm in thickness on the left) cerebral convexity subdural collections, most likely subdural hematomas. Electronically Signed   By: Stevenson Elbe M.D.   On: 09/30/2023 19:55   EEG adult Result Date: 09/30/2023 Arleene Lack, MD     09/30/2023  4:49 PM Patient Name: Jesse [name] MRN: [id number] Epilepsy Attending: Arleene Lack Referring Physician/Provider: Lena Qualia, MD Date: 09/30/2023 Duration: 25.54 mins Patient history: 83yo M with left frontal extra axial lesion. EEG to evaluate for seizure Level of alertness: Awake AEDs during EEG study: None Technical aspects: This EEG study was done with scalp electrodes positioned according to the 10-20 International system of electrode placement. Electrical activity was reviewed with band pass filter of 1-70Hz , sensitivity of 7 uV/mm, display speed of 42mm/sec with a 60Hz  notched filter applied as appropriate. EEG data were recorded continuously and digitally stored.  Video monitoring was available and reviewed as appropriate. Description: The posterior dominant rhythm consists of 9 Hz activity of moderate voltage (25-35 uV) seen predominantly in posterior head regions, symmetric and reactive to eye opening and eye closing. EEG showed continuous 3 to 6 Hz theta-delta slowing in left fronto-centro-temporal region. Hyperventilation and photic stimulation were not performed.   ABNORMALITY - Continuous slow, left fronto-centro-temporal region IMPRESSION: This study is suggestive of cortical dysfunction arising from left fronto-centro-temporal region likely  secondary to underlying structural abnormality. No seizures or epileptiform discharges were seen throughout the recording. Priyanka Suzanne Erps   MR ANGIO HEAD WO CONTRAST Result Date: 09/30/2023 CLINICAL DATA:  Neuro deficit, concern for stroke. EXAM: MRA HEAD WITHOUT CONTRAST TECHNIQUE: Angiographic images of the Circle of Willis were acquired using MRA technique without intravenous contrast. COMPARISON:  Earlier same day CT head.  MRI head 09/04/2017. FINDINGS: Anterior circulation: The intracranial internal carotid arteries are patent and appear normal in caliber. No evidence of high-grade stenosis. The M1 segment of the right MCA is patent. Right MCA bifurcation is patent. Patent M2 branches of the right MCA. Distal right MCA branches are patent. The left M1 segment is patent. Normal appearance of the left MCA bifurcation. Left M2 branches are patent. Distal left MCA branches are patent. The A1 and A2 segments are patent bilaterally. Distal ACA branches are patent. There is subtle mass effect on A4 and A5 branches bilaterally due to large extra-axial mass over the left frontal lobe. Posterior circulation: The visualized intracranial vertebral arteries are patent. Right vertebral artery is dominant. The basilar artery is patent. Posterior communicating arteries noted bilaterally. The PCAs are patent bilaterally. The superior cerebellar arteries are patent bilaterally. AICA visualized on the right. PICA visualized bilaterally, more pronounced on the left. Anatomic variants: None significant. Other: Redemonstrated large extra-axial mass overlying the left frontal lobe. Left subdural collection better evaluated on same day head CT. IMPRESSION: Patent intracranial arterial vasculature. Mild mass effect on A4 and A5 branches of the ACA is secondary to large extra-axial mass over the left frontal lobe. No high-grade stenosis. Electronically Signed   By: Denny Flack M.D.   On: 09/30/2023 13:24    Assessment/Plan: 83 year old with large 6 cm left frontal meningioma apparently on the schedule for surgical excision tomorrow per Dr. Ellery Guthrie.   Neurologically stable make sure he is n.p.o.  LOS: 2 days     Jesse [name] 10/02/2023, 9:29 AM

## 2023-10-02 NOTE — Progress Notes (Signed)
 Inpatient Rehab Admissions Coordinator:   Per updated PT recommendations pt was screened for CIR by Loye Rumble, PT, DPT.  Note plans for operative intervention on brain mass tomorrow.  Will likely qualify for CIR.  Will rescreen after post op therapy session.    Loye Rumble, PT, DPT Admissions Coordinator [phone] 10/02/23  12:55 PM

## 2023-10-02 NOTE — Plan of Care (Signed)

## 2023-10-02 NOTE — Evaluation (Signed)
 Occupational Therapy Evaluation Patient Details Name: Jesse [name] MRN: [id number] DOB: 1940-11-02 Today's Date: 10/02/2023   History of Present Illness   Patient is 83 y.o. male presents after having a fall at home. CT of head and c-spine revealed large 6.5 cm heterogeneous and hemorrhagic mass along the Lt frontal cortex with a 1.5 cm rightward midline shift. This is significantly increased in size from imagine in 2019. PMH significant of dementia, atrial fibrillation, hypothyroidism, meningioma, prostate cancer.     Clinical Impressions Pt admitted for above, pt is a questionable historian but per chart review he reports having assist with ADLs from spouse and ambulating in home with RW. Pt currently more oriented today than on PT eval and ambulating with CGA + RW, does need initial assist with standing efforts, demonstrates slow processing. OT to continue following pt acutely to address listed deficits and help transition to next level of care. Patient will benefit from continued inpatient follow up therapy, <3 hours/day.      If plan is discharge home, recommend the following:   A little help with bathing/dressing/bathroom;Supervision due to cognitive status;Assist for transportation;Direct supervision/assist for financial management;Direct supervision/assist for medications management     Functional Status Assessment   Patient has had a recent decline in their functional status and demonstrates the ability to make significant improvements in function in a reasonable and predictable amount of time.     Equipment Recommendations   Tub/shower seat     Recommendations for Other Services         Precautions/Restrictions   Precautions Precautions: Fall Restrictions Weight Bearing Restrictions Per Provider Order: No     Mobility Bed Mobility Overal bed mobility: Needs Assistance Bed Mobility: Supine to Sit     Supine to sit: Min assist, HOB elevated     General  bed mobility comments: min A to raise trunk, pt left sitting in recliner    Transfers Overall transfer level: Needs assistance Equipment used: Rolling walker (2 wheels) Transfers: Sit to/from Stand Sit to Stand: Min assist           General transfer comment: light min A to power into standing.      Balance Overall balance assessment: History of Falls, Needs assistance Sitting-balance support: Feet supported, Bilateral upper extremity supported Sitting balance-Leahy Scale: Fair Sitting balance - Comments: CGA sitting EOB   Standing balance support: Reliant on assistive device for balance, Bilateral upper extremity supported Standing balance-Leahy Scale: Poor Standing balance comment: RW                           ADL either performed or assessed with clinical judgement   ADL Overall ADL's : Needs assistance/impaired Eating/Feeding: Set up;Sitting   Grooming: Standing;Contact guard assist   Upper Body Bathing: Contact guard assist;Sitting   Lower Body Bathing: Sitting/lateral leans;Minimal assistance   Upper Body Dressing : Sitting;Set up   Lower Body Dressing: Sit to/from stand;Minimal assistance   Toilet Transfer: Ambulation;Rolling walker (2 wheels);Contact guard assist   Toileting- Clothing Manipulation and Hygiene: Sit to/from stand;Moderate assistance       Functional mobility during ADLs: Contact guard assist;Rolling walker (2 wheels)       Vision Baseline Vision/History: 1 Wears glasses Patient Visual Report: No change from baseline Vision Assessment?: No apparent visual deficits     Perception         Praxis         Pertinent Vitals/Pain Pain Assessment Pain Assessment:  No/denies pain     Extremity/Trunk Assessment Upper Extremity Assessment Upper Extremity Assessment: Generalized weakness   Lower Extremity Assessment Lower Extremity Assessment: Generalized weakness   Cervical / Trunk Assessment Cervical / Trunk Assessment:  Kyphotic   Communication Communication Communication: Impaired Factors Affecting Communication: Hearing impaired   Cognition Arousal: Alert Behavior During Therapy: Flat affect               OT - Cognition Comments: hx of cognitive impairments at baseline.                 Following commands: Impaired Following commands impaired: Follows one step commands with increased time     Cueing  General Comments   Cueing Techniques: Verbal cues  VSS on RA   Exercises     Shoulder Instructions      Home Living Family/patient expects to be discharged to:: Private residence Living Arrangements: Spouse/significant other Available Help at Discharge: Family Type of Home: House Home Access: Level entry     Home Layout: Two level;1/2 bath on main level;Able to live on main level with bedroom/bathroom     Bathroom Shower/Tub: Walk-in shower (upstairs)   Bathroom Toilet: Handicapped height     Home Equipment: Agricultural consultant (2 wheels);BSC/3in1;Wheelchair - manual          Prior Functioning/Environment Prior Level of Function : Needs assist;Patient poor historian/Family not available             Mobility Comments: per chart and pt report pt uses RW for mobility in home ADLs Comments: per pt report requires assist from spouse for ADLs    OT Problem List: Impaired balance (sitting and/or standing);Decreased cognition   OT Treatment/Interventions: Self-care/ADL training;Patient/family education;Therapeutic exercise;Balance training;Therapeutic activities;DME and/or AE instruction      OT Goals(Current goals can be found in the care plan section)   Acute Rehab OT Goals Patient Stated Goal: none stated OT Goal Formulation: Patient unable to participate in goal setting Time For Goal Achievement: 10/16/23 Potential to Achieve Goals: Good ADL Goals Pt Will Perform Grooming: with supervision;standing Pt Will Perform Lower Body Bathing: with  set-up;sitting/lateral leans Pt Will Perform Lower Body Dressing: with set-up;with supervision;sit to/from stand Pt Will Transfer to Toilet: with supervision;ambulating   OT Frequency:  Min 2X/week    Co-evaluation              AM-PAC OT "6 Clicks" Daily Activity     Outcome Measure Help from another person eating meals?: A Little Help from another person taking care of personal grooming?: A Little Help from another person toileting, which includes using toliet, bedpan, or urinal?: A Lot Help from another person bathing (including washing, rinsing, drying)?: A Little Help from another person to put on and taking off regular upper body clothing?: A Little Help from another person to put on and taking off regular lower body clothing?: A Little 6 Click Score: 17   End of Session Equipment Utilized During Treatment: Gait belt;Rolling walker (2 wheels) Nurse Communication: Mobility status  Activity Tolerance: Patient tolerated treatment well Patient left: in chair;with call bell/phone within reach;with chair alarm set  OT Visit Diagnosis: Unsteadiness on feet (R26.81);Other abnormalities of gait and mobility (R26.89);Other symptoms and signs involving cognitive function                Time: 0912-0933 OT Time Calculation (min): 21 min Charges:  OT General Charges $OT Visit: 1 Visit OT Evaluation $OT Eval Low Complexity: 1 Low  10/02/2023  AB,  OTR/L  Acute Rehabilitation Services  Office: [phone]   Jorene New 10/02/2023, 12:02 PM

## 2023-10-02 NOTE — Progress Notes (Signed)
 This chaplain responded to PMT NP-Mary's consult for spiritual care. The chaplain began building rapport with the Pt. wife-[name] at the bedside.  The chaplain understands from communication with the PMT and the Pt. wife-[name] the Pt. is Catholic and requesting "Anointing of the Sick" before Thursday's surgery. The chaplain understands Father Marylou Sobers plans to visit the Pt. and family tonight between 7:00-7:30pm.  This chaplain is available for F/U spiritual care as needed.  Chaplain Kathleene Papas [phone]

## 2023-10-02 NOTE — Progress Notes (Addendum)
 Physical Therapy Treatment Patient Details Name: Jesse [name] MRN: [id number] DOB: February 16, 1941 Today's Date: 10/02/2023   History of Present Illness Patient is 83 y.o. male presents after having a fall at home. CT of head and c-spine revealed large 6.5 cm heterogeneous and hemorrhagic mass along the Lt frontal cortex with a 1.5 cm rightward midline shift. This is significantly increased in size from imagine in 2019. PMH significant of dementia, atrial fibrillation, hypothyroidism, meningioma, prostate cancer.    PT Comments  Pt is progressing well towards goals. Currently pt is Min A for bed mobility, sit to stand, gait and 2 steps. Pt has 17 steps he has to navigate at home with spouse. Pt requires intermittent verbal/tactile cues to initiate movement especially in bed. Due to pt current functional status, home set up and available assistance at home recommending skilled physical therapy services > 3 hours/day in order to address strength, balance and functional mobility to decrease risk for falls, injury, immobility, skin break down and re-hospitalization.       If plan is discharge home, recommend the following: Assistance with cooking/housework;Help with stairs or ramp for entrance;Assist for transportation;Supervision due to cognitive status;A little help with walking and/or transfers   Can travel by private vehicle     No  Equipment Recommendations  BSC/3in1;Rolling walker (2 wheels)       Precautions / Restrictions Precautions Precautions: Fall Restrictions Weight Bearing Restrictions Per Provider Order: No     Mobility  Bed Mobility Overal bed mobility: Needs Assistance Bed Mobility: Supine to Sit, Sit to Supine     Supine to sit: Min assist Sit to supine: Min assist   General bed mobility comments: min A to for trukn to mid line and LE to bed with tactile cues to initiate movement.    Transfers Overall transfer level: Needs assistance Equipment used: Rolling walker (2  wheels) Transfers: Sit to/from Stand Sit to Stand: Min assist           General transfer comment: light min A for momentum into standing.    Ambulation/Gait Ambulation/Gait assistance: Min assist Gait Distance (Feet): 150 Feet Assistive device: Rolling walker (2 wheels) Gait Pattern/deviations: Step-through pattern, Decreased stride length, Trunk flexed Gait velocity: decreased Gait velocity interpretation: <1.31 ft/sec, indicative of household ambulator   General Gait Details: Decreased step length bil with partial step through to full step through gait pattern. Reliant on AD for balance. Occasional assist navigating AD.   Stairs Stairs: Yes Stairs assistance: Min assist Stair Management: Two rails, Alternating pattern, Step to pattern, Forwards Number of Stairs: 2 General stair comments: pt performed alternating gait pattern ascending the stairs and step to gait pattern descending the stairs.     Balance Overall balance assessment: History of Falls, Needs assistance Sitting-balance support: Feet supported, Bilateral upper extremity supported Sitting balance-Leahy Scale: Good     Standing balance support: Reliant on assistive device for balance, Bilateral upper extremity supported Standing balance-Leahy Scale: Poor Standing balance comment: RW          Communication Communication Communication: Impaired Factors Affecting Communication: Hearing impaired  Cognition Arousal: Alert Behavior During Therapy: Flat affect   PT - Cognitive impairments: No family/caregiver present to determine baseline, Memory, Initiation, Sequencing, Problem solving, Safety/Judgement, Attention, Awareness     Following commands: Impaired Following commands impaired: Follows one step commands with increased time    Cueing Cueing Techniques: Verbal cues     General Comments General comments (skin integrity, edema, etc.): no signs/symptoms of cardiac/respiratory distress  during  session. Spouse present at end of session      Pertinent Vitals/Pain Pain Assessment Pain Assessment: No/denies pain    Home Living Family/patient expects to be discharged to:: Private residence Living Arrangements: Spouse/significant other Available Help at Discharge: Family Type of Home: House Home Access: Level entry       Home Layout: Two level;1/2 bath on main level;Able to live on main level with bedroom/bathroom Home Equipment: Rolling Walker (2 wheels);BSC/3in1;Wheelchair - manual          PT Goals (current goals can now be found in the care plan section) Acute Rehab PT Goals Patient Stated Goal: none specified PT Goal Formulation: Patient unable to participate in goal setting Time For Goal Achievement: 10/15/23 Potential to Achieve Goals: Fair Progress towards PT goals: Progressing toward goals    Frequency    Min 2X/week      PT Plan  Continue with current POC        AM-PAC PT "6 Clicks" Mobility   Outcome Measure  Help needed turning from your back to your side while in a flat bed without using bedrails?: A Little Help needed moving from lying on your back to sitting on the side of a flat bed without using bedrails?: A Little Help needed moving to and from a bed to a chair (including a wheelchair)?: A Little Help needed standing up from a chair using your arms (e.g., wheelchair or bedside chair)?: A Little Help needed to walk in hospital room?: A Little Help needed climbing 3-5 steps with a railing? : A Lot 6 Click Score: 17    End of Session Equipment Utilized During Treatment: Gait belt Activity Tolerance: Patient tolerated treatment well Patient left: in bed;with call bell/phone within reach;with bed alarm set Nurse Communication: Mobility status PT Visit Diagnosis: Unsteadiness on feet (R26.81);Muscle weakness (generalized) (M62.81);Other abnormalities of gait and mobility (R26.89);History of falling (Z91.81);Difficulty in walking, not  elsewhere classified (R26.2)     Time: 8469-6295 PT Time Calculation (min) (ACUTE ONLY): 16 min  Charges:    $Therapeutic Activity: 8-22 mins PT General Charges $$ ACUTE PT VISIT: 1 Visit                     Sloan Duncans, DPT, CLT  Acute Rehabilitation Services Office: [phone] (Secure chat preferred)    Jenice Mitts 10/02/2023, 12:15 PM

## 2023-10-03 ENCOUNTER — Other Ambulatory Visit: Payer: Self-pay

## 2023-10-03 ENCOUNTER — Inpatient Hospital Stay (HOSPITAL_COMMUNITY): Admitting: Certified Registered"

## 2023-10-03 ENCOUNTER — Encounter (HOSPITAL_COMMUNITY): Payer: Self-pay | Admitting: Internal Medicine

## 2023-10-03 ENCOUNTER — Encounter (HOSPITAL_COMMUNITY)
Admission: EM | Disposition: A | Discharge status: Rehabilitation Facility | Payer: Self-pay | Source: Home / Self Care | Attending: Pulmonary Disease

## 2023-10-03 DIAGNOSIS — I4891 Unspecified atrial fibrillation: Secondary | ICD-10-CM

## 2023-10-03 DIAGNOSIS — D320 Benign neoplasm of cerebral meninges: Secondary | ICD-10-CM

## 2023-10-03 DIAGNOSIS — G9389 Other specified disorders of brain: Secondary | ICD-10-CM | POA: Diagnosis not present

## 2023-10-03 DIAGNOSIS — E039 Hypothyroidism, unspecified: Secondary | ICD-10-CM

## 2023-10-03 DIAGNOSIS — I629 Nontraumatic intracranial hemorrhage, unspecified: Secondary | ICD-10-CM | POA: Diagnosis not present

## 2023-10-03 DIAGNOSIS — I6200 Nontraumatic subdural hemorrhage, unspecified: Secondary | ICD-10-CM

## 2023-10-03 HISTORY — PX: CRANIOTOMY: SHX93

## 2023-10-03 LAB — MRSA NEXT GEN BY PCR, NASAL: MRSA by PCR Next Gen: NOT DETECTED

## 2023-10-03 LAB — PREPARE RBC (CROSSMATCH)

## 2023-10-03 SURGERY — CRANIOTOMY TUMOR EXCISION
Anesthesia: General | Laterality: Left

## 2023-10-03 MED ORDER — FENTANYL CITRATE (PF) 100 MCG/2ML IJ SOLN: 25.0000 ug | INTRAMUSCULAR | Status: DC | PRN

## 2023-10-03 MED ORDER — EPHEDRINE 5 MG/ML INJ
INTRAVENOUS | Status: AC
  Filled 2023-10-03: qty 5

## 2023-10-03 MED ORDER — BUPIVACAINE HCL (PF) 0.5 % IJ SOLN
INTRAMUSCULAR | Status: AC
  Filled 2023-10-03: qty 30

## 2023-10-03 MED ORDER — ORAL CARE MOUTH RINSE: 15.0000 mL | Freq: Once | OROMUCOSAL | Status: AC

## 2023-10-03 MED ORDER — OXIDIZED CELLULOSE EX PADS
MEDICATED_PAD | CUTANEOUS | Status: DC | PRN
  Administered 2023-10-03: 1 via TOPICAL

## 2023-10-03 MED ORDER — THROMBIN 20000 UNITS EX SOLR
CUTANEOUS | Status: AC
  Filled 2023-10-03: qty 20000

## 2023-10-03 MED ORDER — GLYCOPYRROLATE PF 0.2 MG/ML IJ SOSY
PREFILLED_SYRINGE | INTRAMUSCULAR | Status: AC
  Filled 2023-10-03: qty 1

## 2023-10-03 MED ORDER — FENTANYL CITRATE (PF) 250 MCG/5ML IJ SOLN
INTRAMUSCULAR | Status: DC | PRN
  Administered 2023-10-03: 100 ug via INTRAVENOUS
  Administered 2023-10-03: 50 ug via INTRAVENOUS

## 2023-10-03 MED ORDER — 0.9 % SODIUM CHLORIDE (POUR BTL) OPTIME
TOPICAL | Status: DC | PRN
  Administered 2023-10-03: 3000 mL

## 2023-10-03 MED ORDER — SODIUM CHLORIDE 0.9 % IV SOLN
INTRAVENOUS | Status: DC | PRN
  Administered 2023-10-03: 500 mg via INTRAVENOUS

## 2023-10-03 MED ORDER — PHENYLEPHRINE HCL-NACL 20-0.9 MG/250ML-% IV SOLN
INTRAVENOUS | Status: DC | PRN
Start: 2023-10-03 — End: 2023-10-03
  Administered 2023-10-03: 35 ug/min via INTRAVENOUS

## 2023-10-03 MED ORDER — ACETAMINOPHEN 10 MG/ML IV SOLN
INTRAVENOUS | Status: DC | PRN
  Administered 2023-10-03: 1000 mg via INTRAVENOUS

## 2023-10-03 MED ORDER — PROPOFOL 500 MG/50ML IV EMUL
INTRAVENOUS | Status: DC | PRN
  Administered 2023-10-03: 95 ug/kg/min via INTRAVENOUS

## 2023-10-03 MED ORDER — SODIUM CHLORIDE 0.9 % IV SOLN
0.1500 ug/kg/min | INTRAVENOUS | Status: DC
  Administered 2023-10-03: .1 ug/kg/min via INTRAVENOUS
  Filled 2023-10-03 (×2): qty 2000

## 2023-10-03 MED ORDER — CHLORHEXIDINE GLUCONATE 0.12 % MT SOLN: 15.0000 mL | Freq: Once | OROMUCOSAL | Status: AC

## 2023-10-03 MED ORDER — ONDANSETRON HCL 4 MG/2ML IJ SOLN
INTRAMUSCULAR | Status: AC
Start: 2023-10-03 — End: ?
  Filled 2023-10-03: qty 2

## 2023-10-03 MED ORDER — ONDANSETRON HCL 4 MG/2ML IJ SOLN
INTRAMUSCULAR | Status: AC
  Filled 2023-10-03: qty 2

## 2023-10-03 MED ORDER — DEXAMETHASONE SODIUM PHOSPHATE 10 MG/ML IJ SOLN
INTRAMUSCULAR | Status: DC | PRN
  Administered 2023-10-03: 10 mg via INTRAVENOUS

## 2023-10-03 MED ORDER — THROMBIN 20000 UNITS EX SOLR
CUTANEOUS | Status: DC | PRN
  Administered 2023-10-03: 20 mL via TOPICAL

## 2023-10-03 MED ORDER — PROPOFOL 10 MG/ML IV BOLUS
INTRAVENOUS | Status: DC | PRN
Start: 2023-10-03 — End: 2023-10-03
  Administered 2023-10-03: 100 mg via INTRAVENOUS
  Administered 2023-10-03: 50 mg via INTRAVENOUS

## 2023-10-03 MED ORDER — ACETAMINOPHEN 10 MG/ML IV SOLN
INTRAVENOUS | Status: AC
  Filled 2023-10-03: qty 100

## 2023-10-03 MED ORDER — SODIUM CHLORIDE 0.9% IV SOLUTION
Freq: Once | INTRAVENOUS | Status: DC
Start: 2023-10-03 — End: 2023-10-10

## 2023-10-03 MED ORDER — THROMBIN 5000 UNITS EX KIT
PACK | CUTANEOUS | Status: AC
  Filled 2023-10-03: qty 1

## 2023-10-03 MED ORDER — HYDROMORPHONE HCL 1 MG/ML IJ SOLN
0.5000 mg | INTRAMUSCULAR | Status: DC | PRN
  Administered 2023-10-04 – 2023-10-05 (×2): 0.5 mg via INTRAVENOUS
  Filled 2023-10-03 (×2): qty 1

## 2023-10-03 MED ORDER — CHLORHEXIDINE GLUCONATE 0.12 % MT SOLN
OROMUCOSAL | Status: AC
  Administered 2023-10-03: 15 mL via OROMUCOSAL
  Filled 2023-10-03: qty 15

## 2023-10-03 MED ORDER — THROMBIN 5000 UNITS EX SOLR
OROMUCOSAL | Status: DC | PRN
  Administered 2023-10-03 (×2): 5 mL via TOPICAL

## 2023-10-03 MED ORDER — BUPIVACAINE HCL (PF) 0.5 % IJ SOLN
INTRAMUSCULAR | Status: DC | PRN
  Administered 2023-10-03: 10 mL

## 2023-10-03 MED ORDER — ROCURONIUM BROMIDE 10 MG/ML (PF) SYRINGE
PREFILLED_SYRINGE | INTRAVENOUS | Status: AC
  Filled 2023-10-03: qty 10

## 2023-10-03 MED ORDER — DEXAMETHASONE SODIUM PHOSPHATE 4 MG/ML IJ SOLN
2.0000 mg | Freq: Two times a day (BID) | INTRAMUSCULAR | Status: DC
  Administered 2023-10-03 – 2023-10-10 (×14): 2 mg via INTRAVENOUS
  Filled 2023-10-03 (×15): qty 1

## 2023-10-03 MED ORDER — LIDOCAINE-EPINEPHRINE 1 %-1:100000 IJ SOLN
INTRAMUSCULAR | Status: AC
  Filled 2023-10-03: qty 1

## 2023-10-03 MED ORDER — GLYCOPYRROLATE PF 0.2 MG/ML IJ SOSY
PREFILLED_SYRINGE | INTRAMUSCULAR | Status: DC | PRN
Start: 2023-10-03 — End: 2023-10-03
  Administered 2023-10-03 (×2): .2 mg via INTRAVENOUS

## 2023-10-03 MED ORDER — CEFAZOLIN SODIUM-DEXTROSE 2-4 GM/100ML-% IV SOLN
2.0000 g | Freq: Once | INTRAVENOUS | Status: AC
  Administered 2023-10-03: 2 g via INTRAVENOUS

## 2023-10-03 MED ORDER — ONDANSETRON HCL 4 MG/2ML IJ SOLN
INTRAMUSCULAR | Status: DC | PRN
  Administered 2023-10-03: 4 mg via INTRAVENOUS

## 2023-10-03 MED ORDER — FENTANYL CITRATE (PF) 250 MCG/5ML IJ SOLN
INTRAMUSCULAR | Status: AC
  Filled 2023-10-03: qty 5

## 2023-10-03 MED ORDER — PROPOFOL 10 MG/ML IV BOLUS
INTRAVENOUS | Status: AC
  Filled 2023-10-03: qty 20

## 2023-10-03 MED ORDER — BACITRACIN ZINC 500 UNIT/GM EX OINT
TOPICAL_OINTMENT | CUTANEOUS | Status: AC
  Filled 2023-10-03: qty 28.35

## 2023-10-03 MED ORDER — SUGAMMADEX SODIUM 200 MG/2ML IV SOLN
INTRAVENOUS | Status: DC | PRN
  Administered 2023-10-03: 200 mg via INTRAVENOUS

## 2023-10-03 MED ORDER — LIDOCAINE-EPINEPHRINE 1 %-1:100000 IJ SOLN
INTRAMUSCULAR | Status: DC | PRN
  Administered 2023-10-03: 10 mL

## 2023-10-03 MED ORDER — CEFAZOLIN SODIUM-DEXTROSE 2-4 GM/100ML-% IV SOLN
2.0000 g | Freq: Three times a day (TID) | INTRAVENOUS | Status: AC
  Administered 2023-10-03 – 2023-10-10 (×21): 2 g via INTRAVENOUS
  Filled 2023-10-03 (×20): qty 100

## 2023-10-03 MED ORDER — ONDANSETRON HCL 4 MG/2ML IJ SOLN: 4.0000 mg | Freq: Once | INTRAMUSCULAR | Status: DC | PRN

## 2023-10-03 MED ORDER — SODIUM CHLORIDE 0.9 % IV SOLN: INTRAVENOUS | Status: DC

## 2023-10-03 MED ORDER — DEXAMETHASONE SODIUM PHOSPHATE 10 MG/ML IJ SOLN
INTRAMUSCULAR | Status: AC
  Filled 2023-10-03: qty 1

## 2023-10-03 MED ORDER — LEVETIRACETAM (KEPPRA) 500 MG/5 ML ADULT IV PUSH
500.0000 mg | Freq: Two times a day (BID) | INTRAVENOUS | Status: DC
  Administered 2023-10-03 – 2023-10-05 (×6): 500 mg via INTRAVENOUS
  Filled 2023-10-03 (×7): qty 5

## 2023-10-03 MED ORDER — LACTATED RINGERS IV SOLN: INTRAVENOUS | Status: DC | PRN

## 2023-10-03 MED ORDER — BACITRACIN ZINC 500 UNIT/GM EX OINT
TOPICAL_OINTMENT | CUTANEOUS | Status: DC | PRN
Start: 2023-10-03 — End: 2023-10-03
  Administered 2023-10-03: 1 via TOPICAL

## 2023-10-03 MED ORDER — THROMBIN (RECOMBINANT) 5000 UNITS EX SOLR
CUTANEOUS | Status: AC
  Filled 2023-10-03: qty 5000

## 2023-10-03 MED ORDER — PANTOPRAZOLE SODIUM 40 MG IV SOLR
40.0000 mg | Freq: Every day | INTRAVENOUS | Status: DC
  Administered 2023-10-03 – 2023-10-06 (×4): 40 mg via INTRAVENOUS
  Filled 2023-10-03 (×4): qty 10

## 2023-10-03 MED ORDER — LABETALOL HCL 5 MG/ML IV SOLN
10.0000 mg | INTRAVENOUS | Status: DC | PRN
  Administered 2023-10-03: 10 mg via INTRAVENOUS
  Administered 2023-10-03 (×2): 20 mg via INTRAVENOUS
  Administered 2023-10-04 (×4): 40 mg via INTRAVENOUS
  Administered 2023-10-04: 30 mg via INTRAVENOUS
  Filled 2023-10-03 (×2): qty 8
  Filled 2023-10-03: qty 4
  Filled 2023-10-03: qty 8
  Filled 2023-10-03 (×2): qty 4
  Filled 2023-10-03 (×3): qty 8

## 2023-10-03 MED ORDER — ROCURONIUM BROMIDE 10 MG/ML (PF) SYRINGE
PREFILLED_SYRINGE | INTRAVENOUS | Status: DC | PRN
  Administered 2023-10-03: 10 mg via INTRAVENOUS
  Administered 2023-10-03 (×2): 20 mg via INTRAVENOUS
  Administered 2023-10-03: 60 mg via INTRAVENOUS
  Administered 2023-10-03: 30 mg via INTRAVENOUS

## 2023-10-03 MED ORDER — CEFAZOLIN SODIUM-DEXTROSE 2-4 GM/100ML-% IV SOLN
INTRAVENOUS | Status: AC
  Filled 2023-10-03: qty 100

## 2023-10-03 SURGICAL SUPPLY — 62 items
BAG COUNTER SPONGE SURGICOUNT (BAG) ×1 IMPLANT
BATTERY IQ STERILE (MISCELLANEOUS) IMPLANT
BIT DRILL WIRE PASS 1.3MM (BIT) IMPLANT
BLADE CLIPPER SURG (BLADE) ×3 IMPLANT
BNDG GAUZE DERMACEA FLUFF 4 (GAUZE/BANDAGES/DRESSINGS) IMPLANT
BNDG STRETCH 4X75 STRL LF (GAUZE/BANDAGES/DRESSINGS) IMPLANT
BUR ACORN 6.0 PRECISION (BURR) ×1 IMPLANT
BUR SPIRAL ROUTER 2.3 (BUR) ×1 IMPLANT
CANISTER SUCTION 3000ML PPV (SUCTIONS) ×1 IMPLANT
CLIP TI MEDIUM 6 (CLIP) IMPLANT
CNTNR URN SCR LID CUP LEK RST (MISCELLANEOUS) ×1 IMPLANT
DRAIN SUBARACHNOID (WOUND CARE) IMPLANT
DRAPE MICROSCOPE SLANT 54X150 (MISCELLANEOUS) IMPLANT
DRAPE SURG IRRIG POUCH 19X23 (DRAPES) IMPLANT
DRAPE WARM FLUID 44X44 (DRAPES) ×1 IMPLANT
DRSG TELFA 3X8 NADH STRL (GAUZE/BANDAGES/DRESSINGS) IMPLANT
DURAPREP 6ML APPLICATOR 50/CS (WOUND CARE) ×1 IMPLANT
ELECTRODE REM PT RTRN 9FT ADLT (ELECTROSURGICAL) ×1 IMPLANT
FORCEPS BIPOLAR SPETZLER 8 1.0 (NEUROSURGERY SUPPLIES) IMPLANT
GAUZE 4X4 16PLY ~~LOC~~+RFID DBL (SPONGE) IMPLANT
GAUZE SPONGE 4X4 12PLY STRL (GAUZE/BANDAGES/DRESSINGS) IMPLANT
GLOVE BIOGEL PI IND STRL 8.5 (GLOVE) ×1 IMPLANT
GLOVE ECLIPSE 8.5 STRL (GLOVE) ×1 IMPLANT
GLOVE EXAM NITRILE XL STR (GLOVE) IMPLANT
GOWN STRL REUS W/ TWL LRG LVL3 (GOWN DISPOSABLE) IMPLANT
GOWN STRL REUS W/ TWL XL LVL3 (GOWN DISPOSABLE) ×1 IMPLANT
GOWN STRL REUS W/TWL 2XL LVL3 (GOWN DISPOSABLE) ×1 IMPLANT
HEMOSTAT POWDER KIT SURGIFOAM (HEMOSTASIS) IMPLANT
HEMOSTAT SURGICEL 2X14 (HEMOSTASIS) IMPLANT
KIT BASIN OR (CUSTOM PROCEDURE TRAY) ×1 IMPLANT
KIT TURNOVER KIT B (KITS) ×1 IMPLANT
NDL HYPO 22X1.5 SAFETY MO (MISCELLANEOUS) ×1 IMPLANT
NEEDLE HYPO 22X1.5 SAFETY MO (MISCELLANEOUS) ×1 IMPLANT
NS IRRIG 1000ML POUR BTL (IV SOLUTION) ×1 IMPLANT
PACK BATTERY CMF DISP FOR DVR (ORTHOPEDIC DISPOSABLE SUPPLIES) IMPLANT
PACK CRANIOTOMY CUSTOM (CUSTOM PROCEDURE TRAY) ×1 IMPLANT
PAD ARMBOARD POSITIONER FOAM (MISCELLANEOUS) ×3 IMPLANT
PATTIES SURGICAL .25X.25 (GAUZE/BANDAGES/DRESSINGS) IMPLANT
PATTIES SURGICAL .5 X.5 (GAUZE/BANDAGES/DRESSINGS) ×1 IMPLANT
PATTIES SURGICAL .5 X1 (DISPOSABLE) IMPLANT
PATTIES SURGICAL .5 X3 (DISPOSABLE) IMPLANT
PATTIES SURGICAL 1X1 (DISPOSABLE) ×1 IMPLANT
PATTIES SURGICAL 3X3 (GAUZE/BANDAGES/DRESSINGS) IMPLANT
PIN MAYFIELD SKULL DISP (PIN) IMPLANT
PLATE BONE 12 2H TARGET XL (Plate) IMPLANT
SCREW UNIII AXS SD 1.5X4 (Screw) IMPLANT
SPIKE FLUID TRANSFER (MISCELLANEOUS) ×1 IMPLANT
SPONGE NEURO XRAY DETECT 1X3 (DISPOSABLE) IMPLANT
SPONGE SURGIFOAM ABS GEL 100 (HEMOSTASIS) ×1 IMPLANT
STAPLER VISISTAT (STAPLE) ×1 IMPLANT
SUT 3-0 BLK 1X30 PSL (SUTURE) IMPLANT
SUT NURALON 4 0 TR CR/8 (SUTURE) ×2 IMPLANT
SUT VIC AB 2-0 CP2 18 (SUTURE) ×2 IMPLANT
SUT VIC AB 4-0 RB1 18 (SUTURE) ×1 IMPLANT
SYR 30ML SLIP (SYRINGE) ×1 IMPLANT
TOWEL GREEN STERILE (TOWEL DISPOSABLE) ×1 IMPLANT
TOWEL GREEN STERILE FF (TOWEL DISPOSABLE) ×1 IMPLANT
TRAP SPECIMEN MUCUS 40CC (MISCELLANEOUS) IMPLANT
TRAY FOLEY MTR SLVR 16FR STAT (SET/KITS/TRAYS/PACK) IMPLANT
TUBING FEATHERFLOW (TUBING) ×1 IMPLANT
UNDERPAD 30X36 HEAVY ABSORB (UNDERPADS AND DIAPERS) IMPLANT
WATER STERILE IRR 1000ML POUR (IV SOLUTION) ×1 IMPLANT

## 2023-10-03 NOTE — Anesthesia Procedure Notes (Signed)
 Arterial Line Insertion Start/End5/12/2023 7:40 AM, 10/03/2023 7:50 AM Performed by: Erin Havers, MD, Colen Daunt, CRNA, CRNA  Patient location: Pre-op. Preanesthetic checklist: patient identified, IV checked, site marked, risks and benefits discussed, surgical consent, monitors and equipment checked, pre-op evaluation, timeout performed and anesthesia consent Lidocaine 1% used for infiltration Right, radial was placed Catheter size: 20 G Hand hygiene performed , maximum sterile barriers used  and Seldinger technique used  Attempts: 2 Procedure performed using ultrasound guided technique. Following insertion, dressing applied. Post procedure assessment: normal and unchanged  Patient tolerated the procedure well with no immediate complications.

## 2023-10-03 NOTE — Progress Notes (Signed)
 Patient ID: Jesse [name], male   DOB: [birth year], 83 y.o.   MRN: [id number] But signs are stable Patient is awake and alert. Speech appears intact Motor function on right side is intact also Dressing is clean dry

## 2023-10-03 NOTE — Anesthesia Preprocedure Evaluation (Addendum)
 Anesthesia Evaluation  Patient identified by MRN, date of birth, ID band Patient awake    Reviewed: Allergy & Precautions, NPO status , Patient's Chart, lab work & pertinent test results  Airway Mallampati: III  TM Distance: >3 FB Neck ROM: Full    Dental  (+) Dental Advisory Given, Missing   Pulmonary former smoker   Pulmonary exam normal breath sounds clear to auscultation       Cardiovascular Normal cardiovascular exam+ dysrhythmias Atrial Fibrillation  Rhythm:Regular Rate:Normal     Neuro/Psych  PSYCHIATRIC DISORDERS Anxiety Depression   Dementia Meningioma, cerebral Bilateral subdural hematomas    GI/Hepatic negative GI ROS, Neg liver ROS,,,  Endo/Other  Hypothyroidism    Renal/GU negative Renal ROS   H/o prostate cancer     Musculoskeletal  (+) Arthritis ,    Abdominal   Peds  Hematology negative hematology ROS (+)   Anesthesia Other Findings Day of surgery medications reviewed with the patient.  Reproductive/Obstetrics                              Anesthesia Physical Anesthesia Plan  ASA: 3  Anesthesia Plan: General   Post-op Pain Management: Ofirmev  IV (intra-op)*   Induction: Intravenous  PONV Risk Score and Plan: 2 and TIVA, Dexamethasone  and Ondansetron   Airway Management Planned: Oral ETT  Additional Equipment: Arterial line  Intra-op Plan:   Post-operative Plan: Extubation in OR  Informed Consent: I have reviewed the patients History and Physical, chart, labs and discussed the procedure including the risks, benefits and alternatives for the proposed anesthesia with the patient or authorized representative who has indicated his/her understanding and acceptance.     Dental advisory given  Plan Discussed with: CRNA  Anesthesia Plan Comments: (2nd PIV after induction vs CVL if inadequate PIV access)       Anesthesia Quick Evaluation

## 2023-10-03 NOTE — Consult Note (Signed)
 NAME:  Jesse [name], MRN:  [id number], DOB:  1940-05-30, LOS: 3 ADMISSION DATE:  09/30/2023, CONSULTATION DATE:  10/03/23 REFERRING MD:  NSGY, CHIEF COMPLAINT:  post op   History of Present Illness:   83 year old male with PMH as below, significant for of PAF not on AC, dementia, HOH, RLS, hypothyroidism, and prior known left frontal meningioma.  Was diagnosed initially in 2016 but refused prior surgical/ radiation interventions.  Presented 5/5 after unwitnessed fall, found lying on his back over his walker with left facial swelling and dislodgement of three teeth.  He was confused and perseverating on numbers.  Workup found to have enlarging left frontal meningioma now exerting mass effect with bilateral subdural hematomas with  rightward MLS.  Admitted to [facility] with NSGY and PMT consulting.  Remains full code and family agreed to surgical intervention.  Underwent craniotomy and resection on 5/8 by Dr. Ellery Guthrie with evacuation of SDH.  Pt extubated in PACU and transferred to Neuro ICU for post operative care.  PCCM consulted for medical management in ICU.   Pertinent  Medical History  Known meningioma, PAF not on AC, dementia, depression, hearing impairment, prostate ca s/p TURP/ radiation, RLS, hypothyroidism  Significant Hospital Events: Including procedures, antibiotic start and stop dates in addition to other pertinent events   5/5 admitted  5/8 craniotomy and tumor resection/ SDH evacuation  Interim History / Subjective:  C/o of pain all over, denies nausea  Objective    Blood pressure 123/76, pulse 61, temperature (!) 97.5 F (36.4 C), resp. rate 10, height 5\' 11"  (1.803 m), weight 74.7 kg, SpO2 99%.        Intake/Output Summary (Last 24 hours) at 10/03/2023 1322 Last data filed at 10/03/2023 1143 Gross per 24 hour  Intake 2640 ml  Output 1350 ml  Net 1290 ml   Filed Weights   09/30/23 2000  Weight: 74.7 kg    Examination: General:  Elderly male lying upright in bed in  NAD HEENT: MM pink/dry, pupils 3/r, anicteric, large bulky crani dressing, slight shadowing on posterior, slight left facial droop and left jaw/facial ecchymosis  Neuro: lethargic, awakens to verbal/ light touch, slowed but oriented x 3, MAE- no obvious focal deficits CV: rr, NSR, no murmur, R radial aline PULM:  non labored, shallow, clear anteriorly, faint bibasilar rales L>R GI: soft, bs hypo, NT, foley- cyu Extremities: warm/dry, no tibial edema, small left knee abrasion Skin: no rashes    Resolved Hospital Problem list    Assessment & Plan:   Left frontal meningioma with mass effect with rightward MLS, bilateral SDH s/p left frontal craniotomy with tumor resection and SDH evacuation Unwitnessed fall Encephalopathy/ hx of dementia - EEG 5/5 neg for seizures  P:  - post op per NSGY - SBP goal < 160, prn labetalol - decadron  and empiric keppra per NSGY - serial neuro exams, neuro protective measures  - lethargic, slowed responses but oriented  - follow surgical pathology - protecting airway, cont aspiration precautions - NPO for now.  Swallow eval prior to orals - complete post op abx - further imaging per NSGY, stat CTH if any decompensation - SCDs - avoid/ minimize sedating meds, tylenol  prn pain - PT/ OT- likely will need CIR - continue foley for now, likely d/c tonight vs am - PMT following, remains full code   Hyponatremia - stable, trend on labs  Hypokalemia - replete prn, trend on labs  PAF - following cervical laminectomy in 2013, treated short term  with amiodarone and AC P:  - remains NSR, monitor on tele  Hypothyroidism - cont synthroid    Best Practice (right click and "Reselect all SmartList Selections" daily)   Diet/type: NPO DVT prophylaxis SCD Pressure ulcer(s): N/A GI prophylaxis: PPI Lines: Arterial Line Foley:  Yes, and it is still needed Code Status:  full code Last date of multidisciplinary goals of care discussion [ongoing, PMT  following]  No family at bedside at present  Labs   CBC: Recent Labs  Lab 09/30/23 0135 10/01/23 0550  WBC 7.4 7.6  HGB 13.7 14.5  HCT 40.3 41.3  MCV 87.4 84.8  PLT 206 222    Basic Metabolic Panel: Recent Labs  Lab 09/30/23 0135 09/30/23 1711 10/01/23 0550  NA 128* 135 132*  K 4.1 3.3* 4.4  CL 95* 108 105  CO2 21* 19* 21*  GLUCOSE 103* 93 134*  BUN 17 8 12   CREATININE 0.93 0.63 0.80  CALCIUM 9.0 6.9* 9.1   GFR: Estimated Creatinine Clearance: 73.9 mL/min (by C-G formula based on SCr of 0.8 mg/dL). Recent Labs  Lab 09/30/23 0135 10/01/23 0550  WBC 7.4 7.6    Liver Function Tests: Recent Labs  Lab 09/30/23 0135  AST 32  ALT 18  ALKPHOS 67  BILITOT 0.8  PROT 6.5  ALBUMIN 4.0   No results for input(s): "LIPASE", "AMYLASE" in the last 168 hours. No results for input(s): "AMMONIA" in the last 168 hours.  ABG No results found for: "PHART", "PCO2ART", "PO2ART", "HCO3", "TCO2", "ACIDBASEDEF", "O2SAT"   Coagulation Profile: Recent Labs  Lab 09/30/23 0317  INR 1.1    Cardiac Enzymes: No results for input(s): "CKTOTAL", "CKMB", "CKMBINDEX", "TROPONINI" in the last 168 hours.  HbA1C: Hgb A1c MFr Bld  Date/Time Value Ref Range Status  08/26/2020 02:25 PM 5.7 4.6 - 6.5 % Final    Comment:    Glycemic Control Guidelines for People with Diabetes:Non Diabetic:  <6%Goal of Therapy: <7%Additional Action Suggested:  >8%     CBG: Recent Labs  Lab 09/30/23 0146 09/30/23 0157  GLUCAP 109* 109*    Review of Systems:   Limited given lethargy  Past Medical History:  He,  has a past medical history of Arthritis, Atrial fibrillation (HCC), Complication of anesthesia, Dementia (HCC), Depression, Hard of hearing, constipation, Osteoporosis, Prostate cancer (HCC), Restless legs, Thyroid  disease, and Vitiligo.   Surgical History:   Past Surgical History:  Procedure Laterality Date   BACK SURGERY     COLONOSCOPY     FLEXIBLE SIGMOIDOSCOPY N/A 09/27/2017    Procedure: FLEXIBLE SIGMOIDOSCOPY;  Surgeon: Kenney Peacemaker, MD;  Location: [facility] ENDOSCOPY;  Service: Endoscopy;  Laterality: N/A;   JOINT REPLACEMENT     LAMINECTOMY  10/2011   cervical and lumbar laminectomy   PROSTATE BIOPSY  07/10/2011   PROSTATE BIOPSY  09/22/15   TOTAL HIP ARTHROPLASTY Right 02/29/2012   TOTAL HIP ARTHROPLASTY Right 07/04/2016   Procedure: RIGHT TOTAL HIP ARTHROPLASTY ANTERIOR APPROACH;  Surgeon: Adonica Hoose, MD;  Location: [facility] OR;  Service: Orthopedics;  Laterality: Right;   TRANSURETHRAL RESECTION OF PROSTATE       Social History:   reports that he quit smoking about 49 years ago. His smoking use included cigarettes. He has never used smokeless tobacco. He reports current alcohol use. He reports that he does not use drugs.   Family History:  His family history includes Atrial fibrillation in his mother and sister; Cystic fibrosis in his mother. There is no history of Cancer, Esophageal cancer,  Colon cancer, Rectal cancer, or Stomach cancer.   Allergies No Known Allergies   Home Medications  Prior to Admission medications   Medication Sig Start Date End Date Taking? Authorizing Provider  Cyanocobalamin  (VITAMIN B 12 PO) Take 1,000 Units by mouth 3 (three) times a week.    Yes [provider]  SYNTHROID  50 MCG tablet TAKE 1 TABLET DAILY BEFORE BREAKFAST 07/18/21  Yes Rodney Clamp, MD     Critical care time: 38 mins       Early Glisson, MSN, AG-ACNP-BC [facility] 10/03/2023, 1:22 PM  See Amion for pager If no response to pager , please call [phone] until 7pm After 7:00 pm call Elink  [phone]

## 2023-10-03 NOTE — Anesthesia Procedure Notes (Signed)
 Procedure Name: Intubation Date/Time: 10/03/2023 8:10 AM  Performed by: Claud Crumb, CRNAPre-anesthesia Checklist: Patient identified, Emergency Drugs available, Suction available and Patient being monitored Patient Re-evaluated:Patient Re-evaluated prior to induction Oxygen Delivery Method: Circle System Utilized Preoxygenation: Pre-oxygenation with 100% oxygen Induction Type: IV induction Ventilation: Mask ventilation without difficulty Laryngoscope Size: Mac and 4 Grade View: Grade II Tube type: Oral Tube size: 7.5 mm Number of attempts: 1 Airway Equipment and Method: Stylet Placement Confirmation: ETT inserted through vocal cords under direct vision, positive ETCO2 and breath sounds checked- equal and bilateral Secured at: 23 cm Tube secured with: Tape Dental Injury: Teeth and Oropharynx as per pre-operative assessment

## 2023-10-03 NOTE — Interval H&P Note (Signed)
 History and Physical Interval Note:  10/03/2023 7:40 AM  Jesse [name]  has presented today for surgery, with the diagnosis of Menigioma.  The various methods of treatment have been discussed with the patient and family. After consideration of risks, benefits and other options for treatment, the patient has consented to  Procedure(s) with comments: CRANIOTOMY TUMOR EXCISION (Left) - Left Frontal Craniotomy for Menigioma as a surgical intervention.  The patient's history has been reviewed, patient examined, no change in status, stable for surgery.  I have reviewed the patient's chart and labs.  Questions were answered to the patient's satisfaction.     [name]

## 2023-10-03 NOTE — Op Note (Signed)
 Surgery: 10/03/2023 Preoperative diagnosis: Left frontal brain tumor, left frontal subdural hematoma. Postoperative diagnosis: Same Procedure: Craniotomy and resection of brain tumor (meningioma) evacuation of subdural hematoma. Surgeon: Elna Haggis First Assistant: Darryl Endow, PA Anesthesia: General endotracheal Indications: Jesse [name] is a 83 year old individual whose had significant engine mental status here in the last week he had sustained a fall.  He had a thin subdural hematoma but he had a 6 cm mass in the left frontal region that had been being followed for a number of years.  He was advised regarding surgery previously but refused and now that he is had significant mental status changes he is agreeable to proceeding with surgery.  Procedure: Patient was brought to the operating room supine on the stretcher.  After the smooth induction of general tracheal anesthesia and placement of a Foley catheter and arterial line the patient's head was placed in the 3 pin headrest after being shaved.  The left frontal region was exposed.  Patient's head was turned slightly to the right.  Then after marking a standard right frontal flap in a U-shaped fashion the patient's scalp was prepped with alcohol and DuraPrep and draped in a sterile fashion.  After appropriate timeout we infiltrated the skin with 20 cc of lidocaine with epinephrine  mixed 50-50 with half percent Marcaine .  A curvilinear incision was then created in the chosen area and the scalp flap was brought forward.  The superior posterior portion of the temporalis fascia was incised and the temporalis muscle was removed from the bone and brought forward also.  Fishhook retractors were used Raney clips were used on the bleeding edges of the scalp.  Then a singular bur hole was placed near the posterior border of the temporalis fascia.  The dura was identified.  Using a craniotome then a large ovoid bone flap was raised in this region bringing  it towards the midline.  The bone flap was then elevated separating the dura from the underlying bone there was substantial bleeding particularly from the medial aspect of the bone flap where there was hyper vasculature in the dura itself.  This was controlled with some large pieces of Gelfoam which were used to tamponade the bleeding then after the hemostasis was established I opened the dura on the inferior aspect of the craniotomy flap.  There is noted to be a subdural membrane that was imminently present.  The membrane was dissected from the dura and left intact.  Once the dura was flapped and retracted towards the midline it was noted that the abnormality with the membrane extended all the way over the cortical surface.  Membrane was then incised and by incising it we released some chronic subdural fluid and blood there was some more acute blood noted anteriorly by irrigating this away I was able to then dissect through and open the membrane and folded back medially to expose the border of the tumor and the underlying brain.  Then by using this membrane flap I started to dissect along the edge of the tumor.  By entering the tumor I noticed that it was fairly gelatinous and suctioned readily large specimens were obtained through a look a suction trap and the tumor resection continued from within the tumor at cavity itself.  Then gradually I was able to fold down the edges of the tumor and dissect between the peel surface and the tumor interface.  This occurred in a circumferential fashion and ultimately the tumor was peeled away from all of the  dura.  In the medial aspect the membrane was separated from the attachments to the sagittal sinus.  The tumor itself was not involved with the sagittal sinus.  There was a thin rim of cortex medially that had remained and this was carefully dissected from the tumor edge.  As the tumor was collapsed on itself from the internal decompression the walls were gradually  resected there was however a rather thick syncytial portion of the tumor wall that was attached to the cortical surface deep within the wound.  This was carefully dissected but a portion of the wall was cauterized from the inside out and allowed to remain attached as there was some substantial veins on the opposite side of this that were clearly providing drainage to the cortical surface.  Once this was accomplished the area was carefully inspected this procedure and the dissection was done with the help of Darryl Endow, [place] who provided retraction irrigation and also help with the closure and replacement of the craniotomy flap.  Once all the tumor dissection was completed and the area was carefully inspected under the operating microscope we removed the operating microscope and then proceeded with closure the dura itself was closed primarily with 4-0 Vicryl a series of tack up sutures were placed around the perimetry of the craniotomy flap the medial aspect which had significant bleeding was controlled with a thin layer of Gelfoam strips overlying it.  The bone flap was then replaced with 6 dog bone plates and a singular central tack up was used to secure the dura.  The scalp flap was reflected into its original position several 2-0 Vicryl were used to close the temporalis fascia and then the galea was closed with 2-0 Vicryl around the perimetry and surgical staples were used in the scalp.  The most anterior portion of the scalp was closed with 4-0 nylon as it extended beyond the hairline.  A dry sterile dressing was then applied with a wrap to the patient's head and he was removed from the 3 pin headrest.  He was returned to the recovery room in stable condition.  Blood loss was estimated 550 cc.

## 2023-10-03 NOTE — Anesthesia Postprocedure Evaluation (Signed)
 Anesthesia Post Note  Patient: Jesse [name]  Procedure(s) Performed: CRANIOTOMY TUMOR EXCISION LEFT FRONTAL (Left)     Patient location during evaluation: PACU Anesthesia Type: General Level of consciousness: awake and alert Pain management: pain level controlled Vital Signs Assessment: post-procedure vital signs reviewed and stable Respiratory status: spontaneous breathing, nonlabored ventilation, respiratory function stable and patient connected to nasal cannula oxygen Cardiovascular status: blood pressure returned to baseline and stable Postop Assessment: no apparent nausea or vomiting Anesthetic complications: no   No notable events documented.  Last Vitals:  Vitals:   10/03/23 1300 10/03/23 1400  BP: 132/80 133/81  Pulse: (!) 59 65  Resp: 11 15  Temp:    SpO2: 93% 98%    Last Pain:  Vitals:   10/03/23 1200  TempSrc:   PainSc: Asleep                 Erin Havers

## 2023-10-03 NOTE — Transfer of Care (Signed)
 Immediate Anesthesia Transfer of Care Note  Patient: Jesse [name]  Procedure(s) Performed: CRANIOTOMY TUMOR EXCISION LEFT FRONTAL (Left)  Patient Location: PACU  Anesthesia Type:General  Level of Consciousness: patient cooperative, lethargic, and responds to stimulation  Airway & Oxygen Therapy: Patient Spontanous Breathing and Patient connected to face mask oxygen  Post-op Assessment: Report given to RN and Post -op Vital signs reviewed and stable  Post vital signs: Reviewed and stable  Last Vitals:  Vitals Value Taken Time  BP 109/79 10/03/23 1200  Temp 36.4 C 10/03/23 1200  Pulse 69 10/03/23 1201  Resp 12 10/03/23 1201  SpO2 100 % 10/03/23 1201  Vitals shown include unfiled device data.  Last Pain:  Vitals:   10/03/23 0427  TempSrc: Oral  PainSc: 0-No pain         Complications: No notable events documented.

## 2023-10-04 ENCOUNTER — Inpatient Hospital Stay (HOSPITAL_COMMUNITY): Admitting: Anesthesiology

## 2023-10-04 ENCOUNTER — Inpatient Hospital Stay (HOSPITAL_COMMUNITY)

## 2023-10-04 ENCOUNTER — Encounter (HOSPITAL_COMMUNITY)
Admission: EM | Disposition: A | Discharge status: Rehabilitation Facility | Payer: Self-pay | Source: Home / Self Care | Attending: Internal Medicine

## 2023-10-04 ENCOUNTER — Encounter (HOSPITAL_COMMUNITY): Payer: Self-pay | Admitting: Neurological Surgery

## 2023-10-04 DIAGNOSIS — E039 Hypothyroidism, unspecified: Secondary | ICD-10-CM | POA: Diagnosis not present

## 2023-10-04 DIAGNOSIS — S065X0A Traumatic subdural hemorrhage without loss of consciousness, initial encounter: Secondary | ICD-10-CM

## 2023-10-04 DIAGNOSIS — I4891 Unspecified atrial fibrillation: Secondary | ICD-10-CM

## 2023-10-04 DIAGNOSIS — F418 Other specified anxiety disorders: Secondary | ICD-10-CM

## 2023-10-04 DIAGNOSIS — I629 Nontraumatic intracranial hemorrhage, unspecified: Secondary | ICD-10-CM | POA: Diagnosis not present

## 2023-10-04 DIAGNOSIS — G9389 Other specified disorders of brain: Secondary | ICD-10-CM | POA: Diagnosis not present

## 2023-10-04 HISTORY — PX: CRANIOTOMY: SHX93

## 2023-10-04 LAB — CBC
HCT: 26.1 % — ABNORMAL LOW (ref 39.0–52.0)
HCT: 26.9 % — ABNORMAL LOW (ref 39.0–52.0)
Hemoglobin: 8.9 g/dL — ABNORMAL LOW (ref 13.0–17.0)
Hemoglobin: 9.2 g/dL — ABNORMAL LOW (ref 13.0–17.0)
MCH: 30 pg (ref 26.0–34.0)
MCH: 30 pg (ref 26.0–34.0)
MCHC: 34.1 g/dL (ref 30.0–36.0)
MCHC: 34.2 g/dL (ref 30.0–36.0)
MCV: 87.9 fL (ref 80.0–100.0)
MCV: 87.6 fL (ref 80.0–100.0)
Platelets: 161 10*3/uL (ref 150–400)
Platelets: 168 10*3/uL (ref 150–400)
RBC: 2.97 MIL/uL — ABNORMAL LOW (ref 4.22–5.81)
RBC: 3.07 MIL/uL — ABNORMAL LOW (ref 4.22–5.81)
RDW: 13.2 % (ref 11.5–15.5)
RDW: 13.3 % (ref 11.5–15.5)
WBC: 9.1 10*3/uL (ref 4.0–10.5)
WBC: 9.5 10*3/uL (ref 4.0–10.5)
nRBC: 0 % (ref 0.0–0.2)
nRBC: 0 % (ref 0.0–0.2)

## 2023-10-04 LAB — BASIC METABOLIC PANEL WITH GFR
Anion gap: 11 (ref 5–15)
BUN: 21 mg/dL (ref 8–23)
CO2: 22 mmol/L (ref 22–32)
Calcium: 7.9 mg/dL — ABNORMAL LOW (ref 8.9–10.3)
Chloride: 101 mmol/L (ref 98–111)
Creatinine, Ser: 0.82 mg/dL (ref 0.61–1.24)
GFR, Estimated: >60 mL/min (ref >60)
Glucose, Bld: 139 mg/dL — ABNORMAL HIGH (ref 70–99)
Potassium: 4 mmol/L (ref 3.5–5.1)
Sodium: 134 mmol/L — ABNORMAL LOW (ref 135–145)

## 2023-10-04 LAB — HEMOGLOBIN AND HEMATOCRIT, BLOOD
HCT: 31.2 % — ABNORMAL LOW (ref 39.0–52.0)
Hemoglobin: 10.8 g/dL — ABNORMAL LOW (ref 13.0–17.0)

## 2023-10-04 LAB — PROTIME-INR
INR: 1.2 (ref 0.8–1.2)
INR: 1.2 (ref 0.8–1.2)
Prothrombin Time: 15.7 s — ABNORMAL HIGH (ref 11.4–15.2)
Prothrombin Time: 15.8 s — ABNORMAL HIGH (ref 11.4–15.2)

## 2023-10-04 LAB — FIBRINOGEN: Fibrinogen: 268 mg/dL (ref 210–475)

## 2023-10-04 LAB — PLATELET COUNT: Platelets: 193 10*3/uL (ref 150–400)

## 2023-10-04 LAB — APTT: aPTT: 26 s (ref 24–36)

## 2023-10-04 SURGERY — CRANIOTOMY HEMATOMA EVACUATION SUBDURAL
Anesthesia: General | Site: Head

## 2023-10-04 MED ORDER — GLYCOPYRROLATE PF 0.2 MG/ML IJ SOSY
PREFILLED_SYRINGE | INTRAMUSCULAR | Status: DC | PRN
Start: 2023-10-04 — End: 2023-10-04
  Administered 2023-10-04: .2 mg via INTRAVENOUS

## 2023-10-04 MED ORDER — THROMBIN (RECOMBINANT) 5000 UNITS EX SOLR: CUTANEOUS | Status: DC | PRN

## 2023-10-04 MED ORDER — ONDANSETRON HCL 4 MG/2ML IJ SOLN
INTRAMUSCULAR | Status: DC | PRN
  Administered 2023-10-04: 4 mg via INTRAVENOUS

## 2023-10-04 MED ORDER — PANTOPRAZOLE SODIUM 40 MG IV SOLR: 40.0000 mg | Freq: Every day | INTRAVENOUS | Status: DC

## 2023-10-04 MED ORDER — BACITRACIN ZINC 500 UNIT/GM EX OINT
TOPICAL_OINTMENT | CUTANEOUS | Status: DC | PRN
  Administered 2023-10-04: 1 via TOPICAL

## 2023-10-04 MED ORDER — FLEET ENEMA RE ENEM: 1.0000 | ENEMA | Freq: Once | RECTAL | Status: DC | PRN

## 2023-10-04 MED ORDER — PHENYLEPHRINE HCL-NACL 20-0.9 MG/250ML-% IV SOLN
INTRAVENOUS | Status: DC | PRN
  Administered 2023-10-04: 50 ug/min via INTRAVENOUS

## 2023-10-04 MED ORDER — ALBUMIN HUMAN 5 % IV SOLN: INTRAVENOUS | Status: DC | PRN

## 2023-10-04 MED ORDER — DEXAMETHASONE SODIUM PHOSPHATE 10 MG/ML IJ SOLN
INTRAMUSCULAR | Status: DC | PRN
  Administered 2023-10-04: 10 mg via INTRAVENOUS

## 2023-10-04 MED ORDER — DOCUSATE SODIUM 100 MG PO CAPS
100.0000 mg | ORAL_CAPSULE | Freq: Two times a day (BID) | ORAL | Status: DC
  Administered 2023-10-05 – 2023-10-10 (×11): 100 mg via ORAL
  Filled 2023-10-04 (×11): qty 1

## 2023-10-04 MED ORDER — LEVETIRACETAM (KEPPRA) 500 MG/5 ML ADULT IV PUSH: 500.0000 mg | Freq: Two times a day (BID) | INTRAVENOUS | Status: DC

## 2023-10-04 MED ORDER — ROCURONIUM BROMIDE 10 MG/ML (PF) SYRINGE
PREFILLED_SYRINGE | INTRAVENOUS | Status: DC | PRN
  Administered 2023-10-04: 20 mg via INTRAVENOUS
  Administered 2023-10-04: 10 mg via INTRAVENOUS
  Administered 2023-10-04: 20 mg via INTRAVENOUS
  Administered 2023-10-04: 30 mg via INTRAVENOUS
  Administered 2023-10-04: 20 mg via INTRAVENOUS

## 2023-10-04 MED ORDER — PHENYLEPHRINE 80 MCG/ML (10ML) SYRINGE FOR IV PUSH (FOR BLOOD PRESSURE SUPPORT)
PREFILLED_SYRINGE | INTRAVENOUS | Status: DC | PRN
  Administered 2023-10-04 (×3): 160 ug via INTRAVENOUS

## 2023-10-04 MED ORDER — SUCCINYLCHOLINE CHLORIDE 200 MG/10ML IV SOSY
PREFILLED_SYRINGE | INTRAVENOUS | Status: DC | PRN
  Administered 2023-10-04: 140 mg via INTRAVENOUS

## 2023-10-04 MED ORDER — 0.9 % SODIUM CHLORIDE (POUR BTL) OPTIME
TOPICAL | Status: DC | PRN
  Administered 2023-10-04 (×2): 1000 mL

## 2023-10-04 MED ORDER — SUGAMMADEX SODIUM 200 MG/2ML IV SOLN
INTRAVENOUS | Status: DC | PRN
  Administered 2023-10-04: 200 mg via INTRAVENOUS

## 2023-10-04 MED ORDER — LACTATED RINGERS IV SOLN
INTRAVENOUS | Status: DC | PRN
Start: 2023-10-04 — End: 2023-10-04

## 2023-10-04 MED ORDER — CLEVIDIPINE BUTYRATE 0.5 MG/ML IV EMUL
INTRAVENOUS | Status: DC | PRN
  Administered 2023-10-04: 2 mg/h via INTRAVENOUS

## 2023-10-04 MED ORDER — SENNA 8.6 MG PO TABS
1.0000 | ORAL_TABLET | Freq: Two times a day (BID) | ORAL | Status: DC
  Administered 2023-10-05 – 2023-10-10 (×10): 8.6 mg via ORAL
  Filled 2023-10-04 (×10): qty 1

## 2023-10-04 MED ORDER — BISACODYL 10 MG RE SUPP
10.0000 mg | Freq: Every day | RECTAL | Status: DC | PRN
  Administered 2023-10-06: 10 mg via RECTAL
  Filled 2023-10-04: qty 1

## 2023-10-04 MED ORDER — THROMBIN 5000 UNITS EX SOLR: OROMUCOSAL | Status: DC | PRN

## 2023-10-04 MED ORDER — PROPOFOL 10 MG/ML IV BOLUS
INTRAVENOUS | Status: DC | PRN
  Administered 2023-10-04: 50 mg via INTRAVENOUS

## 2023-10-04 MED ORDER — EPHEDRINE SULFATE-NACL 50-0.9 MG/10ML-% IV SOSY
PREFILLED_SYRINGE | INTRAVENOUS | Status: DC | PRN
  Administered 2023-10-04 (×3): 5 mg via INTRAVENOUS

## 2023-10-04 SURGICAL SUPPLY — 54 items
BAG COUNTER SPONGE SURGICOUNT (BAG) ×1 IMPLANT
BASKET BONE COLLECTION (BASKET) IMPLANT
BATTERY IQ STERILE (MISCELLANEOUS) ×1 IMPLANT
BIT DRILL WIRE PASS 1.3MM (BIT) IMPLANT
BNDG GAUZE DERMACEA FLUFF 4 (GAUZE/BANDAGES/DRESSINGS) ×1 IMPLANT
BUR ACORN 6.0 PRECISION (BURR) ×1 IMPLANT
BUR SPIRAL ROUTER 2.3 (BUR) IMPLANT
CANISTER SUCTION 3000ML PPV (SUCTIONS) ×1 IMPLANT
CLIP TI MEDIUM 6 (CLIP) IMPLANT
DRAIN CHANNEL 10M FLAT 3/4 FLT (DRAIN) IMPLANT
DRAIN PENROSE .5X12 LATEX STL (DRAIN) IMPLANT
DRAPE WARM FLUID 44X44 (DRAPES) ×1 IMPLANT
DRSG ADAPTIC 3X8 NADH LF (GAUZE/BANDAGES/DRESSINGS) IMPLANT
DURAPREP 6ML APPLICATOR 50/CS (WOUND CARE) ×1 IMPLANT
ELECTRODE REM PT RTRN 9FT ADLT (ELECTROSURGICAL) ×1 IMPLANT
EVACUATOR SILICONE 100CC (DRAIN) IMPLANT
FORCEPS BIPOLAR SPETZLER 8 1.0 (NEUROSURGERY SUPPLIES) IMPLANT
GAUZE 4X4 16PLY ~~LOC~~+RFID DBL (SPONGE) IMPLANT
GAUZE PAD ABD 8X10 STRL (GAUZE/BANDAGES/DRESSINGS) IMPLANT
GAUZE SPONGE 4X4 12PLY STRL (GAUZE/BANDAGES/DRESSINGS) IMPLANT
GLOVE BIOGEL PI IND STRL 8.5 (GLOVE) ×1 IMPLANT
GLOVE ECLIPSE 8.5 STRL (GLOVE) ×1 IMPLANT
GLOVE EXAM NITRILE XL STR (GLOVE) IMPLANT
GOWN STRL REUS W/ TWL LRG LVL3 (GOWN DISPOSABLE) IMPLANT
GOWN STRL REUS W/ TWL XL LVL3 (GOWN DISPOSABLE) ×1 IMPLANT
GOWN STRL REUS W/TWL 2XL LVL3 (GOWN DISPOSABLE) ×1 IMPLANT
HEMOSTAT POWDER KIT SURGIFOAM (HEMOSTASIS) ×1 IMPLANT
HEMOSTAT SURGICEL 2X14 (HEMOSTASIS) IMPLANT
KIT BASIN OR (CUSTOM PROCEDURE TRAY) ×1 IMPLANT
KIT TURNOVER KIT B (KITS) ×1 IMPLANT
NDL HYPO 22X1.5 SAFETY MO (MISCELLANEOUS) ×1 IMPLANT
NEEDLE HYPO 22X1.5 SAFETY MO (MISCELLANEOUS) ×1 IMPLANT
NS IRRIG 1000ML POUR BTL (IV SOLUTION) ×1 IMPLANT
PACK CRANIOTOMY CUSTOM (CUSTOM PROCEDURE TRAY) ×1 IMPLANT
PATTIES SURGICAL .5 X.5 (GAUZE/BANDAGES/DRESSINGS) ×1 IMPLANT
PATTIES SURGICAL .5 X3 (DISPOSABLE) IMPLANT
PATTIES SURGICAL 1X1 (DISPOSABLE) ×1 IMPLANT
PIN MAYFIELD SKULL DISP (PIN) IMPLANT
SPECIMEN JAR SMALL (MISCELLANEOUS) IMPLANT
SPIKE FLUID TRANSFER (MISCELLANEOUS) ×1 IMPLANT
SPONGE NEURO XRAY DETECT 1X3 (DISPOSABLE) IMPLANT
SPONGE SURGIFOAM ABS GEL 100 (HEMOSTASIS) ×1 IMPLANT
STAPLER VISISTAT (STAPLE) ×1 IMPLANT
SUT 3-0 BLK 1X30 PSL (SUTURE) IMPLANT
SUT NURALON 4 0 TR CR/8 (SUTURE) ×2 IMPLANT
SUT VIC AB 2-0 CP2 18 (SUTURE) ×2 IMPLANT
SUT VIC AB 4-0 RB1 18 (SUTURE) ×1 IMPLANT
SYR 30ML SLIP (SYRINGE) ×1 IMPLANT
TOWEL GREEN STERILE (TOWEL DISPOSABLE) ×1 IMPLANT
TOWEL GREEN STERILE FF (TOWEL DISPOSABLE) ×1 IMPLANT
TRAY FOLEY MTR SLVR 16FR STAT (SET/KITS/TRAYS/PACK) IMPLANT
TUBING FEATHERFLOW (TUBING) ×1 IMPLANT
UNDERPAD 30X36 HEAVY ABSORB (UNDERPADS AND DIAPERS) IMPLANT
WATER STERILE IRR 1000ML POUR (IV SOLUTION) ×1 IMPLANT

## 2023-10-04 NOTE — Progress Notes (Signed)
 NAME:  Jesse [name], MRN:  [id number], DOB:  March 14, 1941, LOS: 4 ADMISSION DATE:  09/30/2023, CONSULTATION DATE:  10/03/23 REFERRING MD:  NSGY, CHIEF COMPLAINT:  post op   History of Present Illness:   83 year old male with PMH as below, significant for of PAF not on AC, dementia, HOH, RLS, hypothyroidism, and prior known left frontal meningioma.  Was diagnosed initially in 2016 but refused prior surgical/ radiation interventions.  Presented 5/5 after unwitnessed fall, found lying on his back over his walker with left facial swelling and dislodgement of three teeth.  He was confused and perseverating on numbers.  Workup found to have enlarging left frontal meningioma now exerting mass effect with bilateral subdural hematomas with  rightward MLS.  Admitted to [facility] with NSGY and PMT consulting.  Remains full code and family agreed to surgical intervention.  Underwent craniotomy and resection on 5/8 by Dr. Ellery Guthrie with evacuation of SDH.  Pt extubated in PACU and transferred to Neuro ICU for post operative care.  PCCM consulted for medical management in ICU.   Pertinent  Medical History  Known meningioma, PAF not on AC, dementia, depression, hearing impairment, prostate ca s/p TURP/ radiation, RLS, hypothyroidism  Significant Hospital Events: Including procedures, antibiotic start and stop dates in addition to other pertinent events   5/5 admitted  5/8 craniotomy and tumor resection/ SDH evacuation 5/9 revision of craniotomy and evac of SDH after re-accumulated SDH with significant MLS/ neuro changes   Interim History / Subjective:  Less responsive this am with CT showing re-accumulation of SDH with 14mm MLS taken to OR for revision of crani and evacuation of SDH, extubated post op.  EBL , given albumin, s/p 2u FFP, 1.2L LR in OR  Objective    Blood pressure 120/89, pulse 76, temperature (!) 97.4 F (36.3 C), resp. rate 19, height 5\' 11"  (1.803 m), weight 74.7 kg, SpO2 95%.         Intake/Output Summary (Last 24 hours) at 10/04/2023 1115 Last data filed at 10/04/2023 1100 Gross per 24 hour  Intake 3048.07 ml  Output 2015 ml  Net 1033.07 ml   Filed Weights   09/30/23 2000  Weight: 74.7 kg    Examination: General:  elderly male lying in bed in NAD HEENT: MM pink/dry, pupils 3/r, large crani dressing w/ left JP drain Neuro: awake, slightly slurred speech, oriented to self, hospital, MAE, mild left facial swelling and ecchymosis to left chin/face CV: rr, NSR, no murmur PULM:  non labored, clear, room air GI: soft, bs hypo, foley- cyu Extremities: warm/dry, no LE edema  Skin: no rashes   labs> Na 132, K 4.4, CO2 21, sCr 0.8, normal CBC> 8am H/H 10.8/ 31, INR 1.2, plts 193, fibrinogen normal, PT 15.7  UOP 1.2L/ 24hrs  Resolved Hospital Problem list    Assessment & Plan:   Left frontal meningioma with mass effect with rightward MLS, bilateral SDH s/p left frontal craniotomy with tumor resection and SDH evacuation Unwitnessed fall Encephalopathy/ hx of dementia - EEG 5/5 neg for seizures  ABLA- s/p 2u FFP P:  - post op per NSGY - SBP goal < 160, prn labetalol  - decadron  and keppra  per NSGY - serial neuro exams, neuro protective measures  - airway watch, aspiration precautions. Remains npo for now - repeat CBC and coags now - repeat CTH at 1600 or if any neuro changes - follow surgical pathology - protecting airway, cont aspiration precautions - complete post op abx - SCDs - minimize  sedating meds, tylenol  prn pain - PT/ OT - continue foley - PMT following   Hyponatremia - No significant UOP, trend on BMET   Hypokalemia - stable, trend labs  PAF - following cervical laminectomy in 2013, treated short term with amiodarone and AC P:  - remains NSR, monitor on tele  Hypothyroidism - cont synthroid    Best Practice (right click and "Reselect all SmartList Selections" daily)   Diet/type: NPO DVT prophylaxis SCD Pressure ulcer(s):  N/A GI prophylaxis: PPI Lines: Arterial Line Foley:  Yes, and it is still needed Code Status:  full code Last date of multidisciplinary goals of care discussion [ongoing, PMT following]  Wife and son updated at bedside  Labs   CBC: Recent Labs  Lab 09/30/23 0135 10/01/23 0550 10/04/23 0815  WBC 7.4 7.6  --   HGB 13.7 14.5 10.8*  HCT 40.3 41.3 31.2*  MCV 87.4 84.8  --   PLT 206 222 193    Basic Metabolic Panel: Recent Labs  Lab 09/30/23 0135 09/30/23 1711 10/01/23 0550  NA 128* 135 132*  K 4.1 3.3* 4.4  CL 95* 108 105  CO2 21* 19* 21*  GLUCOSE 103* 93 134*  BUN 17 8 12   CREATININE 0.93 0.63 0.80  CALCIUM  9.0 6.9* 9.1   GFR: Estimated Creatinine Clearance: 73.9 mL/min (by C-G formula based on SCr of 0.8 mg/dL). Recent Labs  Lab 09/30/23 0135 10/01/23 0550  WBC 7.4 7.6    Liver Function Tests: Recent Labs  Lab 09/30/23 0135  AST 32  ALT 18  ALKPHOS 67  BILITOT 0.8  PROT 6.5  ALBUMIN 4.0   No results for input(s): "LIPASE", "AMYLASE" in the last 168 hours. No results for input(s): "AMMONIA" in the last 168 hours.  ABG No results found for: "PHART", "PCO2ART", "PO2ART", "HCO3", "TCO2", "ACIDBASEDEF", "O2SAT"   Coagulation Profile: Recent Labs  Lab 09/30/23 0317 10/04/23 0815  INR 1.1 1.2    Cardiac Enzymes: No results for input(s): "CKTOTAL", "CKMB", "CKMBINDEX", "TROPONINI" in the last 168 hours.  HbA1C: Hgb A1c MFr Bld  Date/Time Value Ref Range Status  08/26/2020 02:25 PM 5.7 4.6 - 6.5 % Final    Comment:    Glycemic Control Guidelines for People with Diabetes:Non Diabetic:  <6%Goal of Therapy: <7%Additional Action Suggested:  >8%     CBG: Recent Labs  Lab 09/30/23 0146 09/30/23 0157  GLUCAP 109* 109*  Allergies No Known Allergies   Home Medications  Prior to Admission medications   Medication Sig Start Date End Date Taking? Authorizing Provider  Cyanocobalamin  (VITAMIN B 12 PO) Take 1,000 Units by mouth 3 (three) times a  week.    Yes [provider]  SYNTHROID  50 MCG tablet TAKE 1 TABLET DAILY BEFORE BREAKFAST 07/18/21  Yes Rodney Clamp, MD     Critical care time: 35 mins       Early Glisson, MSN, AG-ACNP-BC  [facility] 10/04/2023, 11:15 AM  See Amion for pager If no response to pager , please call [phone] until 7pm After 7:00 pm call Elink  [phone]

## 2023-10-04 NOTE — Op Note (Signed)
 Date of surgery: 10/04/2023 Preoperative diagnosis: Acute subdural hematoma with mass effect and shift, neurologic deterioration postoperatively. Postoperative diagnosis: Same Procedure: Revision craniotomy with evacuation of subdural hematoma Surgeon: Elna Haggis Anesthesia: General Endotracheal Indications: Jesse [name] is an 83 year old individual who underwent a craniotomy for brain tumor yesterday.  At the time of surgery was noted that he had some acute subdural hematoma on the scan but at the time of surgery was noted that this was acute on chronic subdural with membranes present.  The membranes were resected hematoma was removed and the tumor was removed.  Postoperatively the patient did well however in the early morning hours was noted that he became less responsive and was perseverating his speech.  A CT scan demonstrated presence of an accumulated acute subdural hematoma with substantial shift and mass effect.  He is taken back to the operating room.  Procedure: Patient was brought to the operating room and placed on table supine position.  After the smooth induction of general tracheal anesthesia the head was unwrapped placed on a soft doughnut turned to the right side.  The previous closure was reopened by removing the surgical staples and removing the anterior nylon sutures.  The scalp was prepped with alcohol and then Betadine  solution.  The galea was opened releasing the sutures and flapped forward.  There was noted to be significant fluid with blood in it in the subgaleal space.  The bone flap was then raised after pinning the scalp flap forward with some fishhooks.  Underlying this was noted to be substantial epidural blood with the dura being tented to the underlying bone.  This was gently resected with irrigation and then the dura was opened releasing the 4-0 Nurolon sutures.  Underlying this was noted to be significant acute subdural blood and some bloody fluid.  This was irrigated  away and gradually evacuated.  During this time it was noted that all of the skin edges bled rather profusely and hemostasis was controlled with the use of a bipolar cautery the dura itself was bleeding also from all the edges and the open ventral and dorsal surfaces.  This was also cauterized and tamponaded with Gelfoam and padding.  The surface of the brain itself was quite hemostatic.  In the bed of the tumor there was noted to be some small amount of accumulated blood but no gross bleeding surfaces were noted.  There was some bleeding from the medial aspect this was mostly venous blood and some simple cautery controlled this however the oozing edges from the dura were difficult to control the some of the edges of the brain that had previously been cauterized also required recauterization.  There is concerned that the patient may have a bleeding disorder and 2 units of fresh frozen plasma were ordered his platelet count was noted to be 190,000.  The procedure continued until the bleeding could be arrested from all the visible spaces.  The dura itself was cauterized so much that it had contracted considerably.  After adequate control was established the dura was reapproximated loosely with 4-0 Nurolon suture.  Hemostasis was doubly checked and when well assured a singular tack up was replaced to the bone and the bone was replaced.  Several bony bleeding areas were controlled with some bone wax.  Further tenting sutures were placed along the medial aspect of the dura were not had been placed previously.  With this the bone flap was replaced and secured with a singular tenting suture and the 6  dog bone plates that had been previously used.  The galea was reflected and closed with 2-0 Vicryl in interrupted fashion and a running nylon suture was used to close the scalp as the scalp edges were still noted to have some considerable ooze for this procedure a blood loss estimate was about 800 cc.  He received 2 units of  fresh frozen plasma albumin and crystalloid.  No blood was given in the operating room.  Postoperative hematocrit will be checked.

## 2023-10-04 NOTE — Anesthesia Preprocedure Evaluation (Signed)
 Anesthesia Evaluation  Patient identified by MRN, date of birth, ID band Patient awake and Patient confused    Reviewed: Allergy & Precautions, NPO status , Patient's Chart, lab work & pertinent test resultsPreop documentation limited or incomplete due to emergent nature of procedure.  Airway Mallampati: III  TM Distance: >3 FB Neck ROM: Full    Dental  (+) Dental Advisory Given, Missing   Pulmonary former smoker   Pulmonary exam normal breath sounds clear to auscultation       Cardiovascular Normal cardiovascular exam+ dysrhythmias Atrial Fibrillation  Rhythm:Regular Rate:Normal     Neuro/Psych  PSYCHIATRIC DISORDERS Anxiety Depression   Dementia  SUBDURAL HEMATOMA  POD1 s/p Craniotomy and resection of brain tumor (meningioma) evacuation of subdural hematoma.    GI/Hepatic negative GI ROS, Neg liver ROS,,,  Endo/Other  Hypothyroidism    Renal/GU negative Renal ROS   H/o prostate cancer     Musculoskeletal  (+) Arthritis ,    Abdominal   Peds  Hematology negative hematology ROS (+)   Anesthesia Other Findings Day of surgery medications reviewed with the patient.  Prostate cancer   Reproductive/Obstetrics                              Anesthesia Physical Anesthesia Plan  ASA: 4 and emergent  Anesthesia Plan: General   Post-op Pain Management: Ofirmev  IV (intra-op)*   Induction: Intravenous  PONV Risk Score and Plan: 2 and TIVA, Dexamethasone  and Ondansetron   Airway Management Planned: Oral ETT  Additional Equipment: Arterial line  Intra-op Plan:   Post-operative Plan: Post-operative intubation/ventilation  Informed Consent: I have reviewed the patients History and Physical, chart, labs and discussed the procedure including the risks, benefits and alternatives for the proposed anesthesia with the patient or authorized representative who has indicated his/her understanding  and acceptance.     Dental advisory given  Plan Discussed with: CRNA  Anesthesia Plan Comments:         Anesthesia Quick Evaluation

## 2023-10-04 NOTE — Progress Notes (Signed)
 eLink Physician-Brief Progress Note Patient Name: Jesse [name] DOB: June 07, 1940 MRN: [id number]   Date of Service  10/04/2023  HPI/Events of Note  Patient with a change in mental status.  eICU Interventions  Stat CT head ordered.        Naevia Unterreiner U Taiden Raybourn 10/04/2023, 6:39 AM

## 2023-10-04 NOTE — Progress Notes (Signed)
 PT Cancellation Note  Patient Details Name: Jesse [name] MRN: [id number] DOB: 13-Apr-1941   Cancelled Treatment:    Reason Eval/Treat Not Completed: (P) Patient's level of consciousness. RN requesting hold off on therapy this date as pt is very lethargic after just having craniotomy and resection of brain tumor (meningioma) evacuation of subdural hematoma 5/8 then revision craniotomy with evacuation of subdural hematoma today due to CT scan demonstrating presence of an accumulated acute subdural hematoma with substantial shift and mass effect. Will plan to follow-up another day as able.   Vernida Goodie, PT, DPT Acute Rehabilitation Services  Office: [phone]    Ellyn Hack 10/04/2023, 1:21 PM

## 2023-10-04 NOTE — Transfer of Care (Signed)
 Immediate Anesthesia Transfer of Care Note  Patient: Jesse [name]  Procedure(s) Performed: CRANIOTOMY HEMATOMA EVACUATION SUBDURAL (Head)  Patient Location: PACU  Anesthesia Type:General  Level of Consciousness: awake and drowsy  Airway & Oxygen Therapy: Patient Spontanous Breathing and Patient connected to face mask oxygen  Post-op Assessment: Report given to RN and Post -op Vital signs reviewed and stable  Post vital signs: Reviewed and stable  Last Vitals:  Vitals Value Taken Time  BP    Temp    Pulse    Resp    SpO2      Last Pain:  Vitals:   10/04/23 0600  TempSrc:   PainSc: Asleep         Complications: No notable events documented.

## 2023-10-04 NOTE — Anesthesia Procedure Notes (Signed)
 Procedure Name: Intubation Date/Time: 10/04/2023 8:00 AM  Performed by: Erin Havers, MDPre-anesthesia Checklist: Patient identified, Emergency Drugs available, Suction available and Patient being monitored Patient Re-evaluated:Patient Re-evaluated prior to induction Oxygen Delivery Method: Circle system utilized Preoxygenation: Pre-oxygenation with 100% oxygen Induction Type: IV induction Ventilation: Mask ventilation without difficulty Laryngoscope Size: 4 and Mac Grade View: Grade III Tube type: Oral Tube size: 7.5 mm Number of attempts: 2 Airway Equipment and Method: Stylet and Oral airway Placement Confirmation: ETT inserted through vocal cords under direct vision, positive ETCO2 and breath sounds checked- equal and bilateral Secured at: 23 cm Tube secured with: Tape Dental Injury: Teeth and Oropharynx as per pre-operative assessment  Comments: Attempt x1 by SRNA with Grade 3 view. Unable to advance ETT.  Attempt x1 by MDA with suctioning, grade 3 view, successful intubation.

## 2023-10-04 NOTE — Progress Notes (Signed)
 E-link notified that patient is not participating in orientation questions. Otherwise neuro exam unchanged. Patient responsive and opens eyes to voice, moves all extremities, follows commands. Pupils are equal round and reactive.

## 2023-10-05 DIAGNOSIS — E039 Hypothyroidism, unspecified: Secondary | ICD-10-CM | POA: Diagnosis not present

## 2023-10-05 DIAGNOSIS — I629 Nontraumatic intracranial hemorrhage, unspecified: Secondary | ICD-10-CM | POA: Diagnosis not present

## 2023-10-05 DIAGNOSIS — G9389 Other specified disorders of brain: Secondary | ICD-10-CM | POA: Diagnosis not present

## 2023-10-05 LAB — TYPE AND SCREEN
ABO/RH(D): B POS
Antibody Screen: NEGATIVE
Unit division: 0
Unit division: 0

## 2023-10-05 LAB — PREPARE FRESH FROZEN PLASMA

## 2023-10-05 LAB — BPAM FFP
Blood Product Expiration Date: 202505142359
Blood Product Expiration Date: 202505142359
ISSUE DATE / TIME: 202505090925
ISSUE DATE / TIME: 202505090925
Unit Type and Rh: 7300
Unit Type and Rh: 7300

## 2023-10-05 LAB — BPAM RBC
Blood Product Expiration Date: 202506052359
Blood Product Expiration Date: 202506052359
Unit Type and Rh: 7300
Unit Type and Rh: 7300

## 2023-10-05 LAB — BASIC METABOLIC PANEL WITH GFR
Anion gap: 8 (ref 5–15)
BUN: 19 mg/dL (ref 8–23)
CO2: 24 mmol/L (ref 22–32)
Calcium: 8.2 mg/dL — ABNORMAL LOW (ref 8.9–10.3)
Chloride: 102 mmol/L (ref 98–111)
Creatinine, Ser: 0.69 mg/dL (ref 0.61–1.24)
GFR, Estimated: >60 mL/min (ref >60)
Glucose, Bld: 118 mg/dL — ABNORMAL HIGH (ref 70–99)
Potassium: 3.9 mmol/L (ref 3.5–5.1)
Sodium: 134 mmol/L — ABNORMAL LOW (ref 135–145)

## 2023-10-05 LAB — CBC
HCT: 27 % — ABNORMAL LOW (ref 39.0–52.0)
Hemoglobin: 9.3 g/dL — ABNORMAL LOW (ref 13.0–17.0)
MCH: 30.3 pg (ref 26.0–34.0)
MCHC: 34.4 g/dL (ref 30.0–36.0)
MCV: 87.9 fL (ref 80.0–100.0)
Platelets: 162 10*3/uL (ref 150–400)
RBC: 3.07 MIL/uL — ABNORMAL LOW (ref 4.22–5.81)
RDW: 13.3 % (ref 11.5–15.5)
WBC: 8.9 10*3/uL (ref 4.0–10.5)
nRBC: 0 % (ref 0.0–0.2)

## 2023-10-05 LAB — MAGNESIUM: Magnesium: 2.1 mg/dL (ref 1.7–2.4)

## 2023-10-05 MED ORDER — TAMSULOSIN HCL 0.4 MG PO CAPS
0.4000 mg | ORAL_CAPSULE | Freq: Every day | ORAL | Status: DC
  Administered 2023-10-05 – 2023-10-10 (×6): 0.4 mg via ORAL
  Filled 2023-10-05 (×6): qty 1

## 2023-10-05 NOTE — Progress Notes (Signed)
 Patient ID: Jesse [name], male   DOB: [birth year], 83 y.o.   MRN: [id number] BP 114/62   Pulse (!) 59   Temp (!) 97.5 F (36.4 C) (Axillary)   Resp 12   Ht 5\' 11"  (1.803 m)   Wt 74.7 kg   SpO2 97%   BMI 22.97 kg/m  Lethargic, arouses to voice Following all commands Left facial droop Moving extremities Wound is clean, dry, no signs of infection

## 2023-10-05 NOTE — Progress Notes (Signed)
 NAME:  Jesse [name], MRN:  [id number], DOB:  1941-05-13, LOS: 5 ADMISSION DATE:  09/30/2023, CONSULTATION DATE:  10/03/23 REFERRING MD:  NSGY, CHIEF COMPLAINT:  post op   History of Present Illness:   83 year old male with PMH as below, significant for of PAF not on AC, dementia, HOH, RLS, hypothyroidism, and prior known left frontal meningioma.  Was diagnosed initially in 2016 but refused prior surgical/ radiation interventions.  Presented 5/5 after unwitnessed fall, found lying on his back over his walker with left facial swelling and dislodgement of three teeth.  He was confused and perseverating on numbers.  Workup found to have enlarging left frontal meningioma now exerting mass effect with bilateral subdural hematomas with  rightward MLS.  Admitted to [facility] with NSGY and PMT consulting.  Remains full code and family agreed to surgical intervention.  Underwent craniotomy and resection on 5/8 by Dr. Ellery Guthrie with evacuation of SDH.  Pt extubated in PACU and transferred to Neuro ICU for post operative care.  PCCM consulted for medical management in ICU.   Pertinent  Medical History  Known meningioma, PAF not on AC, dementia, depression, hearing impairment, prostate ca s/p TURP/ radiation, RLS, hypothyroidism  Significant Hospital Events: Including procedures, antibiotic start and stop dates in addition to other pertinent events   5/5 admitted  5/8 craniotomy and tumor resection/ SDH evacuation 5/9 Less responsive this am with CT showing re-accumulation of SDH with 14mm MLS taken to OR for revision of crani and evacuation of SDH, extubated post op.  EBL , given albumin, s/p 2u FFP, 1.2L LR in OR 5/10 no issues overnight  Interim History / Subjective:  Resting in bed this a.m. with no acute complaints  Objective    Blood pressure (!) 94/49, pulse (!) 55, temperature (!) 97.5 F (36.4 C), temperature source Axillary, resp. rate 12, height 5\' 11"  (1.803 m), weight 74.7 kg, SpO2 93%.         Intake/Output Summary (Last 24 hours) at 10/05/2023 0855 Last data filed at 10/05/2023 0640 Gross per 24 hour  Intake 2197.88 ml  Output 2133 ml  Net 64.88 ml   Filed Weights   09/30/23 2000  Weight: 74.7 kg    Examination: General: Acute on chronic ill-appearing deconditioned elderly male lying in bed in no acute distress HEENT: [place]/AT, MM pink/moist, PERRL,  Neuro: Alert and oriented x 3, nonfocal CV: s1s2 regular rate and rhythm, no murmur, rubs, or gallops,  PULM: Auscultation bilaterally, no increased work of breathing, no added breath sounds, on room air. GI: soft, bowel sounds active in all 4 quadrants, non-tender, non-distended Extremities: warm/dry, no edema  Skin: no rashes or lesions  Resolved Hospital Problem list   Hypokalemia  Assessment & Plan:   Left frontal meningioma with mass effect with rightward MLS, bilateral SDH s/p left frontal craniotomy with tumor resection and SDH evacuation Unwitnessed fall Encephalopathy/ hx of dementia - EEG 5/5 neg for seizures  ABLA- s/p 2u FFP P:  Postop management per neurosurgery SBP goal less than 160 Decadron  and Keppra  per neurosurgery Frequent neuroexams JP drain per neurosurgery Further imaging per neurosurgery PT/OT Mobilize as able Multimodal pain control  Hyponatremia - Baseline sodium appears to range between 132-138 P: Continue to trend bmet  PAF - following cervical laminectomy in 2013, treated short term with amiodarone and AC.  Remains in sinus rhythm P:  Continuous telemetry  Hypothyroidism P: Continue home Synthroid    Best Practice (right click and "Reselect all SmartList Selections"  daily)   Diet/type: NPO DVT prophylaxis SCD Pressure ulcer(s): N/A GI prophylaxis: PPI Lines: Arterial Line Foley:  Yes, and it is still needed Code Status:  full code Last date of multidisciplinary goals of care discussion: No family at bedside this a.m., update on arrival  Critical care time: NA    Zeffie Bickert D. Harris, NP-C [facility] Personal contact information can be found on Amion  If no contact or response made please call [phone] 10/05/2023, 8:57 AM

## 2023-10-05 NOTE — Anesthesia Postprocedure Evaluation (Signed)
 Anesthesia Post Note  Patient: Jesse [name]  Procedure(s) Performed: CRANIOTOMY HEMATOMA EVACUATION SUBDURAL (Head)     Patient location during evaluation: PACU Anesthesia Type: General Level of consciousness: awake Pain management: pain level controlled Vital Signs Assessment: post-procedure vital signs reviewed and stable Respiratory status: spontaneous breathing, nonlabored ventilation, respiratory function stable and patient connected to nasal cannula oxygen Cardiovascular status: blood pressure returned to baseline and stable Postop Assessment: no apparent nausea or vomiting Anesthetic complications: no   No notable events documented.  Last Vitals:    Last Pain:                 Erin Havers

## 2023-10-06 ENCOUNTER — Encounter (HOSPITAL_COMMUNITY): Payer: Self-pay | Admitting: Neurological Surgery

## 2023-10-06 DIAGNOSIS — E039 Hypothyroidism, unspecified: Secondary | ICD-10-CM | POA: Diagnosis not present

## 2023-10-06 DIAGNOSIS — G9389 Other specified disorders of brain: Secondary | ICD-10-CM | POA: Diagnosis not present

## 2023-10-06 DIAGNOSIS — I629 Nontraumatic intracranial hemorrhage, unspecified: Secondary | ICD-10-CM | POA: Diagnosis not present

## 2023-10-06 LAB — CBC
HCT: 30.2 % — ABNORMAL LOW (ref 39.0–52.0)
Hemoglobin: 10 g/dL — ABNORMAL LOW (ref 13.0–17.0)
MCH: 29.7 pg (ref 26.0–34.0)
MCHC: 33.1 g/dL (ref 30.0–36.0)
MCV: 89.6 fL (ref 80.0–100.0)
Platelets: 169 10*3/uL (ref 150–400)
RBC: 3.37 MIL/uL — ABNORMAL LOW (ref 4.22–5.81)
RDW: 13.2 % (ref 11.5–15.5)
WBC: 7.4 10*3/uL (ref 4.0–10.5)
nRBC: 0 % (ref 0.0–0.2)

## 2023-10-06 LAB — BASIC METABOLIC PANEL WITH GFR
Anion gap: 8 (ref 5–15)
BUN: 19 mg/dL (ref 8–23)
CO2: 24 mmol/L (ref 22–32)
Calcium: 8.3 mg/dL — ABNORMAL LOW (ref 8.9–10.3)
Chloride: 100 mmol/L (ref 98–111)
Creatinine, Ser: 0.7 mg/dL (ref 0.61–1.24)
GFR, Estimated: >60 mL/min (ref >60)
Glucose, Bld: 125 mg/dL — ABNORMAL HIGH (ref 70–99)
Potassium: 4.1 mmol/L (ref 3.5–5.1)
Sodium: 132 mmol/L — ABNORMAL LOW (ref 135–145)

## 2023-10-06 LAB — POCT I-STAT 7, (LYTES, BLD GAS, ICA,H+H)
Acid-base deficit: 2 mmol/L (ref 0.0–2.0)
Bicarbonate: 21.8 mmol/L (ref 20.0–28.0)
Calcium, Ion: 1.12 mmol/L — ABNORMAL LOW (ref 1.15–1.40)
HCT: 27 % — ABNORMAL LOW (ref 39.0–52.0)
Hemoglobin: 9.2 g/dL — ABNORMAL LOW (ref 13.0–17.0)
O2 Saturation: 100 %
Patient temperature: 36.1
Potassium: 4 mmol/L (ref 3.5–5.1)
Sodium: 133 mmol/L — ABNORMAL LOW (ref 135–145)
TCO2: 23 mmol/L (ref 22–32)
pCO2 arterial: 33.1 mmHg (ref 32–48)
pH, Arterial: 7.423 (ref 7.35–7.45)
pO2, Arterial: 273 mmHg — ABNORMAL HIGH (ref 83–108)

## 2023-10-06 MED ORDER — LEVETIRACETAM 500 MG PO TABS
500.0000 mg | ORAL_TABLET | Freq: Two times a day (BID) | ORAL | Status: DC
  Administered 2023-10-06 – 2023-10-09 (×8): 500 mg via ORAL
  Filled 2023-10-06 (×8): qty 1

## 2023-10-06 MED ORDER — OXYCODONE HCL 5 MG PO TABS: 5.0000 mg | ORAL_TABLET | Freq: Four times a day (QID) | ORAL | Status: DC | PRN

## 2023-10-06 NOTE — Progress Notes (Signed)
 NAME:  Jesse [name], MRN:  [id number], DOB:  10/30/1940, LOS: 6 ADMISSION DATE:  09/30/2023, CONSULTATION DATE:  10/03/23 REFERRING MD:  NSGY, CHIEF COMPLAINT:  post op   History of Present Illness:   83 year old male with PMH as below, significant for of PAF not on AC, dementia, HOH, RLS, hypothyroidism, and prior known left frontal meningioma.  Was diagnosed initially in 2016 but refused prior surgical/ radiation interventions.  Presented 5/5 after unwitnessed fall, found lying on his back over his walker with left facial swelling and dislodgement of three teeth.  He was confused and perseverating on numbers.  Workup found to have enlarging left frontal meningioma now exerting mass effect with bilateral subdural hematomas with  rightward MLS.  Admitted to [facility] with NSGY and PMT consulting.  Remains full code and family agreed to surgical intervention.  Underwent craniotomy and resection on 5/8 by Dr. Ellery Guthrie with evacuation of SDH.  Pt extubated in PACU and transferred to Neuro ICU for post operative care.  PCCM consulted for medical management in ICU.   Pertinent  Medical History  Known meningioma, PAF not on AC, dementia, depression, hearing impairment, prostate ca s/p TURP/ radiation, RLS, hypothyroidism  Significant Hospital Events: Including procedures, antibiotic start and stop dates in addition to other pertinent events   5/5 admitted  5/8 craniotomy and tumor resection/ SDH evacuation 5/9 Less responsive this am with CT showing re-accumulation of SDH with 14mm MLS taken to OR for revision of crani and evacuation of SDH, extubated post op.  EBL , given albumin, s/p 2u FFP, 1.2L LR in OR 5/10 no issues overnight  5/11 urinary retention overnight now s/p 4 in and out caths, placed indwelling Foley this a.m.  Interim History / Subjective:  More easily arousable this morning, follows all commands and answers all orientation questions  Objective    Blood pressure 126/75, pulse 79,  temperature 97.9 F (36.6 C), temperature source Axillary, resp. rate 17, height 5\' 11"  (1.803 m), weight 74.7 kg, SpO2 94%.        Intake/Output Summary (Last 24 hours) at 10/06/2023 0746 Last data filed at 10/06/2023 2440 Gross per 24 hour  Intake 485 ml  Output 1570 ml  Net -1085 ml   Filed Weights   09/30/23 2000  Weight: 74.7 kg    Examination: General: Acute on chronic ill-appearing elderly male lying in bed in no acute distress HEENT: [place]/AT, MM pink/moist, PERRL,  Neuro: Alert and oriented x 3, nonfocal CV: s1s2 regular rate and rhythm, no murmur, rubs, or gallops,  PULM: Clear to auscultation bilaterally, no increased work of breathing, no added breath sounds, on room air GI: soft, bowel sounds active in all 4 quadrants, non-tender, non-distended, tolerating  Extremities: warm/dry, no edema  Skin: Surgical incisions with staples to head  Resolved Hospital Problem list   Hypokalemia  Assessment & Plan:   Left frontal meningioma with mass effect with rightward MLS, bilateral SDH s/p left frontal craniotomy with tumor resection and SDH evacuation Unwitnessed fall Encephalopathy/ hx of dementia - EEG 5/5 neg for seizures  ABLA- s/p 2u FFP P:  Postop management per neurosurgery SBP goal less than 160 Decadron  and Keppra  per neurosurgery Frequent neurochecks JP drain per neurosurgery Mobilize as able Multimodal pain control PT/OT  Hyponatremia - Baseline sodium appears to range between 132-138 P: Stable, continue to trend [place]  PAF - following cervical laminectomy in 2013, treated short term with amiodarone and AC.  Remains in sinus rhythm P:  Continuous telemetry  Hypothyroidism P: Home Synthroid   Urinary retention - Wife states that patient has recently struggled with urinary frequency and difficulty emptying bladder with history of partial prostatectomy.  She also reported high-dose Flomax of 0.8 resulted in hypotension in the past -Required 4  In-N-Out caths overnight 5/11 P: Place indwelling Foley for 24 hours after attempt voiding trial Flomax 0.4 started 5/10, continue  Best Practice (right click and "Reselect all SmartList Selections" daily)   Diet/type: NPO DVT prophylaxis SCD Pressure ulcer(s): N/A GI prophylaxis: PPI Lines: Arterial Line Foley:  Yes, and it is still needed Code Status:  full code Last date of multidisciplinary goals of care discussion: No family at bedside this a.m., update on arrival  Critical care time: NA   Danyelle Brookover D. Harris, NP-C [facility] Personal contact information can be found on Amion  If no contact or response made please call [phone] 10/06/2023, 7:46 AM

## 2023-10-06 NOTE — Progress Notes (Signed)
 Patient ID: Jesse [name], male   DOB: 01/22/1941, 83 y.o.   MRN: [id number] BP 127/69   Pulse 61   Temp 97.7 F (36.5 C) (Oral)   Resp (!) 22   Ht 5\' 11"  (1.803 m)   Wt 74.7 kg   SpO2 (!) 87%   BMI 22.97 kg/m  Alert, oriented x 4, speech is clear and fluent Moving all extremities No drift Wound is clean, dry, no signs of infection Removed JP

## 2023-10-06 NOTE — Evaluation (Signed)
 Physical Therapy Evaluation Patient Details Name: Jesse [name] MRN: [id number] DOB: [birth year] Today's Date: 10/06/2023  History of Present Illness  Patient is 82 y.o. male presenting 5/5 after a fall at home. Work up revealed known L frontal meningioma with increased size since last imaging. S/p craniotomy with resection of tumor and evacuation of SDH on 5/8. Pt with decreased responsiveness and CT showed acute SDH with 14mm shift, returned to OR for L crani revision 5/9. PMH significant of dementia, atrial fibrillation, hypothyroidism, meningioma, prostate cancer.   Clinical Impression  Pt seen for re-evaluation after L crani for tumor resection and subsequent revision the next morning. The pt was able to follow commands with all extremities, but presents with flat affect and c/o dizziness with all mobility. Pt answering orientation questions accurately, able to follow commands but generally flat affect with minimal conversation. The pt's wife was present and supportive, even to the point of providing physical assistance to the patient in the session. He requires modA to manage bed mobility with cues for sequencing, use of bed rails, and assist at pat to complete scooting to EOB. The pt was able to complete sit-stand with assist from wife and RW, benefits from modA to manage RW and balance for lateral stepping. Despite VSS, pt limited this session by pain and fatigue, will continue to follow and progress transfers and gait as tolerated, continue to recommend intensive therapies once medically stable for d/c.       If plan is discharge home, recommend the following: Two people to help with walking and/or transfers;A lot of help with bathing/dressing/bathroom;Assistance with cooking/housework;Assistance with feeding;Direct supervision/assist for medications management;Help with stairs or ramp for entrance;Assist for transportation;Supervision due to cognitive status   Can travel by private vehicle    No    Equipment Recommendations Wheelchair (measurements PT);Wheelchair cushion (measurements PT);Hospital bed (TBD at next venue)  Recommendations for Other Services  Rehab consult;OT consult    Functional Status Assessment Patient has had a recent decline in their functional status and demonstrates the ability to make significant improvements in function in a reasonable and predictable amount of time.     Precautions / Restrictions Precautions Precautions: Fall Recall of Precautions/Restrictions: Impaired Precaution/Restrictions Comments: L crani, JP drain, SBP <160 Restrictions Weight Bearing Restrictions Per Provider Order: No      Mobility  Bed Mobility Overal bed mobility: Needs Assistance Bed Mobility: Supine to Sit, Sit to Supine     Supine to sit: Mod assist, Used rails, HOB elevated Sit to supine: Mod assist, HOB elevated, Used rails   General bed mobility comments: step by step cues for sequencing LE's off EOB and to reach for bed rail. Mod assist to raise trunk and cues for anterior lean to maintain seated balance at EOB. pt reports dizziness in sitting but BP stable.    Transfers Overall transfer level: Needs assistance Equipment used: Rolling walker (2 wheels) Transfers: Sit to/from Stand Sit to Stand: Mod assist, +2 safety/equipment           General transfer comment: cues for hand placement on bed, wife assisting to lift pt and PT providing line management and CGA. no buckling noted, pt denies change in dizziness pt declined chair due to fatigue at this time    Ambulation/Gait Ambulation/Gait assistance: Min assist Gait Distance (Feet): 4 Feet Assistive device: Rolling walker (2 wheels) Gait Pattern/deviations: Step-to pattern, Decreased stride length Gait velocity: decreased     General Gait Details: small lateral steps with assist to manage  RW. limited by fatigue  Modified Rankin (Stroke Patients Only) Modified Rankin (Stroke Patients  Only) Pre-Morbid Rankin Score: Slight disability Modified Rankin: Moderately severe disability     Balance Overall balance assessment: History of Falls, Needs assistance Sitting-balance support: Feet supported, Bilateral upper extremity supported Sitting balance-Leahy Scale: Poor Sitting balance - Comments: dependent on min-modA to maintain   Standing balance support: Reliant on assistive device for balance, Bilateral upper extremity supported Standing balance-Leahy Scale: Poor Standing balance comment: dependent on BUE support and external support                             Pertinent Vitals/Pain Pain Assessment Pain Assessment: Faces Pain Location: generalized, headache Pain Descriptors / Indicators: Discomfort Pain Intervention(s): Limited activity within patient's tolerance, Monitored during session, Repositioned    Home Living Family/patient expects to be discharged to:: Private residence Living Arrangements: Spouse/significant other Available Help at Discharge: Family Type of Home: House Home Access: Level entry       Home Layout: Two level;1/2 bath on main level;Able to live on main level with bedroom/bathroom Home Equipment: Rolling Walker (2 wheels);BSC/3in1;Wheelchair - manual      Prior Function Prior Level of Function : Needs assist;Patient poor historian/Family not available             Mobility Comments: per wife, pt using RW in the home ADLs Comments: per pt report requires assist from spouse for ADLs     Extremity/Trunk Assessment   Upper Extremity Assessment Upper Extremity Assessment: Defer to OT evaluation    Lower Extremity Assessment Lower Extremity Assessment: RLE deficits/detail;Generalized weakness RLE Deficits / Details: grossly 4/5 to MMT, reports no change in sensation. needs further testing when pt needing less hands-on assist for balance. functional without buckling to stand RLE Sensation: WNL    Cervical / Trunk  Assessment Cervical / Trunk Assessment: Kyphotic;Other exceptions Cervical / Trunk Exceptions: L crani, R cervical rotation and flexion, R gaze preference  Communication   Communication Communication: Impaired Factors Affecting Communication: Hearing impaired (soft spoken)    Cognition Arousal: Alert Behavior During Therapy: Flat affect   PT - Cognitive impairments: Memory, Initiation, Sequencing, Problem solving, Safety/Judgement, Attention, Awareness                       PT - Cognition Comments: pt answering orientation questions accurately, able to follow commands but generally flat affect with minimal conversation. pt defers to wife for answering Following commands: Impaired Following commands impaired: Follows one step commands inconsistently, Follows one step commands with increased time     Cueing Cueing Techniques: Verbal cues     General Comments General comments (skin integrity, edema, etc.): VSS on RA, wife present and supportive. MD present at end of session to remove JP    Exercises Other Exercises Other Exercises: standing marches   Assessment/Plan    PT Assessment Patient needs continued PT services  PT Problem List Decreased strength;Decreased mobility;Decreased balance;Decreased activity tolerance;Decreased cognition;Decreased knowledge of use of DME;Decreased safety awareness;Decreased knowledge of precautions       PT Treatment Interventions DME instruction;Gait training;Stair training;Functional mobility training;Therapeutic activities;Therapeutic exercise;Balance training;Neuromuscular re-education;Cognitive remediation;Patient/family education;Wheelchair mobility training    PT Goals (Current goals can be found in the Care Plan section)  Acute Rehab PT Goals Patient Stated Goal: none specified PT Goal Formulation: Patient unable to participate in goal setting Time For Goal Achievement: 10/20/23 Potential to Achieve Goals: Fair  Frequency  Min 2X/week        AM-PAC PT "6 Clicks" Mobility  Outcome Measure Help needed turning from your back to your side while in a flat bed without using bedrails?: A Lot Help needed moving from lying on your back to sitting on the side of a flat bed without using bedrails?: A Lot Help needed moving to and from a bed to a chair (including a wheelchair)?: A Lot Help needed standing up from a chair using your arms (e.g., wheelchair or bedside chair)?: A Lot Help needed to walk in hospital room?: Total Help needed climbing 3-5 steps with a railing? : Total 6 Click Score: 10    End of Session Equipment Utilized During Treatment: Gait belt Activity Tolerance: Patient tolerated treatment well Patient left: in bed;with call bell/phone within reach;with bed alarm set;with SCD's reapplied;Other (comment) (chair position) Nurse Communication: Mobility status PT Visit Diagnosis: Unsteadiness on feet (R26.81);Muscle weakness (generalized) (M62.81);Other abnormalities of gait and mobility (R26.89);History of falling (Z91.81);Difficulty in walking, not elsewhere classified (R26.2)    Time: 1610-9604 PT Time Calculation (min) (ACUTE ONLY): 23 min   Charges:   PT Evaluation $PT Re-evaluation: 1 Re-eval PT Treatments $Therapeutic Activity: 8-22 mins PT General Charges $$ ACUTE PT VISIT: 1 Visit         Barnabas Booth, PT, DPT   Acute Rehabilitation Department Office [phone] Secure Chat Communication Preferred   Jesse [name] 10/06/2023, 1:28 PM

## 2023-10-06 NOTE — Progress Notes (Signed)
 Phlebotomy notified that patient is now a lab draw.

## 2023-10-07 ENCOUNTER — Inpatient Hospital Stay (HOSPITAL_COMMUNITY)

## 2023-10-07 DIAGNOSIS — I629 Nontraumatic intracranial hemorrhage, unspecified: Secondary | ICD-10-CM | POA: Diagnosis not present

## 2023-10-07 LAB — CBC
HCT: 29.3 % — ABNORMAL LOW (ref 39.0–52.0)
Hemoglobin: 10.1 g/dL — ABNORMAL LOW (ref 13.0–17.0)
MCH: 30.1 pg (ref 26.0–34.0)
MCHC: 34.5 g/dL (ref 30.0–36.0)
MCV: 87.2 fL (ref 80.0–100.0)
Platelets: 192 10*3/uL (ref 150–400)
RBC: 3.36 MIL/uL — ABNORMAL LOW (ref 4.22–5.81)
RDW: 13.1 % (ref 11.5–15.5)
WBC: 10.9 10*3/uL — ABNORMAL HIGH (ref 4.0–10.5)
nRBC: 0 % (ref 0.0–0.2)

## 2023-10-07 LAB — BASIC METABOLIC PANEL WITH GFR
Anion gap: 5 (ref 5–15)
BUN: 19 mg/dL (ref 8–23)
CO2: 25 mmol/L (ref 22–32)
Calcium: 8.3 mg/dL — ABNORMAL LOW (ref 8.9–10.3)
Chloride: 101 mmol/L (ref 98–111)
Creatinine, Ser: 0.76 mg/dL (ref 0.61–1.24)
GFR, Estimated: >60 mL/min (ref >60)
Glucose, Bld: 115 mg/dL — ABNORMAL HIGH (ref 70–99)
Potassium: 4.1 mmol/L (ref 3.5–5.1)
Sodium: 131 mmol/L — ABNORMAL LOW (ref 135–145)

## 2023-10-07 LAB — SURGICAL PATHOLOGY

## 2023-10-07 MED ORDER — PANTOPRAZOLE SODIUM 40 MG PO TBEC
40.0000 mg | DELAYED_RELEASE_TABLET | Freq: Every day | ORAL | Status: DC
  Administered 2023-10-07 – 2023-10-09 (×3): 40 mg via ORAL
  Filled 2023-10-07 (×3): qty 1

## 2023-10-07 NOTE — Progress Notes (Addendum)
 Progress Note   Patient: Jesse [name]:[id number] DOB: 1940/08/08 DOA: 09/30/2023     7 DOS: the patient was seen and examined on 10/07/2023   Brief hospital course: 83 year old man with PMH of PAF not on AC, dementia, depression, hearing impairment, prostate ca s/p TURP/ radiation, RLS, hypothyroidism and prior known left frontal meningioma (diagnosed initially in 2016 but refused prior surgical/ radiation interventions)  who presented 5/5 after unwitnessed fall, found lying on his back over his walker with left facial swelling and dislodgement of three teeth. He was confused and perseverating on numbers. Workup found to have enlarging left frontal meningioma exerting mass effect with bilateral subdural hematomas with rightward MLS. Admitted to [facility] with NSGY and PMT consulting. Remained full code and family agreed to surgical intervention.   Underwent craniotomy and resection on 5/8 by Dr. Ellery Guthrie with evacuation of SDH. Pt extubated in PACU and transferred to Neuro ICU for post operative care. PCCM consulted for medical management in ICU.  Patient had revision craniotomy with evacuation of subdural hematoma on 10/04/2023.  Continued ICU level care postprocedure. Patient deemed stable for transfer to Med-Surg unit on 10/07/2023.   Assessment and Plan:  Left frontal meningioma with mass effect with rightward MLS, bilateral SDH s/p left frontal craniotomy with tumor resection and SDH evacuation on 5/8 and revision craniotomy with evacuation of subdural hematoma on 5/9. Encephalopathy/ hx of dementia EEG 5/5 neg for seizures  - Continue postop management per neurosurgery - SBP goal less than 160 -Continue Keppra  for 7 days as recommended by neurosurgery. Will count 7 days post last surgery and end on 5/16. -Decadron  taper. -Keep off Eliquis for 7 days (may be able to reinitiate on 5/16). - Frequent neurochecks - JP drain per neurosurgery - PT/OT  Hyponatremia, mild Baseline sodium appears to  range between 132-138 - Will continue to monitor.   Acute blood loss anemia.  Likely blood loss from surgery. Presented with hemoglobin of 14.5 and this trended down to approximately 10. Patient did not require blood transfusion. He was transfused with 2 units of FFP's. - Will continue to monitor.  PAF Following cervical laminectomy in 2013, treated short term with amiodarone and AC.  Remains in sinus rhythm. - Continuous telemetry - Resume Eliquis 5/16   Hypothyroidism - Home Synthroid    Urinary retention - Wife states that patient has recently struggled with urinary frequency and difficulty emptying bladder with history of partial prostatectomy.  Also with symptoms concerning for overflow incontinence prior to admission. She also reported high-dose Flomax of 0.8 resulted in hypotension in the past -Required 4 In-N-Out caths overnight 5/11. Foley catheter was placed.  Wife says there was difficulty with exertion and patient was reported to have some kind of stenosis (no documentation of this).  Wife requesting urology consult. - Continue Flomax. - Would first attempt voiding trial with PVRs and if patient retaining urine would consult Urology.      Subjective: Patient states he has generalized pain.  He does not have a headache.  Physical Exam: Vitals:   10/07/23 0800 10/07/23 0900 10/07/23 1000 10/07/23 1100  BP: 101/69 108/61 130/66 106/67  Pulse: 77 72 76 75  Resp: 18 18 18 17   Temp: 98.5 F (36.9 C)     TempSrc: Axillary     SpO2: 97% 96% 98% 96%  Weight:      Height:       Physical Exam   General: Alert, oriented X3  Eyes: Pupils equal, reactive  Oral cavity: moist mucous membranes  Head: Surgical wound with sutures in place.  Neck: supple  Chest: clear to auscultation. No crackles, no wheezes  CVS: S1,S2 RRR. No murmurs  Abd: No distention, soft, non-tender. No masses palpable  Extr: No edema   MSK: No joint deformities or swelling  Neurological: Some  left sided weakness   Data Reviewed:     Latest Ref Rng & Units 10/07/2023    6:22 AM 10/06/2023    6:44 AM 10/05/2023    6:10 AM  CBC  WBC 4.0 - 10.5 K/uL 10.9  7.4  8.9   Hemoglobin 13.0 - 17.0 g/dL 16.1  09.6  9.3   Hematocrit 39.0 - 52.0 % 29.3  30.2  27.0   Platelets 150 - 400 K/uL 192  169  162       Latest Ref Rng & Units 10/07/2023    6:22 AM 10/06/2023    6:44 AM 10/05/2023    6:10 AM  BMP  Glucose 70 - 99 mg/dL 045  409  811   BUN 8 - 23 mg/dL 19  19  19    Creatinine 0.61 - 1.24 mg/dL 9.14  7.82  9.56   Sodium 135 - 145 mmol/L 131  132  134   Potassium 3.5 - 5.1 mmol/L 4.1  4.1  3.9   Chloride 98 - 111 mmol/L 101  100  102   CO2 22 - 32 mmol/L 25  24  24    Calcium  8.9 - 10.3 mg/dL 8.3  8.3  8.2      Family Communication: Spoke with wife at bedside  Disposition: Status is: Inpatient  Planned Discharge Destination: Rehab DVT PPX: SCDs    Time spent: 50 minutes  Author: MDALA-GAUSI, Zade Falkner AGATHA, MD 10/07/2023 1:17 PM  For on call review [email or web address].

## 2023-10-07 NOTE — Progress Notes (Signed)
 Patient ID: Jesse [name], male   DOB: 01/18/1941, 83 y.o.   MRN: [id number] Marked improvement. JP drain is out.  Level of consciousness is improving. Slight right side weakness. Will obtain new CT today for follow up to check degree of shift.

## 2023-10-07 NOTE — [place] FL2 (Signed)
 [place]  MEDICAID FL2 LEVEL OF CARE FORM     IDENTIFICATION  Patient Name: Jesse [name] Birthdate: March 03, 1941 Sex: male Admission Date (Current Location): 09/30/2023  [facility] and [place] Number:  Producer, television/film/video and Address:  [facility] [place], [address], [place], [place] [address]      Provider Number: [id number]  Attending Physician Name and Address:  Mdala-Gausi, Masiku Agat*  Relative Name and Phone Number:       Current Level of Care: Hospital Recommended Level of Care: Skilled Nursing Facility Prior Approval Number:    Date Approved/Denied:   PASRR Number: [id number]  Discharge Plan:      Current Diagnoses: Patient Active Problem List   Diagnosis Date Noted   Intracranial bleed (HCC) 09/30/2023   Brain mass 09/30/2023   Acute encephalopathy 09/30/2023   Hyponatremia 09/30/2023   Hypokalemia 09/30/2023   Hypocalcemia 09/30/2023   Wound drainage 09/10/2022   Vitamin B12 deficiency 08/26/2020   Restless legs 07/22/2019   History of skin cancer 01/12/2019   Erectile dysfunction 01/12/2019   Anxiety 01/12/2019   Volvulus of sigmoid colon s/p flex sig/rectal tube 09/27/2017 09/27/2017   Mild dementia (HCC) 09/27/2017   Chronic constipation 09/27/2017   Osteoarthritis of right hip 06/05/2016   Prostate cancer (HCC) 12/07/2015   Paroxysmal atrial fibrillation (HCC) 12/07/2015   Hypothyroidism 12/07/2015    Orientation RESPIRATION BLADDER Height & Weight        Normal Continent Weight: 164 lb 10.9 oz (74.7 kg) Height:  5\' 11"  (180.3 cm)  BEHAVIORAL SYMPTOMS/MOOD NEUROLOGICAL BOWEL NUTRITION STATUS      Continent Diet (See discharge summary)  AMBULATORY STATUS COMMUNICATION OF NEEDS Skin   Limited Assist   Normal, Surgical wounds                       Personal Care Assistance Level of Assistance  Bathing, Feeding, Dressing Bathing Assistance: Limited assistance Feeding assistance: Limited assistance Dressing Assistance:  Limited assistance     Functional Limitations Info  Sight, Hearing, Speech Sight Info: Impaired (Glasses) Hearing Info: Adequate Speech Info: Adequate    SPECIAL CARE FACTORS FREQUENCY  OT (By licensed OT), PT (By licensed PT)     PT Frequency: 5x weekly OT Frequency: 5x weekly            Contractures Contractures Info: Not present    Additional Factors Info  Code Status, Allergies Code Status Info: Full Code Allergies Info: No known allergies           Current Medications (10/07/2023):  This is the current hospital active medication list Current Facility-Administered Medications  Medication Dose Route Frequency Provider Last Rate Last Admin   0.9 %  sodium chloride  infusion (Manually program via Guardrails IV Fluids)   Intravenous Once Elna Haggis, MD       acetaminophen  (TYLENOL ) tablet 650 mg  650 mg Oral Q6H PRN Elna Haggis, MD   650 mg at 10/06/23 1629   bisacodyl  (DULCOLAX) suppository 10 mg  10 mg Rectal Daily PRN Elna Haggis, MD   10 mg at 10/06/23 1500   ceFAZolin  (ANCEF ) IVPB 2g/100 mL premix  2 g Intravenous Q8H Elsner, Henry, MD 200 mL/hr at 10/07/23 0918 2 g at 10/07/23 5621   Chlorhexidine  Gluconate Cloth 2 % PADS 6 each  6 each Topical Daily Elna Haggis, MD   6 each at 10/06/23 0919   dexamethasone  (DECADRON ) injection 2 mg  2 mg Intravenous Q12H Elna Haggis,  MD   2 mg at 10/07/23 0911   docusate sodium  (COLACE) capsule 100 mg  100 mg Oral BID Elna Haggis, MD   100 mg at 10/07/23 4098   labetalol  (NORMODYNE ) injection 10-40 mg  10-40 mg Intravenous Q10 min PRN Elna Haggis, MD   40 mg at 10/04/23 2209   levETIRAcetam  (KEPPRA ) tablet 500 mg  500 mg Oral BID Zhou, Jenny Z, RPH   500 mg at 10/07/23 0911   levothyroxine  (SYNTHROID ) tablet 50 mcg  50 mcg Oral Q0600 Elna Haggis, MD   50 mcg at 10/07/23 0607   melatonin tablet 5 mg  5 mg Oral QHS PRN Elna Haggis, MD       oxyCODONE  (Oxy IR/ROXICODONE ) immediate release tablet 5 mg  5 mg Oral Q6H  PRN Deneise Finlay D, NP       pantoprazole  (PROTONIX ) EC tablet 40 mg  40 mg Oral QHS Mdala-Gausi, Masiku Agatha, MD       polyethylene glycol (MIRALAX  / GLYCOLAX ) packet 17 g  17 g Oral Daily PRN Elna Haggis, MD   17 g at 10/06/23 1323   prochlorperazine  (COMPAZINE ) injection 5 mg  5 mg Intravenous Q6H PRN Elna Haggis, MD       senna (SENOKOT) tablet 8.6 mg  1 tablet Oral BID Elna Haggis, MD   8.6 mg at 10/06/23 2112   sodium phosphate  (FLEET) enema 1 enema  1 enema Rectal Once PRN Elna Haggis, MD       tamsulosin (FLOMAX) capsule 0.4 mg  0.4 mg Oral QPC breakfast Deneise Finlay D, NP   0.4 mg at 10/07/23 0911     Discharge Medications: Please see discharge summary for a list of discharge medications.  Relevant Imaging Results:  Relevant Lab Results:   Additional Information SSN: [id number]  Dexter Forest, LCSW

## 2023-10-07 NOTE — TOC Initial Note (Signed)
 Transition of Care [facility]) - Initial/Assessment Note    Patient Details  Name: ADIB KUNZER MRN: [id number] Date of Birth: [birth year]  [facility]) CM/SW Contact:    Dexter Forest, LCSW Phone Number: 10/07/2023, 10:05 AM  Clinical Narrative:                 CSW spoke with patient's wife [name] regarding SNF recommendation. [name] agreeable for CSW to initiate SNF workup but would like to pursue CIR if possible. CSW will complete FL2 and fax patient's clinicals out for review to obtain bed offers.  Expected Discharge Plan: Skilled Nursing Facility Barriers to Discharge: English as a second language teacher, SNF Pending bed offer, Continued Medical Work up   Patient Goals and CMS Choice            Expected Discharge Plan and Services                                              Prior Living Arrangements/Services     Patient language and need for interpreter reviewed:: Yes        Need for Family Participation in Patient Care: Yes (Comment) Care giver support system in place?: Yes (comment)   Criminal Activity/Legal Involvement Pertinent to Current Situation/Hospitalization: No - Comment as needed  Activities of Daily Living      Permission Sought/Granted                  Emotional Assessment Appearance:: Appears stated age     Orientation: : Oriented to Self, Oriented to Place, Oriented to  Time, Oriented to Situation Alcohol / Substance Use: Not Applicable Psych Involvement: No (comment)  Admission diagnosis:  Intracranial bleed (HCC) [I62.9] Traumatic intracranial hemorrhage with unknown loss of consciousness status, initial encounter (HCC) [S06.2XAA] Patient Active Problem List   Diagnosis Date Noted   Intracranial bleed (HCC) 09/30/2023   Brain mass 09/30/2023   Acute encephalopathy 09/30/2023   Hyponatremia 09/30/2023   Hypokalemia 09/30/2023   Hypocalcemia 09/30/2023   Wound drainage 09/10/2022   Vitamin B12 deficiency 08/26/2020    Restless legs 07/22/2019   History of skin cancer 01/12/2019   Erectile dysfunction 01/12/2019   Anxiety 01/12/2019   Volvulus of sigmoid colon s/p flex sig/rectal tube 09/27/2017 09/27/2017   Mild dementia (HCC) 09/27/2017   Chronic constipation 09/27/2017   Osteoarthritis of right hip 06/05/2016   Prostate cancer (HCC) 12/07/2015   Paroxysmal atrial fibrillation (HCC) 12/07/2015   Hypothyroidism 12/07/2015   PCP:  Rodney Clamp, MD Pharmacy:   [facility] - [place], [place] - [facility] AT Portal to Registered [address] [place] [address] Phone: [phone] Fax: [phone]  [facility] [address] [place] - [address] [place] [address] Phone: [phone] Fax: [phone]     Social Drivers of Health (SDOH) Social History: SDOH Screenings   Food Insecurity: No Food Insecurity (10/03/2023)  Housing: Low Risk  (10/03/2023)  Transportation Needs: No Transportation Needs (10/03/2023)  Utilities: Not At Risk (10/03/2023)  Depression (PHQ2-9): Low Risk  (08/26/2020)  Financial Resource Strain: Low Risk  (08/14/2022)   Received from [facility]  Physical Activity: Sufficiently Active (03/12/2022)   Received from [facility]  Social Connections: Moderately Isolated (10/03/2023)  Stress: No Stress Concern Present (05/17/2022)   [facility]  Tobacco Use: Medium Risk (10/03/2023)   SDOH Interventions:     Readmission Risk Interventions     No data to display

## 2023-10-07 NOTE — Progress Notes (Signed)
 Daily Progress Note   Patient Name: Jesse [name]       Date: 10/07/2023 DOB: 03/08/1941  Age: 83 y.o. MRN#: [id number] Attending Physician: Lorne Rosebush Agat* Primary Care Physician: Rodney Clamp, MD Admit Date: 09/30/2023  Reason for Consultation/Follow-up: Establishing goals of care  Subjective: ***  Length of Stay: 7  Current Medications: Scheduled Meds:   sodium chloride    Intravenous Once   Chlorhexidine  Gluconate Cloth  6 each Topical Daily   dexamethasone  (DECADRON ) injection  2 mg Intravenous Q12H   docusate sodium   100 mg Oral BID   levETIRAcetam   500 mg Oral BID   levothyroxine   50 mcg Oral Q0600   pantoprazole   40 mg Oral QHS   senna  1 tablet Oral BID   tamsulosin  0.4 mg Oral QPC breakfast    Continuous Infusions:   ceFAZolin  (ANCEF ) IV 2 g (10/07/23 0918)    PRN Meds: acetaminophen  **OR** [DISCONTINUED] acetaminophen , bisacodyl , labetalol , melatonin, oxyCODONE , polyethylene glycol, prochlorperazine , sodium phosphate   Physical Exam          Vital Signs: BP (!) 123/94   Pulse 68   Temp 98.5 F (36.9 C) (Axillary)   Resp 16   Ht 5\' 11"  (1.803 m)   Wt 74.7 kg   SpO2 98%   BMI 22.97 kg/m  SpO2: SpO2: 98 % O2 Device: O2 Device: Room Air O2 Flow Rate: O2 Flow Rate (L/min): 6 L/min  Intake/output summary:  Intake/Output Summary (Last 24 hours) at 10/07/2023 1010 Last data filed at 10/07/2023 0700 Gross per 24 hour  Intake 200 ml  Output 2780 ml  Net -2580 ml   LBM: Last BM Date : 10/06/23 Baseline Weight: Weight: 74.7 kg Most recent weight: Weight: 74.7 kg       Palliative Assessment/Data:      Patient Active Problem List   Diagnosis Date Noted   Intracranial bleed (HCC) 09/30/2023   Brain mass 09/30/2023   Acute encephalopathy  09/30/2023   Hyponatremia 09/30/2023   Hypokalemia 09/30/2023   Hypocalcemia 09/30/2023   Wound drainage 09/10/2022   Vitamin B12 deficiency 08/26/2020   Restless legs 07/22/2019   History of skin cancer 01/12/2019   Erectile dysfunction 01/12/2019   Anxiety 01/12/2019   Volvulus of sigmoid colon s/p flex sig/rectal tube 09/27/2017 09/27/2017   Mild dementia (HCC)  09/27/2017   Chronic constipation 09/27/2017   Osteoarthritis of right hip 06/05/2016   Prostate cancer (HCC) 12/07/2015   Paroxysmal atrial fibrillation (HCC) 12/07/2015   Hypothyroidism 12/07/2015    Palliative Care Assessment & Plan   Patient Profile:  83 y.o. male   admitted on 09/30/2023   after having a fall and having a substantial change in mental status.     Patient has refused surgical intervention for previously known meningioma. Admitted for large left frontal meningioma with mass effect, bilateral subdural hematoma. Neurosurgery/Dr. Ellery Guthrie consulted and plans surgical intervention on 5/8.  83 y.o. male   admitted on 09/30/2023   after having a fall and having a substantial change in mental status.     Patient has refused surgical intervention for previously known meningioma. Admitted for large left frontal meningioma with mass effect, bilateral subdural hematoma. Neurosurgery/Dr. Ellery Guthrie consulted and plans surgical intervention on 5/8.    with patient's wife [facility] regarding SNF recommendation. [name] agreeable for CSW to initiate SNF workup but would like to pursue CIR if possible. CSW will complete FL2 and fax patient's clinicals out for review to obtain bed offers.     Patient and family face ongoing treatment option decisions, advanced directive decisions and anticipatory care needs.         Assessment: ***  Recommendations/Plan: ***  Goals of Care and Additional Recommendations: Limitations on Scope of Treatment: Full Scope Treatment  Code Status:    Code Status Orders  (From admission,  onward)           Start     Ordered   10/04/23 1245  Full code  Continuous       Question:  By:  Answer:  Consent: discussion documented in EHR   10/04/23 1244           Code Status History     Date Active Date Inactive Code Status Order ID Comments User Context   10/03/2023 1247 10/04/2023 1244 Full Code [id number]  Elna Haggis, MD Inpatient   09/30/2023 1138 10/03/2023 1247 Full Code [id number]  Lena Qualia, MD ED   09/27/2017 1409 10/01/2017 1346 Full Code [id number]  Ronny Colas, MD ED   06/13/2017 2159 06/14/2017 2321 Full Code [id number]  Ward, Veto Gowda, PA-C ED   07/04/2016 1959 07/06/2016 1453 Full Code [id number]  Adonica Hoose, MD Inpatient       Prognosis:  Unable to determine  Discharge Planning: Wife is hopeful for CIR  Care plan was discussed with Dr Ellery Guthrie and bedside RN  Thank you for allowing the Palliative Medicine Team to assist in the care of this patient.  Thena Fireman, NP  Please contact Palliative Medicine Team phone at [phone] for questions and concerns.

## 2023-10-08 LAB — CBC
HCT: 29.1 % — ABNORMAL LOW (ref 39.0–52.0)
Hemoglobin: 10.2 g/dL — ABNORMAL LOW (ref 13.0–17.0)
MCH: 30.4 pg (ref 26.0–34.0)
MCHC: 35.1 g/dL (ref 30.0–36.0)
MCV: 86.6 fL (ref 80.0–100.0)
Platelets: 198 10*3/uL (ref 150–400)
RBC: 3.36 MIL/uL — ABNORMAL LOW (ref 4.22–5.81)
RDW: 13.2 % (ref 11.5–15.5)
WBC: 9.6 10*3/uL (ref 4.0–10.5)
nRBC: 0 % (ref 0.0–0.2)

## 2023-10-08 LAB — BASIC METABOLIC PANEL WITH GFR
Anion gap: 8 (ref 5–15)
BUN: 19 mg/dL (ref 8–23)
CO2: 22 mmol/L (ref 22–32)
Calcium: 8.4 mg/dL — ABNORMAL LOW (ref 8.9–10.3)
Chloride: 100 mmol/L (ref 98–111)
Creatinine, Ser: 0.73 mg/dL (ref 0.61–1.24)
GFR, Estimated: >60 mL/min (ref >60)
Glucose, Bld: 117 mg/dL — ABNORMAL HIGH (ref 70–99)
Potassium: 4.1 mmol/L (ref 3.5–5.1)
Sodium: 130 mmol/L — ABNORMAL LOW (ref 135–145)

## 2023-10-08 MED ORDER — FINASTERIDE 5 MG PO TABS
5.0000 mg | ORAL_TABLET | Freq: Every day | ORAL | Status: DC
  Administered 2023-10-08 – 2023-10-10 (×3): 5 mg via ORAL
  Filled 2023-10-08 (×3): qty 1

## 2023-10-08 NOTE — Progress Notes (Signed)
 Patient ID: Jesse [name], male   DOB: 09-14-1940, 83 y.o.   MRN: [id number] Vital signs are stable Patient is awake and alert and more conversant today than he was yesterday Incisions clean and dry. Postoperative CAT scan shows decreased air decrease shift increased edema also was noted and brain appears to be improving. Patient is excellent candidate for comprehensive inpatient rehabilitation.  Agree with voiding trials and urology consultation as patient has had significant frequency prior to his illness.

## 2023-10-08 NOTE — Progress Notes (Signed)
 Patient wife at bedside. Wife currently requesting MD discontinue Flomax. Wife educated on medication rationale-improves flow of urine, relieves urinary retention.  Upon further assessment, Wife feels flomax contributes to pt having low BP. RN did inform wife of pt morning and afternoon BP of 117/72 and 110/69 and that his BP isn't concerning at this time. RN informed MD of wifes request.

## 2023-10-08 NOTE — Progress Notes (Signed)
 Inpatient Rehab Coordinator Note:  I met with patient and his spouse at bedside to discuss CIR recommendations and goals/expectations of CIR stay.  We reviewed 3 hrs/day of therapy, physician follow up, and average length of stay 2 weeks (dependent upon progress) with goals of supervision.  We discussed home set up and Medicare benefits.  Spouse has questions regarding foley/voiding and note Dr. Haywood Lisle planning voiding trial today and discuss with urology if unable to void.  We will follow for potential admission pending bed availability.    Loye Rumble, PT, DPT Admissions Coordinator [phone] 10/08/23  1:17 PM

## 2023-10-08 NOTE — TOC Progression Note (Signed)
 Transition of Care [facility] - Progression Note    Patient Details  Name: Jesse [name] MRN: [id number] Date of Birth: 10-Aug-1940  Transition [facility] Care [facility]) CM/SW Contact  Tandy Fam, [place] Phone Number: 10/08/2023, 11:01 AM  Clinical Narrative:   CSW sent message to [facility] admissions coordinator and PT and OT assigned to patient today to review for possible CIR placement, per spouse request. Awaiting review. CSW to follow.    Expected Discharge Plan: IP Rehab Facility Barriers to Discharge: Insurance Authorization, SNF Pending bed offer, Continued Medical Work up  Expected Discharge Plan and Services                                               Social Determinants of Health (SDOH) Interventions SDOH Screenings   Food Insecurity: No Food Insecurity (10/03/2023)  Housing: Low Risk  (10/03/2023)  Transportation Needs: No Transportation Needs (10/03/2023)  Utilities: Not At Risk (10/03/2023)  Depression (PHQ2-9): Low Risk  (08/26/2020)  Financial Resource Strain: Low Risk  (08/14/2022)   Received from [facility], [facility]  Physical Activity: Sufficiently Active (03/12/2022)   Received from [facility], [facility]  Social Connections: Moderately Isolated (10/03/2023)  Stress: No Stress Concern Present (05/17/2022)   Received from [facility], [facility]  Tobacco Use: Medium Risk (10/03/2023)    Readmission Risk Interventions     No data to display

## 2023-10-08 NOTE — Progress Notes (Signed)
 This chaplain attempted F/U spiritual care and support with the Pt. wife-[name]. [name] is not at the bedside at the time of the visit and the Pt. is sleeping.  This chaplain will attempt a revisit.  Chaplain Kathleene Papas [phone]

## 2023-10-08 NOTE — TOC Progression Note (Signed)
 Transition of Care [facility]) - Progression Note    Patient Details  Name: Jesse [name] MRN: [id number] Date of Birth: 1940-12-13  Transition of Care [facility]) CM/SW Contact  Tandy Fam, [place] Phone Number: 10/08/2023, 2:42 PM  Clinical Narrative:   CSW updated by Rehab Admissions that patient is a candidate, but unsure about bed availability. CSW spoke with patient's spouse, Jesse [name], to discuss sending referral to other AIR to check on bed availability. Spouse not interested in looking at other AIR, wants to wait for a bed at CIR. CSW to follow.    Expected Discharge Plan: IP Rehab Facility Barriers to Discharge: Continued Medical Work up  Expected Discharge Plan and Services                                               Social Determinants of Health (SDOH) Interventions SDOH Screenings   Food Insecurity: No Food Insecurity (10/03/2023)  Housing: Low Risk  (10/03/2023)  Transportation Needs: No Transportation Needs (10/03/2023)  Utilities: Not At Risk (10/03/2023)  Depression (PHQ2-9): Low Risk  (08/26/2020)  Financial Resource Strain: Low Risk  (08/14/2022)   Received from [facility], [facility]  Physical Activity: Sufficiently Active (03/12/2022)   Received from [facility], [facility]  Social Connections: Moderately Isolated (10/03/2023)  Stress: No Stress Concern Present (05/17/2022)   Received from [facility], [facility]  Tobacco Use: Medium Risk (10/03/2023)    Readmission Risk Interventions     No data to display

## 2023-10-08 NOTE — Progress Notes (Addendum)
 TRH ROUNDING NOTE LY STENCIL BJY:[id number]  DOB: 1941-05-08  DOA: 09/30/2023  PCP: Rodney Clamp, MD  10/08/2023,7:13 AM  LOS: 8 days    Code Status: full   From:  home  Current Dispo: Unclear at this time   82 year old white male Depression Hip arthritis Previous sigmoid volvulus Remote history atrial fibrillation 2013 hypothyroid Previous diagnosed 2016 meningioma 4.1 cm previously declined surgery Prostate cancer RLS  Came to emergency room 5/5 unwitnessed fall facial swelling dislodgment of several teeth with confusion and enlarging left frontal meningioma with bilateral subdural hematomas underwent craniotomy resection 10/03/2023 was on ventilator  5/5 admitted  5/8 craniotomy and tumor resection/ SDH evacuation 5/9 Less responsive -CT showing re-accumulation of SDH with 14mm MLS taken to OR for revision of crani and evacuation of SDH, extubated post op.  EBL , given albumin, s/p 2u FFP, 1.2L LR in OR 5/10 no issues overnight  5/11 urinary retention overnight now s/p 4 in and out caths, placed indwelling Foley this a.m.   Plan  Meningioma subdural hematomas with resection 10/03/2023 and reaccumulation of SDH Seems to have surgically stabilized-neurosurgery following, last CT scan 5/12 shows expected evolution of postop recent left frontal mass resection with slight mass effect left lateral ventricle Defer to neurosurgery planning about Ancef  as he had an intraventricular catheter/JP drain in place previously Continue Decadron  2 mg every 12 Keppra  500 twice daily as per neurosurgery Systolic blood pressure goal less than 160 Pain seems to be controlled he is on Tylenol  and Oxy IR 5 every 6 as needed for severe pain  Paroxysmal A-fib in 2013 (after laminectomy) previously treated with amiodarone Overall seems to be sinus rhythm-Eliquis can be resumed apparently on 5/16  Prostate cancer 2016 prostatectomy-and XRT in 2017 Previously on high-dose Flomax with  hypotension-currently on 0.4 Flomax Will add finasteride 5 mg Voiding trial to be performed today and will need outpatient referral to urology and discharged with catheter if needed  Hypothyroidism Continue Synthroid  50  Anemia of blood loss Previous hemoglobins in the 13-14 range Given FFP this hospital stay because of surgery Monitor trends transfusion threshold is below 7  No family present bedside Called wife on phone [phone]  DVT prophylaxis: scd  Status is: Inpatient Remains inpatient appropriate because:   Await therapy eval  Subjective:  Awake coherent in nad Seems coherent somewhat  Objective + exam Vitals:   10/08/23 0200 10/08/23 0300 10/08/23 0400 10/08/23 0442  BP: 118/76 118/69 123/76 115/82  Pulse: 70 69 (!) 59 71  Resp: 16 14 (!) 30   Temp:   98.3 F (36.8 C) 98.3 F (36.8 C)  TempSrc:   Oral Oral  SpO2: 95% 96% 95% 98%  Weight:      Height:       Filed Weights   09/30/23 2000  Weight: 74.7 kg    Examination: Eomi ncat no focal defciict, post op changes to the head S1 s 2no m/r/g Abd soft nt nd no rebound no gaurding Power 5/5 Reflexes 2/3  Sensory intact  Data Reviewed: reviewed   CBC    Component Value Date/Time   WBC 9.6 10/08/2023 0559   RBC 3.36 (L) 10/08/2023 0559   HGB 10.2 (L) 10/08/2023 0559   HCT 29.1 (L) 10/08/2023 0559   PLT 198 10/08/2023 0559   MCV 86.6 10/08/2023 0559   MCH 30.4 10/08/2023 0559   MCHC 35.1 10/08/2023 0559   RDW 13.2 10/08/2023 0559   LYMPHSABS 0.5 (L) 12/14/2016 0920  MONOABS 0.6 12/14/2016 0920   EOSABS 0.0 12/14/2016 0920   BASOSABS 0.0 12/14/2016 0920      Latest Ref Rng & Units 10/07/2023    6:22 AM 10/06/2023    6:44 AM 10/05/2023    6:10 AM  CMP  Glucose 70 - 99 mg/dL 660  630  160   BUN 8 - 23 mg/dL 19  19  19    Creatinine 0.61 - 1.24 mg/dL 1.09  3.23  5.57   Sodium 135 - 145 mmol/L 131  132  134   Potassium 3.5 - 5.1 mmol/L 4.1  4.1  3.9   Chloride 98 - 111 mmol/L 101  100  102    CO2 22 - 32 mmol/L 25  24  24    Calcium  8.9 - 10.3 mg/dL 8.3  8.3  8.2     Scheduled Meds:  sodium chloride    Intravenous Once   Chlorhexidine  Gluconate Cloth  6 each Topical Daily   dexamethasone  (DECADRON ) injection  2 mg Intravenous Q12H   docusate sodium   100 mg Oral BID   levETIRAcetam   500 mg Oral BID   levothyroxine   50 mcg Oral Q0600   pantoprazole   40 mg Oral QHS   senna  1 tablet Oral BID   tamsulosin  0.4 mg Oral QPC breakfast   Continuous Infusions:   ceFAZolin  (ANCEF ) IV Stopped (10/08/23 0132)    Time  44  Verlie Glisson, MD  [facility]

## 2023-10-08 NOTE — Plan of Care (Signed)
  Problem: Clinical Measurements: Goal: Will remain free from infection Outcome: Progressing Goal: Respiratory complications will improve Outcome: Progressing Goal: Cardiovascular complication will be avoided Outcome: Progressing   

## 2023-10-08 NOTE — Evaluation (Signed)
 Occupational Therapy Evaluation Patient Details Name: Jesse [name] MRN: [id number] DOB: [birth year] Today's Date: 10/08/2023   History of Present Illness   Patient is 83 y.o. male presenting 5/5 after a fall at home. Work up revealed known L frontal meningioma with increased size since last imaging. S/p craniotomy with resection of tumor and evacuation of SDH on 5/8. Pt with decreased responsiveness and CT showed acute SDH with 14mm shift, returned to OR for L crani revision 5/9. PMH significant of dementia, atrial fibrillation, hypothyroidism, meningioma, prostate cancer.     Clinical Impressions Pt now s/p resection so re-evaluated. Pt with flat affect throughout, but with fair ability to follow one step commands with increased time. Pt needing cues for safety/hand placement with functional mobility, needing min A for balance throughout with R lateral lean esp during prolonged standing. Pt wife present and supportive. Will continue vision assessments as pt with difficulty visual tracking and poor visual attention during testing. Updated recommendation to inpatient rehab >3 hours/day to optimize safety and independence with return to ADL as pt with significant change in functional status with very good support at home from wife present during session.      If plan is discharge home, recommend the following:   A little help with walking and/or transfers;A lot of help with bathing/dressing/bathroom;Assistance with cooking/housework;Direct supervision/assist for financial management;Assistance with feeding;Direct supervision/assist for medications management;Assist for transportation;Help with stairs or ramp for entrance;Supervision due to cognitive status     Functional Status Assessment   Patient has had a recent decline in their functional status and demonstrates the ability to make significant improvements in function in a reasonable and predictable amount of time.     Equipment  Recommendations   Tub/shower seat     Recommendations for Other Services   Rehab consult     Precautions/Restrictions   Precautions Precautions: Fall Recall of Precautions/Restrictions: Impaired Precaution/Restrictions Comments: L crani Restrictions Weight Bearing Restrictions Per Provider Order: No     Mobility Bed Mobility               General bed mobility comments: EOB with PT on arrival    Transfers Overall transfer level: Needs assistance Equipment used: Rolling walker (2 wheels) Transfers: Sit to/from Stand Sit to Stand: Min assist, +2 safety/equipment           General transfer comment: cues for hand placement, light assist for balance on rise      Balance Overall balance assessment: History of Falls, Needs assistance Sitting-balance support: Feet supported, Bilateral upper extremity supported Sitting balance-Leahy Scale: Fair Sitting balance - Comments: statically   Standing balance support: Reliant on assistive device for balance, Bilateral upper extremity supported Standing balance-Leahy Scale: Poor Standing balance comment: dependent on at least one UE for balance statically and RW dynamically as well as external support                           ADL either performed or assessed with clinical judgement   ADL Overall ADL's : Needs assistance/impaired     Grooming: Minimal assistance;Standing Grooming Details (indicate cue type and reason): intermittent min A for balance secondary to R lateral lean                 Toilet Transfer: Minimal assistance;Ambulation;Rolling walker (2 wheels) Toilet Transfer Details (indicate cue type and reason): min A for balance         Functional mobility during ADLs: Minimal assistance;Rolling  walker (2 wheels)       Vision Baseline Vision/History: 1 Wears glasses Patient Visual Report: No change from baseline Vision Assessment?: Vision impaired- to be further tested in  functional context;Yes Tracking/Visual Pursuits: Impaired - to be further tested in functional context (decreased smoothness or hotizontal tracking/poor visual attention) Saccades: Decreased speed of saccadic movement Visual Fields: Impaired-to be further tested in functional context (R inferior initially seems impaired and then seems [facility]; will continue to assess) Diplopia Assessment:  (denies) Depth Perception:  (decreased visual attention during finger to nose thus slightly off while bringing finger to therapist finger bilaterally) Additional Comments: Pt able to locate toothbrush on R and toothpaste on L of sink without significant cueing     Perception         Praxis         Pertinent Vitals/Pain Pain Assessment Pain Assessment: Faces Faces Pain Scale: No hurt Pain Intervention(s): Monitored during session     Extremity/Trunk Assessment Upper Extremity Assessment Upper Extremity Assessment: Generalized weakness;RUE deficits/detail RUE Deficits / Details: 4-/5 as compared to L 4/5   Lower Extremity Assessment Lower Extremity Assessment: Defer to PT evaluation   Cervical / Trunk Assessment Cervical / Trunk Assessment: Kyphotic;Other exceptions Cervical / Trunk Exceptions: L crani, R cervical rotation and flexion, R gaze preference   Communication Communication Communication: Impaired Factors Affecting Communication: Hearing impaired (soft spoken as well)   Cognition Arousal: Alert Behavior During Therapy: Flat affect Cognition: History of cognitive impairments             OT - Cognition Comments: hx of cognitive impairments at baseline-- dx of dementia. Pt oriented with use of compensatory techniques for date                 Following commands: Impaired Following commands impaired: Follows one step commands with increased time     Cueing  General Comments   Cueing Techniques: Verbal cues  VSS on RA. wife present and supportive asking about inpatient  rehabilitation   Exercises     Shoulder Instructions      Home Living Family/patient expects to be discharged to:: Private residence Living Arrangements: Spouse/significant other Available Help at Discharge: Family Type of Home: House Home Access: Level entry     Home Layout: Two level;1/2 bath on main level;Able to live on main level with bedroom/bathroom     Bathroom Shower/Tub: Walk-in shower (upstairs)   Bathroom Toilet: Handicapped height     Home Equipment: Agricultural consultant (2 wheels);BSC/3in1;Wheelchair - manual          Prior Functioning/Environment Prior Level of Function : Needs assist             Mobility Comments: per wife, pt using RW in the home ADLs Comments: per pt report requires assist from spouse for ADLs recently    OT Problem List: Impaired balance (sitting and/or standing);Decreased cognition;Decreased strength;Decreased activity tolerance;Impaired vision/perception;Decreased safety awareness;Decreased knowledge of use of DME or AE   OT Treatment/Interventions: Self-care/ADL training;Patient/family education;Therapeutic exercise;Balance training;Therapeutic activities;DME and/or AE instruction;Visual/perceptual remediation/compensation;Cognitive remediation/compensation      OT Goals(Current goals can be found in the care plan section)   Acute Rehab OT Goals Patient Stated Goal: per wife, get to rehab OT Goal Formulation: With patient/family Time For Goal Achievement: 10/16/23 Potential to Achieve Goals: Good   OT Frequency:  Min 2X/week    Co-evaluation              AM-PAC OT "6 Clicks" Daily Activity  Outcome Measure Help from another person eating meals?: A Little Help from another person taking care of personal grooming?: A Little Help from another person toileting, which includes using toliet, bedpan, or urinal?: A Lot Help from another person bathing (including washing, rinsing, drying)?: A Lot Help from another person  to put on and taking off regular upper body clothing?: A Little Help from another person to put on and taking off regular lower body clothing?: A Lot 6 Click Score: 15   End of Session Equipment Utilized During Treatment: Gait belt;Rolling walker (2 wheels) Nurse Communication: Mobility status  Activity Tolerance: Patient tolerated treatment well Patient left: in chair;with call bell/phone within reach;with chair alarm set;with family/visitor present  OT Visit Diagnosis: Unsteadiness on feet (R26.81);Other abnormalities of gait and mobility (R26.89);Other symptoms and signs involving cognitive function;Muscle weakness (generalized) (M62.81);Low vision, both eyes (H54.2)                Time: 4742-5956 OT Time Calculation (min): 23 min Charges:  OT General Charges $OT Visit: 1 Visit OT Evaluation $OT Re-eval: 1 Re-eval  Karilyn Ouch, OTR/L [facility] Acute Rehabilitation Office: [phone]   Emery Hans 10/08/2023, 2:58 PM

## 2023-10-08 NOTE — Progress Notes (Signed)
 Pt transferred to 3W28.

## 2023-10-08 NOTE — Progress Notes (Signed)
 Physical Therapy Treatment Patient Details Name: Jesse [name] MRN: [id number] DOB: 1940-11-04 Today's Date: 10/08/2023   History of Present Illness Patient is 83 y.o. male presenting 5/5 after a fall at home. Work up revealed known L frontal meningioma with increased size since last imaging. S/p craniotomy with resection of tumor and evacuation of SDH on 5/8. Pt with decreased responsiveness and CT showed acute SDH with 14mm shift, returned to OR for L crani revision 5/9. PMH significant of dementia, atrial fibrillation, hypothyroidism, meningioma, prostate cancer.    PT Comments  Pt tolerated treatment well today. Co-treat with OT. Pt was able to progress ambulation in room today with with RW Min A. DC recs updated to intensive inpatient follow-up therapy, >3 hours/day. PT will continue to follow.      If plan is discharge home, recommend the following: Two people to help with walking and/or transfers;A lot of help with bathing/dressing/bathroom;Assistance with cooking/housework;Assistance with feeding;Direct supervision/assist for medications management;Help with stairs or ramp for entrance;Assist for transportation;Supervision due to cognitive status   Can travel by private vehicle     No  Equipment Recommendations  Wheelchair (measurements PT);Wheelchair cushion (measurements PT);Hospital bed    Recommendations for Other Services Rehab consult     Precautions / Restrictions Precautions Precautions: Fall Recall of Precautions/Restrictions: Impaired Precaution/Restrictions Comments: L crani Restrictions Weight Bearing Restrictions Per Provider Order: No     Mobility  Bed Mobility Overal bed mobility: Needs Assistance Bed Mobility: Supine to Sit     Supine to sit: Supervision     General bed mobility comments: no physical assistance provided.    Transfers Overall transfer level: Needs assistance Equipment used: Rolling walker (2 wheels) Transfers: Sit to/from Stand Sit  to Stand: Min assist, +2 safety/equipment           General transfer comment: cues for hand placement, light assist for balance on rise    Ambulation/Gait Ambulation/Gait assistance: Min assist, +2 safety/equipment Gait Distance (Feet): 20 Feet Assistive device: Rolling walker (2 wheels) Gait Pattern/deviations: Step-to pattern, Decreased stride length Gait velocity: decreased     General Gait Details: Pt able to ambulate in room with RW Min A. +2 for safety. no LOB noted. Able to stand at sink with OT for ~5 minutes.   Stairs             Wheelchair Mobility     Tilt Bed    Modified Rankin (Stroke Patients Only)       Balance Overall balance assessment: History of Falls, Needs assistance Sitting-balance support: Feet supported, Bilateral upper extremity supported Sitting balance-Leahy Scale: Fair Sitting balance - Comments: statically   Standing balance support: Reliant on assistive device for balance, Bilateral upper extremity supported Standing balance-Leahy Scale: Poor Standing balance comment: dependent on at least one UE for balance statically and RW dynamically as well as external support                            Communication Communication Communication: Impaired Factors Affecting Communication: Hearing impaired (soft spoken as well)  Cognition Arousal: Alert Behavior During Therapy: Flat affect                             Following commands: Impaired Following commands impaired: Follows one step commands with increased time    Cueing Cueing Techniques: Verbal cues  Exercises      General Comments General comments (skin  integrity, edema, etc.): VSS      Pertinent Vitals/Pain Pain Assessment Pain Assessment: Faces Faces Pain Scale: No hurt    Home Living Family/patient expects to be discharged to:: Private residence Living Arrangements: Spouse/significant other Available Help at Discharge: Family Type of Home:  House Home Access: Level entry       Home Layout: Two level;1/2 bath on main level;Able to live on main level with bedroom/bathroom Home Equipment: Rolling Walker (2 wheels);BSC/3in1;Wheelchair - manual      Prior Function            PT Goals (current goals can now be found in the care plan section) Progress towards PT goals: Progressing toward goals    Frequency    Min 2X/week      PT Plan      Co-evaluation              AM-PAC PT "6 Clicks" Mobility   Outcome Measure  Help needed turning from your back to your side while in a flat bed without using bedrails?: A Lot Help needed moving from lying on your back to sitting on the side of a flat bed without using bedrails?: A Lot Help needed moving to and from a bed to a chair (including a wheelchair)?: A Lot Help needed standing up from a chair using your arms (e.g., wheelchair or bedside chair)?: A Lot Help needed to walk in hospital room?: Total Help needed climbing 3-5 steps with a railing? : Total 6 Click Score: 10    End of Session Equipment Utilized During Treatment: Gait belt Activity Tolerance: Patient tolerated treatment well Patient left: in chair;with call bell/phone within reach;with chair alarm set;with family/visitor present Nurse Communication: Mobility status PT Visit Diagnosis: Unsteadiness on feet (R26.81);Muscle weakness (generalized) (M62.81);Other abnormalities of gait and mobility (R26.89);History of falling (Z91.81);Difficulty in walking, not elsewhere classified (R26.2)     Time: 4098-1191 PT Time Calculation (min) (ACUTE ONLY): 25 min  Charges:    $Gait Training: 8-22 mins PT General Charges $$ ACUTE PT VISIT: 1 Visit                     Sibyl Mikula B, PT, DPT Acute Rehab Services [phone]    Hareem Surowiec 10/08/2023, 4:10 PM

## 2023-10-09 ENCOUNTER — Inpatient Hospital Stay (HOSPITAL_COMMUNITY)

## 2023-10-09 DIAGNOSIS — R55 Syncope and collapse: Secondary | ICD-10-CM | POA: Diagnosis not present

## 2023-10-09 DIAGNOSIS — G9340 Encephalopathy, unspecified: Secondary | ICD-10-CM | POA: Diagnosis not present

## 2023-10-09 DIAGNOSIS — R4701 Aphasia: Secondary | ICD-10-CM | POA: Diagnosis not present

## 2023-10-09 DIAGNOSIS — I629 Nontraumatic intracranial hemorrhage, unspecified: Secondary | ICD-10-CM | POA: Diagnosis not present

## 2023-10-09 DIAGNOSIS — R4182 Altered mental status, unspecified: Secondary | ICD-10-CM

## 2023-10-09 DIAGNOSIS — R569 Unspecified convulsions: Secondary | ICD-10-CM | POA: Diagnosis not present

## 2023-10-09 DIAGNOSIS — R471 Dysarthria and anarthria: Secondary | ICD-10-CM | POA: Diagnosis not present

## 2023-10-09 LAB — CBC WITH DIFFERENTIAL/PLATELET
Abs Immature Granulocytes: 0.22 10*3/uL — ABNORMAL HIGH (ref 0.00–0.07)
Basophils Absolute: 0 10*3/uL (ref 0.0–0.1)
Basophils Relative: 0 %
Eosinophils Absolute: 0 10*3/uL (ref 0.0–0.5)
Eosinophils Relative: 0 %
HCT: 30.7 % — ABNORMAL LOW (ref 39.0–52.0)
Hemoglobin: 10.7 g/dL — ABNORMAL LOW (ref 13.0–17.0)
Immature Granulocytes: 3 %
Lymphocytes Relative: 9 %
Lymphs Abs: 0.8 10*3/uL (ref 0.7–4.0)
MCH: 30.4 pg (ref 26.0–34.0)
MCHC: 34.9 g/dL (ref 30.0–36.0)
MCV: 87.2 fL (ref 80.0–100.0)
Monocytes Absolute: 0.9 10*3/uL (ref 0.1–1.0)
Monocytes Relative: 10 %
Neutro Abs: 6.7 10*3/uL (ref 1.7–7.7)
Neutrophils Relative %: 78 %
Platelets: 221 10*3/uL (ref 150–400)
RBC: 3.52 MIL/uL — ABNORMAL LOW (ref 4.22–5.81)
RDW: 13.4 % (ref 11.5–15.5)
WBC: 8.6 10*3/uL (ref 4.0–10.5)
nRBC: 0 % (ref 0.0–0.2)

## 2023-10-09 LAB — BASIC METABOLIC PANEL WITH GFR
Anion gap: 9 (ref 5–15)
BUN: 20 mg/dL (ref 8–23)
CO2: 24 mmol/L (ref 22–32)
Calcium: 8.5 mg/dL — ABNORMAL LOW (ref 8.9–10.3)
Chloride: 99 mmol/L (ref 98–111)
Creatinine, Ser: 0.81 mg/dL (ref 0.61–1.24)
GFR, Estimated: >60 mL/min (ref >60)
Glucose, Bld: 118 mg/dL — ABNORMAL HIGH (ref 70–99)
Potassium: 4.2 mmol/L (ref 3.5–5.1)
Sodium: 132 mmol/L — ABNORMAL LOW (ref 135–145)

## 2023-10-09 LAB — GLUCOSE, CAPILLARY: Glucose-Capillary: 141 mg/dL — ABNORMAL HIGH (ref 70–99)

## 2023-10-09 MED ORDER — TAMSULOSIN HCL 0.4 MG PO CAPS: 0.4000 mg | ORAL_CAPSULE | Freq: Every day | ORAL | Status: DC

## 2023-10-09 MED ORDER — LACTATED RINGERS IV BOLUS
500.0000 mL | Freq: Once | INTRAVENOUS | Status: AC
  Administered 2023-10-09: 500 mL via INTRAVENOUS

## 2023-10-09 MED ORDER — GADOBUTROL 1 MMOL/ML IV SOLN
7.5000 mL | Freq: Once | INTRAVENOUS | Status: AC | PRN
  Administered 2023-10-09: 7.5 mL via INTRAVENOUS

## 2023-10-09 MED ORDER — SENNA 8.6 MG PO TABS: 1.0000 | ORAL_TABLET | Freq: Two times a day (BID) | ORAL | 0 refills | Status: DC

## 2023-10-09 MED ORDER — PANTOPRAZOLE SODIUM 40 MG PO TBEC: 40.0000 mg | DELAYED_RELEASE_TABLET | Freq: Every day | ORAL | Status: DC

## 2023-10-09 MED ORDER — IOHEXOL 350 MG/ML SOLN
75.0000 mL | Freq: Once | INTRAVENOUS | Status: AC | PRN
  Administered 2023-10-09: 75 mL via INTRAVENOUS

## 2023-10-09 MED ORDER — LEVETIRACETAM 500 MG PO TABS: 500.0000 mg | ORAL_TABLET | Freq: Two times a day (BID) | ORAL | Status: DC

## 2023-10-09 MED ORDER — OXYCODONE HCL 5 MG PO TABS: 5.0000 mg | ORAL_TABLET | Freq: Four times a day (QID) | ORAL | 0 refills | Status: DC | PRN

## 2023-10-09 MED ORDER — FINASTERIDE 5 MG PO TABS: 5.0000 mg | ORAL_TABLET | Freq: Every day | ORAL | Status: DC

## 2023-10-09 MED ORDER — MELATONIN 5 MG PO TABS: 5.0000 mg | ORAL_TABLET | Freq: Every evening | ORAL | Status: DC | PRN

## 2023-10-09 MED ORDER — SODIUM CHLORIDE 0.9% FLUSH
3.0000 mL | Freq: Two times a day (BID) | INTRAVENOUS | Status: DC
  Administered 2023-10-09 – 2023-10-10 (×3): 10 mL via INTRAVENOUS

## 2023-10-09 MED ORDER — DOCUSATE SODIUM 100 MG PO CAPS: 100.0000 mg | ORAL_CAPSULE | Freq: Two times a day (BID) | ORAL | Status: DC

## 2023-10-09 MED ORDER — CEFAZOLIN SODIUM-DEXTROSE 2-4 GM/100ML-% IV SOLN: 2.0000 g | Freq: Three times a day (TID) | INTRAVENOUS | Status: DC

## 2023-10-09 MED ORDER — ACETAMINOPHEN 325 MG PO TABS: 650.0000 mg | ORAL_TABLET | Freq: Four times a day (QID) | ORAL | Status: DC | PRN

## 2023-10-09 MED ORDER — SODIUM CHLORIDE 0.9% FLUSH: 3.0000 mL | INTRAVENOUS | Status: DC | PRN

## 2023-10-09 MED ORDER — POLYETHYLENE GLYCOL 3350 17 G PO PACK: 17.0000 g | PACK | Freq: Every day | ORAL | Status: DC | PRN

## 2023-10-09 NOTE — Progress Notes (Signed)
 RN notified to come to bedside by physical therapist. Patient found laying in supine position with word finding difficulties and slurred speech. Per wife pt had bowel movement and was assisted to chair. Shortly after pt sat in chair he became less responsive. Physical therapist noted BP of 83/68 prior to getting patient back to bed. Patient had increasing difficulty with orientation questions, but was initially alert and oriented x4 on morning assessment. BP recovered in supine position. Code stroke initiated by RN.

## 2023-10-09 NOTE — Progress Notes (Signed)
 Physical Therapy Treatment Patient Details Name: Jesse [name] MRN: [id number] DOB: 09-25-1940 Today's Date: 10/09/2023   History of Present Illness Patient is 83 y.o. male presenting 5/5 after a fall at home. Work up revealed known L frontal meningioma with increased size since last imaging. S/p craniotomy with resection of tumor and evacuation of SDH on 5/8. Pt with decreased responsiveness and CT showed acute SDH with 14mm shift, returned to OR for L crani revision 5/9. PMH significant of dementia, atrial fibrillation, hypothyroidism, meningioma, prostate cancer.    PT Comments  Pt today received in chair after wife alerted this PT in hallway that pt was not looking good. Pt was found in chair with eyes open and drooling however unresponsive to verbal stimuli. BP was taken and pt was transferred back to bed with +2 Min A. RN was called into room. BP: 83/68 seated, BP: 121/85 supine upon returning to bed, BP: 115/81 after 5 minutes supine. No change in DC/DME recs at this time. Pt was expected to DC to CIR today. PT will continue to follow.      If plan is discharge home, recommend the following: Two people to help with walking and/or transfers;A lot of help with bathing/dressing/bathroom;Assistance with cooking/housework;Assistance with feeding;Direct supervision/assist for medications management;Help with stairs or ramp for entrance;Assist for transportation;Supervision due to cognitive status   Can travel by private vehicle     No  Equipment Recommendations  Wheelchair (measurements PT);Wheelchair cushion (measurements PT);Hospital bed    Recommendations for Other Services       Precautions / Restrictions Precautions Precautions: Fall Recall of Precautions/Restrictions: Impaired Precaution/Restrictions Comments: L crani Restrictions Weight Bearing Restrictions Per Provider Order: No     Mobility  Bed Mobility Overal bed mobility: Needs Assistance Bed Mobility: Sit to Supine        Sit to supine: +2 for physical assistance, Max assist   General bed mobility comments: +2 max via helicopter    Transfers Overall transfer level: Needs assistance Equipment used: 2 person hand held assist Transfers: Sit to/from Stand, Bed to chair/wheelchair/BSC Sit to Stand: Min assist, +2 safety/equipment Stand pivot transfers: +2 physical assistance, Min assist         General transfer comment: +2 Min A to return to chair    Ambulation/Gait               General Gait Details: deferred   Stairs             Wheelchair Mobility     Tilt Bed    Modified Rankin (Stroke Patients Only) Modified Rankin (Stroke Patients Only) Pre-Morbid Rankin Score: Slight disability Modified Rankin: Moderately severe disability     Balance Overall balance assessment: History of Falls, Needs assistance Sitting-balance support: Feet supported, Bilateral upper extremity supported Sitting balance-Leahy Scale: Fair Sitting balance - Comments: statically   Standing balance support: Reliant on assistive device for balance, Bilateral upper extremity supported Standing balance-Leahy Scale: Poor Standing balance comment: dependent on at least one UE for balance statically and RW dynamically as well as external support                            Communication Communication Communication: Impaired Factors Affecting Communication: Hearing impaired  Cognition Arousal: Alert, Suspect due to medications Behavior During Therapy: Flat affect   PT - Cognitive impairments: Memory, Initiation, Sequencing, Problem solving, Safety/Judgement, Attention, Awareness  PT - Cognition Comments: Pt found in chair alert but unresponsive to verbal stimuli. Eyes were open and would track therapists however unable to verbal respond. Following commands: Impaired Following commands impaired: Follows one step commands with increased time    Cueing Cueing  Techniques: Verbal cues  Exercises      General Comments General comments (skin integrity, edema, etc.): BP: 83/68 seated, BP: 121/85 supine upon returning to bed, BP: 115/81 after 5 minutes supine.      Pertinent Vitals/Pain Pain Assessment Pain Assessment: Faces Faces Pain Scale: No hurt    Home Living                          Prior Function            PT Goals (current goals can now be found in the care plan section) Progress towards PT goals: Progressing toward goals    Frequency    Min 2X/week      PT Plan      Co-evaluation              AM-PAC PT "6 Clicks" Mobility   Outcome Measure  Help needed turning from your back to your side while in a flat bed without using bedrails?: A Lot Help needed moving from lying on your back to sitting on the side of a flat bed without using bedrails?: A Lot Help needed moving to and from a bed to a chair (including a wheelchair)?: A Lot Help needed standing up from a chair using your arms (e.g., wheelchair or bedside chair)?: A Lot Help needed to walk in hospital room?: Total Help needed climbing 3-5 steps with a railing? : Total 6 Click Score: 10    End of Session   Activity Tolerance: Treatment limited secondary to medical complications (Comment) Patient left: in bed;with call bell/phone within reach;with nursing/sitter in room;with family/visitor present Nurse Communication: Mobility status PT Visit Diagnosis: Unsteadiness on feet (R26.81);Muscle weakness (generalized) (M62.81);Other abnormalities of gait and mobility (R26.89);History of falling (Z91.81);Difficulty in walking, not elsewhere classified (R26.2)     Time: 1610-9604 PT Time Calculation (min) (ACUTE ONLY): 16 min  Charges:    $Therapeutic Activity: 8-22 mins PT General Charges $$ ACUTE PT VISIT: 1 Visit                     Glenyce Randle B, PT, DPT Acute Rehab Services [phone]    F[place]er [place] 10/09/2023, 12:11 PM

## 2023-10-09 NOTE — Consult Note (Addendum)
 NEUROLOGY CONSULT NOTE   Date of service: Oct 09, 2023 Patient Name: Jesse [name] MRN:  [id number] DOB:  1941/01/13 Chief Complaint: "Code Stroke" Requesting Provider: Jodeane Mulligan, DO  History of Present Illness  Jesse [name] is a 83 y.o. male with a past medical history of A fib, RLS, dementia, Prostate CA, meningioma (found in 2016) who was admitted to [facility] on 09/30/23 after an unwitnessed fall and a one week history of progressive weakness with cognitive change. He was found to have large heterogenous and hemorrhagic extra axial mass along left frontal convexity with 1.5 cm rightward midline shift and 7 mm subdural hemorrhage felt to be acute on chronic. He had a meningioma resection with SDH evacuation on 5/8 and was planning to be discharged to CIR today. His wife (a retired Engineer, civil (consulting)) got him up to the chair and helped him to the commode this morning. He did reportedly have a large bowel movement (known constipation post op and on a bowel regimen). A code stroke was activated due to AMS when she got him back into bed with the help of PT he was altered, aphasia, dysarthric. BP systolic 80s, glucose 140. Code stroke activated. Given NS bolus, BP recovered to systolic 120s.   LKW: 0900 Modified rankin score: 3-4 IV Thrombolysis: No, recent brain surgery and SDH EVT: No, no LVO   NIHSS components Score: Comment  1a Level of Conscious 0[x]  1[]  2[]  3[]      1b LOC Questions 0[x]  1[]  2[]       1c LOC Commands 0[x]  1[]  2[]       2 Best Gaze 0[x]  1[]  2[]       3 Visual 0[x]  1[]  2[]  3[]      4 Facial Palsy 0[x]  1[]  2[]  3[]      5a Motor Arm - left 0[x]  1[]  2[]  3[]  4[]  UN[]    5b Motor Arm - Right 0[x]  1[]  2[]  3[]  4[]  UN[]    6a Motor Leg - Left 0[x]  1[]  2[]  3[]  4[]  UN[]    6b Motor Leg - Right 0[x]  1[]  2[]  3[]  4[]  UN[]    7 Limb Ataxia 0[x]  1[]  2[]  UN[]      8 Sensory 0[x]  1[]  2[]  UN[]      9 Best Language 0[]  1[x]  2[]  3[]      10 Dysarthria 0[]  1[x]  2[]  UN[]      11 Extinct. and Inattention 0[x]  1[]   2[]       TOTAL: 2       ROS  Comprehensive ROS performed and pertinent positives documented in HPI   Past History   Past Medical History:  Diagnosis Date   Arthritis    Atrial fibrillation (HCC)    Complication of anesthesia    Atrial fibrillation during surgery    Dementia (HCC)    Depression    Hard of hearing    Hx of constipation    Osteoporosis    DEXA 11/09/10. Completed treatemtn with Fosamax.   Prostate cancer (HCC)    Restless legs    Thyroid  disease    hypothyroidism   Vitiligo     Past Surgical History:  Procedure Laterality Date   BACK SURGERY     COLONOSCOPY     CRANIOTOMY Left 10/03/2023   Procedure: CRANIOTOMY TUMOR EXCISION LEFT FRONTAL;  Surgeon: Elna Haggis, MD;  Location: [facility] OR;  Service: Neurosurgery;  Laterality: Left;  Left Frontal Craniotomy for Menigioma   CRANIOTOMY N/A 10/04/2023   Procedure: CRANIOTOMY HEMATOMA EVACUATION SUBDURAL;  Surgeon: Elna Haggis, MD;  Location: [facility] OR;  Service: Neurosurgery;  Laterality: N/A;  REPEAT   FLEXIBLE SIGMOIDOSCOPY N/A 09/27/2017   Procedure: FLEXIBLE SIGMOIDOSCOPY;  Surgeon: Kenney Peacemaker, MD;  Location: [facility] ENDOSCOPY;  Service: Endoscopy;  Laterality: N/A;   JOINT REPLACEMENT     LAMINECTOMY  10/2011   cervical and lumbar laminectomy   PROSTATE BIOPSY  07/10/2011   PROSTATE BIOPSY  09/22/15   TOTAL HIP ARTHROPLASTY Right 02/29/2012   TOTAL HIP ARTHROPLASTY Right 07/04/2016   Procedure: RIGHT TOTAL HIP ARTHROPLASTY ANTERIOR APPROACH;  Surgeon: Adonica Hoose, MD;  Location: [facility] OR;  Service: Orthopedics;  Laterality: Right;   TRANSURETHRAL RESECTION OF PROSTATE      Family History: Family History  Problem Relation Age of Onset   Atrial fibrillation Mother    Cystic fibrosis Mother    Atrial fibrillation Sister    Cancer Neg Hx    Esophageal cancer Neg Hx    Colon cancer Neg Hx    Rectal cancer Neg Hx    Stomach cancer Neg Hx     Social History  reports that he quit smoking about 49 years ago. His  smoking use included cigarettes. He has never used smokeless tobacco. He reports current alcohol use. He reports that he does not use drugs.  No Known Allergies  Medications   Current Facility-Administered Medications:    0.9 %  sodium chloride  infusion (Manually program via Guardrails IV Fluids), , Intravenous, Once, Elna Haggis, MD   acetaminophen  (TYLENOL ) tablet 650 mg, 650 mg, Oral, Q6H PRN, 650 mg at 10/06/23 1629 **OR** [DISCONTINUED] acetaminophen  (TYLENOL ) suppository 650 mg, 650 mg, Rectal, Q6H PRN, Felipe Horton, Rondell A, MD   bisacodyl  (DULCOLAX) suppository 10 mg, 10 mg, Rectal, Daily PRN, Elna Haggis, MD, 10 mg at 10/06/23 1500   ceFAZolin  (ANCEF ) IVPB 2g/100 mL premix, 2 g, Intravenous, Q8H, Elsner, Ace Abu, MD, Last Rate: 200 mL/hr at 10/09/23 0840, 2 g at 10/09/23 0840   Chlorhexidine  Gluconate Cloth 2 % PADS 6 each, 6 each, Topical, Daily, Elna Haggis, MD, 6 each at 10/08/23 0907   dexamethasone  (DECADRON ) injection 2 mg, 2 mg, Intravenous, Q12H, Elna Haggis, MD, 2 mg at 10/09/23 4098   docusate sodium  (COLACE) capsule 100 mg, 100 mg, Oral, BID, Elna Haggis, MD, 100 mg at 10/09/23 0834   finasteride (PROSCAR) tablet 5 mg, 5 mg, Oral, Daily, Samtani, Jai-Gurmukh, MD, 5 mg at 10/09/23 1191   labetalol  (NORMODYNE ) injection 10-40 mg, 10-40 mg, Intravenous, Q10 min PRN, Elna Haggis, MD, 40 mg at 10/04/23 2209   lactated ringers  bolus 500 mL, 500 mL, Intravenous, Once, Calkins, Derek W, DO   levETIRAcetam  (KEPPRA ) tablet 500 mg, 500 mg, Oral, BID, Zhou, Jenny Z, RPH, 500 mg at 10/09/23 4782   levothyroxine  (SYNTHROID ) tablet 50 mcg, 50 mcg, Oral, Q0600, Elna Haggis, MD, 50 mcg at 10/09/23 0545   melatonin tablet 5 mg, 5 mg, Oral, QHS PRN, Elna Haggis, MD   oxyCODONE  (Oxy IR/ROXICODONE ) immediate release tablet 5 mg, 5 mg, Oral, Q6H PRN, Harris, Whitney D, NP   pantoprazole  (PROTONIX ) EC tablet 40 mg, 40 mg, Oral, QHS, Mdala-Gausi, Masiku Agatha, MD, 40 mg at 10/08/23 2056    polyethylene glycol (MIRALAX  / GLYCOLAX ) packet 17 g, 17 g, Oral, Daily PRN, Elna Haggis, MD, 17 g at 10/06/23 1323   prochlorperazine  (COMPAZINE ) injection 5 mg, 5 mg, Intravenous, Q6H PRN, Elna Haggis, MD   senna (SENOKOT) tablet 8.6 mg, 1 tablet, Oral, BID, Elna Haggis, MD, 8.6 mg at 10/09/23 0834   sodium chloride  flush (NS) 0.9 %  injection 3-10 mL, 3-10 mL, Intravenous, Q12H, Shafer, Devon, NP   sodium chloride  flush (NS) 0.9 % injection 3-10 mL, 3-10 mL, Intravenous, PRN, Imogene Mana, NP   sodium phosphate  (FLEET) enema 1 enema, 1 enema, Rectal, Once PRN, Elsner, Henry, MD   tamsulosin (FLOMAX) capsule 0.4 mg, 0.4 mg, Oral, QPC breakfast, Harris, Laurina Popper D, NP, 0.4 mg at 10/09/23 0834  Vitals   Vitals:   10/09/23 0005 10/09/23 0356 10/09/23 0830 10/09/23 1147  BP: 127/84 114/68 124/71 115/81  Pulse: 73 69 66 72  Resp:      Temp: 97.6 F (36.4 C) 97.9 F (36.6 C) 97.6 F (36.4 C)   TempSrc: Oral Oral Oral   SpO2: 98% 97% 99%   Weight:      Height:        Body mass index is 22.97 kg/m.  Physical Exam   Constitutional: Ill appearing Psych: Affect appropriate to situation.  Eyes: No scleral injection. Bruising noted around left eye HENT: No OP obstruction.  Head: Normocephalic.  Cardiovascular: Normal rate and regular rhythm.  Respiratory: Effort normal, non-labored breathing.  GI: Soft.  No distension. There is no tenderness.  Skin: Staples on head are CDI  Neurologic Examination   Neuro: Mental Status: Patient is awake, alert, oriented to person, place, month, year, and situation. Responses are slow. Speech is mildly dysarthric, expressive aphasia noted with naming objects Patient is able to give a clear and coherent history. Cranial Nerves: II: Visual Fields are full. Pupils are equal, round, and reactive to light.   III,IV, VI: EOMI without ptosis or diploplia.  V: Facial sensation is symmetric to temperature VII: Left facial asymmetry VIII: Hearing is  intact to voice X: Palate elevates symmetrically XI: Shoulder shrug is symmetric. XII: Tongue protrudes midline without atrophy or fasciculations.  Motor: Tone is normal. Bulk is normal. 5/5 strength was present in all four extremities.  Sensory: Sensation is symmetric to light touch and temperature in the arms and legs.  Cerebellar: FNF intact, no ataxia noted   Labs/Imaging/Neurodiagnostic studies   CBC:  Recent Labs  Lab Oct 21, 2023 0559 10/09/23 0537  WBC 9.6 8.6  NEUTROABS  --  6.7  HGB 10.2* 10.7*  HCT 29.1* 30.7*  MCV 86.6 87.2  PLT 198 221   Basic Metabolic Panel:  Lab Results  Component Value Date   NA 132 (L) 10/09/2023   K 4.2 10/09/2023   CO2 24 10/09/2023   GLUCOSE 118 (H) 10/09/2023   BUN 20 10/09/2023   CREATININE 0.81 10/09/2023   CALCIUM  8.5 (L) 10/09/2023   GFRNONAA >60 10/09/2023   GFRAA >60 10/01/2017   Lipid Panel:  Lab Results  Component Value Date   LDLCALC 86 08/26/2020   HgbA1c:  Lab Results  Component Value Date   HGBA1C 5.7 08/26/2020   Urine Drug Screen:     Component Value Date/Time   LABOPIA NONE DETECTED 06/13/2017 1812   COCAINSCRNUR NONE DETECTED 06/13/2017 1812   LABBENZ NONE DETECTED 06/13/2017 1812   AMPHETMU NONE DETECTED 06/13/2017 1812   THCU NONE DETECTED 06/13/2017 1812   LABBARB NONE DETECTED 06/13/2017 1812    Alcohol Level     Component Value Date/Time   ETH <10 06/13/2017 1737   INR  Lab Results  Component Value Date   INR 1.2 10/04/2023   APTT  Lab Results  Component Value Date   APTT 26 10/04/2023     5/12 CT Head without contrast(Personally reviewed): 1. Continued interval evolution of postoperative changes from  recent left frontal mass resection, and subsequent left frontal parenchymal hematoma evacuation. 2. Patchy foci of parenchymal hemorrhage within the left frontal lobe, non-progressed. Surrounding parenchymal edema has slightly progressed. 3. Residual mixed density subdural hematoma  along the left cerebral convexity, measuring up to 11 mm in thickness.  4. Persistent mass effect with partial effacement of the left lateral ventricle. Decreased rightward midline shift now measuring 7-8 mm (previously 9 mm). 5. Thin subdural hematoma along the right cerebral convexity (measuring up to 3 mm in thickness), non-progressed. 6. Trace hemorrhage within the right lateral ventricle occipital horn, unchanged. 7. Subdural hemorrhage along the mid and posterior falx (measuring up to 3 mm in thickness), also not significantly changed.  5/14 CT Head without contrast: IMPRESSION: 1. Redemonstrated postsurgical changes of left frontotemporal craniotomy for resection of left frontal lobe mass and parenchymal hematoma evacuation. 2. Similar appearance of multi compartment intracranial hemorrhage. No new or enlarging foci of intracranial hemorrhage. 3. Similar mass effect with approximately 9 mm rightward midline shift. 4. ASPECTS is 10  CT angio Head and Neck with contrast(Personally reviewed): IMPRESSION: No large vessel occlusion.   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   Accounting for differences in modality there is slightly decreased mass effect on the distal ACA branches compared to prior MRA.   1.5 mm outpouching along the right supraclinoid ICA at the origin of the posterior communicating artery which may reflect an infundibular origin versus less likely small aneurysm.   Atherosclerosis in the neck as above without high-grade stenosis.   Focal severe stenosis at the origin of the non dominant left vertebral artery on the aortic arch.  Neurodiagnostics rEEG:  Pending  ASSESSMENT   Jesse [name] is a 83 y.o. male who underwent a meningioma resection and SDH evacuation on 5/8 was an inpatient code stroke today after an episode of aphasia, dysarthia, and altered mental status. RN at bedside reports patient had a large bowel movement and  wife was helping him back to bed when she called out for assistance by PT. BP noted to be systolic 80s, glucose 140s. Stat head CT and CTA head and neck obtained. Since imaging he has had improvement in his dysarthria and aphasia.   Differentials: -Syncopal episode/vagal response to bowel movement  -Seizure/electrographic dysfunction in the area of tumor resection and left cerebral hemisphere -Low likelihood but stroke is in the differentials  RECOMMENDATIONS  - Hold off on discharge to CIR until neuro status is stable - Avoid hypotension - Bedside RN swallow study prior to PO medications and food - q2 neuro checks  - Stat EEG -Give a load of Keppra  1 g IV x 1 and increase Keppra  to 750 twice daily. -MRI brain with and without contrast if possible Further recommendations based on clinical course and imaging studies as well as EEG studies  Plan discussed with Dr. Stephany Ehrich ______________________________________________________________________    Signed, Imogene Mana, NP [facility]   Attending Neurohospitalist Addendum Patient seen and examined with APP/Resident. Agree with the history and physical as documented above. Agree with the plan as documented, which I helped formulate. I have independently reviewed the chart, obtained history, review of systems and examined the patient.I have personally reviewed pertinent head/neck/spine imaging (CT/MRI). Please feel free to call with any questions.  -- Tona Francis, MD Neurologist [facility] Pager: [phone]

## 2023-10-09 NOTE — PMR Pre-admission (Signed)
 PMR Admission Coordinator Pre-Admission Assessment  Patient: Jesse [name] is an 83 y.o., male MRN: [id number] DOB: [birth year] Height: 5\' 11"  (180.3 cm) Weight: 74.7 kg  Insurance Information HMO:     PPO:      PCP:      IPA:      80/20:      OTHER:  PRIMARY: Medicare Part A and B      Policy#: [id number]       Subscriber: pt CM Name:       Phone#:      Fax#:  Pre-Cert#: verified Health and safety inspector:  Benefits:  Phone #:     Name:  Eff. Date: A 09/25/04, B 07/27/07     Deduct: $1676      Out of Pocket Max: n/a      Life Max: n/a CIR: 100%      SNF: 20 full days Outpatient: 80%     Co-Pay: 20% Home Health: 100%      Co-Pay:  DME: 80%     Co-Pay: 20% Providers:  SECONDARY: BCBS Other      Policy#: [id number]     Phone#: [phone]  Financial Counselor:       Phone#:   The "Data Collection Information Summary" for patients in Inpatient Rehabilitation Facilities with attached "Privacy Act Statement-Health Care Records" was provided and verbally reviewed with: Patient and Family  Emergency Contact Information Contact Information     Name Relation Home Work Mobile   Burnsworth,[name] A Spouse [phone]        Other Contacts   None on File     Current Medical History  Patient Admitting Diagnosis: meningioma s/p crani resection   History of Present Illness: Pt is an 83 y/o male with PMH of dementia, atrial fibrillation, hypothyroidism, prostate cancer and meningioma for which he has previously refused resection.  He presented to [facility] on 09/30/23 after an unwitnessed fall at home.  Wife notes he was found down on top of his walker with blood on his shirt, and after fall legs seemed to not work.  In ED BP elevated to 170/108, sodium 128, chloride 95, CO2 21.  CT imaging of head and cspine revealed known heterogenous and hemorrhagic mass along the left frontal cortex with a 1.5 cm rightward shift which is significantly increased in size from previous imaging (2019) with 7mm  edema within the collection suggestive of acute hemorrhage.  Neurosurgery was consulted.  Per Dr. Ruthann Cover discussion with the spouse pt has had an acute deterioration over the previous few weeks.  He recommended surgical resection of the tumor and pt was agreeable.  Pt underwent craniotomy with resection of meningioma and evacuation of SDH on 5/8 per Dr. Ellery Guthrie.  Post op course complicated by decreasing responsiveness and perseverative.  Stat CT showed accumulated acute subdural hematoma with increasing shift and mass effect.  He was taken back to the OR for revision of his crani with evacuation of new SDH on 5/9 per Dr. Ellery Guthrie.  Post op scans show expected post op evolution of L frontal mass with slight mass effect on the left lateral ventricle.  He is on decadron  and keppra  BID per NS recs.  Eliquis resumed for afib on 5/13.  Voiding trial on 5/13 and pt requiring intermittent catheterization.  Therapy ongoing and pt has been recommended for CIR.   Complete NIHSS TOTAL: 2  Patient's medical record from [name] has been reviewed by the rehabilitation admission coordinator and  physician.  Past Medical History  Past Medical History:  Diagnosis Date   Arthritis    Atrial fibrillation (HCC)    Complication of anesthesia    Atrial fibrillation during surgery    Dementia (HCC)    Depression    Hard of hearing    Hx of constipation    Osteoporosis    DEXA 11/09/10. Completed treatemtn with Fosamax.   Prostate cancer (HCC)    Restless legs    Thyroid  disease    hypothyroidism   Vitiligo     Has the patient had major surgery during 100 days prior to admission? Yes  Family History   family history includes Atrial fibrillation in his mother and sister; Cystic fibrosis in his mother.  Current Medications  Current Facility-Administered Medications:    0.9 %  sodium chloride  infusion (Manually program via Guardrails IV Fluids), , Intravenous, Once, Elna Haggis, MD   acetaminophen  (TYLENOL )  tablet 650 mg, 650 mg, Oral, Q6H PRN, 650 mg at 10/06/23 1629 **OR** [DISCONTINUED] acetaminophen  (TYLENOL ) suppository 650 mg, 650 mg, Rectal, Q6H PRN, Felipe Horton, Rondell A, MD   bisacodyl  (DULCOLAX) suppository 10 mg, 10 mg, Rectal, Daily PRN, Elna Haggis, MD, 10 mg at 10/06/23 1500   ceFAZolin  (ANCEF ) IVPB 2g/100 mL premix, 2 g, Intravenous, Q8H, Elsner, Ace Abu, MD, Last Rate: 200 mL/hr at 10/09/23 0840, 2 g at 10/09/23 0840   Chlorhexidine  Gluconate Cloth 2 % PADS 6 each, 6 each, Topical, Daily, Elna Haggis, MD, 6 each at 10/08/23 0907   dexamethasone  (DECADRON ) injection 2 mg, 2 mg, Intravenous, Q12H, Elna Haggis, MD, 2 mg at 10/09/23 9562   docusate sodium  (COLACE) capsule 100 mg, 100 mg, Oral, BID, Elna Haggis, MD, 100 mg at 10/09/23 0834   finasteride (PROSCAR) tablet 5 mg, 5 mg, Oral, Daily, Samtani, Jai-Gurmukh, MD, 5 mg at 10/09/23 1308   labetalol  (NORMODYNE ) injection 10-40 mg, 10-40 mg, Intravenous, Q10 min PRN, Elna Haggis, MD, 40 mg at 10/04/23 2209   levETIRAcetam  (KEPPRA ) tablet 500 mg, 500 mg, Oral, BID, Cleven Dallas, RPH, 500 mg at 10/09/23 6578   levothyroxine  (SYNTHROID ) tablet 50 mcg, 50 mcg, Oral, Q0600, Elna Haggis, MD, 50 mcg at 10/09/23 0545   melatonin tablet 5 mg, 5 mg, Oral, QHS PRN, Elna Haggis, MD   oxyCODONE  (Oxy IR/ROXICODONE ) immediate release tablet 5 mg, 5 mg, Oral, Q6H PRN, Harris, Whitney D, NP   pantoprazole  (PROTONIX ) EC tablet 40 mg, 40 mg, Oral, QHS, Mdala-Gausi, Masiku Agatha, MD, 40 mg at 10/08/23 2056   polyethylene glycol (MIRALAX  / GLYCOLAX ) packet 17 g, 17 g, Oral, Daily PRN, Elna Haggis, MD, 17 g at 10/06/23 1323   prochlorperazine  (COMPAZINE ) injection 5 mg, 5 mg, Intravenous, Q6H PRN, Elna Haggis, MD   senna (SENOKOT) tablet 8.6 mg, 1 tablet, Oral, BID, Elna Haggis, MD, 8.6 mg at 10/09/23 4696   sodium phosphate  (FLEET) enema 1 enema, 1 enema, Rectal, Once PRN, Elna Haggis, MD   tamsulosin (FLOMAX) capsule 0.4 mg, 0.4 mg, Oral,  QPC breakfast, Harris, Whitney D, NP, 0.4 mg at 10/09/23 2952  Patients Current Diet:  Diet Order             Diet regular Room service appropriate? Yes with Assist; Fluid consistency: Thin  Diet effective now                   Precautions / Restrictions Precautions Precautions: Fall Precaution/Restrictions Comments: L crani Restrictions Weight Bearing Restrictions Per Provider Order: No   Has the patient  had 2 or more falls or a fall with injury in the past year? Yes  Prior Activity Level Limited Community (1-2x/wk): per spouse, mod I with RW for mobility and ADLs  Prior Functional Level Self Care: Did the patient need help bathing, dressing, using the toilet or eating? Independent  Indoor Mobility: Did the patient need assistance with walking from room to room (with or without device)? Independent  Stairs: Did the patient need assistance with internal or external stairs (with or without device)? Independent  Functional Cognition: Did the patient need help planning regular tasks such as shopping or remembering to take medications? Needed some help  Patient Information Are you of Hispanic, Latino/a,or Spanish origin?: A. No, not of Hispanic, Latino/a, or Spanish origin What is your race?: A. White Do you need or want an interpreter to communicate with a doctor or health care staff?: 0. No  Patient's Response To:  Health Literacy and Transportation Is the patient able to respond to health literacy and transportation needs?: Yes Health Literacy - How often do you need to have someone help you when you read instructions, pamphlets, or other written material from your doctor or pharmacy?: Never In the past 12 months, has lack of transportation kept you from medical appointments or from getting medications?: No In the past 12 months, has lack of transportation kept you from meetings, work, or from getting things needed for daily living?: No  Home Assistive Devices /  Equipment Home Equipment: Agricultural consultant (2 wheels), BSC/3in1, Wheelchair - manual  Prior Device Use: Indicate devices/aids used by the patient prior to current illness, exacerbation or injury? Walker  Current Functional Level Cognition  Orientation Level: Oriented X4    Extremity Assessment (includes Sensation/Coordination)  Upper Extremity Assessment: Generalized weakness, RUE deficits/detail RUE Deficits / Details: 4-/5 as compared to L 4/5  Lower Extremity Assessment: Defer to PT evaluation RLE Deficits / Details: grossly 4/5 to MMT, reports no change in sensation. needs further testing when pt needing less hands-on assist for balance. functional without buckling to stand RLE Sensation: WNL    ADLs  Overall ADL's : Needs assistance/impaired Eating/Feeding: Set up, Sitting Grooming: Minimal assistance, Standing Grooming Details (indicate cue type and reason): intermittent min A for balance secondary to R lateral lean Upper Body Bathing: Contact guard assist, Sitting Lower Body Bathing: Sitting/lateral leans, Minimal assistance Upper Body Dressing : Sitting, Set up Lower Body Dressing: Sit to/from stand, Minimal assistance Toilet Transfer: Minimal assistance, Ambulation, Rolling walker (2 wheels) Toilet Transfer Details (indicate cue type and reason): min A for balance Toileting- Clothing Manipulation and Hygiene: Sit to/from stand, Moderate assistance Functional mobility during ADLs: Minimal assistance, Rolling walker (2 wheels)    Mobility  Overal bed mobility: Needs Assistance Bed Mobility: Supine to Sit Supine to sit: Supervision Sit to supine: Mod assist, HOB elevated, Used rails General bed mobility comments: no physical assistance provided.    Transfers  Overall transfer level: Needs assistance Equipment used: Rolling walker (2 wheels) Transfers: Sit to/from Stand Sit to Stand: Min assist, +2 safety/equipment Bed to/from chair/wheelchair/BSC transfer type:: Step  pivot Step pivot transfers: Mod assist General transfer comment: cues for hand placement, light assist for balance on rise    Ambulation / Gait / Stairs / Wheelchair Mobility  Ambulation/Gait Ambulation/Gait assistance: Min assist, +2 safety/equipment Gait Distance (Feet): 20 Feet Assistive device: Rolling walker (2 wheels) Gait Pattern/deviations: Step-to pattern, Decreased stride length General Gait Details: Pt able to ambulate in room with RW Min A. +2 for  safety. no LOB noted. Able to stand at sink with OT for ~5 minutes. Gait velocity: decreased Gait velocity interpretation: <1.31 ft/sec, indicative of household ambulator Stairs: Yes Stairs assistance: Min assist Stair Management: Two rails, Alternating pattern, Step to pattern, Forwards Number of Stairs: 2 General stair comments: pt performed alternating gait pattern ascending the stairs and step to gait pattern descending the stairs.    Posture / Balance Dynamic Sitting Balance Sitting balance - Comments: statically Balance Overall balance assessment: History of Falls, Needs assistance Sitting-balance support: Feet supported, Bilateral upper extremity supported Sitting balance-Leahy Scale: Fair Sitting balance - Comments: statically Standing balance support: Reliant on assistive device for balance, Bilateral upper extremity supported Standing balance-Leahy Scale: Poor Standing balance comment: dependent on at least one UE for balance statically and RW dynamically as well as external support    Special needs/care consideration Skin crani incision  and Behavioral consideration past history of dementia and brain tumor   Previous Home Environment (from acute therapy documentation) Living Arrangements: Spouse/significant other Available Help at Discharge: Family Type of Home: House Home Layout: Two level, 1/2 bath on main level, Able to live on main level with bedroom/bathroom Home Access: Level entry Bathroom Shower/Tub:  Walk-in shower (upstairs) Bathroom Toilet: Handicapped height Home Care Services: No  Discharge Living Setting Plans for Discharge Living Setting: Patient's home, Lives with (comment) (spouse) Type of Home at Discharge: House Discharge Home Layout: One level, Laundry or work area in basement Discharge Home Access: Stairs to enter Entrance Stairs-Rails: None Secretary/administrator of Steps: 2-3 Discharge Bathroom Shower/Tub: Walk-in shower Discharge Bathroom Toilet: Handicapped height Discharge Bathroom Accessibility: Yes How Accessible: Accessible via walker Does the patient have any problems obtaining your medications?: No  Social/Family/Support Systems Patient Roles: Spouse Anticipated Caregiver: [name] Anticipated Industrial/product designer Information: [phone] Ability/Limitations of Caregiver: min assist Caregiver Availability: 24/7 Discharge Plan Discussed with Primary Caregiver: Yes Is Caregiver In Agreement with Plan?: Yes Does Caregiver/Family have Issues with Lodging/Transportation while Pt is in Rehab?: No  Goals Patient/Family Goal for Rehab: PT/OT/SLP supervision to min assist Expected length of stay: 9-12 days Additional Information: DIscharge plan: home with pt's spouse and she can provide 24/7, up to min assist for mobility and ADLs Pt/Family Agrees to Admission and willing to participate: Yes Program Orientation Provided & Reviewed with Pt/Caregiver Including Roles  & Responsibilities: Yes  Decrease burden of Care through IP rehab admission: n/a  Possible need for SNF placement upon discharge:  Not anticipated.  Plan for d/c home with spouse who can provide 24/7 min assist.   Patient Condition: I have reviewed medical records from [facility] spoken with [facility] team, and patient and spouse. I met with patient at the bedside for inpatient rehabilitation assessment.  Patient will benefit from ongoing PT, OT, and SLP, can actively participate in 3 hours of therapy a day  5 days of the week, and can make measurable gains during the admission.  Patient will also benefit from the coordinated team approach during an Inpatient Acute Rehabilitation admission.  The patient will receive intensive therapy as well as Rehabilitation physician, nursing, social worker, and care management interventions.  Due to bladder management, safety, skin/wound care, disease management, medication administration, pain management, and patient education the patient requires 24 hour a day rehabilitation nursing.  The patient is currently min assist with mobility and basic ADLs.  Discharge setting and therapy post discharge at home with home health is anticipated.  Patient has agreed to participate in the Acute Inpatient Rehabilitation Program  and will admit today.  Preadmission Screen Completed By:  Mickey Alar, PT, DPT 10/09/2023 10:53 AM ______________________________________________________________________   Discussed status with Dr. Rayleen Cal on 10/09/23  at 11:20 AM  and received approval for admission today.  Admission Coordinator:  Caitlin E Warren, PT, time 11:20 AM Alanna Hu 10/09/23    Assessment/Plan: Diagnosis: meningioma s/p crani  Does the need for close, 24 hr/day Medical supervision in concert with the patient's rehab needs make it unreasonable for this patient to be served in a less intensive setting? Yes Co-Morbidities requiring supervision/potential complications: Afib, Hypothyroid, Anemia, hx of prostate cancer Due to bladder management, bowel management, safety, skin/wound care, disease management, medication administration, pain management, and patient education, does the patient require 24 hr/day rehab nursing? Yes Does the patient require coordinated care of a physician, rehab nurse, PT, OT, and SLP to address physical and functional deficits in the context of the above medical diagnosis(es)? Yes Addressing deficits in the following areas: balance, endurance, locomotion,  strength, transferring, bowel/bladder control, bathing, dressing, feeding, grooming, toileting, cognition, speech, language, and psychosocial support Can the patient actively participate in an intensive therapy program of at least 3 hrs of therapy 5 days a week? Yes The potential for patient to make measurable gains while on inpatient rehab is excellent Anticipated functional outcomes upon discharge from inpatient rehab: supervision and min assist PT, supervision and min assist OT, supervision and min assist SLP Estimated rehab length of stay to reach the above functional goals is: 9-12 Anticipated discharge destination: Home 10. Overall Rehab/Functional Prognosis: good   MD Signature: Lylia Sand

## 2023-10-09 NOTE — Progress Notes (Signed)
 [facility] night cross cover note:   I was notified by the patient's RN that this patient's blood pressure this evening was initially running soft, with reported blood pressure of 93/50.  RN conveys that the patient is hospitalized with intracranial hemorrhage, and was intended to be discharged to rehab today, but discharge was delayed after the patient experienced an episode of lethargy and hypotension while ambulating during dayshift.   RN conveys that the patient's mental status has improved, and that he is currently without acute complaint.  Additional vital signs appear stable, including afebrile; heart rates in the 70s; respiratory rate 16, and oxygen saturation 99 to 100% on room air.  I subsequently ordered a 500 cc LR bolus.    Update: Following the 500 cc LR bolus, blood pressure has improved, with most recent blood pressure noted to be 118/70, with additional vital signs appearing similar to the above, including heart rates in the 70s, respiratory rate 16, oxygen saturation remaining in the high 90s on room air.    Camelia Cavalier, DO Hospitalist

## 2023-10-09 NOTE — Code Documentation (Addendum)
 Jesse [name] is an 83 yr old male inpatient on [address] recovering from Crainiotomy for brain mass and intracranial bleed. He was preparing to go to CIR today, then had an acute episode of aphasia after having a bowel movement.  Code stroke activated by [address] [employer] fundraiser.    Stroke team to pt's bedside. CBG, BP obtained. Pt examined: NIHSS 2. Pt with aphasia and dysarthria. Pt to CT with team. The following imaging was obtained: CT, CTA. Per Dr. Bonnita Buttner, PCT negative for acute hemorrhage. CTA negative for LVO. Pt not candidate for TNK due to recent brain surgury and bleeding. Pt not candidate for IR due to LVO neg.    Pt returned to [address]. EEG to be obtained. NS bolus in progress. Pt will need q 2 NIHSS for 12 hrs, then q 4. Pt will need [facility] bedside swallow screen before any pos given. Bedside hand off with [place] complete.

## 2023-10-09 NOTE — Care Management Important Message (Signed)
 Important Message  Patient Details  Name: SHIGEKI FURNO MRN: [id number] Date of Birth: 03-22-1941   Important Message Given:  Yes - Medicare IM     Wynonia Hedges 10/09/2023, 3:11 PM

## 2023-10-09 NOTE — Progress Notes (Signed)
EEG complete. Results pending.  ?

## 2023-10-09 NOTE — Progress Notes (Signed)
 Patient ID: Jesse [name], male   DOB: 05-Nov-1940, 83 y.o.   MRN: [id number] Steady improvement.  Patient is ready for transfer to CIR.  I will leave sutures in place until the following Tuesday.  I will see him in rehab.

## 2023-10-09 NOTE — Discharge Summary (Signed)
 Physician Discharge Summary   Patient: Jesse [name] MRN: [id number] DOB: 09-23-1940  Admit date:     09/30/2023  Discharge date: 10/09/23  Discharge Physician: Jodeane Mulligan   PCP: Rodney Clamp, MD   Recommendations at discharge:    Pt to be discharged to SNF-[facility].   If you experience worsening fever, chills, chest pain, shortness of breath, or other concerning symptoms, please call your PCP or go to the emergency department immediately.  Discharge Diagnoses: Principal Problem:   Intracranial bleed (HCC) Active Problems:   Brain mass   Acute encephalopathy   Hyponatremia   Hypokalemia   Hypocalcemia   Paroxysmal atrial fibrillation (HCC)   Hypothyroidism   Mild dementia (HCC)   Prostate cancer (HCC)  Resolved Problems:   * No resolved hospital problems. *   Hospital Course:  83 year old white male Depression Hip arthritis Previous sigmoid volvulus Remote history atrial fibrillation 2013 hypothyroid Previous diagnosed 2016 meningioma 4.1 cm previously declined surgery Prostate cancer RLS   Came to emergency room 5/5 unwitnessed fall facial swelling dislodgment of several teeth with confusion and enlarging left frontal meningioma with bilateral subdural hematomas underwent craniotomy resection 10/03/2023 was on ventilator   5/5 admitted  5/8 craniotomy and tumor resection/ SDH evacuation 5/9 Less responsive -CT showing re-accumulation of SDH with 14mm MLS taken to OR for revision of crani and evacuation of SDH, extubated post op.  EBL , given albumin, s/p 2u FFP, 1.2L LR in OR 5/10 no issues overnight  5/11 urinary retention overnight now s/p 4 in and out caths, placed indwelling Foley this a.m.   Assessment and Plan:  Meningioma subdural hematomas with resection 10/03/2023 and reaccumulation of SDH - Followed closely by neurosurgery.  Appears to be doing well.  Can transition to CIR.  Remaining on IV cefazolin  for 7 days postop.  Patient to follow-up for  staple removal with neurosurgery in 1 week.  Neurosurgery to follow while in rehab.  Discontinue Decadron .  Continue Keppra  500 mg twice daily.  Systolic blood pressure goal less than 160.  Pain control with Tylenol , oxycodone  5 mg IR as needed severe pain.  Paroxysmal atrial fibrillation - On amiodarone.  Resume Eliquis per neurosurgery.  Urinary retention with history of prostate cancer status post prostatectomy and XRT - Continues to have urinary retention.  Failed voiding trial.  Patient to be discharged with Foley catheter.  Will need to follow-up with urology in the outpatient setting.  Hypothyroidism - Continue Synthroid  as prescribed.  Goals of care - Patient to transition to CIR for ongoing PT rehab.    Consultants: Neurosurgery Procedures performed: Craniotomy with tumor resection, SDH evacuation, revision craniotomy and excavation of SDH Disposition: Skilled nursing facility Diet recommendation:  Discharge Diet Orders (From admission, onward)     Start     Ordered   10/09/23 0000  Diet - low sodium heart healthy        10/09/23 1125           Regular diet  DISCHARGE MEDICATION: Allergies as of 10/09/2023   No Known Allergies      Medication List     TAKE these medications    acetaminophen  325 MG tablet Commonly known as: TYLENOL  Take 2 tablets (650 mg total) by mouth every 6 (six) hours as needed for mild pain (pain score 1-3) (or Fever >/= 101).   ceFAZolin  2-4 GM/100ML-% IVPB Commonly known as: ANCEF  Inject 100 mLs (2 g total) into the vein every 8 (eight) hours for  3 days.   docusate sodium  100 MG capsule Commonly known as: COLACE Take 1 capsule (100 mg total) by mouth 2 (two) times daily.   finasteride 5 MG tablet Commonly known as: PROSCAR Take 1 tablet (5 mg total) by mouth daily. Start taking on: Oct 10, 2023   levETIRAcetam  500 MG tablet Commonly known as: KEPPRA  Take 1 tablet (500 mg total) by mouth 2 (two) times daily.   melatonin 5  MG Tabs Take 1 tablet (5 mg total) by mouth at bedtime as needed.   oxyCODONE  5 MG immediate release tablet Commonly known as: Oxy IR/ROXICODONE  Take 1 tablet (5 mg total) by mouth every 6 (six) hours as needed for severe pain (pain score 7-10).   pantoprazole  40 MG tablet Commonly known as: PROTONIX  Take 1 tablet (40 mg total) by mouth at bedtime.   polyethylene glycol 17 g packet Commonly known as: MIRALAX  / GLYCOLAX  Take 17 g by mouth daily as needed for mild constipation.   senna 8.6 MG Tabs tablet Commonly known as: SENOKOT Take 1 tablet (8.6 mg total) by mouth 2 (two) times daily.   Synthroid  50 MCG tablet Generic drug: levothyroxine  TAKE 1 TABLET DAILY BEFORE BREAKFAST   tamsulosin 0.4 MG Caps capsule Commonly known as: FLOMAX Take 1 capsule (0.4 mg total) by mouth daily after breakfast. Start taking on: Oct 10, 2023   VITAMIN B 12 PO Take 1,000 Units by mouth 3 (three) times a week.               Discharge Care Instructions  (From admission, onward)           Start     Ordered   10/09/23 0000  Change dressing on IV access line weekly and PRN  (Home infusion instructions - [facility] )        10/09/23 1125             Discharge Exam: Filed Weights   09/30/23 2000  Weight: 74.7 kg    GENERAL:  Alert, pleasant, no acute distress  HEENT:  EOMI, well-healing craniotomy surgical incision with staples CARDIOVASCULAR:  RRR, no murmurs appreciated RESPIRATORY:  Clear to auscultation, no wheezing, rales, or rhonchi GASTROINTESTINAL:  Soft, nontender, nondistended EXTREMITIES:  No LE edema bilaterally NEURO: Mild left facial droop no new focal deficits appreciated SKIN:  No rashes noted PSYCH:  Appropriate mood and affect     Condition at discharge: improving  The results of significant diagnostics from this hospitalization (including imaging, microbiology, ancillary and laboratory) are listed below for reference.   Imaging  Studies: CT HEAD WO CONTRAST ( ) Result Date: 10/07/2023 CLINICAL DATA:  Provided history: Check resolution of shift. EXAM: CT HEAD WITHOUT CONTRAST TECHNIQUE: Contiguous axial images were obtained from the base of the skull through the vertex without intravenous contrast. RADIATION DOSE REDUCTION: This exam was performed according to the departmental dose-optimization program which includes automated exposure control, adjustment of the mA and/or kV according to patient size and/or use of iterative reconstruction technique. COMPARISON:  Prior head CT examinations 10/04/2023 and earlier. Brain MRI 09/30/2023. FINDINGS: Brain: Continued interval evolution of postoperative changes from recent prior resection of a left frontal mass and from subsequent left frontal parenchymal hematoma evacuation. Small patchy foci of parenchymal hemorrhage within the left frontal lobe (at site of prior left frontal mass resection) are non-progressed. Edema at this site has slightly progressed. Pneumocephalus persistent although decreased pneumocephalus scattered along the left greater than right frontal lobes. Residual mixed density  subdural hematoma along the left cerebral convexity, measuring up to 11 mm in thickness (for instance as seen on series 5, image 50). Subdural hemorrhage along the right cerebral convexity measuring up to 3 mm in thickness, present on the prior head CT of 10/04/2023 but better appreciated on today's study (for instance as seen on series 5, image 25). Persistent mass effect with partial effacement of the left lateral ventricle. Rightward midline shift now measures 7-8 mm (previously 9 mm). Persistent trace hemorrhage within the occipital horn of the right lateral ventricle. Unchanged size and configuration of the ventricular system. Subdural hemorrhage along the mid and posterior falx, measuring up to 3 mm in thickness, similar to the prior CT. Vascular: No hyperdense vessel.  Atherosclerotic  calcifications. Skull: Left-sided cranioplasty. Sinuses/Orbits: No orbital mass or acute orbital finding. No significant paranasal sinus disease. IMPRESSION: 1. Continued interval evolution of postoperative changes from recent left frontal mass resection, and subsequent left frontal parenchymal hematoma evacuation. 2. Patchy foci of parenchymal hemorrhage within the left frontal lobe, non-progressed. Surrounding parenchymal edema has slightly progressed. 3. Residual mixed density subdural hematoma along the left cerebral convexity, measuring up to 11 mm in thickness. 4. Persistent mass effect with partial effacement of the left lateral ventricle. Decreased rightward midline shift now measuring 7-8 mm (previously 9 mm). 5. Thin subdural hematoma along the right cerebral convexity (measuring up to 3 mm in thickness), non-progressed. 6. Trace hemorrhage within the right lateral ventricle occipital horn, unchanged. 7. Subdural hemorrhage along the mid and posterior falx (measuring up to 3 mm in thickness), also not significantly changed. Electronically Signed   By: Bascom Lily D.O.   On: 10/07/2023 15:01   CT HEAD WO CONTRAST ( ) Result Date: 10/04/2023 CLINICAL DATA:  Follow-up status post craniotomy. EXAM: CT HEAD WITHOUT CONTRAST TECHNIQUE: Contiguous axial images were obtained from the base of the skull through the vertex without intravenous contrast. RADIATION DOSE REDUCTION: This exam was performed according to the departmental dose-optimization program which includes automated exposure control, adjustment of the mA and/or kV according to patient size and/or use of iterative reconstruction technique. COMPARISON:  CT head earlier same day, MRI head 09/30/2023. FINDINGS: Brain: Postsurgical changes of right frontotemporal craniotomy. Interval increase in pneumocephalus over the left frontal lobe and new component of pneumocephalus over the anterior right frontal lobe and right temporal lobe. Mixed attenuation  subdural collection now measures up to 10 mm in thickness with decreased component of hyperattenuating acute blood products compared to earlier same day CT. Region of patchy parenchymal hemorrhage is significantly decreased from prior with few scattered areas of hemorrhage noted the largest of which measures up to 12 mm. Similar edema in the left frontal lobe. There is decreased mass effect compared to prior now with up to 9 mm of rightward midline shift, previously 14 mm. Additional component of subdural hemorrhage posteriorly along the falx measuring up to 3 mm in thickness likely reflecting slight redistribution of blood products. Decreased mass effect on the left lateral ventricle. Slightly decreased caliber of the right lateral ventricle. Decreased mass effect on the third ventricle also noted. Trace blood products within the right occipital horn. Vascular: Atherosclerotic calcifications of the carotid siphons and intracranial vertebral arteries. No hyperdense vessel. Skull: Postsurgical changes without acute or aggressive finding. Sinuses/Orbits: Orbits are symmetric. No significant mucosal thickening in the paranasal sinuses. Other: Mastoid air cells are clear. Interval placement of surgical drain within the left frontoparietal scalp with tip overlying the anterior aspect of craniotomy site. There  is slightly increased intramuscular emphysema over the left frontotemporal scalp. IMPRESSION: Interval evacuation of intraparenchymal hemorrhage in the left frontal lobe. Scattered residual foci of parenchymal hemorrhage the largest of which measures up to 12 mm. Decreased mass effect now with 9 mm midline shift, previously 14 mm. Decreased subdural hematoma over the left cerebral convexity. Component of subdural hemorrhage along the posterior falx is minimally increased measuring up to 3 mm likely reflecting redistribution of blood products. Improved caliber of the ventricles as above. Trace blood products now  noted in the right occipital horn. Increased pneumocephalus as above. Electronically Signed   By: Denny Flack M.D.   On: 10/04/2023 16:45   CT HEAD WO CONTRAST ( ) Result Date: 10/04/2023 CLINICAL DATA:  Weakness and altered mental status EXAM: CT HEAD WITHOUT CONTRAST TECHNIQUE: Contiguous axial images were obtained from the base of the skull through the vertex without intravenous contrast. RADIATION DOSE REDUCTION: This exam was performed according to the departmental dose-optimization program which includes automated exposure control, adjustment of the mA and/or kV according to patient size and/or use of iterative reconstruction technique. COMPARISON:  Head CT and brain MRI from 4 days prior FINDINGS: Brain: Recent resection of left frontal mass. Subdural gas and hemorrhage measuring up to 11 mm in thickness. There is also patchy parenchymal hemorrhage at the level of prior left frontal mass effect, left frontal hemorrhage measuring up to 4 cm in length on coronal reformats. Midline shift measures 14 mm, mildly increased from preoperative. No detected infarct. Unchanged degree of right lateral ventriculomegaly. Vascular: Negative Skull: Unremarkable left-sided craniotomy. Sinuses/Orbits: Negative Other: Case discussed with Dr. Ellery Guthrie. IMPRESSION: Blood products in the left frontal lobe and spreading along the left subdural space, subdural collection measuring up to 11 mm in thickness. Midline shift is now 14 mm, mildly increased from preoperative exam. Electronically Signed   By: Ronnette Coke M.D.   On: 10/04/2023 06:55   MR BRAIN W WO CONTRAST Result Date: 09/30/2023 CLINICAL DATA:  Brain/CNS neoplasm, staging EXAM: MRI HEAD WITHOUT AND WITH CONTRAST TECHNIQUE: Multiplanar, multiecho pulse sequences of the brain and surrounding structures were obtained without and with intravenous contrast. CONTRAST:  8mL GADAVIST  GADOBUTROL  1 MMOL/ML IV SOLN COMPARISON:  MRI head September 04, 2017. FINDINGS: Brain:  Large (6.1 x 4.8 x 4.7 cm) enhancing and heterogeneous/hemorrhagic extra-axial dural-based mass along the left frontal convexity. Surrounding edema and mass effect with up to 1.3 cm rightward midline shift. Dural thickening and fluid collections along the left greater than right cerebral convexities, measuring up to 6 mm on the left and trace on the right. No evidence of acute infarct or hydrocephalus. Vascular: Major arterial flow voids are maintained skull base. Skull and upper cervical spine: Normal marrow signal. Sinuses/Orbits: Clear sinuses.  No acute orbital findings. IMPRESSION: 1. 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 Less likely considerations include hemangiopericytoma or dural metastasis. This may represent interval growth of the lesion seen on 2019 MRI. 2. Resulting 1.3 cm of rightward midline shift. 3. Left larger than right (6 mm in thickness on the left) cerebral convexity subdural collections, most likely subdural hematomas. Electronically Signed   By: Stevenson Elbe M.D.   On: 09/30/2023 19:55   EEG adult Result Date: 09/30/2023 Arleene Lack, MD     09/30/2023  4:49 PM Patient Name: SYLUS CHIARO MRN: [id number] Epilepsy Attending: Arleene Lack Referring Physician/Provider: Lena Qualia, MD Date: 09/30/2023 Duration: 25.54 mins Patient  history: 83yo M with left frontal extra axial lesion. EEG to evaluate for seizure Level of alertness: Awake AEDs during EEG study: None Technical aspects: This EEG study was done with scalp electrodes positioned according to the 10-20 International system of electrode placement. Electrical activity was reviewed with band pass filter of 1-70Hz , sensitivity of 7 uV/mm, display speed of 5mm/sec with a 60Hz  notched filter applied as appropriate. EEG data were recorded continuously and digitally stored.  Video monitoring was available and reviewed as appropriate. Description: The  posterior dominant rhythm consists of 9 Hz activity of moderate voltage (25-35 uV) seen predominantly in posterior head regions, symmetric and reactive to eye opening and eye closing. EEG showed continuous 3 to 6 Hz theta-delta slowing in left fronto-centro-temporal region. Hyperventilation and photic stimulation were not performed.   ABNORMALITY - Continuous slow, left fronto-centro-temporal region IMPRESSION: This study is suggestive of cortical dysfunction arising from left fronto-centro-temporal region likely secondary to underlying structural abnormality. No seizures or epileptiform discharges were seen throughout the recording. Priyanka Suzanne Erps   MR ANGIO HEAD WO CONTRAST Result Date: 09/30/2023 CLINICAL DATA:  Neuro deficit, concern for stroke. EXAM: MRA HEAD WITHOUT CONTRAST TECHNIQUE: Angiographic images of the Circle of Willis were acquired using MRA technique without intravenous contrast. COMPARISON:  Earlier same day CT head.  MRI head 09/04/2017. FINDINGS: Anterior circulation: The intracranial internal carotid arteries are patent and appear normal in caliber. No evidence of high-grade stenosis. The M1 segment of the right MCA is patent. Right MCA bifurcation is patent. Patent M2 branches of the right MCA. Distal right MCA branches are patent. The left M1 segment is patent. Normal appearance of the left MCA bifurcation. Left M2 branches are patent. Distal left MCA branches are patent. The A1 and A2 segments are patent bilaterally. Distal ACA branches are patent. There is subtle mass effect on A4 and A5 branches bilaterally due to large extra-axial mass over the left frontal lobe. Posterior circulation: The visualized intracranial vertebral arteries are patent. Right vertebral artery is dominant. The basilar artery is patent. Posterior communicating arteries noted bilaterally. The PCAs are patent bilaterally. The superior cerebellar arteries are patent bilaterally. AICA visualized on the right. PICA  visualized bilaterally, more pronounced on the left. Anatomic variants: None significant. Other: Redemonstrated large extra-axial mass overlying the left frontal lobe. Left subdural collection better evaluated on same day head CT. IMPRESSION: Patent intracranial arterial vasculature. Mild mass effect on A4 and A5 branches of the ACA is secondary to large extra-axial mass over the left frontal lobe. No high-grade stenosis. Electronically Signed   By: Denny Flack M.D.   On: 09/30/2023 13:24   CT HEAD WO CONTRAST Addendum Date: 09/30/2023 ADDENDUM REPORT: 09/30/2023 03:26 ADDENDUM: Findings discussed with Dr. Monique Ano via telephone at 3:26 a.m. Electronically Signed   By: Stevenson Elbe M.D.   On: 09/30/2023 03:26   Result Date: 09/30/2023 CLINICAL DATA:  Polytrauma, blunt; Polytrauma, penetrating EXAM: CT HEAD WITHOUT CONTRAST CT MAXILLOFACIAL WITHOUT CONTRAST CT CERVICAL SPINE WITHOUT CONTRAST TECHNIQUE: Multidetector CT imaging of the head, cervical spine, and maxillofacial structures were performed using the standard protocol without intravenous contrast. Multiplanar CT image reconstructions of the cervical spine and maxillofacial structures were also generated. RADIATION DOSE REDUCTION: This exam was performed according to the departmental dose-optimization program which includes automated exposure control, adjustment of the mA and/or kV according to patient size and/or use of iterative reconstruction technique. COMPARISON:  MRI September 04, 2017 FINDINGS: CT HEAD FINDINGS Brain: Large (approximately 6.5 cm) heterogeneous  and hemorrhagic extra-axial mass along the left frontal convexity with significant mass effect and approximately 1.5 cm of rightward midline shift. Approximately 7 mm thick predominately predominantly low-density subdural fluid collection along the left cerebral convexity. Small amount of hyperdensity within the collection. No evidence of acute large vascular territory infarct or hydrocephalus.  Vascular: Calcific atherosclerosis. Skull: No acute fracture. Other: No mastoid effusions. CT MAXILLOFACIAL FINDINGS Osseous: No fracture or mandibular dislocation. No destructive process. Orbits: Negative. No traumatic or inflammatory finding. Sinuses: Mostly clear. Soft tissues: Negative. CT CERVICAL SPINE FINDINGS Alignment: No substantial sagittal subluxation. Skull base and vertebrae: No acute fracture. Soft tissues and spinal canal: No prevertebral fluid or swelling. No visible canal hematoma. Disc levels:  Mild for age multilevel degenerative change. Upper chest: Clear sinuses. IMPRESSION: 1. Large (approximately 6.5 cm) heterogeneous and hemorrhagic extra-axial mass along the left frontal convexity with 1.5 cm of rightward midline shift, likely significant increase in size of the lesion seen on 2019 MRI. Recommend MRI head with contrast and neurosurgery consultation. 2. Approximately 7 mm thick predominately predominantly low-density subdural fluid collection along the left cerebral convexity, likely chronic hematoma. Small amount of hyperdensity within the collection is suggestive of acute/recent hemorrhage. 3. No evidence of acute fracture or traumatic malalignment in the cervical spine. Electronically Signed: By: Stevenson Elbe M.D. On: 09/30/2023 03:15   CT Maxillofacial Wo Contrast Addendum Date: 09/30/2023 ADDENDUM REPORT: 09/30/2023 03:26 ADDENDUM: Findings discussed with Dr. Monique Ano via telephone at 3:26 a.m. Electronically Signed   By: Stevenson Elbe M.D.   On: 09/30/2023 03:26   Result Date: 09/30/2023 CLINICAL DATA:  Polytrauma, blunt; Polytrauma, penetrating EXAM: CT HEAD WITHOUT CONTRAST CT MAXILLOFACIAL WITHOUT CONTRAST CT CERVICAL SPINE WITHOUT CONTRAST TECHNIQUE: Multidetector CT imaging of the head, cervical spine, and maxillofacial structures were performed using the standard protocol without intravenous contrast. Multiplanar CT image reconstructions of the cervical spine and  maxillofacial structures were also generated. RADIATION DOSE REDUCTION: This exam was performed according to the departmental dose-optimization program which includes automated exposure control, adjustment of the mA and/or kV according to patient size and/or use of iterative reconstruction technique. COMPARISON:  MRI September 04, 2017 FINDINGS: CT HEAD FINDINGS Brain: Large (approximately 6.5 cm) heterogeneous and hemorrhagic extra-axial mass along the left frontal convexity with significant mass effect and approximately 1.5 cm of rightward midline shift. Approximately 7 mm thick predominately predominantly low-density subdural fluid collection along the left cerebral convexity. Small amount of hyperdensity within the collection. No evidence of acute large vascular territory infarct or hydrocephalus. Vascular: Calcific atherosclerosis. Skull: No acute fracture. Other: No mastoid effusions. CT MAXILLOFACIAL FINDINGS Osseous: No fracture or mandibular dislocation. No destructive process. Orbits: Negative. No traumatic or inflammatory finding. Sinuses: Mostly clear. Soft tissues: Negative. CT CERVICAL SPINE FINDINGS Alignment: No substantial sagittal subluxation. Skull base and vertebrae: No acute fracture. Soft tissues and spinal canal: No prevertebral fluid or swelling. No visible canal hematoma. Disc levels:  Mild for age multilevel degenerative change. Upper chest: Clear sinuses. IMPRESSION: 1. Large (approximately 6.5 cm) heterogeneous and hemorrhagic extra-axial mass along the left frontal convexity with 1.5 cm of rightward midline shift, likely significant increase in size of the lesion seen on 2019 MRI. Recommend MRI head with contrast and neurosurgery consultation. 2. Approximately 7 mm thick predominately predominantly low-density subdural fluid collection along the left cerebral convexity, likely chronic hematoma. Small amount of hyperdensity within the collection is suggestive of acute/recent hemorrhage. 3. No  evidence of acute fracture or traumatic malalignment in the cervical spine.  Electronically Signed: By: Stevenson Elbe M.D. On: 09/30/2023 03:15   CT Cervical Spine Wo Contrast Addendum Date: 09/30/2023 ADDENDUM REPORT: 09/30/2023 03:26 ADDENDUM: Findings discussed with Dr. Monique Ano via telephone at 3:26 a.m. Electronically Signed   By: Stevenson Elbe M.D.   On: 09/30/2023 03:26   Result Date: 09/30/2023 CLINICAL DATA:  Polytrauma, blunt; Polytrauma, penetrating EXAM: CT HEAD WITHOUT CONTRAST CT MAXILLOFACIAL WITHOUT CONTRAST CT CERVICAL SPINE WITHOUT CONTRAST TECHNIQUE: Multidetector CT imaging of the head, cervical spine, and maxillofacial structures were performed using the standard protocol without intravenous contrast. Multiplanar CT image reconstructions of the cervical spine and maxillofacial structures were also generated. RADIATION DOSE REDUCTION: This exam was performed according to the departmental dose-optimization program which includes automated exposure control, adjustment of the mA and/or kV according to patient size and/or use of iterative reconstruction technique. COMPARISON:  MRI September 04, 2017 FINDINGS: CT HEAD FINDINGS Brain: Large (approximately 6.5 cm) heterogeneous and hemorrhagic extra-axial mass along the left frontal convexity with significant mass effect and approximately 1.5 cm of rightward midline shift. Approximately 7 mm thick predominately predominantly low-density subdural fluid collection along the left cerebral convexity. Small amount of hyperdensity within the collection. No evidence of acute large vascular territory infarct or hydrocephalus. Vascular: Calcific atherosclerosis. Skull: No acute fracture. Other: No mastoid effusions. CT MAXILLOFACIAL FINDINGS Osseous: No fracture or mandibular dislocation. No destructive process. Orbits: Negative. No traumatic or inflammatory finding. Sinuses: Mostly clear. Soft tissues: Negative. CT CERVICAL SPINE FINDINGS Alignment: No  substantial sagittal subluxation. Skull base and vertebrae: No acute fracture. Soft tissues and spinal canal: No prevertebral fluid or swelling. No visible canal hematoma. Disc levels:  Mild for age multilevel degenerative change. Upper chest: Clear sinuses. IMPRESSION: 1. Large (approximately 6.5 cm) heterogeneous and hemorrhagic extra-axial mass along the left frontal convexity with 1.5 cm of rightward midline shift, likely significant increase in size of the lesion seen on 2019 MRI. Recommend MRI head with contrast and neurosurgery consultation. 2. Approximately 7 mm thick predominately predominantly low-density subdural fluid collection along the left cerebral convexity, likely chronic hematoma. Small amount of hyperdensity within the collection is suggestive of acute/recent hemorrhage. 3. No evidence of acute fracture or traumatic malalignment in the cervical spine. Electronically Signed: By: Stevenson Elbe M.D. On: 09/30/2023 03:15    Microbiology: Results for orders placed or performed during the hospital encounter of 09/30/23  Surgical PCR screen     Status: None   Collection Time: 10/02/23  8:00 AM   Specimen: Nasal Mucosa; Nasal Swab  Result Value Ref Range Status   MRSA, PCR NEGATIVE NEGATIVE Final   Staphylococcus aureus NEGATIVE NEGATIVE Final    Comment: (NOTE) The Xpert SA Assay (FDA approved for NASAL specimens in patients 7 years of age and older), is one component of a comprehensive surveillance program. It is not intended to diagnose infection nor to guide or monitor treatment. Performed at [facility], [address], [place], [place] [address]   MRSA Next Gen by PCR, Nasal     Status: None   Collection Time: 10/03/23 12:49 PM   Specimen: Nasal Mucosa; Nasal Swab  Result Value Ref Range Status   MRSA by PCR Next Gen NOT DETECTED NOT DETECTED Final    Comment: (NOTE) The GeneXpert MRSA Assay (FDA approved for NASAL specimens only), is one component of a  comprehensive MRSA colonization surveillance program. It is not intended to diagnose MRSA infection nor to guide or monitor treatment for MRSA infections. Test performance is not  FDA approved in patients less than 79 years old. Performed at [facility], [address], [place], [place] [address]     Labs: CBC: Recent Labs  Lab 10/05/23 [phone] 10/06/23 0644 10/07/23 0622 10/08/23 0559 10/09/23 0537  WBC 8.9 7.4 10.9* 9.6 8.6  NEUTROABS  --   --   --   --  6.7  HGB 9.3* 10.0* 10.1* 10.2* 10.7*  HCT 27.0* 30.2* 29.3* 29.1* 30.7*  MCV 87.9 89.6 87.2 86.6 87.2  PLT 162 169 192 198 221   Basic Metabolic Panel: Recent Labs  Lab 10/05/23 0610 10/06/23 0644 10/07/23 0622 10/08/23 0559 10/09/23 0537  NA 134* 132* 131* 130* 132*  K 3.9 4.1 4.1 4.1 4.2  CL 102 100 101 100 99  CO2 24 24 25 22 24   GLUCOSE 118* 125* 115* 117* 118*  BUN 19 19 19 19 20   CREATININE 0.69 0.70 0.76 0.73 0.81  CALCIUM  8.2* 8.3* 8.3* 8.4* 8.5*  MG 2.1  --   --   --   --    Liver Function Tests: No results for input(s): "AST", "ALT", "ALKPHOS", "BILITOT", "PROT", "ALBUMIN" in the last 168 hours. CBG: No results for input(s): "GLUCAP" in the last 168 hours.  Discharge time spent: 35 minutes.  Signed: Jodeane Mulligan, DO [facility] 10/09/2023

## 2023-10-09 NOTE — H&P (Signed)
 Physical Medicine and Rehabilitation Admission H&P    Chief Complaint  Patient presents with   Functional decline.    weakness    HPI:  Jesse [name] is an 83 year old male with history of A fib, RLS, dementia, Prostate CA, meningioma dx 2016 (asymptomatic and had refused excision) who was admitted to [facility] on 09/30/23 with one week history of progressive weakness with cognitive change and unwitnessed fall. He was found to have large heterogenous and hemorrhagic extra axial mass along left frontal convexity with 1.5 cm rightward midline shift and 7 mm subdural hemorrhage felt to be acute on chronic. MRA brain showed patent intracranial arterial vasculature with mild mass effect on A4 and A5 braches of ACA secondary to large mass and MRI showed 6.1 cm extra-axial mass most likely aggressive meningioma as well as L>R SDH.  Dr. Ellery Guthrie consulted and patient agreeable to undergo surgical intervention. Palliative care consulted and he elected on full scope of care.      ROS   Past Medical History:  Diagnosis Date   Arthritis    Atrial fibrillation (HCC)    Complication of anesthesia    Atrial fibrillation during surgery    Dementia (HCC)    Depression    Hard of hearing    Hx of constipation    Osteoporosis    DEXA 11/09/10. Completed treatemtn with Fosamax.   Prostate cancer (HCC)    Restless legs    Thyroid  disease    hypothyroidism   Vitiligo     Past Surgical History:  Procedure Laterality Date   BACK SURGERY     COLONOSCOPY     CRANIOTOMY Left 10/03/2023   Procedure: CRANIOTOMY TUMOR EXCISION LEFT FRONTAL;  Surgeon: Elna Haggis, MD;  Location: [facility] OR;  Service: Neurosurgery;  Laterality: Left;  Left Frontal Craniotomy for Menigioma   CRANIOTOMY N/A 10/04/2023   Procedure: CRANIOTOMY HEMATOMA EVACUATION SUBDURAL;  Surgeon: Elna Haggis, MD;  Location: [facility] OR;  Service: Neurosurgery;  Laterality: N/A;  REPEAT   FLEXIBLE SIGMOIDOSCOPY N/A 09/27/2017   Procedure: FLEXIBLE  SIGMOIDOSCOPY;  Surgeon: Kenney Peacemaker, MD;  Location: [facility] ENDOSCOPY;  Service: Endoscopy;  Laterality: N/A;   JOINT REPLACEMENT     LAMINECTOMY  10/2011   cervical and lumbar laminectomy   PROSTATE BIOPSY  07/10/2011   PROSTATE BIOPSY  09/22/15   TOTAL HIP ARTHROPLASTY Right 02/29/2012   TOTAL HIP ARTHROPLASTY Right 07/04/2016   Procedure: RIGHT TOTAL HIP ARTHROPLASTY ANTERIOR APPROACH;  Surgeon: Adonica Hoose, MD;  Location: [facility] OR;  Service: Orthopedics;  Laterality: Right;   TRANSURETHRAL RESECTION OF PROSTATE     Family History  Problem Relation Age of Onset   Atrial fibrillation Mother    Cystic fibrosis Mother    Atrial fibrillation Sister    Cancer Neg Hx    Esophageal cancer Neg Hx    Colon cancer Neg Hx    Rectal cancer Neg Hx    Stomach cancer Neg Hx     Social History:  reports that he quit smoking about 49 years ago. His smoking use included cigarettes. He has never used smokeless tobacco. He reports current alcohol use. He reports that he does not use drugs.   Allergies: No Known Allergies   Medications Prior to Admission  Medication Sig Dispense Refill   Cyanocobalamin  (VITAMIN B 12 PO) Take 1,000 Units by mouth 3 (three) times a week.      SYNTHROID  50 MCG tablet TAKE 1 TABLET DAILY BEFORE BREAKFAST 90  tablet 2      Home: Home Living Family/patient expects to be discharged to:: Private residence Living Arrangements: Spouse/significant other Available Help at Discharge: Family Type of Home: House Home Access: Level entry Home Layout: Two level, 1/2 bath on main level, Able to live on main level with bedroom/bathroom Bathroom Shower/Tub: Walk-in shower (upstairs) Bathroom Toilet: Handicapped height Home Equipment: Agricultural consultant (2 wheels), BSC/3in1, Wheelchair - manual   Functional History: Prior Function Prior Level of Function : Needs assist Mobility Comments: per wife, pt using RW in the home ADLs Comments: per pt report requires assist from spouse  for ADLs recently  Functional Status:  Mobility: Bed Mobility Overal bed mobility: Needs Assistance Bed Mobility: Supine to Sit Supine to sit: Supervision Sit to supine: Mod assist, HOB elevated, Used rails General bed mobility comments: no physical assistance provided. Transfers Overall transfer level: Needs assistance Equipment used: Rolling walker (2 wheels) Transfers: Sit to/from Stand Sit to Stand: Min assist, +2 safety/equipment Bed to/from chair/wheelchair/BSC transfer type:: Step pivot Step pivot transfers: Mod assist General transfer comment: cues for hand placement, light assist for balance on rise Ambulation/Gait Ambulation/Gait assistance: Min assist, +2 safety/equipment Gait Distance (Feet): 20 Feet Assistive device: Rolling walker (2 wheels) Gait Pattern/deviations: Step-to pattern, Decreased stride length General Gait Details: Pt able to ambulate in room with RW Min A. +2 for safety. no LOB noted. Able to stand at sink with OT for ~5 minutes. Gait velocity: decreased Gait velocity interpretation: <1.31 ft/sec, indicative of household ambulator Stairs: Yes Stairs assistance: Min assist Stair Management: Two rails, Alternating pattern, Step to pattern, Forwards Number of Stairs: 2 General stair comments: pt performed alternating gait pattern ascending the stairs and step to gait pattern descending the stairs.    ADL: ADL Overall ADL's : Needs assistance/impaired Eating/Feeding: Set up, Sitting Grooming: Minimal assistance, Standing Grooming Details (indicate cue type and reason): intermittent min A for balance secondary to R lateral lean Upper Body Bathing: Contact guard assist, Sitting Lower Body Bathing: Sitting/lateral leans, Minimal assistance Upper Body Dressing : Sitting, Set up Lower Body Dressing: Sit to/from stand, Minimal assistance Toilet Transfer: Minimal assistance, Ambulation, Rolling walker (2 wheels) Toilet Transfer Details (indicate cue type  and reason): min A for balance Toileting- Clothing Manipulation and Hygiene: Sit to/from stand, Moderate assistance Functional mobility during ADLs: Minimal assistance, Rolling walker (2 wheels)  Cognition: Cognition Orientation Level: Oriented X4 Cognition Arousal: Alert Behavior During Therapy: Flat affect   Blood pressure 124/71, pulse 66, temperature 97.6 F (36.4 C), temperature source Oral, resp. rate 18, height 5\' 11"  (1.803 m), weight 74.7 kg, SpO2 99%. Physical Exam  Results for orders placed or performed during the hospital encounter of 09/30/23 (from the past 48 hours)  CBC     Status: Abnormal   Collection Time: 10/08/23  5:59 AM  Result Value Ref Range   WBC 9.6 4.0 - 10.5 K/uL   RBC 3.36 (L) 4.22 - 5.81 MIL/uL   Hemoglobin 10.2 (L) 13.0 - 17.0 g/dL   HCT 21.3 (L) 08.6 - 57.8 %   MCV 86.6 80.0 - 100.0 fL   MCH 30.4 26.0 - 34.0 pg   MCHC 35.1 30.0 - 36.0 g/dL   RDW 46.9 62.9 - 52.8 %   Platelets 198 150 - 400 K/uL   nRBC 0.0 0.0 - 0.2 %    Comment: Performed at [facility], [address], [place], [place] [address]  Basic metabolic panel with GFR     Status: Abnormal  Collection Time: 10/08/23  5:59 AM  Result Value Ref Range   Sodium 130 (L) 135 - 145 mmol/L   Potassium 4.1 3.5 - 5.1 mmol/L   Chloride 100 98 - 111 mmol/L   CO2 22 22 - 32 mmol/L   Glucose, Bld 117 (H) 70 - 99 mg/dL    Comment: Glucose reference range applies only to samples taken after fasting for at least 8 hours.   BUN 19 8 - 23 mg/dL   Creatinine, Ser 1.47 0.61 - 1.24 mg/dL   Calcium  8.4 (L) 8.9 - 10.3 mg/dL   GFR, Estimated >82 >95 mL/min    Comment: (NOTE) Calculated using the CKD-EPI Creatinine Equation (2021)    Anion gap 8 5 - 15    Comment: Performed at [facility], [address] [place], [place] [address]  Basic metabolic panel with GFR     Status: Abnormal   Collection Time: 10/09/23  5:37 AM  Result Value Ref Range   Sodium 132 (L) 135 - 145 mmol/L    Potassium 4.2 3.5 - 5.1 mmol/L   Chloride 99 98 - 111 mmol/L   CO2 24 22 - 32 mmol/L   Glucose, Bld 118 (H) 70 - 99 mg/dL    Comment: Glucose reference range applies only to samples taken after fasting for at least 8 hours.   BUN 20 8 - 23 mg/dL   Creatinine, Ser 8.65 0.61 - 1.24 mg/dL   Calcium  8.5 (L) 8.9 - 10.3 mg/dL   GFR, Estimated >78 >46 mL/min    Comment: (NOTE) Calculated using the CKD-EPI Creatinine Equation (2021)    Anion gap 9 5 - 15    Comment: Performed at [facility], [address], [place], [place] [address]  CBC with Differential/Platelet     Status: Abnormal   Collection Time: 10/09/23  5:37 AM  Result Value Ref Range   WBC 8.6 4.0 - 10.5 K/uL   RBC 3.52 (L) 4.22 - 5.81 MIL/uL   Hemoglobin 10.7 (L) 13.0 - 17.0 g/dL   HCT 28.4 (L) 13.2 - 44.0 %   MCV 87.2 80.0 - 100.0 fL   MCH 30.4 26.0 - 34.0 pg   MCHC 34.9 30.0 - 36.0 g/dL   RDW 10.2 72.5 - 36.6 %   Platelets 221 150 - 400 K/uL   nRBC 0.0 0.0 - 0.2 %   Neutrophils Relative % 78 %   Neutro Abs 6.7 1.7 - 7.7 K/uL   Lymphocytes Relative 9 %   Lymphs Abs 0.8 0.7 - 4.0 K/uL   Monocytes Relative 10 %   Monocytes Absolute 0.9 0.1 - 1.0 K/uL   Eosinophils Relative 0 %   Eosinophils Absolute 0.0 0.0 - 0.5 K/uL   Basophils Relative 0 %   Basophils Absolute 0.0 0.0 - 0.1 K/uL   Immature Granulocytes 3 %   Abs Immature Granulocytes 0.22 (H) 0.00 - 0.07 K/uL    Comment: Performed at [facility], [address] [place], [place] [address]   CT HEAD WO CONTRAST ( ) Result Date: 10/07/2023 CLINICAL DATA:  Provided history: Check resolution of shift. EXAM: CT HEAD WITHOUT CONTRAST TECHNIQUE: Contiguous axial images were obtained from the base of the skull through the vertex without intravenous contrast. RADIATION DOSE REDUCTION: This exam was performed according to the departmental dose-optimization program which includes automated exposure control, adjustment of the mA and/or kV according to patient size  and/or use of iterative reconstruction technique. COMPARISON:  Prior head  CT examinations 10/04/2023 and earlier. Brain MRI 09/30/2023. FINDINGS: Brain: Continued interval evolution of postoperative changes from recent prior resection of a left frontal mass and from subsequent left frontal parenchymal hematoma evacuation. Small patchy foci of parenchymal hemorrhage within the left frontal lobe (at site of prior left frontal mass resection) are non-progressed. Edema at this site has slightly progressed. Pneumocephalus persistent although decreased pneumocephalus scattered along the left greater than right frontal lobes. Residual mixed density subdural hematoma along the left cerebral convexity, measuring up to 11 mm in thickness (for instance as seen on series 5, image 50). Subdural hemorrhage along the right cerebral convexity measuring up to 3 mm in thickness, present on the prior head CT of 10/04/2023 but better appreciated on today's study (for instance as seen on series 5, image 25). Persistent mass effect with partial effacement of the left lateral ventricle. Rightward midline shift now measures 7-8 mm (previously 9 mm). Persistent trace hemorrhage within the occipital horn of the right lateral ventricle. Unchanged size and configuration of the ventricular system. Subdural hemorrhage along the mid and posterior falx, measuring up to 3 mm in thickness, similar to the prior CT. Vascular: No hyperdense vessel.  Atherosclerotic calcifications. Skull: Left-sided cranioplasty. Sinuses/Orbits: No orbital mass or acute orbital finding. No significant paranasal sinus disease. IMPRESSION: 1. Continued interval evolution of postoperative changes from recent left frontal mass resection, and subsequent left frontal parenchymal hematoma evacuation. 2. Patchy foci of parenchymal hemorrhage within the left frontal lobe, non-progressed. Surrounding parenchymal edema has slightly progressed. 3. Residual mixed density subdural  hematoma along the left cerebral convexity, measuring up to 11 mm in thickness. 4. Persistent mass effect with partial effacement of the left lateral ventricle. Decreased rightward midline shift now measuring 7-8 mm (previously 9 mm). 5. Thin subdural hematoma along the right cerebral convexity (measuring up to 3 mm in thickness), non-progressed. 6. Trace hemorrhage within the right lateral ventricle occipital horn, unchanged. 7. Subdural hemorrhage along the mid and posterior falx (measuring up to 3 mm in thickness), also not significantly changed. Electronically Signed   By: Bascom Lily D.O.   On: 10/07/2023 15:01      Blood pressure 124/71, pulse 66, temperature 97.6 F (36.4 C), temperature source Oral, resp. rate 18, height 5\' 11"  (1.803 m), weight 74.7 kg, SpO2 99%.  Medical Problem List and Plan: 1. Functional deficits secondary to ***  -patient may *** shower  -ELOS/Goals: *** 2.  Antithrombotics: -DVT/anticoagulation:  {VTE PROPHYLAXIS/ANTICOAGULATION - [id number]}  -antiplatelet therapy: *** 3. Pain Management: *** 4. Mood/Behavior/Sleep: ***  -antipsychotic agents: *** 5. Neuropsych/cognition: This patient *** capable of making decisions on *** own behalf. 6. Skin/Wound Care: *** 7. Fluids/Electrolytes/Nutrition: ***   Ileus/sigmoid volvulus:  Hyponatremia:  Acute blood loss anemia:    ***  Zelda Hickman, PA-C 10/09/2023

## 2023-10-09 NOTE — Progress Notes (Signed)
 Inpatient Rehab Admissions Coordinator:   Rapid response/code stroke called due to change in status.  Hold CIR admit today.  We will follow.   Loye Rumble, PT, DPT Admissions Coordinator [phone] 10/09/23  12:31 PM

## 2023-10-09 NOTE — TOC Transition Note (Signed)
 Transition of Care [facility]) - Discharge Note   Patient Details  Name: Jesse [name] MRN: [id number] Date of Birth: 22-Mar-1941  Transition of Care [facility]) CM/SW Contact:  Tandy Fam, LCSW Phone Number: 10/09/2023, 10:50 AM   Clinical Narrative:   CSW notified by Rehab Admissions that patient has bed available on CIR today. No further TOC needs.    Final next level of care: IP Rehab Facility Barriers to Discharge: Barriers Resolved   Patient Goals and CMS Choice            Discharge Placement                       Discharge Plan and Services Additional resources added to the After Visit Summary for                                       Social Drivers of Health (SDOH) Interventions SDOH Screenings   Food Insecurity: No Food Insecurity (10/03/2023)  Housing: Low Risk  (10/03/2023)  Transportation Needs: No Transportation Needs (10/03/2023)  Utilities: Not At Risk (10/03/2023)  Depression (PHQ2-9): Low Risk  (08/26/2020)  Financial Resource Strain: Low Risk  (08/14/2022)   Received from [facility], [facility]  Physical Activity: Sufficiently Active (03/12/2022)   Received from [facility], [facility]  Social Connections: Moderately Isolated (10/03/2023)  Stress: No Stress Concern Present (05/17/2022)   Received from [facility], [facility]  Tobacco Use: Medium Risk (10/03/2023)     Readmission Risk Interventions     No data to display

## 2023-10-09 NOTE — Procedures (Addendum)
 Patient Name: Jesse [name]  MRN: [id number]  Epilepsy Attending: Arleene Lack  Referring Physician/Provider: Imogene Mana, NP  Date: 10/09/2023 Duration: 22.16 mins  Patient history: 82yo m with ams. EEG to evaluate for seizure  Level of alertness: Awake  AEDs during EEG study: LEV  Technical aspects: This EEG study was done with scalp electrodes positioned according to the 10-20 International system of electrode placement. Electrical activity was reviewed with band pass filter of 1-70Hz , sensitivity of 7 uV/mm, display speed of 42mm/sec with a 60Hz  notched filter applied as appropriate. EEG data were recorded continuously and digitally stored.  Video monitoring was available and reviewed as appropriate.  Description: The posterior dominant rhythm consists of 8 Hz activity of moderate voltage (25-35 uV) seen predominantly in posterior head regions, asymmetric ( left<right) and reactive to eye opening and eye closing. EEG showed continuous 3 to 6 Hz theta-delta slowing admixed with 12-14hz  beta activity in left hemisphere. Hyperventilation and photic stimulation were not performed.     ABNORMALITY - Continuous slow, left hemisphere   IMPRESSION: This study is suggestive of cortical dysfunction arising from left hemisphere likely secondary to underlying structural abnormality/ SDH. No seizures or epileptiform discharges were seen throughout the recording.  Shadrick Senne O Krista Godsil

## 2023-10-09 NOTE — Progress Notes (Signed)
 Inpatient Rehab Admissions Coordinator:   I have a bed for this patient to admit to CIR today.  TOC aware and Dr. Macarthur Savory in agreement.  I've notified pt/family.  I will make arrangements to admit today.   Loye Rumble, PT, DPT Admissions Coordinator [phone] 10/09/23  10:48 AM

## 2023-10-09 NOTE — Progress Notes (Signed)
 Rapid response was called due to altered mental status.  Per report, patient had just had a large bowel movement, was laying in bed and suddenly became altered, aphasic, not following commands.  Stark contrast earlier this morning was ANO x 4.  Systolic blood pressure was measured and immediately noted to be in the 80s.  Glucose greater than 100.  Upon initial assessment, patient was laying flat in bed, appeared alert, responsive but definitely had expressive aphasia and word finding difficulties which were new compared to earlier visit.  Muscle strength 5 out of 5 in all 4 extremities with mild left facial droop similar to previous visit.  Patient was able to follow commands.  Blood pressure is improved after patient laying flat with 500 mL bolus running, systolic blood pressure greater than 100.  Patient was subsequently evaluated by code stroke team as well as neurology.  Will go for stat CT at this time.  Discussed the findings and plan with patient's wife who is at bedside.  She agrees with the plan.  Patient was to be discharged to [facility] today however will rescinded on discharge pending workup.  Critical care time 31 minutes

## 2023-10-10 ENCOUNTER — Other Ambulatory Visit: Payer: Self-pay

## 2023-10-10 ENCOUNTER — Inpatient Hospital Stay (HOSPITAL_COMMUNITY)
Admission: AD | Admit: 2023-10-10 | Discharge: 2023-10-22 | DRG: 945 | Disposition: A | Discharge status: Home / Self Care | Source: Intra-hospital | Attending: Physical Medicine and Rehabilitation | Admitting: Physical Medicine and Rehabilitation

## 2023-10-10 ENCOUNTER — Inpatient Hospital Stay (HOSPITAL_COMMUNITY)

## 2023-10-10 ENCOUNTER — Encounter (HOSPITAL_COMMUNITY): Payer: Self-pay | Admitting: Physical Medicine and Rehabilitation

## 2023-10-10 DIAGNOSIS — T83518A Infection and inflammatory reaction due to other urinary catheter, initial encounter: Secondary | ICD-10-CM | POA: Diagnosis not present

## 2023-10-10 DIAGNOSIS — Z8349 Family history of other endocrine, nutritional and metabolic diseases: Secondary | ICD-10-CM

## 2023-10-10 DIAGNOSIS — I480 Paroxysmal atrial fibrillation: Secondary | ICD-10-CM | POA: Diagnosis present

## 2023-10-10 DIAGNOSIS — S065XAA Traumatic subdural hemorrhage with loss of consciousness status unknown, initial encounter: Secondary | ICD-10-CM | POA: Diagnosis not present

## 2023-10-10 DIAGNOSIS — Z86018 Personal history of other benign neoplasm: Secondary | ICD-10-CM

## 2023-10-10 DIAGNOSIS — Z9889 Other specified postprocedural states: Secondary | ICD-10-CM | POA: Diagnosis not present

## 2023-10-10 DIAGNOSIS — D329 Benign neoplasm of meninges, unspecified: Secondary | ICD-10-CM | POA: Diagnosis present

## 2023-10-10 DIAGNOSIS — Z8546 Personal history of malignant neoplasm of prostate: Secondary | ICD-10-CM | POA: Diagnosis not present

## 2023-10-10 DIAGNOSIS — E222 Syndrome of inappropriate secretion of antidiuretic hormone: Secondary | ICD-10-CM | POA: Diagnosis present

## 2023-10-10 DIAGNOSIS — N3289 Other specified disorders of bladder: Secondary | ICD-10-CM | POA: Diagnosis present

## 2023-10-10 DIAGNOSIS — F0390 Unspecified dementia without behavioral disturbance: Secondary | ICD-10-CM | POA: Diagnosis present

## 2023-10-10 DIAGNOSIS — Z888 Allergy status to other drugs, medicaments and biological substances status: Secondary | ICD-10-CM

## 2023-10-10 DIAGNOSIS — K567 Ileus, unspecified: Secondary | ICD-10-CM | POA: Diagnosis not present

## 2023-10-10 DIAGNOSIS — W010XXA Fall on same level from slipping, tripping and stumbling without subsequent striking against object, initial encounter: Secondary | ICD-10-CM | POA: Diagnosis not present

## 2023-10-10 DIAGNOSIS — S065XAD Traumatic subdural hemorrhage with loss of consciousness status unknown, subsequent encounter: Secondary | ICD-10-CM | POA: Diagnosis not present

## 2023-10-10 DIAGNOSIS — I951 Orthostatic hypotension: Secondary | ICD-10-CM | POA: Diagnosis present

## 2023-10-10 DIAGNOSIS — D62 Acute posthemorrhagic anemia: Secondary | ICD-10-CM | POA: Diagnosis present

## 2023-10-10 DIAGNOSIS — R4701 Aphasia: Secondary | ICD-10-CM | POA: Diagnosis present

## 2023-10-10 DIAGNOSIS — G2581 Restless legs syndrome: Secondary | ICD-10-CM | POA: Diagnosis present

## 2023-10-10 DIAGNOSIS — R569 Unspecified convulsions: Secondary | ICD-10-CM | POA: Diagnosis present

## 2023-10-10 DIAGNOSIS — Y846 Urinary catheterization as the cause of abnormal reaction of the patient, or of later complication, without mention of misadventure at the time of the procedure: Secondary | ICD-10-CM | POA: Diagnosis present

## 2023-10-10 DIAGNOSIS — Z79899 Other long term (current) drug therapy: Secondary | ICD-10-CM

## 2023-10-10 DIAGNOSIS — R4189 Other symptoms and signs involving cognitive functions and awareness: Secondary | ICD-10-CM | POA: Diagnosis present

## 2023-10-10 DIAGNOSIS — M810 Age-related osteoporosis without current pathological fracture: Secondary | ICD-10-CM | POA: Diagnosis present

## 2023-10-10 DIAGNOSIS — Z79890 Hormone replacement therapy (postmenopausal): Secondary | ICD-10-CM | POA: Diagnosis not present

## 2023-10-10 DIAGNOSIS — R5381 Other malaise: Secondary | ICD-10-CM | POA: Diagnosis present

## 2023-10-10 DIAGNOSIS — K5909 Other constipation: Secondary | ICD-10-CM | POA: Diagnosis present

## 2023-10-10 DIAGNOSIS — E871 Hypo-osmolality and hyponatremia: Secondary | ICD-10-CM | POA: Diagnosis not present

## 2023-10-10 DIAGNOSIS — Z87891 Personal history of nicotine dependence: Secondary | ICD-10-CM | POA: Diagnosis not present

## 2023-10-10 DIAGNOSIS — Z96641 Presence of right artificial hip joint: Secondary | ICD-10-CM | POA: Diagnosis present

## 2023-10-10 DIAGNOSIS — K560 Paralytic ileus: Secondary | ICD-10-CM | POA: Diagnosis present

## 2023-10-10 DIAGNOSIS — N390 Urinary tract infection, site not specified: Secondary | ICD-10-CM | POA: Diagnosis not present

## 2023-10-10 DIAGNOSIS — R339 Retention of urine, unspecified: Secondary | ICD-10-CM | POA: Diagnosis not present

## 2023-10-10 DIAGNOSIS — I629 Nontraumatic intracranial hemorrhage, unspecified: Secondary | ICD-10-CM

## 2023-10-10 DIAGNOSIS — E039 Hypothyroidism, unspecified: Secondary | ICD-10-CM | POA: Diagnosis present

## 2023-10-10 DIAGNOSIS — Z9079 Acquired absence of other genital organ(s): Secondary | ICD-10-CM

## 2023-10-10 DIAGNOSIS — R55 Syncope and collapse: Secondary | ICD-10-CM | POA: Diagnosis not present

## 2023-10-10 DIAGNOSIS — R338 Other retention of urine: Secondary | ICD-10-CM

## 2023-10-10 LAB — CBC WITH DIFFERENTIAL/PLATELET
Abs Immature Granulocytes: 0.16 10*3/uL — ABNORMAL HIGH (ref 0.00–0.07)
Basophils Absolute: 0 10*3/uL (ref 0.0–0.1)
Basophils Relative: 0 %
Eosinophils Absolute: 0 10*3/uL (ref 0.0–0.5)
Eosinophils Relative: 0 %
HCT: 30.2 % — ABNORMAL LOW (ref 39.0–52.0)
Hemoglobin: 10.3 g/dL — ABNORMAL LOW (ref 13.0–17.0)
Immature Granulocytes: 2 %
Lymphocytes Relative: 9 %
Lymphs Abs: 0.8 10*3/uL (ref 0.7–4.0)
MCH: 30.2 pg (ref 26.0–34.0)
MCHC: 34.1 g/dL (ref 30.0–36.0)
MCV: 88.6 fL (ref 80.0–100.0)
Monocytes Absolute: 0.9 10*3/uL (ref 0.1–1.0)
Monocytes Relative: 10 %
Neutro Abs: 7 10*3/uL (ref 1.7–7.7)
Neutrophils Relative %: 79 %
Platelets: 217 10*3/uL (ref 150–400)
RBC: 3.41 MIL/uL — ABNORMAL LOW (ref 4.22–5.81)
RDW: 13.4 % (ref 11.5–15.5)
WBC: 8.9 10*3/uL (ref 4.0–10.5)
nRBC: 0 % (ref 0.0–0.2)

## 2023-10-10 LAB — BASIC METABOLIC PANEL WITH GFR
Anion gap: 9 (ref 5–15)
BUN: 20 mg/dL (ref 8–23)
CO2: 23 mmol/L (ref 22–32)
Calcium: 8.4 mg/dL — ABNORMAL LOW (ref 8.9–10.3)
Chloride: 100 mmol/L (ref 98–111)
Creatinine, Ser: 0.88 mg/dL (ref 0.61–1.24)
GFR, Estimated: >60 mL/min (ref >60)
Glucose, Bld: 110 mg/dL — ABNORMAL HIGH (ref 70–99)
Potassium: 4.8 mmol/L (ref 3.5–5.1)
Sodium: 132 mmol/L — ABNORMAL LOW (ref 135–145)

## 2023-10-10 MED ORDER — LEVETIRACETAM 750 MG PO TABS
750.0000 mg | ORAL_TABLET | Freq: Two times a day (BID) | ORAL | Status: DC
  Administered 2023-10-10 – 2023-10-11 (×2): 750 mg via ORAL
  Filled 2023-10-10 (×2): qty 1

## 2023-10-10 MED ORDER — OXYCODONE HCL 5 MG PO TABS: 5.0000 mg | ORAL_TABLET | Freq: Four times a day (QID) | ORAL | Status: DC | PRN

## 2023-10-10 MED ORDER — SENNOSIDES-DOCUSATE SODIUM 8.6-50 MG PO TABS
2.0000 | ORAL_TABLET | Freq: Every day | ORAL | Status: DC
  Administered 2023-10-11 – 2023-10-22 (×12): 2 via ORAL
  Filled 2023-10-10 (×12): qty 2

## 2023-10-10 MED ORDER — PROCHLORPERAZINE 25 MG RE SUPP: 12.5000 mg | Freq: Four times a day (QID) | RECTAL | Status: DC | PRN

## 2023-10-10 MED ORDER — GUAIFENESIN-DM 100-10 MG/5ML PO SYRP: 5.0000 mL | ORAL_SOLUTION | Freq: Four times a day (QID) | ORAL | Status: DC | PRN

## 2023-10-10 MED ORDER — DIPHENHYDRAMINE HCL 25 MG PO CAPS: 25.0000 mg | ORAL_CAPSULE | Freq: Four times a day (QID) | ORAL | Status: DC | PRN

## 2023-10-10 MED ORDER — PROCHLORPERAZINE MALEATE 5 MG PO TABS: 5.0000 mg | ORAL_TABLET | Freq: Four times a day (QID) | ORAL | Status: DC | PRN

## 2023-10-10 MED ORDER — TAMSULOSIN HCL 0.4 MG PO CAPS: 0.4000 mg | ORAL_CAPSULE | Freq: Every day | ORAL | Status: DC

## 2023-10-10 MED ORDER — PANTOPRAZOLE SODIUM 40 MG PO TBEC
40.0000 mg | DELAYED_RELEASE_TABLET | Freq: Every day | ORAL | Status: DC
  Administered 2023-10-10 – 2023-10-13 (×4): 40 mg via ORAL
  Filled 2023-10-10 (×4): qty 1

## 2023-10-10 MED ORDER — LIDOCAINE HCL URETHRAL/MUCOSAL 2 % EX GEL
CUTANEOUS | Status: DC | PRN
  Filled 2023-10-10: qty 6

## 2023-10-10 MED ORDER — ENOXAPARIN SODIUM 40 MG/0.4ML IJ SOSY
40.0000 mg | PREFILLED_SYRINGE | INTRAMUSCULAR | Status: DC
  Administered 2023-10-10 – 2023-10-22 (×13): 40 mg via SUBCUTANEOUS
  Filled 2023-10-10 (×13): qty 0.4

## 2023-10-10 MED ORDER — PROCHLORPERAZINE EDISYLATE 10 MG/2ML IJ SOLN: 5.0000 mg | Freq: Four times a day (QID) | INTRAMUSCULAR | Status: DC | PRN

## 2023-10-10 MED ORDER — MELATONIN 5 MG PO TABS: 5.0000 mg | ORAL_TABLET | Freq: Every evening | ORAL | Status: DC | PRN

## 2023-10-10 MED ORDER — METOCLOPRAMIDE HCL 5 MG/ML IJ SOLN
5.0000 mg | Freq: Four times a day (QID) | INTRAMUSCULAR | Status: DC
Start: 2023-10-10 — End: 2023-10-10

## 2023-10-10 MED ORDER — DEXAMETHASONE 0.5 MG PO TABS
2.0000 mg | ORAL_TABLET | Freq: Two times a day (BID) | ORAL | Status: DC
Start: 2023-10-10 — End: 2023-10-15
  Administered 2023-10-10 – 2023-10-14 (×9): 2 mg via ORAL
  Filled 2023-10-10 (×9): qty 4

## 2023-10-10 MED ORDER — FINASTERIDE 5 MG PO TABS
5.0000 mg | ORAL_TABLET | Freq: Every day | ORAL | Status: DC
  Administered 2023-10-11 – 2023-10-14 (×4): 5 mg via ORAL
  Filled 2023-10-10 (×4): qty 1

## 2023-10-10 MED ORDER — SODIUM CHLORIDE 0.9% FLUSH
3.0000 mL | Freq: Two times a day (BID) | INTRAVENOUS | Status: DC
  Administered 2023-10-11 – 2023-10-13 (×5): 10 mL via INTRAVENOUS
  Administered 2023-10-13: 3 mL via INTRAVENOUS
  Administered 2023-10-14 – 2023-10-17 (×7): 10 mL via INTRAVENOUS
  Administered 2023-10-17: 3 mL via INTRAVENOUS
  Administered 2023-10-18 – 2023-10-20 (×4): 10 mL via INTRAVENOUS
  Administered 2023-10-20 – 2023-10-21 (×2): 3 mL via INTRAVENOUS
  Administered 2023-10-21 – 2023-10-22 (×2): 10 mL via INTRAVENOUS

## 2023-10-10 MED ORDER — CHLORHEXIDINE GLUCONATE CLOTH 2 % EX PADS
6.0000 | MEDICATED_PAD | Freq: Every day | CUTANEOUS | Status: DC
  Administered 2023-10-11: 6 via TOPICAL

## 2023-10-10 MED ORDER — LEVOTHYROXINE SODIUM 50 MCG PO TABS
50.0000 ug | ORAL_TABLET | Freq: Every day | ORAL | Status: DC
  Administered 2023-10-11 – 2023-10-22 (×12): 50 ug via ORAL
  Filled 2023-10-10 (×12): qty 1

## 2023-10-10 MED ORDER — METOCLOPRAMIDE HCL 5 MG/ML IJ SOLN
10.0000 mg | Freq: Four times a day (QID) | INTRAMUSCULAR | Status: DC
  Administered 2023-10-10 – 2023-10-13 (×10): 10 mg via INTRAVENOUS
  Filled 2023-10-10 (×12): qty 2

## 2023-10-10 MED ORDER — LEVETIRACETAM 750 MG PO TABS: 750.0000 mg | ORAL_TABLET | Freq: Two times a day (BID) | ORAL | Status: DC

## 2023-10-10 MED ORDER — FLEET ENEMA RE ENEM: 1.0000 | ENEMA | Freq: Once | RECTAL | Status: DC | PRN

## 2023-10-10 MED ORDER — BISACODYL 10 MG RE SUPP: 10.0000 mg | Freq: Every day | RECTAL | Status: DC | PRN

## 2023-10-10 MED ORDER — ALUM & MAG HYDROXIDE-SIMETH 200-200-20 MG/5ML PO SUSP: 30.0000 mL | ORAL | Status: DC | PRN

## 2023-10-10 MED ORDER — DEXAMETHASONE SODIUM PHOSPHATE 4 MG/ML IJ SOLN
2.0000 mg | Freq: Two times a day (BID) | INTRAMUSCULAR | Status: DC
  Filled 2023-10-10: qty 1

## 2023-10-10 MED ORDER — ACETAMINOPHEN 325 MG PO TABS: 325.0000 mg | ORAL_TABLET | ORAL | Status: DC | PRN

## 2023-10-10 MED ORDER — LEVETIRACETAM 750 MG PO TABS
750.0000 mg | ORAL_TABLET | Freq: Two times a day (BID) | ORAL | Status: DC
  Administered 2023-10-10: 750 mg via ORAL
  Filled 2023-10-10: qty 1

## 2023-10-10 NOTE — Discharge Summary (Signed)
 Physician Discharge Summary   Patient: Jesse [name] MRN: [id number] DOB: Sep 09, 1940  Admit date:     09/30/2023  Discharge date: 10/10/23  Discharge Physician: Jodeane Mulligan   PCP: Rodney Clamp, MD   Recommendations at discharge:    Pt to be discharged to SNF-C[facility].   If you experience worsening fever, chills, chest pain, shortness of breath, or other concerning symptoms, please call your PCP or go to the emergency department immediately.  Discharge Diagnoses: Principal Problem:   Intracranial bleed (HCC) Active Problems:   Brain mass   Acute encephalopathy   Hyponatremia   Hypokalemia   Hypocalcemia   Paroxysmal atrial fibrillation (HCC)   Hypothyroidism   Mild dementia (HCC)   Prostate cancer (HCC)  Resolved Problems:   * No resolved hospital problems. *   Hospital Course:  83 year old white male Depression Hip arthritis Previous sigmoid volvulus Remote history atrial fibrillation 2013 hypothyroid Previous diagnosed 2016 meningioma 4.1 cm previously declined surgery Prostate cancer RLS   Came to emergency room 5/5 unwitnessed fall facial swelling dislodgment of several teeth with confusion and enlarging left frontal meningioma with bilateral subdural hematomas underwent craniotomy resection 10/03/2023 was on ventilator   5/5 admitted  5/8 craniotomy and tumor resection/ SDH evacuation 5/9 Less responsive -CT showing re-accumulation of SDH with 14mm MLS taken to OR for revision of crani and evacuation of SDH, extubated post op.  EBL , given albumin, s/p 2u FFP, 1.2L LR in OR 5/10 no issues overnight  5/11 urinary retention overnight now s/p 4 in and out caths, placed indwelling Foley this a.m.   Assessment and Plan:  Meningioma subdural hematomas with resection 10/03/2023 and reaccumulation of SDH - Followed closely by neurosurgery.  Appears to be doing well.  Can transition to CIR.  Remaining on IV cefazolin  for 7 days postop.  Patient to follow-up for  staple removal with neurosurgery in 1 week.  Neurosurgery to follow while in rehab.  Discontinue Decadron .  Continue Keppra  750 mg twice daily.  Systolic blood pressure goal less than 160.  Pain control with Tylenol , oxycodone  5 mg IR as needed severe pain.  Paroxysmal atrial fibrillation - On amiodarone.  Resume Eliquis per neurosurgery.  Urinary retention with history of prostate cancer status post prostatectomy and XRT - Continues to have urinary retention.  Failed voiding trial.  Patient to be discharged with Foley catheter.  Discontinue flomax per family request.  Will need to follow-up with urology in the outpatient setting.  Hypothyroidism - Continue Synthroid  as prescribed.  Syncope with hypotension -Code stroke called yesterday 5/14 due to AMS, hypotension, aphasia.  CT/MRI negative.  Evaluated by neurology.  Likely vagal response vs breathrough seizure from left hemispere causing postictal aphasia.  Recommendations to increase keppra  to 750 mg BID.  Avoid hypotension (discontinue flomax).  Continue management of surgical site per neurosurgery.    Goals of care - Patient to transition to CIR for ongoing PT rehab.    Consultants: Neurosurgery Procedures performed: Craniotomy with tumor resection, SDH evacuation, revision craniotomy and excavation of SDH Disposition: Skilled nursing facility Diet recommendation:  Discharge Diet Orders (From admission, onward)     Start     Ordered   10/09/23 0000  Diet - low sodium heart healthy        10/09/23 1125           Regular diet  DISCHARGE MEDICATION: Allergies as of 10/10/2023   No Known Allergies      Medication List  TAKE these medications    acetaminophen  325 MG tablet Commonly known as: TYLENOL  Take 2 tablets (650 mg total) by mouth every 6 (six) hours as needed for mild pain (pain score 1-3) (or Fever >/= 101).   ceFAZolin  2-4 GM/100ML-% IVPB Commonly known as: ANCEF  Inject 100 mLs (2 g total) into the  vein every 8 (eight) hours for 3 days.   docusate sodium  100 MG capsule Commonly known as: COLACE Take 1 capsule (100 mg total) by mouth 2 (two) times daily.   finasteride 5 MG tablet Commonly known as: PROSCAR Take 1 tablet (5 mg total) by mouth daily.   levETIRAcetam  750 MG tablet Commonly known as: KEPPRA  Take 1 tablet (750 mg total) by mouth 2 (two) times daily.   melatonin 5 MG Tabs Take 1 tablet (5 mg total) by mouth at bedtime as needed.   oxyCODONE  5 MG immediate release tablet Commonly known as: Oxy IR/ROXICODONE  Take 1 tablet (5 mg total) by mouth every 6 (six) hours as needed for severe pain (pain score 7-10).   pantoprazole  40 MG tablet Commonly known as: PROTONIX  Take 1 tablet (40 mg total) by mouth at bedtime.   polyethylene glycol 17 g packet Commonly known as: MIRALAX  / GLYCOLAX  Take 17 g by mouth daily as needed for mild constipation.   senna 8.6 MG Tabs tablet Commonly known as: SENOKOT Take 1 tablet (8.6 mg total) by mouth 2 (two) times daily.   Synthroid  50 MCG tablet Generic drug: levothyroxine  TAKE 1 TABLET DAILY BEFORE BREAKFAST   VITAMIN B 12 PO Take 1,000 Units by mouth 3 (three) times a week.               Discharge Care Instructions  (From admission, onward)           Start     Ordered   10/09/23 0000  Change dressing on IV access line weekly and PRN  (Home infusion instructions - [facility] )        10/09/23 1125             Discharge Exam: Filed Weights   09/30/23 2000  Weight: 74.7 kg    GENERAL:  Alert, pleasant, no acute distress  HEENT:  EOMI, well-healing craniotomy surgical incision with staples CARDIOVASCULAR:  RRR, no murmurs appreciated RESPIRATORY:  Clear to auscultation, no wheezing, rales, or rhonchi GASTROINTESTINAL:  Soft, nontender, nondistended EXTREMITIES:  No LE edema bilaterally NEURO: Mild left facial droop no new focal deficits appreciated SKIN:  No rashes noted PSYCH:   Appropriate mood and affect     Condition at discharge: improving  The results of significant diagnostics from this hospitalization (including imaging, microbiology, ancillary and laboratory) are listed below for reference.   Imaging Studies: MR BRAIN W WO CONTRAST Result Date: 10/09/2023 CLINICAL DATA:  Acute neurologic deficit EXAM: MRI HEAD WITHOUT AND WITH CONTRAST TECHNIQUE: Multiplanar, multiecho pulse sequences of the brain and surrounding structures were obtained without and with intravenous contrast. CONTRAST:  7.5mL GADAVIST  GADOBUTROL  1 MMOL/ML IV SOLN COMPARISON:  09/30/2023 FINDINGS: Brain: Status post resection of anterior left convexity meningioma. There are blood products throughout the resection cavity. No residual contrast enhancing mass. Left convexity subdural hematoma measures 7 mm in thickness. 2 mm posterior right convexity subdural hematoma. There is moderate edema within the anterior left frontal white matter. Generalized volume loss. There is rightward midline shift of approximately 9 mm, improved. Vascular: Normal flow voids. Skull and upper cervical spine: Normal calvarium and skull base.  Visualized upper cervical spine and soft tissues are normal. Sinuses/Orbits:No paranasal sinus fluid levels or advanced mucosal thickening. No mastoid or middle ear effusion. Normal orbits. IMPRESSION: 1. Status post resection of anterior left convexity meningioma. No residual contrast enhancing mass. 2. Left convexity subdural hematoma measures 7 mm in thickness. 2 mm posterior right convexity subdural hematoma. 3. Rightward midline shift of approximately 9 mm, improved. Electronically Signed   By: Juanetta Nordmann M.D.   On: 10/09/2023 22:35   EEG adult Result Date: 10/09/2023 Arleene Lack, MD     10/09/2023  5:42 PM Patient Name: Jesse [name] MRN: [id number] Epilepsy Attending: Arleene Lack Referring Physician/Provider: Imogene Mana, NP Date: 10/09/2023 Duration: 22.16 mins Patient  history: 82yo m with ams. EEG to evaluate for seizure Level of alertness: Awake AEDs during EEG study: LEV Technical aspects: This EEG study was done with scalp electrodes positioned according to the 10-20 International system of electrode placement. Electrical activity was reviewed with band pass filter of 1-70Hz , sensitivity of 7 uV/mm, display speed of 16mm/sec with a 60Hz  notched filter applied as appropriate. EEG data were recorded continuously and digitally stored.  Video monitoring was available and reviewed as appropriate. Description: The posterior dominant rhythm consists of 8 Hz activity of moderate voltage (25-35 uV) seen predominantly in posterior head regions, asymmetric ( left<right) and reactive to eye opening and eye closing. EEG showed continuous 3 to 6 Hz theta-delta slowing admixed with 12-14hz  beta activity in left hemisphere. Hyperventilation and photic stimulation were not performed.   ABNORMALITY - Continuous slow, left hemisphere IMPRESSION: This study is suggestive of cortical dysfunction arising from left hemisphere likely secondary to underlying structural abnormality/ SDH. No seizures or epileptiform discharges were seen throughout the recording. Priyanka Suzanne Erps   CT ANGIO HEAD NECK W WO CM (CODE STROKE) Result Date: 10/09/2023 CLINICAL DATA:  Neuro deficit, concern for stroke, slurred speech. EXAM: CT ANGIOGRAPHY HEAD AND NECK WITH AND WITHOUT CONTRAST TECHNIQUE: Multidetector CT imaging of the head and neck was performed using the standard protocol during bolus administration of intravenous contrast. Multiplanar CT image reconstructions and MIPs were obtained to evaluate the vascular anatomy. Carotid stenosis measurements (when applicable) are obtained utilizing NASCET criteria, using the distal internal carotid diameter as the denominator. RADIATION DOSE REDUCTION: This exam was performed according to the departmental dose-optimization program which includes automated exposure  control, adjustment of the mA and/or kV according to patient size and/or use of iterative reconstruction technique. CONTRAST:  75mL OMNIPAQUE IOHEXOL 350 MG/ML SOLN COMPARISON:  Same day CT head.  MRA head 09/30/2023. FINDINGS: CTA NECK FINDINGS Aortic arch: Four vessel configuration of the aortic arch. Imaged portion shows no evidence of aneurysm or dissection. Mild atherosclerosis. No significant stenosis of the major arch vessel origins. Pulmonary arteries: As permitted by contrast timing, there are no filling defects in the visualized pulmonary arteries. Subclavian arteries: The subclavian arteries are patent bilaterally. Right carotid system: No evidence of dissection, stenosis (50% or greater), or occlusion. Bulky calcified atherosclerosis along the proximal cervical ICA without hemodynamically significant stenosis. Left carotid system: No evidence of dissection, stenosis (50% or greater), or occlusion. Mild atherosclerosis at the carotid bifurcation without stenosis. Vertebral arteries: Right vertebral artery is dominant. Atherosclerosis at the left vertebral artery origin. The vessel origin is somewhat obscured due to artifact. There is likely severe stenosis at the origin of the left vertebral artery. The vertebral arteries are patent from the origins to the vertebrobasilar confluence. No evidence of dissection. Skeleton: Postsurgical  changes of the calvarium. No acute or aggressive finding. Sequelae of laminectomy from C4-C6. Degenerative changes in the visualized spine. Other neck: The visualized airway is patent. No cervical lymphadenopathy. Upper chest: Visualized lung apices are clear. Review of the MIP images confirms the above findings CTA HEAD FINDINGS ANTERIOR CIRCULATION: The intracranial ICAs are patent bilaterally. Atherosclerosis of the bilateral carotid siphons. Mild stenosis of the left cavernous ICA. 1.5 mm prominence along the right supraclinoid ICA at the origin of the posterior  communicating artery. No high-grade stenosis, proximal occlusion, or vascular malformation. MCAs: The right M1 segment is patent. Severe stenosis of an M2 superior vision branch of the right MCA. The left M1 segment is patent. Left MCA branches are patent. ACAs: Patent bilaterally. Accounting for differences in modality, there is slightly decreased mass effect on distal ACA branches per compared to prior MRA. POSTERIOR CIRCULATION: No significant stenosis, proximal occlusion, aneurysm, or vascular malformation. PCAs: Patent bilaterally. The left PCA appears primarily supplied by the posterior communicating artery with contribution from a small P1 segment noted. Pcomm: Visualized bilaterally, larger on the left. SCAs: The superior cerebellar arteries are patent bilaterally. Basilar artery: Patent AICAs: None visualized on the right. PICAs: Visualized bilaterally, larger on the left. Vertebral arteries: The intracranial vertebral arteries are patent. Venous sinuses: As permitted by contrast timing, patent. Anatomic variants: None Review of the MIP images confirms the above findings IMPRESSION: No large vessel occlusion. 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 Accounting for differences in modality there is slightly decreased mass effect on the distal ACA branches compared to prior MRA. 1.5 mm outpouching along the right supraclinoid ICA at the origin of the posterior communicating artery which may reflect an infundibular origin versus less likely small aneurysm. Atherosclerosis in the neck as above without high-grade stenosis. Focal severe stenosis at the origin of the non dominant left vertebral artery on the aortic arch. Electronically Signed   By: Denny Flack M.D.   On: 10/09/2023 13:13   CT HEAD CODE STROKE WO CONTRAST Result Date: 10/09/2023 CLINICAL DATA:  Code stroke. Neuro deficit, concern for stroke. Slurred speech. EXAM: CT HEAD WITHOUT CONTRAST TECHNIQUE: Contiguous axial  images were obtained from the base of the skull through the vertex without intravenous contrast. RADIATION DOSE REDUCTION: This exam was performed according to the departmental dose-optimization program which includes automated exposure control, adjustment of the mA and/or kV according to patient size and/or use of iterative reconstruction technique. COMPARISON:  CT head 10/07/2023 and earlier. FINDINGS: Brain: Left frontotemporal craniotomy. Postoperative changes from resection of right frontal lobe mass and evacuation of parenchymal hematoma. Scattered foci of residual parenchymal hemorrhage in the left frontal lobe are overall similar to prior. Minimally increased edema within the left frontal lobe. Redemonstrated mixed attenuation subdural collection over the left frontal lobe measuring up to 11 mm in thickness similar to prior. Additional small focus of subdural hemorrhage over the right parietal lobe measuring to 3 mm. Additional subdural hemorrhage along the posterior falx measuring up to 3 mm in thickness, similar to prior. No new or enlarging foci of intracranial hemorrhage. Slightly decreased pneumocephalus. Mass effect is overall similar to prior with partial effacement of the left lateral ventricle. Trace blood products within the right occipital horn. Approximately 9 mm rightward midline shift not significantly changed from prior. Vascular: No hyperdense vessel or unexpected calcification. Skull: Postsurgical changes without acute or aggressive finding. Sinuses/Orbits: No acute finding. Other: Mastoid air cells are clear. ASPECTS [facility] Stroke  Program Early CT Score) - Ganglionic level infarction (caudate, lentiform nuclei, internal capsule, insula, M1-M3 cortex): 7 - Supraganglionic infarction (M4-M6 cortex): 3 Total score (0-10 with 10 being normal): 10 IMPRESSION: 1. Redemonstrated postsurgical changes of left frontotemporal craniotomy for resection of left frontal lobe mass and parenchymal  hematoma evacuation. 2. Similar appearance of multi compartment intracranial hemorrhage. No new or enlarging foci of intracranial hemorrhage. 3. Similar mass effect with approximately 9 mm rightward midline shift. 4. ASPECTS is 10 These results were communicated to Dr. Arora at 12:38 pm on 10/09/2023 by text page via the [facility] messaging system. Electronically Signed   By: Denny Flack M.D.   On: 10/09/2023 12:38   CT HEAD WO CONTRAST ( ) Result Date: 10/07/2023 CLINICAL DATA:  Provided history: Check resolution of shift. EXAM: CT HEAD WITHOUT CONTRAST TECHNIQUE: Contiguous axial images were obtained from the base of the skull through the vertex without intravenous contrast. RADIATION DOSE REDUCTION: This exam was performed according to the departmental dose-optimization program which includes automated exposure control, adjustment of the mA and/or kV according to patient size and/or use of iterative reconstruction technique. COMPARISON:  Prior head CT examinations 10/04/2023 and earlier. Brain MRI 09/30/2023. FINDINGS: Brain: Continued interval evolution of postoperative changes from recent prior resection of a left frontal mass and from subsequent left frontal parenchymal hematoma evacuation. Small patchy foci of parenchymal hemorrhage within the left frontal lobe (at site of prior left frontal mass resection) are non-progressed. Edema at this site has slightly progressed. Pneumocephalus persistent although decreased pneumocephalus scattered along the left greater than right frontal lobes. Residual mixed density subdural hematoma along the left cerebral convexity, measuring up to 11 mm in thickness (for instance as seen on series 5, image 50). Subdural hemorrhage along the right cerebral convexity measuring up to 3 mm in thickness, present on the prior head CT of 10/04/2023 but better appreciated on today's study (for instance as seen on series 5, image 25). Persistent mass effect with partial effacement of  the left lateral ventricle. Rightward midline shift now measures 7-8 mm (previously 9 mm). Persistent trace hemorrhage within the occipital horn of the right lateral ventricle. Unchanged size and configuration of the ventricular system. Subdural hemorrhage along the mid and posterior falx, measuring up to 3 mm in thickness, similar to the prior CT. Vascular: No hyperdense vessel.  Atherosclerotic calcifications. Skull: Left-sided cranioplasty. Sinuses/Orbits: No orbital mass or acute orbital finding. No significant paranasal sinus disease. IMPRESSION: 1. Continued interval evolution of postoperative changes from recent left frontal mass resection, and subsequent left frontal parenchymal hematoma evacuation. 2. Patchy foci of parenchymal hemorrhage within the left frontal lobe, non-progressed. Surrounding parenchymal edema has slightly progressed. 3. Residual mixed density subdural hematoma along the left cerebral convexity, measuring up to 11 mm in thickness. 4. Persistent mass effect with partial effacement of the left lateral ventricle. Decreased rightward midline shift now measuring 7-8 mm (previously 9 mm). 5. Thin subdural hematoma along the right cerebral convexity (measuring up to 3 mm in thickness), non-progressed. 6. Trace hemorrhage within the right lateral ventricle occipital horn, unchanged. 7. Subdural hemorrhage along the mid and posterior falx (measuring up to 3 mm in thickness), also not significantly changed. Electronically Signed   By: Bascom Lily D.O.   On: 10/07/2023 15:01   CT HEAD WO CONTRAST ( ) Result Date: 10/04/2023 CLINICAL DATA:  Follow-up status post craniotomy. EXAM: CT HEAD WITHOUT CONTRAST TECHNIQUE: Contiguous axial images were obtained from the base of the skull through the vertex without  intravenous contrast. RADIATION DOSE REDUCTION: This exam was performed according to the departmental dose-optimization program which includes automated exposure control, adjustment of the mA  and/or kV according to patient size and/or use of iterative reconstruction technique. COMPARISON:  CT head earlier same day, MRI head 09/30/2023. FINDINGS: Brain: Postsurgical changes of right frontotemporal craniotomy. Interval increase in pneumocephalus over the left frontal lobe and new component of pneumocephalus over the anterior right frontal lobe and right temporal lobe. Mixed attenuation subdural collection now measures up to 10 mm in thickness with decreased component of hyperattenuating acute blood products compared to earlier same day CT. Region of patchy parenchymal hemorrhage is significantly decreased from prior with few scattered areas of hemorrhage noted the largest of which measures up to 12 mm. Similar edema in the left frontal lobe. There is decreased mass effect compared to prior now with up to 9 mm of rightward midline shift, previously 14 mm. Additional component of subdural hemorrhage posteriorly along the falx measuring up to 3 mm in thickness likely reflecting slight redistribution of blood products. Decreased mass effect on the left lateral ventricle. Slightly decreased caliber of the right lateral ventricle. Decreased mass effect on the third ventricle also noted. Trace blood products within the right occipital horn. Vascular: Atherosclerotic calcifications of the carotid siphons and intracranial vertebral arteries. No hyperdense vessel. Skull: Postsurgical changes without acute or aggressive finding. Sinuses/Orbits: Orbits are symmetric. No significant mucosal thickening in the paranasal sinuses. Other: Mastoid air cells are clear. Interval placement of surgical drain within the left frontoparietal scalp with tip overlying the anterior aspect of craniotomy site. There is slightly increased intramuscular emphysema over the left frontotemporal scalp. IMPRESSION: Interval evacuation of intraparenchymal hemorrhage in the left frontal lobe. Scattered residual foci of parenchymal hemorrhage the  largest of which measures up to 12 mm. Decreased mass effect now with 9 mm midline shift, previously 14 mm. Decreased subdural hematoma over the left cerebral convexity. Component of subdural hemorrhage along the posterior falx is minimally increased measuring up to 3 mm likely reflecting redistribution of blood products. Improved caliber of the ventricles as above. Trace blood products now noted in the right occipital horn. Increased pneumocephalus as above. Electronically Signed   By: Denny Flack M.D.   On: 10/04/2023 16:45   CT HEAD WO CONTRAST ( ) Result Date: 10/04/2023 CLINICAL DATA:  Weakness and altered mental status EXAM: CT HEAD WITHOUT CONTRAST TECHNIQUE: Contiguous axial images were obtained from the base of the skull through the vertex without intravenous contrast. RADIATION DOSE REDUCTION: This exam was performed according to the departmental dose-optimization program which includes automated exposure control, adjustment of the mA and/or kV according to patient size and/or use of iterative reconstruction technique. COMPARISON:  Head CT and brain MRI from 4 days prior FINDINGS: Brain: Recent resection of left frontal mass. Subdural gas and hemorrhage measuring up to 11 mm in thickness. There is also patchy parenchymal hemorrhage at the level of prior left frontal mass effect, left frontal hemorrhage measuring up to 4 cm in length on coronal reformats. Midline shift measures 14 mm, mildly increased from preoperative. No detected infarct. Unchanged degree of right lateral ventriculomegaly. Vascular: Negative Skull: Unremarkable left-sided craniotomy. Sinuses/Orbits: Negative Other: Case discussed with Dr. Ellery Guthrie. IMPRESSION: Blood products in the left frontal lobe and spreading along the left subdural space, subdural collection measuring up to 11 mm in thickness. Midline shift is now 14 mm, mildly increased from preoperative exam. Electronically Signed   By: Treva Fuchs.D.  On: 10/04/2023  06:55   MR BRAIN W WO CONTRAST Result Date: 09/30/2023 CLINICAL DATA:  Brain/CNS neoplasm, staging EXAM: MRI HEAD WITHOUT AND WITH CONTRAST TECHNIQUE: Multiplanar, multiecho pulse sequences of the brain and surrounding structures were obtained without and with intravenous contrast. CONTRAST:  8mL GADAVIST  GADOBUTROL  1 MMOL/ML IV SOLN COMPARISON:  MRI head September 04, 2017. FINDINGS: Brain: Large (6.1 x 4.8 x 4.7 cm) enhancing and heterogeneous/hemorrhagic extra-axial dural-based mass along the left frontal convexity. Surrounding edema and mass effect with up to 1.3 cm rightward midline shift. Dural thickening and fluid collections along the left greater than right cerebral convexities, measuring up to 6 mm on the left and trace on the right. No evidence of acute infarct or hydrocephalus. Vascular: Major arterial flow voids are maintained skull base. Skull and upper cervical spine: Normal marrow signal. Sinuses/Orbits: Clear sinuses.  No acute orbital findings. IMPRESSION: 1. 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 Less likely considerations include hemangiopericytoma or dural metastasis. This may represent interval growth of the lesion seen on 2019 MRI. 2. Resulting 1.3 cm of rightward midline shift. 3. Left larger than right (6 mm in thickness on the left) cerebral convexity subdural collections, most likely subdural hematomas. Electronically Signed   By: Stevenson Elbe M.D.   On: 09/30/2023 19:55   EEG adult Result Date: 09/30/2023 Arleene Lack, MD     09/30/2023  4:49 PM Patient Name: Jesse [name] MRN: [id number] Epilepsy Attending: Arleene Lack Referring Physician/Provider: Lena Qualia, MD Date: 09/30/2023 Duration: 25.54 mins Patient history: 83yo M with left frontal extra axial lesion. EEG to evaluate for seizure Level of alertness: Awake AEDs during EEG study: None Technical aspects: This EEG study was done with scalp  electrodes positioned according to the 10-20 International system of electrode placement. Electrical activity was reviewed with band pass filter of 1-70Hz , sensitivity of 7 uV/mm, display speed of 19mm/sec with a 60Hz  notched filter applied as appropriate. EEG data were recorded continuously and digitally stored.  Video monitoring was available and reviewed as appropriate. Description: The posterior dominant rhythm consists of 9 Hz activity of moderate voltage (25-35 uV) seen predominantly in posterior head regions, symmetric and reactive to eye opening and eye closing. EEG showed continuous 3 to 6 Hz theta-delta slowing in left fronto-centro-temporal region. Hyperventilation and photic stimulation were not performed.   ABNORMALITY - Continuous slow, left fronto-centro-temporal region IMPRESSION: This study is suggestive of cortical dysfunction arising from left fronto-centro-temporal region likely secondary to underlying structural abnormality. No seizures or epileptiform discharges were seen throughout the recording. Priyanka Suzanne Erps   MR ANGIO HEAD WO CONTRAST Result Date: 09/30/2023 CLINICAL DATA:  Neuro deficit, concern for stroke. EXAM: MRA HEAD WITHOUT CONTRAST TECHNIQUE: Angiographic images of the Circle of Willis were acquired using MRA technique without intravenous contrast. COMPARISON:  Earlier same day CT head.  MRI head 09/04/2017. FINDINGS: Anterior circulation: The intracranial internal carotid arteries are patent and appear normal in caliber. No evidence of high-grade stenosis. The M1 segment of the right MCA is patent. Right MCA bifurcation is patent. Patent M2 branches of the right MCA. Distal right MCA branches are patent. The left M1 segment is patent. Normal appearance of the left MCA bifurcation. Left M2 branches are patent. Distal left MCA branches are patent. The A1 and A2 segments are patent bilaterally. Distal ACA branches are patent. There is subtle mass effect on A4 and A5 branches  bilaterally due  to large extra-axial mass over the left frontal lobe. Posterior circulation: The visualized intracranial vertebral arteries are patent. Right vertebral artery is dominant. The basilar artery is patent. Posterior communicating arteries noted bilaterally. The PCAs are patent bilaterally. The superior cerebellar arteries are patent bilaterally. AICA visualized on the right. PICA visualized bilaterally, more pronounced on the left. Anatomic variants: None significant. Other: Redemonstrated large extra-axial mass overlying the left frontal lobe. Left subdural collection better evaluated on same day head CT. IMPRESSION: Patent intracranial arterial vasculature. Mild mass effect on A4 and A5 branches of the ACA is secondary to large extra-axial mass over the left frontal lobe. No high-grade stenosis. Electronically Signed   By: Denny Flack M.D.   On: 09/30/2023 13:24   CT HEAD WO CONTRAST Addendum Date: 09/30/2023 ADDENDUM REPORT: 09/30/2023 03:26 ADDENDUM: Findings discussed with Dr. Monique Ano via telephone at 3:26 a.m. Electronically Signed   By: Stevenson Elbe M.D.   On: 09/30/2023 03:26   Result Date: 09/30/2023 CLINICAL DATA:  Polytrauma, blunt; Polytrauma, penetrating EXAM: CT HEAD WITHOUT CONTRAST CT MAXILLOFACIAL WITHOUT CONTRAST CT CERVICAL SPINE WITHOUT CONTRAST TECHNIQUE: Multidetector CT imaging of the head, cervical spine, and maxillofacial structures were performed using the standard protocol without intravenous contrast. Multiplanar CT image reconstructions of the cervical spine and maxillofacial structures were also generated. RADIATION DOSE REDUCTION: This exam was performed according to the departmental dose-optimization program which includes automated exposure control, adjustment of the mA and/or kV according to patient size and/or use of iterative reconstruction technique. COMPARISON:  MRI September 04, 2017 FINDINGS: CT HEAD FINDINGS Brain: Large (approximately 6.5 cm) heterogeneous and  hemorrhagic extra-axial mass along the left frontal convexity with significant mass effect and approximately 1.5 cm of rightward midline shift. Approximately 7 mm thick predominately predominantly low-density subdural fluid collection along the left cerebral convexity. Small amount of hyperdensity within the collection. No evidence of acute large vascular territory infarct or hydrocephalus. Vascular: Calcific atherosclerosis. Skull: No acute fracture. Other: No mastoid effusions. CT MAXILLOFACIAL FINDINGS Osseous: No fracture or mandibular dislocation. No destructive process. Orbits: Negative. No traumatic or inflammatory finding. Sinuses: Mostly clear. Soft tissues: Negative. CT CERVICAL SPINE FINDINGS Alignment: No substantial sagittal subluxation. Skull base and vertebrae: No acute fracture. Soft tissues and spinal canal: No prevertebral fluid or swelling. No visible canal hematoma. Disc levels:  Mild for age multilevel degenerative change. Upper chest: Clear sinuses. IMPRESSION: 1. Large (approximately 6.5 cm) heterogeneous and hemorrhagic extra-axial mass along the left frontal convexity with 1.5 cm of rightward midline shift, likely significant increase in size of the lesion seen on 2019 MRI. Recommend MRI head with contrast and neurosurgery consultation. 2. Approximately 7 mm thick predominately predominantly low-density subdural fluid collection along the left cerebral convexity, likely chronic hematoma. Small amount of hyperdensity within the collection is suggestive of acute/recent hemorrhage. 3. No evidence of acute fracture or traumatic malalignment in the cervical spine. Electronically Signed: By: Stevenson Elbe M.D. On: 09/30/2023 03:15   CT Maxillofacial Wo Contrast Addendum Date: 09/30/2023 ADDENDUM REPORT: 09/30/2023 03:26 ADDENDUM: Findings discussed with Dr. Monique Ano via telephone at 3:26 a.m. Electronically Signed   By: Stevenson Elbe M.D.   On: 09/30/2023 03:26   Result Date:  09/30/2023 CLINICAL DATA:  Polytrauma, blunt; Polytrauma, penetrating EXAM: CT HEAD WITHOUT CONTRAST CT MAXILLOFACIAL WITHOUT CONTRAST CT CERVICAL SPINE WITHOUT CONTRAST TECHNIQUE: Multidetector CT imaging of the head, cervical spine, and maxillofacial structures were performed using the standard protocol without intravenous contrast. Multiplanar CT image reconstructions of the cervical spine and  maxillofacial structures were also generated. RADIATION DOSE REDUCTION: This exam was performed according to the departmental dose-optimization program which includes automated exposure control, adjustment of the mA and/or kV according to patient size and/or use of iterative reconstruction technique. COMPARISON:  MRI September 04, 2017 FINDINGS: CT HEAD FINDINGS Brain: Large (approximately 6.5 cm) heterogeneous and hemorrhagic extra-axial mass along the left frontal convexity with significant mass effect and approximately 1.5 cm of rightward midline shift. Approximately 7 mm thick predominately predominantly low-density subdural fluid collection along the left cerebral convexity. Small amount of hyperdensity within the collection. No evidence of acute large vascular territory infarct or hydrocephalus. Vascular: Calcific atherosclerosis. Skull: No acute fracture. Other: No mastoid effusions. CT MAXILLOFACIAL FINDINGS Osseous: No fracture or mandibular dislocation. No destructive process. Orbits: Negative. No traumatic or inflammatory finding. Sinuses: Mostly clear. Soft tissues: Negative. CT CERVICAL SPINE FINDINGS Alignment: No substantial sagittal subluxation. Skull base and vertebrae: No acute fracture. Soft tissues and spinal canal: No prevertebral fluid or swelling. No visible canal hematoma. Disc levels:  Mild for age multilevel degenerative change. Upper chest: Clear sinuses. IMPRESSION: 1. Large (approximately 6.5 cm) heterogeneous and hemorrhagic extra-axial mass along the left frontal convexity with 1.5 cm of rightward  midline shift, likely significant increase in size of the lesion seen on 2019 MRI. Recommend MRI head with contrast and neurosurgery consultation. 2. Approximately 7 mm thick predominately predominantly low-density subdural fluid collection along the left cerebral convexity, likely chronic hematoma. Small amount of hyperdensity within the collection is suggestive of acute/recent hemorrhage. 3. No evidence of acute fracture or traumatic malalignment in the cervical spine. Electronically Signed: By: Stevenson Elbe M.D. On: 09/30/2023 03:15   CT Cervical Spine Wo Contrast Addendum Date: 09/30/2023 ADDENDUM REPORT: 09/30/2023 03:26 ADDENDUM: Findings discussed with Dr. Monique Ano via telephone at 3:26 a.m. Electronically Signed   By: Stevenson Elbe M.D.   On: 09/30/2023 03:26   Result Date: 09/30/2023 CLINICAL DATA:  Polytrauma, blunt; Polytrauma, penetrating EXAM: CT HEAD WITHOUT CONTRAST CT MAXILLOFACIAL WITHOUT CONTRAST CT CERVICAL SPINE WITHOUT CONTRAST TECHNIQUE: Multidetector CT imaging of the head, cervical spine, and maxillofacial structures were performed using the standard protocol without intravenous contrast. Multiplanar CT image reconstructions of the cervical spine and maxillofacial structures were also generated. RADIATION DOSE REDUCTION: This exam was performed according to the departmental dose-optimization program which includes automated exposure control, adjustment of the mA and/or kV according to patient size and/or use of iterative reconstruction technique. COMPARISON:  MRI September 04, 2017 FINDINGS: CT HEAD FINDINGS Brain: Large (approximately 6.5 cm) heterogeneous and hemorrhagic extra-axial mass along the left frontal convexity with significant mass effect and approximately 1.5 cm of rightward midline shift. Approximately 7 mm thick predominately predominantly low-density subdural fluid collection along the left cerebral convexity. Small amount of hyperdensity within the collection. No evidence  of acute large vascular territory infarct or hydrocephalus. Vascular: Calcific atherosclerosis. Skull: No acute fracture. Other: No mastoid effusions. CT MAXILLOFACIAL FINDINGS Osseous: No fracture or mandibular dislocation. No destructive process. Orbits: Negative. No traumatic or inflammatory finding. Sinuses: Mostly clear. Soft tissues: Negative. CT CERVICAL SPINE FINDINGS Alignment: No substantial sagittal subluxation. Skull base and vertebrae: No acute fracture. Soft tissues and spinal canal: No prevertebral fluid or swelling. No visible canal hematoma. Disc levels:  Mild for age multilevel degenerative change. Upper chest: Clear sinuses. IMPRESSION: 1. Large (approximately 6.5 cm) heterogeneous and hemorrhagic extra-axial mass along the left frontal convexity with 1.5 cm of rightward midline shift, likely significant increase in size of the lesion seen  on 2019 MRI. Recommend MRI head with contrast and neurosurgery consultation. 2. Approximately 7 mm thick predominately predominantly low-density subdural fluid collection along the left cerebral convexity, likely chronic hematoma. Small amount of hyperdensity within the collection is suggestive of acute/recent hemorrhage. 3. No evidence of acute fracture or traumatic malalignment in the cervical spine. Electronically Signed: By: Stevenson Elbe M.D. On: 09/30/2023 03:15    Microbiology: Results for orders placed or performed during the hospital encounter of 09/30/23  Surgical PCR screen     Status: None   Collection Time: 10/02/23  8:00 AM   Specimen: Nasal Mucosa; Nasal Swab  Result Value Ref Range Status   MRSA, PCR NEGATIVE NEGATIVE Final   Staphylococcus aureus NEGATIVE NEGATIVE Final    Comment: (NOTE) The Xpert SA Assay (FDA approved for NASAL specimens in patients 7 years of age and older), is one component of a comprehensive surveillance program. It is not intended to diagnose infection nor to guide or monitor treatment. Performed at  [facility], [address], [place], [place] [address]   MRSA Next Gen by PCR, Nasal     Status: None   Collection Time: 10/03/23 12:49 PM   Specimen: Nasal Mucosa; Nasal Swab  Result Value Ref Range Status   MRSA by PCR Next Gen NOT DETECTED NOT DETECTED Final    Comment: (NOTE) The GeneXpert MRSA Assay (FDA approved for NASAL specimens only), is one component of a comprehensive MRSA colonization surveillance program. It is not intended to diagnose MRSA infection nor to guide or monitor treatment for MRSA infections. Test performance is not FDA approved in patients less than 70 years old. Performed at [facility], [address], [place], [place] [address]     Labs: CBC: Recent Labs  Lab 10/06/23 [id number] 10/07/23 0622 10/08/23 0559 10/09/23 0537 10/10/23 0613  WBC 7.4 10.9* 9.6 8.6 8.9  NEUTROABS  --   --   --  6.7 7.0  HGB 10.0* 10.1* 10.2* 10.7* 10.3*  HCT 30.2* 29.3* 29.1* 30.7* 30.2*  MCV 89.6 87.2 86.6 87.2 88.6  PLT 169 192 198 221 217   Basic Metabolic Panel: Recent Labs  Lab 10/05/23 0610 10/06/23 0644 10/07/23 0622 10/08/23 0559 10/09/23 0537 10/10/23 0613  NA 134* 132* 131* 130* 132* 132*  K 3.9 4.1 4.1 4.1 4.2 4.8  CL 102 100 101 100 99 100  CO2 24 24 25 22 24 23   GLUCOSE 118* 125* 115* 117* 118* 110*  BUN 19 19 19 19 20 20   CREATININE 0.69 0.70 0.76 0.73 0.81 0.88  CALCIUM  8.2* 8.3* 8.3* 8.4* 8.5* 8.4*  MG 2.1  --   --   --   --   --    Liver Function Tests: No results for input(s): "AST", "ALT", "ALKPHOS", "BILITOT", "PROT", "ALBUMIN" in the last 168 hours. CBG: Recent Labs  Lab 10/09/23 1147  GLUCAP 141*    Discharge time spent: 35 minutes.  Signed: Jodeane Mulligan, DO [facility] 10/10/2023

## 2023-10-10 NOTE — Progress Notes (Signed)
 KUB shows adynamic ileus--has been downgraded to liquid diet. Will add reglan  IV. Recheck KUB in am.

## 2023-10-10 NOTE — Progress Notes (Signed)
 Inpatient Rehabilitation Admission Medication Review by a Pharmacist  A complete drug regimen review was completed for this patient to identify any potential clinically significant medication issues.  High Risk Drug Classes Is patient taking? Indication by Medication  Antipsychotic Yes, as an intravenous medication Compazine - n/v  Anticoagulant Yes Lovenox - vte ppx  Antibiotic No   Opioid Yes oxyIR- acute pain  Antiplatelet No   Hypoglycemics/insulin No   Vasoactive Medication Yes Flomax- BPH  Chemotherapy No   Other Yes Keppra - seizure ppx Protonix - GERD Synthroid - hypothyroidism Benadryl - itching Melatonin- sleep     Type of Medication Issue Identified Description of Issue Recommendation(s)  Drug Interaction(s) (clinically significant)     Duplicate Therapy     Allergy     No Medication Administration End Date     Incorrect Dose     Additional Drug Therapy Needed     Significant med changes from prior encounter (inform family/care partners about these prior to discharge).    Other       Clinically significant medication issues were identified that warrant physician communication and completion of prescribed/recommended actions by midnight of the next day:  No   Time spent performing this drug regimen review (minutes): 30   Amad Mau BS, PharmD, BCPS Clinical Pharmacist 10/10/2023 2:09 PM  Contact: [phone] after 3 PM  "Be curious, not judgmental..." -Rumalda Counter

## 2023-10-10 NOTE — H&P (Signed)
 Physical Medicine and Rehabilitation Admission H&P        Chief Complaint  Patient presents with   Functional decline.    weakness      HPI:  Jesse [name] is an 83 year old R handed male with history of A fib, RLS, dementia, Prostate CA, meningioma dx 2016 (asymptomatic and had refused excision) who was admitted to [facility] on 09/30/23 with one week history of progressive weakness with cognitive change and unwitnessed fall. He was found to have large heterogenous and hemorrhagic extra axial mass along left frontal convexity with 1.5 cm rightward midline shift and 7 mm subdural hemorrhage felt to be acute on chronic. MRA brain showed patent intracranial arterial vasculature with mild mass effect on A4 and A5 braches of ACA secondary to large mass and MRI showed 6.1 cm extra-axial mass most likely aggressive meningioma as well as L>R SDH.  Dr. Ellery Guthrie consulted and patient agreeable to undergo surgical intervention. Palliative care consulted and he elected on full scope of care. He underwent  crani for resection of left frontal brain tumor and for evacuation of SDH on 10/03/23. Post op course complicated by mental status changes with decrease in verbal out put and was found to have accumulation of bleed with mass effect.  He received 2 units FFP and was taken back to OR on 05/09 for revision craniotomy and evacuation of SDH. Post op decadron  taper and Keppra  for seizure prophylaxis.   Hospital course significant for urinary retention requiring I/O caths on 05/10 therefore foley placed. Left sided weakness has resolved with improvement in mentation and verbal output. He was noted to have mild right sided weakness by Dr. Ellery Guthrie on 05/12. Repeat CT head done revealing post op evolution on left with no progression in hemorrhage, slight progression of parenchymal edema, residual SDH left cerebral convexity with partial effacement of left lateral ventricle, decrease in rightward midline shift and no other  significant changes. Mild hyponatremia being monitored. Post op has had issues with constipation. He did have vasovagal episode yesterday after large BM while in [place] with his wife and was altered, dysarthric and aphasic. SBP noted to be in 80's and was bolused with IVF. CT/CTA  head negative for LVO and revealed severe stenosis of M2 superior division of R-MCA and focal severe stenosis at origin of non dominant L-VA on aortic arch. EEG showed left cortical dysfunction without seizures. Dr. Bonnita Buttner questioned vasovagal syncope v/s breakthrough seizure left hemisphere causing post ictal aphasia and recommended increasing Keppra  to 750 mg BID and avoid hypotension.    Wife feels Flomax is causing pt to have not only orthostasis, but could have made him more confused- she does not want to have this medicine per nursing.    Pt also reports he refuses to eat "until my wife comes to feed me"- declined staff "feeding him".  Denies feeling constipated, however abd distended/firm- pt still says LBM yesterday and no issues.         Review of Systems  Constitutional:  Negative for chills and fever.  HENT:  Negative for hearing loss.   Eyes:  Negative for blurred vision and double vision.  Respiratory:  Negative for cough and shortness of breath.   Cardiovascular:  Negative for chest pain and palpitations.  Gastrointestinal:  Negative for abdominal pain, constipation, heartburn and nausea.  Genitourinary:  Negative for dysuria and urgency.  Musculoskeletal:  Negative for back pain, joint pain and myalgias.  Neurological:  Positive for  weakness. Negative for dizziness and headaches.  Psychiatric/Behavioral:  The patient is not nervous/anxious.   All other systems reviewed and are negative.           Past Medical History:  Diagnosis Date   Arthritis     Atrial fibrillation (HCC)     Complication of anesthesia      Atrial fibrillation during surgery    Dementia (HCC)     Depression     Hard of hearing      Hx of constipation     Osteoporosis      DEXA 11/09/10. Completed treatemtn with Fosamax.   Prostate cancer (HCC)     Restless legs     Thyroid  disease      hypothyroidism   Vitiligo                 Past Surgical History:  Procedure Laterality Date   BACK SURGERY       COLONOSCOPY       CRANIOTOMY Left 10/03/2023    Procedure: CRANIOTOMY TUMOR EXCISION LEFT FRONTAL;  Surgeon: Elna Haggis, MD;  Location: [facility] OR;  Service: Neurosurgery;  Laterality: Left;  Left Frontal Craniotomy for Menigioma   CRANIOTOMY N/A 10/04/2023    Procedure: CRANIOTOMY HEMATOMA EVACUATION SUBDURAL;  Surgeon: Elna Haggis, MD;  Location: [facility] OR;  Service: Neurosurgery;  Laterality: N/A;  REPEAT   FLEXIBLE SIGMOIDOSCOPY N/A 09/27/2017    Procedure: FLEXIBLE SIGMOIDOSCOPY;  Surgeon: Kenney Peacemaker, MD;  Location: [facility] ENDOSCOPY;  Service: Endoscopy;  Laterality: N/A;   JOINT REPLACEMENT       LAMINECTOMY   10/2011    cervical and lumbar laminectomy   PROSTATE BIOPSY   07/10/2011   PROSTATE BIOPSY   09/22/15   TOTAL HIP ARTHROPLASTY Right 02/29/2012   TOTAL HIP ARTHROPLASTY Right 07/04/2016    Procedure: RIGHT TOTAL HIP ARTHROPLASTY ANTERIOR APPROACH;  Surgeon: Adonica Hoose, MD;  Location: [facility] OR;  Service: Orthopedics;  Laterality: Right;   TRANSURETHRAL RESECTION OF PROSTATE                 Family History  Problem Relation Age of Onset   Atrial fibrillation Mother     Cystic fibrosis Mother     Atrial fibrillation Sister     Cancer Neg Hx     Esophageal cancer Neg Hx     Colon cancer Neg Hx     Rectal cancer Neg Hx     Stomach cancer Neg Hx            Social History:  reports that he quit smoking about 49 years ago. His smoking use included cigarettes. He has never used smokeless tobacco. He reports current alcohol use. He reports that he does not use drugs.     Allergies:  Allergies  No Known Allergies             Medications Prior to Admission  Medication Sig Dispense Refill    Cyanocobalamin  (VITAMIN B 12 PO) Take 1,000 Units by mouth 3 (three) times a week.        SYNTHROID  50 MCG tablet TAKE 1 TABLET DAILY BEFORE BREAKFAST 90 tablet 2              Home: Home Living Family/patient expects to be discharged to:: Private residence Living Arrangements: Spouse/significant other Available Help at Discharge: Family Type of Home: House Home Access: Level entry Home Layout: Two level, 1/2 bath on main level, Able to live on main level with bedroom/bathroom  Bathroom Shower/Tub: Psychologist, counselling (upstairs) Bathroom Toilet: Handicapped height Home Equipment: Agricultural consultant (2 wheels), BSC/3in1, Wheelchair - manual   Functional History: Prior Function Prior Level of Function : Needs assist Mobility Comments: per wife, pt using RW in the home ADLs Comments: per pt report requires assist from spouse for ADLs recently   Functional Status:  Mobility: Bed Mobility Overal bed mobility: Needs Assistance Bed Mobility: Supine to Sit Supine to sit: Supervision Sit to supine: Mod assist, HOB elevated, Used rails General bed mobility comments: no physical assistance provided. Transfers Overall transfer level: Needs assistance Equipment used: Rolling walker (2 wheels) Transfers: Sit to/from Stand Sit to Stand: Min assist, +2 safety/equipment Bed to/from chair/wheelchair/BSC transfer type:: Step pivot Step pivot transfers: Mod assist General transfer comment: cues for hand placement, light assist for balance on rise Ambulation/Gait Ambulation/Gait assistance: Min assist, +2 safety/equipment Gait Distance (Feet): 20 Feet Assistive device: Rolling walker (2 wheels) Gait Pattern/deviations: Step-to pattern, Decreased stride length General Gait Details: Pt able to ambulate in room with RW Min A. +2 for safety. no LOB noted. Able to stand at sink with OT for ~5 minutes. Gait velocity: decreased Gait velocity interpretation: <1.31 ft/sec, indicative of household  ambulator Stairs: Yes Stairs assistance: Min assist Stair Management: Two rails, Alternating pattern, Step to pattern, Forwards Number of Stairs: 2 General stair comments: pt performed alternating gait pattern ascending the stairs and step to gait pattern descending the stairs.   ADL: ADL Overall ADL's : Needs assistance/impaired Eating/Feeding: Set up, Sitting Grooming: Minimal assistance, Standing Grooming Details (indicate cue type and reason): intermittent min A for balance secondary to R lateral lean Upper Body Bathing: Contact guard assist, Sitting Lower Body Bathing: Sitting/lateral leans, Minimal assistance Upper Body Dressing : Sitting, Set up Lower Body Dressing: Sit to/from stand, Minimal assistance Toilet Transfer: Minimal assistance, Ambulation, Rolling walker (2 wheels) Toilet Transfer Details (indicate cue type and reason): min A for balance Toileting- Clothing Manipulation and Hygiene: Sit to/from stand, Moderate assistance Functional mobility during ADLs: Minimal assistance, Rolling walker (2 wheels)   Cognition: Cognition Orientation Level: Oriented X4 Cognition Arousal: Alert Behavior During Therapy: Flat affect     Blood pressure 124/71, pulse 66, temperature 97.6 F (36.4 C), temperature source Oral, resp. rate 18, height 5\' 11"  (1.803 m), weight 74.7 kg, SpO2 99%. Physical Exam Vitals and nursing note reviewed.  Constitutional:      General: He is not in acute distress.    Appearance: He is normal weight.     Comments: Pt sitting up in bed; lunch at bedside untouched; awake, alert, withdrawn affect, NAD  HENT:     Head:     Comments: Crani incision C/D/I with sutures in place.  Sutures, not staples- look good- no drainage- no dressing- and no erythema  L facial bruising- reddish/purple    Right Ear: External ear normal.     Left Ear: External ear normal.     Nose: Nose normal. No congestion.     Mouth/Throat:     Mouth: Mucous membranes are dry.      Pharynx: Oropharynx is clear. No oropharyngeal exudate.  Eyes:     General:        Right eye: No discharge.        Left eye: No discharge.     Extraocular Movements: Extraocular movements intact.  Cardiovascular:     Rate and Rhythm: Normal rate. Rhythm irregular.     Heart sounds: Normal heart sounds. No murmur heard.  No gallop.  Pulmonary:     Effort: Pulmonary effort is normal. No respiratory distress.     Breath sounds: Normal breath sounds. No wheezing, rhonchi or rales.  Abdominal:     General: There is distension.     Tenderness: There is no abdominal tenderness.     Comments: Occasional tinkling BS. Very quiet- firm- distended- but pt denies any constipation, any issues   Genitourinary:    Comments: Foley in place- medium amber urine Musculoskeletal:     Cervical back: Neck supple. No tenderness.     Comments:  4+/5 throughout except FA 3+/5 B/L    Skin:    General: Skin is warm and dry.     Comments: L eye/facial bruising, crani as already detailed; LUE bruising A few teeth missing  Neurological:     Mental Status: He is alert and oriented to person, place, and time.     Comments: Flat affect. Left facial weakness with soft voice and minimal dysarthria. Mild hearing loss? Slow to initiate and did not realize that he had a foley until cued. He was able to follow simple motor commands.  Delayed responses, and odd withdrawn affect, but able to answer orientation questions- did think next holiday was "may day- which is 09/26/23. Hoffman's LUE-not RUE- no increased tone or clonus otherwise  Psychiatric:     Comments: Extremely flat        Lab Results Last 48 Hours        Results for orders placed or performed during the hospital encounter of 09/30/23 (from the past 48 hours)  CBC     Status: Abnormal    Collection Time: 10/08/23  5:59 AM  Result Value Ref Range    WBC 9.6 4.0 - 10.5 K/uL    RBC 3.36 (L) 4.22 - 5.81 MIL/uL    Hemoglobin 10.2 (L) 13.0 - 17.0 g/dL     HCT 11.9 (L) 14.7 - 52.0 %    MCV 86.6 80.0 - 100.0 fL    MCH 30.4 26.0 - 34.0 pg    MCHC 35.1 30.0 - 36.0 g/dL    RDW 82.9 56.2 - 13.0 %    Platelets 198 150 - 400 K/uL    nRBC 0.0 0.0 - 0.2 %      Comment: Performed at [facility], [address] [place], [place] [address]  Basic metabolic panel with GFR     Status: Abnormal    Collection Time: 10/08/23  5:59 AM  Result Value Ref Range    Sodium 130 (L) 135 - 145 mmol/L    Potassium 4.1 3.5 - 5.1 mmol/L    Chloride 100 98 - 111 mmol/L    CO2 22 22 - 32 mmol/L    Glucose, Bld 117 (H) 70 - 99 mg/dL      Comment: Glucose reference range applies only to samples taken after fasting for at least 8 hours.    BUN 19 8 - 23 mg/dL    Creatinine, Ser 4.69 0.61 - 1.24 mg/dL    Calcium  8.4 (L) 8.9 - 10.3 mg/dL    GFR, Estimated >62 >95 mL/min      Comment: (NOTE) Calculated using the CKD-EPI Creatinine Equation (2021)      Anion gap 8 5 - 15      Comment: Performed at [facility], [address], [place], [place] [address]  Basic metabolic panel with GFR     Status: Abnormal    Collection Time:  10/09/23  5:37 AM  Result Value Ref Range    Sodium 132 (L) 135 - 145 mmol/L    Potassium 4.2 3.5 - 5.1 mmol/L    Chloride 99 98 - 111 mmol/L    CO2 24 22 - 32 mmol/L    Glucose, Bld 118 (H) 70 - 99 mg/dL      Comment: Glucose reference range applies only to samples taken after fasting for at least 8 hours.    BUN 20 8 - 23 mg/dL    Creatinine, Ser 4.09 0.61 - 1.24 mg/dL    Calcium  8.5 (L) 8.9 - 10.3 mg/dL    GFR, Estimated >81 >19 mL/min      Comment: (NOTE) Calculated using the CKD-EPI Creatinine Equation (2021)      Anion gap 9 5 - 15      Comment: Performed at [facility], [address] [place], [place] [address]  CBC with Differential/Platelet     Status: Abnormal    Collection Time: 10/09/23  5:37 AM  Result Value Ref Range    WBC 8.6 4.0 - 10.5 K/uL    RBC 3.52 (L) 4.22 - 5.81 MIL/uL    Hemoglobin 10.7 (L)  13.0 - 17.0 g/dL    HCT 95.6 (L) 21.3 - 52.0 %    MCV 87.2 80.0 - 100.0 fL    MCH 30.4 26.0 - 34.0 pg    MCHC 34.9 30.0 - 36.0 g/dL    RDW 08.6 57.8 - 46.9 %    Platelets 221 150 - 400 K/uL    nRBC 0.0 0.0 - 0.2 %    Neutrophils Relative % 78 %    Neutro Abs 6.7 1.7 - 7.7 K/uL    Lymphocytes Relative 9 %    Lymphs Abs 0.8 0.7 - 4.0 K/uL    Monocytes Relative 10 %    Monocytes Absolute 0.9 0.1 - 1.0 K/uL    Eosinophils Relative 0 %    Eosinophils Absolute 0.0 0.0 - 0.5 K/uL    Basophils Relative 0 %    Basophils Absolute 0.0 0.0 - 0.1 K/uL    Immature Granulocytes 3 %    Abs Immature Granulocytes 0.22 (H) 0.00 - 0.07 K/uL      Comment: Performed at [facility], [address] [place], [place] [address]       Imaging Results (Last 48 hours)  CT HEAD WO CONTRAST ( ) Result Date: 10/07/2023 CLINICAL DATA:  Provided history: Check resolution of shift. EXAM: CT HEAD WITHOUT CONTRAST TECHNIQUE: Contiguous axial images were obtained from the base of the skull through the vertex without intravenous contrast. RADIATION DOSE REDUCTION: This exam was performed according to the departmental dose-optimization program which includes automated exposure control, adjustment of the mA and/or kV according to patient size and/or use of iterative reconstruction technique. COMPARISON:  Prior head CT examinations 10/04/2023 and earlier. Brain MRI 09/30/2023. FINDINGS: Brain: Continued interval evolution of postoperative changes from recent prior resection of a left frontal mass and from subsequent left frontal parenchymal hematoma evacuation. Small patchy foci of parenchymal hemorrhage within the left frontal lobe (at site of prior left frontal mass resection) are non-progressed. Edema at this site has slightly progressed. Pneumocephalus persistent although decreased pneumocephalus scattered along the left greater than right frontal lobes. Residual mixed density subdural hematoma along the left cerebral  convexity, measuring up to 11 mm in thickness (for instance as seen on series 5, image 50). Subdural hemorrhage along the right cerebral convexity  measuring up to 3 mm in thickness, present on the prior head CT of 10/04/2023 but better appreciated on today's study (for instance as seen on series 5, image 25). Persistent mass effect with partial effacement of the left lateral ventricle. Rightward midline shift now measures 7-8 mm (previously 9 mm). Persistent trace hemorrhage within the occipital horn of the right lateral ventricle. Unchanged size and configuration of the ventricular system. Subdural hemorrhage along the mid and posterior falx, measuring up to 3 mm in thickness, similar to the prior CT. Vascular: No hyperdense vessel.  Atherosclerotic calcifications. Skull: Left-sided cranioplasty. Sinuses/Orbits: No orbital mass or acute orbital finding. No significant paranasal sinus disease. IMPRESSION: 1. Continued interval evolution of postoperative changes from recent left frontal mass resection, and subsequent left frontal parenchymal hematoma evacuation. 2. Patchy foci of parenchymal hemorrhage within the left frontal lobe, non-progressed. Surrounding parenchymal edema has slightly progressed. 3. Residual mixed density subdural hematoma along the left cerebral convexity, measuring up to 11 mm in thickness. 4. Persistent mass effect with partial effacement of the left lateral ventricle. Decreased rightward midline shift now measuring 7-8 mm (previously 9 mm). 5. Thin subdural hematoma along the right cerebral convexity (measuring up to 3 mm in thickness), non-progressed. 6. Trace hemorrhage within the right lateral ventricle occipital horn, unchanged. 7. Subdural hemorrhage along the mid and posterior falx (measuring up to 3 mm in thickness), also not significantly changed. Electronically Signed   By: Bascom Lily D.O.   On: 10/07/2023 15:01           Blood pressure 124/71, pulse 66, temperature 97.6 F  (36.4 C), temperature source Oral, resp. rate 18, height 5\' 11"  (1.803 m), weight 74.7 kg, SpO2 99%.   Medical Problem List and Plan: 1. Functional deficits secondary to traumatic SDH s/p evacuation x2             -patient may  shower -cover incision/crani             -ELOS/Goals: 9-12 days min A Admit to CIR 2.  Antithrombotics: -DVT/anticoagulation:  Pharmaceutical: Lovenox  added on 05/15.             -antiplatelet therapy: N/A 3. Pain Management:  Oxycodone  prn. Pt denies pain 4. Mood/Behavior/Sleep: LCSW to follow for evaluation and support.              -antipsychotic agents: N/A 5. Neuropsych/cognition: This patient may not be fully capable of making decisions on his own behalf. 6. Skin/Wound Care: Routine pressure relief Mesures.  7. Fluids/Electrolytes/Nutrition: Monitor I/O. Check CMET in a.              --monitor intake--did not want to eat "wife will feed me" 8. Vasovagal syncope/post ictal aphasia?: On keppra  750 mg BID per Dr. Arora --monitor for orthostatic changes. Will d/c Flomax per wife request 9. Abdominal distension: Denies pain and had BM yesterday --Hx of sigmoid volvulus 09/2017. Will check KUB. 10. Urinary retention: Hx of Prostate CA s/p prostatectomy/XRT --Will d/c flomax per multiple wife's request.  --Continue finasteride which was added on 05/13 11. Hyponatremia: Monitor for now. 12. PAF: Monitor HR TID--on amiodarone.  --Not on [facility] per patient, chart review and pharmacy review. 13. Acute blood loss anemia: Recheck CBC in am.  14. H/o LBP radiating to LLE/Neurogenic claudication: Monitor for symptoms with increase in activity.                       Zelda Hickman, PA-C 10/09/2023  I have personally performed a face to face diagnostic evaluation of this patient and formulated the key components of the plan.  Additionally, I have personally reviewed laboratory data, imaging studies, as well as relevant notes and concur with the physician assistant's  documentation above.   The patient's status has not changed from the original H&P.  Any changes in documentation from the acute care chart have been noted above.

## 2023-10-10 NOTE — Progress Notes (Signed)
 Inpatient Rehab Admissions Coordinator:   Pt cleared to come to CIR today by Dr. Macarthur Savory.  TOC aware and I will let pt/family know.  Please see PMR Pre-Admission Screen signed yesterday by Dr. Rayleen Cal.    Loye Rumble, PT, DPT Admissions Coordinator [phone] 10/10/23  9:51 AM

## 2023-10-10 NOTE — Progress Notes (Signed)
 Jesse Sand, [name]  Physician Physical Medicine and Rehabilitation   PMR Pre-admission    Signed   Date of Service: 10/09/2023 10:53 AM  Related encounter: ED to Hosp-Admission (Discharged) from 09/30/2023 in [facility]   Signed     Expand All Collapse All  PMR Admission Coordinator Pre-Admission Assessment   Patient: Jesse [name] is an 83 y.o., male MRN: [id number] DOB: April 02, 1941 Height: 5\' 11"  (180.3 cm) Weight: 74.7 kg   Insurance Information HMO:     PPO:      PCP:      IPA:      80/20:      OTHER:  PRIMARY: Medicare Part [name] and B      Policy#: [id number]       Subscriber: pt CM Name:       Phone#:      Fax#:  Pre-Cert#: verified Health and safety inspector:  Benefits:  Phone #:     Name:  Eff. Date: [name] 09/25/04, B 07/27/07     Deduct: $1676      Out of Pocket Max: n/[name]      Life Max: n/[name] CIR: 100%      SNF: 20 full days Outpatient: 80%     Co-Pay: 20% Home Health: 100%      Co-Pay:  DME: 80%     Co-Pay: 20% Providers:  SECONDARY: BC[employer] Other      Policy#: [id number]     Phone#: [phone]   Financial Counselor:       Phone#:    The "Data Collection Information Summary" for patients in Inpatient Rehabilitation Facilities with attached "Privacy Act Statement-Health Care Records" was provided and verbally reviewed with: Patient and Family   Emergency Contact Information Contact Information       Name Relation Home Work Mobile    Jesse [name] Spouse [phone]             Other Contacts   None on File        Current Medical History  Patient Admitting Diagnosis: meningioma s/p crani resection    History of Present Illness: Pt is an 83 y/o male with PMH of dementia, atrial fibrillation, hypothyroidism, prostate cancer and meningioma for which he has previously refused resection.  He presented to [name] on 09/30/23 after an unwitnessed fall at home.  Wife notes he was found down on top of his walker with blood on his shirt, and after  fall legs seemed to not work.  In ED BP elevated to 170/108, sodium 128, chloride 95, CO2 21.  CT imaging of head and cspine revealed known heterogenous and hemorrhagic mass along the left frontal cortex with [name] 1.5 cm rightward shift which is significantly increased in size from previous imaging (2019) with 7mm edema within the collection suggestive of acute hemorrhage.  Neurosurgery was consulted.  Per Dr. Ruthann [name] discussion with the spouse pt has had an acute deterioration over the previous few weeks.  He recommended surgical resection of the tumor and pt was agreeable.  Pt underwent craniotomy with resection of meningioma and evacuation of SDH on 5/8 per Dr. Ellery [name].  Post op course complicated by decreasing responsiveness and perseverative.  Stat CT showed accumulated acute subdural hematoma with increasing shift and mass effect.  He was taken back to the OR for revision of his crani with evacuation of new SDH on 5/9 per Dr. Ellery [name].  Post op scans show expected post op evolution of L  frontal mass with slight mass effect on the left lateral ventricle.  He is on decadron  and keppra  BID per NS recs.  Eliquis resumed for afib on 5/13.  Voiding trial on 5/13 and pt requiring intermittent catheterization.  Therapy ongoing and pt has been recommended for CIR.    Complete NIHSS TOTAL: 2   Patient's medical record from [name] has been reviewed by the rehabilitation admission coordinator and physician.   Past Medical History      Past Medical History:  Diagnosis Date   Arthritis     Atrial fibrillation (HCC)     Complication of anesthesia      Atrial fibrillation during surgery    Dementia (HCC)     Depression     Hard of hearing     Hx of constipation     Osteoporosis      DEXA 11/09/10. Completed treatemtn with Fosamax.   Prostate cancer (HCC)     Restless legs     Thyroid  disease      hypothyroidism   Vitiligo            Has the patient had major surgery during 100 days prior to  admission? Yes   Family History   family history includes Atrial fibrillation in his mother and sister; Cystic fibrosis in his mother.   Current Medications  Current Medications    Current Facility-Administered Medications:    0.9 %  sodium chloride  infusion (Manually program via Guardrails IV Fluids), , Intravenous, Once, Jesse [name]   acetaminophen  (TYLENOL ) tablet 650 mg, 650 mg, Oral, Q6H PRN, 650 mg at 10/06/23 1629 **OR** [DISCONTINUED] acetaminophen  (TYLENOL ) suppository 650 mg, 650 mg, Rectal, Q6H PRN, Jesse [name]   bisacodyl  (DULCOLAX) suppository 10 mg, 10 mg, Rectal, Daily PRN, Jesse [name], 10 mg at 10/06/23 1500   ceFAZolin  (ANCEF ) IVPB 2g/100 mL premix, 2 g, Intravenous, Q8H, Jesse [name], Last Rate: 200 mL/hr at 10/09/23 0840, 2 g at 10/09/23 0840   Chlorhexidine  Gluconate Cloth 2 % PADS 6 each, 6 each, Topical, Daily, Jesse [name], 6 each at 10/08/23 0907   dexamethasone  (DECADRON ) injection 2 mg, 2 mg, Intravenous, Q12H, Jesse [name], 2 mg at 10/09/23 2841   docusate sodium  (COLACE) capsule 100 mg, 100 mg, Oral, BID, Jesse [name], 100 mg at 10/09/23 0834   finasteride (PROSCAR) tablet 5 mg, 5 mg, Oral, Daily, Jesse [name], 5 mg at 10/09/23 3244   labetalol  (NORMODYNE ) injection 10-40 mg, 10-40 mg, Intravenous, Q10 min PRN, Jesse [name], 40 mg at 10/04/23 2209   levETIRAcetam  (KEPPRA ) tablet 500 mg, 500 mg, Oral, BID, Jesse [name], 500 mg at 10/09/23 0102   levothyroxine  (SYNTHROID ) tablet 50 mcg, 50 mcg, Oral, Q0600, Jesse [name], 50 mcg at 10/09/23 0545   melatonin tablet 5 mg, 5 mg, Oral, QHS PRN, Jesse [name]   oxyCODONE  (Oxy IR/ROXICODONE ) immediate release tablet 5 mg, 5 mg, Oral, Q6H PRN, Jesse [name]   pantoprazole  (PROTONIX ) EC tablet 40 mg, 40 mg, Oral, QHS, Jesse [name], 40 mg at 10/08/23 2056   polyethylene glycol (MIRALAX  / GLYCOLAX ) packet 17 g, 17 g, Oral, Daily  PRN, Jesse [name], 17 g at 10/06/23 1323   prochlorperazine  (COMPAZINE ) injection 5 mg, 5 mg, Intravenous, Q6H PRN, Jesse [name]   senna (SENOKOT) tablet 8.6 mg, 1 tablet, Oral, BID, Jesse [name], 8.6 mg at 10/09/23 0834   sodium phosphate  (FLEET) enema  1 enema, 1 enema, Rectal, Once PRN, Jesse [name]   tamsulosin (FLOMAX) capsule 0.4 mg, 0.4 mg, Oral, QPC breakfast, Jesse [name], 0.4 mg at 10/09/23 2952     Patients Current Diet:  Diet Order                  Diet regular Room service appropriate? Yes with Assist; Fluid consistency: Thin  Diet effective now                         Precautions / Restrictions Precautions Precautions: Fall Precaution/Restrictions Comments: L crani Restrictions Weight Bearing Restrictions Per Provider Order: No    Has the patient had 2 or more falls or [name] fall with injury in the past year? Yes   Prior Activity Level Limited Community (1-2x/wk): per spouse, mod I with RW for mobility and ADLs   Prior Functional Level Self Care: Did the patient need help bathing, dressing, using the toilet or eating? Independent   Indoor Mobility: Did the patient need assistance with walking from room to room (with or without device)? Independent   Stairs: Did the patient need assistance with internal or external stairs (with or without device)? Independent   Functional Cognition: Did the patient need help planning regular tasks such as shopping or remembering to take medications? Needed some help   Patient Information Are you of Hispanic, Latino/[name],or Spanish origin?: [name]. No, not of Hispanic, Latino/[name], or Spanish origin What is your race?: [name]. White Do you need or want an interpreter to communicate with [name] doctor or health care staff?: 0. No   Patient's Response To:  Health Literacy and Transportation Is the patient able to respond to health literacy and transportation needs?: Yes Health Literacy - How often do you need to have  someone help you when you read instructions, pamphlets, or other written material from your doctor or pharmacy?: Never In the past 12 months, has lack of transportation kept you from medical appointments or from getting medications?: No In the past 12 months, has lack of transportation kept you from meetings, work, or from getting things needed for daily living?: No   Home Assistive Devices / Equipment Home Equipment: Agricultural consultant (2 wheels), BSC/3in1, Wheelchair - manual   Prior Device Use: Indicate devices/aids used by the patient prior to current illness, exacerbation or injury? Walker   Current Functional Level Cognition   Orientation Level: Oriented X4    Extremity Assessment (includes Sensation/Coordination)   Upper Extremity Assessment: Generalized weakness, RUE deficits/detail RUE Deficits / Details: 4-/5 as compared to L 4/5  Lower Extremity Assessment: Defer to PT evaluation RLE Deficits / Details: grossly 4/5 to MMT, reports no change in sensation. needs further testing when pt needing less hands-on assist for balance. functional without buckling to stand RLE Sensation: WNL     ADLs   Overall ADL's : Needs assistance/impaired Eating/Feeding: Set up, Sitting Grooming: Minimal assistance, Standing Grooming Details (indicate cue type and reason): intermittent min [name] for balance secondary to R lateral lean Upper Body Bathing: Contact guard assist, Sitting Lower Body Bathing: Sitting/lateral leans, Minimal assistance Upper Body Dressing : Sitting, Set up Lower Body Dressing: Sit to/from stand, Minimal assistance Toilet Transfer: Minimal assistance, Ambulation, Rolling walker (2 wheels) Toilet Transfer Details (indicate cue type and reason): min [name] for balance Toileting- Clothing Manipulation and Hygiene: Sit to/from stand, Moderate assistance Functional mobility during ADLs: Minimal assistance, Rolling walker (2 wheels)     Mobility  Overal bed mobility: Needs  Assistance Bed Mobility: Supine to Sit Supine to sit: Supervision Sit to supine: Mod assist, HOB elevated, Used rails General bed mobility comments: no physical assistance provided.     Transfers   Overall transfer level: Needs assistance Equipment used: Rolling walker (2 wheels) Transfers: Sit to/from Stand Sit to Stand: Min assist, +2 safety/equipment Bed to/from chair/wheelchair/BSC transfer type:: Step pivot Step pivot transfers: Mod assist General transfer comment: cues for hand placement, light assist for balance on rise     Ambulation / Gait / Stairs / Wheelchair Mobility   Ambulation/Gait Ambulation/Gait assistance: Min assist, +2 safety/equipment Gait Distance (Feet): 20 Feet Assistive device: Rolling walker (2 wheels) Gait Pattern/deviations: Step-to pattern, Decreased stride length General Gait Details: Pt able to ambulate in room with RW Min [name]. +2 for safety. no LOB noted. Able to stand at sink with OT for ~5 minutes. Gait velocity: decreased Gait velocity interpretation: <1.31 ft/sec, indicative of household ambulator Stairs: Yes Stairs assistance: Min assist Stair Management: Two rails, Alternating pattern, Step to pattern, Forwards Number of Stairs: 2 General stair comments: pt performed alternating gait pattern ascending the stairs and step to gait pattern descending the stairs.     Posture / Balance Dynamic Sitting Balance Sitting balance - Comments: statically Balance Overall balance assessment: History of Falls, Needs assistance Sitting-balance support: Feet supported, Bilateral upper extremity supported Sitting balance-Leahy Scale: Fair Sitting balance - Comments: statically Standing balance support: Reliant on assistive device for balance, Bilateral upper extremity supported Standing balance-Leahy Scale: Poor Standing balance comment: dependent on at least one UE for balance statically and RW dynamically as well as external support     Special needs/care  consideration Skin crani incision  and Behavioral consideration past history of dementia and brain tumor    Previous Home Environment (from acute therapy documentation) Living Arrangements: Spouse/significant other Available Help at Discharge: Family Type of Home: House Home Layout: Two level, 1/2 bath on main level, Able to live on main level with bedroom/bathroom Home Access: Level entry Bathroom Shower/Tub: Walk-in shower (upstairs) Bathroom Toilet: Handicapped height Home Care Services: No   Discharge Living Setting Plans for Discharge Living Setting: Patient's home, Lives with (comment) (spouse) Type of Home at Discharge: House Discharge Home Layout: One level, Laundry or work area in basement Discharge Home Access: Stairs to enter Entrance Stairs-Rails: None Secretary/administrator of Steps: 2-3 Discharge Bathroom Shower/Tub: Walk-in shower Discharge Bathroom Toilet: Handicapped height Discharge Bathroom Accessibility: Yes How Accessible: Accessible via walker Does the patient have any problems obtaining your medications?: No   Social/Family/Support Systems Patient Roles: Spouse Anticipated Caregiver: [name] Anticipated Industrial/product designer Information: [phone] Ability/Limitations of Caregiver: min assist Caregiver Availability: 24/7 Discharge Plan Discussed with Primary Caregiver: Yes Is Caregiver In Agreement with Plan?: Yes Does Caregiver/Family have Issues with Lodging/Transportation while Pt is in Rehab?: No   Goals Patient/Family Goal for Rehab: PT/OT/SLP supervision to min assist Expected length of stay: 9-12 days Additional Information: DIscharge plan: home with pt's spouse and she can provide 24/7, up to min assist for mobility and ADLs Pt/Family Agrees to Admission and willing to participate: Yes Program Orientation Provided & Reviewed with Pt/Caregiver Including Roles  & Responsibilities: Yes   Decrease burden of Care through IP rehab admission: n/[name]    Possible need for SNF placement upon discharge:  Not anticipated.  Plan for d/c home with spouse who can provide 24/7 min assist.    Patient Condition: I have reviewed medical records from [facility], spoken with  TOC team, and patient and spouse. I met with patient at the bedside for inpatient rehabilitation assessment.  Patient will benefit from ongoing PT, OT, and SLP, can actively participate in 3 hours of therapy [name] day 5 days of the week, and can make measurable gains during the admission.  Patient will also benefit from the coordinated team approach during an Inpatient Acute Rehabilitation admission.  The patient will receive intensive therapy as well as Rehabilitation physician, nursing, social worker, and care management interventions.  Due to bladder management, safety, skin/wound care, disease management, medication administration, pain management, and patient education the patient requires 24 hour [name] day rehabilitation nursing.  The patient is currently min assist with mobility and basic ADLs.  Discharge setting and therapy post discharge at home with home health is anticipated.  Patient has agreed to participate in the Acute Inpatient Rehabilitation Program and will admit today.   Preadmission Screen Completed By:  Mickey Alar, PT, DPT 10/09/2023 10:53 AM ______________________________________________________________________   Discussed status with Dr. Rayleen Cal on 10/09/23  at 11:20 AM  and received approval for admission today.   Admission Coordinator:  Maeson Purohit E Seibert Keeter, PT, time 11:20 AM Alanna Hu 10/09/23     Assessment/Plan: Diagnosis: meningioma s/p crani  Does the need for close, 24 hr/day Medical supervision in concert with the patient's rehab needs make it unreasonable for this patient to be served in [name] less intensive setting? Yes Co-Morbidities requiring supervision/potential complications: Afib, Hypothyroid, Anemia, hx of prostate cancer Due to bladder management, bowel  management, safety, skin/wound care, disease management, medication administration, pain management, and patient education, does the patient require 24 hr/day rehab nursing? Yes Does the patient require coordinated care of [name] physician, rehab nurse, PT, OT, and SLP to address physical and functional deficits in the context of the above medical diagnosis(es)? Yes Addressing deficits in the following areas: balance, endurance, locomotion, strength, transferring, bowel/bladder control, bathing, dressing, feeding, grooming, toileting, cognition, speech, language, and psychosocial support Can the patient actively participate in an intensive therapy program of at least 3 hrs of therapy 5 days [name] week? Yes The potential for patient to make measurable gains while on inpatient rehab is excellent Anticipated functional outcomes upon discharge from inpatient rehab: supervision and min assist PT, supervision and min assist OT, supervision and min assist SLP Estimated rehab length of stay to reach the above functional goals is: 9-12 Anticipated discharge destination: Home 10. Overall Rehab/Functional Prognosis: good     [name] Signature: Jesse [name]          Revision History

## 2023-10-10 NOTE — Plan of Care (Signed)
 Care plan completed. Pt pending d/c to CIR

## 2023-10-10 NOTE — Progress Notes (Signed)
 Care plan completed. Pt is alert and oriented x 4. NIH 1 best language. Bp soft at hs. 93/50 map 63. On call MD Howerter Notified and LR bolus ordered and given. Rechecked vitals and improved. Pt took po meds without problem. Foley intact and draining clear yellow urine. SCD in place. Last bm 5/14. Turning every 2 hour 1 assist.

## 2023-10-10 NOTE — Progress Notes (Signed)
   10/10/23 1624  Orthostatic Lying   BP- Lying 101/62  Pulse- Lying 74  Orthostatic Sitting  BP- Sitting 100/64  Pulse- Sitting 59  Orthostatic Standing at 0 minutes  BP- Standing at 0 minutes 110/66  Pulse- Standing at 0 minutes 109

## 2023-10-10 NOTE — Progress Notes (Signed)
 NEUROLOGY CONSULT FOLLOW UP NOTE   Date of service: Oct 10, 2023 Patient Name: Jesse [name] MRN:  [id number] DOB:  1941/04/18  Interval Hx/subjective  Seen and examined Episode of hypotension overnight but symptomatically getting better overall  Vitals   Vitals:   10/09/23 2350 10/10/23 0001 10/10/23 0330 10/10/23 0747  BP: 118/70 93/73 91/68  105/70  Pulse: 70 68 69 70  Resp: 16 18 16 17   Temp: 98.6 F (37 C) 98 F (36.7 C) 97.7 F (36.5 C) 97.7 F (36.5 C)  TempSrc: Oral Oral Oral Oral  SpO2: 98% 97% 96% 98%  Weight:      Height:         Body mass index is 22.97 kg/m.  Physical Exam   General: Comfortably sitting in bed HEENT: Staples in place, wound clean dry and intact Neurological exam He is awake alert oriented to the fact that he is in the hospital, could not tell me his age correctly. Mild dysarthria. No evidence of aphasia Diminished attention concentration Cranial nerves II through XII grossly intact Motor examination with antigravity strength in all fours Sensation intact to light touch No ataxia   Medications  Current Facility-Administered Medications:    0.9 %  sodium chloride  infusion (Manually program via Guardrails IV Fluids), , Intravenous, Once, Elna Haggis, MD   acetaminophen  (TYLENOL ) tablet 650 mg, 650 mg, Oral, Q6H PRN, 650 mg at 10/06/23 1629 **OR** [DISCONTINUED] acetaminophen  (TYLENOL ) suppository 650 mg, 650 mg, Rectal, Q6H PRN, Felipe Horton, Rondell A, MD   bisacodyl  (DULCOLAX) suppository 10 mg, 10 mg, Rectal, Daily PRN, Elna Haggis, MD, 10 mg at 10/06/23 1500   ceFAZolin  (ANCEF ) IVPB 2g/100 mL premix, 2 g, Intravenous, Q8H, Elsner, Ace Abu, MD, Last Rate: 200 mL/hr at 10/09/23 2352, 2 g at 10/09/23 2352   Chlorhexidine  Gluconate Cloth 2 % PADS 6 each, 6 each, Topical, Daily, Elna Haggis, MD, 6 each at 10/09/23 1445   dexamethasone  (DECADRON ) injection 2 mg, 2 mg, Intravenous, Q12H, Elsner, Ace Abu, MD, 2 mg at 10/09/23 2211   docusate  sodium (COLACE) capsule 100 mg, 100 mg, Oral, BID, Elna Haggis, MD, 100 mg at 10/09/23 2211   finasteride (PROSCAR) tablet 5 mg, 5 mg, Oral, Daily, Samtani, Jai-Gurmukh, MD, 5 mg at 10/09/23 4098   labetalol  (NORMODYNE ) injection 10-40 mg, 10-40 mg, Intravenous, Q10 min PRN, Elna Haggis, MD, 40 mg at 10/04/23 2209   levETIRAcetam  (KEPPRA ) tablet 500 mg, 500 mg, Oral, BID, Cleven Dallas, RPH, 500 mg at 10/09/23 2211   levothyroxine  (SYNTHROID ) tablet 50 mcg, 50 mcg, Oral, Q0600, Elna Haggis, MD, 50 mcg at 10/10/23 0539   melatonin tablet 5 mg, 5 mg, Oral, QHS PRN, Elna Haggis, MD   oxyCODONE  (Oxy IR/ROXICODONE ) immediate release tablet 5 mg, 5 mg, Oral, Q6H PRN, Harris, Whitney D, NP   pantoprazole  (PROTONIX ) EC tablet 40 mg, 40 mg, Oral, QHS, Mdala-Gausi, Masiku Agatha, MD, 40 mg at 10/09/23 2211   polyethylene glycol (MIRALAX  / GLYCOLAX ) packet 17 g, 17 g, Oral, Daily PRN, Elna Haggis, MD, 17 g at 10/06/23 1323   prochlorperazine  (COMPAZINE ) injection 5 mg, 5 mg, Intravenous, Q6H PRN, Elna Haggis, MD   senna (SENOKOT) tablet 8.6 mg, 1 tablet, Oral, BID, Elna Haggis, MD, 8.6 mg at 10/09/23 2211   sodium chloride  flush (NS) 0.9 % injection 3-10 mL, 3-10 mL, Intravenous, Q12H, Shafer, Devon, NP, 10 mL at 10/09/23 2212   sodium chloride  flush (NS) 0.9 % injection 3-10 mL, 3-10 mL, Intravenous, PRN, Imogene Mana, NP  sodium phosphate  (FLEET) enema 1 enema, 1 enema, Rectal, Once PRN, Elna Haggis, MD   tamsulosin (FLOMAX) capsule 0.4 mg, 0.4 mg, Oral, QPC breakfast, Harris, Whitney D, NP, 0.4 mg at 10/09/23 0834  Labs and Diagnostic Imaging   CBC:  Recent Labs  Lab 10/09/23 0537 10/10/23 0613  WBC 8.6 8.9  NEUTROABS 6.7 7.0  HGB 10.7* 10.3*  HCT 30.7* 30.2*  MCV 87.2 88.6  PLT 221 217    Basic Metabolic Panel:  Lab Results  Component Value Date   NA 132 (L) 10/09/2023   K 4.2 10/09/2023   CO2 24 10/09/2023   GLUCOSE 118 (H) 10/09/2023   BUN 20 10/09/2023    CREATININE 0.81 10/09/2023   CALCIUM  8.5 (L) 10/09/2023   GFRNONAA >60 10/09/2023   GFRAA >60 10/01/2017   Lipid Panel:  Lab Results  Component Value Date   LDLCALC 86 08/26/2020   HgbA1c:  Lab Results  Component Value Date   HGBA1C 5.7 08/26/2020   Urine Drug Screen:     Component Value Date/Time   LABOPIA NONE DETECTED 06/13/2017 1812   COCAINSCRNUR NONE DETECTED 06/13/2017 1812   LABBENZ NONE DETECTED 06/13/2017 1812   AMPHETMU NONE DETECTED 06/13/2017 1812   THCU NONE DETECTED 06/13/2017 1812   LABBARB NONE DETECTED 06/13/2017 1812    Alcohol Level     Component Value Date/Time   ETH <10 06/13/2017 1737   INR  Lab Results  Component Value Date   INR 1.2 10/04/2023   APTT  Lab Results  Component Value Date   APTT 26 10/04/2023  Imaging personally reviewed  5/12 CT Head without contrast 1. Continued interval evolution of postoperative changes from recent left frontal mass resection, and subsequent left frontal parenchymal hematoma evacuation. 2. Patchy foci of parenchymal hemorrhage within the left frontal lobe, non-progressed. Surrounding parenchymal edema has slightly progressed. 3. Residual mixed density subdural hematoma along the left cerebral convexity, measuring up to 11 mm in thickness.  4. Persistent mass effect with partial effacement of the left lateral ventricle. Decreased rightward midline shift now measuring 7-8 mm (previously 9 mm). 5. Thin subdural hematoma along the right cerebral convexity (measuring up to 3 mm in thickness), non-progressed. 6. Trace hemorrhage within the right lateral ventricle occipital horn, unchanged. 7. Subdural hemorrhage along the mid and posterior falx (measuring up to 3 mm in thickness), also not significantly changed.   5/14 CT Head without contrast: IMPRESSION: 1. Redemonstrated postsurgical changes of left frontotemporal craniotomy for resection of left frontal lobe mass and parenchymal hematoma evacuation. 2.  Similar appearance of multi compartment intracranial hemorrhage. No new or enlarging foci of intracranial hemorrhage. 3. Similar mass effect with approximately 9 mm rightward midline shift. 4. ASPECTS is 10   CT angio Head and Neck with contrast IMPRESSION: No large vessel occlusion.  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  Accounting for differences in modality there is slightly decreased mass effect on the distal ACA branches compared to prior MRA.  1.5 mm outpouching along the right supraclinoid ICA at the origin of the posterior communicating artery which may reflect an infundibular origin versus less likely small aneurysm.  Atherosclerosis in the neck as above without high-grade stenosis.  Focal severe stenosis at the origin of the non dominant left vertebral artery on the aortic arch.  MRI brain with and without contrast Status post resection of the anterior left convexity meningioma.  No residual contrast or enhancing mass.  Left convexity subdural hematoma  7 mm.  Posterior right convexity subdural hematoma 2 mm.  Rightward midline shift of approximately 9 mm-improved from prior   Routine EEG: Suggestive of cortical dysfunction from the left hemisphere likely secondary to underlying structural abnormality/subdural hematoma.  No seizures or epileptiform discharges Assessment   DALVIN CONGER is a 83 y.o. male who is status post resection of a meningioma and evacuation of subdural hematoma on the left convexity, was seen in emergent consultation for acute change in his neurological status-sudden onset of aphasia and altered mental status.  Episode happened around the time he had a large bowel movement with his systolic blood pressure down into the 80s. Code stroke process was undertaken-no acute findings on the CT or CT angio head and neck.  Eventually the MRI of the brain was done that confirmed no stroke. Antiepileptic dose was increased He appears to  be doing better and is currently at neurological baseline.  Impression: Likely a syncopal/vagal response versus a breakthrough seizure from the left hemisphere causing postictal aphasia.  Recommendations  Continue the increased dose of Keppra  750 twice daily. Avoid hypotension Maintain seizure precautions Management of the surgical site per neurosurgery  Please call with questions as needed  Plan relayed to Dr. Macarthur Savory ______________________________________________________________________   Signed, Tona Francis, MD [facility]

## 2023-10-11 ENCOUNTER — Inpatient Hospital Stay (HOSPITAL_COMMUNITY)

## 2023-10-11 DIAGNOSIS — K560 Paralytic ileus: Secondary | ICD-10-CM

## 2023-10-11 DIAGNOSIS — R339 Retention of urine, unspecified: Secondary | ICD-10-CM

## 2023-10-11 DIAGNOSIS — I480 Paroxysmal atrial fibrillation: Secondary | ICD-10-CM

## 2023-10-11 DIAGNOSIS — R55 Syncope and collapse: Secondary | ICD-10-CM

## 2023-10-11 LAB — CBC WITH DIFFERENTIAL/PLATELET
Abs Immature Granulocytes: 0.21 10*3/uL — ABNORMAL HIGH (ref 0.00–0.07)
Basophils Absolute: 0 10*3/uL (ref 0.0–0.1)
Basophils Relative: 0 %
Eosinophils Absolute: 0 10*3/uL (ref 0.0–0.5)
Eosinophils Relative: 0 %
HCT: 29.4 % — ABNORMAL LOW (ref 39.0–52.0)
Hemoglobin: 10.1 g/dL — ABNORMAL LOW (ref 13.0–17.0)
Immature Granulocytes: 2 %
Lymphocytes Relative: 10 %
Lymphs Abs: 1 10*3/uL (ref 0.7–4.0)
MCH: 30.2 pg (ref 26.0–34.0)
MCHC: 34.4 g/dL (ref 30.0–36.0)
MCV: 88 fL (ref 80.0–100.0)
Monocytes Absolute: 1.1 10*3/uL — ABNORMAL HIGH (ref 0.1–1.0)
Monocytes Relative: 11 %
Neutro Abs: 7.8 10*3/uL — ABNORMAL HIGH (ref 1.7–7.7)
Neutrophils Relative %: 77 %
Platelets: 235 10*3/uL (ref 150–400)
RBC: 3.34 MIL/uL — ABNORMAL LOW (ref 4.22–5.81)
RDW: 13.6 % (ref 11.5–15.5)
WBC: 10.2 10*3/uL (ref 4.0–10.5)
nRBC: 0 % (ref 0.0–0.2)

## 2023-10-11 LAB — COMPREHENSIVE METABOLIC PANEL WITH GFR
ALT: 9 U/L (ref 0–44)
AST: 22 U/L (ref 15–41)
Albumin: 2.9 g/dL — ABNORMAL LOW (ref 3.5–5.0)
Alkaline Phosphatase: 48 U/L (ref 38–126)
Anion gap: 7 (ref 5–15)
BUN: 21 mg/dL (ref 8–23)
CO2: 24 mmol/L (ref 22–32)
Calcium: 8.3 mg/dL — ABNORMAL LOW (ref 8.9–10.3)
Chloride: 99 mmol/L (ref 98–111)
Creatinine, Ser: 0.87 mg/dL (ref 0.61–1.24)
GFR, Estimated: >60 mL/min (ref >60)
Glucose, Bld: 112 mg/dL — ABNORMAL HIGH (ref 70–99)
Potassium: 4 mmol/L (ref 3.5–5.1)
Sodium: 130 mmol/L — ABNORMAL LOW (ref 135–145)
Total Bilirubin: 0.5 mg/dL (ref 0.0–1.2)
Total Protein: 5.2 g/dL — ABNORMAL LOW (ref 6.5–8.1)

## 2023-10-11 MED ORDER — LEVETIRACETAM 500 MG PO TABS
500.0000 mg | ORAL_TABLET | Freq: Two times a day (BID) | ORAL | Status: DC
  Administered 2023-10-11 – 2023-10-15 (×8): 500 mg via ORAL
  Filled 2023-10-11 (×8): qty 1

## 2023-10-11 MED ORDER — POTASSIUM CHLORIDE IN NACL 40-0.9 MEQ/L-% IV SOLN
INTRAVENOUS | Status: AC
  Filled 2023-10-11 (×2): qty 1000

## 2023-10-11 MED ORDER — CHLORHEXIDINE GLUCONATE CLOTH 2 % EX PADS
6.0000 | MEDICATED_PAD | Freq: Two times a day (BID) | CUTANEOUS | Status: DC
  Administered 2023-10-12 – 2023-10-22 (×20): 6 via TOPICAL

## 2023-10-11 NOTE — Progress Notes (Signed)
 Pt peripheral IV on right forearm was found dislodged and sheet soaked in blood. No active bleeding at this time. Dan A. (PA) notified. CBC pending.   Pt refused to d/c foley this am. Stated his wife needs to be here before he will allowed it. Provided CAUTI education and the need to d/c foley per provider's order.    Bertram Brocks    RN

## 2023-10-11 NOTE — Evaluation (Signed)
 Occupational Therapy Assessment and Plan  Patient Details  Name: Jesse [name] MRN: [id number] Date of Birth: 06-02-1940  OT Diagnosis: abnormal posture, acute pain, altered mental status, muscle weakness (generalized), and decreased activity tolerance Rehab Potential: Rehab Potential (ACUTE ONLY): Fair ELOS: 10-12 days   Today's Date: 10/11/2023 OT Individual Time: 6962-9528 OT Individual Time Calculation (min): 55 min     Hospital Problem: Principal Problem:   SDH (subdural hematoma) (HCC) Active Problems:   Status post resection of meningioma   Past Medical History:  Past Medical History:  Diagnosis Date   Arthritis    Atrial fibrillation (HCC)    Complication of anesthesia    Atrial fibrillation during surgery    Dementia (HCC)    Depression    Hard of hearing    Hx of constipation    Osteoporosis    DEXA 11/09/10. Completed treatemtn with Fosamax.   Prostate cancer (HCC)    Restless legs    Thyroid  disease    hypothyroidism   Vitiligo    Past Surgical History:  Past Surgical History:  Procedure Laterality Date   BACK SURGERY     COLONOSCOPY     CRANIOTOMY Left 10/03/2023   Procedure: CRANIOTOMY TUMOR EXCISION LEFT FRONTAL;  Surgeon: Elna Haggis, MD;  Location: [facility] OR;  Service: Neurosurgery;  Laterality: Left;  Left Frontal Craniotomy for Menigioma   CRANIOTOMY N/A 10/04/2023   Procedure: CRANIOTOMY HEMATOMA EVACUATION SUBDURAL;  Surgeon: Elna Haggis, MD;  Location: [facility] OR;  Service: Neurosurgery;  Laterality: N/A;  REPEAT   FLEXIBLE SIGMOIDOSCOPY N/A 09/27/2017   Procedure: FLEXIBLE SIGMOIDOSCOPY;  Surgeon: Kenney Peacemaker, MD;  Location: [facility] ENDOSCOPY;  Service: Endoscopy;  Laterality: N/A;   JOINT REPLACEMENT     LAMINECTOMY  10/2011   cervical and lumbar laminectomy   PROSTATE BIOPSY  07/10/2011   PROSTATE BIOPSY  09/22/15   TOTAL HIP ARTHROPLASTY Right 02/29/2012   TOTAL HIP ARTHROPLASTY Right 07/04/2016   Procedure: RIGHT TOTAL HIP ARTHROPLASTY ANTERIOR  APPROACH;  Surgeon: Adonica Hoose, MD;  Location: [facility] OR;  Service: Orthopedics;  Laterality: Right;   TRANSURETHRAL RESECTION OF PROSTATE      Assessment & Plan Clinical Impression: Patient is an "83 year old R handed male with history of A fib, RLS, dementia, Prostate CA, meningioma dx 2016 (asymptomatic and had refused excision) who was admitted to [facility] on 09/30/23 with one week history of progressive weakness with cognitive change and unwitnessed fall. He was found to have large heterogenous and hemorrhagic extra axial mass along left frontal convexity with 1.5 cm rightward midline shift and 7 mm subdural hemorrhage felt to be acute on chronic. MRA brain showed patent intracranial arterial vasculature with mild mass effect on A4 and A5 braches of ACA secondary to large mass and MRI showed 6.1 cm extra-axial mass most likely aggressive meningioma as well as L>R SDH.  Dr. Ellery Guthrie consulted and patient agreeable to undergo surgical intervention. Palliative care consulted and he elected on full scope of care. He underwent  crani for resection of left frontal brain tumor and for evacuation of SDH on 10/03/23. Post op course complicated by mental status changes with decrease in verbal out put and was found to have accumulation of bleed with mass effect.  He received 2 units FFP and was taken back to OR on 05/09 for revision craniotomy and evacuation of SDH. Post op decadron  taper and Keppra  for seizure prophylaxis.   Hospital course significant for urinary retention requiring I/O caths on 05/10 therefore  foley placed. Left sided weakness has resolved with improvement in mentation and verbal output. He was noted to have mild right sided weakness by Dr. Ellery Guthrie on 05/12. Repeat CT head done revealing post op evolution on left with no progression in hemorrhage, slight progression of parenchymal edema, residual SDH left cerebral convexity with partial effacement of left lateral ventricle, decrease in rightward  midline shift and no other significant changes. Mild hyponatremia being monitored. Post op has had issues with constipation. He did have vasovagal episode yesterday after large BM while in [place] with his wife and was altered, dysarthric and aphasic. SBP noted to be in 80's and was bolused with IVF. CT/CTA  head negative for LVO and revealed severe stenosis of M2 superior division of R-MCA and focal severe stenosis at origin of non dominant L-VA on aortic arch. EEG showed left cortical dysfunction without seizures. Dr. Bonnita Buttner questioned vasovagal syncope v/s breakthrough seizure left hemisphere causing post ictal aphasia and recommended increasing Keppra  to 750 mg BID and avoid hypotension." Patient transferred to CIR on 10/10/2023 .    Patient currently requires mod with basic self-care skills secondary to muscle weakness and muscle joint tightness, decreased cardiorespiratoy endurance, decreased awareness, decreased safety awareness, and decreased memory, and decreased sitting balance, decreased standing balance, decreased postural control, and decreased balance strategies.  Prior to hospitalization, patient could complete BADLs with an anticipated Min A.   Patient will benefit from skilled intervention to decrease level of assist with basic self-care skills prior to discharge home with care partner.  Anticipate patient will require 24 hour supervision and follow up home health.  OT - End of Session Activity Tolerance: Tolerates < 10 min activity with changes in vital signs Endurance Deficit: Yes OT Assessment Rehab Potential (ACUTE ONLY): Fair OT Barriers to Discharge: Decreased caregiver support;Home environment access/layout;Wound Care;Lack of/limited family support OT Patient demonstrates impairments in the following area(s): Balance;Cognition;Endurance;Pain;Safety;Skin Integrity OT Basic ADL's Functional Problem(s): Bathing;Dressing;Toileting;Grooming;Eating OT Transfers Functional Problem(s):  Toilet;Tub/Shower OT Plan OT Intensity: Minimum of 1-2 x/day, 45 to 90 minutes OT Frequency: 5 out of 7 days OT Duration/Estimated Length of Stay: 10-12 days OT Treatment/Interventions: Disease mangement/prevention;Balance/vestibular training;Cognitive remediation/compensation;Community reintegration;Discharge planning;DME/adaptive equipment instruction;Functional electrical stimulation;Functional mobility training;Neuromuscular re-education;Pain management;Patient/family education;Psychosocial support;Self Care/advanced ADL retraining;Skin care/wound managment;Splinting/orthotics;Therapeutic Activities;Therapeutic Exercise;UE/LE Strength taining/ROM;UE/LE Coordination activities;Visual/perceptual remediation/compensation OT Self Feeding Anticipated Outcome(s): Supervision OT Basic Self-Care Anticipated Outcome(s): Supervision OT Toileting Anticipated Outcome(s): Supervision OT Bathroom Transfers Anticipated Outcome(s): Supervision OT Recommendation Patient destination: Home Follow Up Recommendations: Home health OT Equipment Recommended: To be determined   OT Evaluation Precautions/Restrictions  Precautions Precautions: Fall;Other (comment) Recall of Precautions/Restrictions: Impaired Precaution/Restrictions Comments: L crani, Seizure, HOH, hx of dementia, orthostatic Restrictions Weight Bearing Restrictions Per Provider Order: No General Chart Reviewed: Yes Family/Caregiver Present: No Vital Signs Therapy Vitals Temp: 98.9 F (37.2 C) Temp Source: Oral Pulse Rate: 75 Resp: 17 BP: 118/62 Patient Position (if appropriate): Orthostatic Vitals Oxygen Therapy SpO2: 99 % O2 Device: Room Air Pain Pain Assessment Pain Scale: 0-10 Pain Score:  (Generalized) Pain Type: Acute pain Pain Intervention(s): Rest;Repositioned;Hot/Cold interventions Home Living/Prior Functioning Home Living Family/patient expects to be discharged to:: Private residence Living Arrangements:  Spouse/significant other Available Help at Discharge: Family Type of Home: House Home Access: Stairs to enter Secretary/administrator of Steps: Side entrace has 3 STE Home Layout: Two level, 1/2 bath on main level, Able to live on main level with bedroom/bathroom Bathroom Shower/Tub: Walk-in shower (Grab bars) Bathroom Toilet: Handicapped height Additional Comments: Information collected from chart review,  attempted to verify with patient, but preseting as questionable historian.  Lives With: Spouse Prior Function Level of Independence: Needs assistance with ADLs, Needs assistance with homemaking, Needs assistance with tranfers Vision Baseline Vision/History: 1 Wears glasses Ability to See in Adequate Light: 0 Adequate Patient Visual Report: No change from baseline Vision Assessment?: Vision impaired- to be further tested in functional context Perception  Perception: Within Functional Limits Praxis Praxis: WFL Cognition Cognition Overall Cognitive Status: No family/caregiver present to determine baseline cognitive functioning Arousal/Alertness: Lethargic Orientation Level: Person;Place;Situation Memory: Impaired Memory Impairment: Decreased recall of new information Awareness: Impaired Awareness Impairment: Intellectual impairment Safety/Judgment: Impaired Brief Interview for Mental Status (BIMS) Repetition of Three Words (First Attempt): 3 Temporal Orientation: Year: Correct Temporal Orientation: Month: Accurate within 5 days Temporal Orientation: Day: Incorrect Recall: "Sock": No, could not recall Recall: "Blue": No, could not recall Recall: "Bed": No, could not recall BIMS Summary Score: 8 Sensation Sensation Light Touch: Appears Intact Hot/Cold: Appears Intact Coordination Gross Motor Movements are Fluid and Coordinated: No Fine Motor Movements are Fluid and Coordinated: No Coordination and Movement Description: Deficits due generalized deconditioning/debility;  mildly dysmetric Finger Nose Finger Test: Mildly dysmetric Motor  Motor Motor: Abnormal postural alignment and control Motor - Skilled Clinical Observations: Forward flexion in standing; R lateral lean in sitting.  Trunk/Postural Assessment  Cervical Assessment Cervical Assessment: Exceptions to [facility] (Forward head) Thoracic Assessment Thoracic Assessment: Exceptions to [facility] (Rounded shoulders) Lumbar Assessment Lumbar Assessment: Exceptions to [facility] (Posterior pelvic tilt) Postural Control Postural Control: Deficits on evaluation Trunk Control: Decreased Righting Reactions: Decreased/delayed Protective Responses: Decreased/delayed  Balance Balance Balance Assessed: Yes Static Sitting Balance Static Sitting - Balance Support: Feet supported Static Sitting - Level of Assistance: 5: Stand by assistance (SUP) Dynamic Sitting Balance Dynamic Sitting - Balance Support: Feet unsupported;During functional activity Dynamic Sitting - Level of Assistance: 5: Stand by assistance;4: Min assist (CGA-Min A) Dynamic Sitting - Balance Activities: Lateral lean/weight shifting;Forward lean/weight shifting;Reaching for objects Static Standing Balance Static Standing - Balance Support: During functional activity;No upper extremity supported Static Standing - Level of Assistance: 4: Min assist Dynamic Standing Balance Dynamic Standing - Balance Support: No upper extremity supported;During functional activity Dynamic Standing - Level of Assistance: 4: Min assist;3: Mod assist Dynamic Standing - Balance Activities: Reaching for objects Extremity/Trunk Assessment RUE Assessment RUE Assessment: Exceptions to [facility] Active Range of Motion (AROM) Comments: Limted shoulder flexion General Strength Comments: 3+/5 LUE Assessment LUE Assessment: Exceptions to [facility] Active Range of Motion (AROM) Comments: Limited shoulder flexion General Strength Comments: 3-/5 grossly  Care Tool Care Tool Self Care Eating    Eating Assist Level: Minimal Assistance - Patient > 75%    Oral Care    Oral Care Assist Level: Supervision/Verbal cueing    Bathing   Body parts bathed by patient: Right arm;Left arm;Chest;Abdomen;Right upper leg;Left upper leg;Front perineal area;Face Body parts bathed by helper: Buttocks;Right lower leg;Left lower leg   Assist Level: Minimal Assistance - Patient > 75%    Upper Body Dressing(including orthotics)   What is the patient wearing?: Pull over shirt   Assist Level: Minimal Assistance - Patient > 75%    Lower Body Dressing (excluding footwear)   What is the patient wearing?: Pants Assist for lower body dressing: Moderate Assistance - Patient 50 - 74%    Putting on/Taking off footwear   What is the patient wearing?: Socks Assist for footwear: Maximal Assistance - Patient 25 - 49%       Care Tool Toileting Toileting activity  Assist for toileting: Moderate Assistance - Patient 50 - 74%     Care Tool Bed Mobility Roll left and right activity        Sit to lying activity        Lying to sitting on side of bed activity         Care Tool Transfers Sit to stand transfer        Chair/bed transfer         Toilet transfer   Assist Level: Minimal Assistance - Patient > 75%     Care Tool Cognition  Expression of Ideas and Wants Expression of Ideas and Wants: 3. Some difficulty - exhibits some difficulty with expressing needs and ideas (e.g, some words or finishing thoughts) or speech is not clear  Understanding Verbal and Non-Verbal Content Understanding Verbal and Non-Verbal Content: 3. Usually understands - understands most conversations, but misses some part/intent of message. Requires cues at times to understand   Memory/Recall Ability Memory/Recall Ability : That he or she is in a hospital/hospital unit   Refer to Care Plan for Long Term Goals  SHORT TERM GOAL WEEK 1 OT Short Term Goal 1 (Week 1): Pt will thread LB garments with Min A + LRAD. OT  Short Term Goal 2 (Week 1): Pt will complete 1/3 toileting activities with CGA + LRAD. OT Short Term Goal 3 (Week 1): Pt will perform toilet transfer with CGA + LRAD.  Recommendations for other services: None    Skilled Therapeutic Intervention  Session began with introduction to OT role, OT POC, and general orientation to rehab unit/schedule. Pt completes full-body dressing with levels of assistance noted below. Of note, patient does require increased levels of encouragement to participate in session, at times displaying self-limiting behaviors. OT assessing orthostatic vitals throughout functional activities, noted above. Pt remained sitting in recliner with posey belt activated.   ADL ADL Eating: Set up;Minimal assistance Where Assessed-Eating: Chair Grooming: Setup;Minimal assistance Where Assessed-Grooming: Chair Upper Body Bathing: Setup;Minimal assistance Where Assessed-Upper Body Bathing: Edge of bed Lower Body Bathing: Minimal assistance;Moderate assistance Where Assessed-Lower Body Bathing: Edge of bed Upper Body Dressing: Minimal assistance Where Assessed-Upper Body Dressing: Edge of bed Lower Body Dressing: Moderate assistance Where Assessed-Lower Body Dressing: Edge of bed Toileting: Moderate assistance;Maximal assistance Where Assessed-Toileting: Toilet;Bedside Commode Toilet Transfer: Minimal assistance Toilet Transfer Method: Stand pivot Toilet Transfer Equipment: Bedside commode Tub/Shower Transfer: Unable to assess Film/video editor: Unable to assess Mobility  Transfers Sit to Stand: Minimal Assistance - Patient > 75% Stand to Sit: Minimal Assistance - Patient > 75%   Discharge Criteria: Patient will be discharged from OT if patient refuses treatment 3 consecutive times without medical reason, if treatment goals not met, if there is a change in medical status, if patient makes no progress towards goals or if patient is discharged from hospital.  The above  assessment, treatment plan, treatment alternatives and goals were discussed and mutually agreed upon: by patient  Artemus Biles, OTR/L, MSOT  10/11/2023, 9:07 AM

## 2023-10-11 NOTE — Evaluation (Signed)
 Physical Therapy Assessment and Plan  Patient Details  Name: Jesse [name] MRN: [id number] Date of Birth: January 15, 1941  PT Diagnosis: Cognitive deficits, Difficulty walking, and Muscle weakness Rehab Potential: Good ELOS: 10-14 Days   Today's Date: 10/11/2023 PT Individual Time: 1410-1505 PT Individual Time Calculation (min): 55 min    Hospital Problem: Principal Problem:   SDH (subdural hematoma) (HCC) Active Problems:   Status post resection of meningioma   Past Medical History:  Past Medical History:  Diagnosis Date   Arthritis    Atrial fibrillation (HCC)    Complication of anesthesia    Atrial fibrillation during surgery    Dementia (HCC)    Depression    Hard of hearing    Hx of constipation    Osteoporosis    DEXA 11/09/10. Completed treatemtn with Fosamax.   Prostate cancer (HCC)    Restless legs    Thyroid  disease    hypothyroidism   Vitiligo    Past Surgical History:  Past Surgical History:  Procedure Laterality Date   BACK SURGERY     COLONOSCOPY     CRANIOTOMY Left 10/03/2023   Procedure: CRANIOTOMY TUMOR EXCISION LEFT FRONTAL;  Surgeon: Elna Haggis, MD;  Location: [facility] OR;  Service: Neurosurgery;  Laterality: Left;  Left Frontal Craniotomy for Menigioma   CRANIOTOMY N/A 10/04/2023   Procedure: CRANIOTOMY HEMATOMA EVACUATION SUBDURAL;  Surgeon: Elna Haggis, MD;  Location: [facility] OR;  Service: Neurosurgery;  Laterality: N/A;  REPEAT   FLEXIBLE SIGMOIDOSCOPY N/A 09/27/2017   Procedure: FLEXIBLE SIGMOIDOSCOPY;  Surgeon: Kenney Peacemaker, MD;  Location: [facility] ENDOSCOPY;  Service: Endoscopy;  Laterality: N/A;   JOINT REPLACEMENT     LAMINECTOMY  10/2011   cervical and lumbar laminectomy   PROSTATE BIOPSY  07/10/2011   PROSTATE BIOPSY  09/22/15   TOTAL HIP ARTHROPLASTY Right 02/29/2012   TOTAL HIP ARTHROPLASTY Right 07/04/2016   Procedure: RIGHT TOTAL HIP ARTHROPLASTY ANTERIOR APPROACH;  Surgeon: Adonica Hoose, MD;  Location: [facility] OR;  Service: Orthopedics;  Laterality:  Right;   TRANSURETHRAL RESECTION OF PROSTATE      Assessment & Plan Clinical Impression: Patient is a 83 year old R handed male with history of A fib, RLS, dementia, Prostate CA, meningioma dx 2016 (asymptomatic and had refused excision) who was [facility] to [facility] on 09/30/23 with one week history of progressive weakness with cognitive change and unwitnessed fall. He was found to have large heterogenous and hemorrhagic extra axial mass along left frontal convexity with 1.5 cm rightward midline shift and 7 mm subdural hemorrhage felt to be acute on chronic. MRA brain showed patent intracranial arterial vasculature with mild mass effect on A4 and A5 braches of ACA secondary to large mass and MRI showed 6.1 cm extra-axial mass most likely aggressive meningioma as well as L>R SDH.  Dr. Ellery Guthrie consulted and patient agreeable to undergo surgical intervention. Palliative care consulted and he elected on full scope of care. He underwent  crani for resection of left frontal brain tumor and for evacuation of SDH on 10/03/23. Post op course complicated by mental status changes with decrease in verbal out put and was found to have accumulation of bleed with mass effect.  He received 2 units FFP and was taken back to OR on 05/09 for revision craniotomy and evacuation of SDH. Post op decadron  taper and Keppra  for seizure prophylaxis.   Hospital course significant for urinary retention requiring I/O caths on 05/10 therefore foley placed. Left sided weakness has resolved with improvement in mentation and  verbal output. He was noted to have mild right sided weakness by Dr. Ellery Guthrie on 05/12. Repeat CT head done revealing post op evolution on left with no progression in hemorrhage, slight progression of parenchymal edema, residual SDH left cerebral convexity with partial effacement of left lateral ventricle, decrease in rightward midline shift and no other significant changes. Mild hyponatremia being monitored. Post op has had  issues with constipation. He did have vasovagal episode yesterday after large BM while in [place] with his wife and was altered, dysarthric and aphasic. SBP noted to be in 80's and was bolused with IVF. CT/CTA  head negative for LVO and revealed severe stenosis of M2 superior division of R-MCA and focal severe stenosis at origin of non dominant L-VA on aortic arch. EEG showed left cortical dysfunction without seizures. Dr. Bonnita Buttner questioned vasovagal syncope v/s breakthrough seizure left hemisphere causing post ictal aphasia and recommended increasing Keppra  to 750 mg BID and avoid hypotension.    Wife feels Flomax is causing pt to have not only orthostasis, but could have made him more confused- she does not want to have this medicine per nursing.    Pt also reports he refuses to eat "until my wife comes to feed me"- declined staff "feeding him".  Denies feeling constipated, however abd distended/firm- pt still says LBM yesterday and no issues.   Patient transferred to CIR on 10/10/2023 .   Patient currently requires min with mobility secondary to muscle weakness, decreased cardiorespiratoy endurance, decreased coordination and decreased motor planning, decreased initiation, decreased attention, decreased awareness, decreased problem solving, decreased safety awareness, decreased memory, delayed processing, and demonstrates behaviors consistent with [place], and decreased sitting balance, decreased standing balance, decreased postural control, and decreased balance strategies.  Prior to hospitalization, patient was independent  with mobility and lived with Spouse in a House home.  Home access is Side entrace has 3 STEStairs to enter.  Patient will benefit from skilled PT intervention to maximize safe functional mobility, minimize fall risk, and decrease caregiver burden for planned discharge home with 24 hour supervision.  Anticipate patient will benefit from follow up OP at discharge.  PT - End of  Session Activity Tolerance: Tolerates 30+ min activity with multiple rests Endurance Deficit: Yes PT Assessment Rehab Potential (ACUTE/IP ONLY): Good PT Patient demonstrates impairments in the following area(s): Balance;Behavior;Endurance;Motor;Pain;Perception;Safety;Sensory PT Transfers Functional Problem(s): Bed Mobility;Bed to Chair;Car;Furniture PT Locomotion Functional Problem(s): Ambulation;Stairs PT Plan PT Intensity: Minimum of 1-2 x/day ,45 to 90 minutes PT Frequency: 5 out of 7 days PT Duration Estimated Length of Stay: 10-14 Days PT Treatment/Interventions: Ambulation/gait training;Community reintegration;DME/adaptive equipment instruction;Neuromuscular re-education;Psychosocial support;Stair training;UE/LE Strength taining/ROM;Balance/vestibular training;Discharge planning;Functional electrical stimulation;Pain management;Therapeutic Activities;Skin care/wound management;UE/LE Coordination activities;Cognitive remediation/compensation;Disease management/prevention;Functional mobility training;Patient/family education;Splinting/orthotics;Therapeutic Exercise;Visual/perceptual remediation/compensation;Wheelchair propulsion/positioning PT Transfers Anticipated Outcome(s): Supervision PT Locomotion Anticipated Outcome(s): Supervision PT Recommendation Recommendations for Other Services: Neuropsych consult;Therapeutic Recreation consult Therapeutic Recreation Interventions: Pet therapy Follow Up Recommendations: 24 hour supervision/assistance;Outpatient PT Patient destination: Home Equipment Recommended: To be determined   PT Evaluation Precautions/Restrictions Precautions Precautions: Fall;Other (comment) Recall of Precautions/Restrictions: Impaired Precaution/Restrictions Comments: L crani, Seizure, HOH, hx of dementia, orthostatic Restrictions Weight Bearing Restrictions Per Provider Order: No General Chart Reviewed: Yes Family/Caregiver Present: Yes Pain  Interference Pain Interference Pain Effect on Sleep: 1. Rarely or not at all Pain Interference with Therapy Activities: 2. Occasionally Pain Interference with Day-to-Day Activities: 3. Frequently Home Living/Prior Functioning Home Living Available Help at Discharge: Family Type of Home: House Home Access: Stairs to enter [employer] of  Steps: Side entrace has 3 STE Home Layout: Two level;1/2 bath on main level;Able to live on main level with bedroom/bathroom Bathroom Shower/Tub: Walk-in shower (Grab bars) Bathroom Toilet: Handicapped height Additional Comments: Information collected from chart review, attempted to verify with patient, but preseting as questionable historian.  Lives With: Spouse Prior Function Level of Independence: Needs assistance with ADLs;Needs assistance with homemaking;Needs assistance with tranfers Vision/Perception  Vision - History Ability to See in Adequate Light: 0 Adequate Perception Perception: Within Functional Limits Praxis Praxis: WFL  Cognition Overall Cognitive Status: Impaired/Different from baseline Arousal/Alertness: Lethargic Orientation Level: Oriented X4 Year: 2025 Month: June Day of Week: Correct Attention: Sustained Sustained Attention: Impaired (likely due to fatigue) Sustained Attention Impairment: Verbal basic;Functional complex Memory: Impaired Memory Impairment: Decreased short term memory Decreased Short Term Memory: Verbal basic;Functional basic Awareness: Impaired Awareness Impairment: Intellectual impairment Problem Solving: Impaired Problem Solving Impairment: Verbal basic;Functional basic Sensation Sensation Light Touch: Appears Intact Hot/Cold: Appears Intact Coordination Gross Motor Movements are Fluid and Coordinated: No Fine Motor Movements are Fluid and Coordinated: No Coordination and Movement Description: Deficits due generalized deconditioning/debility; mildly dysmetric Finger Nose Finger Test:  Mildly dysmetric Motor  Motor Motor: Abnormal postural alignment and control Motor - Skilled Clinical Observations: Forward flexion in standing; R lateral lean in sitting.  Trunk/Postural Assessment  Cervical Assessment Cervical Assessment: Exceptions to [facility] (Forward head) Thoracic Assessment Thoracic Assessment: Exceptions to [facility] (Rounded shoulders) Lumbar Assessment Lumbar Assessment: Exceptions to [facility] (Posterior pelvic tilt) Postural Control Postural Control: Deficits on evaluation Trunk Control: Decreased Righting Reactions: Decreased/delayed Protective Responses: Decreased/delayed  Balance Balance Balance Assessed: Yes Static Sitting Balance Static Sitting - Balance Support: Feet supported Static Sitting - Level of Assistance: 5: Stand by assistance (SUP) Dynamic Sitting Balance Dynamic Sitting - Balance Support: Feet unsupported;During functional activity Dynamic Sitting - Level of Assistance: 5: Stand by assistance;4: Min assist (CGA-Min A) Dynamic Sitting - Balance Activities: Lateral lean/weight shifting;Forward lean/weight shifting;Reaching for objects Static Standing Balance Static Standing - Balance Support: During functional activity;No upper extremity supported Static Standing - Level of Assistance: 4: Min assist Dynamic Standing Balance Dynamic Standing - Balance Support: No upper extremity supported;During functional activity Dynamic Standing - Level of Assistance: 4: Min assist;3: Mod assist Dynamic Standing - Balance Activities: Reaching for objects Extremity Assessment  RLE Assessment RLE Assessment: Exceptions to [facility] General Strength Comments: Grossly 4/5 LLE Assessment LLE Assessment: Exceptions to [facility] General Strength Comments: Grossly 4/5  Care Tool Care Tool Bed Mobility Roll left and right activity   Roll left and right assist level: Minimal Assistance - Patient > 75%    Sit to lying activity   Sit to lying assist level: Minimal Assistance -  Patient > 75%    Lying to sitting on side of bed activity   Lying to sitting on side of bed assist level: the ability to move from lying on the back to sitting on the side of the bed with no back support.: Minimal Assistance - Patient > 75%     Care Tool Transfers Sit to stand transfer        Chair/bed transfer   Chair/bed transfer assist level: Minimal Assistance - Patient > 75%    Car transfer   Car transfer assist level: Minimal Assistance - Patient > 75%      Care Tool Locomotion Ambulation   Assist level: 2 helpers Assistive device: Hand held assist Max distance: 30'  Walk 10 feet activity   Assist level: 2 helpers Assistive device: Hand held  assist   Walk 50 feet with 2 turns activity Walk 50 feet with 2 turns activity did not occur: Safety/medical concerns      Walk 150 feet activity Walk 150 feet activity did not occur: Safety/medical concerns      Walk 10 feet on uneven surfaces activity Walk 10 feet on uneven surfaces activity did not occur: Safety/medical concerns      Stairs   Assist level: Minimal Assistance - Patient > 75% Stairs assistive device: 2 hand rails Max number of stairs: 4  Walk up/down 1 step activity   Walk up/down 1 step (curb) assist level: Minimal Assistance - Patient > 75% Walk up/down 1 step or curb assistive device: 2 hand rails  Walk up/down 4 steps activity   Walk up/down 4 steps assist level: Minimal Assistance - Patient > 75% Walk up/down 4 steps assistive device: 2 hand rails  Walk up/down 12 steps activity Walk up/down 12 steps activity did not occur: Safety/medical concerns      Pick up small objects from floor   Pick up small object from the floor assist level: Total Assistance - Patient < 25%    Wheelchair Is the patient using a wheelchair?: Yes Type of Wheelchair: Manual   Wheelchair assist level: Dependent - Patient 0% Max wheelchair distance: 150'  Wheel 50 feet with 2 turns activity   Assist Level: Dependent -  Patient 0%  Wheel 150 feet activity   Assist Level: Dependent - Patient 0%    Refer to Care Plan for Long Term Goals  SHORT TERM GOAL WEEK 1 PT Short Term Goal 1 (Week 1): Pt will complete bed mobility with supervision PT Short Term Goal 2 (Week 1): Pt will complete bed to chair transfer with CGA PT Short Term Goal 3 (Week 1): Pt will ambulate x50' with minA and LRAD. PT Short Term Goal 4 (Week 1): Pt will complete balance assessment  Recommendations for other services: Neuropsych and Therapeutic Recreation  Pet therapy  Skilled Therapeutic Intervention  Evaluation completed (see details above and below) with education on PT POC and goals and individual treatment initiated with focus on bed mobility, balance, transfers, ambulation, and stair training. Pt received supine in bed asleep. Reports pain all over but unable to provides specifics. PT provides rest breaks as needed to manage pain. Pt performs supine to sit with minA and cues for sequencing and positioning at EOB. Pt performs stand pivot transfer to [facility] with minA and cues for posture and positioning. WC transport to gym. Pt stands with minA and ambulates x30' with minA/modA HHA at RUE, and +2 WC follow for safety. Pt has shuffling gait pattern and forward flexed posture, and PT provides cues to correct, with minimal improved noted. Following extended seated rest break, pt completes x4 6" steps with bilateral hand rails and cues for step sequencing and safety. WC transport back to room. Stand step to bed with minA and cues for sequencing and positioning. MinA for sit to supine with cues for optimal positioning for comfort. Left supine with all needs within reach.   Mobility Transfers Transfers: Sit to Stand;Stand to Sit;Stand Pivot Transfers Sit to Stand: Minimal Assistance - Patient > 75% Stand to Sit: Minimal Assistance - Patient > 75% Stand Pivot Transfers: Minimal Assistance - Patient > 75% Stand Pivot Transfer Details: Verbal cues  for sequencing;Verbal cues for technique;Verbal cues for precautions/safety;Tactile cues for posture;Tactile cues for sequencing;Tactile cues for initiation Transfer (Assistive device): Standard walker Locomotion  Gait Ambulation: Yes Gait  Assistance: 2 Helpers Educational psychologist (Feet): 30 Feet Assistive device: 1 person hand held assist Gait Assistance Details: Verbal cues for sequencing;Verbal cues for technique;Verbal cues for precautions/safety;Tactile cues for sequencing;Tactile cues for posture Gait Gait: Yes Gait Pattern: Impaired Gait Pattern: Shuffle Gait velocity: decreased Stairs / Additional Locomotion Stairs: Yes Stairs Assistance: Minimal Assistance - Patient > 75% Stair Management Technique: Two rails Number of Stairs: 4 Height of Stairs: 6 Curb: Minimal Assistance - Patient >75% Wheelchair Mobility Wheelchair Mobility: Yes Wheelchair Assistance: Dependent - Patient 0% Distance: 150'   Discharge Criteria: Patient will be discharged from PT if patient refuses treatment 3 consecutive times without medical reason, if treatment goals not met, if there is a change in medical status, if patient makes no progress towards goals or if patient is discharged from hospital.  The above assessment, treatment plan, treatment alternatives and goals were discussed and mutually agreed upon: by patient  Neva Barban, PT, DPT 10/11/2023, 4:11 PM

## 2023-10-11 NOTE — Plan of Care (Signed)
  Problem: RH BOWEL ELIMINATION Goal: RH STG MANAGE BOWEL WITH ASSISTANCE Description: STG Manage Bowel with supervision Assistance. Outcome: Progressing   Problem: RH BLADDER ELIMINATION Goal: RH STG MANAGE BLADDER WITH ASSISTANCE Description: STG Manage Bladder With supervision Assistance Outcome: Progressing   Problem: RH SKIN INTEGRITY Goal: RH STG SKIN FREE OF INFECTION/BREAKDOWN Description: Manage skin free of infection/breakdown with supervision Outcome: Progressing   Problem: RH SAFETY Goal: RH STG ADHERE TO SAFETY PRECAUTIONS W/ASSISTANCE/DEVICE Description: STG Adhere to Safety Precautions With  supervision Assistance/Device. Outcome: Progressing

## 2023-10-11 NOTE — Evaluation (Signed)
 Speech Language Pathology Assessment and Plan  Patient Details  Name: Jesse [name] MRN: [id number] Date of Birth: 11/27/1940  SLP Diagnosis: Cognitive Impairments  Rehab Potential: Good ELOS: 10-12 days    Today's Date: 10/11/2023 SLP Individual Time: 1300-1400 SLP Individual Time Calculation (min): 60 min   Hospital Problem: Principal Problem:   SDH (subdural hematoma) (HCC) Active Problems:   Status post resection of meningioma  Past Medical History:  Past Medical History:  Diagnosis Date   Arthritis    Atrial fibrillation (HCC)    Complication of anesthesia    Atrial fibrillation during surgery    Dementia (HCC)    Depression    Hard of hearing    Hx of constipation    Osteoporosis    DEXA 11/09/10. Completed treatemtn with Fosamax.   Prostate cancer (HCC)    Restless legs    Thyroid  disease    hypothyroidism   Vitiligo    Past Surgical History:  Past Surgical History:  Procedure Laterality Date   BACK SURGERY     COLONOSCOPY     CRANIOTOMY Left 10/03/2023   Procedure: CRANIOTOMY TUMOR EXCISION LEFT FRONTAL;  Surgeon: Elna Haggis, MD;  Location: [facility] OR;  Service: Neurosurgery;  Laterality: Left;  Left Frontal Craniotomy for Menigioma   CRANIOTOMY N/A 10/04/2023   Procedure: CRANIOTOMY HEMATOMA EVACUATION SUBDURAL;  Surgeon: Elna Haggis, MD;  Location: [facility] OR;  Service: Neurosurgery;  Laterality: N/A;  REPEAT   FLEXIBLE SIGMOIDOSCOPY N/A 09/27/2017   Procedure: FLEXIBLE SIGMOIDOSCOPY;  Surgeon: Kenney Peacemaker, MD;  Location: [facility] ENDOSCOPY;  Service: Endoscopy;  Laterality: N/A;   JOINT REPLACEMENT     LAMINECTOMY  10/2011   cervical and lumbar laminectomy   PROSTATE BIOPSY  07/10/2011   PROSTATE BIOPSY  09/22/15   TOTAL HIP ARTHROPLASTY Right 02/29/2012   TOTAL HIP ARTHROPLASTY Right 07/04/2016   Procedure: RIGHT TOTAL HIP ARTHROPLASTY ANTERIOR APPROACH;  Surgeon: Adonica Hoose, MD;  Location: [facility] OR;  Service: Orthopedics;  Laterality: Right;   TRANSURETHRAL  RESECTION OF PROSTATE      Assessment / Plan / Recommendation Clinical Impression HPI:  Pt is an 83 y/o male with PMH of dementia, atrial fibrillation, hypothyroidism, prostate cancer and meningioma for which he has previously refused resection.  He presented to [facility] on 09/30/23 after an unwitnessed fall at home.  Wife notes he was found down on top of his walker with blood on his shirt, and after fall legs seemed to not work.  In ED BP elevated to 170/108, sodium 128, chloride 95, CO2 21.  CT imaging of head and cspine revealed known heterogenous and hemorrhagic mass along the left frontal cortex with a 1.5 cm rightward shift which is significantly increased in size from previous imaging (2019) with 7mm edema within the collection suggestive of acute hemorrhage.  Neurosurgery was consulted.  Per Dr. Ruthann Cover discussion with the spouse pt has had an acute deterioration over the previous few weeks.  He recommended surgical resection of the tumor and pt was agreeable.  Pt underwent craniotomy with resection of meningioma and evacuation of SDH on 5/8 per Dr. Ellery Guthrie.  Post op course complicated by decreasing responsiveness and perseverative.  Stat CT showed accumulated acute subdural hematoma with increasing shift and mass effect.  He was taken back to the OR for revision of his crani with evacuation of new SDH on 5/9 per Dr. Ellery Guthrie.  Post op scans show expected post op evolution of L frontal mass with slight mass effect on the  left lateral ventricle. PA downgraded to full liquid diet from regular/thin due to "KUB showing adynamic ileus" on 5/15.  Clinical Impression:  Patient currently on full liquid diet due to adynamic ileus. Patient and wife report no difficulty with consumption of solids and liquids. Patient tolerating regular/thin diet prior to ileus. Recent WBC WFL. No ST needs regarding swallowing at this time.  Cognitive-Linguistic: Patient was evaluated via the Cognistat + informal measures to  assess cognitive-linguistic skills. Patient with moderate deficits in delayed recall and registration and mild deficits in calculations. Patient required modA during simple medication mistake identification task. These deficits are indicative of moderate impairments in STM and problem solving. Informally noted mild deficits in sustained attention to task (vs lethargy) and intellectual awareness of current medical situation. Of note, unable to truly assess patients knowledge of current situation as wife verbose regarding medical hx. Patient with functional receptive and expressive language and oriented x4 at the time of ST evaluation. Patients wife reports his mentation waxes/wanes with occasional episodes of "muteness" and delayed processing. Recommend targeting attention, intellectual awareness, memory and problem solving during inpatient rehabilitation stay.  Dysarthria: Patient 100% intelligible at the conversational level.  Pt would benefit from skilled ST services to maximize cognition in order to maximize functional independence at d/c. Anticipate patient will require 24 hour supervision at d/c and f/u SLP services.    Skilled Therapeutic Interventions          Patient evaluated using a standardized cognitive linguistic assessment and bedside swallow evaluation to assess current cognitive, communicative and swallowing function. See above for details.    SLP Assessment  Patient will need skilled Speech Lanaguage Pathology Services during CIR admission    Recommendations  SLP Diet Recommendations:  (currently on liquid diet due to GI issues) Supervision: Staff to assist with self feeding Compensations: Slow rate;Small sips/bites Postural Changes and/or Swallow Maneuvers: Seated upright 90 degrees Oral Care Recommendations: Oral care BID Patient destination: Home Follow up Recommendations: Home Health SLP;Outpatient SLP;24 hour supervision/assistance Equipment Recommended: None recommended by SLP     SLP Frequency 3 to 5 out of 7 days   SLP Duration  SLP Intensity  SLP Treatment/Interventions 10-12 days  Minumum of 1-2 x/day, 30 to 90 minutes  Cognitive remediation/compensation;Cueing hierarchy;Internal/external aids;Patient/family education;Therapeutic Activities    Pain None reported   SLP Evaluation Cognition Overall Cognitive Status: Impaired/Different from baseline Arousal/Alertness: Lethargic Orientation Level: Oriented X4 Year: 2025 Month: June Day of Week: Correct Attention: Sustained Sustained Attention: Impaired (likely due to fatigue) Sustained Attention Impairment: Verbal basic;Functional complex Memory: Impaired Memory Impairment: Decreased short term memory Decreased Short Term Memory: Verbal basic;Functional basic Awareness: Impaired Awareness Impairment: Intellectual impairment Problem Solving: Impaired Problem Solving Impairment: Verbal basic;Functional basic  Comprehension Auditory Comprehension Overall Auditory Comprehension: Appears within functional limits for tasks assessed Yes/No Questions: Within Functional Limits Commands: Within Functional Limits Expression Expression Primary Mode of Expression: Verbal Verbal Expression Overall Verbal Expression: Appears within functional limits for tasks assessed Repetition: No impairment Naming: No impairment Pragmatics: Impairment Impairments: Monotone Oral Motor Oral Motor/Sensory Function Overall Oral Motor/Sensory Function: Within functional limits Motor Speech Overall Motor Speech: Appears within functional limits for tasks assessed Respiration: Within functional limits Phonation: Normal Resonance: Within functional limits Articulation: Within functional limitis Intelligibility: Intelligible Motor Planning: Witnin functional limits  Care Tool Care Tool Cognition Ability to hear (with hearing aid or hearing appliances if normally used Ability to hear (with hearing aid or hearing  appliances if normally used): 1. Minimal difficulty - difficulty  in some environments (e.g. when person speaks softly or setting is noisy)   Expression of Ideas and Wants Expression of Ideas and Wants: 3. Some difficulty - exhibits some difficulty with expressing needs and ideas (e.g, some words or finishing thoughts) or speech is not clear   Understanding Verbal and Non-Verbal Content Understanding Verbal and Non-Verbal Content: 3. Usually understands - understands most conversations, but misses some part/intent of message. Requires cues at times to understand  Memory/Recall Ability Memory/Recall Ability : That he or she is in a hospital/hospital unit    Short Term Goals: Week 1: SLP Short Term Goal 1 (Week 1): Patient will demonstrate simple problem solving skills during functional tasks given mod multimodal A SLP Short Term Goal 2 (Week 1): Patient will demonstrate carryover of basic daily information given mod multimodal A SLP Short Term Goal 3 (Week 1): Patient will demonstrate sustained attention during functional tasks for upwards of 10 minutes given mod multimodal A SLP Short Term Goal 4 (Week 1): Patient will recall current medical situations and 2 functional deficits given mod multimodal A  Refer to Care Plan for Long Term Goals  Recommendations for other services: None   Discharge Criteria: Patient will be discharged from SLP if patient refuses treatment 3 consecutive times without medical reason, if treatment goals not met, if there is a change in medical status, if patient makes no progress towards goals or if patient is discharged from hospital.  The above assessment, treatment plan, treatment alternatives and goals were discussed and mutually agreed upon: by patient and by family  Jenea Dake M.A., CCC-SLP 10/11/2023, 3:41 PM

## 2023-10-11 NOTE — Progress Notes (Signed)
 Physical Therapy Note  Patient Details  Name: Jesse [name] MRN: [id number] Date of Birth: 07-02-1940 Today's Date: 10/11/2023  Pt refuses therapy at this time due to having just received an enema, per wife's request. PT will follow up as able to complete evaluation.     Neva Barban, PT, DPT 10/11/2023, 11:43 AM

## 2023-10-11 NOTE — Progress Notes (Signed)
 Inpatient Rehabilitation  Patient information reviewed and entered into eRehab system by Feliberto Gottron, M.A., CCC-SLP, Rehab Quality Coordinator.  Information including medical coding, functional ability and quality indicators will be reviewed and updated through discharge.

## 2023-10-11 NOTE — Plan of Care (Signed)
  Problem: RH Problem Solving Goal: LTG Patient will demonstrate problem solving for (SLP) Description: LTG:  Patient will demonstrate problem solving for basic/complex daily situations with cues  (SLP) Flowsheets (Taken 10/11/2023 1554) LTG: Patient will demonstrate problem solving for (SLP): Basic daily situations LTG Patient will demonstrate problem solving for: Minimal Assistance - Patient > 75%   Problem: RH Memory Goal: LTG Patient will use memory compensatory aids to (SLP) Description: LTG:  Patient will use memory compensatory aids to recall biographical/new, daily complex information with cues (SLP) Flowsheets (Taken 10/11/2023 1554) LTG: Patient will use memory compensatory aids to (SLP): Minimal Assistance - Patient > 75%   Problem: RH Attention Goal: LTG Patient will demonstrate this level of attention during functional activites (SLP) Description: LTG:  Patient will will demonstrate this level of attention during functional activites (SLP) Flowsheets (Taken 10/11/2023 1554) Patient will demonstrate during cognitive/linguistic activities the attention type of: Sustained LTG: Patient will demonstrate this level of attention during cognitive/linguistic activities with assistance of (SLP): Minimal Assistance - Patient > 75% Number of minutes patient will demonstrate attention during cognitive/linguistic activities: 10   Problem: RH Awareness Goal: LTG: Patient will demonstrate awareness during functional activites type of (SLP) Description: LTG: Patient will demonstrate awareness during functional activites type of (SLP) Flowsheets (Taken 10/11/2023 1554) Patient will demonstrate during cognitive/linguistic activities awareness type of: Intellectual LTG: Patient will demonstrate awareness during cognitive/linguistic activities with assistance of (SLP): Minimal Assistance - Patient > 75%

## 2023-10-11 NOTE — IPOC Note (Signed)
 Overall Plan of Care [facility]) Patient Details Name: Jesse [name] MRN: [id number] DOB: July 13, 1940  Admitting Diagnosis: SDH (subdural hematoma) [facility])  Hospital Problems: Principal Problem:   SDH (subdural hematoma) (HCC) Active Problems:   Status post resection of meningioma     Functional Problem List: Nursing Bladder, Bowel, Edema, Endurance, Medication Management, Pain, Safety, Skin Integrity  PT Balance, Behavior, Endurance, Motor, Pain, Perception, Safety, Sensory  OT Balance, Cognition, Endurance, Pain, Safety, Skin Integrity  SLP Cognition  TR         Basic ADL's: OT Bathing, Dressing, Toileting, Grooming, Eating     Advanced  ADL's: OT       Transfers: PT Bed Mobility, Bed to Chair, Car, Occupational psychologist, Research scientist (life sciences): PT Ambulation, Stairs     Additional Impairments: OT    SLP Social Cognition   Social Interaction, Memory, Problem Solving, Attention, Awareness  TR      Anticipated Outcomes Item Anticipated Outcome  Self Feeding Supervision  Swallowing      Basic self-care  Supervision  Toileting  Supervision   Bathroom Transfers Supervision  Bowel/Bladder  manage bowels with medications/ manage bladder with voidig trials/time toileting  Transfers  Supervision  Locomotion  Supervision  Communication     Cognition  minA  Pain  manage pain<4 w/ prns  Safety/Judgment  manage safety with supervision assistance   Therapy Plan: PT Intensity: Minimum of 1-2 x/day ,45 to 90 minutes PT Frequency: 5 out of 7 days PT Duration Estimated Length of Stay: 10-14 Days OT Intensity: Minimum of 1-2 x/day, 45 to 90 minutes OT Frequency: 5 out of 7 days OT Duration/Estimated Length of Stay: 10-12 days SLP Intensity: Minumum of 1-2 x/day, 30 to 90 minutes SLP Frequency: 3 to 5 out of 7 days SLP Duration/Estimated Length of Stay: 10-12 days   Team Interventions: Nursing Interventions Bladder Management, Bowel Management, Disease  Management/Prevention, Pain Management, Medication Management, Skin Care/Wound Management, Discharge Planning  PT interventions Ambulation/gait training, Community reintegration, DME/adaptive equipment instruction, Neuromuscular re-education, Psychosocial support, Stair training, UE/LE Strength taining/ROM, Warden/ranger, Discharge planning, Functional electrical stimulation, Pain management, Therapeutic Activities, Skin care/wound management, UE/LE Coordination activities, Cognitive remediation/compensation, Disease management/prevention, Functional mobility training, Patient/family education, Splinting/orthotics, Therapeutic Exercise, Visual/perceptual remediation/compensation, Wheelchair propulsion/positioning  OT Interventions Disease mangement/prevention, Warden/ranger, Cognitive remediation/compensation, Community reintegration, Discharge planning, DME/adaptive equipment instruction, Functional electrical stimulation, Functional mobility training, Neuromuscular re-education, Pain management, Patient/family education, Psychosocial support, Self Care/advanced ADL retraining, Skin care/wound managment, Splinting/orthotics, Therapeutic Activities, Therapeutic Exercise, UE/LE Strength taining/ROM, UE/LE Coordination activities, Visual/perceptual remediation/compensation  SLP Interventions Cognitive remediation/compensation, Cueing hierarchy, Internal/external aids, Patient/family education, Therapeutic Activities  TR Interventions    SW/CM Interventions Discharge Planning, Psychosocial Support, Patient/Family Education   Barriers to Discharge MD  Medical stability  Nursing Decreased caregiver support, Home environment access/layout, Neurogenic Bowel & Bladder Discharge: House  Discharge Home Layout: One level, Laundry or work area in basement  Discharge Home Access: Stairs to enter  Entrance Stairs-Rails: None  Secretary/administrator of Steps: 2-3  PT      OT Decreased  caregiver support, Home environment access/layout, Wound Care, Lack of/limited family support    SLP      SW Decreased caregiver support, Lack of/limited family support     Team Discharge Planning: Destination: PT-Home ,OT- Home , SLP-Home Projected Follow-up: PT-24 hour supervision/assistance, Outpatient PT, OT-  Home health OT, SLP-Home Health SLP, Outpatient SLP, 24 hour supervision/assistance Projected Equipment Needs: PT-To be determined, OT- To be  determined, SLP-None recommended by SLP Equipment Details: PT- , OT-  Patient/family involved in discharge planning: PT- Patient, Family member/caregiver,  OT-Patient, SLP-Patient, Family member/caregiver  MD ELOS: 10-12 days Medical Rehab Prognosis:  Excellent Assessment: The patient has been admitted for CIR therapies with the diagnosis of Left SDH after fall, prior mengioma leading to seizure/fall. The team will be addressing functional mobility, strength, stamina, balance, safety, adaptive techniques and equipment, self-care, bowel and bladder mgt, patient and caregiver education, cognition, behavior, communication, community reentry. Goals have been set at supervision for mobility, self-care, and cognition. Anticipated discharge destination is home with wife. .        See Team Conference Notes for weekly updates to the plan of care

## 2023-10-11 NOTE — Progress Notes (Addendum)
 PROGRESS NOTE   Subjective/Complaints: Event of night noted. Pt states he didn't have a great night. I asked a few basic questions and he typically responded that his wife needed to come in. No gas or bm reported overnight. Had a medium mushy bm at 1130 yesterday .   ROS: Limited due to cognitive/behavioral    Objective:   DG Abd 1 View Result Date: 10/10/2023 CLINICAL DATA:  Abdominal distension EXAM: ABDOMEN - 1 VIEW COMPARISON:  09/30/2017 FINDINGS: Two supine frontal views of the abdomen and pelvis are obtained. There is marked diffuse gaseous distention of the colon most compatible with adynamic ileus. No radiographic findings to suggest volvulus. No evidence of bowel obstruction. No masses or abnormal calcifications. No acute bony abnormalities. IMPRESSION: 1. Marked diffuse gaseous distention of the colon, most consistent with adynamic ileus. No radiographic evidence of recurrent sigmoid volvulus. Electronically Signed   By: Bobbye Burrow M.D.   On: 10/10/2023 19:48   MR BRAIN W WO CONTRAST Result Date: 10/09/2023 CLINICAL DATA:  Acute neurologic deficit EXAM: MRI HEAD WITHOUT AND WITH CONTRAST TECHNIQUE: Multiplanar, multiecho pulse sequences of the brain and surrounding structures were obtained without and with intravenous contrast. CONTRAST:  7.5mL GADAVIST  GADOBUTROL  1 MMOL/ML IV SOLN COMPARISON:  09/30/2023 FINDINGS: Brain: Status post resection of anterior left convexity meningioma. There are blood products throughout the resection cavity. No residual contrast enhancing mass. Left convexity subdural hematoma measures 7 mm in thickness. 2 mm posterior right convexity subdural hematoma. There is moderate edema within the anterior left frontal white matter. Generalized volume loss. There is rightward midline shift of approximately 9 mm, improved. Vascular: Normal flow voids. Skull and upper cervical spine: Normal calvarium and skull  base. Visualized upper cervical spine and soft tissues are normal. Sinuses/Orbits:No paranasal sinus fluid levels or advanced mucosal thickening. No mastoid or middle ear effusion. Normal orbits. IMPRESSION: 1. Status post resection of anterior left convexity meningioma. No residual contrast enhancing mass. 2. Left convexity subdural hematoma measures 7 mm in thickness. 2 mm posterior right convexity subdural hematoma. 3. Rightward midline shift of approximately 9 mm, improved. Electronically Signed   By: Juanetta Nordmann M.D.   On: 10/09/2023 22:35   EEG adult Result Date: 10/09/2023 Arleene Lack, MD     10/09/2023  5:42 PM Patient Name: DURELLE HOLMS MRN: [id number] Epilepsy Attending: Arleene Lack Referring Physician/Provider: Imogene Mana, NP Date: 10/09/2023 Duration: 22.16 mins Patient history: 82yo m with ams. EEG to evaluate for seizure Level of alertness: Awake AEDs during EEG study: LEV Technical aspects: This EEG study was done with scalp electrodes positioned according to the 10-20 International system of electrode placement. Electrical activity was reviewed with band pass filter of 1-70Hz , sensitivity of 7 uV/mm, display speed of 49mm/sec with a 60Hz  notched filter applied as appropriate. EEG data were recorded continuously and digitally stored.  Video monitoring was available and reviewed as appropriate. Description: The posterior dominant rhythm consists of 8 Hz activity of moderate voltage (25-35 uV) seen predominantly in posterior head regions, asymmetric ( left<right) and reactive to eye opening and eye closing. EEG showed continuous 3 to 6 Hz theta-delta slowing admixed with 12-14hz  beta activity  in left hemisphere. Hyperventilation and photic stimulation were not performed.   ABNORMALITY - Continuous slow, left hemisphere IMPRESSION: This study is suggestive of cortical dysfunction arising from left hemisphere likely secondary to underlying structural abnormality/ SDH. No seizures or  epileptiform discharges were seen throughout the recording. Priyanka Suzanne Erps   CT ANGIO HEAD NECK W WO CM (CODE STROKE) Result Date: 10/09/2023 CLINICAL DATA:  Neuro deficit, concern for stroke, slurred speech. EXAM: CT ANGIOGRAPHY HEAD AND NECK WITH AND WITHOUT CONTRAST TECHNIQUE: Multidetector CT imaging of the head and neck was performed using the standard protocol during bolus administration of intravenous contrast. Multiplanar CT image reconstructions and MIPs were obtained to evaluate the vascular anatomy. Carotid stenosis measurements (when applicable) are obtained utilizing NASCET criteria, using the distal internal carotid diameter as the denominator. RADIATION DOSE REDUCTION: This exam was performed according to the departmental dose-optimization program which includes automated exposure control, adjustment of the mA and/or kV according to patient size and/or use of iterative reconstruction technique. CONTRAST:  75mL OMNIPAQUE IOHEXOL 350 MG/ML SOLN COMPARISON:  Same day CT head.  MRA head 09/30/2023. FINDINGS: CTA NECK FINDINGS Aortic arch: Four vessel configuration of the aortic arch. Imaged portion shows no evidence of aneurysm or dissection. Mild atherosclerosis. No significant stenosis of the major arch vessel origins. Pulmonary arteries: As permitted by contrast timing, there are no filling defects in the visualized pulmonary arteries. Subclavian arteries: The subclavian arteries are patent bilaterally. Right carotid system: No evidence of dissection, stenosis (50% or greater), or occlusion. Bulky calcified atherosclerosis along the proximal cervical ICA without hemodynamically significant stenosis. Left carotid system: No evidence of dissection, stenosis (50% or greater), or occlusion. Mild atherosclerosis at the carotid bifurcation without stenosis. Vertebral arteries: Right vertebral artery is dominant. Atherosclerosis at the left vertebral artery origin. The vessel origin is somewhat obscured  due to artifact. There is likely severe stenosis at the origin of the left vertebral artery. The vertebral arteries are patent from the origins to the vertebrobasilar confluence. No evidence of dissection. Skeleton: Postsurgical changes of the calvarium. No acute or aggressive finding. Sequelae of laminectomy from C4-C6. Degenerative changes in the visualized spine. Other neck: The visualized airway is patent. No cervical lymphadenopathy. Upper chest: Visualized lung apices are clear. Review of the MIP images confirms the above findings CTA HEAD FINDINGS ANTERIOR CIRCULATION: The intracranial ICAs are patent bilaterally. Atherosclerosis of the bilateral carotid siphons. Mild stenosis of the left cavernous ICA. 1.5 mm prominence along the right supraclinoid ICA at the origin of the posterior communicating artery. No high-grade stenosis, proximal occlusion, or vascular malformation. MCAs: The right M1 segment is patent. Severe stenosis of an M2 superior vision branch of the right MCA. The left M1 segment is patent. Left MCA branches are patent. ACAs: Patent bilaterally. Accounting for differences in modality, there is slightly decreased mass effect on distal ACA branches per compared to prior MRA. POSTERIOR CIRCULATION: No significant stenosis, proximal occlusion, aneurysm, or vascular malformation. PCAs: Patent bilaterally. The left PCA appears primarily supplied by the posterior communicating artery with contribution from a small P1 segment noted. Pcomm: Visualized bilaterally, larger on the left. SCAs: The superior cerebellar arteries are patent bilaterally. Basilar artery: Patent AICAs: None visualized on the right. PICAs: Visualized bilaterally, larger on the left. Vertebral arteries: The intracranial vertebral arteries are patent. Venous sinuses: As permitted by contrast timing, patent. Anatomic variants: None Review of the MIP images confirms the above findings IMPRESSION: No large vessel occlusion. Severe  stenosis of an M2 superior  division branch of the right MCA. Accounting for differences in modality there is slightly decreased mass effect on the distal ACA branches compared to prior MRA. 1.5 mm outpouching along the right supraclinoid ICA at the origin of the posterior communicating artery which may reflect an infundibular origin versus less likely small aneurysm. Atherosclerosis in the neck as above without high-grade stenosis. Focal severe stenosis at the origin of the non dominant left vertebral artery on the aortic arch. Electronically Signed   By: Denny Flack M.D.   On: 10/09/2023 13:13   CT HEAD CODE STROKE WO CONTRAST Result Date: 10/09/2023 CLINICAL DATA:  Code stroke. Neuro deficit, concern for stroke. Slurred speech. EXAM: CT HEAD WITHOUT CONTRAST TECHNIQUE: Contiguous axial images were obtained from the base of the skull through the vertex without intravenous contrast. RADIATION DOSE REDUCTION: This exam was performed according to the departmental dose-optimization program which includes automated exposure control, adjustment of the mA and/or kV according to patient size and/or use of iterative reconstruction technique. COMPARISON:  CT head 10/07/2023 and earlier. FINDINGS: Brain: Left frontotemporal craniotomy. Postoperative changes from resection of right frontal lobe mass and evacuation of parenchymal hematoma. Scattered foci of residual parenchymal hemorrhage in the left frontal lobe are overall similar to prior. Minimally increased edema within the left frontal lobe. Redemonstrated mixed attenuation subdural collection over the left frontal lobe measuring up to 11 mm in thickness similar to prior. Additional small focus of subdural hemorrhage over the right parietal lobe measuring to 3 mm. Additional subdural hemorrhage along the posterior falx measuring up to 3 mm in thickness, similar to prior. No new or enlarging foci of intracranial hemorrhage. Slightly decreased pneumocephalus. Mass  effect is overall similar to prior with partial effacement of the left lateral ventricle. Trace blood products within the right occipital horn. Approximately 9 mm rightward midline shift not significantly changed from prior. Vascular: No hyperdense vessel or unexpected calcification. Skull: Postsurgical changes without acute or aggressive finding. Sinuses/Orbits: No acute finding. Other: Mastoid air cells are clear. ASPECTS [facility] Stroke Program Early CT Score) - Ganglionic level infarction (caudate, lentiform nuclei, internal capsule, insula, M1-M3 cortex): 7 - Supraganglionic infarction (M4-M6 cortex): 3 Total score (0-10 with 10 being normal): 10 IMPRESSION: 1. Redemonstrated postsurgical changes of left frontotemporal craniotomy for resection of left frontal lobe mass and parenchymal hematoma evacuation. 2. Similar appearance of multi compartment intracranial hemorrhage. No new or enlarging foci of intracranial hemorrhage. 3. Similar mass effect with approximately 9 mm rightward midline shift. 4. ASPECTS is 10 These results were communicated to Dr. Arora at 12:38 pm on 10/09/2023 by text page via the [facility] messaging system. Electronically Signed   By: Denny Flack M.D.   On: 10/09/2023 12:38   Recent Labs    10/10/23 0613 10/11/23 0708  WBC 8.9 10.2  HGB 10.3* 10.1*  HCT 30.2* 29.4*  PLT 217 235   Recent Labs    10/09/23 0537 10/10/23 0613  NA 132* 132*  K 4.2 4.8  CL 99 100  CO2 24 23  GLUCOSE 118* 110*  BUN 20 20  CREATININE 0.81 0.88  CALCIUM  8.5* 8.4*    Intake/Output Summary (Last 24 hours) at 10/11/2023 5284 Last data filed at 10/11/2023 0610 Gross per 24 hour  Intake 200 ml  Output 2000 ml  Net -1800 ml        Physical Exam: Vital Signs Blood pressure 118/62, pulse 75, temperature 98.9 F (37.2 C), temperature source Oral, resp. rate 17, height 5\' 11"  (1.803 m),  weight 76.2 kg, SpO2 99%.  General: Alert and oriented x 3, No apparent distress HEENT: left scalp with  curvilinear incision with suture, clean and dry. Some bruising. PERRLA, EOMI, sclera anicteric, oral mucosa pink and moist, dentition intact, ext ear canals clear,  Neck: Supple without JVD or lymphadenopathy Heart: Reg rate and rhythm. No murmurs rubs or gallops Chest: CTA bilaterally without wheezes, rales, or rhonchi; no distress Abdomen: distended. No bowel sounds. Non-tender.  Extremities: No clubbing, cyanosis, or edema. Pulses are 2+ Psych: Pt's affect is flat, anxious Skin: scalp as above. Scattered abrasions,bruises on arms. Neuro:  Pt alert. Follows basic commands. Oriented to person, hospital. Told me he lives in [place]. CN exam was non-focal. He moved all 4 limbs but was not completely cooperating Musculoskeletal: Full ROM, No pain with AROM or PROM in the neck, trunk, or extremities. Posture appropriate     Assessment/Plan: 1. Functional deficits which require 3+ hours per day of interdisciplinary therapy in a comprehensive inpatient rehab setting. Physiatrist is providing close team supervision and 24 hour management of active medical problems listed below. Physiatrist and rehab team continue to assess barriers to discharge/monitor patient progress toward functional and medical goals  Care Tool:  Bathing              Bathing assist       Upper Body Dressing/Undressing Upper body dressing        Upper body assist      Lower Body Dressing/Undressing Lower body dressing            Lower body assist       Toileting Toileting    Toileting assist       Transfers Chair/bed transfer  Transfers assist           Locomotion Ambulation   Ambulation assist              Walk 10 feet activity   Assist           Walk 50 feet activity   Assist           Walk 150 feet activity   Assist           Walk 10 feet on uneven surface  activity   Assist           Wheelchair     Assist               Wheelchair 50  feet with 2 turns activity    Assist            Wheelchair 150 feet activity     Assist          Blood pressure 118/62, pulse 75, temperature 98.9 F (37.2 C), temperature source Oral, resp. rate 17, height 5\' 11"  (1.803 m), weight 76.2 kg, SpO2 99%.  Medical Problem List and Plan: 1. Functional deficits secondary to traumatic SDH s/p crani/evacuation x2  -pt also with meningioma s/p resection             -patient may  shower -cover incision/crani             -ELOS/Goals: 9-12 days min A -Patient is beginning CIR therapies today including PT, OT, and SLP as tolerated given ileus 2.  Antithrombotics: -DVT/anticoagulation:  Pharmaceutical: Lovenox  added on 05/15.             -antiplatelet therapy: N/A 3. Pain Management:  Oxycodone  prn. Pt denies pain 4. Mood/Behavior/Sleep: LCSW to follow for evaluation and  support.              -antipsychotic agents: N/A  -pt very reliant on wife for emotional support, decision-making 5. Neuropsych/cognition: This patient may not be fully capable of making decisions on his own behalf. 6. Skin/Wound Care: Routine pressure relief Mesures.   -continue scalp sutures for now 7. Fluids/Electrolytes/Nutrition: Monitor I/O. Check CMET in a.              --monitor intake--did not want to eat "wife will feed me" 8. Vasovagal syncope/post ictal aphasia?: On keppra  750 mg BID per Dr. Arora --monitor for orthostatic changes. stopped Flomax per wife request  -5/16- spoke with wife who has concerns that the increased keppra  dose has affected his mentation. I contacted Dr. Bonnita Buttner who felt that we could go back to the 500mg  bid dose for now and observe.  9. Abdominal distension:   had BM yesterday --Hx of sigmoid volvulus 09/2017.   5/16-colonic ileus on KUB yesterday. Don't need another KUB this morning as exam is unchanged--recheck KUB tomorrow -CMET pending -clears only -IVF, IV reglan  -SSE today -(may have to wait for wife to be present to begin  these interventions) 10. Urinary retention: Hx of Prostate CA s/p prostatectomy/XRT --Will d/c flomax per multiple wife's request.  --Continue finasteride which was added on 05/13 -5/14-can continue foley until ileus is resolved 11. Hyponatremia: Monitor for now. 12. PAF: Monitor HR TID--on amiodarone.  --Not on [employer] per patient, chart review and pharmacy review. 13. Acute blood loss anemia:    -5/16 hgb stable at 10.1 today 14. H/o LBP radiating to LLE/Neurogenic claudication: Monitor for symptoms with increase in activity.    LOS: 1 days A FACE TO FACE EVALUATION WAS PERFORMED  Rawland Caddy 10/11/2023, 8:12 AM

## 2023-10-11 NOTE — Progress Notes (Addendum)
 Occupational Therapy Session Note  Patient Details  Name: Jesse [name] MRN: [id number] Date of Birth: 07-01-1940  Today's Date: 10/12/2023 OT Individual Time: 0920-1015 OT Individual Time Calculation (min): 55 min    Short Term Goals: Week 1:  OT Short Term Goal 1 (Week 1): Pt will thread LB garments with Min A + LRAD. OT Short Term Goal 2 (Week 1): Pt will complete 1/3 toileting activities with CGA + LRAD. OT Short Term Goal 3 (Week 1): Pt will perform toilet transfer with CGA + LRAD.  Skilled Therapeutic Interventions/Progress Updates:  Pt received resting in bed, spouse at bedside. Pt with no reports of pain this AM session. Transition to EOB with Min A for trunk elevation/stabilization. Sitting EOB, pt completes full-body sponge bathing and dressing requiring setup/supervision for UB and Min A for B lower legs bathing and threading of BLE into garments due to increased fear of falling forward and limited reach. Standing hike with increased time but overall CGA-Min A + unilateral support on RW for standing balance. Pt then performs ambulatory transfer from room<>day room with CGA-Min A + Max cuing to manage forward trunk flexion, decreased step height/length, and safe RW management. In day room, pt completes x10 mins of Nustep modality for BUE/BLE strengthening/endurance, tolerated well on level 1 resistance with ~2 min break at 5 min-mark. VSS. Pt remained sitting on BSC/toilet to encourage BM, spouse at side, NT made aware.   Therapy Documentation Precautions:  Precautions Precautions: Fall, Other (comment) Recall of Precautions/Restrictions: Impaired Precaution/Restrictions Comments: L crani, Seizure, HOH, hx of dementia, orthostatic Restrictions Weight Bearing Restrictions Per Provider Order: No   Therapy/Group: Individual Therapy  Artemus Biles, OTR/L, MSOT  10/12/2023, 11:52 AM

## 2023-10-12 ENCOUNTER — Inpatient Hospital Stay (HOSPITAL_COMMUNITY)

## 2023-10-12 DIAGNOSIS — K567 Ileus, unspecified: Secondary | ICD-10-CM

## 2023-10-12 DIAGNOSIS — E871 Hypo-osmolality and hyponatremia: Secondary | ICD-10-CM

## 2023-10-12 LAB — MAGNESIUM: Magnesium: 1.9 mg/dL (ref 1.7–2.4)

## 2023-10-12 LAB — BASIC METABOLIC PANEL WITH GFR
Anion gap: 9 (ref 5–15)
BUN: 15 mg/dL (ref 8–23)
CO2: 21 mmol/L — ABNORMAL LOW (ref 22–32)
Calcium: 8.2 mg/dL — ABNORMAL LOW (ref 8.9–10.3)
Chloride: 101 mmol/L (ref 98–111)
Creatinine, Ser: 0.68 mg/dL (ref 0.61–1.24)
GFR, Estimated: >60 mL/min (ref >60)
Glucose, Bld: 109 mg/dL — ABNORMAL HIGH (ref 70–99)
Potassium: 4.3 mmol/L (ref 3.5–5.1)
Sodium: 131 mmol/L — ABNORMAL LOW (ref 135–145)

## 2023-10-12 NOTE — Plan of Care (Signed)
  Problem: RH BOWEL ELIMINATION Goal: RH STG MANAGE BOWEL WITH ASSISTANCE Description: STG Manage Bowel with supervision Assistance. Outcome: Progressing   Problem: RH SKIN INTEGRITY Goal: RH STG SKIN FREE OF INFECTION/BREAKDOWN Description: Manage skin free of infection/breakdown with supervision Outcome: Progressing   Problem: RH SAFETY Goal: RH STG ADHERE TO SAFETY PRECAUTIONS W/ASSISTANCE/DEVICE Description: STG Adhere to Safety Precautions With  supervision Assistance/Device. Outcome: Progressing   Problem: RH PAIN MANAGEMENT Goal: RH STG PAIN MANAGED AT OR BELOW PT'S PAIN GOAL Description: <4 w/ prns Outcome: Progressing

## 2023-10-12 NOTE — Progress Notes (Addendum)
 PROGRESS NOTE   Subjective/Complaints: Patient did have bowel movement today at about 4 PM,, medium in size.  Abdominal x-ray with no significant change severe colonic ileus.  Wife reports he was able to tolerate his full liquid diet, feels hungry.  ROS: Limited due to cognitive/behavioral    Objective:   DG Abd 1 View Result Date: 10/12/2023 CLINICAL DATA:  Follow-up ileus. EXAM: ABDOMEN - 1 VIEW COMPARISON:  10/10/2023 FINDINGS: Diffuse colonic dilatation shows no significant change compared to prior study. No dilated small bowel loops seen. These findings are consistent with severe colonic ileus. IMPRESSION: No significant change in severe colonic ileus. Electronically Signed   By: Marlyce Sine M.D.   On: 10/12/2023 10:43   Recent Labs    10/10/23 0613 10/11/23 0708  WBC 8.9 10.2  HGB 10.3* 10.1*  HCT 30.2* 29.4*  PLT 217 235   Recent Labs    10/11/23 0708 10/12/23 0558  NA 130* 131*  K 4.0 4.3  CL 99 101  CO2 24 21*  GLUCOSE 112* 109*  BUN 21 15  CREATININE 0.87 0.68  CALCIUM  8.3* 8.2*    Intake/Output Summary (Last 24 hours) at 10/12/2023 1710 Last data filed at 10/12/2023 1348 Gross per 24 hour  Intake 360 ml  Output 2625 ml  Net -2265 ml        Physical Exam: Vital Signs Blood pressure 106/69, pulse 76, temperature 97.6 F (36.4 C), resp. rate 18, height 5\' 11"  (1.803 m), weight 76.2 kg, SpO2 99%.  General: Alert and oriented x 3, No apparent distress HEENT: left scalp with curvilinear incision with suture, clean and dry. Some bruising. PERRLA, EOMI, sclera anicteric, oral mucosa pink and moist, dentition intact, ext ear canals clear,  Neck: Supple without JVD or lymphadenopathy Heart: Reg rate and rhythm. No murmurs rubs or gallops Chest: CTA bilaterally without wheezes, rales, or rhonchi; no distress Abdomen: distended-wife reports decreased from yesterday.  Hypoactive bowel sounds. Non-tender.   Extremities: No clubbing, cyanosis, or edema. Pulses are 2+ Psych: Pt's affect is flat, anxious Skin: scalp as above. Scattered abrasions,bruises on arms. Neuro:  Pt alert. Follows basic commands. Oriented to person, hospital. Told me he lives in [place]. CN exam was non-focal. He moved all 4 limbs but was not completely cooperating Musculoskeletal: Full ROM, No pain with AROM or PROM in the neck, trunk, or extremities. Posture appropriate     Assessment/Plan: 1. Functional deficits which require 3+ hours per day of interdisciplinary therapy in a comprehensive inpatient rehab setting. Physiatrist is providing close team supervision and 24 hour management of active medical problems listed below. Physiatrist and rehab team continue to assess barriers to discharge/monitor patient progress toward functional and medical goals  Care Tool:  Bathing    Body parts bathed by patient: Right arm, Left arm, Chest, Abdomen, Right upper leg, Left upper leg, Front perineal area, Face   Body parts bathed by helper: Buttocks, Right lower leg, Left lower leg     Bathing assist Assist Level: Minimal Assistance - Patient > 75%     Upper Body Dressing/Undressing Upper body dressing   What is the patient wearing?: Pull over shirt    Upper  body assist Assist Level: Minimal Assistance - Patient > 75%    Lower Body Dressing/Undressing Lower body dressing      What is the patient wearing?: Pants     Lower body assist Assist for lower body dressing: Moderate Assistance - Patient 50 - 74%     Toileting Toileting    Toileting assist Assist for toileting: Moderate Assistance - Patient 50 - 74%     Transfers Chair/bed transfer  Transfers assist     Chair/bed transfer assist level: Minimal Assistance - Patient > 75%     Locomotion Ambulation   Ambulation assist      Assist level: Minimal Assistance - Patient > 75% Assistive device: Walker-rolling Max distance: 75'   Walk 10 feet  activity   Assist     Assist level: Minimal Assistance - Patient > 75% Assistive device: Walker-rolling   Walk 50 feet activity   Assist Walk 50 feet with 2 turns activity did not occur: Safety/medical concerns  Assist level: Minimal Assistance - Patient > 75% Assistive device: Walker-rolling    Walk 150 feet activity   Assist Walk 150 feet activity did not occur: Safety/medical concerns  Assist level: 2 helpers Assistive device: Walker-rolling    Walk 10 feet on uneven surface  activity   Assist Walk 10 feet on uneven surfaces activity did not occur: Safety/medical concerns         Wheelchair     Assist Is the patient using a wheelchair?: Yes Type of Wheelchair: Manual    Wheelchair assist level: Dependent - Patient 0% Max wheelchair distance: 150'    Wheelchair 50 feet with 2 turns activity    Assist        Assist Level: Dependent - Patient 0%   Wheelchair 150 feet activity     Assist      Assist Level: Dependent - Patient 0%   Blood pressure 106/69, pulse 76, temperature 97.6 F (36.4 C), resp. rate 18, height 5\' 11"  (1.803 m), weight 76.2 kg, SpO2 99%.  Medical Problem List and Plan: 1. Functional deficits secondary to traumatic SDH s/p crani/evacuation x2  -pt also with meningioma s/p resection             -patient may  shower -cover incision/crani             -ELOS/Goals: 9-12 days min A -Patient is beginning CIR therapies today including PT, OT, and SLP as tolerated given ileus 2.  Antithrombotics: -DVT/anticoagulation:  Pharmaceutical: Lovenox  added on 05/15.             -antiplatelet therapy: N/A 3. Pain Management:  Oxycodone  prn. Pt denies pain 4. Mood/Behavior/Sleep: LCSW to follow for evaluation and support.              -antipsychotic agents: N/A  -pt very reliant on wife for emotional support, decision-making 5. Neuropsych/cognition: This patient may not be fully capable of making decisions on his own behalf. 6.  Skin/Wound Care: Routine pressure relief Mesures.   -continue scalp sutures for now 7. Fluids/Electrolytes/Nutrition: Monitor I/O. Check CMET in a.              --monitor intake--did not want to eat "wife will feed me"  -10/12/23 BMP stabe, Cr 0.68 8. Vasovagal syncope/post ictal aphasia?: On keppra  750 mg BID per Dr. Arora --monitor for orthostatic changes. stopped Flomax  per wife request  -5/16- spoke with wife who has concerns that the increased keppra  dose has affected his mentation. I contacted Dr. Arora  who felt that we could go back to the 500mg  bid dose for now and observe.  9. Abdominal distension:   had BM yesterday --Hx of sigmoid volvulus 09/2017.   5/16-colonic ileus on KUB yesterday. Don't need another KUB this morning as exam is unchanged--recheck KUB tomorrow -CMET pending -clears only -IVF, IV reglan  -SSE today -(may have to wait for wife to be present to begin these interventions) -5/17 + medium BM today, reports appetite is a little improving, continue current diet, Reglan  and reassess tomorrow -soap suds enema today 10. Urinary retention: Hx of Prostate CA s/p prostatectomy/XRT --Will d/c flomax  per multiple wife's request.  --Continue finasteride  which was added on 05/13 -5/14-can continue foley until ileus is resolved 11. Hyponatremia: Monitor for now.  -5/17 stable at 131 12. PAF: Monitor HR TID--on amiodarone.  --Not on [facility] per patient, chart review and pharmacy review. -5/17 HR stable monitor    10/12/2023    1:45 PM 10/12/2023    5:40 AM 10/11/2023    7:35 PM  Vitals with BMI  Systolic 106 107 161  Diastolic 69 66 66  Pulse 76 70 76    13. Acute blood loss anemia:    -5/16 hgb stable at 10.1 today 14. H/o LBP radiating to LLE/Neurogenic claudication: Monitor for symptoms with increase in activity.    LOS: 2 days A FACE TO FACE EVALUATION WAS PERFORMED  Lylia Sand 10/12/2023, 5:10 PM

## 2023-10-12 NOTE — Progress Notes (Signed)
 Speech Language Pathology Daily Session Note  Patient Details  Name: Jesse [name] MRN: [id number] Date of Birth: [birth year]  Today's Date: 10/12/2023 SLP Individual Time: 7829-5621 SLP Individual Time Calculation (min): 56 min  Short Term Goals: Week 1: SLP Short Term Goal 1 (Week 1): Patient will demonstrate simple problem solving skills during functional tasks given mod multimodal A SLP Short Term Goal 2 (Week 1): Patient will demonstrate carryover of basic daily information given mod multimodal A SLP Short Term Goal 3 (Week 1): Patient will demonstrate sustained attention during functional tasks for upwards of 10 minutes given mod multimodal A SLP Short Term Goal 4 (Week 1): Patient will recall current medical situations and 2 functional deficits given mod multimodal A  Skilled Therapeutic Interventions:  Patient was seen in am to address cognitive re- training. Pt was easily alerted upon SLP arrival though requiring intermittent realerting cues throughout session. [name] was at bedside and was an active participant in session. Pt indep oriented to self, temporal, and spatial concepts. He recalled current medical situation including events leading up to hospitalization with min A and physical deficits with mod A. When asked, pt recalled events of morning including participation in therapy session, which included walking and "rowing on a machine." He also recalled components of full liquid diet at breakfast meal this am given min A from his wife. SLP challenging pt in simple problem solving involving current environment. Pt aware of recommendations to not ambulate indep and able to identify call button in order to request assist. SLP further challenged pt's problem solving through simple math word problems. Pt completing task with mod A. SLP further addressed recall through novel recall of paragraph. SLP introduced repetition and association strategies with pt able to recall information after a  distracted delay with 60% acc indep improving to 100% with min A. At conclusion of session, pt was left in bed with call button within reach, bed alarm active, and wife present. SLP to continue POC.   Pain Pain Assessment Pain Scale: 0-10 Pain Score: 0-No pain  Therapy/Group: Individual Therapy  Adela Holter 10/12/2023, 12:40 PM

## 2023-10-12 NOTE — Progress Notes (Signed)
 Physical Therapy TBI Note  Patient Details  Name: Jesse [name] MRN: [id number] Date of Birth: June 13, 1940  Today's Date: 10/12/2023 PT Individual Time: 1420-1530 PT Individual Time Calculation (min): 70 min   Short Term Goals: Week 1:  PT Short Term Goal 1 (Week 1): Pt will complete bed mobility with supervision PT Short Term Goal 2 (Week 1): Pt will complete bed to chair transfer with CGA PT Short Term Goal 3 (Week 1): Pt will ambulate x50' with minA and LRAD. PT Short Term Goal 4 (Week 1): Pt will complete balance assessment  Skilled Therapeutic Interventions/Progress Updates:    Pt presents in room in bed, agreeable to PT. Pt reporting "uncomfortable" while supine but denies pain once upright. Vitals assessed and WNL throughout session, pt denies symptoms. Session focused on therapeutic activities for bed mobility and transfer training, vitals management, and education on motor planning as well as bowel management, NMR for dynamic standing balance, postural stability, single limb stability and BUE/BLE/core muscle fiber recruitment, and gait training for upright posture and gait mechanics. Pt completes bed mobility with increased time to initiate, mod verbal and tactile cues for initiating and attending to task, CGA. Pt completes transfers with CGA/min assist throughout session with RW. Therapist dons shoes with total assist for time management, pt then completes stand step transfer bed to [facility] with min assist with RW. Pt transported to day room dependently. Pt ambulates 45' with RW CGA/min assist with cues for upright posture and increasing step length as pt demonstrating NBOS, slight scissoring, flexed knees throughout, and short stride length bilaterally, pt improves upright posture with cues. Pt completes NMR with BUE support on RW for BUE/BLE/core muscle fiber recruitment, dynamic standing balance, single limb stability including: - step taps 4" step x10 BLE - squats x10 (CGA, cues for  technique and upright posture between reps) - heel raises x10 - palloff press x10 - overhead press x10 - hamstring curls x20 alternating BLE Pt completes TUG in 52.69 seconds indicating increased risk for falls. Pt then ambulates to nustep, completes continuous training on nustep x10 min L1 with cues for completing full ROM, completed as NMR for BUE/BLE coordination and muscle fiber recruitment as well as for bowel stimulation. Pt then completes ambulatory transfer back to [facility] for seated rest break, then ambulates with RW and CGA/min assist with +2 WC follow back to room with verbal cues for upright posture, gait mechanics, and environmental awareness. Pt returns to room and comes to sitting on BSC over toilet to attempt bowel movement with pt wife within arms reach and verbalizing will pull cord for assistance once pt is finished. Pt RN notified of pt position at end of session.  Therapy Documentation Precautions:  Precautions Precautions: Fall, Other (comment) Recall of Precautions/Restrictions: Impaired Precaution/Restrictions Comments: L crani, Seizure, HOH, hx of dementia, orthostatic Restrictions Weight Bearing Restrictions Per Provider Order: No  Agitated Behavior Scale: TBI Observation Details Observation Environment: CIR Start of observation period - Date: 10/12/23 Start of observation period - Time: 1420 End of observation period - Date: 10/12/23 End of observation period - Time: 1530 Agitated Behavior Scale (DO NOT LEAVE BLANKS) Short attention span, easy distractibility, inability to concentrate: Absent Impulsive, impatient, low tolerance for pain or frustration: Absent Uncooperative, resistant to care, demanding: Absent Violent and/or threatening violence toward people or property: Absent Explosive and/or unpredictable anger: Absent Rocking, rubbing, moaning, or other self-stimulating behavior: Absent Pulling at tubes, restraints, etc.: Absent Wandering from treatment  areas: Absent Restlessness, pacing, excessive movement:  Absent Repetitive behaviors, motor, and/or verbal: Absent Rapid, loud, or excessive talking: Absent Sudden changes of mood: Absent Easily initiated or excessive crying and/or laughter: Absent Self-abusiveness, physical and/or verbal: Absent Agitated behavior scale total score: 14    Therapy/Group: Individual Therapy  Jesse [name] PT, DPT 10/12/2023, 3:36 PM

## 2023-10-13 ENCOUNTER — Inpatient Hospital Stay (HOSPITAL_COMMUNITY)

## 2023-10-13 DIAGNOSIS — D62 Acute posthemorrhagic anemia: Secondary | ICD-10-CM

## 2023-10-13 MED ORDER — POLYETHYLENE GLYCOL 3350 17 G PO PACK
17.0000 g | PACK | Freq: Every day | ORAL | Status: DC
  Administered 2023-10-13 – 2023-10-22 (×9): 17 g via ORAL
  Filled 2023-10-13 (×10): qty 1

## 2023-10-13 NOTE — Progress Notes (Addendum)
 PROGRESS NOTE   Subjective/Complaints: Pt reports his nausea and abdominal distention is improved.  Wife not in the room this afternoon when I spoke with him however I did see her briefly as he was on his way to x-ray.  Diet was advanced to soft, ate 100% and reports he tolerated it.  Wife has some concerns that Reglan  is making him sleepy.  ROS: Limited due to cognitive/behavioral    Objective:   DG Abd 2 Views Result Date: 10/13/2023 CLINICAL DATA:  Ileus. EXAM: ABDOMEN - 2 VIEW COMPARISON:  Radiograph yesterday FINDINGS: No change in the marked gaseous colonic distension particularly in the left hemiabdomen. Moderate stool is seen in the ascending colon. No convincing small bowel dilatation on the current exam. No evidence of free air. IMPRESSION: No change in marked gaseous colonic distension, typical of colonic ileus. Electronically Signed   By: Chadwick Colonel M.D.   On: 10/13/2023 12:30   DG Abd 1 View Result Date: 10/12/2023 CLINICAL DATA:  Follow-up ileus. EXAM: ABDOMEN - 1 VIEW COMPARISON:  10/10/2023 FINDINGS: Diffuse colonic dilatation shows no significant change compared to prior study. No dilated small bowel loops seen. These findings are consistent with severe colonic ileus. IMPRESSION: No significant change in severe colonic ileus. Electronically Signed   By: Marlyce Sine M.D.   On: 10/12/2023 10:43   Recent Labs    10/11/23 0708  WBC 10.2  HGB 10.1*  HCT 29.4*  PLT 235   Recent Labs    10/11/23 0708 10/12/23 0558  NA 130* 131*  K 4.0 4.3  CL 99 101  CO2 24 21*  GLUCOSE 112* 109*  BUN 21 15  CREATININE 0.87 0.68  CALCIUM  8.3* 8.2*    Intake/Output Summary (Last 24 hours) at 10/13/2023 1534 Last data filed at 10/13/2023 1312 Gross per 24 hour  Intake 560 ml  Output 2325 ml  Net -1765 ml        Physical Exam: Vital Signs Blood pressure 106/64, pulse 77, temperature 98 F (36.7 C), temperature  source Oral, resp. rate 18, height 5\' 11"  (1.803 m), weight 76.2 kg, SpO2 97%.  General: Alert and oriented x 3, No apparent distress, sitting in chair, appears comfortable HEENT: left scalp with curvilinear incision with suture, clean and dry. Some bruising. PERRLA, EOMI, sclera anicteric, oral mucosa pink and moist Neck: Supple without JVD or lymphadenopathy Heart: Reg rate and rhythm. No murmurs rubs or gallops Chest: CTA bilaterally without wheezes, rales, or rhonchi; no distress Abdomen: Distended improved again from yesterday.  Hypoactive bowel sounds.  Abdomen non-tender Extremities: No clubbing, cyanosis, or edema. Pulses are 2+ Psych: Pt's affect is flat, anxious Skin: scalp as above. Scattered abrasions,bruises on arms. Neuro:  Pt alert overall little drowsy appearing when first seen, later appeared more alert  Follows basic commands. Oriented to person, hospital.  CN exam was non-focal. He moved all 4 limbs Musculoskeletal: Full ROM, No pain with AROM or PROM in the neck, trunk, or extremities. Posture appropriate     Assessment/Plan: 1. Functional deficits which require 3+ hours per day of interdisciplinary therapy in a comprehensive inpatient rehab setting. Physiatrist is providing close team supervision and 24 hour  management of active medical problems listed below. Physiatrist and rehab team continue to assess barriers to discharge/monitor patient progress toward functional and medical goals  Care Tool:  Bathing    Body parts bathed by patient: Right arm, Left arm, Chest, Abdomen, Right upper leg, Left upper leg, Front perineal area, Face   Body parts bathed by helper: Buttocks, Right lower leg, Left lower leg     Bathing assist Assist Level: Minimal Assistance - Patient > 75%     Upper Body Dressing/Undressing Upper body dressing   What is the patient wearing?: Pull over shirt    Upper body assist Assist Level: Minimal Assistance - Patient > 75%    Lower Body  Dressing/Undressing Lower body dressing      What is the patient wearing?: Pants     Lower body assist Assist for lower body dressing: Moderate Assistance - Patient 50 - 74%     Toileting Toileting    Toileting assist Assist for toileting: Moderate Assistance - Patient 50 - 74%     Transfers Chair/bed transfer  Transfers assist     Chair/bed transfer assist level: Minimal Assistance - Patient > 75%     Locomotion Ambulation   Ambulation assist      Assist level: Minimal Assistance - Patient > 75% Assistive device: Walker-rolling Max distance: 75'   Walk 10 feet activity   Assist     Assist level: Minimal Assistance - Patient > 75% Assistive device: Walker-rolling   Walk 50 feet activity   Assist Walk 50 feet with 2 turns activity did not occur: Safety/medical concerns  Assist level: Minimal Assistance - Patient > 75% Assistive device: Walker-rolling    Walk 150 feet activity   Assist Walk 150 feet activity did not occur: Safety/medical concerns  Assist level: 2 helpers Assistive device: Walker-rolling    Walk 10 feet on uneven surface  activity   Assist Walk 10 feet on uneven surfaces activity did not occur: Safety/medical concerns         Wheelchair     Assist Is the patient using a wheelchair?: Yes Type of Wheelchair: Manual    Wheelchair assist level: Dependent - Patient 0% Max wheelchair distance: 150'    Wheelchair 50 feet with 2 turns activity    Assist        Assist Level: Dependent - Patient 0%   Wheelchair 150 feet activity     Assist      Assist Level: Dependent - Patient 0%   Blood pressure 106/64, pulse 77, temperature 98 F (36.7 C), temperature source Oral, resp. rate 18, height 5\' 11"  (1.803 m), weight 76.2 kg, SpO2 97%.  Medical Problem List and Plan: 1. Functional deficits secondary to traumatic SDH s/p crani/evacuation x2  -pt also with meningioma s/p resection             -patient  may  shower -cover incision/crani             -ELOS/Goals: 9-12 days min A -Continue CIR PT, OT, and SLP 2.  Antithrombotics: -DVT/anticoagulation:  Pharmaceutical: Lovenox  added on 05/15.             -antiplatelet therapy: N/A 3. Pain Management:  Oxycodone  prn. Pt denies pain 4. Mood/Behavior/Sleep: LCSW to follow for evaluation and support.              -antipsychotic agents: N/A  -pt very reliant on wife for emotional support, decision-making 5. Neuropsych/cognition: This patient may not be fully capable of making  decisions on his own behalf. 6. Skin/Wound Care: Routine pressure relief Mesures.   -continue scalp sutures for now 7. Fluids/Electrolytes/Nutrition: Monitor I/O. Check CMET in a.              --monitor intake--did not want to eat "wife will feed me"  -10/12/23 BMP stabe, Cr 0.68  Recheck tomorrow  8. Vasovagal syncope/post ictal aphasia?: On keppra  750 mg BID per Dr. Arora --monitor for orthostatic changes. stopped Flomax  per wife request  -5/16- spoke with wife who has concerns that the increased keppra  dose has affected his mentation. I contacted Dr. Bonnita Buttner who felt that we could go back to the 500mg  bid dose for now and observe.   5/18 discussed prior decrease decrease Keppra  dose 500 mg twice daily with wife   9. Abdominal distension:   had BM yesterday --Hx of sigmoid volvulus 09/2017.   5/16-colonic ileus on KUB yesterday. Don't need another KUB this morning as exam is unchanged--recheck KUB tomorrow -CMET pending -clears only -IVF, IV reglan  -SSE today -(may have to wait for wife to be present to begin these interventions) -5/17 + medium BM today, reports appetite is a little improving, continue current diet, Reglan  and reassess tomorrow -soap suds enema today 5/18 soft diet started, patient's wife feels like Reglan  is over sedating him.  He is tolerating soft diet today.    Discussed with GI Reglan  would not likely provide much benefit for colonic ileus, will  discontinue this.   Clinically doing much better. -GI recommended considering Linzess to 290 mcg daily, consider enema tomorrow if not improving.  Wife would like him not to have Linzess, reports his colon is chronically distended due to prior surgery.  She said he always has bowel movements in the evening after drinking water.  Will hold off on Linzess today consider if not improving. 10. Urinary retention: Hx of Prostate CA s/p prostatectomy/XRT --Will d/c flomax  per multiple wife's request.  --Continue finasteride  which was added on 05/13 -5/14-can continue foley until ileus is resolved 5/18 wife indicates that she would like to continue this for now and follow-up with urology outpatient 11. Hyponatremia: Monitor for now.  -5/17 stable at 131 12. PAF: Monitor HR TID--on amiodarone.  --Not on [facility] per patient, chart review and pharmacy review. -5/17-18 HR stable monitor    10/13/2023    1:50 PM 10/13/2023    5:17 AM 10/12/2023    7:30 PM  Vitals with BMI  Systolic 106 121 161  Diastolic 64 76 69  Pulse 77 71 69    13. Acute blood loss anemia:    -5/16 hgb stable at 10.1 today\  Recheck tomorrow 14. H/o LBP radiating to LLE/Neurogenic claudication: Monitor for symptoms with increase in activity.   LOS: 3 days A FACE TO FACE EVALUATION WAS PERFORMED  Lylia Sand 10/13/2023, 3:34 PM

## 2023-10-13 NOTE — Plan of Care (Signed)
  Problem: Consults Goal: RH BRAIN INJURY PATIENT EDUCATION Description: Description: See Patient Education module for eduction specifics Outcome: Progressing   Problem: RH BOWEL ELIMINATION Goal: RH STG MANAGE BOWEL WITH ASSISTANCE Description: STG Manage Bowel with supervision Assistance. Outcome: Progressing   Problem: RH BLADDER ELIMINATION Goal: RH STG MANAGE BLADDER WITH ASSISTANCE Description: STG Manage Bladder With supervision Assistance Outcome: Progressing   Problem: RH SKIN INTEGRITY Goal: RH STG SKIN FREE OF INFECTION/BREAKDOWN Description: Manage skin free of infection/breakdown with supervision Outcome: Progressing   Problem: RH SAFETY Goal: RH STG ADHERE TO SAFETY PRECAUTIONS W/ASSISTANCE/DEVICE Description: STG Adhere to Safety Precautions With  supervision Assistance/Device. Outcome: Progressing   Problem: RH COGNITION-NURSING Goal: RH STG USES MEMORY AIDS/STRATEGIES W/ASSIST TO PROBLEM SOLVE Description: STG Uses Memory Aids/Strategies With supervision Assistance to Problem Solve. Outcome: Progressing   Problem: RH PAIN MANAGEMENT Goal: RH STG PAIN MANAGED AT OR BELOW PT'S PAIN GOAL Description: <4 w/ prns Outcome: Progressing   Problem: RH KNOWLEDGE DEFICIT BRAIN INJURY Goal: RH STG INCREASE KNOWLEDGE OF SELF CARE AFTER BRAIN INJURY Description: Manage increase knowledge of self care after brain injury with supervision assistance from wife using educational materials provided Outcome: Progressing

## 2023-10-14 LAB — BASIC METABOLIC PANEL WITH GFR
Anion gap: 7 (ref 5–15)
BUN: 16 mg/dL (ref 8–23)
CO2: 25 mmol/L (ref 22–32)
Calcium: 8.2 mg/dL — ABNORMAL LOW (ref 8.9–10.3)
Chloride: 100 mmol/L (ref 98–111)
Creatinine, Ser: 0.82 mg/dL (ref 0.61–1.24)
GFR, Estimated: >60 mL/min (ref >60)
Glucose, Bld: 87 mg/dL (ref 70–99)
Potassium: 4.1 mmol/L (ref 3.5–5.1)
Sodium: 132 mmol/L — ABNORMAL LOW (ref 135–145)

## 2023-10-14 LAB — CBC
HCT: 29 % — ABNORMAL LOW (ref 39.0–52.0)
Hemoglobin: 9.7 g/dL — ABNORMAL LOW (ref 13.0–17.0)
MCH: 30 pg (ref 26.0–34.0)
MCHC: 33.4 g/dL (ref 30.0–36.0)
MCV: 89.8 fL (ref 80.0–100.0)
Platelets: 220 10*3/uL (ref 150–400)
RBC: 3.23 MIL/uL — ABNORMAL LOW (ref 4.22–5.81)
RDW: 13.8 % (ref 11.5–15.5)
WBC: 6 10*3/uL (ref 4.0–10.5)
nRBC: 0 % (ref 0.0–0.2)

## 2023-10-14 MED ORDER — CALCIUM CARBONATE ANTACID 500 MG PO CHEW
800.0000 mg | CHEWABLE_TABLET | Freq: Every day | ORAL | Status: DC
  Administered 2023-10-14 – 2023-10-22 (×9): 800 mg via ORAL
  Filled 2023-10-14 (×10): qty 4

## 2023-10-14 MED ORDER — SODIUM CHLORIDE 0.9 % IV BOLUS
1000.0000 mL | Freq: Once | INTRAVENOUS | Status: AC
Start: 2023-10-14 — End: 2023-10-15
  Administered 2023-10-14: 1000 mL via INTRAVENOUS

## 2023-10-14 MED ORDER — SODIUM CHLORIDE 0.9 % IV BOLUS: 1000.0000 mL | Freq: Once | INTRAVENOUS | Status: DC

## 2023-10-14 NOTE — Progress Notes (Signed)
 PROGRESS NOTE   Subjective/Complaints: Patient refused orthostatic vitals this morning stated that he could not sit or stand up. A.m. labs with improving hyponatremia 132, BUN and creatinine stable.  CBC stable. Some mild hypotension on vitals overnight. Has maintain Foley, had a small bowel movement yesterday.  Large urinary output greater than 2 L/day, with net -1.7 L yesterday.  Wife at bedside, considerable concerns regarding medications.  Discussed this patient's long medical history and endorsing that he is currently at his gastrointestinal baseline, requesting no further KUBs, requesting no GI consult or further adjustment of bowel regimen.  Also, noting that patient cannot tolerate Flomax , and concern regarding tolerance of Proscar  as well.  Considerable concerns regarding Keppra  dosing.  Later, also discusses with nursing concerns regarding hyponatremia and hypocalcemia.  During therapies this afternoon, blood pressure reduced into the systolic 70s.  Improved with 500 cc IV fluid bolus.  No focal neurologic deficits noted, although patient generally felt "not good".  ROS: Limited due to cognitive/behavioral    Objective:   DG Abd 2 Views Result Date: 10/13/2023 CLINICAL DATA:  Ileus. EXAM: ABDOMEN - 2 VIEW COMPARISON:  Radiograph yesterday FINDINGS: No change in the marked gaseous colonic distension particularly in the left hemiabdomen. Moderate stool is seen in the ascending colon. No convincing small bowel dilatation on the current exam. No evidence of free air. IMPRESSION: No change in marked gaseous colonic distension, typical of colonic ileus. Electronically Signed   By: Chadwick Colonel M.D.   On: 10/13/2023 12:30   DG Abd 1 View Result Date: 10/12/2023 CLINICAL DATA:  Follow-up ileus. EXAM: ABDOMEN - 1 VIEW COMPARISON:  10/10/2023 FINDINGS: Diffuse colonic dilatation shows no significant change compared to prior study. No  dilated small bowel loops seen. These findings are consistent with severe colonic ileus. IMPRESSION: No significant change in severe colonic ileus. Electronically Signed   By: Marlyce Sine M.D.   On: 10/12/2023 10:43   Recent Labs    10/14/23 0640  WBC 6.0  HGB 9.7*  HCT 29.0*  PLT 220   Recent Labs    10/12/23 0558 10/14/23 0640  NA 131* 132*  K 4.3 4.1  CL 101 100  CO2 21* 25  GLUCOSE 109* 87  BUN 15 16  CREATININE 0.68 0.82  CALCIUM  8.2* 8.2*    Intake/Output Summary (Last 24 hours) at 10/14/2023 0756 Last data filed at 10/14/2023 0600 Gross per 24 hour  Intake 683 ml  Output 2400 ml  Net -1717 ml        Physical Exam: Vital Signs Blood pressure 113/71, pulse 66, temperature 97.8 F (36.6 C), resp. rate 18, height 5\' 11"  (1.803 m), weight 74.6 kg, SpO2 99%.  General: Alert and oriented x 3, No apparent distress, sitting up in bed, appears comfortable. HEENT: left scalp with curvilinear incision with suture, clean and dry.   PERRLA, EOMI, sclera anicteric, oral mucosa pink and moist Neck: Supple without JVD or lymphadenopathy Heart: Reg rate and rhythm. No murmurs rubs or gallops Chest: CTA bilaterally without wheezes, rales, or rhonchi; no distress Abdomen: Distended and hypoactive bowel sounds, especially in bilateral upper quadrants.  Abdomen non-tender Extremities: No clubbing, cyanosis, or edema.  Pulses are 2+ Psych: Pt's affect is flat, anxious Skin: scalp as above. Scattered abrasions,bruises on arms. Neuro: patient awake and alert on exam.  Oriented x 3.  Distractible, poor attention and concentration.  Moderate memory deficits.  Follows simple commands appropriately.   Musculoskeletal: Full ROM, No pain with AROM or PROM in the neck, trunk, or extremities. Posture appropriate     Assessment/Plan: 1. Functional deficits which require 3+ hours per day of interdisciplinary therapy in a comprehensive inpatient rehab setting. Physiatrist is providing close  team supervision and 24 hour management of active medical problems listed below. Physiatrist and rehab team continue to assess barriers to discharge/monitor patient progress toward functional and medical goals  Care Tool:  Bathing    Body parts bathed by patient: Right arm, Left arm, Chest, Abdomen, Right upper leg, Left upper leg, Front perineal area, Face   Body parts bathed by helper: Buttocks, Right lower leg, Left lower leg     Bathing assist Assist Level: Minimal Assistance - Patient > 75%     Upper Body Dressing/Undressing Upper body dressing   What is the patient wearing?: Pull over shirt    Upper body assist Assist Level: Minimal Assistance - Patient > 75%    Lower Body Dressing/Undressing Lower body dressing      What is the patient wearing?: Pants     Lower body assist Assist for lower body dressing: Moderate Assistance - Patient 50 - 74%     Toileting Toileting    Toileting assist Assist for toileting: Moderate Assistance - Patient 50 - 74%     Transfers Chair/bed transfer  Transfers assist     Chair/bed transfer assist level: Minimal Assistance - Patient > 75%     Locomotion Ambulation   Ambulation assist      Assist level: Minimal Assistance - Patient > 75% Assistive device: Walker-rolling Max distance: 75'   Walk 10 feet activity   Assist     Assist level: Minimal Assistance - Patient > 75% Assistive device: Walker-rolling   Walk 50 feet activity   Assist Walk 50 feet with 2 turns activity did not occur: Safety/medical concerns  Assist level: Minimal Assistance - Patient > 75% Assistive device: Walker-rolling    Walk 150 feet activity   Assist Walk 150 feet activity did not occur: Safety/medical concerns  Assist level: 2 helpers Assistive device: Walker-rolling    Walk 10 feet on uneven surface  activity   Assist Walk 10 feet on uneven surfaces activity did not occur: Safety/medical concerns          Wheelchair     Assist Is the patient using a wheelchair?: Yes Type of Wheelchair: Manual    Wheelchair assist level: Dependent - Patient 0% Max wheelchair distance: 150'    Wheelchair 50 feet with 2 turns activity    Assist        Assist Level: Dependent - Patient 0%   Wheelchair 150 feet activity     Assist      Assist Level: Dependent - Patient 0%   Blood pressure 113/71, pulse 66, temperature 97.8 F (36.6 C), resp. rate 18, height 5\' 11"  (1.803 m), weight 74.6 kg, SpO2 99%.  Medical Problem List and Plan: 1. Functional deficits secondary to traumatic SDH s/p crani/evacuation x2  -pt also with meningioma s/p resection             -patient may  shower -cover incision/crani             -  ELOS/Goals: 9-12 days min A -Continue CIR PT, OT, and SLP 5-19: Per discharge summary, Decadron  taper was to be stopped; will initiate slow taper over the next 8 days given ongoing blood pressure issues.  2.  Antithrombotics: -DVT/anticoagulation:  Pharmaceutical: Lovenox  added on 05/15.             -antiplatelet therapy: N/A 3. Pain Management:  Oxycodone  prn. Pt denies pain 4. Mood/Behavior/Sleep: LCSW to follow for evaluation and support.              -antipsychotic agents: N/A  -pt very reliant on wife for emotional support, decision-making 5. Neuropsych/cognition: This patient may not be fully capable of making decisions on his own behalf. 6. Skin/Wound Care: Routine pressure relief Mesures.   -continue scalp sutures for now 7. Fluids/Electrolytes/Nutrition: Monitor I/O. Check CMET in a.              --monitor intake--did not want to eat "wife will feed me"  -10/12/23 BMP stabe, Cr 0.68  -5-19: BUN/creatinine stable, hyponatremia improved  8. Vasovagal syncope/post ictal aphasia?: On keppra  750 mg BID per Dr. Arora --monitor for orthostatic changes. stopped Flomax  per wife request  -5/16- spoke with wife who has concerns that the increased keppra  dose has  affected his mentation. I contacted Dr. Bonnita Buttner who felt that we could go back to the 500mg  bid dose for now and observe.   5/18 discussed prior decrease decrease Keppra  dose 500 mg twice daily with wife  5-19:  Wife requesting EEG or reduction of Keppra ; given no acute concern for seizures, do not feel EEG would be beneficial at this time.  Will discuss with Dr. Arora further dose reduction before initiating.  9. Abdominal distension:   had BM yesterday --Hx of sigmoid volvulus 09/2017.   5/16-colonic ileus on KUB yesterday. Don't need another KUB this morning as exam is unchanged--recheck KUB tomorrow -CMET pending -clears only -IVF, IV reglan  -SSE today -(may have to wait for wife to be present to begin these interventions) -5/17 + medium BM today, reports appetite is a little improving, continue current diet, Reglan  and reassess tomorrow -soap suds enema today 5/18 soft diet started, patient's wife feels like Reglan  is over sedating him.  He is tolerating soft diet today.    Discussed with GI Reglan  would not likely provide much benefit for colonic ileus, will discontinue this.   Clinically doing much better. -GI recommended considering Linzess to 290 mcg daily, consider enema tomorrow if not improving.  Wife would like him not to have Linzess, reports his colon is chronically distended due to prior surgery.  She said he always has bowel movements in the evening after drinking water.  Will hold off on Linzess today consider if not improving. 5-19: Producing small soft, soft bowel movements approximately twice daily.  Tolerating p.o. diet.  Continue current regimen, encourage p.o. fluids.  Per wife, despite ongoing colonic distention and large stool burden on x-ray, wishes to not escalate bowel regimen further and requests no further imaging, no GI input/consultation.  10. Urinary retention: Hx of Prostate CA s/p prostatectomy/XRT --Will d/c flomax  per multiple wife's request.  --Continue  finasteride  which was added on 05/13 -5/14-can continue foley until ileus is resolved 5/18 wife indicates that she would like to continue this for now and follow-up with urology outpatient--informed nursing of this  11. Hyponatremia: Monitor for now.  -5/17 stable at 131  Stable to slightly improved 132 5-19  12. PAF: Monitor HR TID--on amiodarone.  --Not  on [facility] per patient, chart review and pharmacy review. -5/17-18 HR stable monitor    10/14/2023    5:55 AM 10/13/2023    8:33 PM 10/13/2023    4:16 PM  Vitals with BMI  Weight 164 lbs 7 oz    BMI 22.95    Systolic 113 102 161  Diastolic 71 57 68  Pulse 66 74 77    13. Acute blood loss anemia:    -5/16 hgb stable at 10.1 today\  Recheck tomorrow--stable, 9.7  14. H/o LBP radiating to LLE/Neurogenic claudication: Monitor for symptoms with increase in activity.   15.  Hypotension.  - 5-19: Encourage p.o. fluids, added TED hose.  Will avoid binder due to gastrointestinal issues as above.  Later, received IV fluid 1 L for SBP 70s.;  Responsive.  Monitor tomorrow with therapies, may need addition of midodrine.  LOS: 4 days A FACE TO FACE EVALUATION WAS PERFORMED  Bea Lime 10/14/2023, 7:56 AM

## 2023-10-14 NOTE — Progress Notes (Signed)
 Patient is unable to sit and stand for orthostatic VS.MD notified

## 2023-10-14 NOTE — Progress Notes (Signed)
 [facility]patient [facility] Individual Statement of Services  Patient Name:  Jesse [name]  Date:  10/14/2023  Welcome to the [facility]patient [facility].  Our goal is to provide you with an individualized program based on your diagnosis and situation, designed to meet your specific needs.  With this comprehensive rehabilitation program, you will be expected to participate in at least 3 hours of rehabilitation therapies Monday-Friday, with modified therapy programming on the weekends.  Your rehabilitation program will include the following services:  Physical Therapy (PT), Occupational Therapy (OT), Speech Therapy (ST), 24 hour per day rehabilitation nursing, Therapeutic Recreaction (TR), Neuropsychology, Care Coordinator, Rehabilitation Medicine, Nutrition Services, and Pharmacy Services  Weekly team conferences will be held on Tuesdays to discuss your progress.  Your Inpatient Rehabilitation Care Coordinator will talk with you frequently to get your input and to update you on team discussions.  Team conferences with you and your family in attendance may also be held.  Expected length of stay: 10-12 days  Overall anticipated outcome: Supervision - Minimal assist  Depending on your progress and recovery, your program may change. Your Inpatient Rehabilitation Care Coordinator will coordinate services and will keep you informed of any changes. Your Inpatient Rehabilitation Care Coordinator's name and contact numbers are listed  below.  The following services may also be recommended but are not provided by the Inpatient Rehabilitation Center:  Driving Evaluations Home Health Rehabiltiation Services Outpatient Rehabilitation Services Vocational Rehabilitation   Arrangements will be made to provide these services after discharge if needed.  Arrangements include referral to agencies that provide these services.  Your insurance has been verified to be:  Medicare A+B; BCBS secondary Your  primary doctor is:  Valdene Garret, MD  Pertinent information will be shared with your doctor and your insurance company.  Inpatient Rehabilitation Care Coordinator:  Kathey Pang [phone] or [phone]  Information discussed with and copy given to patient by: Naoma Bacca, 10/14/2023, 10:42 AM

## 2023-10-14 NOTE — Progress Notes (Signed)
 Proscar  d/c due to recurrent issues with orthostatic hypotension. Informed by nurse that SBP lower in 70's after OT worked with him. Will treat with NaCL X 1 liter. Chart reviewed and note patient has had issues with syncope as well as urinary retention in the past. Also had PAF after surgery. Will check EKG also to rule out arrhthymias.  TSH-1.44 on 05/06. BMET/CBC today stable. No signs of infection.

## 2023-10-14 NOTE — Progress Notes (Signed)
 Occupational Therapy TBI Note  Patient Details  Name: Jesse [name] MRN: [id number] Date of Birth: Jan 24, 1941  Today's Date: 10/14/2023 OT Individual Time: 1421-1501 OT Individual Time Calculation (min): 40 min  20 min missed time 2/2 fatigue   Short Term Goals: Week 1:  OT Short Term Goal 1 (Week 1): Pt will thread LB garments with Min A + LRAD. OT Short Term Goal 2 (Week 1): Pt will complete 1/3 toileting activities with CGA + LRAD. OT Short Term Goal 3 (Week 1): Pt will perform toilet transfer with CGA + LRAD.  Skilled Therapeutic Interventions/Progress Updates:  Pt supine with no c/o pain but stating "I cannot get up". Skilled monitoring of vitals throughout session to ensure hemodynamic stability. BP assessed supine and it was 92/51.HR 70 Thigh high teds donned. Pt came to EOB with min A.  BP assessed seated and was 110/59, HR 75. He stood from EOB with min A using RW and BP assessed- 92/65, HR 87. He was able to maintain stand with CGA, however the longer he stood the more he began sinking into flexed knees. He returned to sitting. He was agreeable to try more functional mobility to assess impact on BP. He stood with CGA. Using the RW he completed 100 ft of functional mobility with CGA- min A overall. He reported feeling "terrible" and he was assisted into sitting. BP assessed and it was 115/72. He returned to his room ,another 100 ft, with similar assist. He requested to get back into bed and was left supine with all needs met. Encouraged PO intake and pt able to take several sips of water. 700 cc urine emptied from foley. HOB elevated to 40 degrees to assist with BP stability.      Therapy Documentation Precautions:  Precautions Precautions: Fall, Other (comment) Recall of Precautions/Restrictions: Impaired Precaution/Restrictions Comments: L crani, Seizure, HOH, hx of dementia, orthostatic Restrictions Weight Bearing Restrictions Per Provider Order: No    Agitated Behavior  Scale: TBI Observation Details Observation Environment: CIR Start of observation period - Date: 10/14/23 Start of observation period - Time: 1420 End of observation period - Date: 10/14/23 End of observation period - Time: 1500 Agitated Behavior Scale (DO NOT LEAVE BLANKS) Short attention span, easy distractibility, inability to concentrate: Present to a slight degree Impulsive, impatient, low tolerance for pain or frustration: Absent Uncooperative, resistant to care, demanding: Absent Violent and/or threatening violence toward people or property: Absent Explosive and/or unpredictable anger: Absent Rocking, rubbing, moaning, or other self-stimulating behavior: Absent Pulling at tubes, restraints, etc.: Absent Wandering from treatment areas: Absent Restlessness, pacing, excessive movement: Absent Repetitive behaviors, motor, and/or verbal: Absent Rapid, loud, or excessive talking: Absent Sudden changes of mood: Absent Easily initiated or excessive crying and/or laughter: Absent Self-abusiveness, physical and/or verbal: Absent Agitated behavior scale total score: 15   Therapy/Group: Individual Therapy  [name] 10/14/2023, 3:16 PM

## 2023-10-14 NOTE — Progress Notes (Signed)
 Physical Therapy TBI Note  Patient Details  Name: Jesse [name] MRN: [id number] Date of Birth: 08-Nov-1940  Today's Date: 10/14/2023 PT Individual Time: 1050-1200 PT Individual Time Calculation (min): 70 min   Short Term Goals: Week 1:  PT Short Term Goal 1 (Week 1): Pt will complete bed mobility with supervision PT Short Term Goal 2 (Week 1): Pt will complete bed to chair transfer with CGA PT Short Term Goal 3 (Week 1): Pt will ambulate x50' with minA and LRAD. PT Short Term Goal 4 (Week 1): Pt will complete balance assessment  Skilled Therapeutic Interventions/Progress Updates:     Pt received seated in WC and agrees to therapy. Reports pain but does not verbalize location of pain. PT provides rest breaks as needed to manage pain. Pt performs x20 alternating LAQs prior to mobility. PT assesses resting HR and BP, 75 bpm and 85/55. PT encourages fluid intake and pt does not verbalize any symptoms of hypotension. Pt stands with minA and BP taken again in standing 75/50 with HR 86 bpm. Seated rest break. Pt then performs sit to stand with minA and ambulates x100' with minA and RW, with cues for upright posture to improve balance, and increasing stride length to decrease risk for falls. Pt verbalizes slight light headedness following ambulation but improves quickly upon sitting. Seated rest break. Pt then ambulates additional x100' and BP taken prior to pt sitting back down with reading of 91/56 and HR of 86 bpm.   Pt completes standing activity to challenge balance and coordination, tasked with removing squiqz placed on mirror. Pt completes x12 with RUE and x12 with LUE with seated rest break in between. PT provides CGA places bucket for squigz above eye level for increased challenge.    Pt ambulates x200' to dayroom with RW and minA, with same cues provided. Pt verbalizes some light headedness and increased work of breathing immediately following ambulation. PT provides cues for pursed lip  breathing to optimize oxygen sats. Pt transitions to supine with minA and cues for positioning. Pt completes 3x10 bridges in hooklying with tactile and verbal cues for correct performance and NM feedback. Supine to sit with minA. Stand pivot transfer to [facility] with CGA and cues for positioning. Left seated with all needs within reach.     Therapy Documentation Precautions: Precautions Precautions: Fall, Other (comment) Recall of Precautions/Restrictions: Impaired Precaution/Restrictions Comments: L crani, Seizure, HOH, hx of dementia, orthostatic Restrictions Weight Bearing Restrictions Per Provider Order: No    Therapy/Group: Individual Therapy  Neva Barban, PT, DPT 10/14/2023, 4:15 PM

## 2023-10-14 NOTE — Progress Notes (Signed)
 Speech Language Pathology Daily Session Note  Patient Details  Name: Jesse [name] MRN: [id number] Date of Birth: 08/27/1940  Today's Date: 10/14/2023 SLP Individual Time: 0802-0859 SLP Individual Time Calculation (min): 57 min  Short Term Goals: Week 1: SLP Short Term Goal 1 (Week 1): Patient will demonstrate simple problem solving skills during functional tasks given mod multimodal A SLP Short Term Goal 2 (Week 1): Patient will demonstrate carryover of basic daily information given mod multimodal A SLP Short Term Goal 3 (Week 1): Patient will demonstrate sustained attention during functional tasks for upwards of 10 minutes given mod multimodal A SLP Short Term Goal 4 (Week 1): Patient will recall current medical situations and 2 functional deficits given mod multimodal A  Skilled Therapeutic Interventions: Skilled therapy session focused on cognitive goals. Patient oriented x4 independently. SLP facilitated session by providing min-modA for patient to demonstrate awareness of deficits (physical and cognitive) due to current medical situation. SLP then reviewed memory strategies taught in prior sessions and introduced memory notebook and use. During session, RN administered medications crushed in puree, with liquid wash (consecutive sips via straw). No s/sx of aspiration present. Patient seemingly concerned about consuming soft diet, requesting for wife to be present due to lack of front teeth. SLP educated patient on his consumption of soft diet on prior day (per chart) with no difficulty. Patient refused PO consumption until wife is present. SLP continued to target cognition through simple money management task. Patient required supervision A to count coins according to verbalized amounts. Patient left in bed with alarm set and call bell in reach. Continue POC   Pain None reported   Therapy/Group: Individual Therapy  Ardith Lewman M.A., CCC-SLP 10/14/2023, 7:38 AM

## 2023-10-14 NOTE — Progress Notes (Signed)
 Vitals taken at 1515 not 1615  10/14/23 1615  Assess: MEWS Score  BP (!) 79/45  Pulse Rate 74  Assess: MEWS Score  MEWS Temp 0  MEWS Systolic 2  MEWS Pulse 0  MEWS RR 0  MEWS LOC 0  MEWS Score 2  MEWS Score Color Yellow  Assess: if the MEWS score is Yellow or Red  Were vital signs accurate and taken at a resting state? Yes  Does the patient meet 2 or more of the SIRS criteria? No  MEWS guidelines implemented  Yes, yellow  Treat  MEWS Interventions Considered administering scheduled or prn medications/treatments as ordered  Take Vital Signs  Increase Vital Sign Frequency  Yellow: Q2hr x1, continue Q4hrs until patient remains green for 12hrs  Escalate  MEWS: Escalate Yellow: Discuss with charge nurse and consider notifying provider and/or RRT  Notify: Charge Nurse/RN  Name of Charge Nurse/RN Notified Doraine Gallon, RN  Provider Notification  Provider Name/Title Jean Michaelis PA  Date Provider Notified 10/14/23  Time Provider Notified 1615  Method of Notification Call  Notification Reason Change in status  Provider response See new orders  Date of Provider Response 10/14/23  Time of Provider Response 1615  Assess: SIRS CRITERIA  SIRS Temperature  0  SIRS Respirations  0  SIRS Pulse 0  SIRS WBC 0  SIRS Score Sum  0

## 2023-10-14 NOTE — Plan of Care (Signed)
  Problem: RH SAFETY Goal: RH STG ADHERE TO SAFETY PRECAUTIONS W/ASSISTANCE/DEVICE Description: STG Adhere to Safety Precautions With cues Assistance/Device. Outcome: Progressing   Problem: RH PAIN MANAGEMENT Goal: RH STG PAIN MANAGED AT OR BELOW PT'S PAIN GOAL Description: Pain < 4 with prns Outcome: Progressing   Problem: RH KNOWLEDGE DEFICIT GENERAL Goal: RH STG INCREASE KNOWLEDGE OF SELF CARE AFTER HOSPITALIZATION Description: Patient and S.O. will be able to manage care at discharge using educational resources for medications and dietary modification independently Outcome: Progressing

## 2023-10-14 NOTE — Progress Notes (Signed)
 Inpatient Rehabilitation Care Coordinator Assessment and Plan Patient Details  Name: Jesse [name] MRN: [id number] Date of Birth: March 20, 1941  Today's Date: 10/14/2023  Hospital Problems: Principal Problem:   SDH (subdural hematoma) (HCC) Active Problems:   Status post resection of meningioma  Past Medical History:  Past Medical History:  Diagnosis Date   Arthritis    Atrial fibrillation (HCC)    Complication of anesthesia    Atrial fibrillation during surgery    Dementia (HCC)    Depression    Hard of hearing    Hx of constipation    Osteoporosis    DEXA 11/09/10. Completed treatemtn with Fosamax.   Prostate cancer (HCC)    Restless legs    Thyroid  disease    hypothyroidism   Vitiligo    Past Surgical History:  Past Surgical History:  Procedure Laterality Date   BACK SURGERY     COLONOSCOPY     CRANIOTOMY Left 10/03/2023   Procedure: CRANIOTOMY TUMOR EXCISION LEFT FRONTAL;  Surgeon: Elna Haggis, MD;  Location: [facility] OR;  Service: Neurosurgery;  Laterality: Left;  Left Frontal Craniotomy for Menigioma   CRANIOTOMY N/A 10/04/2023   Procedure: CRANIOTOMY HEMATOMA EVACUATION SUBDURAL;  Surgeon: Elna Haggis, MD;  Location: [facility] OR;  Service: Neurosurgery;  Laterality: N/A;  REPEAT   FLEXIBLE SIGMOIDOSCOPY N/A 09/27/2017   Procedure: FLEXIBLE SIGMOIDOSCOPY;  Surgeon: Kenney Peacemaker, MD;  Location: [facility] ENDOSCOPY;  Service: Endoscopy;  Laterality: N/A;   JOINT REPLACEMENT     LAMINECTOMY  10/2011   cervical and lumbar laminectomy   PROSTATE BIOPSY  07/10/2011   PROSTATE BIOPSY  09/22/15   TOTAL HIP ARTHROPLASTY Right 02/29/2012   TOTAL HIP ARTHROPLASTY Right 07/04/2016   Procedure: RIGHT TOTAL HIP ARTHROPLASTY ANTERIOR APPROACH;  Surgeon: Adonica Hoose, MD;  Location: [facility] OR;  Service: Orthopedics;  Laterality: Right;   TRANSURETHRAL RESECTION OF PROSTATE     Social History:  reports that he quit smoking about 49 years ago. His smoking use included cigarettes. He has never used  smokeless tobacco. He reports current alcohol use. He reports that he does not use drugs.  Family / Support Systems Marital Status: Married Patient Roles: Spouse Spouse/Significant Other: Dietitian Availability: 24/7  Social History Preferred language: English Religion: [employer] Literacy - How often do you need to have someone help you when you read instructions, pamphlets, or other written material from your doctor or pharmacy?: Never Writes: Yes Employment Status: Retired Education officer, community)  Abuse/Neglect Abuse/Neglect Assessment Can Be Completed: Yes Physical Abuse: Denies Verbal Abuse: Denies Sexual Abuse: Denies Exploitation of patient/patient's resources: Denies Self-Neglect: Denies  Patient response to: Social Isolation - How often do you feel lonely or isolated from those around you?: Never  Emotional Status  Patient is lethargic with sleep apnea noted.   Patient / Family Perceptions, Expectations & Goals  Wife reports gradual decline over the past month with shuffled gait and loss of balance; has slowly decreased interaction and engagement in conversations. Wife able to provide light physical assist at discharge and manage medications with 49 years of experience as a Engineer, civil (consulting). Would like for the patient to be able to get up and get dressed with minimal assistance.   Community Resources [employer]: None Premorbid Home Care/DME Agencies: None Is the patient able to respond to transportation needs?: Yes In the past 12 months, has lack of transportation kept you from medical appointments or from getting medications?: No In the past 12 months, has lack of transportation kept you from meetings,  work, or from getting things needed for daily living?: No Resource referrals recommended: Neuropsychology  Discharge Planning Living Arrangements: Spouse/significant other Support Systems: Spouse/significant other Type of Residence: Private residence Insurance  Resources: Electrical engineer Resources: Restaurant manager, fast food Screen Referred: No Living Expenses: Banker Management: Spouse Does the patient have any problems obtaining your medications?: No Care Coordinator Barriers to Discharge: Decreased caregiver support, Lack of/limited family support Care Coordinator Anticipated Follow Up Needs: HH/OP  Clinical Impression Patient post craniotomy for resection of meningioma and evacuation of SDH 10/03/23 and repeat evacuation of new SDH on 5/9 with evolution of left frontal mass on Decadron  and Keppra . Wife reports 3 ste in garage with rail.  Forrestine Ike B 10/14/2023, 10:05 AM

## 2023-10-15 DIAGNOSIS — Z86018 Personal history of other benign neoplasm: Secondary | ICD-10-CM

## 2023-10-15 DIAGNOSIS — I951 Orthostatic hypotension: Secondary | ICD-10-CM | POA: Insufficient documentation

## 2023-10-15 DIAGNOSIS — Z9889 Other specified postprocedural states: Secondary | ICD-10-CM

## 2023-10-15 DIAGNOSIS — R569 Unspecified convulsions: Secondary | ICD-10-CM

## 2023-10-15 MED ORDER — MIDODRINE HCL 5 MG PO TABS: 5.0000 mg | ORAL_TABLET | Freq: Two times a day (BID) | ORAL | Status: DC

## 2023-10-15 MED ORDER — MIDODRINE HCL 5 MG PO TABS
2.5000 mg | ORAL_TABLET | Freq: Two times a day (BID) | ORAL | Status: DC
  Administered 2023-10-15 – 2023-10-17 (×4): 2.5 mg via ORAL
  Filled 2023-10-15 (×4): qty 1

## 2023-10-15 MED ORDER — DEXAMETHASONE 0.5 MG PO TABS
1.0000 mg | ORAL_TABLET | Freq: Three times a day (TID) | ORAL | Status: AC
  Administered 2023-10-15 – 2023-10-16 (×6): 1 mg via ORAL
  Filled 2023-10-15 (×6): qty 2

## 2023-10-15 MED ORDER — DEXAMETHASONE 0.5 MG PO TABS
0.5000 mg | ORAL_TABLET | Freq: Two times a day (BID) | ORAL | Status: AC
  Administered 2023-10-19 – 2023-10-20 (×4): 0.5 mg via ORAL
  Filled 2023-10-15 (×4): qty 1

## 2023-10-15 MED ORDER — LEVETIRACETAM 250 MG PO TABS
250.0000 mg | ORAL_TABLET | Freq: Two times a day (BID) | ORAL | Status: DC
  Administered 2023-10-15 – 2023-10-22 (×14): 250 mg via ORAL
  Filled 2023-10-15 (×14): qty 1

## 2023-10-15 MED ORDER — DEXAMETHASONE 0.5 MG PO TABS
1.0000 mg | ORAL_TABLET | Freq: Two times a day (BID) | ORAL | Status: AC
  Administered 2023-10-17 – 2023-10-18 (×4): 1 mg via ORAL
  Filled 2023-10-15 (×4): qty 2

## 2023-10-15 MED ORDER — DEXAMETHASONE 0.5 MG PO TABS
0.5000 mg | ORAL_TABLET | Freq: Every day | ORAL | Status: AC
  Administered 2023-10-21 – 2023-10-22 (×2): 0.5 mg via ORAL
  Filled 2023-10-15 (×2): qty 1

## 2023-10-15 NOTE — Progress Notes (Signed)
 PROGRESS NOTE   Subjective/Complaints:  Patient again refused orthostatic vitals this a.m. due to symptomatic hypotension.  Does seem to be doing overall better, systolic 90-110s. Had 1 L IV fluids yesterday, approximately 500 cc intakes, remains net -1.8 L. Eating 35 to 100% of meals, Foley draining appropriately, last recorded bowel movement 5-18--wife reported patient had a large bowel movement this afternoon.   ROS: Limited due to cognitive/behavioral    Objective:   DG Abd 2 Views Result Date: 10/13/2023 CLINICAL DATA:  Ileus. EXAM: ABDOMEN - 2 VIEW COMPARISON:  Radiograph yesterday FINDINGS: No change in the marked gaseous colonic distension particularly in the left hemiabdomen. Moderate stool is seen in the ascending colon. No convincing small bowel dilatation on the current exam. No evidence of free air. IMPRESSION: No change in marked gaseous colonic distension, typical of colonic ileus. Electronically Signed   By: Chadwick Colonel M.D.   On: 10/13/2023 12:30   Recent Labs    10/14/23 0640  WBC 6.0  HGB 9.7*  HCT 29.0*  PLT 220   Recent Labs    10/14/23 0640  NA 132*  K 4.1  CL 100  CO2 25  GLUCOSE 87  BUN 16  CREATININE 0.82  CALCIUM  8.2*    Intake/Output Summary (Last 24 hours) at 10/15/2023 8657 Last data filed at 10/15/2023 8469 Gross per 24 hour  Intake 590 ml  Output 2475 ml  Net -1885 ml        Physical Exam: Vital Signs Blood pressure 97/62, pulse 64, temperature 97.9 F (36.6 C), resp. rate 17, height 5\' 11"  (1.803 m), weight 74.6 kg, SpO2 98%.  General: No acute distress, sitting upright in therapy gym.   HEENT: left scalp with curvilinear incision with suture, clean and dry.   PERRLA, EOMI, sclera anicteric, oral mucosa pink and moist Neck: Supple without JVD or lymphadenopathy Heart: Reg rate and rhythm. No murmurs rubs or gallops Chest: CTA bilaterally without wheezes, rales, or  rhonchi; no distress Abdomen: Distended and hypoactive bowel sounds, especially in bilateral upper quadrants.  Abdomen non-tender Extremities: No clubbing, cyanosis, or edema. Pulses are 2+ Psych: Pt's affect is flat, mildly anxious but overall appropriate. Skin: scalp as above. Scattered abrasions,bruises on arms. Neuro: patient awake and alert on exam.  Oriented x 3.  Fatigued with moderate memory deficits and mild concentration deficits Cranial nerves II through XII intact Moving all 4 extremities 4 out of 4 antigravity against resistance Sensation intact   Musculoskeletal: Full ROM, No pain with AROM or PROM in the neck, trunk, or extremities. Posture appropriate     Assessment/Plan: 1. Functional deficits which require 3+ hours per day of interdisciplinary therapy in a comprehensive inpatient rehab setting. Physiatrist is providing close team supervision and 24 hour management of active medical problems listed below. Physiatrist and rehab team continue to assess barriers to discharge/monitor patient progress toward functional and medical goals  Care Tool:  Bathing    Body parts bathed by patient: Right arm, Left arm, Chest, Abdomen, Right upper leg, Left upper leg, Front perineal area, Face   Body parts bathed by helper: Buttocks, Right lower leg, Left lower leg     Bathing  assist Assist Level: Minimal Assistance - Patient > 75%     Upper Body Dressing/Undressing Upper body dressing   What is the patient wearing?: Pull over shirt    Upper body assist Assist Level: Minimal Assistance - Patient > 75%    Lower Body Dressing/Undressing Lower body dressing      What is the patient wearing?: Pants     Lower body assist Assist for lower body dressing: Moderate Assistance - Patient 50 - 74%     Toileting Toileting    Toileting assist Assist for toileting: Moderate Assistance - Patient 50 - 74%     Transfers Chair/bed transfer  Transfers assist     Chair/bed  transfer assist level: Minimal Assistance - Patient > 75%     Locomotion Ambulation   Ambulation assist      Assist level: Minimal Assistance - Patient > 75% Assistive device: Walker-rolling Max distance: 75'   Walk 10 feet activity   Assist     Assist level: Minimal Assistance - Patient > 75% Assistive device: Walker-rolling   Walk 50 feet activity   Assist Walk 50 feet with 2 turns activity did not occur: Safety/medical concerns  Assist level: Minimal Assistance - Patient > 75% Assistive device: Walker-rolling    Walk 150 feet activity   Assist Walk 150 feet activity did not occur: Safety/medical concerns  Assist level: 2 helpers Assistive device: Walker-rolling    Walk 10 feet on uneven surface  activity   Assist Walk 10 feet on uneven surfaces activity did not occur: Safety/medical concerns         Wheelchair     Assist Is the patient using a wheelchair?: Yes Type of Wheelchair: Manual    Wheelchair assist level: Dependent - Patient 0% Max wheelchair distance: 150'    Wheelchair 50 feet with 2 turns activity    Assist        Assist Level: Dependent - Patient 0%   Wheelchair 150 feet activity     Assist      Assist Level: Dependent - Patient 0%   Blood pressure 97/62, pulse 64, temperature 97.9 F (36.6 C), resp. rate 17, height 5\' 11"  (1.803 m), weight 74.6 kg, SpO2 98%.  Medical Problem List and Plan: 1. Functional deficits secondary to traumatic SDH s/p crani/evacuation x2  -pt also with meningioma s/p resection             -patient may  shower -cover incision/crani             -ELOS/Goals: 9-12 days min A-- 5/27 DC date -Continue CIR PT, OT, and SLP 5-19: Per discharge summary, Decadron  taper was to be stopped; will initiate slow taper over the next 8 days given ongoing blood pressure issues. 5/20: Wife receptive to education. Min-Mod LBD, Mod for ambulation with orthostatics. May downgrade to Min A goals. Per  wife ambulation looking pretty close to normal. CGA for transfers and up to 200 ft ambulation with a walker. Mod A cognition.   2.  Antithrombotics: -DVT/anticoagulation:  Pharmaceutical: Lovenox  added on 05/15.             -antiplatelet therapy: N/A 3. Pain Management:  Oxycodone  prn. Pt denies pain 4. Mood/Behavior/Sleep: LCSW to follow for evaluation and support.              -antipsychotic agents: N/A  -pt very reliant on wife for emotional support, decision-making   -  5/20: Sleep log added per request  5. Neuropsych/cognition: This patient may  not be fully capable of making decisions on his own behalf.  5/20: Per wife will have intermittent muteness at baseline; ?underlying dementia vs. Mood/behavioral  6. Skin/Wound Care: Routine pressure relief Mesures.   -continue scalp sutures for now-- will be 10 days post-op 5/19.   7. Fluids/Electrolytes/Nutrition: Monitor I/O. Check CMET in a.              --monitor intake--did not want to eat "wife will feed me"  -10/12/23 BMP stabe, Cr 0.68  -5-19: BUN/creatinine stable, hyponatremia improved  8. Vasovagal syncope/post ictal aphasia?: On keppra  750 mg BID per Dr. Arora --monitor for orthostatic changes. stopped Flomax  per wife request  -5/16- spoke with wife who has concerns that the increased keppra  dose has affected his mentation. I contacted Dr. Bonnita Buttner who felt that we could go back to the 500mg  bid dose for now and observe.   5/18 discussed prior decrease decrease Keppra  dose 500 mg twice daily with wife  5-19:  Wife requesting EEG or reduction of Keppra ; given no acute concern for seizures, do not feel EEG would be beneficial at this time.  Will discuss with Dr. Arora further dose reduction before initiating.  5-20: Neurology reducing Keppra  dose per wife's request, after discussion of risks versus benefits.  9. Abdominal distension:   had BM yesterday --Hx of sigmoid volvulus 09/2017.   5/16-colonic ileus on KUB yesterday. Don't need  another KUB this morning as exam is unchanged--recheck KUB tomorrow -CMET pending -clears only -IVF, IV reglan  -SSE today -(may have to wait for wife to be present to begin these interventions) -5/17 + medium BM today, reports appetite is a little improving, continue current diet, Reglan  and reassess tomorrow -soap suds enema today 5/18 soft diet started, patient's wife feels like Reglan  is over sedating him.  He is tolerating soft diet today.    Discussed with GI Reglan  would not likely provide much benefit for colonic ileus, will discontinue this.   Clinically doing much better. -GI recommended considering Linzess to 290 mcg daily, consider enema tomorrow if not improving.  Wife would like him not to have Linzess, reports his colon is chronically distended due to prior surgery.  She said he always has bowel movements in the evening after drinking water.  Will hold off on Linzess today consider if not improving. 5-19: Producing small soft, soft bowel movements approximately twice daily.  Tolerating p.o. diet.  Continue current regimen, encourage p.o. fluids.  Per wife, despite ongoing colonic distention and large stool burden on x-ray, wishes to not escalate bowel regimen further and requests no further imaging, no GI input/consultation. 5-20: Large bowel movement this afternoon per wife  10. Urinary retention: Hx of Prostate CA s/p prostatectomy/XRT --Will d/c flomax  per multiple wife's request.  --Continue finasteride  which was added on 05/13 -5/14-can continue foley until ileus is resolved 5/18 wife indicates that she would like to continue this for now and follow-up with urology outpatient--informed nursing of this  11. Hyponatremia: Monitor for now.  -5/17 stable at 131  Stable to slightly improved 132 5-19  12. PAF: Monitor HR TID--on amiodarone.  --Not on [facility] per patient, chart review and pharmacy review. -5/17-18 HR stable monitor     10/15/2023    4:39 AM 10/15/2023   12:22 AM  10/14/2023    7:59 PM  Vitals with BMI  Systolic 97 111 103  Diastolic 62 76 63  Pulse 64 64 69    13. Acute blood loss anemia:    -  5/16 hgb stable at 10.1 today\  Recheck tomorrow--stable, 9.7  14. H/o LBP radiating to LLE/Neurogenic claudication: Monitor for symptoms with increase in activity.   15.  Hypotension.  - 5-19: Encourage p.o. fluids, added TED hose.  Will avoid binder due to gastrointestinal issues as above.  Later, received IV fluid 1 L for SBP 70s.;  Responsive.  Monitor tomorrow with therapies, may need addition of midodrine.  5-20: Remains with severe orthostatic hypotension after IV fluids yesterday, although with some improvement--per therapies will recover quickly once he is walking.  Add midodrine 2.5 mg twice daily with meals.  Check a.m. cortisol levels once off of Decadron .  LOS: 5 days A FACE TO FACE EVALUATION WAS PERFORMED  Bea Lime 10/15/2023, 9:38 AM

## 2023-10-15 NOTE — Progress Notes (Signed)
 Speech Language Pathology TBI Note  Patient Details  Name: Jesse [name] MRN: [id number] Date of Birth: 11-29-1940  Today's Date: 10/15/2023 SLP Individual Time: 1300-1400 SLP Individual Time Calculation (min): 60 min  Short Term Goals: Week 1: SLP Short Term Goal 1 (Week 1): Patient will demonstrate simple problem solving skills during functional tasks given mod multimodal A SLP Short Term Goal 2 (Week 1): Patient will demonstrate carryover of basic daily information given mod multimodal A SLP Short Term Goal 3 (Week 1): Patient will demonstrate sustained attention during functional tasks for upwards of 10 minutes given mod multimodal A SLP Short Term Goal 4 (Week 1): Patient will recall current medical situations and 2 functional deficits given mod multimodal A  Skilled Therapeutic Interventions:   Pt and his wife greeted in his room. He appeared fatigued, but was pleasant and cooperative throughout tx tasks targeting cognition and speech production. He benefited from modA cues to recall recent events to fill out his memory notebook for the day. He also benefited from maxA to recall timeline of recent medical hx. During verbal organization tasks x2 targeting attention, information processing, and problem solving, he required modA. As verbal tasks progressed, his vocal intensity noted to decline. [name] reported variable vocal intensity as of late and SLP provided education re overall endurance/attention. He also presented w/ reduced attention/increased processing time as tasks progressed and benefited from intermittent cognitive rest breaks. Recommend cont ST per POC.    Pain  No pain reported   Agitated Behavior Scale: TBI Observation Details Observation Environment: CIR Start of observation period - Date: 10/15/23 Start of observation period - Time: 1300 End of observation period - Date: 10/15/23 End of observation period - Time: 1400 Agitated Behavior Scale (DO NOT LEAVE  BLANKS) Short attention span, easy distractibility, inability to concentrate: Present to a slight degree Impulsive, impatient, low tolerance for pain or frustration: Absent Uncooperative, resistant to care, demanding: Absent Violent and/or threatening violence toward people or property: Absent Explosive and/or unpredictable anger: Present to a slight degree Rocking, rubbing, moaning, or other self-stimulating behavior: Absent Pulling at tubes, restraints, etc.: Absent Wandering from treatment areas: Absent Restlessness, pacing, excessive movement: Absent Repetitive behaviors, motor, and/or verbal: Absent Rapid, loud, or excessive talking: Absent Sudden changes of mood: Absent Easily initiated or excessive crying and/or laughter: Absent Self-abusiveness, physical and/or verbal: Absent Agitated behavior scale total score: 16  Therapy/Group: Individual Therapy  [name] 10/15/2023, 4:36 PM

## 2023-10-15 NOTE — Progress Notes (Signed)
 Physical Therapy TBI Note  Patient Details  Name: Jesse [name] MRN: [id number] Date of Birth: 1940-06-14  Today's Date: 10/15/2023 PT Individual Time: 1102-1200 PT Individual Time Calculation (min): 58 min   Short Term Goals: Week 1:  PT Short Term Goal 1 (Week 1): Pt will complete bed mobility with supervision PT Short Term Goal 2 (Week 1): Pt will complete bed to chair transfer with CGA PT Short Term Goal 3 (Week 1): Pt will ambulate x50' with minA and LRAD. PT Short Term Goal 4 (Week 1): Pt will complete balance assessment  Skilled Therapeutic Interventions/Progress Updates:     Pt received seated in recliner and appears lethargic, attempting to defer therapy at this time secondary to fatigue. No complaint of pain. PT encourages participation and pt continues to defer, but wife is able to provide firm encouragement and pt agrees to therapy. Sit to stand with minA and cues for hand placement and sequencing. Pt ambulates x125' to gym with RW and minA, with cues for upright posture to improve balance, as well as increasing stride length and gait speed to decrease risk for falls. Pt completes Nustep for endurance training as well as challenging attention to task and reciprocal coordination. Pt completes x10:00 on rolling hill mode with variable resistance form 3 to 8, with average steps per minute ~35. Following Nustep, pt's BP taken with reading of 91/61. Following seated rest break, pt ambulates x175' with RW and minA, with cues to increase stride length to decrease risk for falls, as well as maintaining upright gaze and increasing speed to improve balance. Following rest break, pt completes x8 reps of sit to stand without use of upper extremities to challenge balance, coordination, and strength. Pt ambulates back to room with RW and minA. Left seated on toilet with wife present.  Therapy Documentation Precautions:  Precautions Precautions: Fall, Other (comment) Recall of  Precautions/Restrictions: Impaired Precaution/Restrictions Comments: L crani, Seizure, HOH, hx of dementia, orthostatic Restrictions Weight Bearing Restrictions Per Provider Order: No  Agitated Behavior Scale: TBI Observation Details Observation Environment: CIR Start of observation period - Date: 10/15/23 Start of observation period - Time: 1100 End of observation period - Date: 10/15/23 End of observation period - Time: 1200 Agitated Behavior Scale (DO NOT LEAVE BLANKS) Short attention span, easy distractibility, inability to concentrate: Present to a slight degree Impulsive, impatient, low tolerance for pain or frustration: Absent Uncooperative, resistant to care, demanding: Absent Violent and/or threatening violence toward people or property: Absent Explosive and/or unpredictable anger: Absent Rocking, rubbing, moaning, or other self-stimulating behavior: Absent Pulling at tubes, restraints, etc.: Absent Wandering from treatment areas: Absent Restlessness, pacing, excessive movement: Absent Repetitive behaviors, motor, and/or verbal: Absent Rapid, loud, or excessive talking: Absent Sudden changes of mood: Absent Easily initiated or excessive crying and/or laughter: Absent Self-abusiveness, physical and/or verbal: Absent Agitated behavior scale total score: 15   Therapy/Group: Individual Therapy  Neva Barban, PT, DPT 10/15/2023, 4:45 PM

## 2023-10-15 NOTE — Progress Notes (Signed)
 Patient ID: Jesse [name], male   DOB: 1941/04/29, 83 y.o.   MRN: [id number] Met with the patient and wife to review team conference report. Reviewed recommending d/c date of  10/22/23 with goals for supervision - min assist overall and min assist for cognition. Reviewed recommendation for OP:PT/OT follow up; wife prefers [facility] [place]. Order referral sent to [facility] and will initiate services upon discharge.  DME recommended: shower chair and son plans to order on his own per patient's wife. Patient's wife acknowledge an understanding of and is in agreement with the discharge date and plan. Naoma Bacca

## 2023-10-15 NOTE — Progress Notes (Signed)
 Occupational Therapy Session Note  Patient Details  Name: Jesse [name] MRN: [id number] Date of Birth: 1941/03/02  Today's Date: 10/15/2023 OT Individual Time: 4098-1191 OT Individual Time Calculation (min): 71 min    Short Term Goals: Week 1:  OT Short Term Goal 1 (Week 1): Pt will thread LB garments with Min A + LRAD. OT Short Term Goal 2 (Week 1): Pt will complete 1/3 toileting activities with CGA + LRAD. OT Short Term Goal 3 (Week 1): Pt will perform toilet transfer with CGA + LRAD.  Skilled Therapeutic Interventions/Progress Updates:    Pt received supine with no c/o pain, agreeable to OT session. His wife arrived shortly after session began and was very involved/supportive. B thigh high teds donned. Skilled monitoring of vitals throughout session to ensure hemodynamic and cardiorespiratory stability. He came to EOB with min A. He sat EOB for several minutes to let BP adjust. He stood with min A from EOB using the RW. He completed standing level oral hygiene with CGA. He sat in the w/c for UB bathing- min cueing for thoroughness. He completed LB sit <> stand with min A for standing balance. He then completed 236 ft of functional mobility with the RW with min A, cueing required for upright posture and increasing stride length. Discussed shower transfers at length and provided education/demonstration on shower chair options. Wife actively involved in planning and decided on TTB use which she will buy privately. He completed another 236 ft of functional mobility back to his room with similar cueing required and assist. He was left sitting up in the recliner with feet elevated for BP control. Wife present and supervising.    BP Supine: 95/66 (76), HR 60 EOB: 113/76 (89), HR 66 In w/c following bathing: 103/66 (78), HR 58   Therapy Documentation Precautions:  Precautions Precautions: Fall, Other (comment) Recall of Precautions/Restrictions: Impaired Precaution/Restrictions Comments: L  crani, Seizure, HOH, hx of dementia, orthostatic Restrictions Weight Bearing Restrictions Per Provider Order: No  Therapy/Group: Individual Therapy  [name] 10/15/2023, 6:39 AM

## 2023-10-15 NOTE — Progress Notes (Signed)
 NEUROLOGY CONSULT FOLLOW UP NOTE   Date of service: Oct 15, 2023 Patient Name: Jesse [name] MRN:  [id number] DOB:  May 07, 1941  Interval Hx/subjective   Patient seen in room with wife at the bedside. He just finished with occupational therapy and is sitting up in the chair.  decrease the Keppra  dose to because Dr. Ellery Guthrie told her that he would only be on the medication for 7 days post op.  She is wondering if we are going to talk to the neurosurgery team regarding this medication.  She believes that it is making him more tired. He tells me that remembers his fall, states he lost his balance but does not know exactly what happened.  He is currently on Keppra  500mg  BID (which was decreased from 750mg  BID on 5/16)  On later MD evaluation wife is very adamant that Keppra  is causing his hypotension and has concerns in general about iatrogenic hypotension secondary to other medications as well  Vitals   Vitals:   10/14/23 1515 10/14/23 1959 10/15/23 0022 10/15/23 0439  BP: (!) 79/45 103/63 111/76 97/62  Pulse: 74 69 64 64  Resp:  18 16 17   Temp:  (!) 97.5 F (36.4 C) 97.6 F (36.4 C) 97.9 F (36.6 C)  TempSrc:      SpO2:  96% 99% 98%  Weight:      Height:         Body mass index is 22.94 kg/m.  Physical Exam   Constitutional: Appears well-developed and well-nourished.   Cardiovascular: Normal rate and regular rhythm.  Respiratory: Effort normal, non-labored breathing  Neuro: Mental Status: Patient is awake, but drowsy, oriented to person, place, month, year, and situation. Identifies wife correctly. Able to briefly tell me why he is in the hospital.  Patient is able to give a clear and coherent history. No signs of aphasia or neglect Cranial Nerves: II: Visual Fields are full. Pupils are equal, round, and reactive to light.   III,IV, VI: EOMI without ptosis or diploplia.  V: Facial sensation is symmetric to temperature VII: Facial movement is symmetric resting and  smiling VIII: Hearing is intact to voice X: Voice is hypophonic  XI: Shoulder shrug is symmetric. XII: Tongue protrudes midline without atrophy or fasciculations.  Motor: Tone is normal. Bulk is normal. 5/5 strength was present in all four extremities.  Sensory: Sensation is symmetric to light touch and temperature in the arms and legs. Cerebellar: FNF and HKS are intact bilaterally    Medications  Current Facility-Administered Medications:    acetaminophen  (TYLENOL ) tablet 325-650 mg, 325-650 mg, Oral, Q4H PRN, Love, Pamela S, PA-C   alum & mag hydroxide-simeth (MAALOX/MYLANTA) 200-200-20 MG/5ML suspension 30 mL, 30 mL, Oral, Q4H PRN, Love, Pamela S, PA-C   bisacodyl  (DULCOLAX) suppository 10 mg, 10 mg, Rectal, Daily PRN, Love, Pamela S, PA-C   calcium  carbonate (TUMS - dosed in mg elemental calcium ) chewable tablet 800 mg of elemental calcium , 800 mg of elemental calcium , Oral, Daily, Cherri Corns C, DO, 800 mg of elemental calcium  at 10/15/23 1191   Chlorhexidine  Gluconate Cloth 2 % PADS 6 each, 6 each, Topical, BID, Bea Lime, DO, 6 each at 10/15/23 4782   dexamethasone  (DECADRON ) tablet 1 mg, 1 mg, Oral, Q8H, 1 mg at 10/15/23 0628 **FOLLOWED BY** [START ON 10/17/2023] dexamethasone  (DECADRON ) tablet 1 mg, 1 mg, Oral, Q12H **FOLLOWED BY** [START ON 10/19/2023] dexamethasone  (DECADRON ) tablet 0.5 mg, 0.5 mg, Oral, Q12H **FOLLOWED BY** [START ON 10/21/2023] dexamethasone  (DECADRON ) tablet 0.5  mg, 0.5 mg, Oral, Daily, Bea Lime, DO   diphenhydrAMINE  (BENADRYL ) capsule 25 mg, 25 mg, Oral, Q6H PRN, Love, Pamela S, PA-C   enoxaparin  (LOVENOX ) injection 40 mg, 40 mg, Subcutaneous, Q24H, Love, Pamela S, PA-C, 40 mg at 10/14/23 1358   guaiFENesin -dextromethorphan (ROBITUSSIN DM) 100-10 MG/5ML syrup 5-10 mL, 5-10 mL, Oral, Q6H PRN, Love, Pamela S, PA-C   levETIRAcetam  (KEPPRA ) tablet 500 mg, 500 mg, Oral, BID, Rawland Caddy, MD, 500 mg at 10/15/23 0981   levothyroxine   (SYNTHROID ) tablet 50 mcg, 50 mcg, Oral, Q0600, Zelda Hickman, PA-C, 50 mcg at 10/15/23 1914   lidocaine  (XYLOCAINE ) 2 % jelly, , Topical, PRN, Love, Pamela S, PA-C   melatonin tablet 5 mg, 5 mg, Oral, QHS PRN, Love, Pamela S, PA-C   midodrine (PROAMATINE) tablet 2.5 mg, 2.5 mg, Oral, BID WC, Engler, Morgan C, DO   polyethylene glycol (MIRALAX  / GLYCOLAX ) packet 17 g, 17 g, Oral, Daily, Lylia Sand, MD, 17 g at 10/14/23 7829   prochlorperazine  (COMPAZINE ) tablet 5-10 mg, 5-10 mg, Oral, Q6H PRN **OR** prochlorperazine  (COMPAZINE ) suppository 12.5 mg, 12.5 mg, Rectal, Q6H PRN **OR** prochlorperazine  (COMPAZINE ) injection 5-10 mg, 5-10 mg, Intravenous, Q6H PRN, Love, Pamela S, PA-C   senna-docusate (Senokot-S) tablet 2 tablet, 2 tablet, Oral, Q breakfast, Love, Pamela S, PA-C, 2 tablet at 10/15/23 5621   sodium chloride  flush (NS) 0.9 % injection 3-10 mL, 3-10 mL, Intravenous, Q12H, Love, Pamela S, PA-C, 10 mL at 10/15/23 0859   sodium phosphate  (FLEET) enema 1 enema, 1 enema, Rectal, Once PRN, Jairo Mayer  Labs and Diagnostic Imaging   CBC:  Recent Labs  Lab 10/10/23 0613 10/11/23 0708 10/14/23 0640  WBC 8.9 10.2 6.0  NEUTROABS 7.0 7.8*  --   HGB 10.3* 10.1* 9.7*  HCT 30.2* 29.4* 29.0*  MCV 88.6 88.0 89.8  PLT 217 235 220    Basic Metabolic Panel:  Lab Results  Component Value Date   NA 132 (L) 10/14/2023   K 4.1 10/14/2023   CO2 25 10/14/2023   GLUCOSE 87 10/14/2023   BUN 16 10/14/2023   CREATININE 0.82 10/14/2023   CALCIUM  8.2 (L) 10/14/2023   GFRNONAA >60 10/14/2023   GFRAA >60 10/01/2017   Lipid Panel:  Lab Results  Component Value Date   LDLCALC 86 08/26/2020   HgbA1c:  Lab Results  Component Value Date   HGBA1C 5.7 08/26/2020   Urine Drug Screen:     Component Value Date/Time   LABOPIA NONE DETECTED 06/13/2017 1812   COCAINSCRNUR NONE DETECTED 06/13/2017 1812   LABBENZ NONE DETECTED 06/13/2017 1812   AMPHETMU NONE DETECTED 06/13/2017 1812    THCU NONE DETECTED 06/13/2017 1812   LABBARB NONE DETECTED 06/13/2017 1812    Alcohol Level     Component Value Date/Time   ETH <10 06/13/2017 1737   INR  Lab Results  Component Value Date   INR 1.2 10/04/2023   APTT  Lab Results  Component Value Date   APTT 26 10/04/2023   5/12 CT Head without contrast(Personally reviewed): 1. Continued interval evolution of postoperative changes from recent left frontal mass resection, and subsequent left frontal parenchymal hematoma evacuation. 2. Patchy foci of parenchymal hemorrhage within the left frontal lobe, non-progressed. Surrounding parenchymal edema has slightly progressed. 3. Residual mixed density subdural hematoma along the left cerebral convexity, measuring up to 11 mm in thickness.  4. Persistent mass effect with partial effacement of the left lateral ventricle. Decreased rightward midline shift now measuring  7-8 mm (previously 9 mm). 5. Thin subdural hematoma along the right cerebral convexity (measuring up to 3 mm in thickness), non-progressed. 6. Trace hemorrhage within the right lateral ventricle occipital horn, unchanged. 7. Subdural hemorrhage along the mid and posterior falx (measuring up to 3 mm in thickness), also not significantly changed.   5/14 CT Head without contrast: IMPRESSION: 1. Redemonstrated postsurgical changes of left frontotemporal craniotomy for resection of left frontal lobe mass and parenchymal hematoma evacuation. 2. Similar appearance of multi compartment intracranial hemorrhage. No new or enlarging foci of intracranial hemorrhage. 3. Similar mass effect with approximately 9 mm rightward midline shift. 4. ASPECTS is 10   CT angio Head and Neck with contrast(Personally reviewed): IMPRESSION: No large vessel occlusion. 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 Accounting for differences in modality there is slightly decreased mass effect on the distal ACA branches compared to  prior MRA. 1.5 mm outpouching along the right supraclinoid ICA at the origin of the posterior communicating artery which may reflect an infundibular origin versus less likely small aneurysm. Atherosclerosis in the neck as above without high-grade stenosis. Focal severe stenosis at the origin of the non dominant left vertebral artery on the aortic arch.  MRI Brain:  1. Status post resection of anterior left convexity meningioma. No residual contrast enhancing mass. 2. Left convexity subdural hematoma measures 7 mm in thickness. 2 mm posterior right convexity subdural hematoma. 3. Rightward midline shift of approximately 9 mm, improved.  rEEG: 5/14  IMPRESSION: This study is suggestive of cortical dysfunction arising from left hemisphere likely secondary to underlying structural abnormality/ SDH. No seizures or epileptiform discharges were seen throughout the recording.  Assessment   Jesse [name] is a 83 y.o. male status post resection of a meningioma and evacuation of subdural hematoma on the left convexity. There is concern for seizure activity being the cause of the first fall, however patient remembers fall and was alone. His wife found him layin gon the ground over his walker and his legs "weren't working." He initially was not able to answer questions appropriately but mental status improved in the ED. He underwent a meningioma resection with SDH evacuation on 5/8. On 5/14 a code stroke was activated due to aphasia, he was also found to be orthostatic at that time.  Per most recent neurosurgery note, Dr. Ellery Guthrie is leaving sutures in place until Tuesday (today) and is planning to see him in rehab today.   Detailed discussion by attending MD with wife.  Specifically we discussed at that occasionally especially in children Keppra  is associated with hypertension, and it would be very unusual for this to be associated with hypotension, although certainly could be contributing to drowsiness. We  discussed that given his renal function, 500 mg twice daily is the lowest dose expected to be effective to control his seizures. She understands risk of lowering his dose, specifically seizures which could lead to significant injury or death, but due to her concerns that he may be experiencing a very rare side effect of hypotension due to this medication she is adamant dose be reduced  Recommendations  - Reduce Keppra  to 250 mg BID, risk of breakthrough seizures dicussed with wife  - Continue inpatient seizure precautions  - Neurology will sign off at this time, outpatient neurology follow-up ______________________________________________________________________   Signed, Imogene Mana, NP [facility]  Attending Neurologist's note:  I personally saw this patient, gathering history, performing a full neurologic examination, reviewing relevant labs, personally  reviewing relevant imaging including most recent head CT, and formulated the assessment and plan, adding the note above for completeness and clarity to accurately reflect my thoughts   Baldwin Levee MD-PhD [facility] [phone]

## 2023-10-15 NOTE — Progress Notes (Signed)
 Met with patient and wife to review current situation, team conference and plan of care. Patient's wife expressed concern regarding medications/treatments for patients bowel obstruction, per wife patient has had the problem for 3 yrs and follows a pattern that works for the patient. Per wife, patient does not sleep well at night and requesting patients catheter in place until discharge follow up with their urologist. Per wife some medications are causing patients low BP (Flomax  was d/cd). Continue to follow along to provide educational needs to facilitate preparation for discharge.

## 2023-10-15 NOTE — Patient Care Conference (Signed)
 Inpatient RehabilitationTeam Conference and Plan of Care Update Date: 10/15/2023   Time: 1004 am    Patient Name: Jesse [name]      Medical Record Number: [id number]  Date of Birth: Mar 13, 1941 Sex: Male         Room/Bed: 4W11C/4W11C-01 Payor Info: Payor: MEDICARE / Plan: MEDICARE PART A AND B / Product Type: *No Product type* /    Admit Date/Time:  10/10/2023 12:46 PM  Primary Diagnosis:  SDH (subdural hematoma) [facility])  Hospital Problems: Principal Problem:   SDH (subdural hematoma) (HCC) Active Problems:   Status post resection of meningioma    Expected Discharge Date: Expected Discharge Date: 10/22/23  Team Members Present: Physician leading conference: Dr. Cherri Corns Social Worker Present: Other (comment) Forrestine Ike , RN) Nurse Present: Jerene Monks, RN PT Present: Theodis Fiscal, PT OT Present: Eilene Grater, OT SLP Present: Tally Faes, SLP     Current Status/Progress Goal Weekly Team Focus  Bowel/Bladder   Continent of BM. Indewlling foley catheter in place.   Maintain BM continence and continue foley useage. (Per chart wife wants to keep in to follow up with urology outpatient.)   Assist with toileting as needed, continue foley care and CHG baths.    Swallow/Nutrition/ Hydration   soft diet/ thin liquids           ADL's   (S) UB ADLs, min A LB, Min-CGA with RW, very limited by fatigue and endurance.   Supervision   ADLs, transfers, endurance, cog retraining    Mobility   minA bed mobility, transfers, and ambulation x200' with RW   supervision  family ed, endurance, balance, gait    Communication                Safety/Cognition/ Behavioral Observations  modA   minA   intellectual awareness of deficits, simple problem solving, attention, short term memory    Pain   No c/o pain   Pain <3/10   Assess Qshift and prn    Skin   Surgical incision CDI with sutures in place. Skin tear to elbow, Remainder of skin intact   Promote healing and  prevention of infection.  Assess qshift and prn      Discharge Planning:  Discharge home with wife, multi ste entry (3 ste in garage). 1 local son (travels a lot for work) Has DME w/c, RW, Paediatric nurse, and [name]    Team Discussion: Patient was admitted post Craniotomy/evacuation due to traumatic SDH. Patient has meningioma s/ p resection. Patient has sigmoid volvulus and follows home regimen that works. Patient has hypotension: medications adjusted by MD. Patient limited by cognitive deficits,poor po intake, fatigue and poor endurance.   Patient on target to meet rehab goals: yes, patient requires supervision with upper body care and minimal assistance with lower body care. Patient requires minimal assistance with transfers and able to ambulate  up to 200' using a rolling walker. Overall goals are set for  supervision at discharge.   *See Care Plan and progress notes for long and short-term goals.   Revisions to Treatment Plan:  N/a  Teaching Needs:  Safety, medications, toileting, transfers, foley care, etc   Current Barriers to Discharge: Decreased caregiver support  Possible Resolutions to Barriers: Family Education Out patient follow-up DME: W/C R/W, Shower chair and [facility]     Medical Summary Current Status: Medically complicated by craniotomy and hematoma EVAC for SDH, for SDH,  poor p.o. intakes, chronic large bowel distention/ileus, orthostatic hypotension, cognitive deficits,  paroxysmal A-fib,  Barriers to Discharge: Behavior/Mood;Cardiac Complications;Hypotension;Medical stability;Self-care education;Inadequate Nutritional Intake;Incontinence   Possible Resolutions to [employer] Focus: Monitor bowel movements with advancement of diet, remove staples/sutures when medically appropriate, support blood pressure with fluids and midodrine, adjust medications and provide cognitive stimulation to improve cognition, encourage p.o. intakes   Continued Need for Acute  Rehabilitation Level of Care: The patient requires daily medical management by a physician with specialized training in physical medicine and rehabilitation for the following reasons: Direction of a multidisciplinary physical rehabilitation program to maximize functional independence : Yes Medical management of patient stability for increased activity during participation in an intensive rehabilitation regime.: Yes Analysis of laboratory values and/or radiology reports with any subsequent need for medication adjustment and/or medical intervention. : Yes   I attest that I was present, lead the team conference, and concur with the assessment and plan of the team.   Jerene Monks 10/15/2023, 1004 am

## 2023-10-16 NOTE — Progress Notes (Signed)
 Physical Therapy TBI Note  Patient Details  Name: Jesse [name] MRN: [id number] Date of Birth: 03-05-1941  Today's Date: 10/16/2023 PT Individual Time: 0904-0959 PT Individual Time Calculation (min): 55 min   Short Term Goals: Week 1:  PT Short Term Goal 1 (Week 1): Pt will complete bed mobility with supervision PT Short Term Goal 2 (Week 1): Pt will complete bed to chair transfer with CGA PT Short Term Goal 3 (Week 1): Pt will ambulate x50' with minA and LRAD. PT Short Term Goal 4 (Week 1): Pt will complete balance assessment  Skilled Therapeutic Interventions/Progress Updates:     Pt received seated in WC and agrees to therapy, shaving face at sink at initiation of session. No complaint of pain. WC transport to gym. Pt performs stand step transfer from [facility] to Nustep with minA Rt HHA, with cues for initiation, posture, and positioning. Pt completes Nustep activity for endurance training and challenging attention to task. Pt completes x12:00 at workload of 5 with average steps per minute ~53. PT provides cues for hand and foot placement and completing full available ROM, ensuring that pt completes large movements to promote larger amplitude movement during gait. Following, pt completes standing activity to promote improved posture and body mechanics. Pt positioned in standing with cue to maintain contact between buttocks and shoulders with column posterior to pt. Pt utilizes RW for upper extremity support and PT provides verbal and tactile cues to optimize posture and provide over pressure for increased soft tissue lengthening in anterior trunk musculature.   Pt ambulates x175' with RW and CGA, with cues for increasing stride length and gait speed, as well as increasing upright posture to improve balance and gait mechanics.  WC transport back to room. Left seated with all needs within reach.   Therapy Documentation Precautions:  Precautions Precautions: Fall, Other (comment) Recall of  Precautions/Restrictions: Impaired Precaution/Restrictions Comments: L crani, Seizure, HOH, hx of dementia, orthostatic Restrictions Weight Bearing Restrictions Per Provider Order: No  Agitated Behavior Scale: TBI Observation Details Observation Environment: CIR Start of observation period - Date: 10/16/23 Start of observation period - Time: 0900 End of observation period - Date: 10/16/23 End of observation period - Time: 1000 Agitated Behavior Scale (DO NOT LEAVE BLANKS) Short attention span, easy distractibility, inability to concentrate: Present to a slight degree Impulsive, impatient, low tolerance for pain or frustration: Absent Uncooperative, resistant to care, demanding: Absent Violent and/or threatening violence toward people or property: Absent Explosive and/or unpredictable anger: Absent Rocking, rubbing, moaning, or other self-stimulating behavior: Absent Pulling at tubes, restraints, etc.: Absent Wandering from treatment areas: Absent Restlessness, pacing, excessive movement: Absent Repetitive behaviors, motor, and/or verbal: Absent Rapid, loud, or excessive talking: Absent Sudden changes of mood: Absent Easily initiated or excessive crying and/or laughter: Absent Self-abusiveness, physical and/or verbal: Absent Agitated behavior scale total score: 15    Therapy/Group: Individual Therapy  Neva Barban, PT, DPT 10/16/2023, 4:30 PM

## 2023-10-16 NOTE — Progress Notes (Signed)
 Speech Language Pathology TBI Note  Patient Details  Name: RUMEAL CULLIPHER MRN: [id number] Date of Birth: [birth year]  Today's Date: 10/16/2023 SLP Individual Time: 1030-1100 SLP Individual Time Calculation (min): 30 min  Today's Date: 10/16/2023 SLP Individual Time: 1430-1445 SLP Individual Time Calculation (min): 15 min  Short Term Goals: Week 1: SLP Short Term Goal 1 (Week 1): Patient will demonstrate simple problem solving skills during functional tasks given mod multimodal A SLP Short Term Goal 2 (Week 1): Patient will demonstrate carryover of basic daily information given mod multimodal A SLP Short Term Goal 3 (Week 1): Patient will demonstrate sustained attention during functional tasks for upwards of 10 minutes given mod multimodal A SLP Short Term Goal 4 (Week 1): Patient will recall current medical situations and 2 functional deficits given mod multimodal A  Skilled Therapeutic Interventions: 1030-1100: Pt and wife greeted at bedside. He appeared fatigued after PT tx session, but was agreeable to tx tasks targeting cognition. He was within one day during orientation review and utilized the calendar w/ minA cues to ID timeline of recent medical hx. With modA, he was able to recall 2 events of the morning thus far. During conversation re pt preferences and daily routine in the home environment, he required modA to initiate responses and sustain attention for ~8 mins. At the end of tx tasks, he was left in his chair with his wife present and call light within reach. Recommend cont ST.    1430: Attempted to see pt for tx tasks targeting cognition. However, pt reported that he was unable to participate d/t "not feeling well." He reported that he was extremely fatigued and freezing. He was even chattering his teeth momentarily, however, vitals and temp WNL. Despite education re POC and remaining cognitive deficits, he continued to refuse structured tx tasks. He was left in bed with the alarm set  and call light within reach. Missed 45 mins d/t pt fatigue.   Pain Pain Assessment Pain Scale: 0-10 Pain Score: 0-No pain  Agitated Behavior Scale: TBI Observation Details Observation Environment: CIR Start of observation period - Date: 10/16/23 Start of observation period - Time: 1030 End of observation period - Date: 10/16/23 End of observation period - Time: 1100 Agitated Behavior Scale (DO NOT LEAVE BLANKS) Short attention span, easy distractibility, inability to concentrate: Present to a slight degree Impulsive, impatient, low tolerance for pain or frustration: Absent Uncooperative, resistant to care, demanding: Absent Violent and/or threatening violence toward people or property: Absent Explosive and/or unpredictable anger: Absent Rocking, rubbing, moaning, or other self-stimulating behavior: Absent Pulling at tubes, restraints, etc.: Absent Wandering from treatment areas: Absent Restlessness, pacing, excessive movement: Absent Repetitive behaviors, motor, and/or verbal: Absent Rapid, loud, or excessive talking: Absent Sudden changes of mood: Absent Easily initiated or excessive crying and/or laughter: Absent Self-abusiveness, physical and/or verbal: Absent Agitated behavior scale total score: 15  Therapy/Group: Individual Therapy  [name] 10/16/2023, 11:43 AM

## 2023-10-16 NOTE — Progress Notes (Signed)
 Occupational Therapy Session Note  Patient Details  Name: EDREES VALENT MRN: [id number] Date of Birth: 1941-05-26  Today's Date: 10/16/2023 OT Missed Time: 60 Minutes Missed Time Reason: Patient fatigue   Short Term Goals: Week 1:  OT Short Term Goal 1 (Week 1): Pt will thread LB garments with Min A + LRAD. OT Short Term Goal 2 (Week 1): Pt will complete 1/3 toileting activities with CGA + LRAD. OT Short Term Goal 3 (Week 1): Pt will perform toilet transfer with CGA + LRAD.  Skilled Therapeutic Interventions/Progress Updates:    OT entered room and pt's wife was feeding him lunch. She requested for OT to come back later for pt to finish eating lunch. OT reentered room and pt now alone. He reported "I cannot move" but could not elaborate further. Vitals assessed: 103/62, HR 59 bpm. Attempted to initiate session and pt stating "no I am not doing anything" and reporting he is too cold to continue. He declined any attempt for intervention. 60 min missed.   Therapy Documentation Precautions:  Precautions Precautions: Fall, Other (comment) Recall of Precautions/Restrictions: Impaired Precaution/Restrictions Comments: L crani, Seizure, HOH, hx of dementia, orthostatic Restrictions Weight Bearing Restrictions Per Provider Order: No Therapy/Group: Individual Therapy  [name] 10/16/2023, 1:34 PM

## 2023-10-16 NOTE — Progress Notes (Signed)
 PROGRESS NOTE   Subjective/Complaints:  Patient hypotensive again this a.m., into the 90s over 50s.  Asymptomatic, is upright and eating breakfast.  Per wife, slept well through the night.  Feeling good, awake, alert today.  Having most of his therapy scheduled in the morning, hopeful this will avoid afternoon hypotension and lethargy.  Per therapy reports, patient did well this a.m., recommended 75 feet with a rolling walker with contact-guard assist.  Refused therapies in the afternoon due to feeling chilly and fatigued, vitals at that time were stable.  ROS: Limited due to cognitive/behavioral  + Chilliness + Fatigue + Orthostasis  Objective:   No results found.  Recent Labs    10/14/23 0640  WBC 6.0  HGB 9.7*  HCT 29.0*  PLT 220   Recent Labs    10/14/23 0640  NA 132*  K 4.1  CL 100  CO2 25  GLUCOSE 87  BUN 16  CREATININE 0.82  CALCIUM  8.2*    Intake/Output Summary (Last 24 hours) at 10/16/2023 2310 Last data filed at 10/16/2023 1834 Gross per 24 hour  Intake 1260 ml  Output 2350 ml  Net -1090 ml        Physical Exam: Vital Signs Blood pressure (!) 98/51, pulse (!) 53, temperature 98 F (36.7 C), resp. rate 17, height 5\' 11"  (1.803 m), weight 74.6 kg, SpO2 98%.  General: No acute distress, sitting upright in bedside chair eating breakfast. HEENT: left scalp with curvilinear incision with suture, clean and dry.   PERRLA, EOMI, sclera anicteric, oral mucosa pink and moist Neck: Supple without JVD or lymphadenopathy Heart: Reg rate and rhythm. No murmurs rubs or gallops Chest: CTA bilaterally without wheezes, rales, or rhonchi; no distress Abdomen: Minimally distended, hypoactive bowel sounds,, nontender to palpation.   Extremities: No clubbing, cyanosis, or edema. Pulses are 2+.  Wearing TED hose. Psych: Pt's affect is flat, appropriate mood. Skin: scalp as above.  Peripheral IV intact. Neuro:  patient awake and alert on exam.  Oriented x 3.  Fatigued with moderate memory deficits and mild concentration deficits--worse in the late afternoon. Cranial nerves II through XII intact Moving all 4 extremities 4 out of 4 antigravity against resistance Sensation intact   Musculoskeletal: Full ROM, No pain with AROM or PROM in the neck, trunk, or extremities. Posture appropriate     Assessment/Plan: 1. Functional deficits which require 3+ hours per day of interdisciplinary therapy in a comprehensive inpatient rehab setting. Physiatrist is providing close team supervision and 24 hour management of active medical problems listed below. Physiatrist and rehab team continue to assess barriers to discharge/monitor patient progress toward functional and medical goals  Care Tool:  Bathing    Body parts bathed by patient: Right arm, Left arm, Chest, Abdomen, Right upper leg, Left upper leg, Front perineal area, Face, Buttocks, Right lower leg, Left lower leg   Body parts bathed by helper: Buttocks, Right lower leg, Left lower leg     Bathing assist Assist Level: Minimal Assistance - Patient > 75%     Upper Body Dressing/Undressing Upper body dressing   What is the patient wearing?: Pull over shirt    Upper body assist Assist Level:  Supervision/Verbal cueing    Lower Body Dressing/Undressing Lower body dressing      What is the patient wearing?: Pants     Lower body assist Assist for lower body dressing: Moderate Assistance - Patient 50 - 74%     Toileting Toileting    Toileting assist Assist for toileting: Moderate Assistance - Patient 50 - 74%     Transfers Chair/bed transfer  Transfers assist     Chair/bed transfer assist level: Minimal Assistance - Patient > 75%     Locomotion Ambulation   Ambulation assist      Assist level: Minimal Assistance - Patient > 75% Assistive device: Walker-rolling Max distance: 175'   Walk 10 feet activity   Assist      Assist level: Minimal Assistance - Patient > 75% Assistive device: Walker-rolling   Walk 50 feet activity   Assist Walk 50 feet with 2 turns activity did not occur: Safety/medical concerns  Assist level: Minimal Assistance - Patient > 75% Assistive device: Walker-rolling    Walk 150 feet activity   Assist Walk 150 feet activity did not occur: Safety/medical concerns  Assist level: Minimal Assistance - Patient > 75% Assistive device: Walker-rolling    Walk 10 feet on uneven surface  activity   Assist Walk 10 feet on uneven surfaces activity did not occur: Safety/medical concerns         Wheelchair     Assist Is the patient using a wheelchair?: Yes Type of Wheelchair: Manual    Wheelchair assist level: Dependent - Patient 0% Max wheelchair distance: 150'    Wheelchair 50 feet with 2 turns activity    Assist        Assist Level: Dependent - Patient 0%   Wheelchair 150 feet activity     Assist      Assist Level: Dependent - Patient 0%   Blood pressure (!) 98/51, pulse (!) 53, temperature 98 F (36.7 C), resp. rate 17, height 5\' 11"  (1.803 m), weight 74.6 kg, SpO2 98%.  Medical Problem List and Plan: 1. Functional deficits secondary to traumatic SDH s/p crani/evacuation x2  -pt also with meningioma s/p resection             -patient may  shower -cover incision/crani             -ELOS/Goals: 9-12 days min A-- 5/27 DC date -Continue CIR PT, OT, and SLP 5-19: Per discharge summary, Decadron  taper was to be stopped; will initiate slow taper over the next 8 days given ongoing blood pressure issues. 5/20: Wife receptive to education. Min-Mod LBD, Mod for ambulation with orthostatics. May downgrade to Min A goals. Per wife ambulation looking pretty close to normal. CGA for transfers and up to 200 ft ambulation with a walker. Mod A cognition.   2.  Antithrombotics: -DVT/anticoagulation:  Pharmaceutical: Lovenox  added on 05/15.              -antiplatelet therapy: N/A 3. Pain Management:  Oxycodone  prn. Pt denies pain 4. Mood/Behavior/Sleep: LCSW to follow for evaluation and support.              -antipsychotic agents: N/A  -pt very reliant on wife for emotional support, decision-making   -  5/20: Sleep log added per request--slept well per wife.  5. Neuropsych/cognition: This patient may not be fully capable of making decisions on his own behalf.  5/20: Per wife will have intermittent muteness at baseline; ?underlying dementia vs. Mood/behavioral  6. Skin/Wound Care: Routine pressure relief  Mesures.   -continue scalp sutures for now-- will be 10 days post-op 5/19.   7. Fluids/Electrolytes/Nutrition: Monitor I/O. Check CMET in a.              --monitor intake--did not want to eat "wife will feed me"  -10/12/23 BMP stabe, Cr 0.68  -5-19: BUN/creatinine stable, hyponatremia improved  8. Vasovagal syncope/post ictal aphasia?: On keppra  750 mg BID per Dr. Arora --monitor for orthostatic changes. stopped Flomax  per wife request  -5/16- spoke with wife who has concerns that the increased keppra  dose has affected his mentation. I contacted Dr. Bonnita Buttner who felt that we could go back to the 500mg  bid dose for now and observe.   5/18 discussed prior decrease decrease Keppra  dose 500 mg twice daily with wife  5-19:  Wife requesting EEG or reduction of Keppra ; given no acute concern for seizures, do not feel EEG would be beneficial at this time.  Will discuss with Dr. Arora further dose reduction before initiating.  5-20: Neurology reducing Keppra  to 250 mg twice daily dose per wife's request, after discussion of risks versus benefits.  9. Abdominal distension:   had BM yesterday --Hx of sigmoid volvulus 09/2017.   5/16-colonic ileus on KUB yesterday. Don't need another KUB this morning as exam is unchanged--recheck KUB tomorrow -CMET pending -clears only -IVF, IV reglan  -SSE today -(may have to wait for wife to be present to begin these  interventions) -5/17 + medium BM today, reports appetite is a little improving, continue current diet, Reglan  and reassess tomorrow -soap suds enema today 5/18 soft diet started, patient's wife feels like Reglan  is over sedating him.  He is tolerating soft diet today.    Discussed with GI Reglan  would not likely provide much benefit for colonic ileus, will discontinue this.   Clinically doing much better. -GI recommended considering Linzess to 290 mcg daily, consider enema tomorrow if not improving.  Wife would like him not to have Linzess, reports his colon is chronically distended due to prior surgery.  She said he always has bowel movements in the evening after drinking water.  Will hold off on Linzess today consider if not improving. 5-19: Producing small soft, soft bowel movements approximately twice daily.  Tolerating p.o. diet.  Continue current regimen, encourage p.o. fluids.  Per wife, despite ongoing colonic distention and large stool burden on x-ray, wishes to not escalate bowel regimen further and requests no further imaging, no GI input/consultation. 5-20: Large bowel movement this afternoon per wife  10. Urinary retention: Hx of Prostate CA s/p prostatectomy/XRT --Will d/c flomax  per multiple wife's request.  --Continue finasteride  which was added on 05/13 -5/14-can continue foley until ileus is resolved 5/18 wife indicates that she would like to continue this for now and follow-up with urology outpatient--informed nursing of this  11. Hyponatremia: Monitor for now.  -5/17 stable at 131  Stable to slightly improved 132 5-19  12. PAF: Monitor HR TID--on amiodarone.  --Not on [facility] per patient, chart review and pharmacy review. -5/17-18 HR stable monitor     10/16/2023    8:50 PM 10/16/2023    1:28 PM 10/16/2023    5:56 AM  Vitals with BMI  Systolic 98 105 99  Diastolic 51 54 62  Pulse 53 61 64    13. Acute blood loss anemia:    -5/16 hgb stable at 10.1 today\  Recheck  tomorrow--stable, 9.7  14. H/o LBP radiating to LLE/Neurogenic claudication: Monitor for symptoms with increase in activity.  15.  Hypotension.  - 5-19: Encourage p.o. fluids, added TED hose.  Will avoid binder due to gastrointestinal issues as above.  Later, received IV fluid 1 L for SBP 70s.;  Responsive.  Monitor tomorrow with therapies, may need addition of midodrine.  5-20: Remains with severe orthostatic hypotension after IV fluids yesterday, although with some improvement--per therapies will recover quickly once he is walking.  Add midodrine 2.5 mg twice daily with meals.  Check a.m. cortisol levels once off of Decadron .  5-21: Continues to refuse a.m. orthostats, did well with therapies this a.m. but refused afternoon sessions due to feelings of fatigue.  Given consistent complaints in the afternoon, will increase midodrine to 3 times daily versus initiate overnight maintenance fluids for blood pressure support; will discuss with wife in a.m.  LOS: 6 days A FACE TO FACE EVALUATION WAS PERFORMED  Bea Lime 10/16/2023, 11:10 PM

## 2023-10-17 LAB — BASIC METABOLIC PANEL WITH GFR
Anion gap: 5 (ref 5–15)
BUN: 20 mg/dL (ref 8–23)
CO2: 24 mmol/L (ref 22–32)
Calcium: 8.4 mg/dL — ABNORMAL LOW (ref 8.9–10.3)
Chloride: 101 mmol/L (ref 98–111)
Creatinine, Ser: 0.76 mg/dL (ref 0.61–1.24)
GFR, Estimated: >60 mL/min (ref >60)
Glucose, Bld: 97 mg/dL (ref 70–99)
Potassium: 4.3 mmol/L (ref 3.5–5.1)
Sodium: 130 mmol/L — ABNORMAL LOW (ref 135–145)

## 2023-10-17 LAB — CBC
HCT: 30.2 % — ABNORMAL LOW (ref 39.0–52.0)
Hemoglobin: 10.2 g/dL — ABNORMAL LOW (ref 13.0–17.0)
MCH: 30.3 pg (ref 26.0–34.0)
MCHC: 33.8 g/dL (ref 30.0–36.0)
MCV: 89.6 fL (ref 80.0–100.0)
Platelets: 231 10*3/uL (ref 150–400)
RBC: 3.37 MIL/uL — ABNORMAL LOW (ref 4.22–5.81)
RDW: 14 % (ref 11.5–15.5)
WBC: 8.8 10*3/uL (ref 4.0–10.5)
nRBC: 0 % (ref 0.0–0.2)

## 2023-10-17 LAB — OSMOLALITY: Osmolality: 285 mosm/kg (ref 275–295)

## 2023-10-17 LAB — OSMOLALITY, URINE: Osmolality, Ur: 761 mosm/kg (ref 300–900)

## 2023-10-17 LAB — SODIUM, URINE, RANDOM: Sodium, Ur: 19 mmol/L

## 2023-10-17 MED ORDER — SODIUM CHLORIDE 0.9 % IV SOLN: Freq: Every day | INTRAVENOUS | Status: DC

## 2023-10-17 MED ORDER — MIDODRINE HCL 5 MG PO TABS
2.5000 mg | ORAL_TABLET | Freq: Three times a day (TID) | ORAL | Status: DC
  Administered 2023-10-17 – 2023-10-18 (×3): 2.5 mg via ORAL
  Filled 2023-10-17 (×4): qty 1

## 2023-10-17 NOTE — Progress Notes (Signed)
 Occupational Therapy Session Note  Patient Details  Name: Jesse [name] MRN: [id number] Date of Birth: Sep 29, 1940  Today's Date: 10/17/2023 OT Individual Time: 4098-1191 OT Individual Time Calculation (min): 43 min    Short Term Goals: Week 1:  OT Short Term Goal 1 (Week 1): Pt will thread LB garments with Min A + LRAD. OT Short Term Goal 2 (Week 1): Pt will complete 1/3 toileting activities with CGA + LRAD. OT Short Term Goal 3 (Week 1): Pt will perform toilet transfer with CGA + LRAD.  Skilled Therapeutic Interventions/Progress Updates:    Patient received sleeping in recliner chair with feet elevated and propped.  Patient slow to fully wake, and flat affect, but pleasant, and agreed to OT session.  Patient bathed and dressed already per wife.  Patient agrees to take a short walk to Dayroom with RW.  Patient requires cueing with RW for upright posture, and step length while walking.  Patient answers direct questions - although looks toward wife first, and she often answers for him.  Wife stayed in background during part of this session, then patient more verbally interactive.   Had patient complete a sorting task in busy environment.  Patient able to sort cards numerically, and by suit with moderate cueing initially, then min assist.  Patient with limited ability to verbalize deficits, nor infer why he would be asked to complete such a task.  Patient returned to room, and left up in wheelchair with wife beside him.     Therapy Documentation Precautions:  Precautions Precautions: Fall, Other (comment) Recall of Precautions/Restrictions: Impaired Precaution/Restrictions Comments: L crani, Seizure, HOH, hx of dementia, orthostatic Restrictions Weight Bearing Restrictions Per Provider Order: No  Pain:  Denies pain     Therapy/Group: Individual Therapy  Tamaira Ciriello M 10/17/2023, 3:56 PM

## 2023-10-17 NOTE — Progress Notes (Signed)
 PROGRESS NOTE   Subjective/Complaints: BP remains low but relatively stable, heart rate intermittently into the 50s but overall staying in a normal range.  Again, refusing orthostatic vitals. Sodium down to 130 today.  Hemoglobin up to 10.  Otherwise labs are stable. Fluid intakes good yesterday, 1.2 L.  Still remains net negative.  Patient states he is doing well today, no complaints or concerns.  Per wife, Dr. Ellery Guthrie to come in today and remove sutures and evaluate surgical site.  He is doing well with keeping up his blood pressure, had a bowel movement earlier today, large.  No concerns, complaints.  ROS: Limited due to cognitive/behavioral   + Fatigue--consistently later in the day + Orthostasis--stable  Objective:   No results found.  Recent Labs    10/17/23 0649  WBC 8.8  HGB 10.2*  HCT 30.2*  PLT 231   Recent Labs    10/17/23 0649  NA 130*  K 4.3  CL 101  CO2 24  GLUCOSE 97  BUN 20  CREATININE 0.76  CALCIUM  8.4*    Intake/Output Summary (Last 24 hours) at 10/17/2023 1014 Last data filed at 10/17/2023 0607 Gross per 24 hour  Intake 900 ml  Output 2225 ml  Net -1325 ml        Physical Exam: Vital Signs Blood pressure 99/65, pulse 64, temperature 98.1 F (36.7 C), resp. rate 17, height 5\' 11"  (1.803 m), weight 74.6 kg, SpO2 97%.  General: No acute distress, sitting upright in bedside chair watching television. HEENT: left scalp with curvilinear incision with suture, clean and dry.   PERRLA, EOMI, sclera anicteric, oral mucosa pink and moist Neck: Supple without JVD or lymphadenopathy Heart: Reg rate and rhythm. No murmurs rubs or gallops Chest: CTA bilaterally without wheezes, rales, or rhonchi; no distress Abdomen: Minimally distended, hypoactive bowel sounds,, nontender to palpation.   Extremities: No clubbing, cyanosis, or edema. Pulses are 2+.  Wearing TED hose. Psych: Pt's affect is flat,  appropriate mood. Skin: scalp as above.  Peripheral IV intact. Neuro: patient awake and alert on exam.  Oriented x 3.  moderate memory deficits and mild concentration deficits--worse in the late afternoon. Cranial nerves II through XII intact Moving all 4 extremities 4 out of 4 antigravity against resistance Sensation intact   Musculoskeletal: Full ROM, No pain with AROM or PROM in the neck, trunk, or extremities. Posture appropriate   No significant changes 5-22  Assessment/Plan: 1. Functional deficits which require 3+ hours per day of interdisciplinary therapy in a comprehensive inpatient rehab setting. Physiatrist is providing close team supervision and 24 hour management of active medical problems listed below. Physiatrist and rehab team continue to assess barriers to discharge/monitor patient progress toward functional and medical goals  Care Tool:  Bathing    Body parts bathed by patient: Right arm, Left arm, Chest, Abdomen, Right upper leg, Left upper leg, Front perineal area, Face, Buttocks, Right lower leg, Left lower leg   Body parts bathed by helper: Buttocks, Right lower leg, Left lower leg     Bathing assist Assist Level: Minimal Assistance - Patient > 75%     Upper Body Dressing/Undressing Upper body dressing   What  is the patient wearing?: Pull over shirt    Upper body assist Assist Level: Supervision/Verbal cueing    Lower Body Dressing/Undressing Lower body dressing      What is the patient wearing?: Pants     Lower body assist Assist for lower body dressing: Moderate Assistance - Patient 50 - 74%     Toileting Toileting    Toileting assist Assist for toileting: Moderate Assistance - Patient 50 - 74%     Transfers Chair/bed transfer  Transfers assist     Chair/bed transfer assist level: Contact Guard/Touching assist     Locomotion Ambulation   Ambulation assist      Assist level: Contact Guard/Touching assist Assistive device:  Walker-rolling Max distance: 150   Walk 10 feet activity   Assist     Assist level: Contact Guard/Touching assist Assistive device: Walker-rolling   Walk 50 feet activity   Assist Walk 50 feet with 2 turns activity did not occur: Safety/medical concerns  Assist level: Contact Guard/Touching assist Assistive device: Walker-rolling    Walk 150 feet activity   Assist Walk 150 feet activity did not occur: Safety/medical concerns  Assist level: Contact Guard/Touching assist Assistive device: Walker-rolling    Walk 10 feet on uneven surface  activity   Assist Walk 10 feet on uneven surfaces activity did not occur: Safety/medical concerns         Wheelchair     Assist Is the patient using a wheelchair?: Yes Type of Wheelchair: Manual    Wheelchair assist level: Dependent - Patient 0% Max wheelchair distance: 150'    Wheelchair 50 feet with 2 turns activity    Assist        Assist Level: Dependent - Patient 0%   Wheelchair 150 feet activity     Assist      Assist Level: Dependent - Patient 0%   Blood pressure 99/65, pulse 64, temperature 98.1 F (36.7 C), resp. rate 17, height 5\' 11"  (1.803 m), weight 74.6 kg, SpO2 97%.  Medical Problem List and Plan: 1. Functional deficits secondary to traumatic SDH s/p crani/evacuation x2  -pt also with meningioma s/p resection             -patient may  shower -cover incision/crani             -ELOS/Goals: 9-12 days min A-- 5/27 DC date -Continue CIR PT, OT, and SLP 5-19: Per discharge summary, Decadron  taper was to be stopped; will initiate slow taper over the next 8 days given ongoing blood pressure issues. 5/20: Wife receptive to education. Min-Mod LBD, Mod for ambulation with orthostatics. May downgrade to Min A goals. Per wife ambulation looking pretty close to normal. CGA for transfers and up to 200 ft ambulation with a walker. Mod A cognition.   2.  Antithrombotics: -DVT/anticoagulation:   Pharmaceutical: Lovenox  added on 05/15.             -antiplatelet therapy: N/A 3. Pain Management:  Oxycodone  prn. Pt denies pain 4. Mood/Behavior/Sleep: LCSW to follow for evaluation and support.              -antipsychotic agents: N/A  -pt very reliant on wife for emotional support, decision-making   -  5/20: Sleep log added per request--sleeping well.  5. Neuropsych/cognition: This patient may not be fully capable of making decisions on his own behalf.  5/20: Per wife will have intermittent muteness at baseline; ?underlying dementia vs. Mood/behavioral  6. Skin/Wound Care: Routine pressure relief Mesures.    -  5/22: Per family, Dr. Ellery Guthrie coming in for wound evaluation and suture removal today. Since 12 days post-op, will remove tomorrow if not.   7. Fluids/Electrolytes/Nutrition: Monitor I/O. Check CMET in a.              --monitor intake--did not want to eat "wife will feed me"  -10/12/23 BMP stabe, Cr 0.68  -5-19: BUN/creatinine stable, hyponatremia improved  .  8. Vasovagal syncope/post ictal aphasia?: On keppra  750 mg BID per Dr. Arora --monitor for orthostatic changes. stopped Flomax  per wife request  -5/16- spoke with wife who has concerns that the increased keppra  dose has affected his mentation. I contacted Dr. Bonnita Buttner who felt that we could go back to the 500mg  bid dose for now and observe.   5/18 discussed prior decrease decrease Keppra  dose 500 mg twice daily with wife  5-19:  Wife requesting EEG or reduction of Keppra ; given no acute concern for seizures, do not feel EEG would be beneficial at this time.  Will discuss with Dr. Arora further dose reduction before initiating.  5-20: Neurology reducing Keppra  to 250 mg twice daily dose per wife's request, after discussion of risks versus benefits.  9. Abdominal distension:   had BM yesterday --Hx of sigmoid volvulus 09/2017.   5/16-colonic ileus on KUB yesterday. Don't need another KUB this morning as exam is unchanged--recheck  KUB tomorrow -CMET pending -clears only -IVF, IV reglan  -SSE today -(may have to wait for wife to be present to begin these interventions) -5/17 + medium BM today, reports appetite is a little improving, continue current diet, Reglan  and reassess tomorrow -soap suds enema today 5/18 soft diet started, patient's wife feels like Reglan  is over sedating him.  He is tolerating soft diet today.    Discussed with GI Reglan  would not likely provide much benefit for colonic ileus, will discontinue this.   Clinically doing much better. -GI recommended considering Linzess to 290 mcg daily, consider enema tomorrow if not improving.  Wife would like him not to have Linzess, reports his colon is chronically distended due to prior surgery.  She said he always has bowel movements in the evening after drinking water.  Will hold off on Linzess today consider if not improving. 5-19: Producing small soft, soft bowel movements approximately twice daily.  Tolerating p.o. diet.  Continue current regimen, encourage p.o. fluids.  Per wife, despite ongoing colonic distention and large stool burden on x-ray, wishes to not escalate bowel regimen further and requests no further imaging, no GI input/consultation. 5-20: Large bowel movement this afternoon per wife; last bowel movement that afternoon, medium. 5/22: LBM  10. Urinary retention: Hx of Prostate CA s/p prostatectomy/XRT --Will d/c flomax  per multiple wife's request.  --Continue finasteride  which was added on 05/13 -5/14-can continue foley until ileus is resolved 5/18 wife indicates that she would like to continue this for now and follow-up with urology outpatient--informed nursing of this  11. Hyponatremia/SIADH: Monitor for now.  -5/17 stable at 131  Stable to slightly improved 132 5-19  5-22: Hyponatremia down to 130 today.  Will get serum osmolality, urine sodium, urine osmolality to assess.--Indicate mild SIADH, will initiate fluid restriction and overnight  normal saline for blood pressure support and sodium supplementation.  Complicated by weaning of glucocorticoids as above.  12. PAF: Monitor HR TID--on amiodarone.  --Not on [facility] per patient, chart review and pharmacy review. -5/17-18 HR stable monitor     10/17/2023    5:57 AM 10/16/2023    8:50 PM  10/16/2023    1:28 PM  Vitals with BMI  Systolic 99 98 105  Diastolic 65 51 54  Pulse 64 53 61    13. Acute blood loss anemia:    -5/16 hgb stable at 10.1 today\  Recheck tomorrow--stable, 9.7  14. H/o LBP radiating to LLE/Neurogenic claudication: Monitor for symptoms with increase in activity.   15.  Hypotension.  - 5-19: Encourage p.o. fluids, added TED hose.  Will avoid binder due to gastrointestinal issues as above.  Later, received IV fluid 1 L for SBP 70s.;  Responsive.  Monitor tomorrow with therapies, may need addition of midodrine.  5-20: Remains with severe orthostatic hypotension after IV fluids yesterday, although with some improvement--per therapies will recover quickly once he is walking.  Add midodrine 2.5 mg twice daily with meals.  Check a.m. cortisol levels once off of Decadron .  5-21: Continues to refuse a.m. orthostats, did well with therapies this a.m. but refused afternoon sessions due to feelings of fatigue.  Given consistent complaints in the afternoon, will increase midodrine to 3 times daily versus initiate overnight maintenance fluids for blood pressure support; will discuss with wife in a.m.  5/22: Continues to be more fatigued/refusing therapies in the evening.  Blood pressure remains stable but very soft.  Will increase midodrine to 2.5 mg 3 times daily.  If fluid restriction required for SIADH based on labs, may need to do normal saline infusions overnight to support BP--will start 5/22  LOS: 7 days A FACE TO FACE EVALUATION WAS PERFORMED  Bea Lime 10/17/2023, 10:14 AM

## 2023-10-17 NOTE — Progress Notes (Signed)
 Physical Therapy Session Note  Patient Details  Name: Jesse [name] MRN: [id number] Date of Birth: 06/16/1940  Today's Date: 10/17/2023 PT Individual Time: 0915-1000 PT Individual Time Calculation (min): 45 min   Short Term Goals: Week 1:  PT Short Term Goal 1 (Week 1): Pt will complete bed mobility with supervision PT Short Term Goal 2 (Week 1): Pt will complete bed to chair transfer with CGA PT Short Term Goal 3 (Week 1): Pt will ambulate x50' with minA and LRAD. PT Short Term Goal 4 (Week 1): Pt will complete balance assessment  Skilled Therapeutic Interventions/Progress Updates:   First session:  Pt presents semi-reclined in bed finishing breakfast and spouse and pt agreeable to therapy.  Spouse initiating dressing pt and threads pants over feet.  Pt transfers sup to sit w/ CGA and cues to scoot to EOB.  Spouse again initiating donning shirt before PT directs pt to doff gown and then mod A to place over L UE 2/2 IV, CGA for RLE.  Pt amb x 90' to dayroom w/ CGA but frequent cues for posture, foot clearance, step length and BOS.  Pt performed 2 x 5 sit to stand w/ min A and no UE support.  Pt required seated rest breaks between trials.  Pt then performed 2 x 3 sit to stand on [employer] cushion w/ cues for breathing techniques.  Pt amb to room and remained sitting in w/c w/ all needs in reach, spouse present throughout.  BP as below:  Supine: 104/58 HR 72 Sitting:   105/65 HR 77 After transfer to w/c : 114/66 HR 72 After gait : 118/71 HR 74  Second session:Pt presents sitting in recliner and agreeable to therapy.  Pt transfers sit to stand w/ CGA.  Pt amb x 100' to dayroom w/ cues for posture and step length.  Pt amb to Nu-step for paced program x 8' at level 4 then decreased to level 2 w/ cues for increased ROM B LES.  Pt set for 55 rpm and then increased to 80 spm for total steps of 440.  Pt amb x 170 w/ RW  to room and to bed.  Pt doffed shoes and then transferred sit to supine w/ CGA.  Pt  able to bridge to center and pull to [facility] using headboard.  Bed alarm on and all needs in reach, spouse present.     Therapy Documentation Precautions:  Precautions Precautions: Fall, Other (comment) Recall of Precautions/Restrictions: Impaired Precaution/Restrictions Comments: L crani, Seizure, HOH, hx of dementia, orthostatic Restrictions Weight Bearing Restrictions Per Provider Order: No General:   Vital Signs:   Pain:   Mobility:   Locomotion :    Trunk/Postural Assessment :    Balance:   Exercises:   Other Treatments:      Therapy/Group: Individual Therapy  Kathy Wares P Blessyn [place] 10/17/2023, 10:04 AM

## 2023-10-17 NOTE — Progress Notes (Signed)
 Speech Language Pathology Daily Session Note  Patient Details  Name: Jesse [name] MRN: [id number] Date of Birth: 09-Dec-1940  Today's Date: 10/17/2023 SLP Individual Time: 0800-0900 SLP Individual Time Calculation (min): 60 min  Short Term Goals: Week 1: SLP Short Term Goal 1 (Week 1): Patient will demonstrate simple problem solving skills during functional tasks given mod multimodal A SLP Short Term Goal 2 (Week 1): Patient will demonstrate carryover of basic daily information given mod multimodal A SLP Short Term Goal 3 (Week 1): Patient will demonstrate sustained attention during functional tasks for upwards of 10 minutes given mod multimodal A SLP Short Term Goal 4 (Week 1): Patient will recall current medical situations and 2 functional deficits given mod multimodal A  Skilled Therapeutic Interventions:   Pt greeted at bedside and his wife arrived shortly after. SLP facilitated tx tasks targeting cognition and speech production. He was able to spontaneously recall that he had not eaten breakfast yet and benefited from minA cues to utilize the calendar and provide today's orientation information. His nurse arrived with morning meds between tasks. Of note, he was able to swallow his meds whole w/o any difficulty, but continues to request meds crushed in puree. Pt was agreeable to swallowing meds whole and crushing only larger pills from this date forward. He then completed a verbal organization task targeting attention, working memory, and problem solving. He was able to provide 2 definitions of a word w/ minA for reasoning and mental flexibility. SLP also introduced EMST via EMST75 to target improved vocal intensity and endurance during conversation given recent discussion w/ pt and wife re this problem. With modA to maintain technique, he was able to complete 10 reps @ 15 cmH2O. Pt continues to present w/ reduced motivation and (anticipated) learned helplessness, but does respond well to  encouragement from his wife. Additionally, his wife remains receptive to education from SLP. Upon completion of tx tasks, he was left in bed with his wife present. Recommend cont ST.    Pain  No pain reported  Therapy/Group: Individual Therapy  [name] 10/17/2023, 8:33 AM

## 2023-10-17 NOTE — Progress Notes (Signed)
 [name] refuses Midodrine dose at 1800 due to concerns of elevated blood pressure in the PM.  Jesse [name]  H Eloy Half

## 2023-10-18 ENCOUNTER — Other Ambulatory Visit (HOSPITAL_COMMUNITY): Payer: Self-pay

## 2023-10-18 LAB — URINALYSIS, ROUTINE W REFLEX MICROSCOPIC
Bilirubin Urine: NEGATIVE
Glucose, UA: NEGATIVE mg/dL
Ketones, ur: NEGATIVE mg/dL
Nitrite: NEGATIVE
Protein, ur: 30 mg/dL — AB
Specific Gravity, Urine: 1.014 (ref 1.005–1.030)
WBC, UA: >50 WBC/hpf (ref 0–5)
pH: 6 (ref 5.0–8.0)

## 2023-10-18 LAB — BASIC METABOLIC PANEL WITH GFR
Anion gap: 7 (ref 5–15)
BUN: 21 mg/dL (ref 8–23)
CO2: 24 mmol/L (ref 22–32)
Calcium: 8.1 mg/dL — ABNORMAL LOW (ref 8.9–10.3)
Chloride: 101 mmol/L (ref 98–111)
Creatinine, Ser: 0.75 mg/dL (ref 0.61–1.24)
GFR, Estimated: >60 mL/min (ref >60)
Glucose, Bld: 98 mg/dL (ref 70–99)
Potassium: 4.3 mmol/L (ref 3.5–5.1)
Sodium: 132 mmol/L — ABNORMAL LOW (ref 135–145)

## 2023-10-18 MED ORDER — SULFAMETHOXAZOLE-TRIMETHOPRIM 800-160 MG PO TABS
1.0000 | ORAL_TABLET | Freq: Two times a day (BID) | ORAL | Status: DC
  Administered 2023-10-18 – 2023-10-20 (×5): 1 via ORAL
  Filled 2023-10-18 (×6): qty 1

## 2023-10-18 MED ORDER — CALCIUM CARBONATE ANTACID 500 MG PO CHEW
800.0000 mg | CHEWABLE_TABLET | Freq: Every day | ORAL | 0 refills | Status: AC
  Filled 2023-10-18: qty 30, 7d supply, fill #0

## 2023-10-18 MED ORDER — POLYETHYLENE GLYCOL 3350 17 G PO PACK: 17.0000 g | PACK | Freq: Every day | ORAL | Status: DC

## 2023-10-18 MED ORDER — ACETAMINOPHEN 325 MG PO TABS: 650.0000 mg | ORAL_TABLET | Freq: Four times a day (QID) | ORAL | Status: DC | PRN

## 2023-10-18 MED ORDER — FLAVOXATE HCL 100 MG PO TABS: 100.0000 mg | ORAL_TABLET | Freq: Three times a day (TID) | ORAL | Status: DC | PRN

## 2023-10-18 NOTE — Discharge Instructions (Signed)
 Inpatient Rehab Discharge Instructions  TYREECE GELLES Discharge date and time:  10/22/23  Activities/Precautions/ Functional Status: Activity: no lifting, driving, or strenuous exercise till cleared by MD Diet: soft  diet Limit fluids to 1200 cc per day/5 cups Wound Care: keep wound clean and dry. Contact Dr. Ellery Guthrie if you develop any problems with your incision/wound--redness, swelling, increase in pain, drainage or if you develop fever or chills.    Functional status:  ___ No restrictions     ___ Walk up steps independently _X__ 24/7 supervision/assistance   ___ Walk up steps with assistance ___ Intermittent supervision/assistance  ___ Bathe/dress independently ___ Walk with walker     _X__ Bathe/dress with assistance ___ Walk Independently    ___ Shower independently ___ Walk with assistance    ___ Shower with assistance _X__ No alcohol     ___ Return to work/school ________    Special Instructions:    My questions have been answered and I understand these instructions. I will adhere to these goals and the provided educational materials after my discharge from the hospital.  Patient/Caregiver Signature _______________________________ Date __________  Clinician Signature _______________________________________ Date __________  Please bring this form and your medication list with you to all your follow-up doctor's appointments.

## 2023-10-18 NOTE — Plan of Care (Signed)
  Problem: Consults Goal: RH BRAIN INJURY PATIENT EDUCATION Description: Description: See Patient Education module for eduction specifics Outcome: Progressing   Problem: RH BOWEL ELIMINATION Goal: RH STG MANAGE BOWEL WITH ASSISTANCE Description: STG Manage Bowel with supervision Assistance. Outcome: Progressing   Problem: RH BLADDER ELIMINATION Goal: RH STG MANAGE BLADDER WITH ASSISTANCE Description: STG Manage Bladder With supervision Assistance Outcome: Progressing   Problem: RH SKIN INTEGRITY Goal: RH STG SKIN FREE OF INFECTION/BREAKDOWN Description: Manage skin free of infection/breakdown with supervision Outcome: Progressing   Problem: RH SAFETY Goal: RH STG ADHERE TO SAFETY PRECAUTIONS W/ASSISTANCE/DEVICE Description: STG Adhere to Safety Precautions With  supervision Assistance/Device. Outcome: Progressing   Problem: RH COGNITION-NURSING Goal: RH STG USES MEMORY AIDS/STRATEGIES W/ASSIST TO PROBLEM SOLVE Description: STG Uses Memory Aids/Strategies With supervision Assistance to Problem Solve. Outcome: Progressing   Problem: RH PAIN MANAGEMENT Goal: RH STG PAIN MANAGED AT OR BELOW PT'S PAIN GOAL Description: <4 w/ prns Outcome: Progressing   Problem: RH KNOWLEDGE DEFICIT BRAIN INJURY Goal: RH STG INCREASE KNOWLEDGE OF SELF CARE AFTER BRAIN INJURY Description: Manage increase knowledge of self care after brain injury with supervision assistance from wife using educational materials provided Outcome: Progressing

## 2023-10-18 NOTE — Plan of Care (Signed)
 Downgraded to reflect baseline  Problem: RH Dressing Goal: LTG Patient will perform lower body dressing w/assist (OT) Description: LTG: Patient will perform lower body dressing with assist, with/without cues in positioning using equipment (OT) Flowsheets (Taken 10/18/2023 0940) LTG: Pt will perform lower body dressing with assistance level of: (downgraded to reflect baseline- SD) Minimal Assistance - Patient > 75%

## 2023-10-18 NOTE — Plan of Care (Signed)
  Problem: RH Memory Goal: LTG Patient will use memory compensatory aids to (SLP) Description: LTG:  Patient will use memory compensatory aids to recall biographical/new, daily complex information with cues (SLP) Flowsheets (Taken 10/18/2023 1645) LTG: Patient will use memory compensatory aids to (SLP): Moderate Assistance - Patient 50 - 74%   Problem: RH Awareness Goal: LTG: Patient will demonstrate awareness during functional activites type of (SLP) Description: LTG: Patient will demonstrate awareness during functional activites type of (SLP) Flowsheets (Taken 10/18/2023 1645) LTG: Patient will demonstrate awareness during cognitive/linguistic activities with assistance of (SLP): Maximal Assistance - Patient 25 - 49%   Problem: RH Pre-functional/Other (Specify) Goal: RH LTG SLP (Specify) 1 Description: RH LTG SLP (Specify) 1 Flowsheets (Taken 10/18/2023 1644) LTG: Other SLP (Specify) 1: Pt will complete EMST protocol w/ minA to facilitate improved vocal intensity

## 2023-10-18 NOTE — Progress Notes (Signed)
 Occupational Therapy Weekly Progress Note  Patient Details  Name: Jesse [name] MRN: [id number] Date of Birth: 1941/05/18  Beginning of progress report period: Oct 11, 2023 End of progress report period: Oct 18, 2023  Today's Date: 10/18/2023 OT Individual Time: 7829-5621 OT Individual Time Calculation (min): 74 min    Patient has met 1 of 3 short term goals. Jesse "Lavonia Powers" has made steady progress toward his OT goals. He is able to complete UB ADLs at a supervision level and min-mod A with LB clothing. His transfers have improved significantly and are CGA, approaching (S) for stand pivots and short distance mobility. His wife is incredibly supportive and is actively involved in therapy sessions daily.   Patient continues to demonstrate the following deficits: muscle weakness, decreased cardiorespiratoy endurance, decreased initiation, decreased attention, decreased awareness, decreased problem solving, decreased safety awareness, decreased memory, and delayed processing, and decreased standing balance and decreased balance strategies and therefore will continue to benefit from skilled OT intervention to enhance overall performance with BADL and Reduce care partner burden.  Patient progressing toward long term goals..  Continue plan of care.  OT Short Term Goals Week 1:  OT Short Term Goal 1 (Week 1): Pt will thread LB garments with Min A + LRAD. OT Short Term Goal 1 - Progress (Week 1): Progressing toward goal OT Short Term Goal 2 (Week 1): Pt will complete 1/3 toileting activities with CGA + LRAD. OT Short Term Goal 2 - Progress (Week 1): Progressing toward goal OT Short Term Goal 3 (Week 1): Pt will perform toilet transfer with CGA + LRAD. OT Short Term Goal 3 - Progress (Week 1): Met Week 2:  OT Short Term Goal 1 (Week 2): STG=LTG d/t ELOS  Skilled Therapeutic Interventions/Progress Updates:    Pt received supine with no c/o pain, agreeable to OT session.  Wife present. Skilled  monitoring of vitals throughout session to ensure hemodynamic and cardiorespiratory stability- BP's listed below. He came to EOB with (S), improved power up. He completed sit > stand with cueing for hand placement on RW/bed. CGA for stand. Short distance functional mobility to the sink. He sat to take morning medication from LPN. He stood for oral care- sit > stand with (S), and CGA for standing balance d/t gradual sinking into flexed knees. He was able to complete UB bathing/dressing seated with (S). He completed LB dressing with mod A, OT managing foley. He then completed 120 ft of functional mobility to the therapy gym with the RW at [facility]- (S) level overall. Pt completed standing level reciprocal tapping activity with BUE support using a 5 in step 3x20 repetitions. Activity performed to challenge dynamic standing balance and functional activity tolerance to simulate household threshold management and reduce fall risk. Pt required min-mod facilitation to maintain upright posture and anterior pelvic tilt. He returned to his room following and was left sitting up in the recliner with all needs met. Wife present.   BP EOB: 103/61 , 66 bpm BP Seated in w/c following bathing 103/62, 69 bpm  BP standing: 92/54, 83 bpm  BP following 50 ft of mobility, still standing- 99/55, 88 bpm  Therapy Documentation Precautions:  Precautions Precautions: Fall, Other (comment) Recall of Precautions/Restrictions: Impaired Precaution/Restrictions Comments: L crani, Seizure, HOH, hx of dementia, orthostatic Restrictions Weight Bearing Restrictions Per Provider Order: No  Therapy/Group: Individual Therapy  Jesse [name] 10/18/2023, 6:17 AM

## 2023-10-18 NOTE — Progress Notes (Signed)
 Speech Language Pathology Weekly Progress and Session Note  Patient Details  Name: Jesse [name] MRN: [id number] Date of Birth: Sep 30, 1940  Beginning of progress report period: Oct 11, 2023 End of progress report period: Oct 18, 2023  Today's Date: 10/18/2023 SLP Individual Time: 1000-1100 SLP Individual Time Calculation (min): 60 min  Short Term Goals: Week 1: SLP Short Term Goal 1 (Week 1): Patient will demonstrate simple problem solving skills during functional tasks given mod multimodal A SLP Short Term Goal 1 - Progress (Week 1): Met SLP Short Term Goal 2 (Week 1): Patient will demonstrate carryover of basic daily information given mod multimodal A SLP Short Term Goal 2 - Progress (Week 1): Not met SLP Short Term Goal 3 (Week 1): Patient will demonstrate sustained attention during functional tasks for upwards of 10 minutes given mod multimodal A SLP Short Term Goal 3 - Progress (Week 1): Met SLP Short Term Goal 4 (Week 1): Patient will recall current medical situations and 2 functional deficits given mod multimodal A SLP Short Term Goal 4 - Progress (Week 1): Discontinued (comment)    New Short Term Goals: Week 2: SLP Short Term Goal 1 (Week 2): STGs = LTGs d/t ELOS  Weekly Progress Updates: Slight progress noted this week, as evidenced by slightly improved problem solving and attention. He continues to require modA overall for cognition at this time, though severe deficits re insight remain. Additionally, pt continues to respond better to encouragement from his wife vs therapists and self limiting behaviors are evident. Some LTGs downgraded given progress thus far and motor speech goal was added given introduction of EMST to assist w/ vocal intensity/breath support. Pt/family education ongoing. He would benefit from continued ST to maximize cognitive-linguistic skills and pt independence, as well as reduce caregiver burden.     Intensity: Minumum of 1-2 x/day, 30 to 90  minutes Frequency: 3 to 5 out of 7 days Duration/Length of Stay: 5/27 Treatment/Interventions: Cognitive remediation/compensation;Cueing hierarchy;Internal/external aids;Patient/family education;Therapeutic Activities;Speech/Language facilitation   Daily Session  Skilled Therapeutic Interventions:  Pt and his wife greeted at bedside. Fatigue was apparent, but he was cooperative throughout tx tasks targeting cognition and motor speech production. SLP reviewed EMST as introduced in prev tx session. Pt was able to complete full EMST protocol (25 reps total) via [id number] w/ minA verbal/visual cues to ensure adequate timing. He completed reps @ 15 cmH2O, but tolerated increase to 23-25 cmH2O. Pt encouraged to complete EMST protocol this weekend at the increased resistance level. After, pt completed money management task calculating cash. He benefited from [facility] for attention to detail and information processing, but was able to sustain attention to task for ~13 mins w/ only s cues. He and his wife verbalized understanding of education provided re upcoming d/c, OP ST, and functional cognitive tasks to challenge him in the home environment. He was left in his chair with his wife present. Recommend cont ST per POC.    Pain  No pain reported   Therapy/Group: Individual Therapy  Jesse [name] 10/18/2023, 12:59 PM

## 2023-10-18 NOTE — Progress Notes (Addendum)
 PROGRESS NOTE   Subjective/Complaints: Patient started on IV fluids overnight, I's and O's remain net negative but blood pressure does look better this morning, low 100s over 60s.  Temperature a little low at 97.5.  Sodium improved this a.m. to 132 again, other labs stable.  Patient reporting feelings of urination despite Foley being in place overnight.  Repositioned and scanned, he is emptying appropriately.  Denies any fevers, chills, suprapubic tenderness or nausea.  Patient's wife inquiring about the possibility of blood transfusion due to persistent hemoglobin 10; discussed that while low, his hemoglobin is stable, and this level does not indicate need for transfusion.  She refuses p.o. iron supplementation due to constipation.  Patient's wife also inquires about increase midodrine, read online that it can cause severe supine hypertension and place patient at risk for hemorrhagic stroke.  Discussed that, while this is a possible risk, his blood pressures have remained low and stable whether supine or upright while on medication, suspicion for this complication is low.  Patient's wife wondering why he was started on IV fluids overnight, discussed findings of SIADH and improvement in hyponatremia this a.m.  Given that he has remained severely net negative with his I's and O's and orthostatic, agreeable to continuing IV fluid overnight and if his blood pressure stays up today trialing DC midodrine.  She reports no issues today other than feeling generally "tired"  ROS: Limited due to cognitive/behavioral   + Fatigue--consistently later in the day + Orthostasis--stable  Objective:   No results found.  Recent Labs    10/17/23 0649  WBC 8.8  HGB 10.2*  HCT 30.2*  PLT 231   Recent Labs    10/17/23 0649 10/18/23 0620  NA 130* 132*  K 4.3 4.3  CL 101 101  CO2 24 24  GLUCOSE 97 98  BUN 20 21  CREATININE 0.76 0.75   CALCIUM  8.4* 8.1*    Intake/Output Summary (Last 24 hours) at 10/18/2023 0848 Last data filed at 10/18/2023 0700 Gross per 24 hour  Intake 1504.8 ml  Output 2225 ml  Net -720.2 ml        Physical Exam: Vital Signs Blood pressure 101/61, pulse 66, temperature (!) 97.4 F (36.3 C), resp. rate 17, height 5\' 11"  (1.803 m), weight 74.6 kg, SpO2 98%.  General: No acute distress, sitting upright in bedside chair . HEENT: left scalp with curvilinear incision with suture, clean and dry.   PERRLA, EOMI, sclera anicteric, oral mucosa pink and moist Neck: Supple without JVD or lymphadenopathy Heart: Reg rate and rhythm. No murmurs rubs or gallops Chest: CTA bilaterally without wheezes, rales, or rhonchi; no distress Abdomen: Moderately distended, hypoactive bowel sounds,, nontender to palpation.  --Consistent with prior exams. Extremities: No clubbing, cyanosis, or edema.  Wearing TED hose. Psych: Pt's affect is flat, appropriate mood. Skin: scalp as above, intact.  Peripheral IV intact. Neuro: patient awake and alert on exam.  Oriented x 3 with increased time for responses. moderate memory deficits and mild concentration deficits--worse in the late afternoon. Cranial nerves II through XII intact Moving all 4 extremities 4 out of 4 antigravity against resistance Sensation intact   Musculoskeletal: Full ROM, No pain  with AROM or PROM in the neck, trunk, or extremities. Posture appropriate   No significant changes 5-23  Assessment/Plan: 1. Functional deficits which require 3+ hours per day of interdisciplinary therapy in a comprehensive inpatient rehab setting. Physiatrist is providing close team supervision and 24 hour management of active medical problems listed below. Physiatrist and rehab team continue to assess barriers to discharge/monitor patient progress toward functional and medical goals  Care Tool:  Bathing    Body parts bathed by patient: Right arm, Left arm, Chest,  Abdomen, Right upper leg, Left upper leg, Front perineal area, Face, Buttocks, Right lower leg, Left lower leg   Body parts bathed by helper: Buttocks, Right lower leg, Left lower leg     Bathing assist Assist Level: Minimal Assistance - Patient > 75%     Upper Body Dressing/Undressing Upper body dressing   What is the patient wearing?: Pull over shirt    Upper body assist Assist Level: Supervision/Verbal cueing    Lower Body Dressing/Undressing Lower body dressing      What is the patient wearing?: Pants     Lower body assist Assist for lower body dressing: Moderate Assistance - Patient 50 - 74%     Toileting Toileting    Toileting assist Assist for toileting: Moderate Assistance - Patient 50 - 74%     Transfers Chair/bed transfer  Transfers assist     Chair/bed transfer assist level: Contact Guard/Touching assist     Locomotion Ambulation   Ambulation assist      Assist level: Contact Guard/Touching assist Assistive device: Walker-rolling Max distance: 170   Walk 10 feet activity   Assist     Assist level: Contact Guard/Touching assist Assistive device: Walker-rolling   Walk 50 feet activity   Assist Walk 50 feet with 2 turns activity did not occur: Safety/medical concerns  Assist level: Contact Guard/Touching assist Assistive device: Walker-rolling    Walk 150 feet activity   Assist Walk 150 feet activity did not occur: Safety/medical concerns  Assist level: Contact Guard/Touching assist Assistive device: Walker-rolling    Walk 10 feet on uneven surface  activity   Assist Walk 10 feet on uneven surfaces activity did not occur: Safety/medical concerns         Wheelchair     Assist Is the patient using a wheelchair?: Yes Type of Wheelchair: Manual    Wheelchair assist level: Dependent - Patient 0% Max wheelchair distance: 150'    Wheelchair 50 feet with 2 turns activity    Assist        Assist Level:  Dependent - Patient 0%   Wheelchair 150 feet activity     Assist      Assist Level: Dependent - Patient 0%   Blood pressure 101/61, pulse 66, temperature (!) 97.4 F (36.3 C), resp. rate 17, height 5\' 11"  (1.803 m), weight 74.6 kg, SpO2 98%.  Medical Problem List and Plan: 1. Functional deficits secondary to traumatic SDH s/p crani/evacuation x2  -pt also with meningioma s/p resection             -patient may  shower -cover incision/crani             -ELOS/Goals: 9-12 days min A-- 5/27 DC date -Continue CIR PT, OT, and SLP 5-19: Per discharge summary, Decadron  taper was to be stopped; will initiate slow taper over the next 8 days given ongoing blood pressure issues. 5/20: Wife receptive to education. Min-Mod LBD, Mod for ambulation with orthostatics. May downgrade to Min  A goals. Per wife ambulation looking pretty close to normal. CGA for transfers and up to 200 ft ambulation with a walker. Mod A cognition.   2.  Antithrombotics: -DVT/anticoagulation:  Pharmaceutical: Lovenox  added on 05/15.             -antiplatelet therapy: N/A 3. Pain Management:  Oxycodone  prn. Pt denies pain 4. Mood/Behavior/Sleep: LCSW to follow for evaluation and support.              -antipsychotic agents: N/A  -pt very reliant on wife for emotional support, decision-making   -  5/20: Sleep log added per request--sleeping well.  5. Neuropsych/cognition: This patient may not be fully capable of making decisions on his own behalf.  5/20: Per wife will have intermittent muteness at baseline; ?underlying dementia vs. Mood/behavioral  6. Skin/Wound Care: Routine pressure relief Mesures.    - 5/22: Per family, Dr. Ellery Guthrie coming in for wound evaluation and suture removal today. Since 12 days post-op, will remove tomorrow if not.   5/23: Suture removal ordered  7. Fluids/Electrolytes/Nutrition: Monitor I/O. Check CMET in a.              --monitor intake--did not want to eat "wife will feed me"  -10/12/23 BMP  stabe, Cr 0.68  -5-19: BUN/creatinine stable, hyponatremia improved  5-23: Good p.o. intakes, however I's and O's remain negative daily.  Will start overnight IV fluids as below.  8. Vasovagal syncope/post ictal aphasia?: On keppra  750 mg BID per Dr. Arora --monitor for orthostatic changes. stopped Flomax  per wife request  -5/16- spoke with wife who has concerns that the increased keppra  dose has affected his mentation. I contacted Dr. Bonnita Buttner who felt that we could go back to the 500mg  bid dose for now and observe.   5/18 discussed prior decrease decrease Keppra  dose 500 mg twice daily with wife  5-19:  Wife requesting EEG or reduction of Keppra ; given no acute concern for seizures, do not feel EEG would be beneficial at this time.  Will discuss with Dr. Arora further dose reduction before initiating.  5-20: Neurology reducing Keppra  to 250 mg twice daily dose per wife's request, after discussion of risks versus benefits.  9. Abdominal distension:   had BM yesterday --Hx of sigmoid volvulus 09/2017.   5/16-colonic ileus on KUB yesterday. Don't need another KUB this morning as exam is unchanged--recheck KUB tomorrow -CMET pending -clears only -IVF, IV reglan  -SSE today -(may have to wait for wife to be present to begin these interventions) -5/17 + medium BM today, reports appetite is a little improving, continue current diet, Reglan  and reassess tomorrow -soap suds enema today 5/18 soft diet started, patient's wife feels like Reglan  is over sedating him.  He is tolerating soft diet today.    Discussed with GI Reglan  would not likely provide much benefit for colonic ileus, will discontinue this.   Clinically doing much better. -GI recommended considering Linzess to 290 mcg daily, consider enema tomorrow if not improving.  Wife would like him not to have Linzess, reports his colon is chronically distended due to prior surgery.  She said he always has bowel movements in the evening after drinking  water.  Will hold off on Linzess today consider if not improving. 5-19: Producing small soft, soft bowel movements approximately twice daily.  Tolerating p.o. diet.  Continue current regimen, encourage p.o. fluids.  Per wife, despite ongoing colonic distention and large stool burden on x-ray, wishes to not escalate bowel regimen further and requests  no further imaging, no GI input/consultation. 5-20: Large bowel movement this afternoon per wife; last bowel movement that afternoon, medium. 5/22: LBM  10. Urinary retention/? CAUTI: Hx of Prostate CA s/p prostatectomy/XRT --Will d/c flomax  per multiple wife's request.  --Continue finasteride  which was added on 05/13 -5/14-can continue foley until ileus is resolved 5/18 wife indicates that she would like to continue Foley catheter for now and follow-up with urology outpatient--discussed considerable risks of this regarding infection, she wishes to continue 5-23: Patient complaining of symptoms of urinary urgency/spasms.  Foley changed out, initial urinalysis shows large leukocyte esterase and rare bacteria but no nitrites.  Given active symptoms, will start Bactrim 800 mg twice daily for 7 days.  Trend cultures.   - Will hold off on starting Urispas at this time due to history of gastric ileus.  11. Hyponatremia/SIADH: Monitor for now.  -5/17 stable at 131  Stable to slightly improved 132 5-19  5-22: Hyponatremia down to 130 today.  Will get serum osmolality, urine sodium, urine osmolality to assess.--Indicate mild SIADH, will initiate fluid restriction and overnight normal saline for blood pressure support and sodium supplementation.  Complicated by weaning of glucocorticoids as above.  5/23: Sodium looking better, 132.  Will get BMPs through the weekend and continue overnight IV fluid for blood pressure and sodium support. Daily BMP to trend.   12. PAF: Monitor HR TID--on amiodarone.  --Not on [facility] per patient, chart review and pharmacy  review. -5/17-18 HR stable monitor     10/18/2023    8:00 AM 10/18/2023    6:26 AM 10/17/2023    7:53 PM  Vitals with BMI  Systolic 101 102 161  Diastolic 61 63 53  Pulse 66 52 71    13. Acute blood loss anemia:    -5/16 hgb stable at 10.1 today\  Recheck tomorrow--stable, 9.7  5-23: Wife refusing oral iron supplements, patient not appropriate for IV blood transfusion, hemoglobin stable without signs of active bleeding, not appropriate for IV iron repletion.  Could benefit from dietary education as an outpatient on dark, leafy greens and other high iron foods.  14. H/o LBP radiating to LLE/Neurogenic claudication: Monitor for symptoms with increase in activity.   15.  Hypotension.  - 5-19: Encourage p.o. fluids, added TED hose.  Will avoid binder due to gastrointestinal issues as above.  Later, received IV fluid 1 L for SBP 70s.;  Responsive.  Monitor tomorrow with therapies, may need addition of midodrine.  5-20: Remains with severe orthostatic hypotension after IV fluids yesterday, although with some improvement--per therapies will recover quickly once he is walking.  Add midodrine 2.5 mg twice daily with meals.  Check a.m. cortisol levels once off of Decadron .  5-21: Continues to refuse a.m. orthostats, did well with therapies this a.m. but refused afternoon sessions due to feelings of fatigue.  Given consistent complaints in the afternoon, will increase midodrine to 3 times daily versus initiate overnight maintenance fluids for blood pressure support; will discuss with wife in a.m.  5/22: Continues to be more fatigued/refusing therapies in the evening.  Blood pressure remains stable but very soft.  Will increase midodrine to 2.5 mg 3 times daily.  If fluid restriction required for SIADH based on labs, may need to do normal saline infusions overnight to support BP--will start 5/22  5-23: Blood pressure looking a little better.  Monitor for today, if holds will DC midodrine prior to the  weekend--midodrine DC'd 5-23  LOS: 8 days A FACE TO FACE EVALUATION  WAS PERFORMED  Bea Lime 10/18/2023, 8:48 AM

## 2023-10-18 NOTE — Progress Notes (Signed)
 Physical Therapy Weekly Progress Note  Patient Details  Name: Jesse [name] MRN: [id number] Date of Birth: 12-Nov-1940  Beginning of progress report period: Oct 11, 2023 End of progress report period: Oct 18, 2023  Today's Date: 10/18/2023 PT Individual Time: 1315-1414 PT Individual Time Calculation (min): 59 min   Patient has met 3 of 4 short term goals. Pt is progressing well toward mobility goals, improving independence with bed mobility, transfers, ambulation, and balance. Pt is performing mobility with minA to CGA overall, and continues to be a significant fall risk due to impairments. [name] will benefit from hands-on family education prior to discharge.   Patient continues to demonstrate the following deficits muscle weakness, decreased cardiorespiratoy endurance, decreased coordination, decreased initiation, decreased attention, decreased problem solving, and demonstrates behaviors consistent with [place], and decreased sitting balance, decreased standing balance, decreased postural control, and decreased balance strategies and therefore will continue to benefit from skilled PT intervention to increase functional independence with mobility.  Patient progressing toward long term goals..  Continue plan of care.  PT Short Term Goals Week 1:  PT Short Term Goal 1 (Week 1): Pt will complete bed mobility with supervision PT Short Term Goal 1 - Progress (Week 1): Progressing toward goal PT Short Term Goal 2 (Week 1): Pt will complete bed to chair transfer with CGA PT Short Term Goal 2 - Progress (Week 1): Met PT Short Term Goal 3 (Week 1): Pt will ambulate x50' with minA and LRAD. PT Short Term Goal 3 - Progress (Week 1): Met PT Short Term Goal 4 (Week 1): Pt will complete balance assessment PT Short Term Goal 4 - Progress (Week 1): Met Week 2:  PT Short Term Goal 1 (Week 2): STGs = LTGs  Skilled Therapeutic Interventions/Progress Updates:     Pt misses 15 minutes of skilled  PT due to being cathed by RN staff. Upon return pt is agreeable to therapy and no complaint of pain. Supine to sit with minA and cues for logrolling and sequencing. Pt performs sit to stand with minA and RW, with cues for hand placement and initiation. Pt ambulates x15' to toilet with CGA and RW, then transfers to toilet with minA and cues for use of grab bars. Following toileting, pt transported to gym via [facility] for time management. Pt performs stand step to Nustep with minA and cues for sequencing and hand placement. Pt completes Nustep for endurance training and reciprocal coordination training. Pt completes x10:00 at workload of 5 with average steps per minute ~48. PT provides cues for hand and foot placement and completing full available ROM. Following rest break, pt ambulates x175' with RW and CGA/minA, with cues to increase stride length and gait speed, as well as maintain upright gaze to improve balance and posture. Seated rest break. Pt ambulates x100' back to room with RW and same assistance and cues. Sit to supine with cues for positioning and sequencing. Left supine with alarm intact and all needs within reach.   Therapy Documentation Precautions:  Precautions Precautions: Fall, Other (comment) Recall of Precautions/Restrictions: Impaired Precaution/Restrictions Comments: L crani, Seizure, HOH, hx of dementia, orthostatic Restrictions Weight Bearing Restrictions Per Provider Order: No   Therapy/Group: Individual Therapy  Neva Barban, PT, DPT 10/18/2023, 3:32 PM

## 2023-10-18 NOTE — Progress Notes (Signed)
 patient reports feeling like he needs to urinate with foley in and draining. , states abdom pressure  with LBM 10/16/23; Wife of patient had questions regarding urology follow up/ concerns over bladder/ and concerns over hemoglobin level/ also asked when sutures were to be removed, MD/PA contacted, and followed up, ordered Flavoxate/ Foley Change/ and clean catch for UA

## 2023-10-19 ENCOUNTER — Other Ambulatory Visit (HOSPITAL_COMMUNITY): Payer: Self-pay

## 2023-10-19 LAB — BASIC METABOLIC PANEL WITH GFR
Anion gap: 7 (ref 5–15)
BUN: 18 mg/dL (ref 8–23)
CO2: 25 mmol/L (ref 22–32)
Calcium: 8.2 mg/dL — ABNORMAL LOW (ref 8.9–10.3)
Chloride: 102 mmol/L (ref 98–111)
Creatinine, Ser: 0.87 mg/dL (ref 0.61–1.24)
GFR, Estimated: >60 mL/min (ref >60)
Glucose, Bld: 100 mg/dL — ABNORMAL HIGH (ref 70–99)
Potassium: 4.5 mmol/L (ref 3.5–5.1)
Sodium: 134 mmol/L — ABNORMAL LOW (ref 135–145)

## 2023-10-19 NOTE — Progress Notes (Signed)
 Speech Language Pathology Daily Session Note  Patient Details  Name: Jesse [name] MRN: [id number] Date of Birth: 1940-06-21  Today's Date: 10/19/2023 SLP Individual Time: 1015-1055 SLP Individual Time Calculation (min): 40 min Short Term Goals: Week 2: SLP Short Term Goal 1 (Week 2): STGs = LTGs d/t ELOS  Skilled Therapeutic Interventions: SLP conducted skilled therapy session targeting cognitive retraining goals. Upon SLP entry, patient's wife assisting with dressing and transfer to recliner to facilitate optimal positioning for speech therapy. Once patient was transferred, he engaged in various therapy tasks. SLP facilitated EMST completion, 5 sets of 5 at [facility] to promote increased vocal intensity. Patient benefited from supervision to complete accurately. Patient then discussed anticipated challenges with impending discharge. Patient notes that he is most worried about showering, as he does not recall completing a shower since admission. SLP encouraged patient to discuss any questions re: showering with OT team. SLP then facilitated sustained attention to detail task. Patient benefited from min assist to complete accurately and thoroughly. Patient was left in room with call bell in reach and alarm set. SLP will continue to target goals per plan of care.        Pain Pain Assessment Pain Scale: Faces Pain Score: 0-No pain Faces Pain Scale: No hurt  Therapy/Group: Individual Therapy  Shondell Poulson, M.A., CCC-SLP  Issac Moure A Hava Massingale 10/19/2023, 10:57 AM

## 2023-10-19 NOTE — Plan of Care (Signed)
   Problem: Consults Goal: RH BRAIN INJURY PATIENT EDUCATION Description: Description: See Patient Education module for eduction specifics Outcome: Progressing

## 2023-10-19 NOTE — Progress Notes (Signed)
 PROGRESS NOTE   Subjective/Complaints: His wife asks about SNF, discussed that I would reach out to SW and MD about this request He has no other complaints  ROS: Limited due to cognitive/behavioral   + Fatigue--consistently later in the day + Orthostasis--stable Staples were removed last night  Objective:   No results found.  Recent Labs    10/17/23 0649  WBC 8.8  HGB 10.2*  HCT 30.2*  PLT 231   Recent Labs    10/18/23 0620 10/19/23 0738  NA 132* 134*  K 4.3 4.5  CL 101 102  CO2 24 25  GLUCOSE 98 100*  BUN 21 18  CREATININE 0.75 0.87  CALCIUM  8.1* 8.2*    Intake/Output Summary (Last 24 hours) at 10/19/2023 1248 Last data filed at 10/19/2023 0835 Gross per 24 hour  Intake 1203 ml  Output 2755 ml  Net -1552 ml        Physical Exam: Vital Signs Blood pressure 105/62, pulse 71, temperature 97.9 F (36.6 C), temperature source Oral, resp. rate 16, height 5\' 11"  (1.803 m), weight 74.6 kg, SpO2 97%.  General: No acute distress, sitting upright in bedside chair . HEENT: left scalp with curvilinear incision with suture, clean and dry.   PERRLA, EOMI, sclera anicteric, oral mucosa pink and moist Neck: Supple without JVD or lymphadenopathy Heart: Reg rate and rhythm. No murmurs rubs or gallops Chest: CTA bilaterally without wheezes, rales, or rhonchi; no distress Abdomen: Moderately distended, hypoactive bowel sounds,, nontender to palpation.  --Consistent with prior exams. Extremities: No clubbing, cyanosis, or edema.  Wearing TED hose. Psych: Pt's affect is flat, appropriate mood. Skin: scalp as above, intact.  Peripheral IV intact. Neuro: patient awake and alert on exam.  Oriented x 3 with increased time for responses. moderate memory deficits and mild concentration deficits--worse in the late afternoon. Cranial nerves II through XII intact Moving all 4 extremities 4 out of 4 antigravity against  resistance Sensation intact   Musculoskeletal: Full ROM, No pain with AROM or PROM in the neck, trunk, or extremities. Posture appropriate   Stable 5/24  Assessment/Plan: 1. Functional deficits which require 3+ hours per day of interdisciplinary therapy in a comprehensive inpatient rehab setting. Physiatrist is providing close team supervision and 24 hour management of active medical problems listed below. Physiatrist and rehab team continue to assess barriers to discharge/monitor patient progress toward functional and medical goals  Care Tool:  Bathing    Body parts bathed by patient: Right arm, Left arm, Chest, Abdomen, Right upper leg, Left upper leg, Front perineal area, Face, Buttocks, Right lower leg, Left lower leg   Body parts bathed by helper: Buttocks, Right lower leg, Left lower leg     Bathing assist Assist Level: Contact Guard/Touching assist     Upper Body Dressing/Undressing Upper body dressing   What is the patient wearing?: Pull over shirt    Upper body assist Assist Level: Supervision/Verbal cueing    Lower Body Dressing/Undressing Lower body dressing      What is the patient wearing?: Pants     Lower body assist Assist for lower body dressing: Minimal Assistance - Patient > 75%     Toileting  Toileting    Toileting assist Assist for toileting: Moderate Assistance - Patient 50 - 74%     Transfers Chair/bed transfer  Transfers assist     Chair/bed transfer assist level: Contact Guard/Touching assist     Locomotion Ambulation   Ambulation assist      Assist level: Minimal Assistance - Patient > 75% Assistive device: Walker-rolling Max distance: 175'   Walk 10 feet activity   Assist     Assist level: Minimal Assistance - Patient > 75% Assistive device: Walker-rolling   Walk 50 feet activity   Assist Walk 50 feet with 2 turns activity did not occur: Safety/medical concerns  Assist level: Minimal Assistance - Patient >  75% Assistive device: Walker-rolling    Walk 150 feet activity   Assist Walk 150 feet activity did not occur: Safety/medical concerns  Assist level: Minimal Assistance - Patient > 75% Assistive device: Walker-rolling    Walk 10 feet on uneven surface  activity   Assist Walk 10 feet on uneven surfaces activity did not occur: Safety/medical concerns         Wheelchair     Assist Is the patient using a wheelchair?: Yes Type of Wheelchair: Manual    Wheelchair assist level: Dependent - Patient 0% Max wheelchair distance: 150'    Wheelchair 50 feet with 2 turns activity    Assist        Assist Level: Dependent - Patient 0%   Wheelchair 150 feet activity     Assist      Assist Level: Dependent - Patient 0%   Blood pressure 105/62, pulse 71, temperature 97.9 F (36.6 C), temperature source Oral, resp. rate 16, height 5\' 11"  (1.803 m), weight 74.6 kg, SpO2 97%.  Medical Problem List and Plan: 1. Functional deficits secondary to traumatic SDH s/p crani/evacuation x2  -pt also with meningioma s/p resection             -patient may  shower -cover incision/crani             -ELOS/Goals: 9-12 days min A-- 5/27 DC date -Continue CIR PT, OT, and SLP 5-19: Per discharge summary, Decadron  taper was to be stopped; will initiate slow taper over the next 8 days given ongoing blood pressure issues. 5/20: Wife receptive to education. Min-Mod LBD, Mod for ambulation with orthostatics. May downgrade to Min A goals. Per wife ambulation looking pretty close to normal. CGA for transfers and up to 200 ft ambulation with a walker. Mod A cognition.   2.  Antithrombotics: -DVT/anticoagulation:  Pharmaceutical: Lovenox  added on 05/15.             -antiplatelet therapy: N/A 3. Pain Management:  Oxycodone  prn. Pt denies pain 4. Mood/Behavior/Sleep: LCSW to follow for evaluation and support.              -antipsychotic agents: N/A  -pt very reliant on wife for emotional  support, decision-making   -  5/20: Sleep log added per request--sleeping well.  5. Neuropsych/cognition: This patient may not be fully capable of making decisions on his own behalf.  5/20: Per wife will have intermittent muteness at baseline; ?underlying dementia vs. Mood/behavioral  6. Skin/Wound Care: Routine pressure relief Mesures.    - 5/22: Per family, Dr. Ellery Guthrie coming in for wound evaluation and suture removal today. Since 12 days post-op, will remove tomorrow if not.   5/23: Suture removal ordered  7. Fluids/Electrolytes/Nutrition: Monitor I/O. Check CMET in a.              --  monitor intake--did not want to eat "wife will feed me"  -10/12/23 BMP stabe, Cr 0.68  -5-19: BUN/creatinine stable, hyponatremia improved  5-23: Good p.o. intakes, however I's and O's remain negative daily.  Will start overnight IV fluids as below.  8. Vasovagal syncope/post ictal aphasia?: On keppra  750 mg BID per Dr. Arora --monitor for orthostatic changes. stopped Flomax  per wife request  -5/16- spoke with wife who has concerns that the increased keppra  dose has affected his mentation. I contacted Dr. Bonnita Buttner who felt that we could go back to the 500mg  bid dose for now and observe.   5/18 discussed prior decrease decrease Keppra  dose 500 mg twice daily with wife  5-19:  Wife requesting EEG or reduction of Keppra ; given no acute concern for seizures, do not feel EEG would be beneficial at this time.  Will discuss with Dr. Arora further dose reduction before initiating.  5-20: Neurology reducing Keppra  to 250 mg twice daily dose per wife's request, after discussion of risks versus benefits.  5/24: continue this dose of keppra   9. Abdominal distension:   had BM yesterday --Hx of sigmoid volvulus 09/2017.   5/16-colonic ileus on KUB yesterday. Don't need another KUB this morning as exam is unchanged--recheck KUB tomorrow -CMET pending -clears only -IVF, IV reglan  -SSE today -(may have to wait for wife to be  present to begin these interventions) -5/17 + medium BM today, reports appetite is a little improving, continue current diet, Reglan  and reassess tomorrow -soap suds enema today 5/18 soft diet started, patient's wife feels like Reglan  is over sedating him.  He is tolerating soft diet today.    Discussed with GI Reglan  would not likely provide much benefit for colonic ileus, will discontinue this.   Clinically doing much better. -GI recommended considering Linzess to 290 mcg daily, consider enema tomorrow if not improving.  Wife would like him not to have Linzess, reports his colon is chronically distended due to prior surgery.  She said he always has bowel movements in the evening after drinking water.  Will hold off on Linzess today consider if not improving. 5-19: Producing small soft, soft bowel movements approximately twice daily.  Tolerating p.o. diet.  Continue current regimen, encourage p.o. fluids.  Per wife, despite ongoing colonic distention and large stool burden on x-ray, wishes to not escalate bowel regimen further and requests no further imaging, no GI input/consultation. 5-20: Large bowel movement this afternoon per wife; last bowel movement that afternoon, medium. 5/22: LBM  10. Urinary retention/? CAUTI: Hx of Prostate CA s/p prostatectomy/XRT --Will d/c flomax  per multiple wife's request.  --Continue finasteride  which was added on 05/13 -5/14-can continue foley until ileus is resolved 5/18 wife indicates that she would like to continue Foley catheter for now and follow-up with urology outpatient--discussed considerable risks of this regarding infection, she wishes to continue 5-23: Patient complaining of symptoms of urinary urgency/spasms.  Foley changed out, initial urinalysis shows large leukocyte esterase and rare bacteria but no nitrites.  Given active symptoms, will start Bactrim 800 mg twice daily for 7 days.  Trend cultures.   - Will hold off on starting Urispas at this time  due to history of gastric ileus.  11. Hyponatremia/SIADH: Monitor for now.  -5/17 stable at 131  Stable to slightly improved 132 5-19  5-22: Hyponatremia down to 130 today.  Will get serum osmolality, urine sodium, urine osmolality to assess.--Indicate mild SIADH, will initiate fluid restriction and overnight normal saline for blood pressure support and sodium  supplementation.  Complicated by weaning of glucocorticoids as above.  Na improved to 134, continue IVF  12. PAF: Monitor HR TID--continue amiodarone.  --Not on [facility] per patient, chart review and pharmacy review. -5/17-18 HR stable monitor     10/18/2023    7:54 PM 10/18/2023    8:00 AM 10/18/2023    6:26 AM  Vitals with BMI  Systolic 105 101 409  Diastolic 62 61 63  Pulse 71 66 52    13. Acute blood loss anemia:    -5/16 hgb stable at 10.1 today\  Recheck tomorrow--stable, 9.7  5-23: Wife refusing oral iron supplements, patient not appropriate for IV blood transfusion, hemoglobin stable without signs of active bleeding, not appropriate for IV iron repletion.  Could benefit from dietary education as an outpatient on dark, leafy greens and other high iron foods.  14. H/o LBP radiating to LLE/Neurogenic claudication: Monitor for symptoms with increase in activity.   15.  Hypotension.  - 5-19: Encourage p.o. fluids, added TED hose.  Will avoid binder due to gastrointestinal issues as above.  Later, received IV fluid 1 L for SBP 70s.;  Responsive.  Monitor tomorrow with therapies, may need addition of midodrine.  5-20: Remains with severe orthostatic hypotension after IV fluids yesterday, although with some improvement--per therapies will recover quickly once he is walking.  Add midodrine 2.5 mg twice daily with meals.  Check a.m. cortisol levels once off of Decadron .  5-21: Continues to refuse a.m. orthostats, did well with therapies this a.m. but refused afternoon sessions due to feelings of fatigue.  Given consistent complaints in  the afternoon, will increase midodrine to 3 times daily versus initiate overnight maintenance fluids for blood pressure support; will discuss with wife in a.m.  5/22: Continues to be more fatigued/refusing therapies in the evening.  Blood pressure remains stable but very soft.  Will increase midodrine to 2.5 mg 3 times daily.  If fluid restriction required for SIADH based on labs, may need to do normal saline infusions overnight to support BP--will start 5/22  Discussed with nursing that we can keep midodrine off since BP has remained stable.   LOS: 9 days A FACE TO FACE EVALUATION WAS PERFORMED  Liam Redhead 10/19/2023, 12:48 PM

## 2023-10-20 LAB — URINE CULTURE: Culture: 40000 — AB

## 2023-10-20 LAB — BASIC METABOLIC PANEL WITH GFR
Anion gap: 5 (ref 5–15)
BUN: 22 mg/dL (ref 8–23)
CO2: 22 mmol/L (ref 22–32)
Calcium: 7.8 mg/dL — ABNORMAL LOW (ref 8.9–10.3)
Chloride: 105 mmol/L (ref 98–111)
Creatinine, Ser: 1.02 mg/dL (ref 0.61–1.24)
GFR, Estimated: >60 mL/min (ref >60)
Glucose, Bld: 91 mg/dL (ref 70–99)
Potassium: 4.3 mmol/L (ref 3.5–5.1)
Sodium: 132 mmol/L — ABNORMAL LOW (ref 135–145)

## 2023-10-20 MED ORDER — MIDODRINE HCL 5 MG PO TABS
2.5000 mg | ORAL_TABLET | Freq: Every day | ORAL | Status: DC
  Administered 2023-10-20 – 2023-10-22 (×3): 2.5 mg via ORAL
  Filled 2023-10-20 (×3): qty 1

## 2023-10-20 NOTE — Progress Notes (Signed)
 Physical Therapy Session Note  Patient Details  Name: Jesse [name] MRN: [id number] Date of Birth: 1940-09-12  Today's Date: 10/20/2023 PT Individual Time: 0810-0900 PT Individual Time Calculation (min): 50 min   Short Term Goals: Week 2:  PT Short Term Goal 1 (Week 2): STGs = LTGs  Skilled Therapeutic Interventions/Progress Updates: Pt presents sitting EOB w/ spouse dressing patient.  Initially pt was finishing breakfast and spouse asked for 5 minutes to finish.  PT returned and performed SPT bed > w/c w/ CGA.  Pt negotiated w/c w/ LES only for increased strength, endurance and for increased ROM.  Pt cued for increased reach w/ LES for larger amplitude movements.  Pt transfers sit to stand w/ CGA, but cues for methodical reach for RW to maintain balance as grabs for handles x 1 trial and almost fell back into w/c.  Pt performed standing cornhole toss w/o UE support, but frequent cues for knee flexion, esp. LLE.  Pt encouraged to come to upright posture after throw and then to lean forward to throw.  Pt negotiated cone obstacle course w/ RW and CGA, but cues for BOS.  Pt amb x 175' w/ RW and improved posture, w/ trunk straight x 80% of gait trial!  Pt amb to room and to recliner in corner, cues for turning correct way to avoid excessive backward steps.  Pt remained in recliner w/ spouse present, all needs in reach.  Missed time x 10 min.     Therapy Documentation Precautions:  Precautions Precautions: Fall, Other (comment) Recall of Precautions/Restrictions: Impaired Precaution/Restrictions Comments: L crani, Seizure, HOH, hx of dementia, orthostatic Restrictions Weight Bearing Restrictions Per Provider Order: No General: PT Amount of Missed Time (min): 10 Minutes PT Missed Treatment Reason: Unavailable (Comment) (eating breakfast.) Vital Signs: Therapy Vitals Temp: 98.1 F (36.7 C) Pulse Rate: 63 Resp: 17 BP: 97/61 Patient Position (if appropriate): Lying Oxygen Therapy SpO2: 97  % O2 Device: Room Air Pain: generalized pain/discomfort, no rating.     Therapy/Group: Individual Therapy  Chaunte Hornbeck P Meyer Arora 10/20/2023, 9:03 AM

## 2023-10-20 NOTE — Plan of Care (Signed)
  Problem: Consults Goal: RH BRAIN INJURY PATIENT EDUCATION Description: Description: See Patient Education module for eduction specifics Outcome: Progressing   Problem: RH BOWEL ELIMINATION Goal: RH STG MANAGE BOWEL WITH ASSISTANCE Description: STG Manage Bowel with supervision Assistance. Outcome: Progressing   Problem: RH BLADDER ELIMINATION Goal: RH STG MANAGE BLADDER WITH ASSISTANCE Description: STG Manage Bladder With supervision Assistance Outcome: Progressing   Problem: RH SKIN INTEGRITY Goal: RH STG SKIN FREE OF INFECTION/BREAKDOWN Description: Manage skin free of infection/breakdown with supervision Outcome: Progressing   Problem: RH SAFETY Goal: RH STG ADHERE TO SAFETY PRECAUTIONS W/ASSISTANCE/DEVICE Description: STG Adhere to Safety Precautions With  supervision Assistance/Device. Outcome: Progressing   Problem: RH COGNITION-NURSING Goal: RH STG USES MEMORY AIDS/STRATEGIES W/ASSIST TO PROBLEM SOLVE Description: STG Uses Memory Aids/Strategies With supervision Assistance to Problem Solve. Outcome: Progressing   Problem: RH PAIN MANAGEMENT Goal: RH STG PAIN MANAGED AT OR BELOW PT'S PAIN GOAL Description: <4 w/ prns Outcome: Progressing   Problem: RH KNOWLEDGE DEFICIT BRAIN INJURY Goal: RH STG INCREASE KNOWLEDGE OF SELF CARE AFTER BRAIN INJURY Description: Manage increase knowledge of self care after brain injury with supervision assistance from wife using educational materials provided Outcome: Progressing

## 2023-10-20 NOTE — Plan of Care (Signed)
  Problem: Consults Goal: RH BRAIN INJURY PATIENT EDUCATION Description: Description: See Patient Education module for eduction specifics Outcome: Progressing   Problem: RH BOWEL ELIMINATION Goal: RH STG MANAGE BOWEL WITH ASSISTANCE Description: STG Manage Bowel with supervision Assistance. Outcome: Progressing   Problem: RH BLADDER ELIMINATION Goal: RH STG MANAGE BLADDER WITH ASSISTANCE Description: STG Manage Bladder With supervision Assistance Outcome: Progressing   Problem: RH SKIN INTEGRITY Goal: RH STG SKIN FREE OF INFECTION/BREAKDOWN Description: Manage skin free of infection/breakdown with supervision Outcome: Progressing   Problem: RH SAFETY Goal: RH STG ADHERE TO SAFETY PRECAUTIONS W/ASSISTANCE/DEVICE Description: STG Adhere to Safety Precautions With  supervision Assistance/Device. Outcome: Progressing   Problem: RH COGNITION-NURSING Goal: RH STG USES MEMORY AIDS/STRATEGIES W/ASSIST TO PROBLEM SOLVE Description: STG Uses Memory Aids/Strategies With supervision Assistance to Problem Solve. Outcome: Progressing   Problem: RH PAIN MANAGEMENT Goal: RH STG PAIN MANAGED AT OR BELOW PT'S PAIN GOAL Description: <4 w/ prns Outcome: Progressing

## 2023-10-20 NOTE — Progress Notes (Signed)
 PROGRESS NOTE   Subjective/Complaints: Having shower this morning BP is soft, midodrine started at 2.5mg  daily Discussed that wife wants to pursue SNF, discussed that SW may be back tomorrow, and definitely will be back on Tuesday  ROS: Limited due to cognitive/behavioral   + Fatigue--consistently later in the day + Orthostasis--stable Staples were removed last night  Objective:   No results found.  No results for input(s): "WBC", "HGB", "HCT", "PLT" in the last 72 hours.  Recent Labs    10/19/23 0738 10/20/23 0651  NA 134* 132*  K 4.5 4.3  CL 102 105  CO2 25 22  GLUCOSE 100* 91  BUN 18 22  CREATININE 0.87 1.02  CALCIUM  8.2* 7.8*    Intake/Output Summary (Last 24 hours) at 10/20/2023 1441 Last data filed at 10/20/2023 0950 Gross per 24 hour  Intake 1223.89 ml  Output 1100 ml  Net 123.89 ml        Physical Exam: Vital Signs Blood pressure 101/72, pulse 60, temperature (!) 97.5 F (36.4 C), temperature source Oral, resp. rate 17, height 5\' 11"  (1.803 m), weight 76.4 kg, SpO2 98%.  General: No acute distress, sitting upright in bedside chair . HEENT: left scalp with curvilinear incision with suture, clean and dry.   PERRLA, EOMI, sclera anicteric, oral mucosa pink and moist Neck: Supple without JVD or lymphadenopathy Heart: Reg rate and rhythm. No murmurs rubs or gallops Chest: CTA bilaterally without wheezes, rales, or rhonchi; no distress Abdomen: Moderately distended, hypoactive bowel sounds,, nontender to palpation.  --Consistent with prior exams. Extremities: No clubbing, cyanosis, or edema.  Wearing TED hose. Psych: Pt's affect is flat, appropriate mood. Skin: scalp as above, intact.  Peripheral IV intact. Neuro: patient awake and alert on exam.  Oriented x 3 with increased time for responses. moderate memory deficits and mild concentration deficits--worse in the late afternoon. Cranial nerves II  through XII intact Moving all 4 extremities 4 out of 4 antigravity against resistance Sensation intact   Musculoskeletal: Full ROM, No pain with AROM or PROM in the neck, trunk, or extremities. Posture appropriate   Stable 5/25  Assessment/Plan: 1. Functional deficits which require 3+ hours per day of interdisciplinary therapy in a comprehensive inpatient rehab setting. Physiatrist is providing close team supervision and 24 hour management of active medical problems listed below. Physiatrist and rehab team continue to assess barriers to discharge/monitor patient progress toward functional and medical goals  Care Tool:  Bathing    Body parts bathed by patient: Right arm, Left arm, Chest, Abdomen, Front perineal area, Buttocks, Right upper leg, Left upper leg, Right lower leg, Left lower leg, Face   Body parts bathed by helper: Buttocks, Right lower leg, Left lower leg     Bathing assist Assist Level: Supervision/Verbal cueing     Upper Body Dressing/Undressing Upper body dressing   What is the patient wearing?: Pull over shirt    Upper body assist Assist Level: Supervision/Verbal cueing    Lower Body Dressing/Undressing Lower body dressing      What is the patient wearing?: Underwear/pull up, Pants     Lower body assist Assist for lower body dressing: Set up assist (needs A to  thread foley through clothing)     Toileting Toileting    Toileting assist Assist for toileting: Contact Guard/Touching assist     Transfers Chair/bed transfer  Transfers assist     Chair/bed transfer assist level: Contact Guard/Touching assist     Locomotion Ambulation   Ambulation assist      Assist level: Contact Guard/Touching assist Assistive device: Walker-rolling Max distance: 175   Walk 10 feet activity   Assist     Assist level: Contact Guard/Touching assist Assistive device: Walker-rolling   Walk 50 feet activity   Assist Walk 50 feet with 2 turns activity  did not occur: Safety/medical concerns  Assist level: Contact Guard/Touching assist Assistive device: Walker-rolling    Walk 150 feet activity   Assist Walk 150 feet activity did not occur: Safety/medical concerns  Assist level: Contact Guard/Touching assist Assistive device: Walker-rolling    Walk 10 feet on uneven surface  activity   Assist Walk 10 feet on uneven surfaces activity did not occur: Safety/medical concerns         Wheelchair     Assist Is the patient using a wheelchair?: Yes Type of Wheelchair: Manual    Wheelchair assist level: Supervision/Verbal cueing Max wheelchair distance: 110    Wheelchair 50 feet with 2 turns activity    Assist        Assist Level: Supervision/Verbal cueing   Wheelchair 150 feet activity     Assist      Assist Level: Minimal Assistance - Patient > 75%   Blood pressure 101/72, pulse 60, temperature (!) 97.5 F (36.4 C), temperature source Oral, resp. rate 17, height 5\' 11"  (1.803 m), weight 76.4 kg, SpO2 98%.  Medical Problem List and Plan: 1. Functional deficits secondary to traumatic SDH s/p crani/evacuation x2  -pt also with meningioma s/p resection             -patient may  shower -cover incision/crani             -ELOS/Goals: 9-12 days min A-- 5/27 DC date -Continue CIR PT, OT, and SLP 5-19: Per discharge summary, Decadron  taper was to be stopped; will initiate slow taper over the next 8 days given ongoing blood pressure issues. 5/20: Wife receptive to education. Min-Mod LBD, Mod for ambulation with orthostatics. May downgrade to Min A goals. Per wife ambulation looking pretty close to normal. CGA for transfers and up to 200 ft ambulation with a walker. Mod A cognition.  Discussed that wife would now like to pursue SNF, discussed that SW will be back to discuss on Tuesday  2.  Antithrombotics: -DVT/anticoagulation:  Pharmaceutical: Lovenox  added on 05/15.             -antiplatelet therapy: N/A 3.  Pain Management:  Oxycodone  prn. Pt denies pain 4. Mood/Behavior/Sleep: LCSW to follow for evaluation and support.              -antipsychotic agents: N/A  -pt very reliant on wife for emotional support, decision-making   -  5/20: Sleep log added per request--sleeping well.  5. Neuropsych/cognition: This patient may not be fully capable of making decisions on his own behalf.  5/20: Per wife will have intermittent muteness at baseline; ?underlying dementia vs. Mood/behavioral  6. Skin/Wound Care: Routine pressure relief Mesures.    - 5/22: Per family, Dr. Ellery Guthrie coming in for wound evaluation and suture removal today. Since 12 days post-op, will remove tomorrow if not.   5/23: Suture removal ordered  7. Fluids/Electrolytes/Nutrition: Monitor I/O.  Check CMET in a.              --monitor intake--did not want to eat "wife will feed me"  -10/12/23 BMP stabe, Cr 0.68  -5-19: BUN/creatinine stable, hyponatremia improved  5-23: Good p.o. intakes, however I's and O's remain negative daily.  Will start overnight IV fluids as below.  8. Vasovagal syncope/post ictal aphasia?: On keppra  750 mg BID per Dr. Arora --monitor for orthostatic changes. stopped Flomax  per wife request  -5/16- spoke with wife who has concerns that the increased keppra  dose has affected his mentation. I contacted Dr. Bonnita Buttner who felt that we could go back to the 500mg  bid dose for now and observe.   5/18 discussed prior decrease decrease Keppra  dose 500 mg twice daily with wife  5-19:  Wife requesting EEG or reduction of Keppra ; given no acute concern for seizures, do not feel EEG would be beneficial at this time.  Will discuss with Dr. Arora further dose reduction before initiating.  5-20: Neurology reducing Keppra  to 250 mg twice daily dose per wife's request, after discussion of risks versus benefits.  5/24: continue this dose of keppra   9. Abdominal distension:   had BM yesterday --Hx of sigmoid volvulus 09/2017.    5/16-colonic ileus on KUB yesterday. Don't need another KUB this morning as exam is unchanged--recheck KUB tomorrow -CMET pending -clears only -IVF, IV reglan  -SSE today -(may have to wait for wife to be present to begin these interventions) -5/17 + medium BM today, reports appetite is a little improving, continue current diet, Reglan  and reassess tomorrow -soap suds enema today 5/18 soft diet started, patient's wife feels like Reglan  is over sedating him.  He is tolerating soft diet today.    Discussed with GI Reglan  would not likely provide much benefit for colonic ileus, will discontinue this.   Clinically doing much better. -GI recommended considering Linzess to 290 mcg daily, consider enema tomorrow if not improving.  Wife would like him not to have Linzess, reports his colon is chronically distended due to prior surgery.  She said he always has bowel movements in the evening after drinking water.  Will hold off on Linzess today consider if not improving. 5-19: Producing small soft, soft bowel movements approximately twice daily.  Tolerating p.o. diet.  Continue current regimen, encourage p.o. fluids.  Per wife, despite ongoing colonic distention and large stool burden on x-ray, wishes to not escalate bowel regimen further and requests no further imaging, no GI input/consultation. 5-20: Large bowel movement this afternoon per wife; last bowel movement that afternoon, medium. 5/22: LBM  10. Urinary retention/? CAUTI: Hx of Prostate CA s/p prostatectomy/XRT --Will d/c flomax  per multiple wife's request.  --Continue finasteride  which was added on 05/13 -5/14-can continue foley until ileus is resolved 5/18 wife indicates that she would like to continue Foley catheter for now and follow-up with urology outpatient--discussed considerable risks of this regarding infection, she wishes to continue 5-23: Patient complaining of symptoms of urinary urgency/spasms.  Foley changed out, initial urinalysis  shows large leukocyte esterase and rare bacteria but no nitrites.  Given active symptoms, will start Bactrim 800 mg twice daily for 7 days.  Trend cultures.   - Will hold off on starting Urispas at this time due to history of gastric ileus.  11. Hyponatremia/SIADH: Monitor for now.  -5/17 stable at 131  Stable to slightly improved 132 5-19  5-22: Hyponatremia down to 130 today.  Will get serum osmolality, urine sodium, urine osmolality to  assess.--Indicate mild SIADH, will initiate fluid restriction and overnight normal saline for blood pressure support and sodium supplementation.  Complicated by weaning of glucocorticoids as above.  Na improved to 134, continue IVF  12. PAF: Monitor HR TID--continue amiodarone.  --Not on [facility] per patient, chart review and pharmacy review. -5/17-18 HR stable monitor     10/20/2023   12:55 PM 10/20/2023   11:55 AM 10/20/2023   11:30 AM  Vitals with BMI  Systolic 101 108 213  Diastolic 72 63 65  Pulse 60 57 57    13. Acute blood loss anemia:    -5/16 hgb stable at 10.1 today\  Recheck tomorrow--stable, 9.7  5-23: Wife refusing oral iron supplements, patient not appropriate for IV blood transfusion, hemoglobin stable without signs of active bleeding, not appropriate for IV iron repletion.  Could benefit from dietary education as an outpatient on dark, leafy greens and other high iron foods.  14. H/o LBP radiating to LLE/Neurogenic claudication: Monitor for symptoms with increase in activity.   15.  Hypotension.  - 5-19: Encourage p.o. fluids, added TED hose.  Will avoid binder due to gastrointestinal issues as above.  Later, received IV fluid 1 L for SBP 70s.;  Responsive.  Monitor tomorrow with therapies, may need addition of midodrine.  5-20: Remains with severe orthostatic hypotension after IV fluids yesterday, although with some improvement--per therapies will recover quickly once he is walking.  Add midodrine 2.5 mg twice daily with meals.  Check a.m.  cortisol levels once off of Decadron .  5-21: Continues to refuse a.m. orthostats, did well with therapies this a.m. but refused afternoon sessions due to feelings of fatigue.  Given consistent complaints in the afternoon, will increase midodrine to 3 times daily versus initiate overnight maintenance fluids for blood pressure support; will discuss with wife in a.m.  5/22: Continues to be more fatigued/refusing therapies in the evening.  Blood pressure remains stable but very soft.  Will increase midodrine to 2.5 mg 3 times daily.  If fluid restriction required for SIADH based on labs, may need to do normal saline infusions overnight to support BP--will start 5/22  Hypotensive, midodrine 2.5mg  daily started  LOS: 10 days A FACE TO FACE EVALUATION WAS PERFORMED  Keven Pel Sultana Tierney 10/20/2023, 2:41 PM

## 2023-10-20 NOTE — Plan of Care (Signed)
  Problem: Consults Goal: RH BRAIN INJURY PATIENT EDUCATION Description: Description: See Patient Education module for eduction specifics Outcome: Progressing   Problem: RH BOWEL ELIMINATION Goal: RH STG MANAGE BOWEL WITH ASSISTANCE Description: STG Manage Bowel with supervision Assistance. Outcome: Progressing   Problem: RH BLADDER ELIMINATION Goal: RH STG MANAGE BLADDER WITH ASSISTANCE Description: STG Manage Bladder With supervision Assistance Outcome: Progressing   Problem: RH SKIN INTEGRITY Goal: RH STG SKIN FREE OF INFECTION/BREAKDOWN Description: Manage skin free of infection/breakdown with supervision Outcome: Progressing

## 2023-10-20 NOTE — Progress Notes (Signed)
 Occupational Therapy Session Note  Patient Details  Name: Jesse [name] MRN: [id number] Date of Birth: 09/10/1940  Today's Date: 10/20/2023 OT Individual Time: 1120-1205 OT Individual Time Calculation (min): 45 min    Short Term Goals: Week 1:  OT Short Term Goal 1 (Week 1): Pt will thread LB garments with Min A + LRAD. OT Short Term Goal 1 - Progress (Week 1): Progressing toward goal OT Short Term Goal 2 (Week 1): Pt will complete 1/3 toileting activities with CGA + LRAD. OT Short Term Goal 2 - Progress (Week 1): Progressing toward goal OT Short Term Goal 3 (Week 1): Pt will perform toilet transfer with CGA + LRAD. OT Short Term Goal 3 - Progress (Week 1): Met Week 2:  OT Short Term Goal 1 (Week 2): STG=LTG d/t ELOS  Skilled Therapeutic Interventions/Progress Updates:    1:1 Wife present for session and reported she was getting a tub bench than should arrive today for home.  She is planning to move to a small place but hasn't yet (she may need the tub bench there). She is also interested in her choices for him and would like to know if insurance would cover SNF or in care home - especially if she needs to move in the near future.   Pt reports feeling weak today and tired - he wanted to decline a shower but wife wanted and encouraged him to do it so she could see how it was doing.  BP in sitting before standing was 89/60 (with TEDS) In standing was 102/65 (with TEDS).  Ambulated to the shower with contact guard and sat on the tub bench to doff clothing; Pt able to complete all parts except for TEDS and shoes. Pt does need encouragement to try first before therapist/ wife would help. PT able to shower sit to stand with supervision with grab bar with cues for sequencing and thoroughness.  BP taken after shower without TEDS and it was 108/83 in sitting.  Pt able to don clothing except for TEDS and shoes  - wife reports she has been donning and tying his shoes PTA. Pt returned to recliner to  eat lunch with setup.  Therapy Documentation Precautions:  Precautions Precautions: Fall, Other (comment) Recall of Precautions/Restrictions: Impaired Precaution/Restrictions Comments: L crani, Seizure, HOH, hx of dementia, orthostatic Restrictions Weight Bearing Restrictions Per Provider Order: No    Pain: Pain Assessment Pain Scale: 0-10 Pain Score: 0-No pain    Therapy/Group: Individual Therapy  [facility] 10/20/2023, 12:48 PM

## 2023-10-21 LAB — CBC
HCT: 27.8 % — ABNORMAL LOW (ref 39.0–52.0)
Hemoglobin: 9.3 g/dL — ABNORMAL LOW (ref 13.0–17.0)
MCH: 29.8 pg (ref 26.0–34.0)
MCHC: 33.5 g/dL (ref 30.0–36.0)
MCV: 89.1 fL (ref 80.0–100.0)
Platelets: 199 10*3/uL (ref 150–400)
RBC: 3.12 MIL/uL — ABNORMAL LOW (ref 4.22–5.81)
RDW: 13.8 % (ref 11.5–15.5)
WBC: 4.3 10*3/uL (ref 4.0–10.5)
nRBC: 0 % (ref 0.0–0.2)

## 2023-10-21 LAB — BASIC METABOLIC PANEL WITH GFR
Anion gap: 4 — ABNORMAL LOW (ref 5–15)
Anion gap: 8 (ref 5–15)
BUN: 18 mg/dL (ref 8–23)
BUN: 18 mg/dL (ref 8–23)
CO2: 25 mmol/L (ref 22–32)
CO2: 22 mmol/L (ref 22–32)
Calcium: 8 mg/dL — ABNORMAL LOW (ref 8.9–10.3)
Calcium: 8.4 mg/dL — ABNORMAL LOW (ref 8.9–10.3)
Chloride: 103 mmol/L (ref 98–111)
Chloride: 103 mmol/L (ref 98–111)
Creatinine, Ser: 0.91 mg/dL (ref 0.61–1.24)
Creatinine, Ser: 0.84 mg/dL (ref 0.61–1.24)
GFR, Estimated: >60 mL/min (ref >60)
GFR, Estimated: >60 mL/min (ref >60)
Glucose, Bld: 96 mg/dL (ref 70–99)
Glucose, Bld: 102 mg/dL — ABNORMAL HIGH (ref 70–99)
Potassium: 4.5 mmol/L (ref 3.5–5.1)
Potassium: 4.1 mmol/L (ref 3.5–5.1)
Sodium: 132 mmol/L — ABNORMAL LOW (ref 135–145)
Sodium: 133 mmol/L — ABNORMAL LOW (ref 135–145)

## 2023-10-21 MED ORDER — SODIUM CHLORIDE 1 G PO TABS
1.0000 g | ORAL_TABLET | Freq: Two times a day (BID) | ORAL | Status: DC
  Administered 2023-10-21 – 2023-10-22 (×2): 1 g via ORAL
  Filled 2023-10-21 (×2): qty 1

## 2023-10-21 MED ORDER — SODIUM CHLORIDE 0.9 % IV SOLN: INTRAVENOUS | Status: DC

## 2023-10-21 NOTE — NC FL2 (Signed)
 [place]  MEDICAID FL2 LEVEL OF CARE FORM     IDENTIFICATION  Patient Name: Jesse [name] Birthdate: 05-26-1941 Sex: male Admission Date (Current Location): 10/10/2023  [facility] and [place] Number:  Producer, television/film/video and Address:  [facility], [address] [place], [place] [address]      Provider Number: [id number]  Attending Physician Name and Address:  Bea Lime, DO  Relative Name and Phone Number:  Jeansonne (wife)    Current Level of Care: Hospital Recommended Level of Care: Skilled Nursing Facility Prior Approval Number:    Date Approved/Denied:   PASRR Number: [id number]  Discharge Plan: SNF    Current Diagnoses: Patient Active Problem List   Diagnosis Date Noted   Orthostatic hypotension 10/15/2023   Status post resection of meningioma 10/10/2023   SDH (subdural hematoma) (HCC) 10/10/2023   Intracranial bleed (HCC) 09/30/2023   Brain mass 09/30/2023   Acute encephalopathy 09/30/2023   Hyponatremia 09/30/2023   Hypokalemia 09/30/2023   Hypocalcemia 09/30/2023   Wound drainage 09/10/2022   Vitamin B12 deficiency 08/26/2020   Restless legs 07/22/2019   History of skin cancer 01/12/2019   Erectile dysfunction 01/12/2019   Anxiety 01/12/2019   Volvulus of sigmoid colon s/p flex sig/rectal tube 09/27/2017 09/27/2017   Mild dementia (HCC) 09/27/2017   Chronic constipation 09/27/2017   Osteoarthritis of right hip 06/05/2016   Prostate cancer (HCC) 12/07/2015   Paroxysmal atrial fibrillation (HCC) 12/07/2015   Hypothyroidism 12/07/2015    Orientation RESPIRATION BLADDER Height & Weight     Self, Time, Situation, Place  Normal External catheter Weight: 168 lb 6.9 oz (76.4 kg) Height:  5\' 11"  (180.3 cm)  BEHAVIORAL SYMPTOMS/MOOD NEUROLOGICAL BOWEL NUTRITION STATUS      Incontinent Diet (soft foods; meds swallowed whole)  AMBULATORY STATUS COMMUNICATION OF NEEDS Skin   Limited Assist Verbally Normal                        Personal Care Assistance Level of Assistance  Bathing, Feeding, Dressing Bathing Assistance: Limited assistance Feeding assistance: Independent Dressing Assistance: Limited assistance     Functional Limitations Info  Sight, Hearing, Speech Sight Info: Adequate Hearing Info: Adequate Speech Info: Adequate    SPECIAL CARE FACTORS FREQUENCY  PT (By licensed PT), OT (By licensed OT), Speech therapy     PT Frequency: 5xs per week OT Frequency: 5xs per week     Speech Therapy Frequency: 5xs per week      Contractures Contractures Info: Not present    Additional Factors Info  Code Status, Allergies Code Status Info: Full Allergies Info: See discharge instructions           Current Medications (10/21/2023):  This is the current hospital active medication list Current Facility-Administered Medications  Medication Dose Route Frequency Provider Last Rate Last Admin   acetaminophen  (TYLENOL ) tablet 325-650 mg  325-650 mg Oral Q4H PRN Love, Pamela S, PA-C       alum & mag hydroxide-simeth (MAALOX/MYLANTA) 200-200-20 MG/5ML suspension 30 mL  30 mL Oral Q4H PRN Love, Pamela S, PA-C       bisacodyl  (DULCOLAX) suppository 10 mg  10 mg Rectal Daily PRN Love, Pamela S, PA-C       calcium  carbonate (TUMS - dosed in mg elemental calcium ) chewable tablet 800 mg of elemental calcium   800 mg of elemental calcium  Oral Daily Engler, Morgan C, DO   800 mg of elemental calcium  at 10/21/23 1008  Chlorhexidine  Gluconate Cloth 2 % PADS 6 each  6 each Topical BID Engler, Morgan C, DO   6 each at 10/21/23 2952   dexamethasone  (DECADRON ) tablet 0.5 mg  0.5 mg Oral Daily Engler, Morgan C, DO   0.5 mg at 10/21/23 1009   diphenhydrAMINE  (BENADRYL ) capsule 25 mg  25 mg Oral Q6H PRN Love, Pamela S, PA-C       enoxaparin  (LOVENOX ) injection 40 mg  40 mg Subcutaneous Q24H Love, Pamela S, PA-C   40 mg at 10/20/23 1429   guaiFENesin -dextromethorphan (ROBITUSSIN DM) 100-10 MG/5ML syrup 5-10 mL  5-10 mL Oral  Q6H PRN Love, Pamela S, PA-C       levETIRAcetam  (KEPPRA ) tablet 250 mg  250 mg Oral BID Bhagat, Srishti L, MD   250 mg at 10/21/23 1009   levothyroxine  (SYNTHROID ) tablet 50 mcg  50 mcg Oral Q0600 Love, Pamela S, PA-C   50 mcg at 10/21/23 0516   lidocaine  (XYLOCAINE ) 2 % jelly   Topical PRN Love, Pamela S, PA-C       melatonin tablet 5 mg  5 mg Oral QHS PRN Love, Pamela S, PA-C       midodrine (PROAMATINE) tablet 2.5 mg  2.5 mg Oral Daily Raulkar, Krutika P, MD   2.5 mg at 10/21/23 1009   polyethylene glycol (MIRALAX  / GLYCOLAX ) packet 17 g  17 g Oral Daily Lylia Sand, MD   17 g at 10/21/23 1008   prochlorperazine  (COMPAZINE ) tablet 5-10 mg  5-10 mg Oral Q6H PRN Love, Pamela S, PA-C       Or   prochlorperazine  (COMPAZINE ) suppository 12.5 mg  12.5 mg Rectal Q6H PRN Love, Pamela S, PA-C       Or   prochlorperazine  (COMPAZINE ) injection 5-10 mg  5-10 mg Intravenous Q6H PRN Love, Pamela S, PA-C       senna-docusate (Senokot-S) tablet 2 tablet  2 tablet Oral Q breakfast Zelda Hickman, PA-C   2 tablet at 10/21/23 1009   sodium chloride  flush (NS) 0.9 % injection 3-10 mL  3-10 mL Intravenous Q12H Love, Pamela S, PA-C   10 mL at 10/21/23 1009   sodium chloride  tablet 1 g  1 g Oral BID WC Engler, Morgan C, DO       sodium phosphate  (FLEET) enema 1 enema  1 enema Rectal Once PRN Love, Pamela S, PA-C         Discharge Medications: Please see discharge summary for a list of discharge medications.  Relevant Imaging Results:  Relevant Lab Results:   Additional Information WU#[id number]  [name], LCSW

## 2023-10-21 NOTE — Progress Notes (Signed)
 Speech Language Pathology Daily Session Note  Patient Details  Name: Jesse [name] MRN: [id number] Date of Birth: 08/21/1940  Today's Date: 10/21/2023 SLP Individual Time: 0900-0956 SLP Individual Time Calculation (min): 56 min  Short Term Goals: Week 2: SLP Short Term Goal 1 (Week 2): STGs = LTGs d/t ELOS  Skilled Therapeutic Interventions: Skilled therapy session focused on cognitive and communication goals. SLP facilitated session by prompting patient to complete 5 sets of 5 repetitions of EMST (expiratory muscle strength training). Patient completed exercises set at 30cm H2O. SLP targeted cognitive goals through awareness task. Patient independently aware of medical situation, though required maxA to demonstrate understanding of deficits (cognitive/physical) as he reports ability to complete most tasks independently. SLP continued to target cognitive goals through simple mathematics task. Patient required supervision-minA for addition and modA for subtraction. Patient left in chair with alarm set and call bell in reach. Continue POC.    Pain None reported   Therapy/Group: Individual Therapy  Jullian Clayson M.A., CCC-SLP 10/21/2023, 7:47 AM

## 2023-10-21 NOTE — Progress Notes (Signed)
 PROGRESS NOTE   Subjective/Complaints: No acute complaints. No events overnight. Patient excited about getting cake for his birthday today!  Spoke to his wife regarding medical updates, pursuing SNF to allow transition to more accessible housing.  Heart rate intermittently in the high 50s, otherwise vital stable, BP looks slightly improved maintaining in the low 100s. Sodium remaining stable at 132.  Hemoglobin stable 9-10. Foley draining appropriately.  Last bowel movement 5-25, small.  Continent.  ROS: Limited due to cognitive/behavioral   + Fatigue--consistently later in the day + Orthostasis--stable   Objective:   No results found.  Recent Labs    10/21/23 0608  WBC 4.3  HGB 9.3*  HCT 27.8*  PLT 199    Recent Labs    10/20/23 0651 10/21/23 0608  NA 132* 132*  K 4.3 4.5  CL 105 103  CO2 22 25  GLUCOSE 91 96  BUN 22 18  CREATININE 1.02 0.91  CALCIUM  7.8* 8.0*    Intake/Output Summary (Last 24 hours) at 10/21/2023 0905 Last data filed at 10/21/2023 0601 Gross per 24 hour  Intake 1605.42 ml  Output 3655 ml  Net -2049.58 ml        Physical Exam: Vital Signs Blood pressure 106/64, pulse (!) 58, temperature 98 F (36.7 C), resp. rate 16, height 5\' 11"  (1.803 m), weight 76.4 kg, SpO2 98%.  General: No acute distress, sitting upright in bedside chair . HEENT: left scalp with curvilinear incision with suture, clean and dry.   PERRLA, EOMI, sclera anicteric, oral mucosa pink and moist Neck: Supple without JVD or lymphadenopathy Heart: Reg rate and rhythm. No murmurs rubs or gallops Chest: CTA bilaterally without wheezes, rales, or rhonchi; no distress Abdomen: Moderately distended, hypoactive bowel sounds,, nontender to palpation.  --Consistent with prior exams. Extremities: No clubbing, cyanosis, or edema.  Wearing TED hose. Psych: Pt's affect is flat, appropriate mood. Skin: scalp as above, intact.   Peripheral IV intact. Neuro: patient awake and alert on exam.   Oriented x 3 with increased time for responses. moderate memory deficits and mild concentration deficits--mildly improved today Cranial nerves II through XII intact Moving all 4 extremities 4 out of 5 antigravity against resistance Sensation intact   Musculoskeletal: Full ROM, No pain with AROM or PROM in the neck, trunk, or extremities.    Assessment/Plan: 1. Functional deficits which require 3+ hours per day of interdisciplinary therapy in a comprehensive inpatient rehab setting. Physiatrist is providing close team supervision and 24 hour management of active medical problems listed below. Physiatrist and rehab team continue to assess barriers to discharge/monitor patient progress toward functional and medical goals  Care Tool:  Bathing    Body parts bathed by patient: Right arm, Left arm, Chest, Abdomen, Front perineal area, Buttocks, Right upper leg, Left upper leg, Right lower leg, Left lower leg, Face   Body parts bathed by helper: Buttocks, Right lower leg, Left lower leg     Bathing assist Assist Level: Supervision/Verbal cueing     Upper Body Dressing/Undressing Upper body dressing   What is the patient wearing?: Pull over shirt    Upper body assist Assist Level: Supervision/Verbal cueing    Lower Body  Dressing/Undressing Lower body dressing      What is the patient wearing?: Underwear/pull up, Pants     Lower body assist Assist for lower body dressing: Set up assist (needs A to thread foley through clothing)     Toileting Toileting    Toileting assist Assist for toileting: Contact Guard/Touching assist     Transfers Chair/bed transfer  Transfers assist     Chair/bed transfer assist level: Contact Guard/Touching assist     Locomotion Ambulation   Ambulation assist      Assist level: Contact Guard/Touching assist Assistive device: Walker-rolling Max distance: 175   Walk 10  feet activity   Assist     Assist level: Contact Guard/Touching assist Assistive device: Walker-rolling   Walk 50 feet activity   Assist Walk 50 feet with 2 turns activity did not occur: Safety/medical concerns  Assist level: Contact Guard/Touching assist Assistive device: Walker-rolling    Walk 150 feet activity   Assist Walk 150 feet activity did not occur: Safety/medical concerns  Assist level: Contact Guard/Touching assist Assistive device: Walker-rolling    Walk 10 feet on uneven surface  activity   Assist Walk 10 feet on uneven surfaces activity did not occur: Safety/medical concerns         Wheelchair     Assist Is the patient using a wheelchair?: Yes Type of Wheelchair: Manual    Wheelchair assist level: Supervision/Verbal cueing Max wheelchair distance: 110    Wheelchair 50 feet with 2 turns activity    Assist        Assist Level: Supervision/Verbal cueing   Wheelchair 150 feet activity     Assist      Assist Level: Minimal Assistance - Patient > 75%   Blood pressure 106/64, pulse (!) 58, temperature 98 F (36.7 C), resp. rate 16, height 5\' 11"  (1.803 m), weight 76.4 kg, SpO2 98%.  Medical Problem List and Plan: 1. Functional deficits secondary to traumatic SDH s/p crani/evacuation x2  -pt also with meningioma s/p resection             -patient may  shower -cover incision/crani             -ELOS/Goals: 9-12 days min A-- 5/27 DC date--now SNF -Continue CIR PT, OT, and SLP 5-19: Per discharge summary, Decadron  taper was to be stopped; will initiate slow taper over the next 8 days given ongoing blood pressure issues. 5/20: Wife receptive to education. Min-Mod LBD, Mod for ambulation with orthostatics. May downgrade to Min A goals. Per wife ambulation looking pretty close to normal. CGA for transfers and up to 200 ft ambulation with a walker. Mod A cognition.  5/24: Discussed that wife would now like to pursue SNF--confirmed  today  2.  Antithrombotics: -DVT/anticoagulation:  Pharmaceutical: Lovenox  added on 05/15.             -antiplatelet therapy: N/A 3. Pain Management:  Oxycodone  prn. Pt denies pain 4. Mood/Behavior/Sleep: LCSW to follow for evaluation and support.              -antipsychotic agents: N/A  -pt very reliant on wife for emotional support, decision-making   -  5/20: Sleep log added per request--sleeping well.  5. Neuropsych/cognition: This patient may not be fully capable of making decisions on his own behalf.  5/20: Per wife will have intermittent muteness at baseline; ?underlying dementia vs. Mood/behavioral  6. Skin/Wound Care: Routine pressure relief Mesures.    - 5/22: Per family, Dr. Ellery Guthrie coming in for wound  evaluation and suture removal today. Since 12 days post-op, will remove tomorrow if not.   5/23: Suture removal ordered  7. Fluids/Electrolytes/Nutrition: Monitor I/O. Check CMET in a.              --monitor intake--did not want to eat "wife will feed me"  -10/12/23 BMP stabe, Cr 0.68  -5-19: BUN/creatinine stable, hyponatremia improved  5-23: Good p.o. intakes, however I's and O's remain negative daily.  Will start overnight IV fluids as below.  5-26: P.o.  intakes okay.  Will stop IV fluids overnight, encourage oral intakes.  8. Vasovagal syncope/post ictal aphasia?: On keppra  750 mg BID per Dr. Arora --monitor for orthostatic changes. stopped Flomax  per wife request  -5/16- spoke with wife who has concerns that the increased keppra  dose has affected his mentation. I contacted Dr. Bonnita Buttner who felt that we could go back to the 500mg  bid dose for now and observe.   5/18 discussed prior decrease decrease Keppra  dose 500 mg twice daily with wife  5-19:  Wife requesting EEG or reduction of Keppra ; given no acute concern for seizures, do not feel EEG would be beneficial at this time.  Will discuss with Dr. Arora further dose reduction before initiating.  5-20: Neurology reducing Keppra   to 250 mg twice daily dose per wife's request, after discussion of risks versus benefits.  5/24: continue this dose of keppra   9. Abdominal distension:   had BM yesterday --Hx of sigmoid volvulus 09/2017.   5/16-colonic ileus on KUB yesterday. Don't need another KUB this morning as exam is unchanged--recheck KUB tomorrow -CMET pending -clears only -IVF, IV reglan  -SSE today -(may have to wait for wife to be present to begin these interventions) -5/17 + medium BM today, reports appetite is a little improving, continue current diet, Reglan  and reassess tomorrow -soap suds enema today 5/18 soft diet started, patient's wife feels like Reglan  is over sedating him.  He is tolerating soft diet today.    Discussed with GI Reglan  would not likely provide much benefit for colonic ileus, will discontinue this.   Clinically doing much better. -GI recommended considering Linzess to 290 mcg daily, consider enema tomorrow if not improving.  Wife would like him not to have Linzess, reports his colon is chronically distended due to prior surgery.  She said he always has bowel movements in the evening after drinking water.  Will hold off on Linzess today consider if not improving. 5-19: Producing small soft, soft bowel movements approximately twice daily.  Tolerating p.o. diet.  Continue current regimen, encourage p.o. fluids.  Per wife, despite ongoing colonic distention and large stool burden on x-ray, wishes to not escalate bowel regimen further and requests no further imaging, no GI input/consultation. 5-20: Large bowel movement this afternoon per wife; last bowel movement that afternoon, medium. 5/25: LBM, small  10. Urinary retention/? CAUTI: Hx of Prostate CA s/p prostatectomy/XRT --Will d/c flomax  per multiple wife's request.  --Continue finasteride  which was added on 05/13 -5/14-can continue foley until ileus is resolved 5/18 wife indicates that she would like to continue Foley catheter for now and  follow-up with urology outpatient--discussed considerable risks of this regarding infection, she wishes to continue 5-23: Patient complaining of symptoms of urinary urgency/spasms.  Foley changed out, initial urinalysis shows large leukocyte esterase and rare bacteria but no nitrites.  Given active symptoms, will start Bactrim 800 mg twice daily for 7 days.  Trend cultures.   - Will hold off on starting Urispas at this time  due to history of gastric ileus.  5/26: Cultures positive for Pseudomonas over the weekend--only 40k so will stop Bactrim. No further urinary spasms.   11. Hyponatremia/SIADH: Monitor for now.  -5/17 stable at 131  Stable to slightly improved 132 5-19  5-22: Hyponatremia down to 130 today.  Will get serum osmolality, urine sodium, urine osmolality to assess.--Indicate mild SIADH, will initiate fluid restriction and overnight normal saline for blood pressure support and sodium supplementation.  Complicated by weaning of glucocorticoids as above.  5-26: NA stable at 132 with overnight IV fluid consistently.  Will move to salt tabs today, 1 g twice daily, repeat BMP in 2 days  12. PAF: Monitor HR TID--continue amiodarone.  --Not on [facility] per patient, chart review and pharmacy review. -5/17-18 HR stable monitor     10/21/2023    5:11 AM 10/20/2023    7:50 PM 10/20/2023   12:55 PM  Vitals with BMI  Systolic 106 90 101  Diastolic 64 51 72  Pulse 58 63 60    13. Acute blood loss anemia:    -5/16 hgb stable at 10.1 today\  Recheck tomorrow--stable, 9.7  5-23: Wife refusing oral iron supplements, patient not appropriate for IV blood transfusion, hemoglobin stable without signs of active bleeding, not appropriate for IV iron repletion.  Could benefit from dietary education as an outpatient on dark, leafy greens and other high iron foods.  5/26: HgB remains stable 10-9  14. H/o LBP radiating to LLE/Neurogenic claudication: Monitor for symptoms with increase in activity.   15.   Hypotension.  - 5-19: Encourage p.o. fluids, added TED hose.  Will avoid binder due to gastrointestinal issues as above.  Later, received IV fluid 1 L for SBP 70s.;  Responsive.  Monitor tomorrow with therapies, may need addition of midodrine.  5-20: Remains with severe orthostatic hypotension after IV fluids yesterday, although with some improvement--per therapies will recover quickly once he is walking.  Add midodrine 2.5 mg twice daily with meals.  Check a.m. cortisol levels once off of Decadron .  5-21: Continues to refuse a.m. orthostats, did well with therapies this a.m. but refused afternoon sessions due to feelings of fatigue.  Given consistent complaints in the afternoon, will increase midodrine to 3 times daily versus initiate overnight maintenance fluids for blood pressure support; will discuss with wife in a.m.  5/22: Continues to be more fatigued/refusing therapies in the evening.  Blood pressure remains stable but very soft.  Will increase midodrine to 2.5 mg 3 times daily.  If fluid restriction required for SIADH based on labs, may need to do normal saline infusions overnight to support BP--will start 5/22  5/25: Hypotensive, midodrine 2.5mg  daily started  5.26: Stop overnight IVF. --may need in increase midodrine again but got IVF overnight so will follow  LOS: 11 days A FACE TO FACE EVALUATION WAS PERFORMED  Bea Lime 10/21/2023, 9:05 AM

## 2023-10-21 NOTE — Progress Notes (Signed)
 Physical Therapy TBI Note  Patient Details  Name: Jesse [name] MRN: [id number] Date of Birth: 1941-04-20  Today's Date: 10/21/2023 PT Individual Time: 1105-1202 PT Individual Time Calculation (min): 57 min   Short Term Goals: Week 2:  PT Short Term Goal 1 (Week 2): STGs = LTGs  Skilled Therapeutic Interventions/Progress Updates:     Pt received seated in recliner and agrees to therapy. No complaint of pain. PT has discussion with pt and and wife regarding [place] disposition and possibility of SNF placement. Wife verifies that pt is now planning to DC to SNF. Pt performs sit to stand with CGA and cues for initiation and hand placement. Pt ambulates x100' to dayroom with CGA and RW, with cues for upright gaze to improve balance, and increasing stride length to decrease risk for falls. Pt completes Nustep for endurance training and reciprocal coordination training. Pt completes x12:00 at workload of 5 with average steps per minute ~45. PT provides cues for hand and foot placement and completing full available ROM. Following, pt ambulates x25' with RW and CGA and takes seated rest break. Pt then completes standing activity with mirror for visual feedback. Pt stands with back against wall to promote optimal posture, holding onto RW with BUEs, pt cued to perform high knee marches to challenge balance and provide NMR for large amplitude movements with lower extremities, as pt tends to utilize shuffling gait pattern  Pt completes 2x20 with seated rest break. Pt ambulates back to room with RW and CGA. Pt left seated in recliner with alarm intact and all needs within reach.   Therapy Documentation Precautions:  Precautions Precautions: Fall, Other (comment) Recall of Precautions/Restrictions: Impaired Precaution/Restrictions Comments: L crani, Seizure, HOH, hx of dementia, orthostatic Restrictions Weight Bearing Restrictions Per Provider Order: No   Therapy/Group: Individual Therapy  Neva Barban,  PT ,DPT 10/21/2023, 4:53 PM

## 2023-10-21 NOTE — Plan of Care (Signed)
  Problem: RH BOWEL ELIMINATION Goal: RH STG MANAGE BOWEL WITH ASSISTANCE Description: STG Manage Bowel with supervision Assistance. Outcome: Progressing   Problem: RH BLADDER ELIMINATION Goal: RH STG MANAGE BLADDER WITH ASSISTANCE Description: STG Manage Bladder With supervision Assistance Outcome: Progressing   Problem: RH SKIN INTEGRITY Goal: RH STG SKIN FREE OF INFECTION/BREAKDOWN Description: Manage skin free of infection/breakdown with supervision Outcome: Progressing   Problem: RH SAFETY Goal: RH STG ADHERE TO SAFETY PRECAUTIONS W/ASSISTANCE/DEVICE Description: STG Adhere to Safety Precautions With supervision  Assistance/Device. Outcome: Progressing   Problem: RH PAIN MANAGEMENT Goal: RH STG PAIN MANAGED AT OR BELOW PT'S PAIN GOAL Description: <4 w/ prns Outcome: Progressing

## 2023-10-21 NOTE — Progress Notes (Signed)
 Occupational Therapy Session Note  Patient Details  Name: Jesse [name] MRN: [id number] Date of Birth: May 25, 1941  Today's Date: 10/21/2023 OT Individual Time: 1914-7829 OT Individual Time Calculation (min): 70 min    Short Term Goals: Week 2:  OT Short Term Goal 1 (Week 2): STG=LTG d/t ELOS  Skilled Therapeutic Interventions/Progress Updates:    Pt sitting in the recliner with no c/o pain, agreeable to OT session. He stood with CGA from the recliner and completed 125 ft of functional mobility with CGA to the therapy gym. Patient required increased time for initiation, cuing, rest breaks, and for completion of tasks throughout session. Utilized therapeutic use of self throughout to promote efficiency. Pt completed standing level reciprocal tapping activity with no UE support using a 5 in step 3x15 repetitions. Activity performed to challenge dynamic standing balance and functional activity tolerance to simulate household threshold management and reduce fall risk. Pt required CGA and cueing for upright posture, as well as reduced UE reliance on the RW. Pt completes 3x1 min beach ball volley in seated position with 3 # dowel rod for dynamic balance, postural control, BUE strengthening and endurance required for BADLs and functional transfers. Pt completed blocked practice sit <> stand, 3x6 repetitions with no UE support, to challenge generalized strengthening for ADL transfers, as well as increasing functional activity tolerance and cardiorespiratory endurance. Pt required CGA overall. Next, pt completes 2x10 dowel rod therex for BUE shoulder strengthening required for BADLs/functional transfers as follows with demo cuing and 3-5 # dowel rod (graded as he fatigued). He completed: Shoulder flex/ext and shoulder press. He returned to his room and was left supine with all needs met, bed alarm set.    Therapy Documentation Precautions:  Precautions Precautions: Fall, Other (comment) Recall of  Precautions/Restrictions: Impaired Precaution/Restrictions Comments: L crani, Seizure, HOH, hx of dementia, orthostatic Restrictions Weight Bearing Restrictions Per Provider Order: No Therapy/Group: Individual Therapy  [name] 10/21/2023, 2:21 PM

## 2023-10-21 NOTE — Progress Notes (Signed)
 Patient ID: Jesse [name], male   DOB: May 14, 1941, 83 y.o.   MRN: [id number]  SW met with pt wife who reported concerns about his discharge to home, and options. SW discussed private aide and short SNF rehab. SW explained SNF placement process. She continues to remain concerned about the amount of support he will require. States she will discuss with her husband SNF placement.  SW provided sitter list.   *Wife later came back to report she discussed with her husband and he is in agreement with SNF.   SW met with pt and pt husband in room to confirm SNF placement. SW provided SNF list.   SW sent out SNF referral. Existing [facility]AS[facility]# [id number]   Norval Been, MSW, LCSW Office: [phone] Cell: [phone] Fax: [phone]

## 2023-10-22 ENCOUNTER — Other Ambulatory Visit (HOSPITAL_COMMUNITY): Payer: Self-pay

## 2023-10-22 LAB — BASIC METABOLIC PANEL WITH GFR
Anion gap: 9 (ref 5–15)
BUN: 18 mg/dL (ref 8–23)
CO2: 21 mmol/L — ABNORMAL LOW (ref 22–32)
Calcium: 8.3 mg/dL — ABNORMAL LOW (ref 8.9–10.3)
Chloride: 103 mmol/L (ref 98–111)
Creatinine, Ser: 0.8 mg/dL (ref 0.61–1.24)
GFR, Estimated: >60 mL/min (ref >60)
Glucose, Bld: 123 mg/dL — ABNORMAL HIGH (ref 70–99)
Potassium: 4.1 mmol/L (ref 3.5–5.1)
Sodium: 133 mmol/L — ABNORMAL LOW (ref 135–145)

## 2023-10-22 MED ORDER — SODIUM CHLORIDE 1 G PO TABS
1.0000 g | ORAL_TABLET | Freq: Two times a day (BID) | ORAL | 0 refills | Status: DC
Start: 2023-10-22 — End: 2023-11-25
  Filled 2023-10-22: qty 60, 30d supply, fill #0

## 2023-10-22 MED ORDER — LEVETIRACETAM 250 MG PO TABS
250.0000 mg | ORAL_TABLET | Freq: Two times a day (BID) | ORAL | 0 refills | Status: DC
  Filled 2023-10-22: qty 60, 30d supply, fill #0

## 2023-10-22 MED ORDER — SENNOSIDES-DOCUSATE SODIUM 8.6-50 MG PO TABS
2.0000 | ORAL_TABLET | Freq: Every day | ORAL | 0 refills | Status: DC
  Filled 2023-10-22: qty 60, 30d supply, fill #0

## 2023-10-22 MED ORDER — MIDODRINE HCL 5 MG PO TABS
2.5000 mg | ORAL_TABLET | Freq: Two times a day (BID) | ORAL | Status: DC
Start: 2023-10-22 — End: 2023-10-22
  Administered 2023-10-22: 2.5 mg via ORAL
  Filled 2023-10-22: qty 1

## 2023-10-22 MED ORDER — MIDODRINE HCL 2.5 MG PO TABS
2.5000 mg | ORAL_TABLET | Freq: Two times a day (BID) | ORAL | 0 refills | Status: DC
  Filled 2023-10-22: qty 60, 30d supply, fill #0

## 2023-10-22 MED ORDER — POLYETHYLENE GLYCOL 3350 17 GM/SCOOP PO POWD
17.0000 g | Freq: Every day | ORAL | 0 refills | Status: DC
  Filled 2023-10-22: qty 238, 14d supply, fill #0

## 2023-10-22 MED ORDER — MELATONIN 5 MG PO TABS
5.0000 mg | ORAL_TABLET | Freq: Every evening | ORAL | 0 refills | Status: DC | PRN
  Filled 2023-10-22: qty 30, 30d supply, fill #0

## 2023-10-22 MED ORDER — MIDODRINE HCL 2.5 MG PO TABS
2.5000 mg | ORAL_TABLET | Freq: Every day | ORAL | 0 refills | Status: DC
  Filled 2023-10-22: qty 30, 30d supply, fill #0

## 2023-10-22 NOTE — Progress Notes (Signed)
 Speech Language Pathology Discharge Summary  Patient Details  Name: Jesse [name] MRN: [id number] Date of Birth: Dec 25, 1940  Date of Discharge from SLP service:Oct 22, 2023  Today's Date: 10/22/2023 SLP Individual Time: 1100-1200 SLP Individual Time Calculation (min): 60 min   Skilled Therapeutic Interventions:   Pt and his wife greeted at bedside. He was awake/alert in his recliner upon SLP arrival. He was able to recall events since Friday (last tx session with this SLP) w/ minA to clarify details with wife and SLP's assistance. EMST targeted via EMST75. Resistance increased to 33 cmH2O and he was able to complete 25 reps with only s cues to ensure adequate technique. Of note, improved vocal quality noted throughout tx session and only intermittent repetitions were required. He benefited from minA cues for problem solving/reasoning and maxA cues for intellectual awareness during discussion re continued deficits and OP ST. He then completed a verbal organization task comparing/contrasting common items and he required only minA cues for expansion of responses. Throughout task, he was able to sustain attention for ~12 mins w/ only s cues. At the end of tx tasks, he was left in his recliner with his wife present. See d/c summary below.     Patient has met 5 of 5 long term goals.  Patient to discharge at [facility] level.  Reasons goals not met: n/a   Clinical Impression/Discharge Summary:  Good progress noted overall this stay, as evidenced by mastery of 5/5 LTGs as well as improved memory, attention, problem solving, and processing. Additionally, EMST introduced and pt demonstrates improving vocal intensity. Mild cognitive deficits present at baseline, however, mild to moderate deficits remain at this time. He would benefit from continued ST upon d/c to target remaining cognitive-linguistic deficits, maximize pt independence, and facilitate return to prev roles/responsibilities.   Care Partner:   Caregiver Able to Provide Assistance: Yes  Type of Caregiver Assistance: Cognitive  Recommendation:  Outpatient SLP;24 hour supervision/assistance  Rationale for SLP Follow Up: Maximize cognitive function and independence;Reduce caregiver burden   Equipment: n/a   Reasons for discharge: Discharged from hospital   Patient/Family Agrees with Progress Made and Goals Achieved: Yes    [name] 10/22/2023, 3:45 PM

## 2023-10-22 NOTE — Discharge Summary (Signed)
 Physician Discharge Summary  Patient ID: Jesse [name] MRN: [id number] DOB/AGE: 1940/07/28 83 y.o.  Admit date: 10/10/2023 Discharge date: 10/22/2023  Discharge Diagnoses:  Principal Problem:   SDH (subdural hematoma) (HCC) Active Problems:   Hypothyroidism   Chronic constipation   Status post resection of meningioma   Orthostatic hypotension   Discharged Condition: {condition:18240}  Significant Diagnostic Studies: DG Abd 2 Views Result Date: 10/13/2023 CLINICAL DATA:  Ileus. EXAM: ABDOMEN - 2 VIEW COMPARISON:  Radiograph yesterday FINDINGS: No change in the marked gaseous colonic distension particularly in the left hemiabdomen. Moderate stool is seen in the ascending colon. No convincing small bowel dilatation on the current exam. No evidence of free air. IMPRESSION: No change in marked gaseous colonic distension, typical of colonic ileus. Electronically Signed   By: Chadwick Colonel M.D.   On: 10/13/2023 12:30   DG Abd 1 View Result Date: 10/12/2023 CLINICAL DATA:  Follow-up ileus. EXAM: ABDOMEN - 1 VIEW COMPARISON:  10/10/2023 FINDINGS: Diffuse colonic dilatation shows no significant change compared to prior study. No dilated small bowel loops seen. These findings are consistent with severe colonic ileus. IMPRESSION: No significant change in severe colonic ileus. Electronically Signed   By: Marlyce Sine M.D.   On: 10/12/2023 10:43   DG Abd 1 View Result Date: 10/10/2023 CLINICAL DATA:  Abdominal distension EXAM: ABDOMEN - 1 VIEW COMPARISON:  09/30/2017 FINDINGS: Two supine frontal views of the abdomen and pelvis are obtained. There is marked diffuse gaseous distention of the colon most compatible with adynamic ileus. No radiographic findings to suggest volvulus. No evidence of bowel obstruction. No masses or abnormal calcifications. No acute bony abnormalities. IMPRESSION: 1. Marked diffuse gaseous distention of the colon, most consistent with adynamic ileus. No radiographic evidence of  recurrent sigmoid volvulus. Electronically Signed   By: Bobbye Burrow M.D.   On: 10/10/2023 19:48   MR BRAIN W WO CONTRAST Result Date: 10/09/2023 CLINICAL DATA:  Acute neurologic deficit EXAM: MRI HEAD WITHOUT AND WITH CONTRAST TECHNIQUE: Multiplanar, multiecho pulse sequences of the brain and surrounding structures were obtained without and with intravenous contrast. CONTRAST:  7.5mL GADAVIST  GADOBUTROL  1 MMOL/ML IV SOLN COMPARISON:  09/30/2023 FINDINGS: Brain: Status post resection of anterior left convexity meningioma. There are blood products throughout the resection cavity. No residual contrast enhancing mass. Left convexity subdural hematoma measures 7 mm in thickness. 2 mm posterior right convexity subdural hematoma. There is moderate edema within the anterior left frontal white matter. Generalized volume loss. There is rightward midline shift of approximately 9 mm, improved. Vascular: Normal flow voids. Skull and upper cervical spine: Normal calvarium and skull base. Visualized upper cervical spine and soft tissues are normal. Sinuses/Orbits:No paranasal sinus fluid levels or advanced mucosal thickening. No mastoid or middle ear effusion. Normal orbits. IMPRESSION: 1. Status post resection of anterior left convexity meningioma. No residual contrast enhancing mass. 2. Left convexity subdural hematoma measures 7 mm in thickness. 2 mm posterior right convexity subdural hematoma. 3. Rightward midline shift of approximately 9 mm, improved. Electronically Signed   By: Juanetta Nordmann M.D.   On: 10/09/2023 22:35   EEG adult Result Date: 10/09/2023 Arleene Lack, MD     10/09/2023  5:42 PM Patient Name: Jesse [name] MRN: [id number] Epilepsy Attending: Arleene Lack Referring Physician/Provider: Imogene Mana, NP Date: 10/09/2023 Duration: 22.16 mins Patient history: 82yo m with ams. EEG to evaluate for seizure Level of alertness: Awake AEDs during EEG study: LEV Technical aspects: This EEG study was done  with  scalp electrodes positioned according to the 10-20 International system of electrode placement. Electrical activity was reviewed with band pass filter of 1-70Hz , sensitivity of 7 uV/mm, display speed of 71mm/sec with a 60Hz  notched filter applied as appropriate. EEG data were recorded continuously and digitally stored.  Video monitoring was available and reviewed as appropriate. Description: The posterior dominant rhythm consists of 8 Hz activity of moderate voltage (25-35 uV) seen predominantly in posterior head regions, asymmetric ( left<right) and reactive to eye opening and eye closing. EEG showed continuous 3 to 6 Hz theta-delta slowing admixed with 12-14hz  beta activity in left hemisphere. Hyperventilation and photic stimulation were not performed.   ABNORMALITY - Continuous slow, left hemisphere IMPRESSION: This study is suggestive of cortical dysfunction arising from left hemisphere likely secondary to underlying structural abnormality/ SDH. No seizures or epileptiform discharges were seen throughout the recording. Priyanka Suzanne Erps   CT ANGIO HEAD NECK W WO CM (CODE STROKE) Result Date: 10/09/2023 CLINICAL DATA:  Neuro deficit, concern for stroke, slurred speech. EXAM: CT ANGIOGRAPHY HEAD AND NECK WITH AND WITHOUT CONTRAST TECHNIQUE: Multidetector CT imaging of the head and neck was performed using the standard protocol during bolus administration of intravenous contrast. Multiplanar CT image reconstructions and MIPs were obtained to evaluate the vascular anatomy. Carotid stenosis measurements (when applicable) are obtained utilizing NASCET criteria, using the distal internal carotid diameter as the denominator. RADIATION DOSE REDUCTION: This exam was performed according to the departmental dose-optimization program which includes automated exposure control, adjustment of the mA and/or kV according to patient size and/or use of iterative reconstruction technique. CONTRAST:  75mL OMNIPAQUE  IOHEXOL  350  MG/ML SOLN COMPARISON:  Same day CT head.  MRA head 09/30/2023. FINDINGS: CTA NECK FINDINGS Aortic arch: Four vessel configuration of the aortic arch. Imaged portion shows no evidence of aneurysm or dissection. Mild atherosclerosis. No significant stenosis of the major arch vessel origins. Pulmonary arteries: As permitted by contrast timing, there are no filling defects in the visualized pulmonary arteries. Subclavian arteries: The subclavian arteries are patent bilaterally. Right carotid system: No evidence of dissection, stenosis (50% or greater), or occlusion. Bulky calcified atherosclerosis along the proximal cervical ICA without hemodynamically significant stenosis. Left carotid system: No evidence of dissection, stenosis (50% or greater), or occlusion. Mild atherosclerosis at the carotid bifurcation without stenosis. Vertebral arteries: Right vertebral artery is dominant. Atherosclerosis at the left vertebral artery origin. The vessel origin is somewhat obscured due to artifact. There is likely severe stenosis at the origin of the left vertebral artery. The vertebral arteries are patent from the origins to the vertebrobasilar confluence. No evidence of dissection. Skeleton: Postsurgical changes of the calvarium. No acute or aggressive finding. Sequelae of laminectomy from C4-C6. Degenerative changes in the visualized spine. Other neck: The visualized airway is patent. No cervical lymphadenopathy. Upper chest: Visualized lung apices are clear. Review of the MIP images confirms the above findings CTA HEAD FINDINGS ANTERIOR CIRCULATION: The intracranial ICAs are patent bilaterally. Atherosclerosis of the bilateral carotid siphons. Mild stenosis of the left cavernous ICA. 1.5 mm prominence along the right supraclinoid ICA at the origin of the posterior communicating artery. No high-grade stenosis, proximal occlusion, or vascular malformation. MCAs: The right M1 segment is patent. Severe stenosis of an M2 superior  vision branch of the right MCA. The left M1 segment is patent. Left MCA branches are patent. ACAs: Patent bilaterally. Accounting for differences in modality, there is slightly decreased mass effect on distal ACA branches per compared to prior MRA. POSTERIOR CIRCULATION: No  significant stenosis, proximal occlusion, aneurysm, or vascular malformation. PCAs: Patent bilaterally. The left PCA appears primarily supplied by the posterior communicating artery with contribution from a small P1 segment noted. Pcomm: Visualized bilaterally, larger on the left. SCAs: The superior cerebellar arteries are patent bilaterally. Basilar artery: Patent AICAs: None visualized on the right. PICAs: Visualized bilaterally, larger on the left. Vertebral arteries: The intracranial vertebral arteries are patent. Venous sinuses: As permitted by contrast timing, patent. Anatomic variants: None Review of the MIP images confirms the above findings IMPRESSION: No large vessel occlusion. 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 Accounting for differences in modality there is slightly decreased mass effect on the distal ACA branches compared to prior MRA. 1.5 mm outpouching along the right supraclinoid ICA at the origin of the posterior communicating artery which may reflect an infundibular origin versus less likely small aneurysm. Atherosclerosis in the neck as above without high-grade stenosis. Focal severe stenosis at the origin of the non dominant left vertebral artery on the aortic arch. Electronically Signed   By: Denny Flack M.D.   On: 10/09/2023 13:13   CT HEAD CODE STROKE WO CONTRAST Result Date: 10/09/2023 CLINICAL DATA:  Code stroke. Neuro deficit, concern for stroke. Slurred speech. EXAM: CT HEAD WITHOUT CONTRAST TECHNIQUE: Contiguous axial images were obtained from the base of the skull through the vertex without intravenous contrast. RADIATION DOSE REDUCTION: This exam was performed according to the  departmental dose-optimization program which includes automated exposure control, adjustment of the mA and/or kV according to patient size and/or use of iterative reconstruction technique. COMPARISON:  CT head 10/07/2023 and earlier. FINDINGS: Brain: Left frontotemporal craniotomy. Postoperative changes from resection of right frontal lobe mass and evacuation of parenchymal hematoma. Scattered foci of residual parenchymal hemorrhage in the left frontal lobe are overall similar to prior. Minimally increased edema within the left frontal lobe. Redemonstrated mixed attenuation subdural collection over the left frontal lobe measuring up to 11 mm in thickness similar to prior. Additional small focus of subdural hemorrhage over the right parietal lobe measuring to 3 mm. Additional subdural hemorrhage along the posterior falx measuring up to 3 mm in thickness, similar to prior. No new or enlarging foci of intracranial hemorrhage. Slightly decreased pneumocephalus. Mass effect is overall similar to prior with partial effacement of the left lateral ventricle. Trace blood products within the right occipital horn. Approximately 9 mm rightward midline shift not significantly changed from prior. Vascular: No hyperdense vessel or unexpected calcification. Skull: Postsurgical changes without acute or aggressive finding. Sinuses/Orbits: No acute finding. Other: Mastoid air cells are clear. ASPECTS [facility] Stroke Program Early CT Score) - Ganglionic level infarction (caudate, lentiform nuclei, internal capsule, insula, M1-M3 cortex): 7 - Supraganglionic infarction (M4-M6 cortex): 3 Total score (0-10 with 10 being normal): 10 IMPRESSION: 1. Redemonstrated postsurgical changes of left frontotemporal craniotomy for resection of left frontal lobe mass and parenchymal hematoma evacuation. 2. Similar appearance of multi compartment intracranial hemorrhage. No new or enlarging foci of intracranial hemorrhage. 3. Similar mass effect with  approximately 9 mm rightward midline shift. 4. ASPECTS is 10 These results were communicated to Dr. Arora at 12:38 pm on 10/09/2023 by text page via the [facility] messaging system. Electronically Signed   By: Denny Flack M.D.   On: 10/09/2023 12:38   CT HEAD WO CONTRAST ( ) Result Date: 10/07/2023 CLINICAL DATA:  Provided history: Check resolution of shift. EXAM: CT HEAD WITHOUT CONTRAST TECHNIQUE: Contiguous axial images were obtained from the base of  the skull through the vertex without intravenous contrast. RADIATION DOSE REDUCTION: This exam was performed according to the departmental dose-optimization program which includes automated exposure control, adjustment of the mA and/or kV according to patient size and/or use of iterative reconstruction technique. COMPARISON:  Prior head CT examinations 10/04/2023 and earlier. Brain MRI 09/30/2023. FINDINGS: Brain: Continued interval evolution of postoperative changes from recent prior resection of a left frontal mass and from subsequent left frontal parenchymal hematoma evacuation. Small patchy foci of parenchymal hemorrhage within the left frontal lobe (at site of prior left frontal mass resection) are non-progressed. Edema at this site has slightly progressed. Pneumocephalus persistent although decreased pneumocephalus scattered along the left greater than right frontal lobes. Residual mixed density subdural hematoma along the left cerebral convexity, measuring up to 11 mm in thickness (for instance as seen on series 5, image 50). Subdural hemorrhage along the right cerebral convexity measuring up to 3 mm in thickness, present on the prior head CT of 10/04/2023 but better appreciated on today's study (for instance as seen on series 5, image 25). Persistent mass effect with partial effacement of the left lateral ventricle. Rightward midline shift now measures 7-8 mm (previously 9 mm). Persistent trace hemorrhage within the occipital horn of the right lateral  ventricle. Unchanged size and configuration of the ventricular system. Subdural hemorrhage along the mid and posterior falx, measuring up to 3 mm in thickness, similar to the prior CT. Vascular: No hyperdense vessel.  Atherosclerotic calcifications. Skull: Left-sided cranioplasty. Sinuses/Orbits: No orbital mass or acute orbital finding. No significant paranasal sinus disease. IMPRESSION: 1. Continued interval evolution of postoperative changes from recent left frontal mass resection, and subsequent left frontal parenchymal hematoma evacuation. 2. Patchy foci of parenchymal hemorrhage within the left frontal lobe, non-progressed. Surrounding parenchymal edema has slightly progressed. 3. Residual mixed density subdural hematoma along the left cerebral convexity, measuring up to 11 mm in thickness. 4. Persistent mass effect with partial effacement of the left lateral ventricle. Decreased rightward midline shift now measuring 7-8 mm (previously 9 mm). 5. Thin subdural hematoma along the right cerebral convexity (measuring up to 3 mm in thickness), non-progressed. 6. Trace hemorrhage within the right lateral ventricle occipital horn, unchanged. 7. Subdural hemorrhage along the mid and posterior falx (measuring up to 3 mm in thickness), also not significantly changed. Electronically Signed   By: Bascom Lily D.O.   On: 10/07/2023 15:01   CT HEAD WO CONTRAST ( ) Result Date: 10/04/2023 CLINICAL DATA:  Follow-up status post craniotomy. EXAM: CT HEAD WITHOUT CONTRAST TECHNIQUE: Contiguous axial images were obtained from the base of the skull through the vertex without intravenous contrast. RADIATION DOSE REDUCTION: This exam was performed according to the departmental dose-optimization program which includes automated exposure control, adjustment of the mA and/or kV according to patient size and/or use of iterative reconstruction technique. COMPARISON:  CT head earlier same day, MRI head 09/30/2023. FINDINGS: Brain:  Postsurgical changes of right frontotemporal craniotomy. Interval increase in pneumocephalus over the left frontal lobe and new component of pneumocephalus over the anterior right frontal lobe and right temporal lobe. Mixed attenuation subdural collection now measures up to 10 mm in thickness with decreased component of hyperattenuating acute blood products compared to earlier same day CT. Region of patchy parenchymal hemorrhage is significantly decreased from prior with few scattered areas of hemorrhage noted the largest of which measures up to 12 mm. Similar edema in the left frontal lobe. There is decreased mass effect compared to prior now with up to 9  mm of rightward midline shift, previously 14 mm. Additional component of subdural hemorrhage posteriorly along the falx measuring up to 3 mm in thickness likely reflecting slight redistribution of blood products. Decreased mass effect on the left lateral ventricle. Slightly decreased caliber of the right lateral ventricle. Decreased mass effect on the third ventricle also noted. Trace blood products within the right occipital horn. Vascular: Atherosclerotic calcifications of the carotid siphons and intracranial vertebral arteries. No hyperdense vessel. Skull: Postsurgical changes without acute or aggressive finding. Sinuses/Orbits: Orbits are symmetric. No significant mucosal thickening in the paranasal sinuses. Other: Mastoid air cells are clear. Interval placement of surgical drain within the left frontoparietal scalp with tip overlying the anterior aspect of craniotomy site. There is slightly increased intramuscular emphysema over the left frontotemporal scalp. IMPRESSION: Interval evacuation of intraparenchymal hemorrhage in the left frontal lobe. Scattered residual foci of parenchymal hemorrhage the largest of which measures up to 12 mm. Decreased mass effect now with 9 mm midline shift, previously 14 mm. Decreased subdural hematoma over the left cerebral  convexity. Component of subdural hemorrhage along the posterior falx is minimally increased measuring up to 3 mm likely reflecting redistribution of blood products. Improved caliber of the ventricles as above. Trace blood products now noted in the right occipital horn. Increased pneumocephalus as above. Electronically Signed   By: Denny Flack M.D.   On: 10/04/2023 16:45   CT HEAD WO CONTRAST ( ) Result Date: 10/04/2023 CLINICAL DATA:  Weakness and altered mental status EXAM: CT HEAD WITHOUT CONTRAST TECHNIQUE: Contiguous axial images were obtained from the base of the skull through the vertex without intravenous contrast. RADIATION DOSE REDUCTION: This exam was performed according to the departmental dose-optimization program which includes automated exposure control, adjustment of the mA and/or kV according to patient size and/or use of iterative reconstruction technique. COMPARISON:  Head CT and brain MRI from 4 days prior FINDINGS: Brain: Recent resection of left frontal mass. Subdural gas and hemorrhage measuring up to 11 mm in thickness. There is also patchy parenchymal hemorrhage at the level of prior left frontal mass effect, left frontal hemorrhage measuring up to 4 cm in length on coronal reformats. Midline shift measures 14 mm, mildly increased from preoperative. No detected infarct. Unchanged degree of right lateral ventriculomegaly. Vascular: Negative Skull: Unremarkable left-sided craniotomy. Sinuses/Orbits: Negative Other: Case discussed with Dr. Ellery Guthrie. IMPRESSION: Blood products in the left frontal lobe and spreading along the left subdural space, subdural collection measuring up to 11 mm in thickness. Midline shift is now 14 mm, mildly increased from preoperative exam. Electronically Signed   By: Ronnette Coke M.D.   On: 10/04/2023 06:55   MR BRAIN W WO CONTRAST Result Date: 09/30/2023 CLINICAL DATA:  Brain/CNS neoplasm, staging EXAM: MRI HEAD WITHOUT AND WITH CONTRAST TECHNIQUE:  Multiplanar, multiecho pulse sequences of the brain and surrounding structures were obtained without and with intravenous contrast. CONTRAST:  8mL GADAVIST  GADOBUTROL  1 MMOL/ML IV SOLN COMPARISON:  MRI head September 04, 2017. FINDINGS: Brain: Large (6.1 x 4.8 x 4.7 cm) enhancing and heterogeneous/hemorrhagic extra-axial dural-based mass along the left frontal convexity. Surrounding edema and mass effect with up to 1.3 cm rightward midline shift. Dural thickening and fluid collections along the left greater than right cerebral convexities, measuring up to 6 mm on the left and trace on the right. No evidence of acute infarct or hydrocephalus. Vascular: Major arterial flow voids are maintained skull base. Skull and upper cervical spine: Normal marrow signal. Sinuses/Orbits: Clear sinuses.  No acute orbital  findings. IMPRESSION: 1. 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 Less likely considerations include hemangiopericytoma or dural metastasis. This may represent interval growth of the lesion seen on 2019 MRI. 2. Resulting 1.3 cm of rightward midline shift. 3. Left larger than right (6 mm in thickness on the left) cerebral convexity subdural collections, most likely subdural hematomas. Electronically Signed   By: Stevenson Elbe M.D.   On: 09/30/2023 19:55   EEG adult Result Date: 09/30/2023 Arleene Lack, MD     09/30/2023  4:49 PM Patient Name: Jesse [name] MRN: [id number] Epilepsy Attending: Arleene Lack Referring Physician/Provider: Lena Qualia, MD Date: 09/30/2023 Duration: 25.54 mins Patient history: 83yo M with left frontal extra axial lesion. EEG to evaluate for seizure Level of alertness: Awake AEDs during EEG study: None Technical aspects: This EEG study was done with scalp electrodes positioned according to the 10-20 International system of electrode placement. Electrical activity was reviewed with band pass filter of 1-70Hz ,  sensitivity of 7 uV/mm, display speed of 15mm/sec with a 60Hz  notched filter applied as appropriate. EEG data were recorded continuously and digitally stored.  Video monitoring was available and reviewed as appropriate. Description: The posterior dominant rhythm consists of 9 Hz activity of moderate voltage (25-35 uV) seen predominantly in posterior head regions, symmetric and reactive to eye opening and eye closing. EEG showed continuous 3 to 6 Hz theta-delta slowing in left fronto-centro-temporal region. Hyperventilation and photic stimulation were not performed.   ABNORMALITY - Continuous slow, left fronto-centro-temporal region IMPRESSION: This study is suggestive of cortical dysfunction arising from left fronto-centro-temporal region likely secondary to underlying structural abnormality. No seizures or epileptiform discharges were seen throughout the recording. Priyanka Suzanne Erps   MR ANGIO HEAD WO CONTRAST Result Date: 09/30/2023 CLINICAL DATA:  Neuro deficit, concern for stroke. EXAM: MRA HEAD WITHOUT CONTRAST TECHNIQUE: Angiographic images of the Circle of Willis were acquired using MRA technique without intravenous contrast. COMPARISON:  Earlier same day CT head.  MRI head 09/04/2017. FINDINGS: Anterior circulation: The intracranial internal carotid arteries are patent and appear normal in caliber. No evidence of high-grade stenosis. The M1 segment of the right MCA is patent. Right MCA bifurcation is patent. Patent M2 branches of the right MCA. Distal right MCA branches are patent. The left M1 segment is patent. Normal appearance of the left MCA bifurcation. Left M2 branches are patent. Distal left MCA branches are patent. The A1 and A2 segments are patent bilaterally. Distal ACA branches are patent. There is subtle mass effect on A4 and A5 branches bilaterally due to large extra-axial mass over the left frontal lobe. Posterior circulation: The visualized intracranial vertebral arteries are patent. Right  vertebral artery is dominant. The basilar artery is patent. Posterior communicating arteries noted bilaterally. The PCAs are patent bilaterally. The superior cerebellar arteries are patent bilaterally. AICA visualized on the right. PICA visualized bilaterally, more pronounced on the left. Anatomic variants: None significant. Other: Redemonstrated large extra-axial mass overlying the left frontal lobe. Left subdural collection better evaluated on same day head CT. IMPRESSION: Patent intracranial arterial vasculature. Mild mass effect on A4 and A5 branches of the ACA is secondary to large extra-axial mass over the left frontal lobe. No high-grade stenosis. Electronically Signed   By: Denny Flack M.D.   On: 09/30/2023 13:24   CT HEAD WO CONTRAST Addendum Date: 09/30/2023 ADDENDUM REPORT: 09/30/2023 03:26 ADDENDUM: Findings discussed with Dr. Monique Ano via telephone at 3:26 a.m. Electronically  Signed   By: Stevenson Elbe M.D.   On: 09/30/2023 03:26   Result Date: 09/30/2023 CLINICAL DATA:  Polytrauma, blunt; Polytrauma, penetrating EXAM: CT HEAD WITHOUT CONTRAST CT MAXILLOFACIAL WITHOUT CONTRAST CT CERVICAL SPINE WITHOUT CONTRAST TECHNIQUE: Multidetector CT imaging of the head, cervical spine, and maxillofacial structures were performed using the standard protocol without intravenous contrast. Multiplanar CT image reconstructions of the cervical spine and maxillofacial structures were also generated. RADIATION DOSE REDUCTION: This exam was performed according to the departmental dose-optimization program which includes automated exposure control, adjustment of the mA and/or kV according to patient size and/or use of iterative reconstruction technique. COMPARISON:  MRI September 04, 2017 FINDINGS: CT HEAD FINDINGS Brain: Large (approximately 6.5 cm) heterogeneous and hemorrhagic extra-axial mass along the left frontal convexity with significant mass effect and approximately 1.5 cm of rightward midline shift. Approximately  7 mm thick predominately predominantly low-density subdural fluid collection along the left cerebral convexity. Small amount of hyperdensity within the collection. No evidence of acute large vascular territory infarct or hydrocephalus. Vascular: Calcific atherosclerosis. Skull: No acute fracture. Other: No mastoid effusions. CT MAXILLOFACIAL FINDINGS Osseous: No fracture or mandibular dislocation. No destructive process. Orbits: Negative. No traumatic or inflammatory finding. Sinuses: Mostly clear. Soft tissues: Negative. CT CERVICAL SPINE FINDINGS Alignment: No substantial sagittal subluxation. Skull base and vertebrae: No acute fracture. Soft tissues and spinal canal: No prevertebral fluid or swelling. No visible canal hematoma. Disc levels:  Mild for age multilevel degenerative change. Upper chest: Clear sinuses. IMPRESSION: 1. Large (approximately 6.5 cm) heterogeneous and hemorrhagic extra-axial mass along the left frontal convexity with 1.5 cm of rightward midline shift, likely significant increase in size of the lesion seen on 2019 MRI. Recommend MRI head with contrast and neurosurgery consultation. 2. Approximately 7 mm thick predominately predominantly low-density subdural fluid collection along the left cerebral convexity, likely chronic hematoma. Small amount of hyperdensity within the collection is suggestive of acute/recent hemorrhage. 3. No evidence of acute fracture or traumatic malalignment in the cervical spine. Electronically Signed: By: Stevenson Elbe M.D. On: 09/30/2023 03:15   CT Maxillofacial Wo Contrast Addendum Date: 09/30/2023 ADDENDUM REPORT: 09/30/2023 03:26 ADDENDUM: Findings discussed with Dr. Monique Ano via telephone at 3:26 a.m. Electronically Signed   By: Stevenson Elbe M.D.   On: 09/30/2023 03:26   Result Date: 09/30/2023 CLINICAL DATA:  Polytrauma, blunt; Polytrauma, penetrating EXAM: CT HEAD WITHOUT CONTRAST CT MAXILLOFACIAL WITHOUT CONTRAST CT CERVICAL SPINE WITHOUT CONTRAST  TECHNIQUE: Multidetector CT imaging of the head, cervical spine, and maxillofacial structures were performed using the standard protocol without intravenous contrast. Multiplanar CT image reconstructions of the cervical spine and maxillofacial structures were also generated. RADIATION DOSE REDUCTION: This exam was performed according to the departmental dose-optimization program which includes automated exposure control, adjustment of the mA and/or kV according to patient size and/or use of iterative reconstruction technique. COMPARISON:  MRI September 04, 2017 FINDINGS: CT HEAD FINDINGS Brain: Large (approximately 6.5 cm) heterogeneous and hemorrhagic extra-axial mass along the left frontal convexity with significant mass effect and approximately 1.5 cm of rightward midline shift. Approximately 7 mm thick predominately predominantly low-density subdural fluid collection along the left cerebral convexity. Small amount of hyperdensity within the collection. No evidence of acute large vascular territory infarct or hydrocephalus. Vascular: Calcific atherosclerosis. Skull: No acute fracture. Other: No mastoid effusions. CT MAXILLOFACIAL FINDINGS Osseous: No fracture or mandibular dislocation. No destructive process. Orbits: Negative. No traumatic or inflammatory finding. Sinuses: Mostly clear. Soft tissues: Negative. CT CERVICAL SPINE FINDINGS Alignment: No substantial  sagittal subluxation. Skull base and vertebrae: No acute fracture. Soft tissues and spinal canal: No prevertebral fluid or swelling. No visible canal hematoma. Disc levels:  Mild for age multilevel degenerative change. Upper chest: Clear sinuses. IMPRESSION: 1. Large (approximately 6.5 cm) heterogeneous and hemorrhagic extra-axial mass along the left frontal convexity with 1.5 cm of rightward midline shift, likely significant increase in size of the lesion seen on 2019 MRI. Recommend MRI head with contrast and neurosurgery consultation. 2. Approximately 7 mm  thick predominately predominantly low-density subdural fluid collection along the left cerebral convexity, likely chronic hematoma. Small amount of hyperdensity within the collection is suggestive of acute/recent hemorrhage. 3. No evidence of acute fracture or traumatic malalignment in the cervical spine. Electronically Signed: By: Stevenson Elbe M.D. On: 09/30/2023 03:15   CT Cervical Spine Wo Contrast Addendum Date: 09/30/2023 ADDENDUM REPORT: 09/30/2023 03:26 ADDENDUM: Findings discussed with Dr. Monique Ano via telephone at 3:26 a.m. Electronically Signed   By: Stevenson Elbe M.D.   On: 09/30/2023 03:26   Result Date: 09/30/2023 CLINICAL DATA:  Polytrauma, blunt; Polytrauma, penetrating EXAM: CT HEAD WITHOUT CONTRAST CT MAXILLOFACIAL WITHOUT CONTRAST CT CERVICAL SPINE WITHOUT CONTRAST TECHNIQUE: Multidetector CT imaging of the head, cervical spine, and maxillofacial structures were performed using the standard protocol without intravenous contrast. Multiplanar CT image reconstructions of the cervical spine and maxillofacial structures were also generated. RADIATION DOSE REDUCTION: This exam was performed according to the departmental dose-optimization program which includes automated exposure control, adjustment of the mA and/or kV according to patient size and/or use of iterative reconstruction technique. COMPARISON:  MRI September 04, 2017 FINDINGS: CT HEAD FINDINGS Brain: Large (approximately 6.5 cm) heterogeneous and hemorrhagic extra-axial mass along the left frontal convexity with significant mass effect and approximately 1.5 cm of rightward midline shift. Approximately 7 mm thick predominately predominantly low-density subdural fluid collection along the left cerebral convexity. Small amount of hyperdensity within the collection. No evidence of acute large vascular territory infarct or hydrocephalus. Vascular: Calcific atherosclerosis. Skull: No acute fracture. Other: No mastoid effusions. CT MAXILLOFACIAL  FINDINGS Osseous: No fracture or mandibular dislocation. No destructive process. Orbits: Negative. No traumatic or inflammatory finding. Sinuses: Mostly clear. Soft tissues: Negative. CT CERVICAL SPINE FINDINGS Alignment: No substantial sagittal subluxation. Skull base and vertebrae: No acute fracture. Soft tissues and spinal canal: No prevertebral fluid or swelling. No visible canal hematoma. Disc levels:  Mild for age multilevel degenerative change. Upper chest: Clear sinuses. IMPRESSION: 1. Large (approximately 6.5 cm) heterogeneous and hemorrhagic extra-axial mass along the left frontal convexity with 1.5 cm of rightward midline shift, likely significant increase in size of the lesion seen on 2019 MRI. Recommend MRI head with contrast and neurosurgery consultation. 2. Approximately 7 mm thick predominately predominantly low-density subdural fluid collection along the left cerebral convexity, likely chronic hematoma. Small amount of hyperdensity within the collection is suggestive of acute/recent hemorrhage. 3. No evidence of acute fracture or traumatic malalignment in the cervical spine. Electronically Signed: By: Stevenson Elbe M.D. On: 09/30/2023 03:15    Labs:  Basic Metabolic Panel: Recent Labs  Lab 10/17/23 0649 10/18/23 0620 10/19/23 0738 10/20/23 0651 10/21/23 0608 10/21/23 1003  NA 130* 132* 134* 132* 132* 133*  K 4.3 4.3 4.5 4.3 4.5 4.1  CL 101 101 102 105 103 103  CO2 24 24 25 22 25 22   GLUCOSE 97 98 100* 91 96 102*  BUN 20 21 18 22 18 18   CREATININE 0.76 0.75 0.87 1.02 0.91 0.84  CALCIUM  8.4* 8.1* 8.2* 7.8*  8.0* 8.4*    CBC: Recent Labs  Lab 10/17/23 0649 10/21/23 0608  WBC 8.8 4.3  HGB 10.2* 9.3*  HCT 30.2* 27.8*  MCV 89.6 89.1  PLT 231 199    CBG: No results for input(s): "GLUCAP" in the last 168 hours.  Brief HPI:   Jesse [name] is a 83 y.o. male ***   Hospital Course: Jesse [name] was admitted to rehab 10/10/2023 for inpatient therapies to consist of  PT, ST and OT at least three hours five days a week. Past admission physiatrist, therapy team and rehab RN have worked together to provide customized collaborative inpatient rehab.   Blood pressures were monitored on TID basis and   Diabetes has been monitored with ac/hs CBG checks and SSI was use prn for tighter BS control.    Rehab course: During patient's stay in rehab weekly team conferences were held to monitor patient's progress, set goals and discuss barriers to discharge. At admission, patient required  He/She  has had improvement in activity tolerance, balance, postural control as well as ability to compensate for deficits. He/She has had improvement in functional use RUE/LUE  and RLE/LLE as well as improvement in awareness       Disposition:  There are no questions and answers to display.         Diet:  Special Instructions:  Discharge Instructions     Ambulatory referral to Occupational Therapy   Complete by: As directed    Eval and treat   Ambulatory referral to Physical Medicine Rehab   Complete by: As directed    Ambulatory referral to Physical Therapy   Complete by: As directed    Eval and treat   Ambulatory referral to Speech Therapy   Complete by: As directed    Eval and treat      Allergies as of 10/22/2023       Reactions   Flomax  [tamsulosin ]    Significant hypotension        Medication List     STOP taking these medications    ceFAZolin  2-4 GM/100ML-% IVPB Commonly known as: ANCEF    docusate sodium  100 MG capsule Commonly known as: COLACE   finasteride  5 MG tablet Commonly known as: PROSCAR    oxyCODONE  5 MG immediate release tablet Commonly known as: Oxy IR/ROXICODONE    pantoprazole  40 MG tablet Commonly known as: PROTONIX    senna 8.6 MG Tabs tablet Commonly known as: SENOKOT       TAKE these medications    acetaminophen  325 MG tablet Commonly known as: TYLENOL  Take 2 tablets (650 mg total) by mouth every 6 (six)  hours as needed for mild pain (pain score 1-3). What changed: reasons to take this   Calcium  Antacid 500 MG chewable tablet Generic drug: calcium  carbonate Chew 4 tablets (800 mg of elemental calcium  total) by mouth daily.   levETIRAcetam  250 MG tablet Commonly known as: KEPPRA  Take 1 tablet (250 mg total) by mouth 2 (two) times daily. What changed:  medication strength how much to take   melatonin 5 MG Tabs Take 1 tablet (5 mg total) by mouth at bedtime as needed.   midodrine 2.5 MG tablet Commonly known as: PROAMATINE Take 1 tablet (2.5 mg total) by mouth daily. Start taking on: Oct 23, 2023   polyethylene glycol 17 g packet Commonly known as: MIRALAX  / GLYCOLAX  Take 17 g by mouth daily. What changed:  when to take this reasons to take this   senna-docusate 8.6-50 MG tablet  Commonly known as: Senokot-S Take 2 tablets by mouth daily with breakfast. Start taking on: Oct 23, 2023   sodium chloride  1 g tablet Take 1 tablet (1 g total) by mouth 2 (two) times daily with a meal.   Synthroid  50 MCG tablet Generic drug: levothyroxine  TAKE 1 TABLET DAILY BEFORE BREAKFAST   VITAMIN B 12 PO Take 1,000 Units by mouth 3 (three) times a week. Notes to patient: Resume at home        Follow-up Information     Rodney Clamp, MD Follow up.   Specialty: Family Medicine Why: Call in 1-2 days for post hospital follow up Contact information: [address] [place] [place] [place] [address] [phone]         Elna Haggis, MD Follow up.   Specialty: Neurosurgery Why: Call in 1-2 days for post hospital follow up Contact information: [address] [place] [address] [phone]         Cherri Corns C, DO Follow up.   Specialty: Physical Medicine and Rehabilitation Why: office will call you with follow up appointment Contact information: [address] [place] [address] [phone]         [facility]  Follow up.   Contact information: [address] [place]  [address] [phone]                Signed: Zelda Hickman 10/22/2023, 8:57 AM

## 2023-10-22 NOTE — Progress Notes (Signed)
 Inpatient Rehabilitation Discharge Medication Review by a Pharmacist  A complete drug regimen review was completed for this patient to identify any potential clinically significant medication issues.  High Risk Drug Classes Is patient taking? Indication by Medication  Antipsychotic No   Anticoagulant No   Antibiotic No   Opioid No   Antiplatelet No   Hypoglycemics/insulin No   Vasoactive Medication Yes Midodrine - low BP  Chemotherapy No   Other No Levetiracetam  - seizures Melatonin - sleep Synthroid  - low thyroid   Sodium chloride  - supplement     Type of Medication Issue Identified Description of Issue Recommendation(s)  Drug Interaction(s) (clinically significant)     Duplicate Therapy     Allergy     No Medication Administration End Date     Incorrect Dose     Additional Drug Therapy Needed     Significant med changes from prior encounter (inform family/care partners about these prior to discharge).    Other       Clinically significant medication issues were identified that warrant physician communication and completion of prescribed/recommended actions by midnight of the next day:  No  Name of provider notified for urgent issues identified:   Provider Method of Notification:     Pharmacist comments: None  Time spent performing this drug regimen review (minutes):  20 minutes  Thank you. Lennice Quivers, PharmD

## 2023-10-22 NOTE — Plan of Care (Signed)
  Problem: RH Problem Solving Goal: LTG Patient will demonstrate problem solving for (SLP) Description: LTG:  Patient will demonstrate problem solving for basic/complex daily situations with cues  (SLP) Outcome: Completed/Met   Problem: RH Memory Goal: LTG Patient will use memory compensatory aids to (SLP) Description: LTG:  Patient will use memory compensatory aids to recall biographical/new, daily complex information with cues (SLP) Outcome: Completed/Met   Problem: RH Attention Goal: LTG Patient will demonstrate this level of attention during functional activites (SLP) Description: LTG:  Patient will will demonstrate this level of attention during functional activites (SLP) Outcome: Completed/Met   Problem: RH Awareness Goal: LTG: Patient will demonstrate awareness during functional activites type of (SLP) Description: LTG: Patient will demonstrate awareness during functional activites type of (SLP) Outcome: Completed/Met   Problem: RH Pre-functional/Other (Specify) Goal: RH LTG SLP (Specify) 1 Description: RH LTG SLP (Specify) 1 Outcome: Completed/Met

## 2023-10-22 NOTE — Plan of Care (Signed)
  Problem: Consults Goal: RH BRAIN INJURY PATIENT EDUCATION Description: Description: See Patient Education module for eduction specifics Outcome: Progressing   Problem: RH BOWEL ELIMINATION Goal: RH STG MANAGE BOWEL WITH ASSISTANCE Description: STG Manage Bowel with supervision Assistance. Outcome: Progressing   Problem: RH BLADDER ELIMINATION Goal: RH STG MANAGE BLADDER WITH ASSISTANCE Description: STG Manage Bladder With supervision Assistance Outcome: Progressing   Problem: RH SKIN INTEGRITY Goal: RH STG SKIN FREE OF INFECTION/BREAKDOWN Description: Manage skin free of infection/breakdown with supervision Outcome: Progressing   Problem: RH SAFETY Goal: RH STG ADHERE TO SAFETY PRECAUTIONS W/ASSISTANCE/DEVICE Description: STG Adhere to Safety Precautions With  supervision Assistance/Device. Outcome: Progressing   Problem: RH COGNITION-NURSING Goal: RH STG USES MEMORY AIDS/STRATEGIES W/ASSIST TO PROBLEM SOLVE Description: STG Uses Memory Aids/Strategies With supervision Assistance to Problem Solve. Outcome: Progressing   Problem: RH PAIN MANAGEMENT Goal: RH STG PAIN MANAGED AT OR BELOW PT'S PAIN GOAL Description: <4 w/ prns Outcome: Progressing   Problem: RH KNOWLEDGE DEFICIT BRAIN INJURY Goal: RH STG INCREASE KNOWLEDGE OF SELF CARE AFTER BRAIN INJURY Description: Manage increase knowledge of self care after brain injury with supervision assistance from wife using educational materials provided Outcome: Progressing

## 2023-10-22 NOTE — Progress Notes (Signed)
 Wife educated on foley care; foley bag care per nurse  Handouts given to wife ; no further questions noted.

## 2023-10-22 NOTE — Progress Notes (Signed)
 Physical Therapy Discharge Summary  Patient Details  Name: Jesse [name] MRN: [id number] Date of Birth: [birth year]  Date of Discharge from PT service:Oct 22, 2023  Today's Date: 10/22/2023 PT Individual Time: 1503-1600 PT Individual Time Calculation (min): 57 min    Patient has met 9 of 9 long term goals due to improved activity tolerance, improved balance, improved postural control, increased strength, improved attention, and improved awareness.  Patient to discharge at an ambulatory level Supervision.   Patient's care partner is independent to provide the necessary physical and cognitive assistance at discharge.  Reasons goals not met: NA  Recommendation:  Patient will benefit from ongoing skilled PT services in outpatient setting to continue to advance safe functional mobility, address ongoing impairments in balance, ambulation, strength, endurance, and minimize fall risk.  Equipment: No equipment provided  Reasons for discharge: treatment goals met and discharge from hospital  Patient/family agrees with progress made and goals achieved: Yes  Skilled Therapeutic Interventions: Pt received supine in bed asleep. Awakens to verbal stimuli and agrees to therapy. No complaint of pain. Supine to sit with cues for sequencing and positioning. Pt performs stand step transfer form bed to [facility] with cues for hand placement, body mechanics, and sequencing. WC transport to gym for time management. Pt completes ramp navigation and car transfer with RW and cues for sequencing. Seated rest break. Pt ambulates x175' with RW and cues for upright gaze to improve balance, and increasing stride length and gait speed to decrease risk for falls. Following rest break, pt ambulates to stairs and completes x12 6" steps with bilateral handrails and close supervision, with cues for step sequencing and safety. Seated rest break. PT provides pt and wife with OTAGO balance exercise program, and answers questions and  concerns regarding HEP. Pt stands and picks object off ground with RW and cues for body mechanics, then ambulates x350' with RW and same cues. Left seated on EOB with alarm intact and all needs within reach   PT Discharge Precautions/Restrictions Precautions Precautions: Fall;Other (comment) Precaution/Restrictions Comments: L crani, Seizure, HOH, hx of dementia, orthostatic Restrictions Weight Bearing Restrictions Per Provider Order: No Pain Interference Pain Interference Pain Effect on Sleep: 1. Rarely or not at all Pain Interference with Therapy Activities: 1. Rarely or not at all Pain Interference with Day-to-Day Activities: 1. Rarely or not at all Vision/Perception  Vision - History Ability to See in Adequate Light: 0 Adequate Perception Perception: Within Functional Limits Praxis Praxis: WFL  Cognition Overall Cognitive Status: History of cognitive impairments - at baseline Arousal/Alertness: Awake/alert Orientation Level: Oriented X4 Year: 2025 Month: May Day of Week: Correct Attention: Focused;Sustained Focused Attention: Appears intact Sustained Attention: Appears intact Sustained Attention Impairment: Verbal complex;Functional complex Selective Attention: Appears intact Memory: Impaired Memory Impairment: Decreased short term memory Decreased Short Term Memory: Verbal complex;Functional complex;Functional basic Awareness: Impaired Awareness Impairment: Emergent impairment Problem Solving: Impaired Problem Solving Impairment: Verbal complex;Functional complex Executive Function: Civil engineer, contracting: Impaired Initiating: Impaired Self Monitoring: Impaired Self Correcting: Impaired Safety/Judgment: Appears intact Sensation Sensation Light Touch: Appears Intact Hot/Cold: Appears Intact Proprioception: Appears Intact Coordination Gross Motor Movements are Fluid and Coordinated: No Fine Motor Movements are Fluid and  Coordinated: Yes Coordination and Movement Description: Deficits due generalized deconditioning/debility Motor  Motor Motor: Abnormal postural alignment and control  Mobility Bed Mobility Bed Mobility: Sit to Supine;Supine to Sit Supine to Sit: Independent with assistive device Sit to Supine: Independent with assistive device Transfers Transfers: Sit to Stand;Stand to [employer] Transfers Sit  to Stand: Supervision/Verbal cueing Stand to Sit: Supervision/Verbal cueing Stand Pivot Transfers: Supervision/Verbal cueing Transfer (Assistive device): Rolling walker Locomotion  Gait Ambulation: Yes Gait Assistance: Supervision/Verbal cueing Gait Distance (Feet): 350 Feet Assistive device: Rolling walker Gait Assistance Details: Verbal cues for sequencing;Verbal cues for technique;Verbal cues for precautions/safety Gait Gait: Yes Gait Pattern: Impaired Gait Pattern: Shuffle;Trunk flexed Gait velocity: decreased Stairs / Additional Locomotion Stairs: Yes Stairs Assistance: Supervision/Verbal cueing Stair Management Technique: Two rails Number of Stairs: 12 Height of Stairs: 6 Ramp: Supervision/Verbal cueing Curb: Supervision/Verbal cueing Wheelchair Mobility Wheelchair Mobility: No  Trunk/Postural Assessment  Cervical Assessment Cervical Assessment: Exceptions to [facility] (forward head) Thoracic Assessment Thoracic Assessment: Exceptions to [facility] (rounded shoulders) Lumbar Assessment Lumbar Assessment: Exceptions to [facility] Postural Control Postural Control: Deficits on evaluation Trunk Control: Decreased Righting Reactions: Decreased/delayed Protective Responses: Decreased/delayed  Balance Balance Balance Assessed: Yes Static Sitting Balance Static Sitting - Balance Support: Feet supported Static Sitting - Level of Assistance: 6: Modified independent (Device/Increase time) Dynamic Sitting Balance Dynamic Sitting - Balance Support: Feet supported Dynamic Sitting - Level of  Assistance: 6: Modified independent (Device/Increase time) Static Standing Balance Static Standing - Balance Support: During functional activity;Bilateral upper extremity supported Static Standing - Level of Assistance: 5: Stand by assistance Dynamic Standing Balance Dynamic Standing - Balance Support: Bilateral upper extremity supported;During functional activity Dynamic Standing - Level of Assistance: 5: Stand by assistance Extremity Assessment  RLE Assessment RLE Assessment: Exceptions to [facility] General Strength Comments: Grossly 4/5 LLE Assessment LLE Assessment: Exceptions to [facility] General Strength Comments: Grossly 4/5   Neva Barban, PT, DPT 10/22/2023, 5:27 PM

## 2023-10-22 NOTE — Progress Notes (Addendum)
 Occupational Therapy Discharge Summary  Patient Details  Name: CARLEE TESFAYE MRN: [id number] Date of Birth: April 04, 1941  Date of Discharge from OT service:Oct 22, 2023   Patient has met 5 of 6 long term goals due to improved activity tolerance, improved balance, postural control, ability to compensate for deficits, improved attention, improved awareness, and improved coordination.  Patient to discharge at overall Supervision level.  Patient's care partner is independent to provide the necessary cognitive assistance at discharge. Family education completed with pt's wife over several sessions. She feels comfortable taking him home. He is at a (S) level overall for transfers and ADLs, other than min A for toileting and LB dressing.   Goals not met: Pt still requires min A for toileting tasks. His wife is okay to provide this assist at home.  Recommendation:  Patient will benefit from ongoing skilled OT services in outpatient setting to continue to advance functional skills in the area of BADL and iADL.  Equipment: No equipment provided  Reasons for discharge: treatment goals met and discharge from hospital  Patient/family agrees with progress made and goals achieved: Yes  OT Discharge Precautions/Restrictions  Precautions Precautions: Fall;Other (comment) Precaution/Restrictions Comments: L crani, Seizure, HOH, hx of dementia, orthostatic Restrictions Weight Bearing Restrictions Per Provider Order: No  ADL ADL Eating: Supervision/safety Where Assessed-Eating: Bed level Grooming: Supervision/safety Where Assessed-Grooming: Sitting at sink Upper Body Bathing: Supervision/safety Where Assessed-Upper Body Bathing: Edge of bed Lower Body Bathing: Supervision/safety Where Assessed-Lower Body Bathing: Edge of bed Upper Body Dressing: Supervision/safety Where Assessed-Upper Body Dressing: Edge of bed Lower Body Dressing: Minimal assistance Where Assessed-Lower Body Dressing: Edge of  bed Toileting: Minimal assistance Where Assessed-Toileting: Teacher, adult education: Close supervision Toilet Transfer Method: Surveyor, minerals: Gaffer: Unable to Engineer, technical sales: Close supervision Vision Baseline Vision/History: 1 Wears glasses Patient Visual Report: No change from baseline Vision Assessment?: No apparent visual deficits Perception  Perception: Within Functional Limits Praxis Praxis: WFL Cognition Cognition Overall Cognitive Status: History of cognitive impairments - at baseline Arousal/Alertness: Awake/alert Orientation Level: Person;Place;Situation Person: Oriented Place: Oriented Situation: Oriented Memory: Impaired Memory Impairment: Decreased short term memory Decreased Short Term Memory: Verbal complex;Functional complex Attention: Selective Selective Attention: Appears intact Awareness: Appears intact Problem Solving: Impaired Problem Solving Impairment: Functional complex;Verbal complex Safety/Judgment: Appears intact Brief Interview for Mental Status (BIMS) Repetition of Three Words (First Attempt): 3 Temporal Orientation: Year: Correct Temporal Orientation: Month: Accurate within 5 days Temporal Orientation: Day: Correct Recall: "Sock": No, could not recall Recall: "Blue": Yes, no cue required Recall: "Bed": Yes, no cue required BIMS Summary Score: 13 Sensation Sensation Light Touch: Appears Intact Hot/Cold: Appears Intact Proprioception: Appears Intact Coordination Gross Motor Movements are Fluid and Coordinated: No Fine Motor Movements are Fluid and Coordinated: Yes Coordination and Movement Description: Deficits due generalized deconditioning/debility Motor  Motor Motor: Abnormal postural alignment and control Mobility  Bed Mobility Bed Mobility: Sit to Supine;Supine to Sit Supine to Sit: Independent with assistive device Sit to Supine: Independent with assistive  device Transfers Sit to Stand: Supervision/Verbal cueing Stand to Sit: Supervision/Verbal cueing  Trunk/Postural Assessment  Cervical Assessment Cervical Assessment: Exceptions to [facility] (forward head) Thoracic Assessment Thoracic Assessment: Exceptions to [facility] (rounded shoulders) Lumbar Assessment Lumbar Assessment: Exceptions to [facility] Postural Control Postural Control: Deficits on evaluation Trunk Control: Decreased Righting Reactions: Decreased/delayed Protective Responses: Decreased/delayed  Balance Balance Balance Assessed: Yes Static Sitting Balance Static Sitting - Balance Support: Feet supported Static Sitting - Level of Assistance: 6: Modified  independent (Device/Increase time) Dynamic Sitting Balance Dynamic Sitting - Balance Support: Feet supported Dynamic Sitting - Level of Assistance: 6: Modified independent (Device/Increase time) Static Standing Balance Static Standing - Balance Support: During functional activity;Bilateral upper extremity supported Static Standing - Level of Assistance: 5: Stand by assistance Dynamic Standing Balance Dynamic Standing - Balance Support: Bilateral upper extremity supported;During functional activity Dynamic Standing - Level of Assistance: 5: Stand by assistance Extremity/Trunk Assessment RUE Assessment RUE Assessment: Exceptions to [facility] Active Range of Motion (AROM) Comments: Limted shoulder flexion- baseline General Strength Comments: 3+/5 LUE Assessment LUE Assessment: Exceptions to [facility] Active Range of Motion (AROM) Comments: Limited shoulder flexion General Strength Comments: 3-/5 grossly   [name] 10/22/2023, 10:15 AM

## 2023-10-22 NOTE — Patient Care Conference (Cosign Needed Addendum)
 Inpatient RehabilitationTeam Conference and Plan of Care Update Date: 10/22/2023   Time: 1011 am    Patient Name: Jesse [name]      Medical Record Number: [id number]  Date of Birth: [birth year] Sex: Male         Room/Bed: 4W11C/4W11C-01 Payor Info: Payor: MEDICARE / Plan: MEDICARE PART A AND B / Product Type: *No Product type* /    Admit Date/Time:  10/10/2023 12:46 PM  Primary Diagnosis:  SDH (subdural hematoma) [facility])  Hospital Problems: Principal Problem:   SDH (subdural hematoma) (HCC) Active Problems:   Hypothyroidism   Chronic constipation   Status post resection of meningioma   Orthostatic hypotension    Expected Discharge Date: Expected Discharge Date: 10/22/23  Team Members Present: Physician leading conference: Dr. Cherri Corns Social Worker Present: Norval Been, LCSW Nurse Present: Jerene Monks, RN PT Present: Theodis Fiscal, PT OT Present: Eilene Grater, OT SLP Present: Tally Faes, SLP PPS Coordinator present : Jestine Moron, SLP     Current Status/Progress Goal Weekly Team Focus  Bowel/Bladder   pt with folys catheter 16 Fr , urine color yellow straw clear ,good urine output ,  Contient for Bowel last BM 10/21/2023  Medium wdl characteristics   avoid CAUTI ,peri care , make sure pt oral hydrated ,avoid constipation , pt to be independent for bowel and bladder status   intake and output / encoruge oral intake /peri care    Swallow/Nutrition/ Hydration   soft/thin           ADL's   (S) UB ADLs, min A LB dressing, (S) transfers overall. Still limited by fatigue and endurance   Supervision   ADLs, transfers, endurance, cog retraining, d/c planning    Mobility   supervision bed mobility, transfers, and ambulation without RW   supervision  DC prep    Communication   use of EMST at 30cm H2O            Safety/Cognition/ Behavioral Observations  modA, maxA for awareness   minA, modA for awareness   awareness of deficits, simple problem  solving, attention, STM, processing speed    Pain   comfortable  no any complains   keept pt off pain   offer prn, and address pain    Skin   healing frontal area old wound   skin care , no wounds or skin issues  skin care , avoid bed ulcer or skin breakdown      Discharge Planning:  Pt is now SNF placement. D/c pending preference and bed offer. SW will confirm there are no barriers to discharge.    Team Discussion: Patient was admitted post Craniotomy/evacuation due to traumatic SDH. Patient has meningioma s/ p resection. Patient has sigmoid volvulus and follows home regimen that works. Patient has hypotension: medications adjusted by MD. Patient limited by cognitive deficits, fatigue and poor endurance.   Patient on target to meet rehab goals: yes, Currently patient supervision with upper body care, minimal assist with lower body care. Patient needs supervision with transfers and able to ambulate with supervision without a rolling walker. Patient will be going home with foley catheter: wife has been educated with foley care and foley bag care. Overall goals at discharge are set for supervision assistance.   *See Care Plan and progress notes for long and short-term goals.   Revisions to Treatment Plan:  EMST   Teaching Needs: Safety, medications, toileting, transfers, foley care, foley bag care, incision care, etc    Current Barriers  to Discharge: Decreased caregiver support, Home enviroment access/layout, and foley catheter   Possible Resolutions to Barriers: Family education Out patient follow-up DME: wife has all the equipment needed     Medical Summary Current Status: medically complicated by urinary retention, bladder spasms, SIADH, hypotension, chronic bowel distention, cognitive deficits, and p A fib  Barriers to Discharge: Behavior/Mood;Cardiac Complications;Hypotension;Medical stability;Self-care education;Inadequate Nutritional Intake;Incontinence   Possible  Resolutions to [employer]: support blood pressure with fluids and midodrine, adjust medications and provide cognitive stimulation to improve cognition, encourage p.o. intakes, follow ups with Urology and neurology as OP, lab monitorring for SIADH with salt tabs   Continued Need for Acute Rehabilitation Level of Care: The patient requires daily medical management by a physician with specialized training in physical medicine and rehabilitation for the following reasons: Direction of a multidisciplinary physical rehabilitation program to maximize functional independence : Yes Medical management of patient stability for increased activity during participation in an intensive rehabilitation regime.: Yes Analysis of laboratory values and/or radiology reports with any subsequent need for medication adjustment and/or medical intervention. : Yes   I attest that I was present, lead the team conference, and concur with the assessment and plan of the team.   Farron Lafond Gayo 10/22/2023, 1011 am

## 2023-10-22 NOTE — Progress Notes (Signed)
 Occupational Therapy Session Note  Patient Details  Name: Jesse [name] MRN: [id number] Date of Birth: 1940/08/21  Today's Date: 10/22/2023 OT Individual Time: [phone] OT Individual Time Calculation (min): 73 min    Short Term Goals: Week 2:  OT Short Term Goal 1 (Week 2): STG=LTG d/t ELOS  Skilled Therapeutic Interventions/Progress Updates:   Pt supine with no c/o pain at rest. Extended time spent on d/c planning pt's wife is now reporting she is comfortable with taking pt home today and wants to cancel SNF placement. Discussed CLOF, OT POC, and f/u therapy. Pt will meet some of his goals and she is okay providing the min A needed for toileting and LB dressing. He came to EOB with mod I using bed rail. He completed UB bathing and dressing with (S). LB dressing with min A. LB bathing with (S). Skilled monitoring of vitals throughout session to ensure hemodynamic and cardiorespiratory stability. Vitals listed below. Discussed BP management at home with his wife and ADLs/IADLs that involve static standing and put pt at risk for orthostatic hypotension and therefore falls. He then completed 120 ft of functional mobility to the therapy gym with the RW at (S) level overall. Pt completed several variations of a standing level reciprocal tapping activity. Activity performed to challenge dynamic standing balance and functional activity tolerance to simulate household threshold management and reduce fall risk. He initially had UE support and his target was a cone to challenge more dynamic balance/hip flexor control. 3x20 repetitions.The next trial was using a 6 in step with no UE support to challenge stability in single leg stance-min A required. 3x20 repetitions. He required frequent rest breaks d/t fatigue. He returned to his room, 100 ft of functional mobility with the RW at (S) level. He was left supine with all needs met.   BP EOB no teds on: 97/61, HR 68 BP seated: 111/63 HR 71 BP standing EOB 0 min:   90/60, HR 81   Provided the following HEP with a level 1 resistance band Access Code: [email or web address] URL: [email or web address] Date: 10/22/2023 Prepared by: Eilene Grater  Exercises - Seated Shoulder Horizontal Abduction with Resistance  - 1 x daily - 7 x weekly - 3 sets - 10 reps - Seated Shoulder Diagonal Pulls with Resistance  - 1 x daily - 7 x weekly - 3 sets - 10 reps - Seated Shoulder Flexion with Self-Anchored Resistance  - 1 x daily - 7 x weekly - 3 sets - 10 reps - Seated Elbow Flexion with Self-Anchored Resistance  - 1 x daily - 7 x weekly - 3 sets - 10 reps  Therapy Documentation Precautions:  Precautions Precautions: Fall, Other (comment) Recall of Precautions/Restrictions: Impaired Precaution/Restrictions Comments: L crani, Seizure, HOH, hx of dementia, orthostatic Restrictions Weight Bearing Restrictions Per Provider Order: No  Therapy/Group: Individual Therapy  [name] 10/22/2023, 8:04 AM

## 2023-10-22 NOTE — Progress Notes (Signed)
 PROGRESS NOTE   Subjective/Complaints: No acute complaints. No events overnight. Patient did have small reduction in blood pressure yesterday afternoon into the 80s systolic, asymptomatic. Patient doing well at a supervision to contact-guard level, with consistent assistance from his wife.  Requesting to change plans from SNF and go home today.  Patient's wife agreeable with this plan.  ROS: Limited due to cognitive/behavioral   + Fatigue--consistently later in the day + Orthostasis--stable   Objective:   No results found.  Recent Labs    10/21/23 0608  WBC 4.3  HGB 9.3*  HCT 27.8*  PLT 199    Recent Labs    10/21/23 1003 10/22/23 0919  NA 133* 133*  K 4.1 4.1  CL 103 103  CO2 22 21*  GLUCOSE 102* 123*  BUN 18 18  CREATININE 0.84 0.80  CALCIUM  8.4* 8.3*    Intake/Output Summary (Last 24 hours) at 10/22/2023 1454 Last data filed at 10/22/2023 1100 Gross per 24 hour  Intake 800 ml  Output 2600 ml  Net -1800 ml        Physical Exam: Vital Signs Blood pressure (!) 102/57, pulse 61, temperature 97.7 F (36.5 C), resp. rate 20, height 5\' 11"  (1.803 m), weight 76.4 kg, SpO2 98%.  General: No acute distress, sitting up in therapy gym. HEENT: Left craniectomy site stable appearance.  PERRLA, EOMI, sclera anicteric, oral mucosa pink and moist Neck: Supple without JVD or lymphadenopathy Heart: Reg rate and rhythm. No murmurs rubs or gallops Chest: CTA bilaterally without wheezes, rales, or rhonchi; no distress Abdomen: Mildly distended, hypoactive bowel sounds,, nontender to palpation.  --Consistent with prior exams. Extremities: No clubbing, cyanosis, or edema.  Wearing TED hose. Psych: Pt's affect is flat, appropriate mood. Skin: Surgical incision C/d/I.  Sutures removed. Neuro: patient awake and alert, and oriented to self, place, and partially to time. moderate memory deficits and mild concentration  deficits--stable Cranial nerves II through XII intact Moving all 4 extremities 5- out of 5 antigravity against resistance Sensation intact No appreciable coordination deficits No appreciable tone  Musculoskeletal: Full ROM, No pain with AROM or PROM in the neck, trunk, or extremities.    Assessment/Plan: 1. Functional deficits which require 3+ hours per day of interdisciplinary therapy in a comprehensive inpatient rehab setting. Physiatrist is providing close team supervision and 24 hour management of active medical problems listed below. Physiatrist and rehab team continue to assess barriers to discharge/monitor patient progress toward functional and medical goals  Care Tool:  Bathing    Body parts bathed by patient: Right arm, Left arm, Chest, Abdomen, Front perineal area, Buttocks, Right upper leg, Left upper leg, Right lower leg, Left lower leg, Face   Body parts bathed by helper: Buttocks, Right lower leg, Left lower leg     Bathing assist Assist Level: Supervision/Verbal cueing     Upper Body Dressing/Undressing Upper body dressing   What is the patient wearing?: Pull over shirt    Upper body assist Assist Level: Supervision/Verbal cueing    Lower Body Dressing/Undressing Lower body dressing      What is the patient wearing?: Underwear/pull up, Pants     Lower body assist Assist for  lower body dressing: Set up assist (needs A to thread foley through clothing)     Toileting Toileting    Toileting assist Assist for toileting: Contact Guard/Touching assist     Transfers Chair/bed transfer  Transfers assist     Chair/bed transfer assist level: Contact Guard/Touching assist     Locomotion Ambulation   Ambulation assist      Assist level: Contact Guard/Touching assist Assistive device: Walker-rolling Max distance: 175   Walk 10 feet activity   Assist     Assist level: Contact Guard/Touching assist Assistive device: Walker-rolling   Walk 50  feet activity   Assist Walk 50 feet with 2 turns activity did not occur: Safety/medical concerns  Assist level: Contact Guard/Touching assist Assistive device: Walker-rolling    Walk 150 feet activity   Assist Walk 150 feet activity did not occur: Safety/medical concerns  Assist level: Contact Guard/Touching assist Assistive device: Walker-rolling    Walk 10 feet on uneven surface  activity   Assist Walk 10 feet on uneven surfaces activity did not occur: Safety/medical concerns         Wheelchair     Assist Is the patient using a wheelchair?: Yes Type of Wheelchair: Manual    Wheelchair assist level: Supervision/Verbal cueing Max wheelchair distance: 110    Wheelchair 50 feet with 2 turns activity    Assist        Assist Level: Supervision/Verbal cueing   Wheelchair 150 feet activity     Assist      Assist Level: Minimal Assistance - Patient > 75%   Blood pressure (!) 102/57, pulse 61, temperature 97.7 F (36.5 C), resp. rate 20, height 5\' 11"  (1.803 m), weight 76.4 kg, SpO2 98%.  Medical Problem List and Plan: 1. Functional deficits secondary to traumatic SDH s/p crani/evacuation x2  -pt also with meningioma s/p resection             -patient may  shower -cover incision/crani             -ELOS/Goals: 9-12 days min A-- 5/27 DC date  -Continue CIR PT, OT, and SLP 5-19: Per discharge summary, Decadron  taper was to be stopped; will initiate slow taper over the next 8 days given ongoing blood pressure issues. 5/20: Wife receptive to education. Min-Mod LBD, Mod for ambulation with orthostatics. May downgrade to Min A goals. Per wife ambulation looking pretty close to normal. CGA for transfers and up to 200 ft ambulation with a walker. Mod A cognition.  5/24: Discussed that wife would now like to pursue SNF--confirmed today 5-27: Per family, changing discharge plan to home. The patient is medically ready for discharge to home and will need  follow-up with [facility] PM[facility]. In addition, they will need to follow up with their PCP, Neurosurgery and Neurology.   2.  Antithrombotics: -DVT/anticoagulation:  Pharmaceutical: Lovenox  added on 05/15.             -antiplatelet therapy: N/A 3. Pain Management:  Oxycodone  prn. Pt denies pain 4. Mood/Behavior/Sleep: LCSW to follow for evaluation and support.              -antipsychotic agents: N/A  -pt very reliant on wife for emotional support, decision-making   -  5/20: Sleep log added per request--sleeping well.  5. Neuropsych/cognition: This patient may not be fully capable of making decisions on his own behalf.  5/20: Per wife will have intermittent muteness at baseline; ?underlying dementia vs. Mood/behavioral  6. Skin/Wound Care: Routine pressure relief Mesures.    -  5/22: Per family, Dr. Ellery Guthrie coming in for wound evaluation and suture removal today. Since 12 days post-op, will remove tomorrow if not.   5/23: Suture removal ordered  7. Fluids/Electrolytes/Nutrition: Monitor I/O. Check CMET in a.              --monitor intake--did not want to eat "wife will feed me"  -10/12/23 BMP stabe, Cr 0.68  -5-19: BUN/creatinine stable, hyponatremia improved  5-23: Good p.o. intakes, however I's and O's remain negative daily.  Will start overnight IV fluids as below.  5-26: P.o.  intakes okay.  Will stop IV fluids overnight, encourage oral intakes.  5-27: BMP stable.  Will repeat in a few days at discharge and follow-up if telephone on results  8. Vasovagal syncope/post ictal aphasia?: On keppra  750 mg BID per Dr. Arora --monitor for orthostatic changes. stopped Flomax  per wife request  -5/16- spoke with wife who has concerns that the increased keppra  dose has affected his mentation. I contacted Dr. Bonnita Buttner who felt that we could go back to the 500mg  bid dose for now and observe.   5/18 discussed prior decrease decrease Keppra  dose 500 mg twice daily with wife  5-19:  Wife requesting EEG or reduction  of Keppra ; given no acute concern for seizures, do not feel EEG would be beneficial at this time.  Will discuss with Dr. Arora further dose reduction before initiating.  5-20: Neurology reducing Keppra  to 250 mg twice daily dose per wife's request, after discussion of risks versus benefits.  5/24: continue this dose of keppra   9. Abdominal distension:   had BM yesterday --Hx of sigmoid volvulus 09/2017.   5/16-colonic ileus on KUB yesterday. Don't need another KUB this morning as exam is unchanged--recheck KUB tomorrow -CMET pending -clears only -IVF, IV reglan  -SSE today -(may have to wait for wife to be present to begin these interventions) -5/17 + medium BM today, reports appetite is a little improving, continue current diet, Reglan  and reassess tomorrow -soap suds enema today 5/18 soft diet started, patient's wife feels like Reglan  is over sedating him.  He is tolerating soft diet today.    Discussed with GI Reglan  would not likely provide much benefit for colonic ileus, will discontinue this.   Clinically doing much better. -GI recommended considering Linzess to 290 mcg daily, consider enema tomorrow if not improving.  Wife would like him not to have Linzess, reports his colon is chronically distended due to prior surgery.  She said he always has bowel movements in the evening after drinking water.  Will hold off on Linzess today consider if not improving. 5-19: Producing small soft, soft bowel movements approximately twice daily.  Tolerating p.o. diet.  Continue current regimen, encourage p.o. fluids.  Per wife, despite ongoing colonic distention and large stool burden on x-ray, wishes to not escalate bowel regimen further and requests no further imaging, no GI input/consultation. 5-20: Large bowel movement this afternoon per wife; last bowel movement that afternoon, medium. Last bowel movement 5-26  10. Urinary retention/? CAUTI: Hx of Prostate CA s/p prostatectomy/XRT --Will d/c flomax   per multiple wife's request.  --Continue finasteride  which was added on 05/13 -5/14-can continue foley until ileus is resolved 5/18 wife indicates that she would like to continue Foley catheter for now and follow-up with urology outpatient--discussed considerable risks of this regarding infection, she wishes to continue 5-23: Patient complaining of symptoms of urinary urgency/spasms.  Foley changed out, initial urinalysis shows large leukocyte esterase and rare bacteria but no nitrites.  Given active symptoms, will start Bactrim 800 mg twice daily for 7 days.  Trend cultures.   - Will hold off on starting Urispas at this time due to history of gastric ileus.  5/26: Cultures positive for Pseudomonas over the weekend--only 40k so will stop Bactrim. No further urinary spasms.   11. Hyponatremia/SIADH: Monitor for now.  -5/17 stable at 131  Stable to slightly improved 132 5-19  5-22: Hyponatremia down to 130 today.  Will get serum osmolality, urine sodium, urine osmolality to assess.--Indicate mild SIADH, will initiate fluid restriction and overnight normal saline for blood pressure support and sodium supplementation.  Complicated by weaning of glucocorticoids as above.  5-26: NA stable at 132 with overnight IV fluid consistently.  Will move to salt tabs today, 1 g twice daily, repeat BMP in 2 days  5/27: NA stable 133 after starting salt tabs.  Continue as outpatient, will repeat labs in a few days and call the family for results to help ensure no overcorrection.  12. PAF: Monitor HR TID--continue amiodarone.  --Not on [facility] per patient, chart review and pharmacy review. -5/17-18 HR stable monitor     10/22/2023    2:00 PM 10/22/2023    5:46 AM 10/21/2023    7:38 PM  Vitals with BMI  Systolic 102 103 96  Diastolic 57 64 60  Pulse 61 57 64    13. Acute blood loss anemia:    -5/16 hgb stable at 10.1 today\  Recheck tomorrow--stable, 9.7  5-23: Wife refusing oral iron supplements, patient not  appropriate for IV blood transfusion, hemoglobin stable without signs of active bleeding, not appropriate for IV iron repletion.  Could benefit from dietary education as an outpatient on dark, leafy greens and other high iron foods.  5/26: HgB remains stable 10-9  14. H/o LBP radiating to LLE/Neurogenic claudication: Monitor for symptoms with increase in activity.   15.  Hypotension.  - 5-19: Encourage p.o. fluids, added TED hose.  Will avoid binder due to gastrointestinal issues as above.  Later, received IV fluid 1 L for SBP 70s.;  Responsive.  Monitor tomorrow with therapies, may need addition of midodrine.  5-20: Remains with severe orthostatic hypotension after IV fluids yesterday, although with some improvement--per therapies will recover quickly once he is walking.  Add midodrine 2.5 mg twice daily with meals.  Check a.m. cortisol levels once off of Decadron .  5-21: Continues to refuse a.m. orthostats, did well with therapies this a.m. but refused afternoon sessions due to feelings of fatigue.  Given consistent complaints in the afternoon, will increase midodrine to 3 times daily versus initiate overnight maintenance fluids for blood pressure support; will discuss with wife in a.m.  5/22: Continues to be more fatigued/refusing therapies in the evening.  Blood pressure remains stable but very soft.  Will increase midodrine to 2.5 mg 3 times daily.  If fluid restriction required for SIADH based on labs, may need to do normal saline infusions overnight to support BP--will start 5/22  5/25: Hypotensive, midodrine 2.5mg  daily started  5.26: Stop overnight IVF. --may need in increase midodrine again but got IVF overnight so will follow  5-27: Increase midodrine to 2.5 mg twice daily WC for discharge  LOS: 12 days A FACE TO FACE EVALUATION WAS PERFORMED  Bea Lime 10/22/2023, 2:54 PM

## 2023-10-22 NOTE — Progress Notes (Signed)
 Patient ID: Jesse [name], male   DOB: Apr 20, 1941, 83 y.o.   MRN: [id number]   SW received updates that pt and pt wife would like for pt to discharge to home today vs going to SNF. Pt will d/c after his therapy at 4pm.   SW confirmed with pt wife outpatient- [facility] location. SW provided contact information. SW faxed referral.    Norval Been, MSW, LCSW Office: [phone] Cell: [phone] Fax: [phone]

## 2023-10-23 ENCOUNTER — Other Ambulatory Visit (HOSPITAL_COMMUNITY): Payer: Self-pay

## 2023-10-24 NOTE — Progress Notes (Signed)
 Inpatient Rehabilitation Care Coordinator Discharge Note   Patient Details  Name: Jesse [name] MRN: [id number] Date of Birth: 02/17/1941   Discharge location: D/c to home  Length of Stay: 12 days  Discharge activity level: Supervision  Home/community participation: Limited  Patient response JY:NWGNFA Literacy - How often do you need to have someone help you when you read instructions, pamphlets, or other written material from your doctor or pharmacy?: Sometimes  Patient response OZ:HYQMVH Isolation - How often do you feel lonely or isolated from those around you?: Never  Services provided included: MD, RD, PT, OT, SLP, RN, CM, TR, Pharmacy, Neuropsych, SW  Financial Services:  Financial Services Utilized: Medicare    Choices offered to/list presented to: patient wife  Follow-up services arranged:  Outpatient    Outpatient Servicies: [facility] for PT/OT/SLP      Patient response to transportation need: Is the patient able to respond to transportation needs?: Yes In the past 12 months, has lack of transportation kept you from medical appointments or from getting medications?: No In the past 12 months, has lack of transportation kept you from meetings, work, or from getting things needed for daily living?: No   Patient/Family verbalized understanding of follow-up arrangements:  Yes  Individual responsible for coordination of the follow-up plan: contact pt wife  Confirmed correct DME delivered: [name] 10/24/2023    Comments (or additional information):fam edu completed  Summary of Stay    Date/Time Discharge Planning CSW  10/21/23 1133 Pt is now SNF placement. D/c pending preference and bed offer. SW will confirm there are no barriers to discharge. AAC  10/14/23 1046 Discharge home with wife, multi ste entry (3 ste in garage). 1 local son (travels a lot for work) Has DME w/c, RW, Paediatric nurse, and BSC DBS       Jesse [name] A  [employer]

## 2023-10-28 ENCOUNTER — Ambulatory Visit (INDEPENDENT_AMBULATORY_CARE_PROVIDER_SITE_OTHER): Admitting: Internal Medicine

## 2023-10-28 ENCOUNTER — Encounter: Payer: Self-pay | Admitting: Internal Medicine

## 2023-10-28 VITALS — BP 82/60 | HR 76 | Temp 98.0°F | Ht 71.0 in

## 2023-10-28 DIAGNOSIS — Z978 Presence of other specified devices: Secondary | ICD-10-CM | POA: Insufficient documentation

## 2023-10-28 DIAGNOSIS — E876 Hypokalemia: Secondary | ICD-10-CM | POA: Diagnosis not present

## 2023-10-28 DIAGNOSIS — E871 Hypo-osmolality and hyponatremia: Secondary | ICD-10-CM | POA: Diagnosis not present

## 2023-10-28 DIAGNOSIS — Z9889 Other specified postprocedural states: Secondary | ICD-10-CM | POA: Diagnosis not present

## 2023-10-28 DIAGNOSIS — I951 Orthostatic hypotension: Secondary | ICD-10-CM | POA: Diagnosis not present

## 2023-10-28 DIAGNOSIS — R339 Retention of urine, unspecified: Secondary | ICD-10-CM | POA: Insufficient documentation

## 2023-10-28 DIAGNOSIS — I480 Paroxysmal atrial fibrillation: Secondary | ICD-10-CM

## 2023-10-28 DIAGNOSIS — Z86018 Personal history of other benign neoplasm: Secondary | ICD-10-CM

## 2023-10-28 DIAGNOSIS — E039 Hypothyroidism, unspecified: Secondary | ICD-10-CM

## 2023-10-28 NOTE — Assessment & Plan Note (Signed)
    Latest Ref Rng & Units 10/01/2023    5:49 AM 08/26/2020    2:25 PM 07/15/2019    7:56 AM 06/14/2017   12:55 PM 12/14/2016    9:20 AM 06/05/2016    2:22 PM  THYROID   TSH 0.350 - 4.500 uIU/mL 1.444  1.88  4.01  1.548  1.99  2.71   T4,Free(Direct) 0.61 - 1.12 ng/dL    1.61   0.96   Reviewed labwork seems stable.

## 2023-10-28 NOTE — Patient Instructions (Addendum)
 If blood pressure average is less that 65 need to hold keppra , push liquid IV/Gatorade, and go to emergency room if not improved within 1 hour.  3L max daily dose liquid IV, but he can have as much as he likes unless swelling bad- we will recheck electrolytes in 2-4 weeks.     VISIT SUMMARY:  You visited today to discuss your low blood pressure and medication management following your recent surgery for a meningioma. We reviewed your current medications and their effects on your blood pressure, as well as your overall health status, including your electrolyte levels, urinary retention, and history of atrial fibrillation.  YOUR PLAN:  -POST-SURGICAL MANAGEMENT OF MENINGIOMA: You had surgery to remove a large meningioma, but there were complications, including bleeding that required a second surgery. We are concerned about swelling at the incision site and the possibility of the tumor growing back. We will monitor for any neurological changes and discuss the swelling with your neurosurgeon in two weeks. An MRI in two to three months will help us  decide if further treatment, like radiation, is needed.  -HYPOTENSION DUE TO MEDICATION: Your low blood pressure is likely caused by your medication, Keppra . We have already stopped Flomax , which also lowered your blood pressure. You are taking Midodrine  to help raise your blood pressure. If your blood pressure remains low, we may need to stop Keppra  after consulting with your neurosurgeon and neurologist. Continue taking Midodrine  2.5 mg twice daily and monitor your blood pressure closely. Increase your fluid intake with electrolyte solutions like Liquid IV or Gatorade.  -ELECTROLYTE IMBALANCE: You have low levels of calcium , sodium, potassium, and albumin , likely due to malnutrition and fluid restrictions. You are taking supplements to address these imbalances. Increase your intake of electrolyte solutions and continue taking calcium  and sodium chloride   supplements. We will check your blood levels today and again in two to four weeks. Consider dietary supplements like Ensure to help with malnutrition.  -BOWEL VOLVULUS WITH RESECTION: You have a history of bowel volvulus, which has led to malnutrition and electrolyte imbalances. It is important to maintain fluid intake to prevent constipation and support bowel function. Monitor your bowel movements and adjust your fluid intake as needed.  -URINARY RETENTION WITH CATHETER MANAGEMENT: You are currently using a Foley catheter due to urinary retention and are being treated with Cipro  for a suspected urinary tract infection. You have a voiding trial scheduled with a urologist on June 9 to see if the catheter can be removed. Continue taking Cipro  as prescribed.  -ATRIAL FIBRILLATION: You have experienced atrial fibrillation during periods of stress in the past. Although you have not had any recent episodes, it is important to monitor for symptoms, especially during stressful times.  INSTRUCTIONS:  Please follow up with your neurosurgeon in two weeks to discuss the swelling at the incision site. Plan for an MRI in two to three months to evaluate for tumor regrowth. Continue monitoring your blood pressure and consult with your neurosurgeon and neurologist if it remains low. Attend your urology appointment on June 9 for the voiding trial and potential catheter removal. We will check your blood levels of calcium , sodium, potassium, and albumin  today and again in two to four weeks.

## 2023-10-28 NOTE — Assessment & Plan Note (Signed)
 He has low levels of calcium , sodium, potassium, and albumin , likely due to malnutrition and fluid restrictions. Current supplementation includes calcium  and sodium chloride . Plan to increase dietary intake of electrolytes and monitor levels. Emphasize using electrolyte solutions to maintain balance without restricting fluid intake. Increase intake of electrolyte solutions such as Liquid IV, Gatorade, or Pedialyte. Continue calcium  and sodium chloride  supplementation. Check blood levels of calcium , sodium, potassium, and albumin  today and again in two to four weeks. Encourage dietary supplements like Ensure to address malnutrition.

## 2023-10-28 NOTE — Assessment & Plan Note (Signed)
 Urinary retention is managed with a Foley catheter. Recent issues with catheter placement and a potential urinary tract infection are being treated with Cipro . A voiding trial with a urologist on June 9 will assess for catheter removal. Continue Cipro  as prescribed for the urinary tract infection. Attend the urology appointment on June 9 for the voiding trial and potential catheter removal.

## 2023-10-28 NOTE — Assessment & Plan Note (Signed)
 Hypotension is likely secondary to Keppra , with previous contribution from Flomax , which was discontinued. Keppra  significantly drops his blood pressure, causing weakness and balance issues. Current management includes midodrine  to counteract hypotension. If blood pressure remains low, there is potential to discontinue Keppra , with consultation planned with the neurosurgeon and neurologist. The decision to continue or discontinue Keppra  will depend on blood pressure stability and potential seizure risk. Continue midodrine  2.5 mg twice daily. Monitor blood pressure closely, especially mean arterial pressure, and hold Keppra  if it drops below 65 mmHg. Consult with the neurosurgeon and neurologist regarding Keppra  continuation. Increase fluid intake with electrolyte solutions like Liquid IV or Gatorade to help maintain blood pressure. Reassess blood pressure and medication regimen in two weeks.

## 2023-10-28 NOTE — Assessment & Plan Note (Signed)
 Has follow up urology pending, not sure can be removed- he cannot tolerate Flomax  so high likely to retain urine again

## 2023-10-28 NOTE — Assessment & Plan Note (Signed)
 Atrial fibrillation has previously occurred during periods of stress. No current episodes are reported. Monitoring for recurrence, especially during stress, is advised. Monitor for symptoms of atrial fibrillation, especially during periods of stress.

## 2023-10-28 NOTE — Progress Notes (Signed)
 [facility]  Phone: [phone]  - Medical Office Visit -  Visit Date: 10/28/2023 Patient: Jesse [name]   DOB: 01/05/1941   83 y.o. Male  MRN: [id number] Patient Care Team: Anthon Kins, MD as PCP - General (Internal Medicine) Today's Health Care Provider: Anthon Kins, MD  ===========================================    Chief Complaint / Reason for Visit: new pt (Pt is present to est care with pcp. )   Background: 83 y.o. male who has Prostate cancer (HCC); Paroxysmal atrial fibrillation (HCC); Hypothyroidism; Osteoarthritis of right hip; Volvulus of sigmoid colon s/p flex sig/rectal tube 09/27/2017; Mild dementia (HCC); Chronic constipation; History of skin cancer; Erectile dysfunction; Anxiety; Restless legs; Vitamin B12 deficiency; Wound drainage; Intracranial bleed (HCC); Brain mass; Acute encephalopathy; Hyponatremia; Hypokalemia; Hypocalcemia; Status post resection of meningioma; SDH (subdural hematoma) (HCC); and Orthostatic hypotension on their problem list.  Discussed the use of AI scribe software for clinical note transcription with the patient, who gave verbal consent to proceed.  History of Present Illness  83 year old male with a history of meningioma who presents with concerns about low blood pressure and medication management post-surgery. He is accompanied by his wife, who is a Engineer, civil (consulting).  Now retired she provides almost all of the history although patient is able to respond.  Wife is particularly concerned about his blood pressure has not been tolerating Keppra  that well even with additional salt and midodrine  since recent hospitalization associated with fall  On May 4th, he experienced a significant fall at home, resulting in confusion and slurred speech, prompting his wife to call 911. He was diagnosed with a large meningioma in the left frontal area, measuring 8.6 cm, and underwent surgical resection on May 8th. Post-surgery, he experienced  bleeding in the surgical cavity, necessitating a second procedure to place a catheter for drainage.  Since the surgery, he has been experiencing issues with low blood pressure, which his wife attributes to the use of Flomax  and Keppra . Flomax  was initially prescribed for urinary retention but was discontinued after it was found to significantly lower his blood pressure. Keppra  was started prophylactically post-surgery at 800 mg but was reduced to 250 mg twice daily due to similar concerns. Despite the reduction, his blood pressure remains low, often dropping to 88/66 mmHg, affecting his balance and strength.  He has a history of bowel obstruction and urinary retention, and has been hospitalized for these issues in the past. He is currently on Cipro  for a suspected urinary tract infection, which began on May 30th. He has experienced episodes of atrial fibrillation during periods of stress, as reported by his wife.  His current medications include Keppra  250 mg twice daily, Midodrine  2.5 mg twice daily to manage blood pressure, and Cipro  250 mg twice daily. He is also taking calcium  and sodium supplements due to low levels of these electrolytes, and his wife has started giving him Ensure to address weight loss and malnutrition.  He has mild swelling in his ankles and is using a Foley catheter due to urinary retention, with plans for a voiding trial on June 9th to assess the possibility of removing the catheter. No current headaches but occasional dizziness. No seizures since the surgery.  Medications updated/reviewed: Current Outpatient Medications on File Prior to Visit  Medication Sig   calcium  carbonate (TUMS - DOSED IN MG ELEMENTAL CALCIUM ) 500 MG chewable tablet Chew 4 tablets (800 mg of elemental calcium  total) by mouth daily.   Cyanocobalamin  (VITAMIN B 12 PO) Take 1,000  Units by mouth 3 (three) times a week.    levETIRAcetam  (KEPPRA ) 250 MG tablet Take 1 tablet (250 mg total) by mouth 2 (two)  times daily.   melatonin 5 MG TABS Take 1 tablet (5 mg total) by mouth at bedtime as needed.   midodrine  (PROAMATINE ) 2.5 MG tablet Take 1 tablet (2.5 mg total) by mouth 2 (two) times daily with a meal. With breakfast and lunch   senna-docusate (SENOKOT-S) 8.6-50 MG tablet Take 2 tablets by mouth daily with breakfast.   SYNTHROID  50 MCG tablet TAKE 1 TABLET DAILY BEFORE BREAKFAST   acetaminophen  (TYLENOL ) 325 MG tablet Take 2 tablets (650 mg total) by mouth every 6 (six) hours as needed for mild pain (pain score 1-3). (Patient not taking: Reported on 10/28/2023)   polyethylene glycol powder (GLYCOLAX /MIRALAX ) 17 GM/SCOOP powder Take 17 g by mouth daily. (Patient not taking: Reported on 10/28/2023)   sodium chloride  1 g tablet Take 1 tablet (1 g total) by mouth 2 (two) times daily with a meal.   No current facility-administered medications on file prior to visit.  There are no discontinued medications. Current Meds  Medication Sig   calcium  carbonate (TUMS - DOSED IN MG ELEMENTAL CALCIUM ) 500 MG chewable tablet Chew 4 tablets (800 mg of elemental calcium  total) by mouth daily.   Cyanocobalamin  (VITAMIN B 12 PO) Take 1,000 Units by mouth 3 (three) times a week.    levETIRAcetam  (KEPPRA ) 250 MG tablet Take 1 tablet (250 mg total) by mouth 2 (two) times daily.   melatonin 5 MG TABS Take 1 tablet (5 mg total) by mouth at bedtime as needed.   midodrine  (PROAMATINE ) 2.5 MG tablet Take 1 tablet (2.5 mg total) by mouth 2 (two) times daily with a meal. With breakfast and lunch   senna-docusate (SENOKOT-S) 8.6-50 MG tablet Take 2 tablets by mouth daily with breakfast.   SYNTHROID  50 MCG tablet TAKE 1 TABLET DAILY BEFORE BREAKFAST    Allergies:   Allergies as of 10/28/2023 - Review Complete 10/28/2023  Allergen Reaction Noted   Flomax  [tamsulosin ]  10/14/2023   Hm lidocaine  patch [lidocaine ] Rash 10/28/2023   Past Medical History:  has a past medical history of Arthritis, Atrial fibrillation (HCC),  Complication of anesthesia, Dementia (HCC), Depression, Hard of hearing, constipation, Osteoporosis, Prostate cancer (HCC), Restless legs, Thyroid  disease, and Vitiligo. Past Surgical History:   has a past surgical history that includes Prostate biopsy (07/10/2011); Laminectomy (10/2011); Prostate biopsy (09/22/15); Total hip arthroplasty (Right, 02/29/2012); Transurethral resection of prostate; Colonoscopy; Joint replacement; Back surgery; Total hip arthroplasty (Right, 07/04/2016); Flexible sigmoidoscopy (N/A, 09/27/2017); Craniotomy (Left, 10/03/2023); and Craniotomy (N/A, 10/04/2023). Social History:   reports that he quit smoking about 49 years ago. His smoking use included cigarettes. He has never used smokeless tobacco. He reports current alcohol use. He reports that he does not use drugs. Family History:  family history includes Atrial fibrillation in his mother and sister; Cystic fibrosis in his mother. Depression Screen and Health Maintenance:    08/26/2020    1:35 PM 07/22/2019   11:09 AM 01/12/2019    2:25 PM 02/20/2017    2:27 PM  PHQ 2/9 Scores  PHQ - 2 Score 0 0 2 0  PHQ- 9 Score 0 1 3    Health Maintenance  Topic Date Due   COVID-19 Vaccine (6 - 2024-25 season) 01/27/2023   INFLUENZA VACCINE  12/27/2023   Medicare Annual Wellness (AWV)  02/28/2024   DTaP/Tdap/Td (3 - Td or Tdap) 08/03/2028  Pneumonia Vaccine 71+ Years old  Completed   Zoster Vaccines- Shingrix  Completed   HPV VACCINES  Aged Out   Meningococcal B Vaccine  Aged Out   Immunization History  Administered Date(s) Administered   Fluad Quad(high Dose 65+) 03/03/2021, 02/13/2022   Influenza, High Dose Seasonal PF 02/26/2017, 03/12/2018, 03/18/2019   Influenza-Unspecified 04/10/2016   PFIZER(Purple Top)SARS-COV-2 Vaccination 06/22/2019, 07/13/2019, 02/24/2020   PNEUMOCOCCAL CONJUGATE-20 02/28/2023   Pfizer Covid-19 Vaccine Bivalent Booster 10yrs & up 03/03/2021   Pfizer(Comirnaty)Fall Seasonal Vaccine 12 years and older  02/14/2022   Pneumococcal Conjugate-13 02/25/2014   Pneumococcal Polysaccharide-23 03/16/2015   Pneumococcal-Unspecified 03/16/2015   Respiratory Syncytial Virus Vaccine,Recomb Aduvanted(Arexvy) 02/28/2022   Tdap 12/31/2006, 08/04/2018   Zoster Recombinant(Shingrix) 08/04/2018, 12/18/2018     Objective   Physical ExamBP (!) 82/60   Pulse 76   Temp 98 F (36.7 C) (Temporal)   Ht 5\' 11"  (1.803 m)   SpO2 98%   BMI 23.49 kg/m  Wt Readings from Last 10 Encounters:  10/20/23 168 lb 6.9 oz (76.4 kg)  09/30/23 164 lb 10.9 oz (74.7 kg)  08/26/20 162 lb (73.5 kg)  02/19/20 164 lb (74.4 kg)  01/19/20 164 lb 3.2 oz (74.5 kg)  07/22/19 165 lb 4 oz (75 kg)  03/05/19 172 lb (78 kg)  03/04/19 169 lb (76.7 kg)  02/17/19 169 lb (76.7 kg)  01/12/19 168 lb 3.2 oz (76.3 kg)  Vital signs reviewed.  Nursing notes reviewed. Weight trend reviewed. General Appearance:  Well developed, well nourished, well-groomed, healthy-appearing male with Body mass index is 23.49 kg/m. No acute distress appreciable.   Skin: Clear and well-hydrated. Pulmonary:  Normal work of breathing at rest, no respiratory distress apparent. SpO2: 98 %  Musculoskeletal: He demonstrates smooth and coordinated movements throughout all major joints.All extremities are intact.  Neurological:  Awake, alert, oriented, and engaged.  No obvious focal neurological deficits or cognitive impairments.  Sensorium seems unclouded.  Psychiatric:  Appropriate mood, pleasant and cooperative demeanor, cheerful and engaged during the exam  Reviewed Results & Data Results LABS Calcium : low Sodium: low Albumin : low Glucose: high Red blood cells: low Hemoglobin: low  RADIOLOGY MRI brain: 8.6 cm tumor in left frontal area, post-surgical cavity with hematoma (10/03/2023)    No results found for any visits on 10/28/23.  Admission on 10/10/2023, Discharged on 10/22/2023  Component Date Value   Sodium 10/11/2023 130 (L)    Potassium  10/11/2023 4.0    Chloride 10/11/2023 99    CO2 10/11/2023 24    Glucose, Bld 10/11/2023 112 (H)    BUN 10/11/2023 21    Creatinine, Ser 10/11/2023 0.87    Calcium  10/11/2023 8.3 (L)    Total Protein 10/11/2023 5.2 (L)    Albumin  10/11/2023 2.9 (L)    AST 10/11/2023 22    ALT 10/11/2023 9    Alkaline Phosphatase 10/11/2023 48    Total Bilirubin 10/11/2023 0.5    GFR, Estimated 10/11/2023 >60    Anion gap 10/11/2023 7    WBC 10/11/2023 10.2    RBC 10/11/2023 3.34 (L)    Hemoglobin 10/11/2023 10.1 (L)    HCT 10/11/2023 29.4 (L)    MCV 10/11/2023 88.0    MCH 10/11/2023 30.2    MCHC 10/11/2023 34.4    RDW 10/11/2023 13.6    Platelets 10/11/2023 235    nRBC 10/11/2023 0.0    Neutrophils Relative % 10/11/2023 77    Neutro Abs 10/11/2023 7.8 (H)    Lymphocytes Relative 10/11/2023 10  Lymphs Abs 10/11/2023 1.0    Monocytes Relative 10/11/2023 11    Monocytes Absolute 10/11/2023 1.1 (H)    Eosinophils Relative 10/11/2023 0    Eosinophils Absolute 10/11/2023 0.0    Basophils Relative 10/11/2023 0    Basophils Absolute 10/11/2023 0.0    Immature Granulocytes 10/11/2023 2    Abs Immature Granulocytes 10/11/2023 0.21 (H)    Sodium 10/12/2023 131 (L)    Potassium 10/12/2023 4.3    Chloride 10/12/2023 101    CO2 10/12/2023 21 (L)    Glucose, Bld 10/12/2023 109 (H)    BUN 10/12/2023 15    Creatinine, Ser 10/12/2023 0.68    Calcium  10/12/2023 8.2 (L)    GFR, Estimated 10/12/2023 >60    Anion gap 10/12/2023 9    Magnesium 10/12/2023 1.9    Sodium 10/14/2023 132 (L)    Potassium 10/14/2023 4.1    Chloride 10/14/2023 100    CO2 10/14/2023 25    Glucose, Bld 10/14/2023 87    BUN 10/14/2023 16    Creatinine, Ser 10/14/2023 0.82    Calcium  10/14/2023 8.2 (L)    GFR, Estimated 10/14/2023 >60    Anion gap 10/14/2023 7    WBC 10/14/2023 6.0    RBC 10/14/2023 3.23 (L)    Hemoglobin 10/14/2023 9.7 (L)    HCT 10/14/2023 29.0 (L)    MCV 10/14/2023 89.8    MCH 10/14/2023 30.0     MCHC 10/14/2023 33.4    RDW 10/14/2023 13.8    Platelets 10/14/2023 220    nRBC 10/14/2023 0.0    Sodium 10/17/2023 130 (L)    Potassium 10/17/2023 4.3    Chloride 10/17/2023 101    CO2 10/17/2023 24    Glucose, Bld 10/17/2023 97    BUN 10/17/2023 20    Creatinine, Ser 10/17/2023 0.76    Calcium  10/17/2023 8.4 (L)    GFR, Estimated 10/17/2023 >60    Anion gap 10/17/2023 5    WBC 10/17/2023 8.8    RBC 10/17/2023 3.37 (L)    Hemoglobin 10/17/2023 10.2 (L)    HCT 10/17/2023 30.2 (L)    MCV 10/17/2023 89.6    MCH 10/17/2023 30.3    MCHC 10/17/2023 33.8    RDW 10/17/2023 14.0    Platelets 10/17/2023 231    nRBC 10/17/2023 0.0    Osmolality 10/17/2023 285    Osmolality, Ur 10/17/2023 761    Sodium, Ur 10/17/2023 19    Sodium 10/18/2023 132 (L)    Potassium 10/18/2023 4.3    Chloride 10/18/2023 101    CO2 10/18/2023 24    Glucose, Bld 10/18/2023 98    BUN 10/18/2023 21    Creatinine, Ser 10/18/2023 0.75    Calcium  10/18/2023 8.1 (L)    GFR, Estimated 10/18/2023 >60    Anion gap 10/18/2023 7    Specimen Description 10/18/2023 URINE, CATHETERIZED    Special Requests 10/18/2023                     Value:NONE Performed at [facility], [address], [place], [place] [address]    Culture 10/18/2023 40,000 COLONIES/mL PSEUDOMONAS AERUGINOSA (A)    Report Status 10/18/2023 10/20/2023 FINAL    Organism ID, Bacteria 10/18/2023 PSEUDOMONAS AERUGINOSA (A)    Color, Urine 10/18/2023 YELLOW    APPearance 10/18/2023 HAZY (A)    Specific Gravity, Urine 10/18/2023 1.014    pH 10/18/2023 6.0    Glucose, UA 10/18/2023 NEGATIVE    Hgb urine dipstick 10/18/2023  SMALL (A)    Bilirubin Urine 10/18/2023 NEGATIVE    Ketones, ur 10/18/2023 NEGATIVE    Protein, ur 10/18/2023 30 (A)    Nitrite 10/18/2023 NEGATIVE    Leukocytes,Ua 10/18/2023 LARGE (A)    RBC / HPF 10/18/2023 0-5    WBC, UA 10/18/2023 >50    Bacteria, UA 10/18/2023 RARE (A)    Squamous Epithelial / HPF 10/18/2023 0-5     Mucus 10/18/2023 PRESENT    Sodium 10/19/2023 134 (L)    Potassium 10/19/2023 4.5    Chloride 10/19/2023 102    CO2 10/19/2023 25    Glucose, Bld 10/19/2023 100 (H)    BUN 10/19/2023 18    Creatinine, Ser 10/19/2023 0.87    Calcium  10/19/2023 8.2 (L)    GFR, Estimated 10/19/2023 >60    Anion gap 10/19/2023 7    Sodium 10/20/2023 132 (L)    Potassium 10/20/2023 4.3    Chloride 10/20/2023 105    CO2 10/20/2023 22    Glucose, Bld 10/20/2023 91    BUN 10/20/2023 22    Creatinine, Ser 10/20/2023 1.02    Calcium  10/20/2023 7.8 (L)    GFR, Estimated 10/20/2023 >60    Anion gap 10/20/2023 5    Sodium 10/21/2023 132 (L)    Potassium 10/21/2023 4.5    Chloride 10/21/2023 103    CO2 10/21/2023 25    Glucose, Bld 10/21/2023 96    BUN 10/21/2023 18    Creatinine, Ser 10/21/2023 0.91    Calcium  10/21/2023 8.0 (L)    GFR, Estimated 10/21/2023 >60    Anion gap 10/21/2023 4 (L)    WBC 10/21/2023 4.3    RBC 10/21/2023 3.12 (L)    Hemoglobin 10/21/2023 9.3 (L)    HCT 10/21/2023 27.8 (L)    MCV 10/21/2023 89.1    MCH 10/21/2023 29.8    MCHC 10/21/2023 33.5    RDW 10/21/2023 13.8    Platelets 10/21/2023 199    nRBC 10/21/2023 0.0    Sodium 10/21/2023 133 (L)    Potassium 10/21/2023 4.1    Chloride 10/21/2023 103    CO2 10/21/2023 22    Glucose, Bld 10/21/2023 102 (H)    BUN 10/21/2023 18    Creatinine, Ser 10/21/2023 0.84    Calcium  10/21/2023 8.4 (L)    GFR, Estimated 10/21/2023 >60    Anion gap 10/21/2023 8    Sodium 10/22/2023 133 (L)    Potassium 10/22/2023 4.1    Chloride 10/22/2023 103    CO2 10/22/2023 21 (L)    Glucose, Bld 10/22/2023 123 (H)    BUN 10/22/2023 18    Creatinine, Ser 10/22/2023 0.80    Calcium  10/22/2023 8.3 (L)    GFR, Estimated 10/22/2023 >60    Anion gap 10/22/2023 9   No results displayed because visit has over 200 results.     No image results found.   DG Abd 2 Views Result Date: 10/13/2023 CLINICAL DATA:  Ileus. EXAM: ABDOMEN - 2 VIEW  COMPARISON:  Radiograph yesterday FINDINGS: No change in the marked gaseous colonic distension particularly in the left hemiabdomen. Moderate stool is seen in the ascending colon. No convincing small bowel dilatation on the current exam. No evidence of free air. IMPRESSION: No change in marked gaseous colonic distension, typical of colonic ileus. Electronically Signed   By: Chadwick Colonel M.D.   On: 10/13/2023 12:30   DG Abd 1 View Result Date: 10/12/2023 CLINICAL DATA:  Follow-up ileus. EXAM: ABDOMEN - 1 VIEW COMPARISON:  10/10/2023  FINDINGS: Diffuse colonic dilatation shows no significant change compared to prior study. No dilated small bowel loops seen. These findings are consistent with severe colonic ileus. IMPRESSION: No significant change in severe colonic ileus. Electronically Signed   By: Marlyce Sine M.D.   On: 10/12/2023 10:43   DG Abd 1 View Result Date: 10/10/2023 CLINICAL DATA:  Abdominal distension EXAM: ABDOMEN - 1 VIEW COMPARISON:  09/30/2017 FINDINGS: Two supine frontal views of the abdomen and pelvis are obtained. There is marked diffuse gaseous distention of the colon most compatible with adynamic ileus. No radiographic findings to suggest volvulus. No evidence of bowel obstruction. No masses or abnormal calcifications. No acute bony abnormalities. IMPRESSION: 1. Marked diffuse gaseous distention of the colon, most consistent with adynamic ileus. No radiographic evidence of recurrent sigmoid volvulus. Electronically Signed   By: Bobbye Burrow M.D.   On: 10/10/2023 19:48   MR BRAIN W WO CONTRAST Result Date: 10/09/2023 CLINICAL DATA:  Acute neurologic deficit EXAM: MRI HEAD WITHOUT AND WITH CONTRAST TECHNIQUE: Multiplanar, multiecho pulse sequences of the brain and surrounding structures were obtained without and with intravenous contrast. CONTRAST:  7.5mL GADAVIST  GADOBUTROL  1 MMOL/ML IV SOLN COMPARISON:  09/30/2023 FINDINGS: Brain: Status post resection of anterior left convexity  meningioma. There are blood products throughout the resection cavity. No residual contrast enhancing mass. Left convexity subdural hematoma measures 7 mm in thickness. 2 mm posterior right convexity subdural hematoma. There is moderate edema within the anterior left frontal white matter. Generalized volume loss. There is rightward midline shift of approximately 9 mm, improved. Vascular: Normal flow voids. Skull and upper cervical spine: Normal calvarium and skull base. Visualized upper cervical spine and soft tissues are normal. Sinuses/Orbits:No paranasal sinus fluid levels or advanced mucosal thickening. No mastoid or middle ear effusion. Normal orbits. IMPRESSION: 1. Status post resection of anterior left convexity meningioma. No residual contrast enhancing mass. 2. Left convexity subdural hematoma measures 7 mm in thickness. 2 mm posterior right convexity subdural hematoma. 3. Rightward midline shift of approximately 9 mm, improved. Electronically Signed   By: Juanetta Nordmann M.D.   On: 10/09/2023 22:35   EEG adult Result Date: 10/09/2023 Arleene Lack, MD     10/09/2023  5:42 PM Patient Name: YANIS LARIN MRN: [id number] Epilepsy Attending: Arleene Lack Referring Physician/Provider: Imogene Mana, NP Date: 10/09/2023 Duration: 22.16 mins Patient history: 82yo m with ams. EEG to evaluate for seizure Level of alertness: Awake AEDs during EEG study: LEV Technical aspects: This EEG study was done with scalp electrodes positioned according to the 10-20 International system of electrode placement. Electrical activity was reviewed with band pass filter of 1-70Hz , sensitivity of 7 uV/mm, display speed of 84mm/sec with a 60Hz  notched filter applied as appropriate. EEG data were recorded continuously and digitally stored.  Video monitoring was available and reviewed as appropriate. Description: The posterior dominant rhythm consists of 8 Hz activity of moderate voltage (25-35 uV) seen predominantly in posterior  head regions, asymmetric ( left<right) and reactive to eye opening and eye closing. EEG showed continuous 3 to 6 Hz theta-delta slowing admixed with 12-14hz  beta activity in left hemisphere. Hyperventilation and photic stimulation were not performed.   ABNORMALITY - Continuous slow, left hemisphere IMPRESSION: This study is suggestive of cortical dysfunction arising from left hemisphere likely secondary to underlying structural abnormality/ SDH. No seizures or epileptiform discharges were seen throughout the recording. Priyanka O Yadav   CT ANGIO HEAD NECK W WO CM (CODE STROKE) Result Date: 10/09/2023 CLINICAL DATA:  Neuro deficit, concern for stroke, slurred speech. EXAM: CT ANGIOGRAPHY HEAD AND NECK WITH AND WITHOUT CONTRAST TECHNIQUE: Multidetector CT imaging of the head and neck was performed using the standard protocol during bolus administration of intravenous contrast. Multiplanar CT image reconstructions and MIPs were obtained to evaluate the vascular anatomy. Carotid stenosis measurements (when applicable) are obtained utilizing NASCET criteria, using the distal internal carotid diameter as the denominator. RADIATION DOSE REDUCTION: This exam was performed according to the departmental dose-optimization program which includes automated exposure control, adjustment of the mA and/or kV according to patient size and/or use of iterative reconstruction technique. CONTRAST:  75mL OMNIPAQUE  IOHEXOL  350 MG/ML SOLN COMPARISON:  Same day CT head.  MRA head 09/30/2023. FINDINGS: CTA NECK FINDINGS Aortic arch: Four vessel configuration of the aortic arch. Imaged portion shows no evidence of aneurysm or dissection. Mild atherosclerosis. No significant stenosis of the major arch vessel origins. Pulmonary arteries: As permitted by contrast timing, there are no filling defects in the visualized pulmonary arteries. Subclavian arteries: The subclavian arteries are patent bilaterally. Right carotid system: No evidence of  dissection, stenosis (50% or greater), or occlusion. Bulky calcified atherosclerosis along the proximal cervical ICA without hemodynamically significant stenosis. Left carotid system: No evidence of dissection, stenosis (50% or greater), or occlusion. Mild atherosclerosis at the carotid bifurcation without stenosis. Vertebral arteries: Right vertebral artery is dominant. Atherosclerosis at the left vertebral artery origin. The vessel origin is somewhat obscured due to artifact. There is likely severe stenosis at the origin of the left vertebral artery. The vertebral arteries are patent from the origins to the vertebrobasilar confluence. No evidence of dissection. Skeleton: Postsurgical changes of the calvarium. No acute or aggressive finding. Sequelae of laminectomy from C4-C6. Degenerative changes in the visualized spine. Other neck: The visualized airway is patent. No cervical lymphadenopathy. Upper chest: Visualized lung apices are clear. Review of the MIP images confirms the above findings CTA HEAD FINDINGS ANTERIOR CIRCULATION: The intracranial ICAs are patent bilaterally. Atherosclerosis of the bilateral carotid siphons. Mild stenosis of the left cavernous ICA. 1.5 mm prominence along the right supraclinoid ICA at the origin of the posterior communicating artery. No high-grade stenosis, proximal occlusion, or vascular malformation. MCAs: The right M1 segment is patent. Severe stenosis of an M2 superior vision branch of the right MCA. The left M1 segment is patent. Left MCA branches are patent. ACAs: Patent bilaterally. Accounting for differences in modality, there is slightly decreased mass effect on distal ACA branches per compared to prior MRA. POSTERIOR CIRCULATION: No significant stenosis, proximal occlusion, aneurysm, or vascular malformation. PCAs: Patent bilaterally. The left PCA appears primarily supplied by the posterior communicating artery with contribution from a small P1 segment noted. Pcomm:  Visualized bilaterally, larger on the left. SCAs: The superior cerebellar arteries are patent bilaterally. Basilar artery: Patent AICAs: None visualized on the right. PICAs: Visualized bilaterally, larger on the left. Vertebral arteries: The intracranial vertebral arteries are patent. Venous sinuses: As permitted by contrast timing, patent. Anatomic variants: None Review of the MIP images confirms the above findings IMPRESSION: No large vessel occlusion. 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 Accounting for differences in modality there is slightly decreased mass effect on the distal ACA branches compared to prior MRA. 1.5 mm outpouching along the right supraclinoid ICA at the origin of the posterior communicating artery which may reflect an infundibular origin versus less likely small aneurysm. Atherosclerosis in the neck as above without high-grade stenosis. Focal severe stenosis at the origin  of the non dominant left vertebral artery on the aortic arch. Electronically Signed   By: Denny Flack M.D.   On: 10/09/2023 13:13   CT HEAD CODE STROKE WO CONTRAST Result Date: 10/09/2023 CLINICAL DATA:  Code stroke. Neuro deficit, concern for stroke. Slurred speech. EXAM: CT HEAD WITHOUT CONTRAST TECHNIQUE: Contiguous axial images were obtained from the base of the skull through the vertex without intravenous contrast. RADIATION DOSE REDUCTION: This exam was performed according to the departmental dose-optimization program which includes automated exposure control, adjustment of the mA and/or kV according to patient size and/or use of iterative reconstruction technique. COMPARISON:  CT head 10/07/2023 and earlier. FINDINGS: Brain: Left frontotemporal craniotomy. Postoperative changes from resection of right frontal lobe mass and evacuation of parenchymal hematoma. Scattered foci of residual parenchymal hemorrhage in the left frontal lobe are overall similar to prior. Minimally increased  edema within the left frontal lobe. Redemonstrated mixed attenuation subdural collection over the left frontal lobe measuring up to 11 mm in thickness similar to prior. Additional small focus of subdural hemorrhage over the right parietal lobe measuring to 3 mm. Additional subdural hemorrhage along the posterior falx measuring up to 3 mm in thickness, similar to prior. No new or enlarging foci of intracranial hemorrhage. Slightly decreased pneumocephalus. Mass effect is overall similar to prior with partial effacement of the left lateral ventricle. Trace blood products within the right occipital horn. Approximately 9 mm rightward midline shift not significantly changed from prior. Vascular: No hyperdense vessel or unexpected calcification. Skull: Postsurgical changes without acute or aggressive finding. Sinuses/Orbits: No acute finding. Other: Mastoid air cells are clear. ASPECTS [facility] Stroke Program Early CT Score) - Ganglionic level infarction (caudate, lentiform nuclei, internal capsule, insula, M1-M3 cortex): 7 - Supraganglionic infarction (M4-M6 cortex): 3 Total score (0-10 with 10 being normal): 10 IMPRESSION: 1. Redemonstrated postsurgical changes of left frontotemporal craniotomy for resection of left frontal lobe mass and parenchymal hematoma evacuation. 2. Similar appearance of multi compartment intracranial hemorrhage. No new or enlarging foci of intracranial hemorrhage. 3. Similar mass effect with approximately 9 mm rightward midline shift. 4. ASPECTS is 10 These results were communicated to Dr. Arora at 12:38 pm on 10/09/2023 by text page via the [facility] messaging system. Electronically Signed   By: Denny Flack M.D.   On: 10/09/2023 12:38   CT HEAD WO CONTRAST ( ) Result Date: 10/07/2023 CLINICAL DATA:  Provided history: Check resolution of shift. EXAM: CT HEAD WITHOUT CONTRAST TECHNIQUE: Contiguous axial images were obtained from the base of the skull through the vertex without intravenous  contrast. RADIATION DOSE REDUCTION: This exam was performed according to the departmental dose-optimization program which includes automated exposure control, adjustment of the mA and/or kV according to patient size and/or use of iterative reconstruction technique. COMPARISON:  Prior head CT examinations 10/04/2023 and earlier. Brain MRI 09/30/2023. FINDINGS: Brain: Continued interval evolution of postoperative changes from recent prior resection of a left frontal mass and from subsequent left frontal parenchymal hematoma evacuation. Small patchy foci of parenchymal hemorrhage within the left frontal lobe (at site of prior left frontal mass resection) are non-progressed. Edema at this site has slightly progressed. Pneumocephalus persistent although decreased pneumocephalus scattered along the left greater than right frontal lobes. Residual mixed density subdural hematoma along the left cerebral convexity, measuring up to 11 mm in thickness (for instance as seen on series 5, image 50). Subdural hemorrhage along the right cerebral convexity measuring up to 3 mm in thickness, present on the prior head CT of  10/04/2023 but better appreciated on today's study (for instance as seen on series 5, image 25). Persistent mass effect with partial effacement of the left lateral ventricle. Rightward midline shift now measures 7-8 mm (previously 9 mm). Persistent trace hemorrhage within the occipital horn of the right lateral ventricle. Unchanged size and configuration of the ventricular system. Subdural hemorrhage along the mid and posterior falx, measuring up to 3 mm in thickness, similar to the prior CT. Vascular: No hyperdense vessel.  Atherosclerotic calcifications. Skull: Left-sided cranioplasty. Sinuses/Orbits: No orbital mass or acute orbital finding. No significant paranasal sinus disease. IMPRESSION: 1. Continued interval evolution of postoperative changes from recent left frontal mass resection, and subsequent left  frontal parenchymal hematoma evacuation. 2. Patchy foci of parenchymal hemorrhage within the left frontal lobe, non-progressed. Surrounding parenchymal edema has slightly progressed. 3. Residual mixed density subdural hematoma along the left cerebral convexity, measuring up to 11 mm in thickness. 4. Persistent mass effect with partial effacement of the left lateral ventricle. Decreased rightward midline shift now measuring 7-8 mm (previously 9 mm). 5. Thin subdural hematoma along the right cerebral convexity (measuring up to 3 mm in thickness), non-progressed. 6. Trace hemorrhage within the right lateral ventricle occipital horn, unchanged. 7. Subdural hemorrhage along the mid and posterior falx (measuring up to 3 mm in thickness), also not significantly changed. Electronically Signed   By: Bascom Lily D.O.   On: 10/07/2023 15:01   CT HEAD WO CONTRAST ( ) Result Date: 10/04/2023 CLINICAL DATA:  Follow-up status post craniotomy. EXAM: CT HEAD WITHOUT CONTRAST TECHNIQUE: Contiguous axial images were obtained from the base of the skull through the vertex without intravenous contrast. RADIATION DOSE REDUCTION: This exam was performed according to the departmental dose-optimization program which includes automated exposure control, adjustment of the mA and/or kV according to patient size and/or use of iterative reconstruction technique. COMPARISON:  CT head earlier same day, MRI head 09/30/2023. FINDINGS: Brain: Postsurgical changes of right frontotemporal craniotomy. Interval increase in pneumocephalus over the left frontal lobe and new component of pneumocephalus over the anterior right frontal lobe and right temporal lobe. Mixed attenuation subdural collection now measures up to 10 mm in thickness with decreased component of hyperattenuating acute blood products compared to earlier same day CT. Region of patchy parenchymal hemorrhage is significantly decreased from prior with few scattered areas of hemorrhage  noted the largest of which measures up to 12 mm. Similar edema in the left frontal lobe. There is decreased mass effect compared to prior now with up to 9 mm of rightward midline shift, previously 14 mm. Additional component of subdural hemorrhage posteriorly along the falx measuring up to 3 mm in thickness likely reflecting slight redistribution of blood products. Decreased mass effect on the left lateral ventricle. Slightly decreased caliber of the right lateral ventricle. Decreased mass effect on the third ventricle also noted. Trace blood products within the right occipital horn. Vascular: Atherosclerotic calcifications of the carotid siphons and intracranial vertebral arteries. No hyperdense vessel. Skull: Postsurgical changes without acute or aggressive finding. Sinuses/Orbits: Orbits are symmetric. No significant mucosal thickening in the paranasal sinuses. Other: Mastoid air cells are clear. Interval placement of surgical drain within the left frontoparietal scalp with tip overlying the anterior aspect of craniotomy site. There is slightly increased intramuscular emphysema over the left frontotemporal scalp. IMPRESSION: Interval evacuation of intraparenchymal hemorrhage in the left frontal lobe. Scattered residual foci of parenchymal hemorrhage the largest of which measures up to 12 mm. Decreased mass effect now with 9 mm midline  shift, previously 14 mm. Decreased subdural hematoma over the left cerebral convexity. Component of subdural hemorrhage along the posterior falx is minimally increased measuring up to 3 mm likely reflecting redistribution of blood products. Improved caliber of the ventricles as above. Trace blood products now noted in the right occipital horn. Increased pneumocephalus as above. Electronically Signed   By: Denny Flack M.D.   On: 10/04/2023 16:45   CT HEAD WO CONTRAST ( ) Result Date: 10/04/2023 CLINICAL DATA:  Weakness and altered mental status EXAM: CT HEAD WITHOUT CONTRAST  TECHNIQUE: Contiguous axial images were obtained from the base of the skull through the vertex without intravenous contrast. RADIATION DOSE REDUCTION: This exam was performed according to the departmental dose-optimization program which includes automated exposure control, adjustment of the mA and/or kV according to patient size and/or use of iterative reconstruction technique. COMPARISON:  Head CT and brain MRI from 4 days prior FINDINGS: Brain: Recent resection of left frontal mass. Subdural gas and hemorrhage measuring up to 11 mm in thickness. There is also patchy parenchymal hemorrhage at the level of prior left frontal mass effect, left frontal hemorrhage measuring up to 4 cm in length on coronal reformats. Midline shift measures 14 mm, mildly increased from preoperative. No detected infarct. Unchanged degree of right lateral ventriculomegaly. Vascular: Negative Skull: Unremarkable left-sided craniotomy. Sinuses/Orbits: Negative Other: Case discussed with Dr. Ellery Guthrie. IMPRESSION: Blood products in the left frontal lobe and spreading along the left subdural space, subdural collection measuring up to 11 mm in thickness. Midline shift is now 14 mm, mildly increased from preoperative exam. Electronically Signed   By: Ronnette Coke M.D.   On: 10/04/2023 06:55   MR BRAIN W WO CONTRAST Result Date: 09/30/2023 CLINICAL DATA:  Brain/CNS neoplasm, staging EXAM: MRI HEAD WITHOUT AND WITH CONTRAST TECHNIQUE: Multiplanar, multiecho pulse sequences of the brain and surrounding structures were obtained without and with intravenous contrast. CONTRAST:  8mL GADAVIST  GADOBUTROL  1 MMOL/ML IV SOLN COMPARISON:  MRI head September 04, 2017. FINDINGS: Brain: Large (6.1 x 4.8 x 4.7 cm) enhancing and heterogeneous/hemorrhagic extra-axial dural-based mass along the left frontal convexity. Surrounding edema and mass effect with up to 1.3 cm rightward midline shift. Dural thickening and fluid collections along the left greater than right  cerebral convexities, measuring up to 6 mm on the left and trace on the right. No evidence of acute infarct or hydrocephalus. Vascular: Major arterial flow voids are maintained skull base. Skull and upper cervical spine: Normal marrow signal. Sinuses/Orbits: Clear sinuses.  No acute orbital findings. IMPRESSION: 1. 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 Less likely considerations include hemangiopericytoma or dural metastasis. This may represent interval growth of the lesion seen on 2019 MRI. 2. Resulting 1.3 cm of rightward midline shift. 3. Left larger than right (6 mm in thickness on the left) cerebral convexity subdural collections, most likely subdural hematomas. Electronically Signed   By: Stevenson Elbe M.D.   On: 09/30/2023 19:55   EEG adult Result Date: 09/30/2023 Arleene Lack, MD     09/30/2023  4:49 PM Patient Name: VANDERBILT RANIERI MRN: [id number] Epilepsy Attending: Arleene Lack Referring Physician/Provider: Lena Qualia, MD Date: 09/30/2023 Duration: 25.54 mins Patient history: 83yo M with left frontal extra axial lesion. EEG to evaluate for seizure Level of alertness: Awake AEDs during EEG study: None Technical aspects: This EEG study was done with scalp electrodes positioned according to the 10-20 International system of electrode placement. Lobbyist  activity was reviewed with band pass filter of 1-70Hz , sensitivity of 7 uV/mm, display speed of 9mm/sec with a 60Hz  notched filter applied as appropriate. EEG data were recorded continuously and digitally stored.  Video monitoring was available and reviewed as appropriate. Description: The posterior dominant rhythm consists of 9 Hz activity of moderate voltage (25-35 uV) seen predominantly in posterior head regions, symmetric and reactive to eye opening and eye closing. EEG showed continuous 3 to 6 Hz theta-delta slowing in left fronto-centro-temporal region.  Hyperventilation and photic stimulation were not performed.   ABNORMALITY - Continuous slow, left fronto-centro-temporal region IMPRESSION: This study is suggestive of cortical dysfunction arising from left fronto-centro-temporal region likely secondary to underlying structural abnormality. No seizures or epileptiform discharges were seen throughout the recording. Priyanka Suzanne Erps   MR ANGIO HEAD WO CONTRAST Result Date: 09/30/2023 CLINICAL DATA:  Neuro deficit, concern for stroke. EXAM: MRA HEAD WITHOUT CONTRAST TECHNIQUE: Angiographic images of the Circle of Willis were acquired using MRA technique without intravenous contrast. COMPARISON:  Earlier same day CT head.  MRI head 09/04/2017. FINDINGS: Anterior circulation: The intracranial internal carotid arteries are patent and appear normal in caliber. No evidence of high-grade stenosis. The M1 segment of the right MCA is patent. Right MCA bifurcation is patent. Patent M2 branches of the right MCA. Distal right MCA branches are patent. The left M1 segment is patent. Normal appearance of the left MCA bifurcation. Left M2 branches are patent. Distal left MCA branches are patent. The A1 and A2 segments are patent bilaterally. Distal ACA branches are patent. There is subtle mass effect on A4 and A5 branches bilaterally due to large extra-axial mass over the left frontal lobe. Posterior circulation: The visualized intracranial vertebral arteries are patent. Right vertebral artery is dominant. The basilar artery is patent. Posterior communicating arteries noted bilaterally. The PCAs are patent bilaterally. The superior cerebellar arteries are patent bilaterally. AICA visualized on the right. PICA visualized bilaterally, more pronounced on the left. Anatomic variants: None significant. Other: Redemonstrated large extra-axial mass overlying the left frontal lobe. Left subdural collection better evaluated on same day head CT. IMPRESSION: Patent intracranial arterial  vasculature. Mild mass effect on A4 and A5 branches of the ACA is secondary to large extra-axial mass over the left frontal lobe. No high-grade stenosis. Electronically Signed   By: Denny Flack M.D.   On: 09/30/2023 13:24   CT HEAD WO CONTRAST Addendum Date: 09/30/2023 ADDENDUM REPORT: 09/30/2023 03:26 ADDENDUM: Findings discussed with Dr. Monique Ano via telephone at 3:26 a.m. Electronically Signed   By: Stevenson Elbe M.D.   On: 09/30/2023 03:26   Result Date: 09/30/2023 CLINICAL DATA:  Polytrauma, blunt; Polytrauma, penetrating EXAM: CT HEAD WITHOUT CONTRAST CT MAXILLOFACIAL WITHOUT CONTRAST CT CERVICAL SPINE WITHOUT CONTRAST TECHNIQUE: Multidetector CT imaging of the head, cervical spine, and maxillofacial structures were performed using the standard protocol without intravenous contrast. Multiplanar CT image reconstructions of the cervical spine and maxillofacial structures were also generated. RADIATION DOSE REDUCTION: This exam was performed according to the departmental dose-optimization program which includes automated exposure control, adjustment of the mA and/or kV according to patient size and/or use of iterative reconstruction technique. COMPARISON:  MRI September 04, 2017 FINDINGS: CT HEAD FINDINGS Brain: Large (approximately 6.5 cm) heterogeneous and hemorrhagic extra-axial mass along the left frontal convexity with significant mass effect and approximately 1.5 cm of rightward midline shift. Approximately 7 mm thick predominately predominantly low-density subdural fluid collection along the left cerebral convexity. Small amount of hyperdensity within the collection. No  evidence of acute large vascular territory infarct or hydrocephalus. Vascular: Calcific atherosclerosis. Skull: No acute fracture. Other: No mastoid effusions. CT MAXILLOFACIAL FINDINGS Osseous: No fracture or mandibular dislocation. No destructive process. Orbits: Negative. No traumatic or inflammatory finding. Sinuses: Mostly clear. Soft  tissues: Negative. CT CERVICAL SPINE FINDINGS Alignment: No substantial sagittal subluxation. Skull base and vertebrae: No acute fracture. Soft tissues and spinal canal: No prevertebral fluid or swelling. No visible canal hematoma. Disc levels:  Mild for age multilevel degenerative change. Upper chest: Clear sinuses. IMPRESSION: 1. Large (approximately 6.5 cm) heterogeneous and hemorrhagic extra-axial mass along the left frontal convexity with 1.5 cm of rightward midline shift, likely significant increase in size of the lesion seen on 2019 MRI. Recommend MRI head with contrast and neurosurgery consultation. 2. Approximately 7 mm thick predominately predominantly low-density subdural fluid collection along the left cerebral convexity, likely chronic hematoma. Small amount of hyperdensity within the collection is suggestive of acute/recent hemorrhage. 3. No evidence of acute fracture or traumatic malalignment in the cervical spine. Electronically Signed: By: Stevenson Elbe M.D. On: 09/30/2023 03:15   CT Maxillofacial Wo Contrast Addendum Date: 09/30/2023 ADDENDUM REPORT: 09/30/2023 03:26 ADDENDUM: Findings discussed with Dr. Monique Ano via telephone at 3:26 a.m. Electronically Signed   By: Stevenson Elbe M.D.   On: 09/30/2023 03:26   Result Date: 09/30/2023 CLINICAL DATA:  Polytrauma, blunt; Polytrauma, penetrating EXAM: CT HEAD WITHOUT CONTRAST CT MAXILLOFACIAL WITHOUT CONTRAST CT CERVICAL SPINE WITHOUT CONTRAST TECHNIQUE: Multidetector CT imaging of the head, cervical spine, and maxillofacial structures were performed using the standard protocol without intravenous contrast. Multiplanar CT image reconstructions of the cervical spine and maxillofacial structures were also generated. RADIATION DOSE REDUCTION: This exam was performed according to the departmental dose-optimization program which includes automated exposure control, adjustment of the mA and/or kV according to patient size and/or use of iterative  reconstruction technique. COMPARISON:  MRI September 04, 2017 FINDINGS: CT HEAD FINDINGS Brain: Large (approximately 6.5 cm) heterogeneous and hemorrhagic extra-axial mass along the left frontal convexity with significant mass effect and approximately 1.5 cm of rightward midline shift. Approximately 7 mm thick predominately predominantly low-density subdural fluid collection along the left cerebral convexity. Small amount of hyperdensity within the collection. No evidence of acute large vascular territory infarct or hydrocephalus. Vascular: Calcific atherosclerosis. Skull: No acute fracture. Other: No mastoid effusions. CT MAXILLOFACIAL FINDINGS Osseous: No fracture or mandibular dislocation. No destructive process. Orbits: Negative. No traumatic or inflammatory finding. Sinuses: Mostly clear. Soft tissues: Negative. CT CERVICAL SPINE FINDINGS Alignment: No substantial sagittal subluxation. Skull base and vertebrae: No acute fracture. Soft tissues and spinal canal: No prevertebral fluid or swelling. No visible canal hematoma. Disc levels:  Mild for age multilevel degenerative change. Upper chest: Clear sinuses. IMPRESSION: 1. Large (approximately 6.5 cm) heterogeneous and hemorrhagic extra-axial mass along the left frontal convexity with 1.5 cm of rightward midline shift, likely significant increase in size of the lesion seen on 2019 MRI. Recommend MRI head with contrast and neurosurgery consultation. 2. Approximately 7 mm thick predominately predominantly low-density subdural fluid collection along the left cerebral convexity, likely chronic hematoma. Small amount of hyperdensity within the collection is suggestive of acute/recent hemorrhage. 3. No evidence of acute fracture or traumatic malalignment in the cervical spine. Electronically Signed: By: Stevenson Elbe M.D. On: 09/30/2023 03:15   CT Cervical Spine Wo Contrast Addendum Date: 09/30/2023 ADDENDUM REPORT: 09/30/2023 03:26 ADDENDUM: Findings discussed with  Dr. Monique Ano via telephone at 3:26 a.m. Electronically Signed   By: Davene Ernst.D.  On: 09/30/2023 03:26   Result Date: 09/30/2023 CLINICAL DATA:  Polytrauma, blunt; Polytrauma, penetrating EXAM: CT HEAD WITHOUT CONTRAST CT MAXILLOFACIAL WITHOUT CONTRAST CT CERVICAL SPINE WITHOUT CONTRAST TECHNIQUE: Multidetector CT imaging of the head, cervical spine, and maxillofacial structures were performed using the standard protocol without intravenous contrast. Multiplanar CT image reconstructions of the cervical spine and maxillofacial structures were also generated. RADIATION DOSE REDUCTION: This exam was performed according to the departmental dose-optimization program which includes automated exposure control, adjustment of the mA and/or kV according to patient size and/or use of iterative reconstruction technique. COMPARISON:  MRI September 04, 2017 FINDINGS: CT HEAD FINDINGS Brain: Large (approximately 6.5 cm) heterogeneous and hemorrhagic extra-axial mass along the left frontal convexity with significant mass effect and approximately 1.5 cm of rightward midline shift. Approximately 7 mm thick predominately predominantly low-density subdural fluid collection along the left cerebral convexity. Small amount of hyperdensity within the collection. No evidence of acute large vascular territory infarct or hydrocephalus. Vascular: Calcific atherosclerosis. Skull: No acute fracture. Other: No mastoid effusions. CT MAXILLOFACIAL FINDINGS Osseous: No fracture or mandibular dislocation. No destructive process. Orbits: Negative. No traumatic or inflammatory finding. Sinuses: Mostly clear. Soft tissues: Negative. CT CERVICAL SPINE FINDINGS Alignment: No substantial sagittal subluxation. Skull base and vertebrae: No acute fracture. Soft tissues and spinal canal: No prevertebral fluid or swelling. No visible canal hematoma. Disc levels:  Mild for age multilevel degenerative change. Upper chest: Clear sinuses. IMPRESSION: 1. Large  (approximately 6.5 cm) heterogeneous and hemorrhagic extra-axial mass along the left frontal convexity with 1.5 cm of rightward midline shift, likely significant increase in size of the lesion seen on 2019 MRI. Recommend MRI head with contrast and neurosurgery consultation. 2. Approximately 7 mm thick predominately predominantly low-density subdural fluid collection along the left cerebral convexity, likely chronic hematoma. Small amount of hyperdensity within the collection is suggestive of acute/recent hemorrhage. 3. No evidence of acute fracture or traumatic malalignment in the cervical spine. Electronically Signed: By: Stevenson Elbe M.D. On: 09/30/2023 03:15    DG Abd 1 View Result Date: 10/10/2023 CLINICAL DATA:  Abdominal distension EXAM: ABDOMEN - 1 VIEW COMPARISON:  09/30/2017 FINDINGS: Two supine frontal views of the abdomen and pelvis are obtained. There is marked diffuse gaseous distention of the colon most compatible with adynamic ileus. No radiographic findings to suggest volvulus. No evidence of bowel obstruction. No masses or abnormal calcifications. No acute bony abnormalities. IMPRESSION: 1. Marked diffuse gaseous distention of the colon, most consistent with adynamic ileus. No radiographic evidence of recurrent sigmoid volvulus. Electronically Signed   By: Bobbye Burrow M.D.   On: 10/10/2023 19:48        Assessment & Plan Orthostatic hypotension Hypotension is likely secondary to Keppra , with previous contribution from Flomax , which was discontinued. Keppra  significantly drops his blood pressure, causing weakness and balance issues. Current management includes midodrine  to counteract hypotension. If blood pressure remains low, there is potential to discontinue Keppra , with consultation planned with the neurosurgeon and neurologist. The decision to continue or discontinue Keppra  will depend on blood pressure stability and potential seizure risk. Continue midodrine  2.5 mg twice daily.  Monitor blood pressure closely, especially mean arterial pressure, and hold Keppra  if it drops below 65 mmHg. Consult with the neurosurgeon and neurologist regarding Keppra  continuation. Increase fluid intake with electrolyte solutions like Liquid IV or Gatorade to help maintain blood pressure. Reassess blood pressure and medication regimen in two weeks. Status post resection of meningioma Following resection of an 8.6 cm stage II meningioma  in the left frontal area, he experienced complications including bleeding in the surgical cavity, necessitating a second surgery for drainage. There is current concern for swelling at the incision site, suggesting possible inflammation or other complications. Incomplete resection raises the possibility of regrowth. An MRI in two to three months will assess the need for further treatment, such as radiation. Monitor for neurological changes and report to the neurosurgeon if observed. Discuss incision site swelling with the neurosurgeon during the follow-up in two weeks. Plan for an MRI in two to three months to evaluate tumor regrowth and potential radiation therapy. Hypokalemia He has low levels of calcium , sodium, potassium, and albumin , likely due to malnutrition and fluid restrictions. Current supplementation includes calcium  and sodium chloride . Plan to increase dietary intake of electrolytes and monitor levels. Emphasize using electrolyte solutions to maintain balance without restricting fluid intake. Increase intake of electrolyte solutions such as Liquid IV, Gatorade, or Pedialyte. Continue calcium  and sodium chloride  supplementation. Check blood levels of calcium , sodium, potassium, and albumin  today and again in two to four weeks. Encourage dietary supplements like Ensure to address malnutrition. Hypocalcemia He has low levels of calcium , sodium, potassium, and albumin , likely due to malnutrition and fluid restrictions. Current supplementation includes calcium  and  sodium chloride . Plan to increase dietary intake of electrolytes and monitor levels. Emphasize using electrolyte solutions to maintain balance without restricting fluid intake. Increase intake of electrolyte solutions such as Liquid IV, Gatorade, or Pedialyte. Continue calcium  and sodium chloride  supplementation. Check blood levels of calcium , sodium, potassium, and albumin  today and again in two to four weeks. Encourage dietary supplements like Ensure to address malnutrition. Hyponatremia He has low levels of calcium , sodium, potassium, and albumin , likely due to malnutrition and fluid restrictions. Current supplementation includes calcium  and sodium chloride . Plan to increase dietary intake of electrolytes and monitor levels. Emphasize using electrolyte solutions to maintain balance without restricting fluid intake. Increase intake of electrolyte solutions such as Liquid IV, Gatorade, or Pedialyte. Continue calcium  and sodium chloride  supplementation. Check blood levels of calcium , sodium, potassium, and albumin  today and again in two to four weeks. Encourage dietary supplements like Ensure to address malnutrition. Hypothyroidism, unspecified type    Latest Ref Rng & Units 10/01/2023    5:49 AM 08/26/2020    2:25 PM 07/15/2019    7:56 AM 06/14/2017   12:55 PM 12/14/2016    9:20 AM 06/05/2016    2:22 PM  THYROID   TSH 0.350 - 4.500 uIU/mL 1.444  1.88  4.01  1.548  1.99  2.71   T4,Free(Direct) 0.61 - 1.12 ng/dL    7.82   9.56   Reviewed labwork seems stable. Foley catheter in place Has follow up urology pending, not sure can be removed- he cannot tolerate Flomax  so high likely to retain urine again Urinary retention Urinary retention is managed with a Foley catheter. Recent issues with catheter placement and a potential urinary tract infection are being treated with Cipro . A voiding trial with a urologist on June 9 will assess for catheter removal. Continue Cipro  as prescribed for the urinary tract  infection. Attend the urology appointment on June 9 for the voiding trial and potential catheter removal. Paroxysmal atrial fibrillation (HCC) Atrial fibrillation has previously occurred during periods of stress. No current episodes are reported. Monitoring for recurrence, especially during stress, is advised. Monitor for symptoms of atrial fibrillation, especially during periods of stress.   ED Discharge Orders     None     Diagnoses and all orders for  this visit: Acute encephalopathy Hypokalemia Hypocalcemia Hyponatremia Hypothyroidism, unspecified type   Recommended follow up: No follow-ups on file. Future Appointments  Date Time Provider Department Center  11/04/2023  2:00 PM Bea Lime, DO [facility]  11/11/2023  2:45 PM Nani Baba, OT [facility]  11/11/2023  3:30 PM Halpin, Mason K, PT [facility]  11/19/2023  2:45 PM Schinke, Alethea Andes, CCC-SLP [facility]         Additional notes: This document was synthesized by artificial intelligence (Abridge) using HIPAA-compliant recording of the clinical interaction;   We discussed the use of AI scribe software for clinical note transcription with the patient, who gave verbal consent to proceed.    Additional Info: This encounter employed state-of-the-art, real-time, collaborative documentation. The patient actively reviewed and assisted in updating their electronic medical record on a shared screen, ensuring transparency and facilitating joint problem-solving for the problem list, overview, and plan. This approach promotes accurate, informed care. The treatment plan was discussed and reviewed in detail, including medication safety, potential side effects, and all patient questions. We confirmed understanding and comfort with the plan. Follow-up instructions were established, including contacting the office for any concerns, returning if symptoms worsen, persist, or new symptoms develop, and precautions for potential  emergency department visits.  Initial Appointment Goals:  This initial visit focused on establishing a foundation for the patient's care. We collaboratively reviewed his medical history and hospital visit and current main issues, updating the chart as shown in the encounter. Given the extensive information, we prioritized addressing his most pressing concerns, which he reported were: new pt (Pt is present to est care with pcp. )  While the complexity of the patient's medical picture may necessitate further evaluation in subsequent visits, we were able to develop a preliminary care plan together. To expedite a comprehensive plan at the next visit, we encouraged the patient to gather relevant medical records from previous providers. This collaborative approach will ensure a more complete understanding of the patient's health and inform the development of a personalized care plan. We look forward to continuing the conversation and working together with the patient on achieving his health goals.   Collaborative Documentation:  Today's encounter utilized real-time, dynamic patient engagement.  Patients actively participate by directly reviewing and assisting in updating their medical records through a shared screen. This transparency empowers patients to visually confirm chart updates made by the healthcare provider.  This collaborative approach facilitates problem management as we jointly update the problem list, problem overview, and assessment/plan. Ultimately, this process enhances chart accuracy and completeness, fostering shared decision-making, patient education, and informed consent for tests and treatments.  Collaborative Treatment Planning:  Treatment plans were discussed and reviewed in detail.  Explained medication safety and potential side effects.  Encouraged participation and answered all patient questions, confirming understanding and comfort with the plan. Encouraged patient to contact our office  if they have any questions or concerns. Agreed on patient returning to office if symptoms worsen, persist, or new symptoms develop.  ----------------------------------------------------- Anthon Kins, MD  10/28/2023 4:01 PM  [facility]:  [phone]

## 2023-10-28 NOTE — Assessment & Plan Note (Signed)
 Following resection of an 8.6 cm stage II meningioma in the left frontal area, he experienced complications including bleeding in the surgical cavity, necessitating a second surgery for drainage. There is current concern for swelling at the incision site, suggesting possible inflammation or other complications. Incomplete resection raises the possibility of regrowth. An MRI in two to three months will assess the need for further treatment, such as radiation. Monitor for neurological changes and report to the neurosurgeon if observed. Discuss incision site swelling with the neurosurgeon during the follow-up in two weeks. Plan for an MRI in two to three months to evaluate tumor regrowth and potential radiation therapy.

## 2023-10-29 ENCOUNTER — Ambulatory Visit: Payer: Self-pay | Admitting: Internal Medicine

## 2023-10-29 ENCOUNTER — Telehealth: Payer: Self-pay | Admitting: Internal Medicine

## 2023-10-29 ENCOUNTER — Telehealth: Payer: Self-pay

## 2023-10-29 DIAGNOSIS — R2689 Other abnormalities of gait and mobility: Secondary | ICD-10-CM

## 2023-10-29 DIAGNOSIS — D702 Other drug-induced agranulocytosis: Secondary | ICD-10-CM | POA: Insufficient documentation

## 2023-10-29 LAB — COMPREHENSIVE METABOLIC PANEL WITH GFR
ALT: 7 U/L (ref 0–53)
AST: 14 U/L (ref 0–37)
Albumin: 3.3 g/dL — ABNORMAL LOW (ref 3.5–5.2)
Alkaline Phosphatase: 64 U/L (ref 39–117)
BUN: 19 mg/dL (ref 6–23)
CO2: 26 meq/L (ref 19–32)
Calcium: 8.3 mg/dL — ABNORMAL LOW (ref 8.4–10.5)
Chloride: 103 meq/L (ref 96–112)
Creatinine, Ser: 0.7 mg/dL (ref 0.40–1.50)
GFR: 84.9 mL/min (ref >60.00)
Glucose, Bld: 115 mg/dL — ABNORMAL HIGH (ref 70–99)
Potassium: 4.5 meq/L (ref 3.5–5.1)
Sodium: 133 meq/L — ABNORMAL LOW (ref 135–145)
Total Bilirubin: 0.3 mg/dL (ref 0.2–1.2)
Total Protein: 5.4 g/dL — ABNORMAL LOW (ref 6.0–8.3)

## 2023-10-29 LAB — CBC WITH DIFFERENTIAL/PLATELET
Basophils Absolute: 0 10*3/uL (ref 0.0–0.1)
Basophils Relative: 0 % (ref 0.0–3.0)
Eosinophils Absolute: 0.1 10*3/uL (ref 0.0–0.7)
Eosinophils Relative: 7.9 % — ABNORMAL HIGH (ref 0.0–5.0)
HCT: 29.4 % — ABNORMAL LOW (ref 39.0–52.0)
Hemoglobin: 10 g/dL — ABNORMAL LOW (ref 13.0–17.0)
Lymphocytes Relative: 36.8 % (ref 12.0–46.0)
Lymphs Abs: 0.6 10*3/uL — ABNORMAL LOW (ref 0.7–4.0)
MCHC: 33.8 g/dL (ref 30.0–36.0)
MCV: 87.7 fl (ref 78.0–100.0)
Monocytes Absolute: 0.5 10*3/uL (ref 0.1–1.0)
Monocytes Relative: 30.5 % — ABNORMAL HIGH (ref 3.0–12.0)
Neutro Abs: 0.4 10*3/uL — ABNORMAL LOW (ref 1.4–7.7)
Neutrophils Relative %: 24.8 % — ABNORMAL LOW (ref 43.0–77.0)
Platelets: 236 10*3/uL (ref 150.0–400.0)
RBC: 3.35 Mil/uL — ABNORMAL LOW (ref 4.22–5.81)
RDW: 14.3 % (ref 11.5–15.5)
WBC: 1.7 10*3/uL — CL (ref 4.0–10.5)

## 2023-10-29 LAB — MAGNESIUM: Magnesium: 2 mg/dL (ref 1.5–2.5)

## 2023-10-29 NOTE — Addendum Note (Signed)
 Addended by: Christan Defranco G on: 10/29/2023 04:22 PM   Modules accepted: Orders

## 2023-10-29 NOTE — Telephone Encounter (Signed)
 Copied from CRM [id number]. Topic: Clinical - Order For Equipment >> Oct 29, 2023  3:39 PM Jesse [name] wrote: Reason for CRM: Patients wife calling stating they received an order from Dr. Boston Byers for a manual wheelchair and would like the order changed to a transport chair. [name] states it's more lighter for her.  Jesse [name] [phone]   Please review pts comments and advise

## 2023-10-29 NOTE — Telephone Encounter (Signed)
 Spoke with Camilo Cella from [place] stated that had Critical labs pt WBC was 1.7 Dr Boston Byers notified provider stated to send to him message.

## 2023-10-29 NOTE — Telephone Encounter (Signed)
 Provider stated he would call pt about the critical lab

## 2023-10-29 NOTE — Telephone Encounter (Signed)
 Printed and on my desk for provider to sign tomorrow .

## 2023-10-30 ENCOUNTER — Telehealth: Payer: Self-pay

## 2023-10-30 LAB — URINALYSIS W MICROSCOPIC + REFLEX CULTURE
Bacteria, UA: NONE SEEN /HPF
Bilirubin Urine: NEGATIVE
Glucose, UA: NEGATIVE
Hyaline Cast: NONE SEEN /LPF
Ketones, ur: NEGATIVE
Nitrites, Initial: NEGATIVE
Protein, ur: NEGATIVE
Specific Gravity, Urine: 1.009 (ref 1.001–1.035)
Squamous Epithelial / HPF: NONE SEEN /HPF (ref <=5)
pH: 5.5 (ref 5.0–8.0)

## 2023-10-30 LAB — URINE CULTURE
MICRO NUMBER:: 16528358
Result:: NO GROWTH
SPECIMEN QUALITY:: ADEQUATE

## 2023-10-30 LAB — CULTURE INDICATED

## 2023-10-30 NOTE — Telephone Encounter (Signed)
 Spoke with pt wife via phone stated she would like to know if pt has any fluid restriction before he was on 1,324ml a day? States she gives him 1 ensure plus a week.

## 2023-10-30 NOTE — Telephone Encounter (Signed)
 Placed order in front office for pt to pick up. I spoke with wife she will come by today to pick up order.

## 2023-10-31 ENCOUNTER — Other Ambulatory Visit (INDEPENDENT_AMBULATORY_CARE_PROVIDER_SITE_OTHER)

## 2023-10-31 ENCOUNTER — Ambulatory Visit: Payer: Self-pay | Admitting: Internal Medicine

## 2023-10-31 DIAGNOSIS — D702 Other drug-induced agranulocytosis: Secondary | ICD-10-CM | POA: Diagnosis not present

## 2023-10-31 LAB — CBC WITH DIFFERENTIAL/PLATELET
Basophils Absolute: 0 10*3/uL (ref 0.0–0.1)
Basophils Relative: 0.7 % (ref 0.0–3.0)
Eosinophils Absolute: 0.1 10*3/uL (ref 0.0–0.7)
Eosinophils Relative: 3.4 % (ref 0.0–5.0)
HCT: 31.8 % — ABNORMAL LOW (ref 39.0–52.0)
Hemoglobin: 10.7 g/dL — ABNORMAL LOW (ref 13.0–17.0)
Lymphocytes Relative: 15.4 % (ref 12.0–46.0)
Lymphs Abs: 0.5 10*3/uL — ABNORMAL LOW (ref 0.7–4.0)
MCHC: 33.7 g/dL (ref 30.0–36.0)
MCV: 86.7 fl (ref 78.0–100.0)
Monocytes Absolute: 0.5 10*3/uL (ref 0.1–1.0)
Monocytes Relative: 14.4 % — ABNORMAL HIGH (ref 3.0–12.0)
Neutro Abs: 2.1 10*3/uL (ref 1.4–7.7)
Neutrophils Relative %: 66.1 % (ref 43.0–77.0)
Platelets: 243 10*3/uL (ref 150.0–400.0)
RBC: 3.67 Mil/uL — ABNORMAL LOW (ref 4.22–5.81)
RDW: 13.7 % (ref 11.5–15.5)
WBC: 3.2 10*3/uL — ABNORMAL LOW (ref 4.0–10.5)

## 2023-10-31 NOTE — Progress Notes (Signed)
 The repeat white blood cell count has doubled in just a couple days off Keppra  so I think that pretty much confirms Keppra  was to blame for the low white blood cell count and the immune system is recovering I do not know if the blood pressure has fixed yet though I would like to hear about that and whether we can get rid of the midodrine 

## 2023-10-31 NOTE — Telephone Encounter (Signed)
 Spoke with pt wife about bp control she will keep track of bp and keep Dr Boston Byers updated on bp.

## 2023-10-31 NOTE — Progress Notes (Signed)
 Cultures are still showing no growth making it seem that the urinalysis blood was not from infection but we should repeat urinalysis in the future just to make sure the abnormalities resolve maybe schedule him for a repeat urinalysis in a week or 2

## 2023-11-01 ENCOUNTER — Telehealth: Payer: Self-pay

## 2023-11-01 NOTE — Telephone Encounter (Signed)
 Spoke with pt wife about concerns how to upload bp reading to [place] advise over the phone.  Copied from CRM [id number]. Topic: General - Other >> Nov 01, 2023  9:11 AM Adonis Hoot wrote: Reason for CRM: Patient is requesting a phone call from [facility]. She has a few questions regarding a husbands health as well as information to give.

## 2023-11-04 ENCOUNTER — Encounter: Attending: Physical Medicine and Rehabilitation | Admitting: Physical Medicine and Rehabilitation

## 2023-11-04 ENCOUNTER — Encounter: Payer: Self-pay | Admitting: Physical Medicine and Rehabilitation

## 2023-11-04 VITALS — BP 128/73 | HR 61 | Ht 71.0 in

## 2023-11-04 DIAGNOSIS — R2689 Other abnormalities of gait and mobility: Secondary | ICD-10-CM | POA: Insufficient documentation

## 2023-11-04 DIAGNOSIS — R339 Retention of urine, unspecified: Secondary | ICD-10-CM | POA: Diagnosis present

## 2023-11-04 DIAGNOSIS — E871 Hypo-osmolality and hyponatremia: Secondary | ICD-10-CM | POA: Insufficient documentation

## 2023-11-04 DIAGNOSIS — S065XAA Traumatic subdural hemorrhage with loss of consciousness status unknown, initial encounter: Secondary | ICD-10-CM | POA: Diagnosis not present

## 2023-11-04 NOTE — Progress Notes (Signed)
 Subjective:    Patient ID: Jesse [name], male    DOB: 12/02/1940, 83 y.o.   MRN: [id number]  HPI  Jesse [name] is a 83 y.o. year old male  who  has a past medical history of Acute encephalopathy (09/30/2023), Arthritis, Atrial fibrillation (HCC), Complication of anesthesia, Dementia (HCC), Depression, Hard of hearing, constipation, Osteoporosis, Prostate cancer (HCC), Restless legs, Thyroid  disease, Vitiligo, and Volvulus of sigmoid colon s/p flex sig/rectal tube 09/27/2017 (09/27/2017).   They are presenting to PM&R clinic for follow up related to crani for resection of left frontal brain tumor and for evacuation of SDH on 10/03/2023 with Dr. Ellery Guthrie, with revision and Sdh evac 5/9 and subsequent 5/15-5/27/25 IPR stay.     Interval Hx:  - Therapies: Have been unable to get in until 6/16; walking around the house with a walker and a gait belt, 375 ft x2. He is needing Min A for sit to stand with the walker, which wife is comfortable doing. He is doing his 3 steps in the garage with his walker and CGA to Min A.    - Follow ups:   PCP - switched to Dr. Boston Byers  Dr. Ellery Guthrie - seeing on 11/18/23.   Urology  - saw today; they asked to keep the foley for now because it is helping his sleep 8 hours a night. H eis seeing his regular Urologist July 15th.    - Falls: none; no near misses   - DME: Dr. Boston Byers gave a script for a lightweight WC to help, since the transport chair they have is too heavy.    - Medications:  PCP stopped Keppra  due to leukopenia, and his  Stopped miralax  - ok with colace and sennakot - eating well and having 2x daily Bms.  He takes midodrine  only if his SBP <100. Recently, his BP has been around 130/70; doing better since coming off of keppra .  He is staying on salt tabs, has liberalized fluids but is drinking gatorade; sodium staying around 133.    - Other concerns: He never had any convulsions except when he fell on the 4th, because I found him with a blood lip and  he didn't remember anything...but I nobody ever saw a seizure.   Pain Inventory Average Pain 0 Pain Right Now 0 My pain is No pain  LOCATION OF PAIN  N/A  BOWEL Number of stools per week:14  Oral laxative use Yes Senokot  Type of laxative oral Enema or suppository use No  History of colostomy No  Incontinent Yes   BLADDER Foley In and out cath, frequency N/A Able to self cath Foley Catheter Bladder incontinence N/A Frequent urination N/A Leakage with coughing No  Difficulty starting stream No  Incomplete bladder emptying No    Mobility walk with assistance use a walker how many minutes can you walk? 20 ability to climb steps?  yes do you drive?  no use a wheelchair Married, lives in [facility], One Level, Yes, able to enter bathroom, 3 steps to enter in home.  Function retired  Neuro/Psych bladder control problems weakness trouble walking dizziness  Prior Studies Any changes since last visit?  no  Physicians involved in your care Primary care Scherrie Curt, MD Neurosurgeon Elna Haggis, MD    Family History  Problem Relation Age of Onset   Atrial fibrillation Mother    Cystic fibrosis Mother    Atrial fibrillation Sister    Cancer Neg Hx    Esophageal cancer  Neg Hx    Colon cancer Neg Hx    Rectal cancer Neg Hx    Stomach cancer Neg Hx    Social History   Socioeconomic History   Marital status: Married    Spouse name: Not on file   Number of children: Not on file   Years of education: Not on file   Highest education level: Not on file  Occupational History   Not on file  Tobacco Use   Smoking status: Former    Current packs/day: 0.00    Types: Cigarettes    Quit date: 05/28/1974    Years since quitting: 49.4   Smokeless tobacco: Never   Tobacco comments:    smoked very little   Vaping Use   Vaping status: Never Used  Substance and Sexual Activity   Alcohol use: Yes    Comment: 4-5 standard drinks per week/ stopped drinkin     Drug use: No   Sexual activity: Yes  Other Topics Concern   Not on file  Social History Narrative   Not on file   Social Drivers of Health   Financial Resource Strain: Low Risk  (08/14/2022)   Received from [facility], [facility]   Overall Financial Resource Strain (CARDIA)    Difficulty of Paying Living Expenses: Not hard at all  Food Insecurity: No Food Insecurity (10/03/2023)   Hunger Vital Sign    Worried About Running Out of Food in the Last Year: Never true    Ran Out of Food in the Last Year: Never true  Transportation Needs: No Transportation Needs (10/03/2023)   PRAPARE - Administrator, Civil Service (Medical): No    Lack of Transportation (Non-Medical): No  Physical Activity: Sufficiently Active (03/12/2022)   Received from [facility], [facility]   Exercise Vital Sign    Days of Exercise per Week: 4 days    Minutes of Exercise per Session: 40 min  Stress: No Stress Concern Present (05/17/2022)   Received from [facility], [facility] - Occupational Stress Questionnaire    Feeling of Stress : Not at all  Social Connections: Moderately Isolated (10/03/2023)   Social Connection and Isolation Panel [NHANES]    Frequency of Communication with Friends and Family: More than three times a week    Frequency of Social Gatherings with Friends and Family: Once a week    Attends Religious Services: Never    Database administrator or Organizations: No    Attends Banker Meetings: Never    Marital Status: Married   Past Surgical History:  Procedure Laterality Date   BACK SURGERY     COLONOSCOPY     CRANIOTOMY Left 10/03/2023   Procedure: CRANIOTOMY TUMOR EXCISION LEFT FRONTAL;  Surgeon: Elna Haggis, MD;  Location: [facility] OR;  Service: Neurosurgery;  Laterality: Left;  Left Frontal Craniotomy for Menigioma   CRANIOTOMY N/A 10/04/2023   Procedure: CRANIOTOMY HEMATOMA EVACUATION SUBDURAL;  Surgeon: Elna Haggis, MD;  Location: [facility] OR;  Service: Neurosurgery;  Laterality: N/A;  REPEAT   FLEXIBLE SIGMOIDOSCOPY N/A 09/27/2017   Procedure: FLEXIBLE SIGMOIDOSCOPY;  Surgeon: Kenney Peacemaker, MD;  Location: [facility] ENDOSCOPY;  Service: Endoscopy;  Laterality: N/A;   JOINT REPLACEMENT     LAMINECTOMY  10/2011   cervical and lumbar laminectomy   PROSTATE BIOPSY  07/10/2011   PROSTATE BIOPSY  09/22/15   TOTAL HIP ARTHROPLASTY Right 02/29/2012   TOTAL HIP ARTHROPLASTY Right 07/04/2016  Procedure: RIGHT TOTAL HIP ARTHROPLASTY ANTERIOR APPROACH;  Surgeon: Adonica Hoose, MD;  Location: [facility] OR;  Service: Orthopedics;  Laterality: Right;   TRANSURETHRAL RESECTION OF PROSTATE     Past Medical History:  Diagnosis Date   Acute encephalopathy 09/30/2023   Arthritis    Atrial fibrillation (HCC)    Complication of anesthesia    Atrial fibrillation during surgery    Dementia (HCC)    Depression    Hard of hearing    Hx of constipation    Osteoporosis    DEXA 11/09/10. Completed treatemtn with Fosamax.   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/rectal tube 09/27/2017 09/27/2017   BP 128/73 (BP Location: Left Arm, Patient Position: Sitting, Cuff Size: Normal)   Pulse 61   Ht 5' 11 (1.803 m)   SpO2 97%   BMI 23.49 kg/m   Opioid Risk Score:   Fall Risk Score:  `1  Depression screen PHQ 2/9     11/04/2023    2:10 PM 08/26/2020    1:35 PM 07/22/2019   11:09 AM 01/12/2019    2:25 PM 02/20/2017    2:27 PM 12/14/2016   10:34 AM 05/29/2016    2:14 PM  Depression screen PHQ 2/9  Decreased Interest 3 0 0 1 0 3 0  Down, Depressed, Hopeless 0 0 0 1 0 3 0  PHQ - 2 Score 3 0 0 2 0 6 0  Altered sleeping 0 0 0 0  3   Tired, decreased energy 0 0 0 1  3   Change in appetite 0 0 0 0  2   Feeling bad or failure about yourself  0 0 0 0  2   Trouble concentrating 0 0 0 0  1   Moving slowly or fidgety/restless 1 0 1 0  3   Suicidal  thoughts 0 0 0 0  0   PHQ-9 Score 4 0 1 3  20    Difficult doing work/chores Very difficult  Not difficult at all Not difficult at all        Review of Systems  Genitourinary:        Currently has a foley catheter in-place until further notice per Urology  Musculoskeletal:  Positive for gait problem.       Currently walks with assistance/wheelchair  Neurological:  Positive for dizziness.       Recent Craniotomy       Objective:   Physical Exam   PE: Constitution: Appropriate appearance for age. No apparent distress   Resp: No respiratory distress. No accessory muscle usage. on RA and CTAB Cardio: Well perfused appearance. 1+ peripheral edema. Abdomen: +distended. Nontender. Hypoactive bowel sounds - unchanged from hospitalization.  Psych: Appropriate mood and affect. Slightly flat.  Neuro: AAOx4.   + Mild delay in cognition Able to perform complex problem solving correctly Remembers 3/3 objects at 5 minutes  Neurologic Exam:   DTRs: Reflexes were 2+ in bilateral achilles, patella, biceps, BR and triceps. Babinsky: flexor responses b/l.   Hoffmans: negative b/l Sensory exam: revealed normal sensation in all dermatomal regions in bilateral upper extremities and bilateral lower extremities Motor exam: strength 4/5 proximally and 5/5 distally throughout bilateral upper extremities and bilateral lower extremities Coordination: Fine motor coordination was normal.  No ataxia.  Gait: not observed due to safety concerns  Assessment & Plan:   ADDISON WHIDBEE is a 83 y.o. year old male  who  has a past medical history of Acute encephalopathy (09/30/2023), Arthritis, Atrial fibrillation (HCC), Complication of anesthesia, Dementia (HCC), Depression, Hard of hearing, constipation, Osteoporosis, Prostate cancer (HCC), Restless legs, Thyroid  disease, Vitiligo, and Volvulus of sigmoid colon s/p flex sig/rectal tube 09/27/2017 (09/27/2017).   They are presenting to PM&R clinic for follow  up related to crani for resection of left frontal brain tumor and for evacuation of SDH on 10/03/2023 with Dr. Ellery Guthrie, with revision and Sdh evac 5/9 and subsequent 5/15-5/27/25 IPR stay.   SDH (subdural hematoma) (HCC) Impaired gait and mobility Start therapies and follow up with Neurosurgery, Urology as scheduled  I agree it is reasonable to hold Keppra  given the circumstances of your suspected seizure; I also agree Dr. Ellery Guthrie needs to be informed and a part of this decision.  Patient and his wife are planning on moving soon; given they are doing very well, we can plan for 3 month follow up remotely, then discharge if you are still progressing well  Urinary retention Continuing foley for comfort, following up with Urology in July  Hyponatremia Stable on  salt tabs, liberalized fluids w/ gatorade; continue

## 2023-11-04 NOTE — Patient Instructions (Signed)
 Start therapies and follow up with Neurosurgery, Urology as scheduled  I agree it is reasonable to hold Keppra  given the circumstances of your suspected seizure; I also agree Dr. Ellery Guthrie needs to be informed and a part of this decision.  Patient and his wife are planning on moving soon; given they are doing very well, we can plan for 3 month follow up remotely, then discharge if you are still progressing well

## 2023-11-11 ENCOUNTER — Ambulatory Visit: Attending: Physical Medicine and Rehabilitation | Admitting: Occupational Therapy

## 2023-11-11 ENCOUNTER — Other Ambulatory Visit: Payer: Self-pay

## 2023-11-11 ENCOUNTER — Ambulatory Visit

## 2023-11-11 DIAGNOSIS — R262 Difficulty in walking, not elsewhere classified: Secondary | ICD-10-CM

## 2023-11-11 DIAGNOSIS — M6281 Muscle weakness (generalized): Secondary | ICD-10-CM | POA: Insufficient documentation

## 2023-11-11 DIAGNOSIS — R2681 Unsteadiness on feet: Secondary | ICD-10-CM | POA: Diagnosis present

## 2023-11-11 DIAGNOSIS — R41840 Attention and concentration deficit: Secondary | ICD-10-CM | POA: Diagnosis present

## 2023-11-11 DIAGNOSIS — I69818 Other symptoms and signs involving cognitive functions following other cerebrovascular disease: Secondary | ICD-10-CM | POA: Insufficient documentation

## 2023-11-11 DIAGNOSIS — Z86018 Personal history of other benign neoplasm: Secondary | ICD-10-CM | POA: Insufficient documentation

## 2023-11-11 DIAGNOSIS — R41841 Cognitive communication deficit: Secondary | ICD-10-CM | POA: Insufficient documentation

## 2023-11-11 DIAGNOSIS — I629 Nontraumatic intracranial hemorrhage, unspecified: Secondary | ICD-10-CM | POA: Diagnosis not present

## 2023-11-11 DIAGNOSIS — R293 Abnormal posture: Secondary | ICD-10-CM | POA: Diagnosis present

## 2023-11-11 DIAGNOSIS — R498 Other voice and resonance disorders: Secondary | ICD-10-CM | POA: Diagnosis present

## 2023-11-11 DIAGNOSIS — R471 Dysarthria and anarthria: Secondary | ICD-10-CM | POA: Insufficient documentation

## 2023-11-11 DIAGNOSIS — Z9889 Other specified postprocedural states: Secondary | ICD-10-CM | POA: Insufficient documentation

## 2023-11-11 NOTE — Therapy (Unsigned)
 OUTPATIENT PHYSICAL THERAPY NEURO EVALUATION   Patient Name: Jesse [name] MRN: [id number] DOB:10-12-1940, 83 y.o., male Today's Date: 11/12/2023   PCP: Anthon Kins, MD REFERRING PROVIDER: Zelda Hickman, PA-C  END OF SESSION:  PT End of Session - 11/11/23 1515     Visit Number 1    Number of Visits 13    Date for PT Re-Evaluation 12/24/23    Authorization Type Medicare    Progress Note Due on Visit 10    PT Start Time 1530    PT Stop Time 1615    PT Time Calculation (min) 45 min          Past Medical History:  Diagnosis Date   Acute encephalopathy 09/30/2023   Arthritis    Atrial fibrillation (HCC)    Complication of anesthesia    Atrial fibrillation during surgery    Dementia (HCC)    Depression    Hard of hearing    Hx of constipation    Osteoporosis    DEXA 11/09/10. Completed treatemtn with Fosamax.   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/rectal tube 09/27/2017 09/27/2017   Past Surgical History:  Procedure Laterality Date   BACK SURGERY     COLONOSCOPY     CRANIOTOMY Left 10/03/2023   Procedure: CRANIOTOMY TUMOR EXCISION LEFT FRONTAL;  Surgeon: Elna Haggis, MD;  Location: [facility] OR;  Service: Neurosurgery;  Laterality: Left;  Left Frontal Craniotomy for Menigioma   CRANIOTOMY N/A 10/04/2023   Procedure: CRANIOTOMY HEMATOMA EVACUATION SUBDURAL;  Surgeon: Elna Haggis, MD;  Location: [facility] OR;  Service: Neurosurgery;  Laterality: N/A;  REPEAT   FLEXIBLE SIGMOIDOSCOPY N/A 09/27/2017   Procedure: FLEXIBLE SIGMOIDOSCOPY;  Surgeon: Kenney Peacemaker, MD;  Location: [facility] ENDOSCOPY;  Service: Endoscopy;  Laterality: N/A;   JOINT REPLACEMENT     LAMINECTOMY  10/2011   cervical and lumbar laminectomy   PROSTATE BIOPSY  07/10/2011   PROSTATE BIOPSY  09/22/15   TOTAL HIP ARTHROPLASTY Right 02/29/2012   TOTAL HIP ARTHROPLASTY Right 07/04/2016   Procedure: RIGHT TOTAL HIP ARTHROPLASTY  ANTERIOR APPROACH;  Surgeon: Adonica Hoose, MD;  Location: [facility] OR;  Service: Orthopedics;  Laterality: Right;   TRANSURETHRAL RESECTION OF PROSTATE     Patient Active Problem List   Diagnosis Date Noted   Impaired gait and mobility 11/04/2023   Drug-induced neutropenia (HCC) 10/29/2023   Foley catheter in place 10/28/2023   Urinary retention 10/28/2023   Orthostatic hypotension 10/15/2023   Status post resection of meningioma 10/10/2023   SDH (subdural hematoma) (HCC) 10/10/2023   Intracranial bleed (HCC) 09/30/2023   Brain mass 09/30/2023   Hyponatremia 09/30/2023   Hypokalemia 09/30/2023   Hypocalcemia 09/30/2023   Wound drainage 09/10/2022   Vitamin B12 deficiency 08/26/2020   Restless legs 07/22/2019   History of skin cancer 01/12/2019   Erectile dysfunction 01/12/2019   Anxiety 01/12/2019   Mild dementia (HCC) 09/27/2017   Chronic constipation 09/27/2017   Osteoarthritis of right hip 06/05/2016   Prostate cancer (HCC) 12/07/2015   Paroxysmal atrial fibrillation (HCC) 12/07/2015   Hypothyroidism 12/07/2015    ONSET DATE: 09/30/23  REFERRING DIAG: Z98.890,Z86.018 (ICD-10-CM) - Status post resection of meningioma I62.9 (ICD-10-CM) - Intracranial bleed  THERAPY DIAG:  Muscle weakness (generalized)  Unsteadiness on feet  Abnormal posture  Difficulty in walking, not elsewhere classified  Rationale for Evaluation and Treatment: Rehabilitation  SUBJECTIVE:                                                                                                                                                                                             SUBJECTIVE STATEMENT: Had meningioma resection and subsequent SDH/ICH.  Spouse reports prior to surgery memory deficits and some difficulty in walking began using RW about 2 weeks prior to onset/event.  Spouse reports his BP has been running low recently with c/o dizziness/lightheadedness Pt accompanied by: significant  other  PERTINENT HISTORY: 83 y.o. year old male  who  has a past medical history of Acute encephalopathy (09/30/2023), Arthritis, Atrial fibrillation (HCC), Complication of anesthesia, Dementia (HCC), Depression, Hard of hearing, constipation, Osteoporosis, Prostate cancer (HCC), Restless legs, Thyroid  disease, Vitiligo, and Volvulus of sigmoid colon s/p flex sig/rectal tube 09/27/2017 (09/27/2017)resection of left frontal brain tumor and for evacuation of SDH on 10/03/2023 with Dr. Ellery Guthrie, with revision and SDH evac 5/9 and subsequent 5/15-5/27/25 IPR stay  PAIN:  Are you having pain? No  PRECAUTIONS: Fall and Other: foley catheter  RED FLAGS: None   WEIGHT BEARING RESTRICTIONS: No  FALLS: Has patient fallen in last 6 months? Yes. Number of falls 2  LIVING ENVIRONMENT: Lives with: lives with their spouse Lives in: House/apartment Stairs: two level home Has following equipment at home: Walker - 2 wheeled  PLOF: Independent with household mobility with device and Independent with community mobility with device  PATIENT GOALS:   OBJECTIVE:  Note: Objective measures were completed at Evaluation unless otherwise noted.  Vitals: 98/67 mmHg, 66 bpm (sitting)  103/69 mmHg, 74 bpm  DIAGNOSTIC FINDINGS:   COGNITION: Overall cognitive status: History of cognitive impairments - at baseline   SENSATION: Not tested  COORDINATION: Rapid alternating movement WFL Heel to shin limited by hip ROM but good accuracy  EDEMA:  none  MUSCLE TONE: NT  MUSCLE LENGTH: Hamstrings: Right -10 deg; Left -10 deg   DTRs:  NT  POSTURE: rounded shoulders and forward head  LOWER EXTREMITY ROM:     Active  Right Eval Left Eval  Hip flexion    Hip extension    Hip abduction    Hip adduction    Hip internal rotation    Hip external rotation    Knee flexion    Knee extension -10 -10  Ankle dorsiflexion 10 15  Ankle plantarflexion    Ankle inversion    Ankle eversion     (Blank rows  = not tested)  LOWER EXTREMITY MMT:    RLE 4/5  LLE 5/5  BED MOBILITY:  Independent    TRANSFERS: independent  CURB:  Findings: SBA-CGA w/ RW  STAIRS: SBA-CGA w/bilat HR and step-to GAIT: Findings: Distance walked:  , Level of assistance: Supervision, and Comments: decreased step length, decreased right foot clearance, increased double limb support  FUNCTIONAL TESTS:  5 times sit to stand: 26 Timed up and go (TUG): 24 sec w/ RW 10 meter walk test: 20.84 sec Dynamic Gait Index: 14/24  [facility] PT Assessment - 11/11/23 0001       Standardized Balance Assessment   Standardized Balance Assessment Dynamic Gait Index;Timed Up and Go Test;Five Times Sit to Stand;10 meter walk test    Five times sit to stand comments  26 sec    10 Meter Walk 20.84 sec = 1.5 ft/sec      Dynamic Gait Index   Level Surface Mild Impairment    Change in Gait Speed Mild Impairment    Gait with Horizontal Head Turns Mild Impairment    Gait with Vertical Head Turns Mild Impairment    Gait and Pivot Turn Moderate Impairment    Step Over Obstacle Moderate Impairment    Step Around Obstacles Mild Impairment    Steps Mild Impairment    Total Score 14      Timed Up and Go Test   Normal TUG (seconds) 24                                                                                                                                        TREATMENT DATE:     PATIENT EDUCATION: Education details: assessment details, rationale of PT interventions, discussion of goals w/ spouse/pt Person educated: Patient and Spouse Education method: Explanation Education comprehension: verbalized understanding  HOME EXERCISE PROGRAM: TBD  GOALS: Goals reviewed with patient? Yes  SHORT TERM GOALS: Target date: 12/03/2023    Patient will perform HEP with family/caregiver supervision for improved strength, balance, transfers, and gait  Baseline: Goal status: INITIAL  2.  Demo improved BLE strength and  balance per time 20 sec 5xSTS test Baseline: 26 sec Goal status: INITIAL  3.  Modified independent ambulation level surfaces x 500 ft to improve functional independence Baseline: supervision x 375 ft w/ RW Goal status: INITIAL  4.  Curb negotiation w/ modified independence to improve safety in community Baseline: SBA-CGA w/ RW Goal status: INITIAL    LONG TERM GOALS: Target date: 12/24/2023    Reduce risk for falls per score 19/24 Dynamic Gait Index Baseline: 14/24 Goal status: INITIAL  2.  Reduce risk for falls per time 14 sec TUG test Baseline: 24 sec w/ RW Goal status: INITIAL  3.  Improve gait speed to 2.0 ft/sec to enhance efficiency community ambulation Baseline: 1.5 ft/sec Goal status: INITIAL  4.  Modified independent stair ambulation to enable access to 2nd floor bedroom Baseline: SBA-CGA Goal status: INITIAL    ASSESSMENT:  CLINICAL IMPRESSION: Patient is a 83 y.o. male who was seen today for  physical therapy evaluation and treatment for s/p ICH.  Exhibits generalized weakness affecting RLE> LLE. Reduced functional independence requiring caregiver supervision assistance in functional mobility. High risk for falls per outcome measures and gait deviations requiring use of RW and supervision for ambulation level surfaces and guarding for curb, stairs, and uneven surfaces.  Discussed rationale of PT intervention to improve mobility by intervention, compensatory, and adaptive strategies. Would benefit from ongoing PT services to address deficits and limitations.    OBJECTIVE IMPAIRMENTS: Abnormal gait, decreased activity tolerance, decreased balance, decreased endurance, decreased knowledge of use of DME, decreased mobility, difficulty walking, decreased ROM, decreased strength, decreased safety awareness, dizziness, and postural dysfunction.   ACTIVITY LIMITATIONS: carrying, lifting, bending, standing, stairs, reach over head, and locomotion level  PARTICIPATION  LIMITATIONS: meal prep, cleaning, laundry, interpersonal relationship, shopping, and community activity  PERSONAL FACTORS: Age, Time since onset of injury/illness/exacerbation, and 3+ comorbidities: PMH are also affecting patient's functional outcome.   REHAB POTENTIAL: Good  CLINICAL DECISION MAKING: Evolving/moderate complexity  EVALUATION COMPLEXITY: Moderate  PLAN:  PT FREQUENCY: 1-2x/week  PT DURATION: 6 weeks  PLANNED INTERVENTIONS: 97750- Physical Performance Testing, 97110-Therapeutic exercises, 97530- Therapeutic activity, [id number]- Neuromuscular re-education, 97535- Self Care, 16109- Manual therapy, [id number]- Gait training, [phone]- Canalith repositioning, and [id number]- Aquatic Therapy  PLAN FOR NEXT SESSION: HEP for strength, balance, stair ambulation   7:53 AM, 11/12/23 M. Kelly Ellwood Steidle, PT, DPT Physical Therapist-  Office Number: [phone]

## 2023-11-11 NOTE — Therapy (Signed)
 OUTPATIENT OCCUPATIONAL THERAPY NEURO EVALUATION  Patient Name: Jesse [name] MRN: [id number] DOB:1940/07/06, 83 y.o., male Today's Date: 11/11/2023  PCP: Anthon Kins, MD REFERRING PROVIDER: Zelda Hickman, PA-C  END OF SESSION:  OT End of Session - 11/11/23 1540     Visit Number 1    Number of Visits 13    Date for OT Re-Evaluation 12/27/23    Authorization Type Medicare A&B/ BCBS 2025    OT Start Time 1450    OT Stop Time 1532    OT Time Calculation (min) 42 min          Past Medical History:  Diagnosis Date   Acute encephalopathy 09/30/2023   Arthritis    Atrial fibrillation (HCC)    Complication of anesthesia    Atrial fibrillation during surgery    Dementia (HCC)    Depression    Hard of hearing    Hx of constipation    Osteoporosis    DEXA 11/09/10. Completed treatemtn with Fosamax.   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/rectal tube 09/27/2017 09/27/2017   Past Surgical History:  Procedure Laterality Date   BACK SURGERY     COLONOSCOPY     CRANIOTOMY Left 10/03/2023   Procedure: CRANIOTOMY TUMOR EXCISION LEFT FRONTAL;  Surgeon: Elna Haggis, MD;  Location: [facility] OR;  Service: Neurosurgery;  Laterality: Left;  Left Frontal Craniotomy for Menigioma   CRANIOTOMY N/A 10/04/2023   Procedure: CRANIOTOMY HEMATOMA EVACUATION SUBDURAL;  Surgeon: Elna Haggis, MD;  Location: [facility] OR;  Service: Neurosurgery;  Laterality: N/A;  REPEAT   FLEXIBLE SIGMOIDOSCOPY N/A 09/27/2017   Procedure: FLEXIBLE SIGMOIDOSCOPY;  Surgeon: Kenney Peacemaker, MD;  Location: [facility] ENDOSCOPY;  Service: Endoscopy;  Laterality: N/A;   JOINT REPLACEMENT     LAMINECTOMY  10/2011   cervical and lumbar laminectomy   PROSTATE BIOPSY  07/10/2011   PROSTATE BIOPSY  09/22/15   TOTAL HIP ARTHROPLASTY Right 02/29/2012   TOTAL HIP ARTHROPLASTY Right 07/04/2016   Procedure: RIGHT TOTAL HIP ARTHROPLASTY ANTERIOR APPROACH;   Surgeon: Adonica Hoose, MD;  Location: [facility] OR;  Service: Orthopedics;  Laterality: Right;   TRANSURETHRAL RESECTION OF PROSTATE     Patient Active Problem List   Diagnosis Date Noted   Impaired gait and mobility 11/04/2023   Drug-induced neutropenia (HCC) 10/29/2023   Foley catheter in place 10/28/2023   Urinary retention 10/28/2023   Orthostatic hypotension 10/15/2023   Status post resection of meningioma 10/10/2023   SDH (subdural hematoma) (HCC) 10/10/2023   Intracranial bleed (HCC) 09/30/2023   Brain mass 09/30/2023   Hyponatremia 09/30/2023   Hypokalemia 09/30/2023   Hypocalcemia 09/30/2023   Wound drainage 09/10/2022   Vitamin B12 deficiency 08/26/2020   Restless legs 07/22/2019   History of skin cancer 01/12/2019   Erectile dysfunction 01/12/2019   Anxiety 01/12/2019   Mild dementia (HCC) 09/27/2017   Chronic constipation 09/27/2017   Osteoarthritis of right hip 06/05/2016   Prostate cancer (HCC) 12/07/2015   Paroxysmal atrial fibrillation (HCC) 12/07/2015   Hypothyroidism 12/07/2015    ONSET DATE: referral date 10/16/23  REFERRING DIAG: Z98.890,Z86.018 (ICD-10-CM) - Status post resection of meningioma I62.9 (ICD-10-CM) - Intracranial bleed  THERAPY DIAG:  Muscle weakness (generalized)  Attention and concentration deficit  Other symptoms and signs involving cognitive functions following other cerebrovascular disease  Unsteadiness on feet  Rationale for Evaluation and Treatment: Rehabilitation  SUBJECTIVE:   SUBJECTIVE STATEMENT: t reports that sometimes he will have dizziness, but not very often.  Pt's spouse reports they are walking 2x/day and she will assess BP before and after.  Pt's spouse reports that pt has elected to remain with foley catheter, but has an upcoming appt to have it changed out.  Spouse is hoping that they will discontinue it soon.  Spouse reports that she is helping him with bathing and LB dressing due to foley catheter. Pt accompanied  by: self and significant other  PERTINENT HISTORY: history of A fib, RLS, dementia, Prostate CA, meningioma dx 2016 (asymptomatic and had refused excision) who was admitted to [facility] on 09/30/23 with one week history of progressive weakness with cognitive change and unwitnessed fall. He was found to have large heterogenous and hemorrhagic extra axial mass along left frontal convexity with 1.5 cm rightward midline shift and 7 mm subdural hemorrhage felt to be acute on chronic.He underwent crani for resection of left frontal brain tumor and for evacuation of SDH on 10/03/23. Post op course complicated by mental status changes with decrease in verbal out put and was found to have accumulation of bleed with mass effect. He received 2 units FFP and was taken back to OR on 05/09 for revision craniotomy and evacuation of SDH. Post op decadron  taper and Keppra  for seizure prophylaxis.  PRECAUTIONS: Fall and Other: L crani, Seizure, HOH, hx of dementia, orthostatic  WEIGHT BEARING RESTRICTIONS: No  PAIN:  Are you having pain? No  FALLS: Has patient fallen in last 6 months? Yes. Number of falls 2, 1 fall about a week prior to fall that took him to the hospital  LIVING ENVIRONMENT: Lives with: lives with their spouse Lives in: House/apartment Stairs: Yes: Internal: Two level, 1/2 bath on main level, Able to live on main level with bedroom/bathroom steps; and External: Side entrace has 3 STE Has following equipment at home: Otho Blitz - 2 wheeled, Wheelchair (manual), Shower bench, bed side commode, and transport chair  PLOF: Independent with household mobility with device and Needs assistance with ADLs  PATIENT GOALS: to get stronger  OBJECTIVE:  Note: Objective measures were completed at Evaluation unless otherwise noted.  HAND DOMINANCE: Right  ADLs: Transfers/ambulation related to ADLs: Supervision - CGA with RW UB Dressing: Supervision/setup LB Dressing: Spouse completing due to foley catheter,  wearing pullup disposable underwear Toileting: using foley catheter for urine, spouse assists with clothing management and hygiene for BM - spouse reports they have a bowel regimen/schedule  Bathing: Spouse will assist him with bathing, currently at bed level due to foley catheter.  Prior to meningioma pt would still need assistance with washing LB in shower and washing hair Tub Shower transfers: not completing currently Equipment: Transfer tub bench and bed side commode   MOBILITY STATUS: Needs Assist: Supervision - CGA with RW  POSTURE COMMENTS:  rounded shoulders, forward head, and posterior pelvic tilt   ACTIVITY TOLERANCE: Activity tolerance: diminished  FUNCTIONAL OUTCOME MEASURES: PSFS: 0    UPPER EXTREMITY ROM:    Active ROM Right eval Left eval  Shoulder flexion ~80 ~80  Shoulder abduction    Shoulder adduction    Shoulder extension    Shoulder internal rotation 90% 90%  Shoulder external rotation 85% 85%  Elbow flexion    Elbow extension    Wrist flexion    Wrist extension    Wrist ulnar deviation    Wrist radial deviation    Wrist pronation    Wrist supination    (  Blank rows = not tested)  UPPER EXTREMITY MMT:   RUE:3+/5 and LUE: 3-/5  COORDINATION: Finger Nose Finger test: slowed bilaterally, mild dysmetria Box and Blocks:  Right 34 blocks, Left 39 blocks  SENSATION: WFL   COGNITION: Overall cognitive status: History of cognitive impairments - at baseline Memory impairments, decreased STM, impaired problem solving  VISION: Subjective report: wears bifocals, has some intermittent diplopia at baseline Baseline vision: Bifocals  VISION ASSESSMENT: To be further assessed in functional context   OBSERVATIONS: Pt appearing unsure of his abilities and frequently looking to spouse for feedback and/or input. Spouse providing majority of pt history and focusing on history and growth of meningioma.  Pt's spouse also reports providing a lot of assistance  with bathing and dressing due to foley catheter.                                                                                                                            TREATMENT DATE:  11/11/23 Educated on purpose of OT with focus on resumption of ADL participation and skills.       PATIENT EDUCATION: Education details: Educated on role and purpose of OT as well as potential interventions and goals for therapy based on initial evaluation findings. Person educated: Patient and Spouse Education method: Explanation Education comprehension: needs further education  HOME EXERCISE PROGRAM: TBD   GOALS: Goals reviewed with patient? Yes  SHORT TERM GOALS: Target date: 12/06/23  Pt and spouse will be independent in coordination and strengthening HEP with use of handouts. Baseline: new to OP OT Goal status: INITIAL  2.  Pt will verbalize understanding of adaptive techniques, task modifications, and/or potential DME/AE needs to increase ease, safety, and independence w/ ADLs Baseline: total assist LB dressing and bathing Goal status: INITIAL  3.  Pt will complete LB dressing with min assist with use of AE PRN. Baseline: total assist LB dressing Goal status: INITIAL  4.  Pt will complete LB bathing with min assist with use of AE PRN Baseline: total assist LB bathing Goal status: INITIAL   LONG TERM GOALS: Target date: 12/27/23  Pt will demonstrate improved activity tolerance by completing standing activity for 10 mins to allow return to IADLs (pt would iron and wash breakfast dishes). Baseline: not engaging in any IADLs at this time Goal status: INITIAL  2.  Pt will be able to complete LB dressing with supervision/setup with use of AE PRN. Baseline: total assist LB dressing Goal status: INITIAL  3.  Pt will be able to complete LB bathing with supervision/setup with use of AE PRN. Baseline: total assist LB bathing Goal status: INITIAL  4.  Pt will demonstrate improved UE  functional use for ADLs as evidenced by increasing box/ blocks score by 6 blocks with RUE and 3 blocks with LUE Baseline: R: 34 blocks, L: 39 blocks Goal status: INITIAL  5.  Patient will report at least two-point increase in average PSFS score or at least  three-point increase in a single activity score indicating functionally significant improvement given minimum detectable change. Baseline: 0 Goal status: INITIAL   ASSESSMENT:  CLINICAL IMPRESSION: Patient is a 83 y.o. male who was seen today for occupational therapy evaluation for impairments s/p meningioma resection and intracranial bleed. Pt currently requiring total assist for LB bathing and dressing, spouse bathing pt at bed level and assisting with hygiene for toileting needs and LB dressing due to foley catheter use.  Pt has demonstrated progress during IP Rehab and will benefit from education on DME and/or AE to aid in increased independence with LB bathing and dressing as well as education on energy conservation and activity tolerance to increase ability to return to PLOF as able.  Pt will benefit from skilled occupational therapy services to address strength and coordination, ROM, pain management, balance, GM/FM control, cognition, safety awareness, introduction of compensatory strategies/AE prn, and implementation of an HEP to improve participation and safety during ADLs and IADLs.    PERFORMANCE DEFICITS: in functional skills including ADLs, IADLs, coordination, ROM, strength, pain, Fine motor control, Gross motor control, body mechanics, endurance, decreased knowledge of use of DME, and UE functional use, cognitive skills including attention, memory, problem solving, safety awareness, and sequencing, and psychosocial skills including coping strategies, environmental adaptation, and routines and behaviors.   IMPAIRMENTS: are limiting patient from ADLs and IADLs.   CO-MORBIDITIES: may have co-morbidities  that affects occupational  performance. Patient will benefit from skilled OT to address above impairments and improve overall function.  MODIFICATION OR ASSISTANCE TO COMPLETE EVALUATION: No modification of tasks or assist necessary to complete an evaluation.  OT OCCUPATIONAL PROFILE AND HISTORY: Problem focused assessment: Including review of records relating to presenting problem.  CLINICAL DECISION MAKING: LOW - limited treatment options, no task modification necessary  REHAB POTENTIAL: Good  EVALUATION COMPLEXITY: Low    PLAN:  OT FREQUENCY: 1-2x/week  OT DURATION: 6 weeks  PLANNED INTERVENTIONS: 16109 OT Re-evaluation, 97535 self care/ADL training, 60454 therapeutic exercise, 97530 therapeutic activity, 97112 neuromuscular re-education, functional mobility training, visual/perceptual remediation/compensation, psychosocial skills training, energy conservation, coping strategies training, patient/family education, and DME and/or AE instructions  RECOMMENDED OTHER SERVICES: Speech therapy  CONSULTED AND AGREED WITH PLAN OF CARE: Patient and family member/caregiver  PLAN FOR NEXT SESSION: Complete BIMS and/or SLUMS, initiate coordination HEP, educate on AE/DME for LB bathing and dressing   Addalee Kavanagh, OTR/L 11/11/2023, 3:48 PM   [facility] at [facility] [address] [place], [place] [address] Phone # [phone] Fax # [phone]

## 2023-11-12 ENCOUNTER — Ambulatory Visit (INDEPENDENT_AMBULATORY_CARE_PROVIDER_SITE_OTHER): Admitting: Internal Medicine

## 2023-11-12 ENCOUNTER — Telehealth: Payer: Self-pay

## 2023-11-12 ENCOUNTER — Encounter: Payer: Self-pay | Admitting: Internal Medicine

## 2023-11-12 VITALS — BP 98/56 | HR 70 | Temp 98.0°F | Ht 71.0 in | Wt 155.0 lb

## 2023-11-12 DIAGNOSIS — K5909 Other constipation: Secondary | ICD-10-CM

## 2023-11-12 DIAGNOSIS — Z9049 Acquired absence of other specified parts of digestive tract: Secondary | ICD-10-CM | POA: Insufficient documentation

## 2023-11-12 DIAGNOSIS — Z978 Presence of other specified devices: Secondary | ICD-10-CM | POA: Diagnosis not present

## 2023-11-12 DIAGNOSIS — R339 Retention of urine, unspecified: Secondary | ICD-10-CM

## 2023-11-12 DIAGNOSIS — R2689 Other abnormalities of gait and mobility: Secondary | ICD-10-CM

## 2023-11-12 DIAGNOSIS — Z87898 Personal history of other specified conditions: Secondary | ICD-10-CM | POA: Insufficient documentation

## 2023-11-12 DIAGNOSIS — E871 Hypo-osmolality and hyponatremia: Secondary | ICD-10-CM

## 2023-11-12 DIAGNOSIS — E559 Vitamin D deficiency, unspecified: Secondary | ICD-10-CM | POA: Insufficient documentation

## 2023-11-12 DIAGNOSIS — I951 Orthostatic hypotension: Secondary | ICD-10-CM | POA: Diagnosis not present

## 2023-11-12 DIAGNOSIS — D649 Anemia, unspecified: Secondary | ICD-10-CM

## 2023-11-12 LAB — CBC WITH DIFFERENTIAL/PLATELET
Basophils Absolute: 0 10*3/uL (ref 0.0–0.1)
Basophils Relative: 0.6 % (ref 0.0–3.0)
Eosinophils Absolute: 0 10*3/uL (ref 0.0–0.7)
Eosinophils Relative: 0.9 % (ref 0.0–5.0)
HCT: 33.5 % — ABNORMAL LOW (ref 39.0–52.0)
Hemoglobin: 11 g/dL — ABNORMAL LOW (ref 13.0–17.0)
Lymphocytes Relative: 10 % — ABNORMAL LOW (ref 12.0–46.0)
Lymphs Abs: 0.6 10*3/uL — ABNORMAL LOW (ref 0.7–4.0)
MCHC: 32.9 g/dL (ref 30.0–36.0)
MCV: 84.5 fl (ref 78.0–100.0)
Monocytes Absolute: 0.9 10*3/uL (ref 0.1–1.0)
Monocytes Relative: 15.8 % — ABNORMAL HIGH (ref 3.0–12.0)
Neutro Abs: 4.1 10*3/uL (ref 1.4–7.7)
Neutrophils Relative %: 72.7 % (ref 43.0–77.0)
Platelets: 292 10*3/uL (ref 150.0–400.0)
RBC: 3.96 Mil/uL — ABNORMAL LOW (ref 4.22–5.81)
RDW: 14.7 % (ref 11.5–15.5)
WBC: 5.6 10*3/uL (ref 4.0–10.5)

## 2023-11-12 LAB — COMPREHENSIVE METABOLIC PANEL WITH GFR
ALT: 7 U/L (ref 0–53)
AST: 15 U/L (ref 0–37)
Albumin: 3.6 g/dL (ref 3.5–5.2)
Alkaline Phosphatase: 91 U/L (ref 39–117)
BUN: 17 mg/dL (ref 6–23)
CO2: 28 meq/L (ref 19–32)
Calcium: 8.9 mg/dL (ref 8.4–10.5)
Chloride: 102 meq/L (ref 96–112)
Creatinine, Ser: 0.8 mg/dL (ref 0.40–1.50)
GFR: 81.52 mL/min (ref >60.00)
Glucose, Bld: 112 mg/dL — ABNORMAL HIGH (ref 70–99)
Potassium: 3.9 meq/L (ref 3.5–5.1)
Sodium: 136 meq/L (ref 135–145)
Total Bilirubin: 0.3 mg/dL (ref 0.2–1.2)
Total Protein: 6 g/dL (ref 6.0–8.3)

## 2023-11-12 LAB — VITAMIN D 25 HYDROXY (VIT D DEFICIENCY, FRACTURES): VITD: 39.31 ng/mL (ref 30.00–100.00)

## 2023-11-12 MED ORDER — SENNOSIDES-DOCUSATE SODIUM 8.6-50 MG PO TABS: 2.0000 | ORAL_TABLET | Freq: Every day | ORAL | 0 refills | Status: AC

## 2023-11-12 MED ORDER — MIDODRINE HCL 2.5 MG PO TABS: 2.5000 mg | ORAL_TABLET | Freq: Two times a day (BID) | ORAL | 0 refills | Status: AC

## 2023-11-12 NOTE — Assessment & Plan Note (Signed)
 Urinary retention is managed with a Foley catheter, scheduled for change on June 23. Follow up with urologist Dr. Doy Gene on July 15. A history of ureteral occlusion may require further intervention.

## 2023-11-12 NOTE — Assessment & Plan Note (Signed)
Updated problem overview for this problem to improve longitudinal management  

## 2023-11-12 NOTE — Assessment & Plan Note (Signed)
 Will order lab testing to guide management.

## 2023-11-12 NOTE — Assessment & Plan Note (Signed)
 Chronic constipation is managed with Colace and senna, with a history of bowel obstruction and partial colectomy. The current regimen is effective, and Miralax  has been discontinued as unnecessary. Prescribe Colace and senna for a 90-day supply.

## 2023-11-12 NOTE — Patient Instructions (Signed)
 VISIT SUMMARY:  Today, we reviewed your current medications and monitored your blood pressure. We discussed your history of seizures, low blood pressure, low sodium and calcium  levels, bowel obstruction, urinary issues, and anemia. We also talked about your general health maintenance and upcoming appointments.  YOUR PLAN:  -HYPONATREMIA: Hyponatremia means low sodium levels in the blood, which can be due to your recent brain injury. We will continue to monitor your sodium levels and you should keep drinking Gatorade to help maintain them. We will reassess your sodium chloride  supplements after your blood work results come in.  -HYPOCALCEMIA: Hypocalcemia means low calcium  levels in the blood, which may also be related to your recent brain injury. We will continue to monitor your calcium  levels and you should keep drinking Gatorade to help maintain them. We will reassess your calcium  supplements after your blood work results come in.  -SEIZURE DISORDER: You have a history of seizures, but you have not had any since stopping Keppra . We are considering valproic acid as an alternative medication that may not affect your blood pressure. We will consult with Dr. Evalee Hila for further management.  -HYPOTENSION: Hypotension means low blood pressure. You are currently managing it with midodrine  as needed. Your blood pressure has improved, so continue using Gatorade to help maintain it. We are prescribing midodrine  2.5 mg twice daily as needed and will monitor your blood pressure before and after walking exercises.  -CHRONIC CONSTIPATION: Chronic constipation is being managed with Colace and senna, and you have a history of bowel obstruction and partial colectomy. Your current regimen is effective, so we will continue with Colace and senna for a 90-day supply.  -URINARY RETENTION WITH INDWELLING CATHETER: You have urinary retention and are using a Foley catheter, which is scheduled for a change on June 23. You  have an appointment with Dr. Doy Gene on July 15 to discuss further management options.  -ANEMIA: Anemia means you have a lower than normal number of red blood cells. We need to do further tests to find out the cause and how to treat it. We will order a complete blood count (CBC) for assessment.  -GENERAL HEALTH MAINTENANCE: Continue to stay hydrated with Gatorade to maintain your electrolyte balance and prevent falls related to low blood pressure.  INSTRUCTIONS:  Please proceed to the lab for blood work today. Schedule a follow-up appointment in one month to monitor your progress.  It was a pleasure seeing you today! Your health and satisfaction are our top priorities.  Scherrie Curt, MD  Your Providers PCP: Anthon Kins, MD,  [phone]) Referring Provider: Anthon Kins, MD,  [phone]) Care Team Provider: Tona Francis, MD,  [phone])     NEXT STEPS: [x]  Early Intervention: Schedule sooner appointment, call our on-call services, or go to emergency room if there is any significant Increase in pain or discomfort New or worsening symptoms Sudden or severe changes in your health [x]  Flexible Follow-Up: We recommend a Return in about 1 month (around 12/12/2023). for optimal routine care. This allows for progress monitoring and treatment adjustments. [x]  Preventive Care: Schedule your annual preventive care visit! It's typically covered by insurance and helps identify potential health issues early. [x]  Lab & X-ray Appointments: Incomplete tests scheduled today, or call to schedule. X-rays: [facility] (M-F, 8:30am-noon or 1pm-5pm). [x]  Medical Information Release: Sign a release form at front desk to obtain relevant medical information we don't have.  MAKING THE MOST OF OUR FOCUSED 20 MINUTE APPOINTMENTS: [x]   Clearly state your top concerns at the beginning of the visit to focus our discussion [x]   If you anticipate you will need more time, please  inform the front desk during scheduling - we can book multiple appointments in the same week. [x]   If you have transportation problems- use our convenient video appointments or ask about transportation support. [x]   We can get down to business faster if you use MyChart to update information before the visit and submit non-urgent questions before your visit. Thank you for taking the time to provide details through MyChart.  Let our nurse know and she can import this information into your encounter documents.  Arrival and Wait Times: [x]   Arriving on time ensures that everyone receives prompt attention. [x]   Early morning (8a) and afternoon (1p) appointments tend to have shortest wait times. [x]   Unfortunately, we cannot delay appointments for late arrivals or hold slots during phone calls.  Getting Answers and Following Up [x]   Simple Questions & Concerns: For quick questions or basic follow-up after your visit, reach us  at [phone] or MyChart messaging. [x]   Complex Concerns: If your concern is more complex, scheduling an appointment might be best. Discuss this with the staff to find the most suitable option. [x]   Lab & Imaging Results: We'll contact you directly if results are abnormal or you don't use MyChart. Most normal results will be on MyChart within 2-3 business days, with a review message from Dr. Boston Byers. Haven't heard back in 2 weeks? Need results sooner? Contact us  at [phone]. [x]   Referrals: Our referral coordinator will manage specialist referrals. The specialist's office should contact you within 2 weeks to schedule an appointment. Call us  if you haven't heard from them after 2 weeks.  Staying Connected [x]   MyChart: Activate your MyChart for the fastest way to access results and message us . See the last page of this paperwork for instructions on how to activate.  Bring to Your Next Appointment [x]   Medications: Please bring all your medication bottles to your next  appointment to ensure we have an accurate record of your prescriptions. [x]   Health Diaries: If you're monitoring any health conditions at home, keeping a diary of your readings can be very helpful for discussions at your next appointment.  Billing [x]   X-ray & Lab Orders: These are billed by separate companies. Contact the invoicing company directly for questions or concerns. [x]   Visit Charges: Discuss any billing inquiries with our administrative services team.  Your Satisfaction Matters [x]   Share Your Experience: We strive for your satisfaction! If you have any complaints, or preferably compliments, please let Dr. Boston Byers know directly or contact our Practice Administrators, Olinda Bertrand or [employer], by asking at the front desk.   Reviewing Your Records [x]   Review this early draft of your clinical encounter notes below and the final encounter summary tomorrow on MyChart after its been completed.  All orders placed so far are visible here: Orthostatic hypotension -     Comprehensive metabolic panel with GFR -     CBC with Differential/Platelet -     Urinalysis w microscopic + reflex cultur -     Midodrine  HCl; Take 1 tablet (2.5 mg total) by mouth 2 (two) times daily with a meal. With breakfast and lunch only take if systolic blood pressure under 621  Dispense: 60 tablet; Refill: 0  Foley catheter in place  Impaired gait and mobility  Chronic constipation -     Sennosides-Docusate Sodium ; Take  2 tablets by mouth daily with breakfast.  Dispense: 60 tablet; Refill: 0  Vitamin D deficiency  Hypocalcemia -     Parathyroid hormone, intact (no Ca) -     VITAMIN D 25 Hydroxy (Vit-D Deficiency, Fractures)  Hyponatremia  History of colon resection  History of seizure  Urinary retention  Anemia, unspecified type

## 2023-11-12 NOTE — Telephone Encounter (Signed)
 Spoke with pt wife stated she does not know how to spell the dr you are talking about for the neurology she would like to know the name to make appt I have look I can not find it could you please advise.Also pt wife is requesting bp monitor rx.  Copied from CRM [id number]. Topic: Clinical - Medical Advice >> Nov 12, 2023 12:49 PM Juleen Oakland F wrote: Reason for CRM: Patient spouse Jesse [name] requested a call back from [facility] regarding information on nuerologist that prescribed patients medication. She says it was not Dr. Ellery Guthrie. Please call her at [phone] (M)

## 2023-11-12 NOTE — Assessment & Plan Note (Signed)
 Uses walker, has physical therapy

## 2023-11-12 NOTE — Telephone Encounter (Signed)
 Spoke with pt wife about neurology gave here number and name.

## 2023-11-12 NOTE — Assessment & Plan Note (Signed)
 Hyponatremia is likely secondary to recent brain injury. Sodium levels require monitoring as the brain heals, which should aid in regulation. Continue Gatorade for its electrolyte content to maintain sodium levels. Order a comprehensive metabolic panel to assess sodium levels and reassess sodium chloride  supplementation after blood work results.

## 2023-11-12 NOTE — Progress Notes (Signed)
 ==============================  [facility]: [phone]   -- Medical Office Visit --  Patient: Jesse [name]      Age: 83 y.o.       Sex:  male  Date:   11/12/2023 Today's Healthcare Provider: Anthon Kins, MD  ==============================   Chief Complaint: Follow up Hypotension (98/70   after walking 102/70 has been his bp started therpy yesterday did very well pt is concerned about dizziness spells at times. Pt wife has records of bp 105/56 110/81 130/74 91/67 .//Seen urology on June 9th  going back June 23rd morning visit put in new foley. Neuro dr is June 25th.)   Discussed the use of AI scribe software for clinical note transcription with the patient, who gave verbal consent to proceed.  History of Present Illness Jesse [name] is an 83 year old male who presents for follow-up on medication management and blood pressure monitoring.  He has a history of seizures and was previously on Keppra , which was discontinued due to low blood pressure. Since stopping Keppra , he has not experienced any seizure activity and is currently not on any seizure medication.  He has been experiencing low blood pressure, which is being managed with midodrine . He takes midodrine  2.5 mg as needed when his systolic blood pressure falls below 100 mmHg. He often goes a whole day without needing it, indicating some improvement in his blood pressure control.  He has a history of low sodium and calcium  levels, which are being monitored. He is currently taking sodium chloride  and calcium  supplements, with adjustments to be made based on upcoming lab results. He is also consuming Gatorade to help manage his low blood pressure and electrolyte levels.  He has a history of bowel obstruction and underwent surgery two years ago, resulting in the removal of half a meter of his descending colon. He manages his bowel movements with Colace and senna, taking two tablets daily, and  reports no current issues with constipation.  He has a Foley catheter in place due to urinary issues and is scheduled for a catheter change. He has an upcoming appointment with a urologist to discuss further management options.  Photographs Taken 11/12/2023 :       Updated Problem List Entries: Problem  History of Seizure  History of Colon Resection  Vitamin D Deficiency    Background Reviewed: Problem List: has Prostate cancer (HCC); Paroxysmal atrial fibrillation (HCC); Hypothyroidism; Osteoarthritis of right hip; Mild dementia (HCC); Chronic constipation; History of skin cancer; Erectile dysfunction; Anxiety; Restless legs; Vitamin B12 deficiency; Wound drainage; Intracranial bleed (HCC); Brain mass; Hyponatremia; Hypokalemia; Hypocalcemia; Status post resection of meningioma; SDH (subdural hematoma) (HCC); Orthostatic hypotension; Foley catheter in place; Urinary retention; Drug-induced neutropenia (HCC); Impaired gait and mobility; History of seizure; History of colon resection; and Vitamin D deficiency on their problem list. Past Medical History:  has a past medical history of Acute encephalopathy (09/30/2023), Arthritis, Atrial fibrillation (HCC), Complication of anesthesia, Dementia (HCC), Depression, Hard of hearing, constipation, Osteoporosis, Prostate cancer (HCC), Restless legs, Thyroid  disease, Vitiligo, and Volvulus of sigmoid colon s/p flex sig/rectal tube 09/27/2017 (09/27/2017). Past Surgical History:   has a past surgical history that includes Prostate biopsy (07/10/2011); Laminectomy (10/2011); Prostate biopsy (09/22/15); Total hip arthroplasty (Right, 02/29/2012); Transurethral resection of prostate; Colonoscopy; Joint replacement; Back surgery; Total hip arthroplasty (Right, 07/04/2016); Flexible sigmoidoscopy (N/A, 09/27/2017); Craniotomy (Left, 10/03/2023); and Craniotomy (N/A, 10/04/2023). Social History:   reports that he quit smoking about 49 years  ago. His smoking use included  cigarettes. He has never used smokeless tobacco. He reports current alcohol use. He reports that he does not use drugs. Family History:  family history includes Atrial fibrillation in his mother and sister; Cystic fibrosis in his mother. Allergies:  is allergic to flomax  [tamsulosin ] and hm lidocaine  patch [lidocaine ].   Medication Reconciliation: Current Outpatient Medications on File Prior to Visit  Medication Sig   calcium  carbonate (TUMS - DOSED IN MG ELEMENTAL CALCIUM ) 500 MG chewable tablet Chew 4 tablets (800 mg of elemental calcium  total) by mouth daily.   Cyanocobalamin  (VITAMIN B 12 PO) Take 1,000 Units by mouth 3 (three) times a week.    sodium chloride  1 g tablet Take 1 tablet (1 g total) by mouth 2 (two) times daily with a meal.   SYNTHROID  50 MCG tablet TAKE 1 TABLET DAILY BEFORE BREAKFAST   No current facility-administered medications on file prior to visit.   Medications Discontinued During This Encounter  Medication Reason   acetaminophen  (TYLENOL ) 325 MG tablet    levETIRAcetam  (KEPPRA ) 250 MG tablet    melatonin 5 MG TABS    polyethylene glycol powder (GLYCOLAX /MIRALAX ) 17 GM/SCOOP powder    midodrine  (PROAMATINE ) 2.5 MG tablet    senna-docusate (SENOKOT-S) 8.6-50 MG tablet Reorder     Physical Exam:    11/12/2023   10:07 AM 11/04/2023    2:13 PM 10/28/2023    3:31 PM  Vitals with BMI  Height 5' 11 5' 11 5' 11  Weight 155 lbs -- --  BMI 21.63    Systolic 98 128 82  Diastolic 56 73 60  Pulse 70 61 76  Vital signs reviewed.  Nursing notes reviewed. Weight trend reviewed. Physical Exam General Appearance:  No acute distress appreciable.   Well-groomed, healthy-appearing male.  Well proportioned with no abnormal fat distribution.  Good muscle tone. Pulmonary:  Normal work of breathing at rest, no respiratory distress apparent. SpO2: 98 %  Musculoskeletal: All extremities are intact.  Neurological:  Awake, alert, oriented, and engaged.  No obvious focal  neurological deficits but he is quite, wife does most of talking.  He does recall some aspects of his career.  Speech is clear and coherent with logical content. Psychiatric:  Appropriate mood, pleasant and cooperative demeanor, thoughtful and engaged during the exam   Results:    11/04/2023    2:10 PM 08/26/2020    1:35 PM 07/22/2019   11:09 AM 01/12/2019    2:25 PM  PHQ 2/9 Scores  PHQ - 2 Score 3 0 0 2  PHQ- 9 Score 4 0 1 3   Results LABS CBC: anemia  RADIOLOGY Head CT: 2 mm hemorrhage at the base of the brain    Results for orders placed or performed in visit on 11/12/23  Comprehensive metabolic panel with GFR  Result Value Ref Range   Sodium 136 135 - 145 mEq/L   Potassium 3.9 3.5 - 5.1 mEq/L   Chloride 102 96 - 112 mEq/L   CO2 28 19 - 32 mEq/L   Glucose, Bld 112 (H) 70 - 99 mg/dL   BUN 17 6 - 23 mg/dL   Creatinine, Ser 1.61 0.40 - 1.50 mg/dL   Total Bilirubin 0.3 0.2 - 1.2 mg/dL   Alkaline Phosphatase 91 39 - 117 U/L   AST 15 0 - 37 U/L   ALT 7 0 - 53 U/L   Total Protein 6.0 6.0 - 8.3 g/dL   Albumin  3.6 3.5 - 5.2  g/dL   GFR 16.10 >96.04 mL/min   Calcium  8.9 8.4 - 10.5 mg/dL  CBC with Differential/Platelet  Result Value Ref Range   WBC 5.6 4.0 - 10.5 K/uL   RBC 3.96 (L) 4.22 - 5.81 Mil/uL   Hemoglobin 11.0 (L) 13.0 - 17.0 g/dL   HCT 54.0 (L) 98.1 - 19.1 %   MCV 84.5 78.0 - 100.0 fl   MCHC 32.9 30.0 - 36.0 g/dL   RDW 47.8 29.5 - 62.1 %   Platelets 292.0 150.0 - 400.0 K/uL   Neutrophils Relative % 72.7 43.0 - 77.0 %   Lymphocytes Relative 10.0 (L) 12.0 - 46.0 %   Monocytes Relative 15.8 (H) 3.0 - 12.0 %   Eosinophils Relative 0.9 0.0 - 5.0 %   Basophils Relative 0.6 0.0 - 3.0 %   Neutro Abs 4.1 1.4 - 7.7 K/uL   Lymphs Abs 0.6 (L) 0.7 - 4.0 K/uL   Monocytes Absolute 0.9 0.1 - 1.0 K/uL   Eosinophils Absolute 0.0 0.0 - 0.7 K/uL   Basophils Absolute 0.0 0.0 - 0.1 K/uL  Vitamin D (25 hydroxy)  Result Value Ref Range   VITD 39.31 30.00 - 100.00 ng/mL   Office  Visit on 11/12/2023  Component Date Value Ref Range Status   Sodium 11/12/2023 136  135 - 145 mEq/L Final   Potassium 11/12/2023 3.9  3.5 - 5.1 mEq/L Final   Chloride 11/12/2023 102  96 - 112 mEq/L Final   CO2 11/12/2023 28  19 - 32 mEq/L Final   Glucose, Bld 11/12/2023 112 (H)  70 - 99 mg/dL Final   BUN 30/86/5784 17  6 - 23 mg/dL Final   Creatinine, Ser 11/12/2023 0.80  0.40 - 1.50 mg/dL Final   Total Bilirubin 11/12/2023 0.3  0.2 - 1.2 mg/dL Final   Alkaline Phosphatase 11/12/2023 91  39 - 117 U/L Final   AST 11/12/2023 15  0 - 37 U/L Final   ALT 11/12/2023 7  0 - 53 U/L Final   Total Protein 11/12/2023 6.0  6.0 - 8.3 g/dL Final   Albumin  11/12/2023 3.6  3.5 - 5.2 g/dL Final   GFR 69/62/9528 81.52  >60.00 mL/min Final   Calcium  11/12/2023 8.9  8.4 - 10.5 mg/dL Final   WBC 41/32/4401 5.6  4.0 - 10.5 K/uL Final   RBC 11/12/2023 3.96 (L)  4.22 - 5.81 Mil/uL Final   Hemoglobin 11/12/2023 11.0 (L)  13.0 - 17.0 g/dL Final   HCT 02/72/5366 33.5 (L)  39.0 - 52.0 % Final   MCV 11/12/2023 84.5  78.0 - 100.0 fl Final   MCHC 11/12/2023 32.9  30.0 - 36.0 g/dL Final   RDW 44/07/4740 14.7  11.5 - 15.5 % Final   Platelets 11/12/2023 292.0  150.0 - 400.0 K/uL Final   Neutrophils Relative % 11/12/2023 72.7  43.0 - 77.0 % Final   Lymphocytes Relative 11/12/2023 10.0 (L)  12.0 - 46.0 % Final   Monocytes Relative 11/12/2023 15.8 (H)  3.0 - 12.0 % Final   Eosinophils Relative 11/12/2023 0.9  0.0 - 5.0 % Final   Basophils Relative 11/12/2023 0.6  0.0 - 3.0 % Final   Neutro Abs 11/12/2023 4.1  1.4 - 7.7 K/uL Final   Lymphs Abs 11/12/2023 0.6 (L)  0.7 - 4.0 K/uL Final   Monocytes Absolute 11/12/2023 0.9  0.1 - 1.0 K/uL Final   Eosinophils Absolute 11/12/2023 0.0  0.0 - 0.7 K/uL Final   Basophils Absolute 11/12/2023 0.0  0.0 - 0.1 K/uL  Final   VITD 11/12/2023 39.31  30.00 - 100.00 ng/mL Final  Lab on 10/31/2023  Component Date Value Ref Range Status   WBC 10/31/2023 3.2 (L)  4.0 - 10.5 K/uL Final    RBC 10/31/2023 3.67 (L)  4.22 - 5.81 Mil/uL Final   Hemoglobin 10/31/2023 10.7 (L)  13.0 - 17.0 g/dL Final   HCT 29/52/8413 31.8 (L)  39.0 - 52.0 % Final   MCV 10/31/2023 86.7  78.0 - 100.0 fl Final   MCHC 10/31/2023 33.7  30.0 - 36.0 g/dL Final   RDW 24/40/1027 13.7  11.5 - 15.5 % Final   Platelets 10/31/2023 243.0  150.0 - 400.0 K/uL Final   Neutrophils Relative % 10/31/2023 66.1  43.0 - 77.0 % Final   Lymphocytes Relative 10/31/2023 15.4  12.0 - 46.0 % Final   Monocytes Relative 10/31/2023 14.4 (H)  3.0 - 12.0 % Final   Eosinophils Relative 10/31/2023 3.4  0.0 - 5.0 % Final   Basophils Relative 10/31/2023 0.7  0.0 - 3.0 % Final   Neutro Abs 10/31/2023 2.1  1.4 - 7.7 K/uL Final   Lymphs Abs 10/31/2023 0.5 (L)  0.7 - 4.0 K/uL Final   Monocytes Absolute 10/31/2023 0.5  0.1 - 1.0 K/uL Final   Eosinophils Absolute 10/31/2023 0.1  0.0 - 0.7 K/uL Final   Basophils Absolute 10/31/2023 0.0  0.0 - 0.1 K/uL Final  Office Visit on 10/28/2023  Component Date Value Ref Range Status   WBC 10/28/2023 1.7 Repeated and verified X2. (LL)  4.0 - 10.5 K/uL Final   RBC 10/28/2023 3.35 (L)  4.22 - 5.81 Mil/uL Final   Hemoglobin 10/28/2023 10.0 (L)  13.0 - 17.0 g/dL Final   HCT 25/36/6440 29.4 (L)  39.0 - 52.0 % Final   MCV 10/28/2023 87.7  78.0 - 100.0 fl Final   MCHC 10/28/2023 33.8  30.0 - 36.0 g/dL Final   RDW 34/74/2595 14.3  11.5 - 15.5 % Final   Platelets 10/28/2023 236.0  150.0 - 400.0 K/uL Final   Neutrophils Relative % 10/28/2023 24.8 (L)  43.0 - 77.0 % Final   Lymphocytes Relative 10/28/2023 36.8  12.0 - 46.0 % Final   Monocytes Relative 10/28/2023 30.5 Repeated and verified X2. (H)  3.0 - 12.0 % Final   Eosinophils Relative 10/28/2023 7.9 (H)  0.0 - 5.0 % Final   Basophils Relative 10/28/2023 0.0  0.0 - 3.0 % Final   Neutro Abs 10/28/2023 0.4 (L)  1.4 - 7.7 K/uL Final   Lymphs Abs 10/28/2023 0.6 (L)  0.7 - 4.0 K/uL Final   Monocytes Absolute 10/28/2023 0.5  0.1 - 1.0 K/uL Final    Eosinophils Absolute 10/28/2023 0.1  0.0 - 0.7 K/uL Final   Basophils Absolute 10/28/2023 0.0  0.0 - 0.1 K/uL Final   Sodium 10/28/2023 133 (L)  135 - 145 mEq/L Final   Potassium 10/28/2023 4.5  3.5 - 5.1 mEq/L Final   Chloride 10/28/2023 103  96 - 112 mEq/L Final   CO2 10/28/2023 26  19 - 32 mEq/L Final   Glucose, Bld 10/28/2023 115 (H)  70 - 99 mg/dL Final   BUN 63/87/5643 19  6 - 23 mg/dL Final   Creatinine, Ser 10/28/2023 0.70  0.40 - 1.50 mg/dL Final   Total Bilirubin 10/28/2023 0.3  0.2 - 1.2 mg/dL Final   Alkaline Phosphatase 10/28/2023 64  39 - 117 U/L Final   AST 10/28/2023 14  0 - 37 U/L Final   ALT 10/28/2023 7  0 - 53 U/L Final   Total Protein 10/28/2023 5.4 (L)  6.0 - 8.3 g/dL Final   Albumin  10/28/2023 3.3 (L)  3.5 - 5.2 g/dL Final   GFR 29/56/2130 84.90  >60.00 mL/min Final   Calcium  10/28/2023 8.3 (L)  8.4 - 10.5 mg/dL Final   Magnesium 86/57/8469 2.0  1.5 - 2.5 mg/dL Final   Color, Urine 62/95/2841 YELLOW  YELLOW Final   APPearance 10/28/2023 CLEAR  CLEAR Final   Specific Gravity, Urine 10/28/2023 1.009  1.001 - 1.035 Final   pH 10/28/2023 5.5  5.0 - 8.0 Final   Glucose, UA 10/28/2023 NEGATIVE  NEGATIVE Final   Bilirubin Urine 10/28/2023 NEGATIVE  NEGATIVE Final   Ketones, ur 10/28/2023 NEGATIVE  NEGATIVE Final   Hgb urine dipstick 10/28/2023 2+ (A)  NEGATIVE Final   Protein, ur 10/28/2023 NEGATIVE  NEGATIVE Final   Nitrites, Initial 10/28/2023 NEGATIVE  NEGATIVE Final   Leukocyte Esterase 10/28/2023 TRACE (A)  NEGATIVE Final   WBC, UA 10/28/2023 0-5  0 - 5 /HPF Final   RBC / HPF 10/28/2023 10-20 (A)  0 - 2 /HPF Final   Squamous Epithelial / HPF 10/28/2023 NONE SEEN  < OR = 5 /HPF Final   Bacteria, UA 10/28/2023 NONE SEEN  NONE SEEN /HPF Final   Hyaline Cast 10/28/2023 NONE SEEN  NONE SEEN /LPF Final   Note 10/28/2023    Final   MICRO NUMBER: 10/28/2023 [id number]   Final   SPECIMEN QUALITY: 10/28/2023 Adequate   Final   Sample Source 10/28/2023 URINE   Final    STATUS: 10/28/2023 FINAL   Final   Result: 10/28/2023 No Growth   Final   REFLEXIVE URINE CULTURE 10/28/2023    Final  Admission on 10/10/2023, Discharged on 10/22/2023  Component Date Value Ref Range Status   Sodium 10/11/2023 130 (L)  135 - 145 mmol/L Final   Potassium 10/11/2023 4.0  3.5 - 5.1 mmol/L Final   Chloride 10/11/2023 99  98 - 111 mmol/L Final   CO2 10/11/2023 24  22 - 32 mmol/L Final   Glucose, Bld 10/11/2023 112 (H)  70 - 99 mg/dL Final   BUN 72/53/6644 21  8 - 23 mg/dL Final   Creatinine, Ser 10/11/2023 0.87  0.61 - 1.24 mg/dL Final   Calcium  10/11/2023 8.3 (L)  8.9 - 10.3 mg/dL Final   Total Protein 03/47/4259 5.2 (L)  6.5 - 8.1 g/dL Final   Albumin  10/11/2023 2.9 (L)  3.5 - 5.0 g/dL Final   AST 56/38/7564 22  15 - 41 U/L Final   ALT 10/11/2023 9  0 - 44 U/L Final   Alkaline Phosphatase 10/11/2023 48  38 - 126 U/L Final   Total Bilirubin 10/11/2023 0.5  0.0 - 1.2 mg/dL Final   GFR, Estimated 10/11/2023 >60  >60 mL/min Final   Anion gap 10/11/2023 7  5 - 15 Final   WBC 10/11/2023 10.2  4.0 - 10.5 K/uL Final   RBC 10/11/2023 3.34 (L)  4.22 - 5.81 MIL/uL Final   Hemoglobin 10/11/2023 10.1 (L)  13.0 - 17.0 g/dL Final   HCT 33/29/5188 29.4 (L)  39.0 - 52.0 % Final   MCV 10/11/2023 88.0  80.0 - 100.0 fL Final   MCH 10/11/2023 30.2  26.0 - 34.0 pg Final   MCHC 10/11/2023 34.4  30.0 - 36.0 g/dL Final   RDW 41/66/0630 13.6  11.5 - 15.5 % Final   Platelets 10/11/2023 235  150 - 400 K/uL Final   nRBC  10/11/2023 0.0  0.0 - 0.2 % Final   Neutrophils Relative % 10/11/2023 77  % Final   Neutro Abs 10/11/2023 7.8 (H)  1.7 - 7.7 K/uL Final   Lymphocytes Relative 10/11/2023 10  % Final   Lymphs Abs 10/11/2023 1.0  0.7 - 4.0 K/uL Final   Monocytes Relative 10/11/2023 11  % Final   Monocytes Absolute 10/11/2023 1.1 (H)  0.1 - 1.0 K/uL Final   Eosinophils Relative 10/11/2023 0  % Final   Eosinophils Absolute 10/11/2023 0.0  0.0 - 0.5 K/uL Final   Basophils Relative 10/11/2023 0  %  Final   Basophils Absolute 10/11/2023 0.0  0.0 - 0.1 K/uL Final   Immature Granulocytes 10/11/2023 2  % Final   Abs Immature Granulocytes 10/11/2023 0.21 (H)  0.00 - 0.07 K/uL Final   Sodium 10/12/2023 131 (L)  135 - 145 mmol/L Final   Potassium 10/12/2023 4.3  3.5 - 5.1 mmol/L Final   Chloride 10/12/2023 101  98 - 111 mmol/L Final   CO2 10/12/2023 21 (L)  22 - 32 mmol/L Final   Glucose, Bld 10/12/2023 109 (H)  70 - 99 mg/dL Final   BUN 08/65/7846 15  8 - 23 mg/dL Final   Creatinine, Ser 10/12/2023 0.68  0.61 - 1.24 mg/dL Final   Calcium  10/12/2023 8.2 (L)  8.9 - 10.3 mg/dL Final   GFR, Estimated 10/12/2023 >60  >60 mL/min Final   Anion gap 10/12/2023 9  5 - 15 Final   Magnesium 10/12/2023 1.9  1.7 - 2.4 mg/dL Final   Sodium 96/29/5284 132 (L)  135 - 145 mmol/L Final   Potassium 10/14/2023 4.1  3.5 - 5.1 mmol/L Final   Chloride 10/14/2023 100  98 - 111 mmol/L Final   CO2 10/14/2023 25  22 - 32 mmol/L Final   Glucose, Bld 10/14/2023 87  70 - 99 mg/dL Final   BUN 13/24/4010 16  8 - 23 mg/dL Final   Creatinine, Ser 10/14/2023 0.82  0.61 - 1.24 mg/dL Final   Calcium  10/14/2023 8.2 (L)  8.9 - 10.3 mg/dL Final   GFR, Estimated 10/14/2023 >60  >60 mL/min Final   Anion gap 10/14/2023 7  5 - 15 Final   WBC 10/14/2023 6.0  4.0 - 10.5 K/uL Final   RBC 10/14/2023 3.23 (L)  4.22 - 5.81 MIL/uL Final   Hemoglobin 10/14/2023 9.7 (L)  13.0 - 17.0 g/dL Final   HCT 27/25/3664 29.0 (L)  39.0 - 52.0 % Final   MCV 10/14/2023 89.8  80.0 - 100.0 fL Final   MCH 10/14/2023 30.0  26.0 - 34.0 pg Final   MCHC 10/14/2023 33.4  30.0 - 36.0 g/dL Final   RDW 40/34/7425 13.8  11.5 - 15.5 % Final   Platelets 10/14/2023 220  150 - 400 K/uL Final   nRBC 10/14/2023 0.0  0.0 - 0.2 % Final   Sodium 10/17/2023 130 (L)  135 - 145 mmol/L Final   Potassium 10/17/2023 4.3  3.5 - 5.1 mmol/L Final   Chloride 10/17/2023 101  98 - 111 mmol/L Final   CO2 10/17/2023 24  22 - 32 mmol/L Final   Glucose, Bld 10/17/2023 97  70 - 99  mg/dL Final   BUN 95/63/8756 20  8 - 23 mg/dL Final   Creatinine, Ser 10/17/2023 0.76  0.61 - 1.24 mg/dL Final   Calcium  10/17/2023 8.4 (L)  8.9 - 10.3 mg/dL Final   GFR, Estimated 10/17/2023 >60  >60 mL/min Final   Anion gap 10/17/2023  5  5 - 15 Final   WBC 10/17/2023 8.8  4.0 - 10.5 K/uL Final   RBC 10/17/2023 3.37 (L)  4.22 - 5.81 MIL/uL Final   Hemoglobin 10/17/2023 10.2 (L)  13.0 - 17.0 g/dL Final   HCT 16/02/9603 30.2 (L)  39.0 - 52.0 % Final   MCV 10/17/2023 89.6  80.0 - 100.0 fL Final   MCH 10/17/2023 30.3  26.0 - 34.0 pg Final   MCHC 10/17/2023 33.8  30.0 - 36.0 g/dL Final   RDW 54/01/8118 14.0  11.5 - 15.5 % Final   Platelets 10/17/2023 231  150 - 400 K/uL Final   nRBC 10/17/2023 0.0  0.0 - 0.2 % Final   Osmolality 10/17/2023 285  275 - 295 mOsm/kg Final   Osmolality, Ur 10/17/2023 761  300 - 900 mOsm/kg Final   Sodium, Ur 10/17/2023 19  mmol/L Final   Sodium 10/18/2023 132 (L)  135 - 145 mmol/L Final   Potassium 10/18/2023 4.3  3.5 - 5.1 mmol/L Final   Chloride 10/18/2023 101  98 - 111 mmol/L Final   CO2 10/18/2023 24  22 - 32 mmol/L Final   Glucose, Bld 10/18/2023 98  70 - 99 mg/dL Final   BUN 14/78/2956 21  8 - 23 mg/dL Final   Creatinine, Ser 10/18/2023 0.75  0.61 - 1.24 mg/dL Final   Calcium  10/18/2023 8.1 (L)  8.9 - 10.3 mg/dL Final   GFR, Estimated 10/18/2023 >60  >60 mL/min Final   Anion gap 10/18/2023 7  5 - 15 Final   Specimen Description 10/18/2023 URINE, CATHETERIZED   Final   Special Requests 10/18/2023    Final                   Value:NONE Performed at [facility], [address], [place], [place] [address]    Culture 10/18/2023 40,000 COLONIES/mL PSEUDOMONAS AERUGINOSA (A)   Final   Report Status 10/18/2023 10/20/2023 FINAL   Final   Organism ID, Bacteria 10/18/2023 PSEUDOMONAS AERUGINOSA (A)   Final   Color, Urine 10/18/2023 YELLOW  YELLOW Final   APPearance 10/18/2023 HAZY (A)  CLEAR Final   Specific Gravity, Urine 10/18/2023 1.014  1.005 -  1.030 Final   pH 10/18/2023 6.0  5.0 - 8.0 Final   Glucose, UA 10/18/2023 NEGATIVE  NEGATIVE mg/dL Final   Hgb urine dipstick 10/18/2023 SMALL (A)  NEGATIVE Final   Bilirubin Urine 10/18/2023 NEGATIVE  NEGATIVE Final   Ketones, ur 10/18/2023 NEGATIVE  NEGATIVE mg/dL Final   Protein, ur 65/78/4696 30 (A)  NEGATIVE mg/dL Final   Nitrite 29/52/8413 NEGATIVE  NEGATIVE Final   Leukocytes,Ua 10/18/2023 LARGE (A)  NEGATIVE Final   RBC / HPF 10/18/2023 0-5  0 - 5 RBC/hpf Final   WBC, UA 10/18/2023 >50  0 - 5 WBC/hpf Final   Bacteria, UA 10/18/2023 RARE (A)  NONE SEEN Final   Squamous Epithelial / HPF 10/18/2023 0-5  0 - 5 /HPF Final   Mucus 10/18/2023 PRESENT   Final   Sodium 10/19/2023 134 (L)  135 - 145 mmol/L Final   Potassium 10/19/2023 4.5  3.5 - 5.1 mmol/L Final   Chloride 10/19/2023 102  98 - 111 mmol/L Final   CO2 10/19/2023 25  22 - 32 mmol/L Final   Glucose, Bld 10/19/2023 100 (H)  70 - 99 mg/dL Final   BUN 24/40/1027 18  8 - 23 mg/dL Final   Creatinine, Ser 10/19/2023 0.87  0.61 - 1.24 mg/dL Final  Calcium  10/19/2023 8.2 (L)  8.9 - 10.3 mg/dL Final   GFR, Estimated 10/19/2023 >60  >60 mL/min Final   Anion gap 10/19/2023 7  5 - 15 Final   Sodium 10/20/2023 132 (L)  135 - 145 mmol/L Final   Potassium 10/20/2023 4.3  3.5 - 5.1 mmol/L Final   Chloride 10/20/2023 105  98 - 111 mmol/L Final   CO2 10/20/2023 22  22 - 32 mmol/L Final   Glucose, Bld 10/20/2023 91  70 - 99 mg/dL Final   BUN 16/02/9603 22  8 - 23 mg/dL Final   Creatinine, Ser 10/20/2023 1.02  0.61 - 1.24 mg/dL Final   Calcium  10/20/2023 7.8 (L)  8.9 - 10.3 mg/dL Final   GFR, Estimated 10/20/2023 >60  >60 mL/min Final   Anion gap 10/20/2023 5  5 - 15 Final   Sodium 10/21/2023 132 (L)  135 - 145 mmol/L Final   Potassium 10/21/2023 4.5  3.5 - 5.1 mmol/L Final   Chloride 10/21/2023 103  98 - 111 mmol/L Final   CO2 10/21/2023 25  22 - 32 mmol/L Final   Glucose, Bld 10/21/2023 96  70 - 99 mg/dL Final   BUN 54/01/8118 18  8  - 23 mg/dL Final   Creatinine, Ser 10/21/2023 0.91  0.61 - 1.24 mg/dL Final   Calcium  10/21/2023 8.0 (L)  8.9 - 10.3 mg/dL Final   GFR, Estimated 10/21/2023 >60  >60 mL/min Final   Anion gap 10/21/2023 4 (L)  5 - 15 Final   WBC 10/21/2023 4.3  4.0 - 10.5 K/uL Final   RBC 10/21/2023 3.12 (L)  4.22 - 5.81 MIL/uL Final   Hemoglobin 10/21/2023 9.3 (L)  13.0 - 17.0 g/dL Final   HCT 14/78/2956 27.8 (L)  39.0 - 52.0 % Final   MCV 10/21/2023 89.1  80.0 - 100.0 fL Final   MCH 10/21/2023 29.8  26.0 - 34.0 pg Final   MCHC 10/21/2023 33.5  30.0 - 36.0 g/dL Final   RDW 21/30/8657 13.8  11.5 - 15.5 % Final   Platelets 10/21/2023 199  150 - 400 K/uL Final   nRBC 10/21/2023 0.0  0.0 - 0.2 % Final   Sodium 10/21/2023 133 (L)  135 - 145 mmol/L Final   Potassium 10/21/2023 4.1  3.5 - 5.1 mmol/L Final   Chloride 10/21/2023 103  98 - 111 mmol/L Final   CO2 10/21/2023 22  22 - 32 mmol/L Final   Glucose, Bld 10/21/2023 102 (H)  70 - 99 mg/dL Final   BUN 84/69/6295 18  8 - 23 mg/dL Final   Creatinine, Ser 10/21/2023 0.84  0.61 - 1.24 mg/dL Final   Calcium  10/21/2023 8.4 (L)  8.9 - 10.3 mg/dL Final   GFR, Estimated 10/21/2023 >60  >60 mL/min Final   Anion gap 10/21/2023 8  5 - 15 Final   Sodium 10/22/2023 133 (L)  135 - 145 mmol/L Final   Potassium 10/22/2023 4.1  3.5 - 5.1 mmol/L Final   Chloride 10/22/2023 103  98 - 111 mmol/L Final   CO2 10/22/2023 21 (L)  22 - 32 mmol/L Final   Glucose, Bld 10/22/2023 123 (H)  70 - 99 mg/dL Final   BUN 28/41/3244 18  8 - 23 mg/dL Final   Creatinine, Ser 10/22/2023 0.80  0.61 - 1.24 mg/dL Final   Calcium  10/22/2023 8.3 (L)  8.9 - 10.3 mg/dL Final   GFR, Estimated 10/22/2023 >60  >60 mL/min Final   Anion gap 10/22/2023 9  5 - 15  Final  No results displayed because visit has over 200 results.    No image results found. DG Abd 2 Views Result Date: 10/13/2023 CLINICAL DATA:  Ileus. EXAM: ABDOMEN - 2 VIEW COMPARISON:  Radiograph yesterday FINDINGS: No change in the  marked gaseous colonic distension particularly in the left hemiabdomen. Moderate stool is seen in the ascending colon. No convincing small bowel dilatation on the current exam. No evidence of free air. IMPRESSION: No change in marked gaseous colonic distension, typical of colonic ileus. Electronically Signed   By: Chadwick Colonel M.D.   On: 10/13/2023 12:30   DG Abd 1 View Result Date: 10/12/2023 CLINICAL DATA:  Follow-up ileus. EXAM: ABDOMEN - 1 VIEW COMPARISON:  10/10/2023 FINDINGS: Diffuse colonic dilatation shows no significant change compared to prior study. No dilated small bowel loops seen. These findings are consistent with severe colonic ileus. IMPRESSION: No significant change in severe colonic ileus. Electronically Signed   By: Marlyce Sine M.D.   On: 10/12/2023 10:43   DG Abd 1 View Result Date: 10/10/2023 CLINICAL DATA:  Abdominal distension EXAM: ABDOMEN - 1 VIEW COMPARISON:  09/30/2017 FINDINGS: Two supine frontal views of the abdomen and pelvis are obtained. There is marked diffuse gaseous distention of the colon most compatible with adynamic ileus. No radiographic findings to suggest volvulus. No evidence of bowel obstruction. No masses or abnormal calcifications. No acute bony abnormalities. IMPRESSION: 1. Marked diffuse gaseous distention of the colon, most consistent with adynamic ileus. No radiographic evidence of recurrent sigmoid volvulus. Electronically Signed   By: Bobbye Burrow M.D.   On: 10/10/2023 19:48   MR BRAIN W WO CONTRAST Result Date: 10/09/2023 CLINICAL DATA:  Acute neurologic deficit EXAM: MRI HEAD WITHOUT AND WITH CONTRAST TECHNIQUE: Multiplanar, multiecho pulse sequences of the brain and surrounding structures were obtained without and with intravenous contrast. CONTRAST:  7.5mL GADAVIST  GADOBUTROL  1 MMOL/ML IV SOLN COMPARISON:  09/30/2023 FINDINGS: Brain: Status post resection of anterior left convexity meningioma. There are blood products throughout the resection  cavity. No residual contrast enhancing mass. Left convexity subdural hematoma measures 7 mm in thickness. 2 mm posterior right convexity subdural hematoma. There is moderate edema within the anterior left frontal white matter. Generalized volume loss. There is rightward midline shift of approximately 9 mm, improved. Vascular: Normal flow voids. Skull and upper cervical spine: Normal calvarium and skull base. Visualized upper cervical spine and soft tissues are normal. Sinuses/Orbits:No paranasal sinus fluid levels or advanced mucosal thickening. No mastoid or middle ear effusion. Normal orbits. IMPRESSION: 1. Status post resection of anterior left convexity meningioma. No residual contrast enhancing mass. 2. Left convexity subdural hematoma measures 7 mm in thickness. 2 mm posterior right convexity subdural hematoma. 3. Rightward midline shift of approximately 9 mm, improved. Electronically Signed   By: Juanetta Nordmann M.D.   On: 10/09/2023 22:35   EEG adult Result Date: 10/09/2023 Arleene Lack, MD     10/09/2023  5:42 PM Patient Name: PIPER ALBRO MRN: [id number] Epilepsy Attending: Arleene Lack Referring Physician/Provider: Imogene Mana, NP Date: 10/09/2023 Duration: 22.16 mins Patient history: 82yo m with ams. EEG to evaluate for seizure Level of alertness: Awake AEDs during EEG study: LEV Technical aspects: This EEG study was done with scalp electrodes positioned according to the 10-20 International system of electrode placement. Electrical activity was reviewed with band pass filter of 1-70Hz , sensitivity of 7 uV/mm, display speed of 51mm/sec with a 60Hz  notched filter applied as appropriate. EEG data were recorded continuously and digitally  stored.  Video monitoring was available and reviewed as appropriate. Description: The posterior dominant rhythm consists of 8 Hz activity of moderate voltage (25-35 uV) seen predominantly in posterior head regions, asymmetric ( left<right) and reactive to eye  opening and eye closing. EEG showed continuous 3 to 6 Hz theta-delta slowing admixed with 12-14hz  beta activity in left hemisphere. Hyperventilation and photic stimulation were not performed.   ABNORMALITY - Continuous slow, left hemisphere IMPRESSION: This study is suggestive of cortical dysfunction arising from left hemisphere likely secondary to underlying structural abnormality/ SDH. No seizures or epileptiform discharges were seen throughout the recording. Priyanka Suzanne Erps   CT ANGIO HEAD NECK W WO CM (CODE STROKE) Result Date: 10/09/2023 CLINICAL DATA:  Neuro deficit, concern for stroke, slurred speech. EXAM: CT ANGIOGRAPHY HEAD AND NECK WITH AND WITHOUT CONTRAST TECHNIQUE: Multidetector CT imaging of the head and neck was performed using the standard protocol during bolus administration of intravenous contrast. Multiplanar CT image reconstructions and MIPs were obtained to evaluate the vascular anatomy. Carotid stenosis measurements (when applicable) are obtained utilizing NASCET criteria, using the distal internal carotid diameter as the denominator. RADIATION DOSE REDUCTION: This exam was performed according to the departmental dose-optimization program which includes automated exposure control, adjustment of the mA and/or kV according to patient size and/or use of iterative reconstruction technique. CONTRAST:  75mL OMNIPAQUE  IOHEXOL  350 MG/ML SOLN COMPARISON:  Same day CT head.  MRA head 09/30/2023. FINDINGS: CTA NECK FINDINGS Aortic arch: Four vessel configuration of the aortic arch. Imaged portion shows no evidence of aneurysm or dissection. Mild atherosclerosis. No significant stenosis of the major arch vessel origins. Pulmonary arteries: As permitted by contrast timing, there are no filling defects in the visualized pulmonary arteries. Subclavian arteries: The subclavian arteries are patent bilaterally. Right carotid system: No evidence of dissection, stenosis (50% or greater), or occlusion. Bulky  calcified atherosclerosis along the proximal cervical ICA without hemodynamically significant stenosis. Left carotid system: No evidence of dissection, stenosis (50% or greater), or occlusion. Mild atherosclerosis at the carotid bifurcation without stenosis. Vertebral arteries: Right vertebral artery is dominant. Atherosclerosis at the left vertebral artery origin. The vessel origin is somewhat obscured due to artifact. There is likely severe stenosis at the origin of the left vertebral artery. The vertebral arteries are patent from the origins to the vertebrobasilar confluence. No evidence of dissection. Skeleton: Postsurgical changes of the calvarium. No acute or aggressive finding. Sequelae of laminectomy from C4-C6. Degenerative changes in the visualized spine. Other neck: The visualized airway is patent. No cervical lymphadenopathy. Upper chest: Visualized lung apices are clear. Review of the MIP images confirms the above findings CTA HEAD FINDINGS ANTERIOR CIRCULATION: The intracranial ICAs are patent bilaterally. Atherosclerosis of the bilateral carotid siphons. Mild stenosis of the left cavernous ICA. 1.5 mm prominence along the right supraclinoid ICA at the origin of the posterior communicating artery. No high-grade stenosis, proximal occlusion, or vascular malformation. MCAs: The right M1 segment is patent. Severe stenosis of an M2 superior vision branch of the right MCA. The left M1 segment is patent. Left MCA branches are patent. ACAs: Patent bilaterally. Accounting for differences in modality, there is slightly decreased mass effect on distal ACA branches per compared to prior MRA. POSTERIOR CIRCULATION: No significant stenosis, proximal occlusion, aneurysm, or vascular malformation. PCAs: Patent bilaterally. The left PCA appears primarily supplied by the posterior communicating artery with contribution from a small P1 segment noted. Pcomm: Visualized bilaterally, larger on the left. SCAs: The superior  cerebellar arteries are patent  bilaterally. Basilar artery: Patent AICAs: None visualized on the right. PICAs: Visualized bilaterally, larger on the left. Vertebral arteries: The intracranial vertebral arteries are patent. Venous sinuses: As permitted by contrast timing, patent. Anatomic variants: None Review of the MIP images confirms the above findings IMPRESSION: No large vessel occlusion. 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 Accounting for differences in modality there is slightly decreased mass effect on the distal ACA branches compared to prior MRA. 1.5 mm outpouching along the right supraclinoid ICA at the origin of the posterior communicating artery which may reflect an infundibular origin versus less likely small aneurysm. Atherosclerosis in the neck as above without high-grade stenosis. Focal severe stenosis at the origin of the non dominant left vertebral artery on the aortic arch. Electronically Signed   By: Denny Flack M.D.   On: 10/09/2023 13:13   CT HEAD CODE STROKE WO CONTRAST Result Date: 10/09/2023 CLINICAL DATA:  Code stroke. Neuro deficit, concern for stroke. Slurred speech. EXAM: CT HEAD WITHOUT CONTRAST TECHNIQUE: Contiguous axial images were obtained from the base of the skull through the vertex without intravenous contrast. RADIATION DOSE REDUCTION: This exam was performed according to the departmental dose-optimization program which includes automated exposure control, adjustment of the mA and/or kV according to patient size and/or use of iterative reconstruction technique. COMPARISON:  CT head 10/07/2023 and earlier. FINDINGS: Brain: Left frontotemporal craniotomy. Postoperative changes from resection of right frontal lobe mass and evacuation of parenchymal hematoma. Scattered foci of residual parenchymal hemorrhage in the left frontal lobe are overall similar to prior. Minimally increased edema within the left frontal lobe. Redemonstrated mixed  attenuation subdural collection over the left frontal lobe measuring up to 11 mm in thickness similar to prior. Additional small focus of subdural hemorrhage over the right parietal lobe measuring to 3 mm. Additional subdural hemorrhage along the posterior falx measuring up to 3 mm in thickness, similar to prior. No new or enlarging foci of intracranial hemorrhage. Slightly decreased pneumocephalus. Mass effect is overall similar to prior with partial effacement of the left lateral ventricle. Trace blood products within the right occipital horn. Approximately 9 mm rightward midline shift not significantly changed from prior. Vascular: No hyperdense vessel or unexpected calcification. Skull: Postsurgical changes without acute or aggressive finding. Sinuses/Orbits: No acute finding. Other: Mastoid air cells are clear. ASPECTS [facility] Stroke Program Early CT Score) - Ganglionic level infarction (caudate, lentiform nuclei, internal capsule, insula, M1-M3 cortex): 7 - Supraganglionic infarction (M4-M6 cortex): 3 Total score (0-10 with 10 being normal): 10 IMPRESSION: 1. Redemonstrated postsurgical changes of left frontotemporal craniotomy for resection of left frontal lobe mass and parenchymal hematoma evacuation. 2. Similar appearance of multi compartment intracranial hemorrhage. No new or enlarging foci of intracranial hemorrhage. 3. Similar mass effect with approximately 9 mm rightward midline shift. 4. ASPECTS is 10 These results were communicated to Dr. Arora at 12:38 pm on 10/09/2023 by text page via the [facility] messaging system. Electronically Signed   By: Denny Flack M.D.   On: 10/09/2023 12:38   CT HEAD WO CONTRAST ( ) Result Date: 10/07/2023 CLINICAL DATA:  Provided history: Check resolution of shift. EXAM: CT HEAD WITHOUT CONTRAST TECHNIQUE: Contiguous axial images were obtained from the base of the skull through the vertex without intravenous contrast. RADIATION DOSE REDUCTION: This exam was performed  according to the departmental dose-optimization program which includes automated exposure control, adjustment of the mA and/or kV according to patient size and/or use of iterative reconstruction technique. COMPARISON:  Prior head CT examinations 10/04/2023 and earlier. Brain MRI 09/30/2023. FINDINGS: Brain: Continued interval evolution of postoperative changes from recent prior resection of a left frontal mass and from subsequent left frontal parenchymal hematoma evacuation. Small patchy foci of parenchymal hemorrhage within the left frontal lobe (at site of prior left frontal mass resection) are non-progressed. Edema at this site has slightly progressed. Pneumocephalus persistent although decreased pneumocephalus scattered along the left greater than right frontal lobes. Residual mixed density subdural hematoma along the left cerebral convexity, measuring up to 11 mm in thickness (for instance as seen on series 5, image 50). Subdural hemorrhage along the right cerebral convexity measuring up to 3 mm in thickness, present on the prior head CT of 10/04/2023 but better appreciated on today's study (for instance as seen on series 5, image 25). Persistent mass effect with partial effacement of the left lateral ventricle. Rightward midline shift now measures 7-8 mm (previously 9 mm). Persistent trace hemorrhage within the occipital horn of the right lateral ventricle. Unchanged size and configuration of the ventricular system. Subdural hemorrhage along the mid and posterior falx, measuring up to 3 mm in thickness, similar to the prior CT. Vascular: No hyperdense vessel.  Atherosclerotic calcifications. Skull: Left-sided cranioplasty. Sinuses/Orbits: No orbital mass or acute orbital finding. No significant paranasal sinus disease. IMPRESSION: 1. Continued interval evolution of postoperative changes from recent left frontal mass resection, and subsequent left frontal parenchymal hematoma evacuation. 2. Patchy foci of  parenchymal hemorrhage within the left frontal lobe, non-progressed. Surrounding parenchymal edema has slightly progressed. 3. Residual mixed density subdural hematoma along the left cerebral convexity, measuring up to 11 mm in thickness. 4. Persistent mass effect with partial effacement of the left lateral ventricle. Decreased rightward midline shift now measuring 7-8 mm (previously 9 mm). 5. Thin subdural hematoma along the right cerebral convexity (measuring up to 3 mm in thickness), non-progressed. 6. Trace hemorrhage within the right lateral ventricle occipital horn, unchanged. 7. Subdural hemorrhage along the mid and posterior falx (measuring up to 3 mm in thickness), also not significantly changed. Electronically Signed   By: Bascom Lily D.O.   On: 10/07/2023 15:01   CT HEAD WO CONTRAST ( ) Result Date: 10/04/2023 CLINICAL DATA:  Follow-up status post craniotomy. EXAM: CT HEAD WITHOUT CONTRAST TECHNIQUE: Contiguous axial images were obtained from the base of the skull through the vertex without intravenous contrast. RADIATION DOSE REDUCTION: This exam was performed according to the departmental dose-optimization program which includes automated exposure control, adjustment of the mA and/or kV according to patient size and/or use of iterative reconstruction technique. COMPARISON:  CT head earlier same day, MRI head 09/30/2023. FINDINGS: Brain: Postsurgical changes of right frontotemporal craniotomy. Interval increase in pneumocephalus over the left frontal lobe and new component of pneumocephalus over the anterior right frontal lobe and right temporal lobe. Mixed attenuation subdural collection now measures up to 10 mm in thickness with decreased component of hyperattenuating acute blood products compared to earlier same day CT. Region of patchy parenchymal hemorrhage is significantly decreased from prior with few scattered areas of hemorrhage noted the largest of which measures up to 12 mm. Similar edema  in the left frontal lobe. There is decreased mass effect compared to prior now with up to 9 mm of rightward midline shift, previously 14 mm. Additional component of subdural hemorrhage posteriorly along the falx measuring up to 3 mm in thickness likely reflecting slight redistribution of blood products. Decreased mass effect on the left lateral ventricle. Slightly decreased caliber of the  right lateral ventricle. Decreased mass effect on the third ventricle also noted. Trace blood products within the right occipital horn. Vascular: Atherosclerotic calcifications of the carotid siphons and intracranial vertebral arteries. No hyperdense vessel. Skull: Postsurgical changes without acute or aggressive finding. Sinuses/Orbits: Orbits are symmetric. No significant mucosal thickening in the paranasal sinuses. Other: Mastoid air cells are clear. Interval placement of surgical drain within the left frontoparietal scalp with tip overlying the anterior aspect of craniotomy site. There is slightly increased intramuscular emphysema over the left frontotemporal scalp. IMPRESSION: Interval evacuation of intraparenchymal hemorrhage in the left frontal lobe. Scattered residual foci of parenchymal hemorrhage the largest of which measures up to 12 mm. Decreased mass effect now with 9 mm midline shift, previously 14 mm. Decreased subdural hematoma over the left cerebral convexity. Component of subdural hemorrhage along the posterior falx is minimally increased measuring up to 3 mm likely reflecting redistribution of blood products. Improved caliber of the ventricles as above. Trace blood products now noted in the right occipital horn. Increased pneumocephalus as above. Electronically Signed   By: Denny Flack M.D.   On: 10/04/2023 16:45   CT HEAD WO CONTRAST ( ) Result Date: 10/04/2023 CLINICAL DATA:  Weakness and altered mental status EXAM: CT HEAD WITHOUT CONTRAST TECHNIQUE: Contiguous axial images were obtained from the base of  the skull through the vertex without intravenous contrast. RADIATION DOSE REDUCTION: This exam was performed according to the departmental dose-optimization program which includes automated exposure control, adjustment of the mA and/or kV according to patient size and/or use of iterative reconstruction technique. COMPARISON:  Head CT and brain MRI from 4 days prior FINDINGS: Brain: Recent resection of left frontal mass. Subdural gas and hemorrhage measuring up to 11 mm in thickness. There is also patchy parenchymal hemorrhage at the level of prior left frontal mass effect, left frontal hemorrhage measuring up to 4 cm in length on coronal reformats. Midline shift measures 14 mm, mildly increased from preoperative. No detected infarct. Unchanged degree of right lateral ventriculomegaly. Vascular: Negative Skull: Unremarkable left-sided craniotomy. Sinuses/Orbits: Negative Other: Case discussed with Dr. Ellery Guthrie. IMPRESSION: Blood products in the left frontal lobe and spreading along the left subdural space, subdural collection measuring up to 11 mm in thickness. Midline shift is now 14 mm, mildly increased from preoperative exam. Electronically Signed   By: Ronnette Coke M.D.   On: 10/04/2023 06:55   MR BRAIN W WO CONTRAST Result Date: 09/30/2023 CLINICAL DATA:  Brain/CNS neoplasm, staging EXAM: MRI HEAD WITHOUT AND WITH CONTRAST TECHNIQUE: Multiplanar, multiecho pulse sequences of the brain and surrounding structures were obtained without and with intravenous contrast. CONTRAST:  8mL GADAVIST  GADOBUTROL  1 MMOL/ML IV SOLN COMPARISON:  MRI head September 04, 2017. FINDINGS: Brain: Large (6.1 x 4.8 x 4.7 cm) enhancing and heterogeneous/hemorrhagic extra-axial dural-based mass along the left frontal convexity. Surrounding edema and mass effect with up to 1.3 cm rightward midline shift. Dural thickening and fluid collections along the left greater than right cerebral convexities, measuring up to 6 mm on the left and trace  on the right. No evidence of acute infarct or hydrocephalus. Vascular: Major arterial flow voids are maintained skull base. Skull and upper cervical spine: Normal marrow signal. Sinuses/Orbits: Clear sinuses.  No acute orbital findings. IMPRESSION: 1. 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 Less likely considerations include hemangiopericytoma or dural metastasis. This may represent interval growth of the lesion seen on 2019 MRI. 2. Resulting 1.3 cm of  rightward midline shift. 3. Left larger than right (6 mm in thickness on the left) cerebral convexity subdural collections, most likely subdural hematomas. Electronically Signed   By: Stevenson Elbe M.D.   On: 09/30/2023 19:55   EEG adult Result Date: 09/30/2023 Arleene Lack, MD     09/30/2023  4:49 PM Patient Name: DEZMON CONOVER MRN: [id number] Epilepsy Attending: Arleene Lack Referring Physician/Provider: Lena Qualia, MD Date: 09/30/2023 Duration: 25.54 mins Patient history: 83yo M with left frontal extra axial lesion. EEG to evaluate for seizure Level of alertness: Awake AEDs during EEG study: None Technical aspects: This EEG study was done with scalp electrodes positioned according to the 10-20 International system of electrode placement. Electrical activity was reviewed with band pass filter of 1-70Hz , sensitivity of 7 uV/mm, display speed of 81mm/sec with a 60Hz  notched filter applied as appropriate. EEG data were recorded continuously and digitally stored.  Video monitoring was available and reviewed as appropriate. Description: The posterior dominant rhythm consists of 9 Hz activity of moderate voltage (25-35 uV) seen predominantly in posterior head regions, symmetric and reactive to eye opening and eye closing. EEG showed continuous 3 to 6 Hz theta-delta slowing in left fronto-centro-temporal region. Hyperventilation and photic stimulation were not performed.   ABNORMALITY  - Continuous slow, left fronto-centro-temporal region IMPRESSION: This study is suggestive of cortical dysfunction arising from left fronto-centro-temporal region likely secondary to underlying structural abnormality. No seizures or epileptiform discharges were seen throughout the recording. Priyanka Suzanne Erps   MR ANGIO HEAD WO CONTRAST Result Date: 09/30/2023 CLINICAL DATA:  Neuro deficit, concern for stroke. EXAM: MRA HEAD WITHOUT CONTRAST TECHNIQUE: Angiographic images of the Circle of Willis were acquired using MRA technique without intravenous contrast. COMPARISON:  Earlier same day CT head.  MRI head 09/04/2017. FINDINGS: Anterior circulation: The intracranial internal carotid arteries are patent and appear normal in caliber. No evidence of high-grade stenosis. The M1 segment of the right MCA is patent. Right MCA bifurcation is patent. Patent M2 branches of the right MCA. Distal right MCA branches are patent. The left M1 segment is patent. Normal appearance of the left MCA bifurcation. Left M2 branches are patent. Distal left MCA branches are patent. The A1 and A2 segments are patent bilaterally. Distal ACA branches are patent. There is subtle mass effect on A4 and A5 branches bilaterally due to large extra-axial mass over the left frontal lobe. Posterior circulation: The visualized intracranial vertebral arteries are patent. Right vertebral artery is dominant. The basilar artery is patent. Posterior communicating arteries noted bilaterally. The PCAs are patent bilaterally. The superior cerebellar arteries are patent bilaterally. AICA visualized on the right. PICA visualized bilaterally, more pronounced on the left. Anatomic variants: None significant. Other: Redemonstrated large extra-axial mass overlying the left frontal lobe. Left subdural collection better evaluated on same day head CT. IMPRESSION: Patent intracranial arterial vasculature. Mild mass effect on A4 and A5 branches of the ACA is secondary to  large extra-axial mass over the left frontal lobe. No high-grade stenosis. Electronically Signed   By: Denny Flack M.D.   On: 09/30/2023 13:24   CT HEAD WO CONTRAST Addendum Date: 09/30/2023 ADDENDUM REPORT: 09/30/2023 03:26 ADDENDUM: Findings discussed with Dr. Monique Ano via telephone at 3:26 a.m. Electronically Signed   By: Stevenson Elbe M.D.   On: 09/30/2023 03:26   Result Date: 09/30/2023 CLINICAL DATA:  Polytrauma, blunt; Polytrauma, penetrating EXAM: CT HEAD WITHOUT CONTRAST CT MAXILLOFACIAL WITHOUT CONTRAST CT CERVICAL SPINE WITHOUT CONTRAST TECHNIQUE: Multidetector CT imaging of  the head, cervical spine, and maxillofacial structures were performed using the standard protocol without intravenous contrast. Multiplanar CT image reconstructions of the cervical spine and maxillofacial structures were also generated. RADIATION DOSE REDUCTION: This exam was performed according to the departmental dose-optimization program which includes automated exposure control, adjustment of the mA and/or kV according to patient size and/or use of iterative reconstruction technique. COMPARISON:  MRI September 04, 2017 FINDINGS: CT HEAD FINDINGS Brain: Large (approximately 6.5 cm) heterogeneous and hemorrhagic extra-axial mass along the left frontal convexity with significant mass effect and approximately 1.5 cm of rightward midline shift. Approximately 7 mm thick predominately predominantly low-density subdural fluid collection along the left cerebral convexity. Small amount of hyperdensity within the collection. No evidence of acute large vascular territory infarct or hydrocephalus. Vascular: Calcific atherosclerosis. Skull: No acute fracture. Other: No mastoid effusions. CT MAXILLOFACIAL FINDINGS Osseous: No fracture or mandibular dislocation. No destructive process. Orbits: Negative. No traumatic or inflammatory finding. Sinuses: Mostly clear. Soft tissues: Negative. CT CERVICAL SPINE FINDINGS Alignment: No substantial  sagittal subluxation. Skull base and vertebrae: No acute fracture. Soft tissues and spinal canal: No prevertebral fluid or swelling. No visible canal hematoma. Disc levels:  Mild for age multilevel degenerative change. Upper chest: Clear sinuses. IMPRESSION: 1. Large (approximately 6.5 cm) heterogeneous and hemorrhagic extra-axial mass along the left frontal convexity with 1.5 cm of rightward midline shift, likely significant increase in size of the lesion seen on 2019 MRI. Recommend MRI head with contrast and neurosurgery consultation. 2. Approximately 7 mm thick predominately predominantly low-density subdural fluid collection along the left cerebral convexity, likely chronic hematoma. Small amount of hyperdensity within the collection is suggestive of acute/recent hemorrhage. 3. No evidence of acute fracture or traumatic malalignment in the cervical spine. Electronically Signed: By: Stevenson Elbe M.D. On: 09/30/2023 03:15   CT Maxillofacial Wo Contrast Addendum Date: 09/30/2023 ADDENDUM REPORT: 09/30/2023 03:26 ADDENDUM: Findings discussed with Dr. Monique Ano via telephone at 3:26 a.m. Electronically Signed   By: Stevenson Elbe M.D.   On: 09/30/2023 03:26   Result Date: 09/30/2023 CLINICAL DATA:  Polytrauma, blunt; Polytrauma, penetrating EXAM: CT HEAD WITHOUT CONTRAST CT MAXILLOFACIAL WITHOUT CONTRAST CT CERVICAL SPINE WITHOUT CONTRAST TECHNIQUE: Multidetector CT imaging of the head, cervical spine, and maxillofacial structures were performed using the standard protocol without intravenous contrast. Multiplanar CT image reconstructions of the cervical spine and maxillofacial structures were also generated. RADIATION DOSE REDUCTION: This exam was performed according to the departmental dose-optimization program which includes automated exposure control, adjustment of the mA and/or kV according to patient size and/or use of iterative reconstruction technique. COMPARISON:  MRI September 04, 2017 FINDINGS: CT HEAD  FINDINGS Brain: Large (approximately 6.5 cm) heterogeneous and hemorrhagic extra-axial mass along the left frontal convexity with significant mass effect and approximately 1.5 cm of rightward midline shift. Approximately 7 mm thick predominately predominantly low-density subdural fluid collection along the left cerebral convexity. Small amount of hyperdensity within the collection. No evidence of acute large vascular territory infarct or hydrocephalus. Vascular: Calcific atherosclerosis. Skull: No acute fracture. Other: No mastoid effusions. CT MAXILLOFACIAL FINDINGS Osseous: No fracture or mandibular dislocation. No destructive process. Orbits: Negative. No traumatic or inflammatory finding. Sinuses: Mostly clear. Soft tissues: Negative. CT CERVICAL SPINE FINDINGS Alignment: No substantial sagittal subluxation. Skull base and vertebrae: No acute fracture. Soft tissues and spinal canal: No prevertebral fluid or swelling. No visible canal hematoma. Disc levels:  Mild for age multilevel degenerative change. Upper chest: Clear sinuses. IMPRESSION: 1. Large (approximately 6.5 cm) heterogeneous and  hemorrhagic extra-axial mass along the left frontal convexity with 1.5 cm of rightward midline shift, likely significant increase in size of the lesion seen on 2019 MRI. Recommend MRI head with contrast and neurosurgery consultation. 2. Approximately 7 mm thick predominately predominantly low-density subdural fluid collection along the left cerebral convexity, likely chronic hematoma. Small amount of hyperdensity within the collection is suggestive of acute/recent hemorrhage. 3. No evidence of acute fracture or traumatic malalignment in the cervical spine. Electronically Signed: By: Stevenson Elbe M.D. On: 09/30/2023 03:15   CT Cervical Spine Wo Contrast Addendum Date: 09/30/2023 ADDENDUM REPORT: 09/30/2023 03:26 ADDENDUM: Findings discussed with Dr. Monique Ano via telephone at 3:26 a.m. Electronically Signed   By: Stevenson Elbe M.D.   On: 09/30/2023 03:26   Result Date: 09/30/2023 CLINICAL DATA:  Polytrauma, blunt; Polytrauma, penetrating EXAM: CT HEAD WITHOUT CONTRAST CT MAXILLOFACIAL WITHOUT CONTRAST CT CERVICAL SPINE WITHOUT CONTRAST TECHNIQUE: Multidetector CT imaging of the head, cervical spine, and maxillofacial structures were performed using the standard protocol without intravenous contrast. Multiplanar CT image reconstructions of the cervical spine and maxillofacial structures were also generated. RADIATION DOSE REDUCTION: This exam was performed according to the departmental dose-optimization program which includes automated exposure control, adjustment of the mA and/or kV according to patient size and/or use of iterative reconstruction technique. COMPARISON:  MRI September 04, 2017 FINDINGS: CT HEAD FINDINGS Brain: Large (approximately 6.5 cm) heterogeneous and hemorrhagic extra-axial mass along the left frontal convexity with significant mass effect and approximately 1.5 cm of rightward midline shift. Approximately 7 mm thick predominately predominantly low-density subdural fluid collection along the left cerebral convexity. Small amount of hyperdensity within the collection. No evidence of acute large vascular territory infarct or hydrocephalus. Vascular: Calcific atherosclerosis. Skull: No acute fracture. Other: No mastoid effusions. CT MAXILLOFACIAL FINDINGS Osseous: No fracture or mandibular dislocation. No destructive process. Orbits: Negative. No traumatic or inflammatory finding. Sinuses: Mostly clear. Soft tissues: Negative. CT CERVICAL SPINE FINDINGS Alignment: No substantial sagittal subluxation. Skull base and vertebrae: No acute fracture. Soft tissues and spinal canal: No prevertebral fluid or swelling. No visible canal hematoma. Disc levels:  Mild for age multilevel degenerative change. Upper chest: Clear sinuses. IMPRESSION: 1. Large (approximately 6.5 cm) heterogeneous and hemorrhagic extra-axial mass along  the left frontal convexity with 1.5 cm of rightward midline shift, likely significant increase in size of the lesion seen on 2019 MRI. Recommend MRI head with contrast and neurosurgery consultation. 2. Approximately 7 mm thick predominately predominantly low-density subdural fluid collection along the left cerebral convexity, likely chronic hematoma. Small amount of hyperdensity within the collection is suggestive of acute/recent hemorrhage. 3. No evidence of acute fracture or traumatic malalignment in the cervical spine. Electronically Signed: By: Stevenson Elbe M.D. On: 09/30/2023 03:15        Assessment & Plan Orthostatic hypotension Hypotension is managed with midodrine  as needed for systolic blood pressure below 130 mmHg. Blood pressure has improved with a reduced midodrine  dosage. Continue using Gatorade to maintain blood pressure due to its electrolyte content. Prescribe midodrine  2.5 mg twice daily as needed and monitor blood pressure before and after walking exercises. Foley catheter in place Per patient request it was offered to be removed but per patient request it was kept in. History urinary retention. Impaired gait and mobility Uses walker, has physical therapy  Chronic constipation Chronic constipation is managed with Colace and senna, with a history of bowel obstruction and partial colectomy. The current regimen is effective, and Miralax  has been discontinued as unnecessary. Prescribe  Colace and senna for a 90-day supply. Vitamin D deficiency Will order lab testing to guide management.  Hypocalcemia Hypocalcemia is possibly related to recent brain injury. Monitoring of calcium  levels is necessary as the brain heals. Continue Gatorade for its electrolyte content to maintain calcium  levels. Order a comprehensive metabolic panel for calcium  levels and evaluate PTH and vitamin D levels to assess calcium  metabolism. Reassess calcium  supplementation after blood work  results. Hyponatremia Hyponatremia is likely secondary to recent brain injury. Sodium levels require monitoring as the brain heals, which should aid in regulation. Continue Gatorade for its electrolyte content to maintain sodium levels. Order a comprehensive metabolic panel to assess sodium levels and reassess sodium chloride  supplementation after blood work results. History of colon resection Updated problem overview for this problem to improve longitudinal management  History of seizure Seizure disorder was previously managed with Keppra , which was discontinued due to hypotension. No seizures have been reported since discontinuation. Consider valproic acid as an alternative, potentially without affecting blood pressure. Consult Dr. Evalee Hila for seizure management. Urinary retention Urinary retention is managed with a Foley catheter, scheduled for change on June 23. Follow up with urologist Dr. Doy Gene on July 15. A history of ureteral occlusion may require further intervention. Anemia, unspecified type Anemia requires further evaluation to determine etiology and management. Order a CBC for assessment. Lab Results  Component Value Date/Time   HGB 11.0 (L) 11/12/2023 11:10 AM   HGB 10.7 (L) 10/31/2023 08:34 AM   HGB 10.0 (L) 10/28/2023 04:24 PM   HGB 9.3 (L) 10/21/2023 06:08 AM   HGB 10.2 (L) 10/17/2023 06:49 AM   Lab Results  Component Value Date/Time   MCV 84.5 11/12/2023 11:10 AM   MCV 86.7 10/31/2023 08:34 AM   MCV 87.7 10/28/2023 04:24 PM   MCV 89.1 10/21/2023 06:08 AM   MCV 89.6 10/17/2023 06:49 AM     General Health Maintenance   Encourage hydration with Gatorade to maintain electrolyte balance and prevent hypotension-related falls.  Follow-up   Ensure continuity of care and monitor progress. Schedule a follow-up appointment in one month and proceed to the lab for blood work today.      Orders Placed in Encounter:   Lab Orders         Comprehensive metabolic panel with  GFR         CBC with Differential/Platelet         Urinalysis w microscopic + reflex cultur         PTH, intact (no Ca)         Vitamin D (25 hydroxy)      Meds ordered this encounter  Medications   senna-docusate (SENOKOT-S) 8.6-50 MG tablet    Sig: Take 2 tablets by mouth daily with breakfast.    Dispense:  60 tablet    Refill:  0   midodrine  (PROAMATINE ) 2.5 MG tablet    Sig: Take 1 tablet (2.5 mg total) by mouth 2 (two) times daily with a meal. With breakfast and lunch only take if systolic blood pressure under 371    Dispense:  60 tablet    Refill:  0       This document was synthesized by artificial intelligence ([employer]) using HIPAA-compliant recording of the clinical interaction;   We discussed the use of AI scribe software for clinical note transcription with the patient, who gave verbal consent to proceed. additional Info: This encounter employed state-of-the-art, real-time, collaborative documentation. The patient actively reviewed and assisted in updating their  electronic medical record on a shared screen, ensuring transparency and facilitating joint problem-solving for the problem list, overview, and plan. This approach promotes accurate, informed care. The treatment plan was discussed and reviewed in detail, including medication safety, potential side effects, and all patient questions. We confirmed understanding and comfort with the plan. Follow-up instructions were established, including contacting the office for any concerns, returning if symptoms worsen, persist, or new symptoms develop, and precautions for potential emergency department visits.

## 2023-11-12 NOTE — Assessment & Plan Note (Signed)
 Hypocalcemia is possibly related to recent brain injury. Monitoring of calcium  levels is necessary as the brain heals. Continue Gatorade for its electrolyte content to maintain calcium  levels. Order a comprehensive metabolic panel for calcium  levels and evaluate PTH and vitamin D levels to assess calcium  metabolism. Reassess calcium  supplementation after blood work results.

## 2023-11-12 NOTE — Assessment & Plan Note (Signed)
 Hypotension is managed with midodrine  as needed for systolic blood pressure below 643 mmHg. Blood pressure has improved with a reduced midodrine  dosage. Continue using Gatorade to maintain blood pressure due to its electrolyte content. Prescribe midodrine  2.5 mg twice daily as needed and monitor blood pressure before and after walking exercises.

## 2023-11-12 NOTE — Assessment & Plan Note (Signed)
 Per patient request it was offered to be removed but per patient request it was kept in. History urinary retention.

## 2023-11-12 NOTE — Assessment & Plan Note (Signed)
 Seizure disorder was previously managed with Keppra , which was discontinued due to hypotension. No seizures have been reported since discontinuation. Consider valproic acid as an alternative, potentially without affecting blood pressure. Consult Dr. Evalee Hila for seizure management.

## 2023-11-13 ENCOUNTER — Telehealth: Payer: Self-pay

## 2023-11-13 ENCOUNTER — Ambulatory Visit: Payer: Self-pay | Admitting: Internal Medicine

## 2023-11-13 LAB — URINALYSIS W MICROSCOPIC + REFLEX CULTURE

## 2023-11-13 LAB — PARATHYROID HORMONE, INTACT (NO CA): PTH: 24 pg/mL (ref 16–77)

## 2023-11-13 NOTE — Telephone Encounter (Signed)
 Called pt wife back in order for bp monitor she is wanting for him at this time. She is calling insurance place and place she would like it from to see if it is covered before provider sends rx.

## 2023-11-14 ENCOUNTER — Telehealth: Payer: Self-pay

## 2023-11-14 NOTE — Telephone Encounter (Signed)
 Spoke with pt wife stated she went to [facility] to get a bp monitor does not no longer need a rx for one.   Copied from CRM [id number]. Topic: General - Other >> Nov 13, 2023  4:26 PM Albertha Alosa wrote: Reason for CRM: Patient wife called in stated she would lie for [facility] to give her a callback

## 2023-11-15 LAB — URINALYSIS W MICROSCOPIC + REFLEX CULTURE
Bacteria, UA: NONE SEEN /HPF
Bilirubin Urine: NEGATIVE
Hgb urine dipstick: NEGATIVE
Ketones, ur: NEGATIVE
Leukocyte Esterase: NEGATIVE
Nitrites, Initial: NEGATIVE
Protein, ur: NEGATIVE
RBC / HPF: NONE SEEN /HPF (ref 0–2)
Specific Gravity, Urine: 1.019 (ref 1.001–1.035)
Squamous Epithelial / HPF: NONE SEEN /HPF (ref <=5)
pH: 5.5 (ref 5.0–8.0)

## 2023-11-15 LAB — NO CULTURE INDICATED

## 2023-11-19 ENCOUNTER — Ambulatory Visit

## 2023-11-19 ENCOUNTER — Other Ambulatory Visit: Payer: Self-pay

## 2023-11-19 DIAGNOSIS — R498 Other voice and resonance disorders: Secondary | ICD-10-CM

## 2023-11-19 DIAGNOSIS — R41841 Cognitive communication deficit: Secondary | ICD-10-CM

## 2023-11-19 DIAGNOSIS — M6281 Muscle weakness (generalized): Secondary | ICD-10-CM | POA: Diagnosis not present

## 2023-11-19 DIAGNOSIS — R471 Dysarthria and anarthria: Secondary | ICD-10-CM

## 2023-11-19 NOTE — Therapy (Signed)
 OUTPATIENT SPEECH LANGUAGE PATHOLOGY EVALUATION   Patient Name: Jesse [name] MRN: [id number] DOB:[birth year], 83 y.o., male Today's Date: 11/19/2023  PCP: Jesus Motto, MD REFERRING PROVIDER: [name] RAMAN, PA-C  END OF SESSION:  End of Session - 11/19/23 1746     Visit Number 1    Number of Visits 17    Date for SLP Re-Evaluation 02/07/24   due to first ST session on 12/18/23   SLP Start Time 1448    SLP Stop Time  1530    SLP Time Calculation (min) 42 min    Activity Tolerance Patient tolerated treatment well          Past Medical History:  Diagnosis Date   Acute encephalopathy 09/30/2023   Arthritis    Atrial fibrillation (HCC)    Complication of anesthesia    Atrial fibrillation during surgery    Dementia (HCC)    Depression    Hard of hearing    Hx of constipation    Osteoporosis    DEXA 11/09/10. Completed treatemtn with Fosamax.   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/rectal tube 09/27/2017 09/27/2017   Past Surgical History:  Procedure Laterality Date   BACK SURGERY     COLONOSCOPY     CRANIOTOMY Left 10/03/2023   Procedure: CRANIOTOMY TUMOR EXCISION LEFT FRONTAL;  Surgeon: [name], MD;  Location: [facility] OR;  Service: Neurosurgery;  Laterality: Left;  Left Frontal Craniotomy for Menigioma   CRANIOTOMY N/A 10/04/2023   Procedure: CRANIOTOMY HEMATOMA EVACUATION SUBDURAL;  Surgeon: [name], MD;  Location: [facility] OR;  Service: Neurosurgery;  Laterality: N/A;  REPEAT   FLEXIBLE SIGMOIDOSCOPY N/A 09/27/2017   Procedure: FLEXIBLE SIGMOIDOSCOPY;  Surgeon: [name], MD;  Location: [facility];  Service: Endoscopy;  Laterality: N/A;   JOINT REPLACEMENT     LAMINECTOMY  10/2011   cervical and lumbar laminectomy   PROSTATE BIOPSY  07/10/2011   PROSTATE BIOPSY  09/22/15   TOTAL HIP ARTHROPLASTY Right 02/29/2012   TOTAL HIP ARTHROPLASTY Right 07/04/2016   Procedure: RIGHT  TOTAL HIP ARTHROPLASTY ANTERIOR APPROACH;  Surgeon: [name], MD;  Location: [facility] OR;  Service: Orthopedics;  Laterality: Right;   TRANSURETHRAL RESECTION OF PROSTATE     Patient Active Problem List   Diagnosis Date Noted   History of seizure 11/12/2023   History of colon resection 11/12/2023   Vitamin D  deficiency 11/12/2023   Impaired gait and mobility 11/04/2023   Drug-induced neutropenia (HCC) 10/29/2023   Foley catheter in place 10/28/2023   Urinary retention 10/28/2023   Orthostatic hypotension 10/15/2023   Status post resection of meningioma 10/10/2023   SDH (subdural hematoma) (HCC) 10/10/2023   Intracranial bleed (HCC) 09/30/2023   Brain mass 09/30/2023   Hyponatremia 09/30/2023   Hypokalemia 09/30/2023   Hypocalcemia 09/30/2023   Wound drainage 09/10/2022   Vitamin B12 deficiency 08/26/2020   Restless legs 07/22/2019   History of skin cancer 01/12/2019   Erectile dysfunction 01/12/2019   Anxiety 01/12/2019   Mild dementia (HCC) 09/27/2017   Chronic constipation 09/27/2017   Osteoarthritis of right hip 06/05/2016   Prostate cancer (HCC) 12/07/2015   Paroxysmal atrial fibrillation (HCC) 12/07/2015   Hypothyroidism 12/07/2015    ONSET DATE: 10/03/23   REFERRING DIAG: Z98.890,Z86.018 (ICD-10-CM) - Status post resection of meningioma I62.9 (ICD-10-CM) - Intracranial bleed (HCC  THERAPY DIAG:  Cognitive communication deficit  Dysarthria and anarthria  Other voice and resonance disorders  Rationale for Evaluation and Treatment: Rehabilitation  SUBJECTIVE:   SUBJECTIVE STATEMENT: I guess memory. (Cues) (Re: cognitive deficits since hospitalization)  Pt accompanied by: significant other  PERTINENT HISTORY: 83 y.o. year old male  who  has a past medical history of Acute encephalopathy (09/30/2023), Arthritis, Atrial fibrillation (HCC), Complication of anesthesia, Dementia (HCC), Depression, Hard of hearing, constipation, Osteoporosis, Prostate cancer (HCC),  Restless legs, Thyroid  disease, Vitiligo, and Volvulus of sigmoid colon s/p flex sig/rectal tube 09/27/2017 (09/27/2017)resection of left frontal brain tumor and for evacuation of SDH on 10/03/2023 with Dr. Colon, with revision and SDH evac 5/9 and subsequent 5/15-5/27/25 [facility] stay  PAIN:  Are you having pain? No  FALLS: Has patient fallen in last 6 months?  See PT evaluation for details  LIVING ENVIRONMENT: Lives with: lives with their spouse Lives in: House/apartment  PLOF:  Level of assistance: Independent with ADLs, Independent with IADLs Employment: Retired  PATIENT GOALS: Improve voice  OBJECTIVE:  Note: Objective measures were completed at Evaluation unless otherwise noted.  DIAGNOSTIC FINDINGS:  Date of Discharge from SLP service:Oct 22, 2023 Clinical Impression/Discharge Summary:  Good progress noted overall this stay, as evidenced by mastery of 5/5 LTGs as well as improved memory, attention, problem solving, and processing. Additionally, EMST introduced and pt demonstrates improving vocal intensity. Mild cognitive deficits present at baseline, however, mild to moderate deficits remain at this time. He would benefit from continued ST upon d/c to target remaining cognitive-linguistic deficits, maximize pt independence, and facilitate return to prev roles/responsibilities.    Care Partner:  Caregiver Able to Provide Assistance: Yes  Type of Caregiver Assistance: Cognitive   Reasons for discharge: Discharged from hospital   COGNITION: Overall cognitive status: Impaired and History of cognitive impairments - at baseline Areas of impairment:  Memory: Impaired: Short term Awareness: Impaired: Intellectual and Comment: TBD; pt did not tell SLP any deficits until SLP suggested his memory impairment Functional deficits: pt is not responsible for completing his therapy exercises (wife prompts him to complete). Pt knows when to take his meds but Freddrick gets meds and administers them  to him when he asks for them. Wife brought EMST device today, thinking SLP may want to see pt perform EMST - pt did not think to bring device.  PERCEPTUAL VOICE ASSESSMENT: Voice quality: low vocal intensity Vocal abuse: none noted today Resonance: normal Respiratory function: thoracic breathing and clavicular breathing  OBJECTIVE VOICE ASSESSMENT: Maximum phonation time for sustained ah: average 9.1 seconds (below WNL) Conversational pitch average: 115 Hz (WNL) Conversational loudness average: 61-78 dB Conversational loudness range: 68 dB (below WNL) S/z ratio: 1.4 (Suggestive of dysfunction >1.0) Pt entered with wife bringing his EMST 75 from CIR  ORAL MOTOR EXAMINATION: Overall status: Impaired: Labial: Left (ROM and Strength) Lingual: Left (ROM and Strength) Bilateral (Coordination) Facial: Left (Symmetry) Comments: pt with minimal lt facial droop  STANDARDIZED ASSESSMENTS: CLQT: will be administered in first 2 sessions  PATIENT REPORTED OUTCOME MEASURES (PROM): Cognitive Function: to be completedin first 2 sessions  TREATMENT DATE:   11/19/23:  SLP had pt perform reps of EMST 75, with set cm H2O at approx 45-50. He blew one rep, exhaled remaining breath, then took a full breath, counted 5 seconds using one-one thousand...two one thousand... technique, then blew his second rep and continued the sequence. Pt stated and wife agreed he blew 25 reps like this. SLP educated pt and wife that SLP wants pt to blow 5 reps with a comfortable full breath without extra breaths or seconds between breaths. SLP instructed pt and wife to do 5 sets of 5 reps as this SLP directed. SLP explained that pt is now progressed beyond all the extraneous counting and extra breaths between reps. Pt and wife demonstrated understanding. SLP also explained SLP role in rehab process,  cognitive linguistic testing with CLQT to ensue in first two sessions.   PATIENT EDUCATION: Education details: see treatment date Person educated: Patient and Spouse Education method: Explanation, Demonstration, and Verbal cues Education comprehension: verbalized understanding, returned demonstration, verbal cues required, and needs further education   GOALS: Goals reviewed with patient? Yes  SHORT TERM GOALS: Target date: 01/10/24  Pt will complete CLQT in first two sessions with goals added PRN Baseline: Goal status: INITIAL  2.  Pt will tell SLP three memory strategies he could use, in two sessions Baseline:  Goal status: INITIAL  3.  Pt will demo 25 reps EMST at 55cm H20 with effort level reported 7/10 in two sessions Baseline:  Goal status: INITIAL  4.  Pt will demo abdominal breathing 80% at rest in two sessions Baseline:  Goal status: INITIAL  5.  Pt will demo voice exercises with occasional min A in 3 sessions Baseline:  Goal status: INITIAL  6.  TBD Baseline:  Goal status: INITIAL  LONG TERM GOALS: Target date: 02/07/24  Pt will improve PROM compared to initial administration Baseline:  Goal status: INITIAL  2.  Pt will use 1 trained memory strategy in or between three sessions after 01/10/24 Baseline:  Goal status: INITIAL  3.  Pt will demo 25 reps EMST at above 55cm H20 with effort level reported 7/10 in two sessions Baseline:  Goal status: INITIAL  4.  Pt will demo abdominal breathing 80% in 5 minutes simple conversation in two sessions Baseline:  Goal status: INITIAL  5.  Pt will demo voice loudness in 5 minutes simple conversation at average 70dB with rare min A for loudness in 3 sessions Baseline:  Goal status: INITIAL   ASSESSMENT:  CLINICAL IMPRESSION: Patient is a 83 y.o. M who was seen today for dysarthria/voice evaluation in light of tumor resection and subsequent SDH May 8-9, 2025. Today he demonstrated thoracic and clavicular  breathing and dysarthria c/b suboptimal speech volume at average 68dB. Weakened lingual and labial musculature which did not hinder pt's intelligibility was also observed today. Pt will also need to have formal cognitive linguistic evaluation in the first few ST sessions and goals will be added based upon results of that eval. Currently, pt's cognitive linguistics deficits are manifested as described above in cognition. Pt may require oral motor HEP for weakened lt labial and lingual musculature, although pt's speech is not characterized by reduced articulatory precision.  OBJECTIVE IMPAIRMENTS: include memory, awareness, dysarthria, and voice disorder. These impairments are limiting patient from managing medications, managing appointments, household responsibilities, ADLs/IADLs, and effectively communicating at home and in community. Factors affecting potential to achieve goals and functional outcome are ability to learn/carryover information and severity of impairments.. Patient  will benefit from skilled SLP services to address above impairments and improve overall function.  REHAB POTENTIAL: Good  PLAN:  SLP FREQUENCY: 2x/week  SLP DURATION: 8 weeks  PLANNED INTERVENTIONS: Environmental controls, Cueing hierachy, Cognitive reorganization, Internal/external aids, Oral motor exercises, Functional tasks, Multimodal communication approach, SLP instruction and feedback, Compensatory strategies, Patient/family education, and [id number] Treatment of speech (30 or 45 min)     Evalina Tabak, CCC-SLP 11/19/2023, 6:02 PM

## 2023-11-21 ENCOUNTER — Ambulatory Visit: Admitting: Occupational Therapy

## 2023-11-21 ENCOUNTER — Telehealth: Payer: Self-pay | Admitting: Internal Medicine

## 2023-11-21 ENCOUNTER — Other Ambulatory Visit: Payer: Self-pay

## 2023-11-21 ENCOUNTER — Ambulatory Visit

## 2023-11-21 ENCOUNTER — Emergency Department (HOSPITAL_BASED_OUTPATIENT_CLINIC_OR_DEPARTMENT_OTHER)
Admission: EM | Admit: 2023-11-21 | Discharge: 2023-11-21 | Disposition: A | Discharge status: Home / Self Care | Attending: Emergency Medicine | Admitting: Emergency Medicine

## 2023-11-21 ENCOUNTER — Encounter (HOSPITAL_BASED_OUTPATIENT_CLINIC_OR_DEPARTMENT_OTHER): Payer: Self-pay | Admitting: Emergency Medicine

## 2023-11-21 DIAGNOSIS — N390 Urinary tract infection, site not specified: Secondary | ICD-10-CM

## 2023-11-21 DIAGNOSIS — F0390 Unspecified dementia without behavioral disturbance: Secondary | ICD-10-CM | POA: Diagnosis not present

## 2023-11-21 DIAGNOSIS — R262 Difficulty in walking, not elsewhere classified: Secondary | ICD-10-CM

## 2023-11-21 DIAGNOSIS — Z85828 Personal history of other malignant neoplasm of skin: Secondary | ICD-10-CM | POA: Diagnosis not present

## 2023-11-21 DIAGNOSIS — Z8546 Personal history of malignant neoplasm of prostate: Secondary | ICD-10-CM | POA: Insufficient documentation

## 2023-11-21 DIAGNOSIS — I69818 Other symptoms and signs involving cognitive functions following other cerebrovascular disease: Secondary | ICD-10-CM

## 2023-11-21 DIAGNOSIS — Z96641 Presence of right artificial hip joint: Secondary | ICD-10-CM | POA: Insufficient documentation

## 2023-11-21 DIAGNOSIS — I959 Hypotension, unspecified: Secondary | ICD-10-CM | POA: Insufficient documentation

## 2023-11-21 DIAGNOSIS — Z87891 Personal history of nicotine dependence: Secondary | ICD-10-CM | POA: Insufficient documentation

## 2023-11-21 DIAGNOSIS — E039 Hypothyroidism, unspecified: Secondary | ICD-10-CM | POA: Insufficient documentation

## 2023-11-21 DIAGNOSIS — Y846 Urinary catheterization as the cause of abnormal reaction of the patient, or of later complication, without mention of misadventure at the time of the procedure: Secondary | ICD-10-CM | POA: Diagnosis not present

## 2023-11-21 DIAGNOSIS — M6281 Muscle weakness (generalized): Secondary | ICD-10-CM | POA: Diagnosis not present

## 2023-11-21 DIAGNOSIS — Z79899 Other long term (current) drug therapy: Secondary | ICD-10-CM | POA: Diagnosis not present

## 2023-11-21 DIAGNOSIS — R2681 Unsteadiness on feet: Secondary | ICD-10-CM

## 2023-11-21 DIAGNOSIS — T83511A Infection and inflammatory reaction due to indwelling urethral catheter, initial encounter: Secondary | ICD-10-CM | POA: Insufficient documentation

## 2023-11-21 DIAGNOSIS — R41840 Attention and concentration deficit: Secondary | ICD-10-CM

## 2023-11-21 DIAGNOSIS — R293 Abnormal posture: Secondary | ICD-10-CM

## 2023-11-21 LAB — URINALYSIS, W/ REFLEX TO CULTURE (INFECTION SUSPECTED)
Bilirubin Urine: NEGATIVE
Glucose, UA: NEGATIVE mg/dL
Ketones, ur: NEGATIVE mg/dL
Nitrite: POSITIVE — AB
Protein, ur: 30 mg/dL — AB
Specific Gravity, Urine: 1.015 (ref 1.005–1.030)
pH: 6 (ref 5.0–8.0)

## 2023-11-21 LAB — CBC WITH DIFFERENTIAL/PLATELET
Abs Immature Granulocytes: 0.05 10*3/uL (ref 0.00–0.07)
Basophils Absolute: 0 10*3/uL (ref 0.0–0.1)
Basophils Relative: 0 %
Eosinophils Absolute: 0.1 10*3/uL (ref 0.0–0.5)
Eosinophils Relative: 1 %
HCT: 31 % — ABNORMAL LOW (ref 39.0–52.0)
Hemoglobin: 10.3 g/dL — ABNORMAL LOW (ref 13.0–17.0)
Immature Granulocytes: 1 %
Lymphocytes Relative: 5 %
Lymphs Abs: 0.5 10*3/uL — ABNORMAL LOW (ref 0.7–4.0)
MCH: 27.8 pg (ref 26.0–34.0)
MCHC: 33.2 g/dL (ref 30.0–36.0)
MCV: 83.6 fL (ref 80.0–100.0)
Monocytes Absolute: 1.4 10*3/uL — ABNORMAL HIGH (ref 0.1–1.0)
Monocytes Relative: 13 %
Neutro Abs: 8.9 10*3/uL — ABNORMAL HIGH (ref 1.7–7.7)
Neutrophils Relative %: 80 %
Platelets: 200 10*3/uL (ref 150–400)
RBC: 3.71 MIL/uL — ABNORMAL LOW (ref 4.22–5.81)
RDW: 13.2 % (ref 11.5–15.5)
WBC: 10.9 10*3/uL — ABNORMAL HIGH (ref 4.0–10.5)
nRBC: 0 % (ref 0.0–0.2)

## 2023-11-21 LAB — BASIC METABOLIC PANEL WITH GFR
Anion gap: 8 (ref 5–15)
BUN: 18 mg/dL (ref 8–23)
CO2: 25 mmol/L (ref 22–32)
Calcium: 9.3 mg/dL (ref 8.9–10.3)
Chloride: 99 mmol/L (ref 98–111)
Creatinine, Ser: 0.96 mg/dL (ref 0.61–1.24)
GFR, Estimated: >60 mL/min (ref >60)
Glucose, Bld: 86 mg/dL (ref 70–99)
Potassium: 4.6 mmol/L (ref 3.5–5.1)
Sodium: 132 mmol/L — ABNORMAL LOW (ref 135–145)

## 2023-11-21 LAB — LACTIC ACID, PLASMA: Lactic Acid, Venous: 1.2 mmol/L (ref 0.5–1.9)

## 2023-11-21 MED ORDER — SODIUM CHLORIDE 0.9 % IV SOLN
1.0000 g | Freq: Once | INTRAVENOUS | Status: AC
  Administered 2023-11-21: 1 g via INTRAVENOUS
  Filled 2023-11-21: qty 10

## 2023-11-21 MED ORDER — CEFPODOXIME PROXETIL 200 MG PO TABS: 200.0000 mg | ORAL_TABLET | Freq: Two times a day (BID) | ORAL | 0 refills | Status: AC

## 2023-11-21 MED ORDER — MIDODRINE HCL 5 MG PO TABS: 10.0000 mg | ORAL_TABLET | Freq: Once | ORAL | Status: DC

## 2023-11-21 MED ORDER — SODIUM CHLORIDE 0.9 % IV BOLUS
1000.0000 mL | Freq: Once | INTRAVENOUS | Status: AC
  Administered 2023-11-21: 1000 mL via INTRAVENOUS

## 2023-11-21 MED ORDER — MIDODRINE HCL 5 MG PO TABS
5.0000 mg | ORAL_TABLET | Freq: Once | ORAL | Status: AC
  Administered 2023-11-21: 5 mg via ORAL
  Filled 2023-11-21: qty 1

## 2023-11-21 NOTE — Telephone Encounter (Signed)
 Pt's spouse came into office with BP readings taken by physical therapist on 11/21/23. Advised by cma, patient needs to go to ED. Advised patient's spouse to take patient to [facility]. Spouse agreed.  Readings are below:   Seated: 95/60, 76 bpm Seated after exercise: 89/58, 78 bpm Standing x 1 min: 73/43, 78 bpm After 10 min ex. Bike: 69/49, 73 bpm.

## 2023-11-21 NOTE — ED Notes (Signed)
 Pt aware of the need for a urine... Unable to currently provide the sample.Marland KitchenMarland Kitchen

## 2023-11-21 NOTE — Patient Instructions (Signed)
 Memory Compensation Strategies  Use "WARM" strategy. W= write it down A=  associate it R=  repeat it M=  make a mental picture  You can keep a Memory Notebook. Use a 3-ring notebook with sections for the following:  calendar, important names and phone numbers, medications, doctors' names/phone numbers, "to do list"/reminders, and a section to journal what you did each day  Use a calendar to write appointments down.  Write yourself a schedule for the day.  This can be placed on the calendar or in a separate section of the Memory Notebook.  Keeping a regular schedule can help memory.  Use medication organizer with sections for each day or morning/evening pills  You may need help loading it  Keep a basket, or pegboard by the door.   Place items that you need to take out with you in the basket or on the pegboard.  You may also want to include a message board for reminders.  Use sticky notes. Place sticky notes with reminders in a place where the task is performed.  For example:  "turn off the stove" placed by the stove, "lock the door" placed on the door at eye level, "take your medications" on the bathroom mirror or by the place where you normally take your medications  Use alarms, timers, and/or a reminder app. Use while cooking to remind yourself to check on food or as a reminder to take your medicine, or as a reminder to make a call, or as a reminder to perform another task, etc.  Use a voice recorder app or small tape recorder to record important information and notes for yourself. Go back at the end of the day and listen to these.  Keeping Thinking Skills Sharp:  1. Jigsaw puzzles  2. Card/board games  3. Talking on the phone/going to social events  4. [employer]  5. Online games  6. Word searches/crossword puzzles  7.  Logic puzzles  8. Aerobic exercise (stationary bike)  9. Eating balanced diet (fruits & veggies)  10. Drink water  11. Try something new--new  recipe, hobby  12. Crafts  13. Do a variety of activities that are challenging  14.  Plan weekly meals and write a grocery list  15. Add cognitive activities to walking/exercising (think of animal/food/city with each letter of the alphabet, counting backwards, thinking of as many vegetables as you can, etc.).--Only do this if safe (no freezing/falls).

## 2023-11-21 NOTE — ED Provider Notes (Signed)
 [place] EMERGENCY DEPARTMENT AT [facility] Provider Note  CSN: [id number] Arrival date & time: 11/21/23 1013  Chief Complaint(s) Hypotension  HPI Jesse [name] is a 83 y.o. male who is here today for some low blood pressures while he was performing physical therapy.  Patient does have a history of orthostatic hypotension, takes midodrine  when he has Low blood pressure readings, but did not take any today.  He is here today with his wife helps provide history.  Patient had craniectomy for large meningioma in May.  Followed up with his neurosurgeon yesterday.   Past Medical History Past Medical History:  Diagnosis Date   Acute encephalopathy 09/30/2023   Arthritis    Atrial fibrillation (HCC)    Complication of anesthesia    Atrial fibrillation during surgery    Dementia (HCC)    Depression    Hard of hearing    Hx of constipation    Osteoporosis    DEXA 11/09/10. Completed treatemtn with Fosamax.   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/rectal tube 09/27/2017 09/27/2017   Patient Active Problem List   Diagnosis Date Noted   History of seizure 11/12/2023   History of colon resection 11/12/2023   Vitamin D  deficiency 11/12/2023   Impaired gait and mobility 11/04/2023   Drug-induced neutropenia (HCC) 10/29/2023   Foley catheter in place 10/28/2023   Urinary retention 10/28/2023   Orthostatic hypotension 10/15/2023   Status post resection of meningioma 10/10/2023   SDH (subdural hematoma) (HCC) 10/10/2023   Intracranial bleed (HCC) 09/30/2023   Brain mass 09/30/2023   Hyponatremia 09/30/2023   Hypokalemia 09/30/2023   Hypocalcemia 09/30/2023   Wound drainage 09/10/2022   Vitamin B12 deficiency 08/26/2020   Restless legs 07/22/2019   History of skin cancer 01/12/2019   Erectile dysfunction 01/12/2019   Anxiety 01/12/2019   Mild dementia (HCC) 09/27/2017   Chronic  constipation 09/27/2017   Osteoarthritis of right hip 06/05/2016   Prostate cancer (HCC) 12/07/2015   Paroxysmal atrial fibrillation (HCC) 12/07/2015   Hypothyroidism 12/07/2015   Home Medication(s) Prior to Admission medications   Medication Sig Start Date End Date Taking? Authorizing Provider  cefpodoxime (VANTIN) 200 MG tablet Take 1 tablet (200 mg total) by mouth 2 (two) times daily for 7 days. 11/21/23 11/28/23 Yes Mannie Pac T, DO  calcium  carbonate (TUMS - DOSED IN MG ELEMENTAL CALCIUM ) 500 MG chewable tablet Chew 4 tablets (800 mg of elemental calcium  total) by mouth daily. 10/18/23   Love, Sharlet RAMAN, PA-C  Cyanocobalamin  (VITAMIN B 12 PO) Take 1,000 Units by mouth 3 (three) times a week.     [provider]  midodrine  (PROAMATINE ) 2.5 MG tablet Take 1 tablet (2.5 mg total) by mouth 2 (two) times daily with a meal. With breakfast and lunch only take if systolic blood pressure under 899 11/12/23   [name] MATSU, MD  senna-docusate (SENOKOT-S) 8.6-50 MG tablet Take 2 tablets by mouth daily with breakfast. 11/12/23   [name] MATSU, MD  sodium chloride  1 g tablet Take 1 tablet (1 g total) by mouth 2 (two) times daily with a meal. 10/22/23   Love, Sharlet RAMAN, PA-C  SYNTHROID  50 MCG tablet TAKE 1 TABLET DAILY BEFORE BREAKFAST 07/18/21   Kennyth Worth HERO, MD  Past Surgical History Past Surgical History:  Procedure Laterality Date   BACK SURGERY     COLONOSCOPY     CRANIOTOMY Left 10/03/2023   Procedure: CRANIOTOMY TUMOR EXCISION LEFT FRONTAL;  Surgeon: [name], MD;  Location: [facility] OR;  Service: Neurosurgery;  Laterality: Left;  Left Frontal Craniotomy for Menigioma   CRANIOTOMY N/A 10/04/2023   Procedure: CRANIOTOMY HEMATOMA EVACUATION SUBDURAL;  Surgeon: [name], MD;  Location: [facility] OR;  Service: Neurosurgery;  Laterality: N/A;  REPEAT   FLEXIBLE  SIGMOIDOSCOPY N/A 09/27/2017   Procedure: FLEXIBLE SIGMOIDOSCOPY;  Surgeon: [name], MD;  Location: [facility] ENDOSCOPY;  Service: Endoscopy;  Laterality: N/A;   JOINT REPLACEMENT     LAMINECTOMY  10/2011   cervical and lumbar laminectomy   PROSTATE BIOPSY  07/10/2011   PROSTATE BIOPSY  09/22/15   TOTAL HIP ARTHROPLASTY Right 02/29/2012   TOTAL HIP ARTHROPLASTY Right 07/04/2016   Procedure: RIGHT TOTAL HIP ARTHROPLASTY ANTERIOR APPROACH;  Surgeon: [name], MD;  Location: [facility] OR;  Service: Orthopedics;  Laterality: Right;   TRANSURETHRAL RESECTION OF PROSTATE     Family History Family History  Problem Relation Age of Onset   Atrial fibrillation Mother    Cystic fibrosis Mother    Atrial fibrillation Sister    Cancer Neg Hx    Esophageal cancer Neg Hx    Colon cancer Neg Hx    Rectal cancer Neg Hx    Stomach cancer Neg Hx     Social History Social History   Tobacco Use   Smoking status: Former    Current packs/day: 0.00    Types: Cigarettes    Quit date: 05/28/1974    Years since quitting: 49.5   Smokeless tobacco: Never   Tobacco comments:    smoked very little   Vaping Use   Vaping status: Never Used  Substance Use Topics   Alcohol use: Yes    Comment: 4-5 standard drinks per week/ stopped drinkin    Drug use: No   Allergies Flomax  [tamsulosin ] and Hm lidocaine  patch [lidocaine ]  Review of Systems Review of Systems  Physical Exam Vital Signs  I have reviewed the triage vital signs BP 127/75   Pulse 67   Temp 98 F (36.7 C) (Oral)   Resp 17   SpO2 100%   Physical Exam Vitals and nursing note reviewed.  Constitutional:      Appearance: He is not toxic-appearing.  HENT:     Head: Normocephalic.   Eyes:     Pupils: Pupils are equal, round, and reactive to light.    Cardiovascular:     Rate and Rhythm: Normal rate.  Pulmonary:     Effort: Pulmonary effort is normal.  Abdominal:     General: Abdomen is flat. There is no distension.     Palpations:  Abdomen is soft.     Tenderness: There is no abdominal tenderness.  Genitourinary:    Comments: Indwelling Foley catheter, clear yellow urine in bag  Musculoskeletal:     Cervical back: Normal range of motion.   Neurological:     Mental Status: He is alert. Mental status is at baseline.     ED Results and Treatments Labs (all labs ordered are listed, but only abnormal results are displayed) Labs Reviewed  BASIC METABOLIC PANEL WITH GFR - Abnormal; Notable for the following components:      Result Value   Sodium 132 (*)    All other components within normal limits  CBC WITH DIFFERENTIAL/PLATELET -  Abnormal; Notable for the following components:   WBC 10.9 (*)    RBC 3.71 (*)    Hemoglobin 10.3 (*)    HCT 31.0 (*)    Neutro Abs 8.9 (*)    Lymphs Abs 0.5 (*)    Monocytes Absolute 1.4 (*)    All other components within normal limits  URINALYSIS, W/ REFLEX TO CULTURE (INFECTION SUSPECTED) - Abnormal; Notable for the following components:   Color, Urine STRAW (*)    Hgb urine dipstick MODERATE (*)    Protein, ur 30 (*)    Nitrite POSITIVE (*)    Leukocytes,Ua LARGE (*)    Bacteria, UA MANY (*)    All other components within normal limits  URINE CULTURE  LACTIC ACID, PLASMA                                                                                                                          Radiology No results found.  Pertinent labs & imaging results that were available during my care of the patient were reviewed by me and considered in my medical decision making (see MDM for details).  Medications Ordered in ED Medications  cefTRIAXone  (ROCEPHIN ) 1 g in sodium chloride  0.9 % 100 mL IVPB (1 g Intravenous New Bag/Given 11/21/23 1259)  sodium chloride  0.9 % bolus 1,000 mL (0 mLs Intravenous Stopped 11/21/23 1242)  midodrine  (PROAMATINE ) tablet 5 mg (5 mg Oral Given 11/21/23 1114)                                                                                                                                      Procedures Procedures  (including critical care time)  Medical Decision Making / ED Course   This patient presents to the ED for concern of periods of hypotension while at physical therapy, this involves an extensive number of treatment options, and is a complaint that carries with it a high risk of complications and morbidity.  The differential diagnosis includes orthostatic hypotension, dehydration, less likely sepsis, less likely arrhythmia.  MDM: Patient overall appears to be at baseline.  He has normal vital signs here in the ED.  I believe the patient likely was experiencing some orthostatic hypotension.  He is prescribed midodrine  for this.  Will check basic labs on the patient, EKG.  Will give him some midodrine  here, some fluids.  Wife states that she has  noticed a change in color in the patient's Foley bag.  Will check urinalysis.  Will order head CT on the patient.  Reassessment 12:50 PM-patient blood pressure has been normal here in the ED.  Blood work overall normal.  Stable anemia.  Renal function normal.  No lactic acidosis.  Patient does have nitrates in his urine.  Probable colonization, however cannot exclude complicated UTI.  Do not believe patient will require admission given his stable vital signs and well appearance.  Will give him a dose of Rocephin  here in the ED.  If head CT is negative, will plan to discharge with oral antibiotics.  Reassessment 1 PM-patient's wife and prefer not to have CT scan done.  She is concerned about radiation.  Explained how this was not a significant risk given the patient's age.  They both preferred to proceed without CT imaging, will follow-up with her neurosurgeon for their planned MRI.  Will discharge.   Additional history obtained: -Additional history obtained from wife at bedside -External records from outside source obtained and reviewed including: Chart review including previous notes, labs,  imaging, consultation notes   Lab Tests: -I ordered, reviewed, and interpreted labs.   The pertinent results include:   Labs Reviewed  BASIC METABOLIC PANEL WITH GFR - Abnormal; Notable for the following components:      Result Value   Sodium 132 (*)    All other components within normal limits  CBC WITH DIFFERENTIAL/PLATELET - Abnormal; Notable for the following components:   WBC 10.9 (*)    RBC 3.71 (*)    Hemoglobin 10.3 (*)    HCT 31.0 (*)    Neutro Abs 8.9 (*)    Lymphs Abs 0.5 (*)    Monocytes Absolute 1.4 (*)    All other components within normal limits  URINALYSIS, W/ REFLEX TO CULTURE (INFECTION SUSPECTED) - Abnormal; Notable for the following components:   Color, Urine STRAW (*)    Hgb urine dipstick MODERATE (*)    Protein, ur 30 (*)    Nitrite POSITIVE (*)    Leukocytes,Ua LARGE (*)    Bacteria, UA MANY (*)    All other components within normal limits  URINE CULTURE  LACTIC ACID, PLASMA      EKG my independent review of the patient's EKG shows no ST segment depressions or elevations, no T wave inversions, no evidence of acute ischemia.  EKG Interpretation Date/Time:  Thursday November 21 2023 11:08:08 EDT Ventricular Rate:  63 PR Interval:    QRS Duration:  94 QT Interval:  427 QTC Calculation: 438 R Axis:   42  Text Interpretation: Junctional rhythm RSR' in V1 or V2, right VCD or RVH Confirmed by Mannie Pac [phone]) on 11/21/2023 1:17:51 PM         Medicines ordered and prescription drug management: Meds ordered this encounter  Medications   sodium chloride  0.9 % bolus 1,000 mL   DISCONTD: midodrine  (PROAMATINE ) tablet 10 mg   midodrine  (PROAMATINE ) tablet 5 mg   cefTRIAXone  (ROCEPHIN ) 1 g in sodium chloride  0.9 % 100 mL IVPB    Antibiotic Indication::   UTI   cefpodoxime (VANTIN) 200 MG tablet    Sig: Take 1 tablet (200 mg total) by mouth 2 (two) times daily for 7 days.    Dispense:  14 tablet    Refill:  0    -I have reviewed the patients  home medicines and have made adjustments as needed   Cardiac Monitoring: The patient was maintained on  a cardiac monitor.  I personally viewed and interpreted the cardiac monitored which showed an underlying rhythm of: Normal sinus rhythm  Social Determinants of Health:  Factors impacting patients care include: Multiple medical comorbidities including recent craniectomy.   Reevaluation: After the interventions noted above, I reevaluated the patient and found that they have :improved  Co morbidities that complicate the patient evaluation  Past Medical History:  Diagnosis Date   Acute encephalopathy 09/30/2023   Arthritis    Atrial fibrillation (HCC)    Complication of anesthesia    Atrial fibrillation during surgery    Dementia (HCC)    Depression    Hard of hearing    Hx of constipation    Osteoporosis    DEXA 11/09/10. Completed treatemtn with Fosamax.   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/rectal tube 09/27/2017 09/27/2017      Dispostion: I considered admission for this patient, however with his reassuring workup he is appropriate for discharge.     Final Clinical Impression(s) / ED Diagnoses Final diagnoses:  Urinary tract infection associated with indwelling urethral catheter, initial encounter [facility])     @PCDICTATION @    Mannie Pac T, DO 11/21/23 1318

## 2023-11-21 NOTE — Therapy (Signed)
 OUTPATIENT PHYSICAL THERAPY NEURO TREATMENT   Patient Name: Jesse [name] MRN: [id number] DOB:1940-10-11, 83 y.o., male Today's Date: 11/21/2023   PCP: [name] MATSU, MD REFERRING PROVIDER: [name] RAMAN, PA-C  END OF SESSION:  PT End of Session - 11/21/23 0851     Visit Number 2    Number of Visits 13    Date for PT Re-Evaluation 12/24/23    Authorization Type Medicare    Progress Note Due on Visit 10    PT Start Time 0845    PT Stop Time 0930    PT Time Calculation (min) 45 min          Past Medical History:  Diagnosis Date   Acute encephalopathy 09/30/2023   Arthritis    Atrial fibrillation (HCC)    Complication of anesthesia    Atrial fibrillation during surgery    Dementia (HCC)    Depression    Hard of hearing    Hx of constipation    Osteoporosis    DEXA 11/09/10. Completed treatemtn with Fosamax.   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/rectal tube 09/27/2017 09/27/2017   Past Surgical History:  Procedure Laterality Date   BACK SURGERY     COLONOSCOPY     CRANIOTOMY Left 10/03/2023   Procedure: CRANIOTOMY TUMOR EXCISION LEFT FRONTAL;  Surgeon: [name], MD;  Location: [facility] OR;  Service: Neurosurgery;  Laterality: Left;  Left Frontal Craniotomy for Menigioma   CRANIOTOMY N/A 10/04/2023   Procedure: CRANIOTOMY HEMATOMA EVACUATION SUBDURAL;  Surgeon: [name], MD;  Location: [facility] OR;  Service: Neurosurgery;  Laterality: N/A;  REPEAT   FLEXIBLE SIGMOIDOSCOPY N/A 09/27/2017   Procedure: FLEXIBLE SIGMOIDOSCOPY;  Surgeon: [name], MD;  Location: [facility] ENDOSCOPY;  Service: Endoscopy;  Laterality: N/A;   JOINT REPLACEMENT     LAMINECTOMY  10/2011   cervical and lumbar laminectomy   PROSTATE BIOPSY  07/10/2011   PROSTATE BIOPSY  09/22/15   TOTAL HIP ARTHROPLASTY Right 02/29/2012   TOTAL HIP ARTHROPLASTY Right 07/04/2016   Procedure: RIGHT TOTAL HIP ARTHROPLASTY  ANTERIOR APPROACH;  Surgeon: [name], MD;  Location: [facility] OR;  Service: Orthopedics;  Laterality: Right;   TRANSURETHRAL RESECTION OF PROSTATE     Patient Active Problem List   Diagnosis Date Noted   History of seizure 11/12/2023   History of colon resection 11/12/2023   Vitamin D  deficiency 11/12/2023   Impaired gait and mobility 11/04/2023   Drug-induced neutropenia (HCC) 10/29/2023   Foley catheter in place 10/28/2023   Urinary retention 10/28/2023   Orthostatic hypotension 10/15/2023   Status post resection of meningioma 10/10/2023   SDH (subdural hematoma) (HCC) 10/10/2023   Intracranial bleed (HCC) 09/30/2023   Brain mass 09/30/2023   Hyponatremia 09/30/2023   Hypokalemia 09/30/2023   Hypocalcemia 09/30/2023   Wound drainage 09/10/2022   Vitamin B12 deficiency 08/26/2020   Restless legs 07/22/2019   History of skin cancer 01/12/2019   Erectile dysfunction 01/12/2019   Anxiety 01/12/2019   Mild dementia (HCC) 09/27/2017   Chronic constipation 09/27/2017   Osteoarthritis of right hip 06/05/2016   Prostate cancer (HCC) 12/07/2015   Paroxysmal atrial fibrillation (HCC) 12/07/2015   Hypothyroidism 12/07/2015    ONSET DATE: 09/30/23  REFERRING DIAG: Z98.890,Z86.018 (ICD-10-CM) - Status post resection of meningioma I62.9 (ICD-10-CM) - Intracranial bleed  THERAPY DIAG:  Other symptoms and signs involving cognitive  functions following other cerebrovascular disease  Unsteadiness on feet  Muscle weakness (generalized)  Abnormal posture  Difficulty in walking, not elsewhere classified  Rationale for Evaluation and Treatment: Rehabilitation  SUBJECTIVE:                                                                                                                                                                                             SUBJECTIVE STATEMENT: Doing ok, feel about the same Pt accompanied by: significant other  PERTINENT HISTORY: 83 y.o. year old  male  who  has a past medical history of Acute encephalopathy (09/30/2023), Arthritis, Atrial fibrillation (HCC), Complication of anesthesia, Dementia (HCC), Depression, Hard of hearing, constipation, Osteoporosis, Prostate cancer (HCC), Restless legs, Thyroid  disease, Vitiligo, and Volvulus of sigmoid colon s/p flex sig/rectal tube 09/27/2017 (09/27/2017)resection of left frontal brain tumor and for evacuation of SDH on 10/03/2023 with Dr. Colon, with revision and SDH evac 5/9 and subsequent 5/15-5/27/25 IPR stay  PAIN:  Are you having pain? No  PRECAUTIONS: Fall and Other: foley catheter  RED FLAGS: None   WEIGHT BEARING RESTRICTIONS: No  FALLS: Has patient fallen in last 6 months? Yes. Number of falls 2  LIVING ENVIRONMENT: Lives with: lives with their spouse Lives in: House/apartment Stairs: two level home Has following equipment at home: Environmental consultant - 2 wheeled  PLOF: Independent with household mobility with device and Independent with community mobility with device  PATIENT GOALS:   OBJECTIVE:   TODAY'S TREATMENT: 11/21/23 Activity Comments  Vitals seated 95/60 mmHg, 76 bpm  Seated LE PRE -ankle pump 30x -hip add iso 30x -LAQ 2x15, 3# -hamstring curls 2x15, green -clamshells 2x15, green  Vitals seated 89/58 mmHg, 78 bpm  Vitals standing x 1 min 73/43 mmHg, 78 bpm  NU-step level 3 x 10 min Steady state for cardiovascular endurance  Vitals seated Provided copy to spouse of readings throughout session    PATIENT EDUCATION: Education details: assessment details, rationale of PT interventions, discussion of goals w/ spouse/pt Person educated: Patient and Spouse Education method: Explanation Education comprehension: verbalized understanding  HOME EXERCISE PROGRAM: TBD     Note: Objective measures were completed at Evaluation unless otherwise noted.  Vitals: 98/67 mmHg, 66 bpm (sitting)  103/69 mmHg, 74 bpm  DIAGNOSTIC FINDINGS:   COGNITION: Overall cognitive  status: History of cognitive impairments - at baseline   SENSATION: Not tested  COORDINATION: Rapid alternating movement WFL Heel to shin limited by hip ROM but good accuracy  EDEMA:  none  MUSCLE TONE: NT  MUSCLE LENGTH: Hamstrings: Right -10 deg; Left -10 deg   DTRs:  NT  POSTURE: rounded shoulders  and forward head  LOWER EXTREMITY ROM:     Active  Right Eval Left Eval  Hip flexion    Hip extension    Hip abduction    Hip adduction    Hip internal rotation    Hip external rotation    Knee flexion    Knee extension -10 -10  Ankle dorsiflexion 10 15  Ankle plantarflexion    Ankle inversion    Ankle eversion     (Blank rows = not tested)  LOWER EXTREMITY MMT:    RLE 4/5  LLE 5/5  BED MOBILITY:  Independent    TRANSFERS: independent   CURB:  Findings: SBA-CGA w/ RW  STAIRS: SBA-CGA w/bilat HR and step-to GAIT: Findings: Distance walked:  , Level of assistance: Supervision, and Comments: decreased step length, decreased right foot clearance, increased double limb support  FUNCTIONAL TESTS:  5 times sit to stand: 26 Timed up and go (TUG): 24 sec w/ RW 10 meter walk test: 20.84 sec Dynamic Gait Index: 14/24                                                                                                                                  TREATMENT DATE:      GOALS: Goals reviewed with patient? Yes  SHORT TERM GOALS: Target date: 12/03/2023    Patient will perform HEP with family/caregiver supervision for improved strength, balance, transfers, and gait  Baseline: Goal status: INITIAL  2.  Demo improved BLE strength and balance per time 20 sec 5xSTS test Baseline: 26 sec Goal status: INITIAL  3.  Modified independent ambulation level surfaces x 500 ft to improve functional independence Baseline: supervision x 375 ft w/ RW Goal status: INITIAL  4.  Curb negotiation w/ modified independence to improve safety in community Baseline:  SBA-CGA w/ RW Goal status: INITIAL    LONG TERM GOALS: Target date: 12/24/2023    Reduce risk for falls per score 19/24 Dynamic Gait Index Baseline: 14/24 Goal status: INITIAL  2.  Reduce risk for falls per time 14 sec TUG test Baseline: 24 sec w/ RW Goal status: INITIAL  3.  Improve gait speed to 2.0 ft/sec to enhance efficiency community ambulation Baseline: 1.5 ft/sec Goal status: INITIAL  4.  Modified independent stair ambulation to enable access to 2nd floor bedroom Baseline: SBA-CGA Goal status: INITIAL    ASSESSMENT:  CLINICAL IMPRESSION: Baseline vital signs rather low. Engaged in seated LE PRE to improve strength, activity tolerance and means for elevating BP.  Demo 15-point drop of diastolic once standing from seated baseline measures.  NU-step for improved cardiovascular endurance and for additional means of assessing BP in response to exertion with BP remaining low and no notable increase to HR for compensation despite activity or standing position.  Provided measures to spouse as well as recommendation of compression stocks and abdominal binder to see if this helps.   OBJECTIVE IMPAIRMENTS: Abnormal gait, decreased  activity tolerance, decreased balance, decreased endurance, decreased knowledge of use of DME, decreased mobility, difficulty walking, decreased ROM, decreased strength, decreased safety awareness, dizziness, and postural dysfunction.   ACTIVITY LIMITATIONS: carrying, lifting, bending, standing, stairs, reach over head, and locomotion level  PARTICIPATION LIMITATIONS: meal prep, cleaning, laundry, interpersonal relationship, shopping, and community activity  PERSONAL FACTORS: Age, Time since onset of injury/illness/exacerbation, and 3+ comorbidities: PMH are also affecting patient's functional outcome.   REHAB POTENTIAL: Good  CLINICAL DECISION MAKING: Evolving/moderate complexity  EVALUATION COMPLEXITY: Moderate  PLAN:  PT FREQUENCY:  1-2x/week  PT DURATION: 6 weeks  PLANNED INTERVENTIONS: 97750- Physical Performance Testing, 97110-Therapeutic exercises, 97530- Therapeutic activity, [id number]- Neuromuscular re-education, 97535- Self Care, [id number]- Manual therapy, [id number]- Gait training, [phone]- Canalith repositioning, and [id number]- Aquatic Therapy  PLAN FOR NEXT SESSION: HEP for strength, balance, stair ambulation   8:52 AM, 11/21/23 M. Kelly Devontaye Ground, PT, DPT Physical Therapist- [place] Office Number: [phone]

## 2023-11-21 NOTE — ED Notes (Signed)
 DC paperwork given and verbally understood.

## 2023-11-21 NOTE — Therapy (Signed)
 OUTPATIENT OCCUPATIONAL THERAPY NEURO  Treatment Note  Patient Name: Jesse [name] MRN: [id number] DOB:January 20, 1941, 83 y.o., male Today's Date: 11/21/2023  PCP: [name] MATSU, MD REFERRING PROVIDER: [name] RAMAN, PA-C  END OF SESSION:  OT End of Session - 11/21/23 9177     Visit Number 2    Number of Visits 13    Date for OT Re-Evaluation 12/27/23    Authorization Type Medicare A&B/ BCBS 2025    OT Start Time 0805    OT Stop Time 0845    OT Time Calculation (min) 40 min           Past Medical History:  Diagnosis Date   Acute encephalopathy 09/30/2023   Arthritis    Atrial fibrillation (HCC)    Complication of anesthesia    Atrial fibrillation during surgery    Dementia (HCC)    Depression    Hard of hearing    Hx of constipation    Osteoporosis    DEXA 11/09/10. Completed treatemtn with Fosamax.   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/rectal tube 09/27/2017 09/27/2017   Past Surgical History:  Procedure Laterality Date   BACK SURGERY     COLONOSCOPY     CRANIOTOMY Left 10/03/2023   Procedure: CRANIOTOMY TUMOR EXCISION LEFT FRONTAL;  Surgeon: [name], MD;  Location: [facility] OR;  Service: Neurosurgery;  Laterality: Left;  Left Frontal Craniotomy for Menigioma   CRANIOTOMY N/A 10/04/2023   Procedure: CRANIOTOMY HEMATOMA EVACUATION SUBDURAL;  Surgeon: [name], MD;  Location: [facility] OR;  Service: Neurosurgery;  Laterality: N/A;  REPEAT   FLEXIBLE SIGMOIDOSCOPY N/A 09/27/2017   Procedure: FLEXIBLE SIGMOIDOSCOPY;  Surgeon: [name], MD;  Location[facility] ENDOSCOPY;  Service: Endoscopy;  Laterality: N/A;   JOINT REPLACEMENT     LAMINECTOMY  10/2011   cervical and lumbar laminectomy   PROSTATE BIOPSY  07/10/2011   PROSTATE BIOPSY  09/22/15   TOTAL HIP ARTHROPLASTY Right 02/29/2012   TOTAL HIP ARTHROPLASTY Right 07/04/2016   Procedure: RIGHT TOTAL HIP ARTHROPLASTY ANTERIOR  APPROACH;  Surgeon: [name], MD;  Location: [facility] OR;  Service: Orthopedics;  Laterality: Right;   TRANSURETHRAL RESECTION OF PROSTATE     Patient Active Problem List   Diagnosis Date Noted   History of seizure 11/12/2023   History of colon resection 11/12/2023   Vitamin D  deficiency 11/12/2023   Impaired gait and mobility 11/04/2023   Drug-induced neutropenia (HCC) 10/29/2023   Foley catheter in place 10/28/2023   Urinary retention 10/28/2023   Orthostatic hypotension 10/15/2023   Status post resection of meningioma 10/10/2023   SDH (subdural hematoma) (HCC) 10/10/2023   Intracranial bleed (HCC) 09/30/2023   Brain mass 09/30/2023   Hyponatremia 09/30/2023   Hypokalemia 09/30/2023   Hypocalcemia 09/30/2023   Wound drainage 09/10/2022   Vitamin B12 deficiency 08/26/2020   Restless legs 07/22/2019   History of skin cancer 01/12/2019   Erectile dysfunction 01/12/2019   Anxiety 01/12/2019   Mild dementia (HCC) 09/27/2017   Chronic constipation 09/27/2017   Osteoarthritis of right hip 06/05/2016   Prostate cancer (HCC) 12/07/2015   Paroxysmal atrial fibrillation (HCC) 12/07/2015   Hypothyroidism 12/07/2015    ONSET DATE: referral date 10/16/23  REFERRING DIAG: Z98.890,Z86.018 (ICD-10-CM) - Status post resection of meningioma I62.9 (ICD-10-CM) - Intracranial bleed  THERAPY DIAG:  Attention and concentration deficit  Other symptoms and signs  involving cognitive functions following other cerebrovascular disease  Unsteadiness on feet  Muscle weakness (generalized)  Rationale for Evaluation and Treatment: Rehabilitation  SUBJECTIVE:   SUBJECTIVE STATEMENT: Pt reports that he saw the surgeon and they are recommending an MRI for treatment planning. Pt accompanied by: self and significant other  PERTINENT HISTORY: history of A fib, RLS, dementia, Prostate CA, meningioma dx 2016 (asymptomatic and had refused excision) who was admitted to [facility] on 09/30/23 with one week  history of progressive weakness with cognitive change and unwitnessed fall. He was found to have large heterogenous and hemorrhagic extra axial mass along left frontal convexity with 1.5 cm rightward midline shift and 7 mm subdural hemorrhage felt to be acute on chronic.He underwent crani for resection of left frontal brain tumor and for evacuation of SDH on 10/03/23. Post op course complicated by mental status changes with decrease in verbal out put and was found to have accumulation of bleed with mass effect. He received 2 units FFP and was taken back to OR on 05/09 for revision craniotomy and evacuation of SDH. Post op decadron  taper and Keppra  for seizure prophylaxis.  PRECAUTIONS: Fall and Other: L crani, Seizure, HOH, hx of dementia, orthostatic  WEIGHT BEARING RESTRICTIONS: No  PAIN:  Are you having pain? No  FALLS: Has patient fallen in last 6 months? Yes. Number of falls 2, 1 fall about a week prior to fall that took him to the hospital  LIVING ENVIRONMENT: Lives with: lives with their spouse Lives in: House/apartment Stairs: Yes: Internal: Two level, 1/2 bath on main level, Able to live on main level with bedroom/bathroom steps; and External: Side entrace has 3 STE Has following equipment at home: Vannie - 2 wheeled, Wheelchair (manual), Shower bench, bed side commode, and transport chair  PLOF: Independent with household mobility with device and Needs assistance with ADLs  PATIENT GOALS: to get stronger  OBJECTIVE:  Note: Objective measures were completed at Evaluation unless otherwise noted.  HAND DOMINANCE: Right  ADLs: Transfers/ambulation related to ADLs: Supervision - CGA with RW UB Dressing: Supervision/setup LB Dressing: Spouse completing due to foley catheter, wearing pullup disposable underwear Toileting: using foley catheter for urine, spouse assists with clothing management and hygiene for BM - spouse reports they have a bowel regimen/schedule  Bathing: Spouse  will assist him with bathing, currently at bed level due to foley catheter.  Prior to meningioma pt would still need assistance with washing LB in shower and washing hair Tub Shower transfers: not completing currently Equipment: Transfer tub bench and bed side commode   MOBILITY STATUS: Needs Assist: Supervision - CGA with RW  POSTURE COMMENTS:  rounded shoulders, forward head, and posterior pelvic tilt   ACTIVITY TOLERANCE: Activity tolerance: diminished  FUNCTIONAL OUTCOME MEASURES: PSFS: 0    UPPER EXTREMITY ROM:    Active ROM Right eval Left eval  Shoulder flexion ~80 ~80  Shoulder abduction    Shoulder adduction    Shoulder extension    Shoulder internal rotation 90% 90%  Shoulder external rotation 85% 85%  Elbow flexion    Elbow extension    Wrist flexion    Wrist extension    Wrist ulnar deviation    Wrist radial deviation    Wrist pronation    Wrist supination    (Blank rows = not tested)  UPPER EXTREMITY MMT:   RUE:3+/5 and LUE: 3-/5  COORDINATION: Finger Nose Finger test: slowed bilaterally, mild dysmetria Box and Blocks:  Right 34 blocks, Left 39 blocks  SENSATION: WFL   COGNITION: Overall cognitive status: History of cognitive impairments - at baseline Memory impairments, decreased STM, impaired problem solving  VISION: Subjective report: wears bifocals, has some intermittent diplopia at baseline Baseline vision: Bifocals  VISION ASSESSMENT: To be further assessed in functional context   OBSERVATIONS: Pt appearing unsure of his abilities and frequently looking to spouse for feedback and/or input. Spouse providing majority of pt history and focusing on history and growth of meningioma.  Pt's spouse also reports providing a lot of assistance with bathing and dressing due to foley catheter.                                                                                                                            TREATMENT DATE:  11/21/23 SLUMS:  completed with pt scoring 19/30, pt with difficulty with recall of objects and details from story.  Pt also benefiting from repetition of instructions as forgetting time for clock draw and amount of money in mental math scenario.  Pt also with decreased recall and/or spatial awareness with placing time on clock.   Dual tasking/attention: pt completing large peg board pattern replication while naming items based on color.  Pt demonstrating good attention to task and ability to recall change of color from pattern to pegs.  Pt utilizing written categories to increase recall, with good ability to alternate attention between cognitive and motor task.  Pt with mod difficulty with recall of items named before so as not to repeat self.   Memory strategies: OT educating on memory strategies and keeping thinking skills sharp.  Providing examples of each memory strategy and suggestions on various activities to aid in keeping thinking skills harp.  Pt provided with handouts (see pt instructions)    11/11/23 Educated on purpose of OT with focus on resumption of ADL participation and skills.       PATIENT EDUCATION: Education details: memory and thinking skills Person educated: Patient Education method: Explanation, Demonstration, Verbal cues, and Handouts Education comprehension: needs further education  HOME EXERCISE PROGRAM: TBD   GOALS: Goals reviewed with patient? Yes  SHORT TERM GOALS: Target date: 12/06/23  Pt and spouse will be independent in coordination and strengthening HEP with use of handouts. Baseline: new to OP OT Goal status: in progress  2.  Pt will verbalize understanding of adaptive techniques, task modifications, and/or potential DME/AE needs to increase ease, safety, and independence w/ ADLs Baseline: total assist LB dressing and bathing Goal status: in progress  3.  Pt will complete LB dressing with min assist with use of AE PRN. Baseline: total assist LB dressing Goal  status: in progress  4.  Pt will complete LB bathing with min assist with use of AE PRN Baseline: total assist LB bathing Goal status: in progress   LONG TERM GOALS: Target date: 12/27/23  Pt will demonstrate improved activity tolerance by completing standing activity for 10 mins to allow return to IADLs (pt would iron and  wash breakfast dishes). Baseline: not engaging in any IADLs at this time Goal status: in progress  2.  Pt will be able to complete LB dressing with supervision/setup with use of AE PRN. Baseline: total assist LB dressing Goal status: in progress  3.  Pt will be able to complete LB bathing with supervision/setup with use of AE PRN. Baseline: total assist LB bathing Goal status: in progress  4.  Pt will demonstrate improved UE functional use for ADLs as evidenced by increasing box/ blocks score by 6 blocks with RUE and 3 blocks with LUE Baseline: R: 34 blocks, L: 39 blocks Goal status: in progress  5.  Patient will report at least two-point increase in average PSFS score or at least three-point increase in a single activity score indicating functionally significant improvement given minimum detectable change. Baseline: 0 Goal status: in progress   ASSESSMENT:  CLINICAL IMPRESSION: Patient is a 83 y.o. male who was seen today for occupational therapy treatment with focus on further assessment of cognition and memory.  Pt with difficulty with recall of information and series of words, however benefiting from use of written list during recall task.  Pt receptive to education on memory strategies and activities to aid in keeping thinking skills sharp.  Pt will continue to benefit from skilled occupational therapy services to address strength and coordination, ROM, pain management, balance, GM/FM control, cognition, safety awareness, introduction of compensatory strategies/AE prn, and implementation of an HEP to improve participation and safety during ADLs and IADLs.     PERFORMANCE DEFICITS: in functional skills including ADLs, IADLs, coordination, ROM, strength, pain, Fine motor control, Gross motor control, body mechanics, endurance, decreased knowledge of use of DME, and UE functional use, cognitive skills including attention, memory, problem solving, safety awareness, and sequencing, and psychosocial skills including coping strategies, environmental adaptation, and routines and behaviors.     PLAN:  OT FREQUENCY: 1-2x/week  OT DURATION: 6 weeks  PLANNED INTERVENTIONS: 97168 OT Re-evaluation, 97535 self care/ADL training, 02889 therapeutic exercise, 97530 therapeutic activity, 97112 neuromuscular re-education, functional mobility training, visual/perceptual remediation/compensation, psychosocial skills training, energy conservation, coping strategies training, patient/family education, and DME and/or AE instructions  RECOMMENDED OTHER SERVICES: Speech therapy  CONSULTED AND AGREED WITH PLAN OF CARE: Patient and family member/caregiver  PLAN FOR NEXT SESSION: review memory strategies, initiate coordination HEP, shoulder ROM HEP, educate on AE/DME for LB bathing and dressing   Kyana Aicher, OTR/L 11/21/2023, 8:22 AM   [facility] at [facility] [address] [place], [place] [address] Phone # [phone] Fax # [phone]

## 2023-11-21 NOTE — ED Triage Notes (Signed)
 Was at PT when felt dizzy and became weak. BP was 69/49. Patient is pale and diaphoretic.  Recent surgery on 5/8 to remove brain tumor. Arrived with foley with cloudy urine present.

## 2023-11-21 NOTE — Discharge Instructions (Addendum)
 I have sent you prescription for an antibiotic called cefpodoxime.  You can take this once in the morning once in the evening for the next 1 week.  Follow-up with your PCP within 1 week.  Continue take all medications as prescribed.  Return to the emergency room for fever, weakness.

## 2023-11-22 ENCOUNTER — Telehealth: Payer: Self-pay

## 2023-11-22 NOTE — Transitions of Care (Post Inpatient/ED Visit) (Signed)
   11/22/2023  Name: Jesse [name] MRN: [id number] DOB: 1940/06/07  Today's TOC FU Call Status:    Attempted to reach the patient regarding the most recent Inpatient/ED visit.  Follow Up Plan: Additional outreach attempts will be made to reach the patient to complete the Transitions of Care (Post Inpatient/ED visit) call.   Koleen Robson, CMA II

## 2023-11-23 ENCOUNTER — Other Ambulatory Visit (HOSPITAL_COMMUNITY): Payer: Self-pay

## 2023-11-23 LAB — URINE CULTURE: Culture: 80000 — AB

## 2023-11-24 ENCOUNTER — Telehealth (HOSPITAL_BASED_OUTPATIENT_CLINIC_OR_DEPARTMENT_OTHER): Payer: Self-pay | Admitting: *Deleted

## 2023-11-24 NOTE — Progress Notes (Signed)
 ED Antimicrobial Stewardship Positive Culture Follow Up   Jesse [name] is an 83 y.o. male who presented to [facility] on 11/21/2023 with a chief complaint of  Chief Complaint  Patient presents with   Hypotension    Recent Results (from the past 720 hours)  Urine Culture     Status: None   Collection Time: 10/28/23  4:24 PM  Result Value Ref Range Status   MICRO NUMBER: [id number]  Final   SPECIMEN QUALITY: Adequate  Final   Sample Source URINE  Final   STATUS: FINAL  Final   Result: No Growth  Final  Urine Culture     Status: Abnormal   Collection Time: 11/21/23 11:18 AM   Specimen: Urine, Random  Result Value Ref Range Status   Specimen Description   Final    URINE, RANDOM Performed at [facility], [address] [place], [place] [address]    Special Requests   Final    NONE Reflexed from [id number] Performed at [facility], [address], [place], [place] [address]    Culture (A)  Final    80,000 COLONIES/mL ESCHERICHIA COLI Confirmed Extended Spectrum Beta-Lactamase Producer (ESBL).  In bloodstream infections from ESBL organisms, carbapenems are preferred over piperacillin/tazobactam. They are shown to have a lower risk of mortality.    Report Status 11/23/2023 FINAL  Final   Organism ID, Bacteria ESCHERICHIA COLI (A)  Final      Susceptibility   Escherichia coli - MIC*    AMPICILLIN >=32 RESISTANT Resistant     CEFAZOLIN  >=64 RESISTANT Resistant     CEFEPIME 16 RESISTANT Resistant     CEFTRIAXONE  >=64 RESISTANT Resistant     CIPROFLOXACIN  >=4 RESISTANT Resistant     GENTAMICIN  >=16 RESISTANT Resistant     IMIPENEM <=0.25 SENSITIVE Sensitive     NITROFURANTOIN <=16 SENSITIVE Sensitive     TRIMETH /SULFA  >=320 RESISTANT Resistant     AMPICILLIN/SULBACTAM >=32 RESISTANT Resistant     PIP/TAZO 8 SENSITIVE Sensitive ug/mL    * 80,000 COLONIES/mL ESCHERICHIA COLI    []  Treated with cefpodoxime , organism resistant to prescribed  antimicrobial []  Patient discharged originally without antimicrobial agent and treatment is now indicated  New antibiotic prescription: Fosfomycin  ED Provider: Glendia Breeding, MD   Dorn Poot 11/24/2023, 12:02 PM Clinical Pharmacist Monday - Friday phone -  [phone] Saturday - Sunday phone - [phone]

## 2023-11-24 NOTE — Telephone Encounter (Signed)
 Post ED Visit - Positive Culture Follow-up: Successful Patient Follow-Up  Culture assessed and recommendations reviewed by:  []  Rankin Dee, Pharm.D. []  Venetia Gully, Pharm.D., BCPS AQ-ID []  Garrel Crews, Pharm.D., BCPS []  Almarie Lunger, [address].D., BCPS []  [place], [address].D., BCPS, AAHIVP []  Rosaline Bihari, Pharm.D., BCPS, AAHIVP []  Vernell Meier, PharmD, BCPS []  Latanya Hint, PharmD, BCPS []  Donald Medley, PharmD, BCPS [x]  Dorn Poot, PharmD  Positive urine culture  []  Patient discharged without antimicrobial prescription and treatment is now indicated [x]  Organism is resistant to prescribed ED discharge antimicrobial []  Patient with positive blood cultures  Changes discussed with ED provider: Glendia Breeding New antibiotic prescription Fosfomycin 3g x 1  Spoke to pts wife regarding changes to antibiotic. Wife very concerned stating the dose of Fosfomycin is so big and her husbands system is weak. She is asking to speak to pharmacist or MD about this. I explained to her that this is the standard dose normally given but told her I would reach out to pharmacist above to see if he could speak to her about this.  She stated I could call in Rx to [facility] but she wants to speak to someone.   Called to [facility], [address]. [place], [place]  Contacted patient, date 11/24/23, time 1245   Jesse [name] 11/24/2023, 12:48 PM

## 2023-11-25 ENCOUNTER — Telehealth: Payer: Self-pay | Admitting: Internal Medicine

## 2023-11-25 MED ORDER — SODIUM CHLORIDE 1 G PO TABS: 1.0000 g | ORAL_TABLET | Freq: Two times a day (BID) | ORAL | 3 refills | Status: AC

## 2023-11-25 NOTE — Telephone Encounter (Signed)
 Prescription Request  11/25/2023  LOV: 11/12/2023  What is the name of the medication or equipment? sodium chloride  1 g tablet takes 2 per day with refills  Have you contacted your pharmacy to request a refill? Yes   Which pharmacy would you like this sent to?    [facility] [address] [place] - [address] [place] [address] Phone: [phone] Fax: [phone]    Patient notified that their request is being sent to the clinical staff for review and that they should receive a response within 2 business days.   Please advise at Mobile [phone] (mobile)

## 2023-11-25 NOTE — Telephone Encounter (Signed)
 Copied from CRM [id number]. Topic: Clinical - Medication Refill >> Nov 25, 2023  9:07 AM Aleatha C wrote: Medication: sodium chloride  1 g tablet  Has the patient contacted their pharmacy? No (Agent: If no, request that the patient contact the pharmacy for the refill. If patient does not wish to contact the pharmacy document the reason why and proceed with request.) (Agent: If yes, when and what did the pharmacy advise?)  This is the patient's preferred pharmacy:    [facility] [address] [place] - [address] [place] [address] Phone: [phone] Fax: [phone]    Is this the correct pharmacy for this prescription? Yes If no, delete pharmacy and type the correct one.   Has the prescription been filled recently? No  Is the patient out of the medication? No  Has the patient been seen for an appointment in the last year OR does the patient have an upcoming appointment? Yes  Can we respond through MyChart? No  Agent: Please be advised that Rx refills may take up to 3 business days. We ask that you follow-up with your pharmacy.

## 2023-11-26 ENCOUNTER — Telehealth: Payer: Self-pay

## 2023-11-26 NOTE — Telephone Encounter (Signed)
 Pt calling to check on status of this prescription

## 2023-11-26 NOTE — Telephone Encounter (Signed)
 Spoke with pt via phone told him that his sodium medication was sent in yesterday to [facility] local [facility] they requested.

## 2023-11-26 NOTE — Telephone Encounter (Signed)
 Spoke with pt about medication was sent yesterday to [facility] per pt request

## 2023-11-27 ENCOUNTER — Ambulatory Visit: Attending: Physical Medicine and Rehabilitation | Admitting: Occupational Therapy

## 2023-11-27 ENCOUNTER — Ambulatory Visit: Admitting: Physical Therapy

## 2023-11-27 ENCOUNTER — Encounter: Payer: Self-pay | Admitting: Physical Therapy

## 2023-11-27 VITALS — BP 101/63

## 2023-11-27 DIAGNOSIS — R2681 Unsteadiness on feet: Secondary | ICD-10-CM | POA: Insufficient documentation

## 2023-11-27 DIAGNOSIS — R262 Difficulty in walking, not elsewhere classified: Secondary | ICD-10-CM | POA: Diagnosis present

## 2023-11-27 DIAGNOSIS — I69818 Other symptoms and signs involving cognitive functions following other cerebrovascular disease: Secondary | ICD-10-CM | POA: Diagnosis present

## 2023-11-27 DIAGNOSIS — M6281 Muscle weakness (generalized): Secondary | ICD-10-CM | POA: Diagnosis present

## 2023-11-27 DIAGNOSIS — R41840 Attention and concentration deficit: Secondary | ICD-10-CM | POA: Insufficient documentation

## 2023-11-27 DIAGNOSIS — R293 Abnormal posture: Secondary | ICD-10-CM | POA: Diagnosis present

## 2023-11-27 NOTE — Therapy (Signed)
 OUTPATIENT PHYSICAL THERAPY NEURO TREATMENT   Patient Name: Jesse [name] MRN: [id number] DOB:06-18-1940, 83 y.o., male Today's Date: 11/27/2023   PCP: [name] MATSU, MD REFERRING PROVIDER: [name] RAMAN, PA-C  END OF SESSION:  PT End of Session - 11/27/23 0939     Visit Number 3    Number of Visits 13    Date for PT Re-Evaluation 12/24/23    Authorization Type Medicare    Progress Note Due on Visit 10    PT Start Time 0936    PT Stop Time 1016    PT Time Calculation (min) 40 min    Activity Tolerance Patient tolerated treatment well    Behavior During Therapy [facility] for tasks assessed/performed           Past Medical History:  Diagnosis Date   Acute encephalopathy 09/30/2023   Arthritis    Atrial fibrillation (HCC)    Complication of anesthesia    Atrial fibrillation during surgery    Dementia (HCC)    Depression    Hard of hearing    Hx of constipation    Osteoporosis    DEXA 11/09/10. Completed treatemtn with Fosamax.   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/rectal tube 09/27/2017 09/27/2017   Past Surgical History:  Procedure Laterality Date   BACK SURGERY     COLONOSCOPY     CRANIOTOMY Left 10/03/2023   Procedure: CRANIOTOMY TUMOR EXCISION LEFT FRONTAL;  Surgeon: [name], MD;  Location: [facility] OR;  Service: Neurosurgery;  Laterality: Left;  Left Frontal Craniotomy for Menigioma   CRANIOTOMY N/A 10/04/2023   Procedure: CRANIOTOMY HEMATOMA EVACUATION SUBDURAL;  Surgeon: [name], MD;  Location: [facility] OR;  Service: Neurosurgery;  Laterality: N/A;  REPEAT   FLEXIBLE SIGMOIDOSCOPY N/A 09/27/2017   Procedure: FLEXIBLE SIGMOIDOSCOPY;  Surgeon: [name], MD;  Location: [facility] ENDOSCOPY;  Service: Endoscopy;  Laterality: N/A;   JOINT REPLACEMENT     LAMINECTOMY  10/2011   cervical and lumbar laminectomy   PROSTATE BIOPSY  07/10/2011   PROSTATE BIOPSY  09/22/15   TOTAL HIP  ARTHROPLASTY Right 02/29/2012   TOTAL HIP ARTHROPLASTY Right 07/04/2016   Procedure: RIGHT TOTAL HIP ARTHROPLASTY ANTERIOR APPROACH;  Surgeon: [name], MD;  Location: [facility] OR;  Service: Orthopedics;  Laterality: Right;   TRANSURETHRAL RESECTION OF PROSTATE     Patient Active Problem List   Diagnosis Date Noted   History of seizure 11/12/2023   History of colon resection 11/12/2023   Vitamin D  deficiency 11/12/2023   Impaired gait and mobility 11/04/2023   Drug-induced neutropenia (HCC) 10/29/2023   Foley catheter in place 10/28/2023   Urinary retention 10/28/2023   Orthostatic hypotension 10/15/2023   Status post resection of meningioma 10/10/2023   SDH (subdural hematoma) (HCC) 10/10/2023   Intracranial bleed (HCC) 09/30/2023   Brain mass 09/30/2023   Hyponatremia 09/30/2023   Hypokalemia 09/30/2023   Hypocalcemia 09/30/2023   Wound drainage 09/10/2022   Vitamin B12 deficiency 08/26/2020   Restless legs 07/22/2019   History of skin cancer 01/12/2019   Erectile dysfunction 01/12/2019   Anxiety 01/12/2019   Mild dementia (HCC) 09/27/2017   Chronic constipation 09/27/2017   Osteoarthritis of right hip 06/05/2016   Prostate cancer (HCC) 12/07/2015   Paroxysmal atrial fibrillation (HCC) 12/07/2015   Hypothyroidism 12/07/2015    ONSET DATE: 09/30/23  REFERRING DIAG: Z98.890,Z86.018 (ICD-10-CM) -  Status post resection of meningioma I62.9 (ICD-10-CM) - Intracranial bleed  THERAPY DIAG:  Unsteadiness on feet  Muscle weakness (generalized)  Rationale for Evaluation and Treatment: Rehabilitation  SUBJECTIVE:                                                                                                                                                                                             SUBJECTIVE STATEMENT: No pain, no changes since last visit; have had several times of unsteadiness when I move fast Pt accompanied by: significant other  PERTINENT HISTORY: 83 y.o.  year old male  who  has a past medical history of Acute encephalopathy (09/30/2023), Arthritis, Atrial fibrillation (HCC), Complication of anesthesia, Dementia (HCC), Depression, Hard of hearing, constipation, Osteoporosis, Prostate cancer (HCC), Restless legs, Thyroid  disease, Vitiligo, and Volvulus of sigmoid colon s/p flex sig/rectal tube 09/27/2017 (09/27/2017)resection of left frontal brain tumor and for evacuation of SDH on 10/03/2023 with Dr. Colon, with revision and SDH evac 5/9 and subsequent 5/15-5/27/25 IPR stay  PAIN:  Are you having pain? No  PRECAUTIONS: Fall and Other: foley catheter  RED FLAGS: None   WEIGHT BEARING RESTRICTIONS: No  FALLS: Has patient fallen in last 6 months? Yes. Number of falls 2  LIVING ENVIRONMENT: Lives with: lives with their spouse Lives in: House/apartment Stairs: two level home Has following equipment at home: Vannie - 2 wheeled  PLOF: Independent with household mobility with device and Independent with community mobility with device  PATIENT GOALS:   OBJECTIVE:    TODAY'S TREATMENT: 11/27/2023 Activity Comments  Vitals HR 69 bpm, O2 sats 97% (BP was assessed in OT session previous to PT and was [facility])  Seated BLE there ex: Ankle pumps Seated march LAQ Hip adduction ball squeezes Resisted hip abduction -green band 2 x 15  Vitals HR 69, O2 sats 97%  Sit to stand, 2 x 5 reps   Standing step taps to 6 block, 2 x 10 reps, BUE support at walker   Gait 50 ft x 3 reps with RW supervision        PATIENT EDUCATION: Education details: HEP initiated Person educated: Patient and Spouse Education method: Programmer, multimedia, Manufacturing engineer, handouts Education comprehension: verbalized, demo understanding  HOME EXERCISE PROGRAM: Access Code: [email or web address] URL: [email or web address] Date: 11/27/2023 Prepared by: [facility] - [facility]  Exercises - Seated Hip Abduction with Resistance  - 1 x daily - 5 x weekly - 2 sets  - 15 reps - Seated March  - 1 x daily - 5 x weekly - 2 sets - 15 reps - Seated Heel Toe Raises  - 1  x daily - 5 x weekly - 2 sets - 15 reps - Seated Hamstring Curl with Anchored Resistance  - 1 x daily - 5 x weekly - 2 sets - 15 reps   --------------------------------------------------------  Note: Objective measures were completed at Evaluation unless otherwise noted.  Vitals: 98/67 mmHg, 66 bpm (sitting)  103/69 mmHg, 74 bpm  DIAGNOSTIC FINDINGS:   COGNITION: Overall cognitive status: History of cognitive impairments - at baseline   SENSATION: Not tested  COORDINATION: Rapid alternating movement WFL Heel to shin limited by hip ROM but good accuracy  EDEMA:  none  MUSCLE TONE: NT  MUSCLE LENGTH: Hamstrings: Right -10 deg; Left -10 deg   DTRs:  NT  POSTURE: rounded shoulders and forward head  LOWER EXTREMITY ROM:     Active  Right Eval Left Eval  Hip flexion    Hip extension    Hip abduction    Hip adduction    Hip internal rotation    Hip external rotation    Knee flexion    Knee extension -10 -10  Ankle dorsiflexion 10 15  Ankle plantarflexion    Ankle inversion    Ankle eversion     (Blank rows = not tested)  LOWER EXTREMITY MMT:    RLE 4/5  LLE 5/5  BED MOBILITY:  Independent    TRANSFERS: independent   CURB:  Findings: SBA-CGA w/ RW  STAIRS: SBA-CGA w/bilat HR and step-to GAIT: Findings: Distance walked:  , Level of assistance: Supervision, and Comments: decreased step length, decreased right foot clearance, increased double limb support  FUNCTIONAL TESTS:  5 times sit to stand: 26 Timed up and go (TUG): 24 sec w/ RW 10 meter walk test: 20.84 sec Dynamic Gait Index: 14/24                                                                                                                                  TREATMENT DATE:      GOALS: Goals reviewed with patient? Yes  SHORT TERM GOALS: Target date: 12/03/2023    Patient will  perform HEP with family/caregiver supervision for improved strength, balance, transfers, and gait  Baseline: Goal status: IN PROGRESS  2.  Demo improved BLE strength and balance per time 20 sec 5xSTS test Baseline: 26 sec Goal status: IN PROGRESS  3.  Modified independent ambulation level surfaces x 500 ft to improve functional independence Baseline: supervision x 375 ft w/ RW Goal status: IN PROGRESS  4.  Curb negotiation w/ modified independence to improve safety in community Baseline: SBA-CGA w/ RW Goal status: IN PROGRESS    LONG TERM GOALS: Target date: 12/24/2023    Reduce risk for falls per score 19/24 Dynamic Gait Index Baseline: 14/24 Goal status: IN PROGRESS  2.  Reduce risk for falls per time 14 sec TUG test Baseline: 24 sec w/ RW Goal status: IN PROGRESS  3.  Improve gait speed to 2.0 ft/sec  to enhance efficiency community ambulation Baseline: 1.5 ft/sec Goal status: IN PROGRESS  4.  Modified independent stair ambulation to enable access to 2nd floor bedroom Baseline: SBA-CGA Goal status: IN PROGRESS    ASSESSMENT:  CLINICAL IMPRESSION: Pt presents today with no new complaints.  BP measures were WFL in OT session just prior to PT, and pt has no c/o dizziness or lightheadedness, so not assessed during PT session today. Skilled PT session focused on lower extremity strengthening and initiating HEP; wife is present for education in HEP. Pt needs brief seated rest breaks between sets, and O2 sats/HR remain WFL throughout. Pt will continue to benefit from skilled PT towards goals for improved functional mobility and decreased fall risk.   OBJECTIVE IMPAIRMENTS: Abnormal gait, decreased activity tolerance, decreased balance, decreased endurance, decreased knowledge of use of DME, decreased mobility, difficulty walking, decreased ROM, decreased strength, decreased safety awareness, dizziness, and postural dysfunction.   ACTIVITY LIMITATIONS: carrying, lifting,  bending, standing, stairs, reach over head, and locomotion level  PARTICIPATION LIMITATIONS: meal prep, cleaning, laundry, interpersonal relationship, shopping, and community activity  PERSONAL FACTORS: Age, Time since onset of injury/illness/exacerbation, and 3+ comorbidities: PMH are also affecting patient's functional outcome.   REHAB POTENTIAL: Good  CLINICAL DECISION MAKING: Evolving/moderate complexity  EVALUATION COMPLEXITY: Moderate  PLAN:  PT FREQUENCY: 1-2x/week  PT DURATION: 6 weeks  PLANNED INTERVENTIONS: 97750- Physical Performance Testing, 97110-Therapeutic exercises, 97530- Therapeutic activity, 97112- Neuromuscular re-education, 97535- Self Care, [id number]- Manual therapy, [phone]- Gait training, [phone]- Canalith repositioning, and [phone]- Aquatic Therapy  PLAN FOR NEXT SESSION: Review HEP for strength, balance, stair ambulation   Greig Anon, PT 11/27/23 11:49 AM Phone: [phone] Fax: [phone]  [facility] at [facility] [address], [address] [place], [place] [address] Phone # [phone] Fax # [phone]

## 2023-11-27 NOTE — Therapy (Signed)
 OUTPATIENT OCCUPATIONAL THERAPY NEURO  Treatment Note  Patient Name: Jesse [name] MRN: [id number] DOB:24-Nov-1940, 83 y.o., male Today's Date: 11/27/2023  PCP: [name] MATSU, MD REFERRING PROVIDER: [name] RAMAN, PA-C  END OF SESSION:  OT End of Session - 11/27/23 1212     Visit Number 3    Number of Visits 13    Date for OT Re-Evaluation 12/27/23    Authorization Type Medicare A&B/ BCBS 2025    OT Start Time 0846    OT Stop Time 0928    OT Time Calculation (min) 42 min    Activity Tolerance Patient tolerated treatment well    Behavior During Therapy [facility] for tasks assessed/performed           Past Medical History:  Diagnosis Date   Acute encephalopathy 09/30/2023   Arthritis    Atrial fibrillation (HCC)    Complication of anesthesia    Atrial fibrillation during surgery    Dementia (HCC)    Depression    Hard of hearing    Hx of constipation    Osteoporosis    DEXA 11/09/10. Completed treatemtn with Fosamax.   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/rectal tube 09/27/2017 09/27/2017   Past Surgical History:  Procedure Laterality Date   BACK SURGERY     COLONOSCOPY     CRANIOTOMY Left 10/03/2023   Procedure: CRANIOTOMY TUMOR EXCISION LEFT FRONTAL;  Surgeon: [name], MD;  Location: [facility] OR;  Service: Neurosurgery;  Laterality: Left;  Left Frontal Craniotomy for Menigioma   CRANIOTOMY N/A 10/04/2023   Procedure: CRANIOTOMY HEMATOMA EVACUATION SUBDURAL;  Surgeon: [name], MD;  Location: [facility] OR;  Service: Neurosurgery;  Laterality: N/A;  REPEAT   FLEXIBLE SIGMOIDOSCOPY N/A 09/27/2017   Procedure: FLEXIBLE SIGMOIDOSCOPY;  Surgeon: [name], MD;  Location[facility] ENDOSCOPY;  Service: Endoscopy;  Laterality: N/A;   JOINT REPLACEMENT     LAMINECTOMY  10/2011   cervical and lumbar laminectomy   PROSTATE BIOPSY  07/10/2011   PROSTATE BIOPSY  09/22/15   TOTAL HIP ARTHROPLASTY  Right 02/29/2012   TOTAL HIP ARTHROPLASTY Right 07/04/2016   Procedure: RIGHT TOTAL HIP ARTHROPLASTY ANTERIOR APPROACH;  Surgeon: [name], MD;  Location: [facility] OR;  Service: Orthopedics;  Laterality: Right;   TRANSURETHRAL RESECTION OF PROSTATE     Patient Active Problem List   Diagnosis Date Noted   History of seizure 11/12/2023   History of colon resection 11/12/2023   Vitamin D  deficiency 11/12/2023   Impaired gait and mobility 11/04/2023   Drug-induced neutropenia (HCC) 10/29/2023   Foley catheter in place 10/28/2023   Urinary retention 10/28/2023   Orthostatic hypotension 10/15/2023   Status post resection of meningioma 10/10/2023   SDH (subdural hematoma) (HCC) 10/10/2023   Intracranial bleed (HCC) 09/30/2023   Brain mass 09/30/2023   Hyponatremia 09/30/2023   Hypokalemia 09/30/2023   Hypocalcemia 09/30/2023   Wound drainage 09/10/2022   Vitamin B12 deficiency 08/26/2020   Restless legs 07/22/2019   History of skin cancer 01/12/2019   Erectile dysfunction 01/12/2019   Anxiety 01/12/2019   Mild dementia (HCC) 09/27/2017   Chronic constipation 09/27/2017   Osteoarthritis of right hip 06/05/2016   Prostate cancer (HCC) 12/07/2015   Paroxysmal atrial fibrillation (HCC) 12/07/2015   Hypothyroidism 12/07/2015    ONSET DATE: referral date 10/16/23  REFERRING DIAG: Z98.890,Z86.018 (ICD-10-CM) - Status post resection of  meningioma I62.9 (ICD-10-CM) - Intracranial bleed  THERAPY DIAG:  Other symptoms and signs involving cognitive functions following other cerebrovascular disease  Abnormal posture  Muscle weakness (generalized)  Rationale for Evaluation and Treatment: Rehabilitation  SUBJECTIVE:   SUBJECTIVE STATEMENT: Pt reports that he saw the surgeon and will have an MRI scheduled - TBD the date. Pt mostly quiet throughout session but spouse having many questions regarding BP and answered as able.  Pt accompanied by: self and significant other  PERTINENT  HISTORY: history of A fib, RLS, dementia, Prostate CA, meningioma dx 2016 (asymptomatic and had refused excision) who was [facility] to [facility] on 09/30/23 with one week history of progressive weakness with cognitive change and unwitnessed fall. He was found to have large heterogenous and hemorrhagic extra axial mass along left frontal convexity with 1.5 cm rightward midline shift and 7 mm subdural hemorrhage felt to be acute on chronic.He underwent crani for resection of left frontal brain tumor and for evacuation of SDH on 10/03/23. Post op course complicated by mental status changes with decrease in verbal out put and was found to have accumulation of bleed with mass effect. He received 2 units FFP and was taken back to OR on 05/09 for revision craniotomy and evacuation of SDH. Post op decadron  taper and Keppra  for seizure prophylaxis.  PRECAUTIONS: Fall and Other: L crani, Seizure, HOH, hx of dementia, orthostatic  WEIGHT BEARING RESTRICTIONS: No  PAIN:  Are you having pain? No  FALLS: Has patient fallen in last 6 months? Yes. Number of falls 2, 1 fall about a week prior to fall that took him to the hospital  LIVING ENVIRONMENT: Lives with: lives with their spouse Lives in: House/apartment Stairs: Yes: Internal: Two level, 1/2 bath on main level, Able to live on main level with bedroom/bathroom steps; and External: Side entrace has 3 STE Has following equipment at home: Vannie - 2 wheeled, Wheelchair (manual), Shower bench, bed side commode, and transport chair  PLOF: Independent with household mobility with device and Needs assistance with ADLs  PATIENT GOALS: to get stronger  OBJECTIVE:  Note: Objective measures were completed at Evaluation unless otherwise noted.  HAND DOMINANCE: Right  ADLs: Transfers/ambulation related to ADLs: Supervision - CGA with RW UB Dressing: Supervision/setup LB Dressing: Spouse completing due to foley catheter, wearing pullup disposable underwear Toileting:  using foley catheter for urine, spouse assists with clothing management and hygiene for BM - spouse reports they have a bowel regimen/schedule  Bathing: Spouse will assist him with bathing, currently at bed level due to foley catheter.  Prior to meningioma pt would still need assistance with washing LB in shower and washing hair Tub Shower transfers: not completing currently Equipment: Transfer tub bench and bed side commode   MOBILITY STATUS: Needs Assist: Supervision - CGA with RW  POSTURE COMMENTS:  rounded shoulders, forward head, and posterior pelvic tilt   ACTIVITY TOLERANCE: Activity tolerance: diminished  FUNCTIONAL OUTCOME MEASURES: PSFS: 0    UPPER EXTREMITY ROM:    Active ROM Right eval Left eval  Shoulder flexion ~80 ~80  Shoulder abduction    Shoulder adduction    Shoulder extension    Shoulder internal rotation 90% 90%  Shoulder external rotation 85% 85%  Elbow flexion    Elbow extension    Wrist flexion    Wrist extension    Wrist ulnar deviation    Wrist radial deviation    Wrist pronation    Wrist supination    (Blank rows = not tested)  UPPER EXTREMITY MMT:   RUE:3+/5 and LUE: 3-/5  COORDINATION: Finger Nose Finger test: slowed bilaterally, mild dysmetria Box and Blocks:  Right 34 blocks, Left 39 blocks  SENSATION: WFL   COGNITION: Overall cognitive status: History of cognitive impairments - at baseline Memory impairments, decreased STM, impaired problem solving  VISION: Subjective report: wears bifocals, has some intermittent diplopia at baseline Baseline vision: Bifocals  VISION ASSESSMENT: To be further assessed in functional context   OBSERVATIONS: Pt appearing unsure of his abilities and frequently looking to spouse for feedback and/or input. Spouse providing majority of pt history and focusing on history and growth of meningioma.  Pt's spouse also reports providing a lot of assistance with bathing and dressing due to foley  catheter.                                                                                                                            TREATMENT DATE:  11/27/23 Initial focus of session on patient and family therapeutic discussion and education - wife having many questions regarding activity and blood pressure, wanting to fill in this therapist regarding recent hospitalization on 6/26 for hypotensive episode. Per spouse, pt is going to have an MRI scheduled but has not been scheduled yet. BP measured throughout with the following readings: 93/61 sitting, 101/63 static standing, 116/67 after a short walk, and remained WNL throughout session.  Pt performing standing task with no UE support for 2+ minutes while doing card sorting task to address balance, posture, FMC/GMC and while doing a dual task for talking, alternating attention between table top task, and following commands.   11/21/23 SLUMS: completed with pt scoring 19/30, pt with difficulty with recall of objects and details from story.  Pt also benefiting from repetition of instructions as forgetting time for clock draw and amount of money in mental math scenario.  Pt also with decreased recall and/or spatial awareness with placing time on clock.   Dual tasking/attention: pt completing large peg board pattern replication while naming items based on color.  Pt demonstrating good attention to task and ability to recall change of color from pattern to pegs.  Pt utilizing written categories to increase recall, with good ability to alternate attention between cognitive and motor task.  Pt with mod difficulty with recall of items named before so as not to repeat self.   Memory strategies: OT educating on memory strategies and keeping thinking skills sharp.  Providing examples of each memory strategy and suggestions on various activities to aid in keeping thinking skills harp.  Pt provided with handouts (see pt instructions)    11/11/23 Educated on purpose  of OT with focus on resumption of ADL participation and skills.       PATIENT EDUCATION: Education details: memory and thinking skills Person educated: Patient Education method: Explanation, Demonstration, Verbal cues, and Handouts Education comprehension: needs further education  HOME EXERCISE PROGRAM: TBD   GOALS: Goals reviewed with patient? Yes  SHORT  TERM GOALS: Target date: 12/06/23  Pt and spouse will be independent in coordination and strengthening HEP with use of handouts. Baseline: new to OP OT Goal status: in progress  2.  Pt will verbalize understanding of adaptive techniques, task modifications, and/or potential DME/AE needs to increase ease, safety, and independence w/ ADLs Baseline: total assist LB dressing and bathing Goal status: in progress  3.  Pt will complete LB dressing with min assist with use of AE PRN. Baseline: total assist LB dressing Goal status: in progress  4.  Pt will complete LB bathing with min assist with use of AE PRN Baseline: total assist LB bathing Goal status: in progress   LONG TERM GOALS: Target date: 12/27/23  Pt will demonstrate improved activity tolerance by completing standing activity for 10 mins to allow return to IADLs (pt would iron and wash breakfast dishes). Baseline: not engaging in any IADLs at this time Goal status: in progress  2.  Pt will be able to complete LB dressing with supervision/setup with use of AE PRN. Baseline: total assist LB dressing Goal status: in progress  3.  Pt will be able to complete LB bathing with supervision/setup with use of AE PRN. Baseline: total assist LB bathing Goal status: in progress  4.  Pt will demonstrate improved UE functional use for ADLs as evidenced by increasing box/ blocks score by 6 blocks with RUE and 3 blocks with LUE Baseline: R: 34 blocks, L: 39 blocks Goal status: in progress  5.  Patient will report at least two-point increase in average PSFS score or at least  three-point increase in a single activity score indicating functionally significant improvement given minimum detectable change. Baseline: 0 Goal status: in progress   ASSESSMENT:  CLINICAL IMPRESSION: Patient is a 83 y.o. male who was seen today for occupational therapy treatment with focus on blood pressure assessment during various activities to monitor for hypotensive episodes. Pt with BP within normal limits this date and closely monitored throughout session and activity. Pt will continue to benefit from skilled occupational therapy services to address strength and coordination, ROM, pain management, balance, GM/FM control, cognition, safety awareness, introduction of compensatory strategies/AE prn, and implementation of an HEP to improve participation and safety during ADLs and IADLs.    PERFORMANCE DEFICITS: in functional skills including ADLs, IADLs, coordination, ROM, strength, pain, Fine motor control, Gross motor control, body mechanics, endurance, decreased knowledge of use of DME, and UE functional use, cognitive skills including attention, memory, problem solving, safety awareness, and sequencing, and psychosocial skills including coping strategies, environmental adaptation, and routines and behaviors.     PLAN:  OT FREQUENCY: 1-2x/week  OT DURATION: 6 weeks  PLANNED INTERVENTIONS: 97168 OT Re-evaluation, 97535 self care/ADL training, 02889 therapeutic exercise, 97530 therapeutic activity, 97112 neuromuscular re-education, functional mobility training, visual/perceptual remediation/compensation, psychosocial skills training, energy conservation, coping strategies training, patient/family education, and DME and/or AE instructions  RECOMMENDED OTHER SERVICES: Speech therapy  CONSULTED AND AGREED WITH PLAN OF CARE: Patient and family member/caregiver  PLAN FOR NEXT SESSION: review memory strategies, initiate coordination HEP, shoulder ROM HEP, educate on AE/DME for LB bathing  and dressing   Chiquita JAYSON Hopping, OTR/L 11/27/2023, 12:15 PM   [facility] [address] [place], [place] [address] Phone # [phone] Fax # [phone]

## 2023-11-28 ENCOUNTER — Other Ambulatory Visit (HOSPITAL_COMMUNITY): Payer: Self-pay | Admitting: Neurological Surgery

## 2023-11-28 DIAGNOSIS — D329 Benign neoplasm of meninges, unspecified: Secondary | ICD-10-CM

## 2023-11-28 NOTE — Therapy (Incomplete)
 OUTPATIENT PHYSICAL THERAPY NEURO TREATMENT   Patient Name: Jesse [name] MRN: [id number] DOB:23-Oct-1940, 83 y.o., male Today's Date: 11/28/2023   PCP: [name] MATSU, MD REFERRING PROVIDER: [name] RAMAN, PA-C  END OF SESSION:     Past Medical History:  Diagnosis Date   Acute encephalopathy 09/30/2023   Arthritis    Atrial fibrillation (HCC)    Complication of anesthesia    Atrial fibrillation during surgery    Dementia (HCC)    Depression    Hard of hearing    Hx of constipation    Osteoporosis    DEXA 11/09/10. Completed treatemtn with Fosamax.   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/rectal tube 09/27/2017 09/27/2017   Past Surgical History:  Procedure Laterality Date   BACK SURGERY     COLONOSCOPY     CRANIOTOMY Left 10/03/2023   Procedure: CRANIOTOMY TUMOR EXCISION LEFT FRONTAL;  Surgeon: [name], MD;  Location: [facility] OR;  Service: Neurosurgery;  Laterality: Left;  Left Frontal Craniotomy for Menigioma   CRANIOTOMY N/A 10/04/2023   Procedure: CRANIOTOMY HEMATOMA EVACUATION SUBDURAL;  Surgeon: [name], MD;  Location: [facility] OR;  Service: Neurosurgery;  Laterality: N/A;  REPEAT   FLEXIBLE SIGMOIDOSCOPY N/A 09/27/2017   Procedure: FLEXIBLE SIGMOIDOSCOPY;  Surgeon: [name], MD;  Location: [facility] ENDOSCOPY;  Service: Endoscopy;  Laterality: N/A;   JOINT REPLACEMENT     LAMINECTOMY  10/2011   cervical and lumbar laminectomy   PROSTATE BIOPSY  07/10/2011   PROSTATE BIOPSY  09/22/15   TOTAL HIP ARTHROPLASTY Right 02/29/2012   TOTAL HIP ARTHROPLASTY Right 07/04/2016   Procedure: RIGHT TOTAL HIP ARTHROPLASTY ANTERIOR APPROACH;  Surgeon: [name], MD;  Location: [facility] OR;  Service: Orthopedics;  Laterality: Right;   TRANSURETHRAL RESECTION OF PROSTATE     Patient Active Problem List   Diagnosis Date Noted   History of seizure 11/12/2023   History of colon resection  11/12/2023   Vitamin D  deficiency 11/12/2023   Impaired gait and mobility 11/04/2023   Drug-induced neutropenia (HCC) 10/29/2023   Foley catheter in place 10/28/2023   Urinary retention 10/28/2023   Orthostatic hypotension 10/15/2023   Status post resection of meningioma 10/10/2023   SDH (subdural hematoma) (HCC) 10/10/2023   Intracranial bleed (HCC) 09/30/2023   Brain mass 09/30/2023   Hyponatremia 09/30/2023   Hypokalemia 09/30/2023   Hypocalcemia 09/30/2023   Wound drainage 09/10/2022   Vitamin B12 deficiency 08/26/2020   Restless legs 07/22/2019   History of skin cancer 01/12/2019   Erectile dysfunction 01/12/2019   Anxiety 01/12/2019   Mild dementia (HCC) 09/27/2017   Chronic constipation 09/27/2017   Osteoarthritis of right hip 06/05/2016   Prostate cancer (HCC) 12/07/2015   Paroxysmal atrial fibrillation (HCC) 12/07/2015   Hypothyroidism 12/07/2015    ONSET DATE: 09/30/23  REFERRING DIAG: Z98.890,Z86.018 (ICD-10-CM) - Status post resection of meningioma I62.9 (ICD-10-CM) - Intracranial bleed  THERAPY DIAG:  No diagnosis found.  Rationale for Evaluation and Treatment: Rehabilitation  SUBJECTIVE:  SUBJECTIVE STATEMENT: No pain, no changes since last visit; have had several times of unsteadiness when I move fast Pt accompanied by: significant other  PERTINENT HISTORY: 83 y.o. year old male  who  has a past medical history of Acute encephalopathy (09/30/2023), Arthritis, Atrial fibrillation (HCC), Complication of anesthesia, Dementia (HCC), Depression, Hard of hearing, constipation, Osteoporosis, Prostate cancer (HCC), Restless legs, Thyroid  disease, Vitiligo, and Volvulus of sigmoid colon s/p flex sig/rectal tube 09/27/2017 (09/27/2017)resection of left frontal brain tumor and for  evacuation of SDH on 10/03/2023 with Dr. Colon, with revision and SDH evac 5/9 and subsequent 5/15-5/27/25 IPR stay  PAIN:  Are you having pain? No  PRECAUTIONS: Fall and Other: foley catheter  RED FLAGS: None   WEIGHT BEARING RESTRICTIONS: No  FALLS: Has patient fallen in last 6 months? Yes. Number of falls 2  LIVING ENVIRONMENT: Lives with: lives with their spouse Lives in: House/apartment Stairs: two level home Has following equipment at home: Vannie - 2 wheeled  PLOF: Independent with household mobility with device and Independent with community mobility with device  PATIENT GOALS:   OBJECTIVE:     TODAY'S TREATMENT: 12/02/23 Activity Comments                        TODAY'S TREATMENT: 11/27/2023 Activity Comments  Vitals HR 69 bpm, O2 sats 97% (BP was assessed in OT session previous to PT and was [facility])  Seated BLE there ex: Ankle pumps Seated march LAQ Hip adduction ball squeezes Resisted hip abduction -green band 2 x 15  Vitals HR 69, O2 sats 97%  Sit to stand, 2 x 5 reps   Standing step taps to 6 block, 2 x 10 reps, BUE support at walker   Gait 50 ft x 3 reps with RW supervision        PATIENT EDUCATION: Education details: HEP initiated Person educated: Patient and Spouse Education method: Programmer, multimedia, Manufacturing engineer, handouts Education comprehension: verbalized, demo understanding  HOME EXERCISE PROGRAM: Access Code: [email or web address] URL: [email or web address] Date: 11/27/2023 Prepared by: [facility]patient  Re[facility] - [facility]  Exercises - Seated Hip Abduction with Resistance  - 1 x daily - 5 x weekly - 2 sets - 15 reps - Seated March  - 1 x daily - 5 x weekly - 2 sets - 15 reps - Seated Heel Toe Raises  - 1 x daily - 5 x weekly - 2 sets - 15 reps - Seated Hamstring Curl with Anchored Resistance  - 1 x daily - 5 x weekly - 2 sets - 15 reps   --------------------------------------------------------  Note: Objective measures  were completed at Evaluation unless otherwise noted.  Vitals: 98/67 mmHg, 66 bpm (sitting)  103/69 mmHg, 74 bpm  DIAGNOSTIC FINDINGS:   COGNITION: Overall cognitive status: History of cognitive impairments - at baseline   SENSATION: Not tested  COORDINATION: Rapid alternating movement WFL Heel to shin limited by hip ROM but good accuracy  EDEMA:  none  MUSCLE TONE: NT  MUSCLE LENGTH: Hamstrings: Right -10 deg; Left -10 deg   DTRs:  NT  POSTURE: rounded shoulders and forward head  LOWER EXTREMITY ROM:     Active  Right Eval Left Eval  Hip flexion    Hip extension    Hip abduction    Hip adduction    Hip internal rotation    Hip external rotation    Knee flexion    Knee extension -10 -10  Ankle dorsiflexion 10 15  Ankle plantarflexion    Ankle inversion    Ankle eversion     (Blank rows = not tested)  LOWER EXTREMITY MMT:    RLE 4/5  LLE 5/5  BED MOBILITY:  Independent    TRANSFERS: independent   CURB:  Findings: SBA-CGA w/ RW  STAIRS: SBA-CGA w/bilat HR and step-to GAIT: Findings: Distance walked:  , Level of assistance: Supervision, and Comments: decreased step length, decreased right foot clearance, increased double limb support  FUNCTIONAL TESTS:  5 times sit to stand: 26 Timed up and go (TUG): 24 sec w/ RW 10 meter walk test: 20.84 sec Dynamic Gait Index: 14/24                                                                                                                                  TREATMENT DATE:      GOALS: Goals reviewed with patient? Yes  SHORT TERM GOALS: Target date: 12/03/2023    Patient will perform HEP with family/caregiver supervision for improved strength, balance, transfers, and gait  Baseline: Goal status: IN PROGRESS  2.  Demo improved BLE strength and balance per time 20 sec 5xSTS test Baseline: 26 sec Goal status: IN PROGRESS  3.  Modified independent ambulation level surfaces x 500 ft to improve  functional independence Baseline: supervision x 375 ft w/ RW Goal status: IN PROGRESS  4.  Curb negotiation w/ modified independence to improve safety in community Baseline: SBA-CGA w/ RW Goal status: IN PROGRESS    LONG TERM GOALS: Target date: 12/24/2023    Reduce risk for falls per score 19/24 Dynamic Gait Index Baseline: 14/24 Goal status: IN PROGRESS  2.  Reduce risk for falls per time 14 sec TUG test Baseline: 24 sec w/ RW Goal status: IN PROGRESS  3.  Improve gait speed to 2.0 ft/sec to enhance efficiency community ambulation Baseline: 1.5 ft/sec Goal status: IN PROGRESS  4.  Modified independent stair ambulation to enable access to 2nd floor bedroom Baseline: SBA-CGA Goal status: IN PROGRESS    ASSESSMENT:  CLINICAL IMPRESSION: Pt presents today with no new complaints.  BP measures were WFL in OT session just prior to PT, and pt has no c/o dizziness or lightheadedness, so not assessed during PT session today. Skilled PT session focused on lower extremity strengthening and initiating HEP; wife is present for education in HEP. Pt needs brief seated rest breaks between sets, and O2 sats/HR remain WFL throughout. Pt will continue to benefit from skilled PT towards goals for improved functional mobility and decreased fall risk.   OBJECTIVE IMPAIRMENTS: Abnormal gait, decreased activity tolerance, decreased balance, decreased endurance, decreased knowledge of use of DME, decreased mobility, difficulty walking, decreased ROM, decreased strength, decreased safety awareness, dizziness, and postural dysfunction.   ACTIVITY LIMITATIONS: carrying, lifting, bending, standing, stairs, reach over head, and locomotion level  PARTICIPATION LIMITATIONS: meal prep, cleaning, laundry, interpersonal relationship, shopping, and community activity  PERSONAL FACTORS: Age,  Time since onset of injury/illness/exacerbation, and 3+ comorbidities: PMH are also affecting patient's functional  outcome.   REHAB POTENTIAL: Good  CLINICAL DECISION MAKING: Evolving/moderate complexity  EVALUATION COMPLEXITY: Moderate  PLAN:  PT FREQUENCY: 1-2x/week  PT DURATION: 6 weeks  PLANNED INTERVENTIONS: 97750- Physical Performance Testing, 97110-Therapeutic exercises, 97530- Therapeutic activity, [id number]- Neuromuscular re-education, 97535- Self Care, 02859- Manual therapy, [id number]- Gait training, [phone]- Canalith repositioning, and [id number]- Aquatic Therapy  PLAN FOR NEXT SESSION: Review HEP for strength, balance, stair ambulation

## 2023-12-02 ENCOUNTER — Ambulatory Visit: Admitting: Physical Therapy

## 2023-12-02 ENCOUNTER — Ambulatory Visit: Admitting: Occupational Therapy

## 2023-12-02 ENCOUNTER — Encounter: Payer: Self-pay | Admitting: Physical Therapy

## 2023-12-02 DIAGNOSIS — M6281 Muscle weakness (generalized): Secondary | ICD-10-CM

## 2023-12-02 DIAGNOSIS — R2681 Unsteadiness on feet: Secondary | ICD-10-CM

## 2023-12-02 DIAGNOSIS — I69818 Other symptoms and signs involving cognitive functions following other cerebrovascular disease: Secondary | ICD-10-CM | POA: Diagnosis not present

## 2023-12-02 DIAGNOSIS — R41840 Attention and concentration deficit: Secondary | ICD-10-CM

## 2023-12-02 DIAGNOSIS — R262 Difficulty in walking, not elsewhere classified: Secondary | ICD-10-CM

## 2023-12-02 DIAGNOSIS — R293 Abnormal posture: Secondary | ICD-10-CM

## 2023-12-02 NOTE — Therapy (Signed)
 OUTPATIENT PHYSICAL THERAPY NEURO TREATMENT   Patient Name: Jesse [name] MRN: [id number] DOB:03-09-1941, 83 y.o., male Today's Date: 12/02/2023   PCP: [name] MATSU, MD REFERRING PROVIDER: [name] RAMAN, PA-C  END OF SESSION:  PT End of Session - 12/02/23 1233     Visit Number 4    Number of Visits 13    Date for PT Re-Evaluation 12/24/23    Authorization Type Medicare    Progress Note Due on Visit 10    PT Start Time 1146    PT Stop Time 1230    PT Time Calculation (min) 44 min    Activity Tolerance Patient tolerated treatment well    Behavior During Therapy [facility] for tasks assessed/performed            Past Medical History:  Diagnosis Date   Acute encephalopathy 09/30/2023   Arthritis    Atrial fibrillation (HCC)    Complication of anesthesia    Atrial fibrillation during surgery    Dementia (HCC)    Depression    Hard of hearing    Hx of constipation    Osteoporosis    DEXA 11/09/10. Completed treatemtn with Fosamax.   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/rectal tube 09/27/2017 09/27/2017   Past Surgical History:  Procedure Laterality Date   BACK SURGERY     COLONOSCOPY     CRANIOTOMY Left 10/03/2023   Procedure: CRANIOTOMY TUMOR EXCISION LEFT FRONTAL;  Surgeon: [name], MD;  Location: [facility] OR;  Service: Neurosurgery;  Laterality: Left;  Left Frontal Craniotomy for Menigioma   CRANIOTOMY N/A 10/04/2023   Procedure: CRANIOTOMY HEMATOMA EVACUATION SUBDURAL;  Surgeon: [name], MD;  Location: [facility] OR;  Service: Neurosurgery;  Laterality: N/A;  REPEAT   FLEXIBLE SIGMOIDOSCOPY N/A 09/27/2017   Procedure: FLEXIBLE SIGMOIDOSCOPY;  Surgeon: [name], MD;  Location: [facility] ENDOSCOPY;  Service: Endoscopy;  Laterality: N/A;   JOINT REPLACEMENT     LAMINECTOMY  10/2011   cervical and lumbar laminectomy   PROSTATE BIOPSY  07/10/2011   PROSTATE BIOPSY  09/22/15   TOTAL  HIP ARTHROPLASTY Right 02/29/2012   TOTAL HIP ARTHROPLASTY Right 07/04/2016   Procedure: RIGHT TOTAL HIP ARTHROPLASTY ANTERIOR APPROACH;  Surgeon: [name], MD;  Location: [facility] OR;  Service: Orthopedics;  Laterality: Right;   TRANSURETHRAL RESECTION OF PROSTATE     Patient Active Problem List   Diagnosis Date Noted   History of seizure 11/12/2023   History of colon resection 11/12/2023   Vitamin D  deficiency 11/12/2023   Impaired gait and mobility 11/04/2023   Drug-induced neutropenia (HCC) 10/29/2023   Foley catheter in place 10/28/2023   Urinary retention 10/28/2023   Orthostatic hypotension 10/15/2023   Status post resection of meningioma 10/10/2023   SDH (subdural hematoma) (HCC) 10/10/2023   Intracranial bleed (HCC) 09/30/2023   Brain mass 09/30/2023   Hyponatremia 09/30/2023   Hypokalemia 09/30/2023   Hypocalcemia 09/30/2023   Wound drainage 09/10/2022   Vitamin B12 deficiency 08/26/2020   Restless legs 07/22/2019   History of skin cancer 01/12/2019   Erectile dysfunction 01/12/2019   Anxiety 01/12/2019   Mild dementia (HCC) 09/27/2017   Chronic constipation 09/27/2017   Osteoarthritis of right hip 06/05/2016   Prostate cancer (HCC) 12/07/2015   Paroxysmal atrial fibrillation (HCC) 12/07/2015   Hypothyroidism 12/07/2015    ONSET DATE: 09/30/23  REFERRING DIAG: Z98.890,Z86.018 (ICD-10-CM) -  Status post resection of meningioma I62.9 (ICD-10-CM) - Intracranial bleed  THERAPY DIAG:  Unsteadiness on feet  Muscle weakness (generalized)  Other symptoms and signs involving cognitive functions following other cerebrovascular disease  Abnormal posture  Difficulty in walking, not elsewhere classified  Rationale for Evaluation and Treatment: Rehabilitation  SUBJECTIVE:                                                                                                                                                                                             SUBJECTIVE  STATEMENT: Pt reports I think I feel alright, just weak. Pt accompanied by: self   PERTINENT HISTORY: 83 y.o. year old male  who  has a past medical history of Acute encephalopathy (09/30/2023), Arthritis, Atrial fibrillation (HCC), Complication of anesthesia, Dementia (HCC), Depression, Hard of hearing, constipation, Osteoporosis, Prostate cancer (HCC), Restless legs, Thyroid  disease, Vitiligo, and Volvulus of sigmoid colon s/p flex sig/rectal tube 09/27/2017 (09/27/2017)resection of left frontal brain tumor and for evacuation of SDH on 10/03/2023 with Dr. Colon, with revision and SDH evac 5/9 and subsequent 5/15-5/27/25 IPR stay  PAIN:  Are you having pain? No  PRECAUTIONS: Fall and Other: foley catheter  RED FLAGS: None   WEIGHT BEARING RESTRICTIONS: No  FALLS: Has patient fallen in last 6 months? Yes. Number of falls 2  LIVING ENVIRONMENT: Lives with: lives with their spouse Lives in: House/apartment Stairs: two level home Has following equipment at home: Vannie - 2 wheeled  PLOF: Independent with household mobility with device and Independent with community mobility with device  PATIENT GOALS:   OBJECTIVE:   OT reports that BP at end of their session was 93/63 mmHg.    TODAY'S TREATMENT: 12/02/23 Activity Comments  Nustep L3 x 7 min UEs/LEs  Maintaining 58SPM   sitting green TB clam 2x15 sitting march 2x20 3# sitting heel/toe raises 20x  sitting HS curl with green TB 10x each  LAQ 3# 15x each  Small amplitude movements with marches. Otherwise with good form and effort. Difficulty achieving TKE with LAQ  Vitals  98/63 mmHg, 67bpm   Mini squat 10x At TM rail; flexed trunk   Standing alt backwards steps  At TM rail; flexed trunk and crouched posture. B UE support   Step ups onto 6 step 10x CGA  Vitals upon leaving  106/34mmHg, 65bpm        HOME EXERCISE PROGRAM: Access Code: [email or web address] URL: [email or web address] Date: 11/27/2023 Prepared by: [facility] -  Outpatient  Rehab - [facility]  Exercises - Seated Hip Abduction with Resistance  - 1 x daily - 5 x weekly - 2 sets -  15 reps - Seated March  - 1 x daily - 5 x weekly - 2 sets - 15 reps - Seated Heel Toe Raises  - 1 x daily - 5 x weekly - 2 sets - 15 reps - Seated Hamstring Curl with Anchored Resistance  - 1 x daily - 5 x weekly - 2 sets - 15 reps   --------------------------------------------------------  Note: Objective measures were completed at Evaluation unless otherwise noted.  Vitals: 98/67 mmHg, 66 bpm (sitting)  103/69 mmHg, 74 bpm  DIAGNOSTIC FINDINGS:   COGNITION: Overall cognitive status: History of cognitive impairments - at baseline   SENSATION: Not tested  COORDINATION: Rapid alternating movement WFL Heel to shin limited by hip ROM but good accuracy  EDEMA:  none  MUSCLE TONE: NT  MUSCLE LENGTH: Hamstrings: Right -10 deg; Left -10 deg   DTRs:  NT  POSTURE: rounded shoulders and forward head  LOWER EXTREMITY ROM:     Active  Right Eval Left Eval  Hip flexion    Hip extension    Hip abduction    Hip adduction    Hip internal rotation    Hip external rotation    Knee flexion    Knee extension -10 -10  Ankle dorsiflexion 10 15  Ankle plantarflexion    Ankle inversion    Ankle eversion     (Blank rows = not tested)  LOWER EXTREMITY MMT:    RLE 4/5  LLE 5/5  BED MOBILITY:  Independent    TRANSFERS: independent   CURB:  Findings: SBA-CGA w/ RW  STAIRS: SBA-CGA w/bilat HR and step-to GAIT: Findings: Distance walked:  , Level of assistance: Supervision, and Comments: decreased step length, decreased right foot clearance, increased double limb support  FUNCTIONAL TESTS:  5 times sit to stand: 26 Timed up and go (TUG): 24 sec w/ RW 10 meter walk test: 20.84 sec Dynamic Gait Index: 14/24                                                                                                                                   TREATMENT DATE:      GOALS: Goals reviewed with patient? Yes  SHORT TERM GOALS: Target date: 12/03/2023    Patient will perform HEP with family/caregiver supervision for improved strength, balance, transfers, and gait  Baseline: Goal status: IN PROGRESS  2.  Demo improved BLE strength and balance per time 20 sec 5xSTS test Baseline: 26 sec Goal status: IN PROGRESS  3.  Modified independent ambulation level surfaces x 500 ft to improve functional independence Baseline: supervision x 375 ft w/ RW Goal status: IN PROGRESS  4.  Curb negotiation w/ modified independence to improve safety in community Baseline: SBA-CGA w/ RW Goal status: IN PROGRESS    LONG TERM GOALS: Target date: 12/24/2023    Reduce risk for falls per score 19/24 Dynamic Gait Index Baseline: 14/24 Goal status: IN PROGRESS  2.  Reduce risk for falls per time 14 sec TUG test Baseline: 24 sec w/ RW Goal status: IN PROGRESS  3.  Improve gait speed to 2.0 ft/sec to enhance efficiency community ambulation Baseline: 1.5 ft/sec Goal status: IN PROGRESS  4.  Modified independent stair ambulation to enable access to 2nd floor bedroom Baseline: SBA-CGA Goal status: IN PROGRESS    ASSESSMENT:  CLINICAL IMPRESSION: Patient arrived to session with slightly low BP at start of session after OT; pt without lightheadedness. Progressed HEP activities with addition of ankle weights for additional strengthening and endurance benefits. Patient tolerated these additions well. Standing activities included functional strengthening and balance challenges, with UE support required. BP was maintained throughout appointment today and even increased slightly by end of session. No complaints at end of session.  OBJECTIVE IMPAIRMENTS: Abnormal gait, decreased activity tolerance, decreased balance, decreased endurance, decreased knowledge of use of DME, decreased mobility, difficulty walking, decreased ROM, decreased strength,  decreased safety awareness, dizziness, and postural dysfunction.   ACTIVITY LIMITATIONS: carrying, lifting, bending, standing, stairs, reach over head, and locomotion level  PARTICIPATION LIMITATIONS: meal prep, cleaning, laundry, interpersonal relationship, shopping, and community activity  PERSONAL FACTORS: Age, Time since onset of injury/illness/exacerbation, and 3+ comorbidities: PMH are also affecting patient's functional outcome.   REHAB POTENTIAL: Good  CLINICAL DECISION MAKING: Evolving/moderate complexity  EVALUATION COMPLEXITY: Moderate  PLAN:  PT FREQUENCY: 1-2x/week  PT DURATION: 6 weeks  PLANNED INTERVENTIONS: 97750- Physical Performance Testing, 97110-Therapeutic exercises, 97530- Therapeutic activity, 97112- Neuromuscular re-education, 97535- Self Care, [id number]- Manual therapy, [phone]- Gait training, [phone]- Canalith repositioning, and [phone]- Aquatic Therapy  PLAN FOR NEXT SESSION: consider adding ankle weights to HEP for strength, balance, stair ambulation     Louana Terrilyn Christians, Leonidas, DPT 12/02/23 12:34 PM  [facility] at [facility] [address], [address] [place], [place] [address] Phone # [phone] Fax # [phone]

## 2023-12-02 NOTE — Therapy (Signed)
 OUTPATIENT OCCUPATIONAL THERAPY NEURO  Treatment Note  Patient Name: Jesse [name] MRN: [id number] DOB:05-Oct-1940, 83 y.o., male Today's Date: 12/02/2023  PCP: [name] MATSU, MD REFERRING PROVIDER: [name] RAMAN, PA-C  END OF SESSION:  OT End of Session - 12/02/23 1123     Visit Number 4    Number of Visits 13    Date for OT Re-Evaluation 12/27/23    Authorization Type Medicare A&B/ BCBS 2025    OT Start Time 1105    OT Stop Time 1145    OT Time Calculation (min) 40 min    Activity Tolerance Patient tolerated treatment well    Behavior During Therapy [facility] for tasks assessed/performed            Past Medical History:  Diagnosis Date   Acute encephalopathy 09/30/2023   Arthritis    Atrial fibrillation (HCC)    Complication of anesthesia    Atrial fibrillation during surgery    Dementia (HCC)    Depression    Hard of hearing    Hx of constipation    Osteoporosis    DEXA 11/09/10. Completed treatemtn with Fosamax.   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/rectal tube 09/27/2017 09/27/2017   Past Surgical History:  Procedure Laterality Date   BACK SURGERY     COLONOSCOPY     CRANIOTOMY Left 10/03/2023   Procedure: CRANIOTOMY TUMOR EXCISION LEFT FRONTAL;  Surgeon: [name], MD;  Location: [facility] OR;  Service: Neurosurgery;  Laterality: Left;  Left Frontal Craniotomy for Menigioma   CRANIOTOMY N/A 10/04/2023   Procedure: CRANIOTOMY HEMATOMA EVACUATION SUBDURAL;  Surgeon: [name], MD;  Location: [facility] OR;  Service: Neurosurgery;  Laterality: N/A;  REPEAT   FLEXIBLE SIGMOIDOSCOPY N/A 09/27/2017   Procedure: FLEXIBLE SIGMOIDOSCOPY;  Surgeon: [name], MD;  Location[facility] ENDOSCOPY;  Service: Endoscopy;  Laterality: N/A;   JOINT REPLACEMENT     LAMINECTOMY  10/2011   cervical and lumbar laminectomy   PROSTATE BIOPSY  07/10/2011   PROSTATE BIOPSY  09/22/15   TOTAL HIP  ARTHROPLASTY Right 02/29/2012   TOTAL HIP ARTHROPLASTY Right 07/04/2016   Procedure: RIGHT TOTAL HIP ARTHROPLASTY ANTERIOR APPROACH;  Surgeon: [name], MD;  Location: [facility] OR;  Service: Orthopedics;  Laterality: Right;   TRANSURETHRAL RESECTION OF PROSTATE     Patient Active Problem List   Diagnosis Date Noted   History of seizure 11/12/2023   History of colon resection 11/12/2023   Vitamin D  deficiency 11/12/2023   Impaired gait and mobility 11/04/2023   Drug-induced neutropenia (HCC) 10/29/2023   Foley catheter in place 10/28/2023   Urinary retention 10/28/2023   Orthostatic hypotension 10/15/2023   Status post resection of meningioma 10/10/2023   SDH (subdural hematoma) (HCC) 10/10/2023   Intracranial bleed (HCC) 09/30/2023   Brain mass 09/30/2023   Hyponatremia 09/30/2023   Hypokalemia 09/30/2023   Hypocalcemia 09/30/2023   Wound drainage 09/10/2022   Vitamin B12 deficiency 08/26/2020   Restless legs 07/22/2019   History of skin cancer 01/12/2019   Erectile dysfunction 01/12/2019   Anxiety 01/12/2019   Mild dementia (HCC) 09/27/2017   Chronic constipation 09/27/2017   Osteoarthritis of right hip 06/05/2016   Prostate cancer (HCC) 12/07/2015   Paroxysmal atrial fibrillation (HCC) 12/07/2015   Hypothyroidism 12/07/2015    ONSET DATE: referral date 10/16/23  REFERRING DIAG: Z98.890,Z86.018 (ICD-10-CM) - Status post resection  of meningioma I62.9 (ICD-10-CM) - Intracranial bleed  THERAPY DIAG:  Unsteadiness on feet  Muscle weakness (generalized)  Attention and concentration deficit  Rationale for Evaluation and Treatment: Rehabilitation  SUBJECTIVE:   SUBJECTIVE STATEMENT: Pt reports that his wife has been taking his BP quite regularly.   Pt accompanied by: self and significant other  PERTINENT HISTORY: history of A fib, RLS, dementia, Prostate CA, meningioma dx 2016 (asymptomatic and had refused excision) who was admitted to [facility] on 09/30/23 with one week  history of progressive weakness with cognitive change and unwitnessed fall. He was found to have large heterogenous and hemorrhagic extra axial mass along left frontal convexity with 1.5 cm rightward midline shift and 7 mm subdural hemorrhage felt to be acute on chronic.He underwent crani for resection of left frontal brain tumor and for evacuation of SDH on 10/03/23. Post op course complicated by mental status changes with decrease in verbal out put and was found to have accumulation of bleed with mass effect. He received 2 units FFP and was taken back to OR on 05/09 for revision craniotomy and evacuation of SDH. Post op decadron  taper and Keppra  for seizure prophylaxis.  PRECAUTIONS: Fall and Other: L crani, Seizure, HOH, hx of dementia, orthostatic  WEIGHT BEARING RESTRICTIONS: No  PAIN:  Are you having pain? No  FALLS: Has patient fallen in last 6 months? Yes. Number of falls 2, 1 fall about a week prior to fall that took him to the hospital  LIVING ENVIRONMENT: Lives with: lives with their spouse Lives in: House/apartment Stairs: Yes: Internal: Two level, 1/2 bath on main level, Able to live on main level with bedroom/bathroom steps; and External: Side entrace has 3 STE Has following equipment at home: Vannie - 2 wheeled, Wheelchair (manual), Shower bench, bed side commode, and transport chair  PLOF: Independent with household mobility with device and Needs assistance with ADLs  PATIENT GOALS: to get stronger  OBJECTIVE:  Note: Objective measures were completed at Evaluation unless otherwise noted.  HAND DOMINANCE: Right  ADLs: Transfers/ambulation related to ADLs: Supervision - CGA with RW UB Dressing: Supervision/setup LB Dressing: Spouse completing due to foley catheter, wearing pullup disposable underwear Toileting: using foley catheter for urine, spouse assists with clothing management and hygiene for BM - spouse reports they have a bowel regimen/schedule  Bathing: Spouse  will assist him with bathing, currently at bed level due to foley catheter.  Prior to meningioma pt would still need assistance with washing LB in shower and washing hair Tub Shower transfers: not completing currently Equipment: Transfer tub bench and bed side commode   MOBILITY STATUS: Needs Assist: Supervision - CGA with RW  POSTURE COMMENTS:  rounded shoulders, forward head, and posterior pelvic tilt   ACTIVITY TOLERANCE: Activity tolerance: diminished  FUNCTIONAL OUTCOME MEASURES: PSFS: 0    UPPER EXTREMITY ROM:    Active ROM Right eval Left eval  Shoulder flexion ~80 ~80  Shoulder abduction    Shoulder adduction    Shoulder extension    Shoulder internal rotation 90% 90%  Shoulder external rotation 85% 85%  Elbow flexion    Elbow extension    Wrist flexion    Wrist extension    Wrist ulnar deviation    Wrist radial deviation    Wrist pronation    Wrist supination    (Blank rows = not tested)  UPPER EXTREMITY MMT:   RUE:3+/5 and LUE: 3-/5  COORDINATION: Finger Nose Finger test: slowed bilaterally, mild dysmetria Box and Blocks:  Right  34 blocks, Left 39 blocks  SENSATION: WFL   COGNITION: Overall cognitive status: History of cognitive impairments - at baseline Memory impairments, decreased STM, impaired problem solving  VISION: Subjective report: wears bifocals, has some intermittent diplopia at baseline Baseline vision: Bifocals  VISION ASSESSMENT: To be further assessed in functional context   OBSERVATIONS: Pt appearing unsure of his abilities and frequently looking to spouse for feedback and/or input. Spouse providing majority of pt history and focusing on history and growth of meningioma.  Pt's spouse also reports providing a lot of assistance with bathing and dressing due to foley catheter.                                                                                                                            TREATMENT DATE:  12/02/23 BP:  130/73 after ambulating ~20' to therapy room.  BP again after standing 5 mins 99/63.  After 2 mins seated rest BP: 93/63.  Pt with no reports of light headedness or dizziness.  OT provided pt with water and passed off to PT with recommendation to monitor BP. Self-care: pt reports that he is doing well with UB dressing.  Pt reports difficulty with LB dressing due to needing to have foley catheter threaded through pants.  Pt reports that parts of dressing continue to be a little bit more demanding.  OT educating on use of AE/DME to aid in LB dressing with use of reacher, long handled shoe horn, sock aid, and shoe funnel.  OT also demonstrating figure 4 position to aid in LB dressing for increased reach.   Bathing: pt reports that his spouse is washing him in the bed.  OT encouraging pt to complete UB bathing in bathroom whether in sitting or standing.  Discussion progression from bed level to sink side to shower as able/if desired.  OT educating on use of AE/DME to aid in LB bathing with demonstration of use of long handled sponge.   Standing tolerance: engaged in pipe tree puzzle in standing with focus on upright standing posture and standing tolerance.  Pt initially picking out PVC pipe pieces per pattern prior to standing to address sequencing while educating on energy conservation.  Pt then standing 2 mins to complete task with good sequencing.  OT increased challenge to standing while completing 2nd pattern without pre-picking pieces.  Pt demonstrating good ability to alternate attention between picture and pattern with 2 instances of asking where am I but able to locate and correct errors independently.  Pt tolerated standing 5 mins with good endurance and no reports of dizziness or light headedness - however BP did drop after standing activity.    11/27/23 Initial focus of session on patient and family therapeutic discussion and education - wife having many questions regarding activity and blood  pressure, wanting to fill in this therapist regarding recent hospitalization on 6/26 for hypotensive episode. Per spouse, pt is going to have an MRI scheduled but has not  been scheduled yet. BP measured throughout with the following readings: 93/61 sitting, 101/63 static standing, 116/67 after a short walk, and remained WNL throughout session.  Pt performing standing task with no UE support for 2+ minutes while doing card sorting task to address balance, posture, FMC/GMC and while doing a dual task for talking, alternating attention between table top task, and following commands.   11/21/23 SLUMS: completed with pt scoring 19/30, pt with difficulty with recall of objects and details from story.  Pt also benefiting from repetition of instructions as forgetting time for clock draw and amount of money in mental math scenario.  Pt also with decreased recall and/or spatial awareness with placing time on clock.   Dual tasking/attention: pt completing large peg board pattern replication while naming items based on color.  Pt demonstrating good attention to task and ability to recall change of color from pattern to pegs.  Pt utilizing written categories to increase recall, with good ability to alternate attention between cognitive and motor task.  Pt with mod difficulty with recall of items named before so as not to repeat self.   Memory strategies: OT educating on memory strategies and keeping thinking skills sharp.  Providing examples of each memory strategy and suggestions on various activities to aid in keeping thinking skills harp.  Pt provided with handouts (see pt instructions)   PATIENT EDUCATION: Education details: memory and thinking skills Person educated: Patient Education method: Explanation, Demonstration, Verbal cues, and Handouts Education comprehension: needs further education  HOME EXERCISE PROGRAM: TBD   GOALS: Goals reviewed with patient? Yes  SHORT TERM GOALS: Target date:  12/06/23  Pt and spouse will be independent in coordination and strengthening HEP with use of handouts. Baseline: new to OP OT Goal status: in progress  2.  Pt will verbalize understanding of adaptive techniques, task modifications, and/or potential DME/AE needs to increase ease, safety, and independence w/ ADLs Baseline: total assist LB dressing and bathing Goal status: in progress  3.  Pt will complete LB dressing with min assist with use of AE PRN. Baseline: total assist LB dressing 12/02/23 - educated on AE for LB dressing and recommendation of figure 4 as able.  Pt reports that his spouse is aiding in LB dressing due to foley catheter bag. Goal status: in progress  4.  Pt will complete LB bathing with min assist with use of AE PRN Baseline: total assist LB bathing 12/02/23 - educated on progression from bed bath to sitting EOB, to in bathroom.  Pt reports that his wife completes bathing for him because she was a nurse Goal status: in progress   LONG TERM GOALS: Target date: 12/27/23  Pt will demonstrate improved activity tolerance by completing standing activity for 10 mins to allow return to IADLs (pt would iron and wash breakfast dishes). Baseline: not engaging in any IADLs at this time Goal status: in progress  2.  Pt will be able to complete LB dressing with supervision/setup with use of AE PRN. Baseline: total assist LB dressing Goal status: in progress  3.  Pt will be able to complete LB bathing with supervision/setup with use of AE PRN. Baseline: total assist LB bathing Goal status: in progress  4.  Pt will demonstrate improved UE functional use for ADLs as evidenced by increasing box/ blocks score by 6 blocks with RUE and 3 blocks with LUE Baseline: R: 34 blocks, L: 39 blocks Goal status: in progress  5.  Patient will report at least two-point increase  in average PSFS score or at least three-point increase in a single activity score indicating functionally significant  improvement given minimum detectable change. Baseline: 0 Goal status: in progress   ASSESSMENT:  CLINICAL IMPRESSION: Patient is a 83 y.o. male who was seen today for occupational therapy treatment with focus on assessing blood pressure  during various activities to monitor for hypotensive episodes. Pt with BP within normal limits this date, however noting that it did drop towards end of session.  Pt educated on importance of increasing participation in bathing and dressing to increase endurance and activity tolerance.  Pt reports having a sock aid and a reacher at home, but that spouse is completing majority of bathing and LB dressing. Pt will continue to benefit from skilled occupational therapy services to address strength and coordination, ROM, pain management, balance, GM/FM control, cognition, safety awareness, introduction of compensatory strategies/AE prn, and implementation of an HEP to improve participation and safety during ADLs and IADLs.    PERFORMANCE DEFICITS: in functional skills including ADLs, IADLs, coordination, ROM, strength, pain, Fine motor control, Gross motor control, body mechanics, endurance, decreased knowledge of use of DME, and UE functional use, cognitive skills including attention, memory, problem solving, safety awareness, and sequencing, and psychosocial skills including coping strategies, environmental adaptation, and routines and behaviors.     PLAN:  OT FREQUENCY: 1-2x/week  OT DURATION: 6 weeks  PLANNED INTERVENTIONS: 97168 OT Re-evaluation, 97535 self care/ADL training, 02889 therapeutic exercise, 97530 therapeutic activity, 97112 neuromuscular re-education, functional mobility training, visual/perceptual remediation/compensation, psychosocial skills training, energy conservation, coping strategies training, patient/family education, and DME and/or AE instructions  RECOMMENDED OTHER SERVICES: Speech therapy  CONSULTED AND AGREED WITH PLAN OF CARE:  Patient and family member/caregiver  PLAN FOR NEXT SESSION: review memory strategies, initiate coordination HEP, shoulder ROM HEP, educate on AE/DME for LB bathing and dressing   Alekxander Isola, OTR/L 12/02/2023, 11:24 AM   [facility] [address] [place], [place] [address] Phone # [phone] Fax # [phone]

## 2023-12-04 ENCOUNTER — Ambulatory Visit

## 2023-12-04 ENCOUNTER — Ambulatory Visit: Admitting: Occupational Therapy

## 2023-12-04 DIAGNOSIS — R293 Abnormal posture: Secondary | ICD-10-CM

## 2023-12-04 DIAGNOSIS — I69818 Other symptoms and signs involving cognitive functions following other cerebrovascular disease: Secondary | ICD-10-CM

## 2023-12-04 DIAGNOSIS — R2681 Unsteadiness on feet: Secondary | ICD-10-CM

## 2023-12-04 DIAGNOSIS — M6281 Muscle weakness (generalized): Secondary | ICD-10-CM

## 2023-12-04 DIAGNOSIS — R262 Difficulty in walking, not elsewhere classified: Secondary | ICD-10-CM

## 2023-12-04 NOTE — Therapy (Signed)
 OUTPATIENT PHYSICAL THERAPY NEURO TREATMENT   Patient Name: Jesse [name] MRN: [id number] DOB:[birth year], 83 y.o., male Today's Date: 12/04/2023   PCP: [name] MATSU, MD REFERRING PROVIDER: [name] RAMAN, PA-C  END OF SESSION:  PT End of Session - 12/04/23 1019     Visit Number 5    Number of Visits 13    Date for PT Re-Evaluation 12/24/23    Authorization Type Medicare    Progress Note Due on Visit 10    PT Start Time 1015    PT Stop Time 1100    PT Time Calculation (min) 45 min    Activity Tolerance Patient tolerated treatment well    Behavior During Therapy [facility] for tasks assessed/performed            Past Medical History:  Diagnosis Date   Acute encephalopathy 09/30/2023   Arthritis    Atrial fibrillation (HCC)    Complication of anesthesia    Atrial fibrillation during surgery    Dementia (HCC)    Depression    Hard of hearing    Hx of constipation    Osteoporosis    DEXA 11/09/10. Completed treatemtn with Fosamax.   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/rectal tube 09/27/2017 09/27/2017   Past Surgical History:  Procedure Laterality Date   BACK SURGERY     COLONOSCOPY     CRANIOTOMY Left 10/03/2023   Procedure: CRANIOTOMY TUMOR EXCISION LEFT FRONTAL;  Surgeon: [name], MD;  Location: [facility] OR;  Service: Neurosurgery;  Laterality: Left;  Left Frontal Craniotomy for Menigioma   CRANIOTOMY N/A 10/04/2023   Procedure: CRANIOTOMY HEMATOMA EVACUATION SUBDURAL;  Surgeon: [name], MD;  Location: [facility] OR;  Service: Neurosurgery;  Laterality: N/A;  REPEAT   FLEXIBLE SIGMOIDOSCOPY N/A 09/27/2017   Procedure: FLEXIBLE SIGMOIDOSCOPY;  Surgeon: [name], MD;  Location: [facility] ENDOSCOPY;  Service: Endoscopy;  Laterality: N/A;   JOINT REPLACEMENT     LAMINECTOMY  10/2011   cervical and lumbar laminectomy   PROSTATE BIOPSY  07/10/2011   PROSTATE BIOPSY  09/22/15   TOTAL  HIP ARTHROPLASTY Right 02/29/2012   TOTAL HIP ARTHROPLASTY Right 07/04/2016   Procedure: RIGHT TOTAL HIP ARTHROPLASTY ANTERIOR APPROACH;  Surgeon: [name], MD;  Location: [facility] OR;  Service: Orthopedics;  Laterality: Right;   TRANSURETHRAL RESECTION OF PROSTATE     Patient Active Problem List   Diagnosis Date Noted   History of seizure 11/12/2023   History of colon resection 11/12/2023   Vitamin D  deficiency 11/12/2023   Impaired gait and mobility 11/04/2023   Drug-induced neutropenia (HCC) 10/29/2023   Foley catheter in place 10/28/2023   Urinary retention 10/28/2023   Orthostatic hypotension 10/15/2023   Status post resection of meningioma 10/10/2023   SDH (subdural hematoma) (HCC) 10/10/2023   Intracranial bleed (HCC) 09/30/2023   Brain mass 09/30/2023   Hyponatremia 09/30/2023   Hypokalemia 09/30/2023   Hypocalcemia 09/30/2023   Wound drainage 09/10/2022   Vitamin B12 deficiency 08/26/2020   Restless legs 07/22/2019   History of skin cancer 01/12/2019   Erectile dysfunction 01/12/2019   Anxiety 01/12/2019   Mild dementia (HCC) 09/27/2017   Chronic constipation 09/27/2017   Osteoarthritis of right hip 06/05/2016   Prostate cancer (HCC) 12/07/2015   Paroxysmal atrial fibrillation (HCC) 12/07/2015   Hypothyroidism 12/07/2015    ONSET DATE: 09/30/23  REFERRING DIAG: Z98.890,Z86.018 (ICD-10-CM) -  Status post resection of meningioma I62.9 (ICD-10-CM) - Intracranial bleed  THERAPY DIAG:  Unsteadiness on feet  Muscle weakness (generalized)  Other symptoms and signs involving cognitive functions following other cerebrovascular disease  Abnormal posture  Difficulty in walking, not elsewhere classified  Rationale for Evaluation and Treatment: Rehabilitation  SUBJECTIVE:                                                                                                                                                                                             SUBJECTIVE  STATEMENT: Doing ok, no new issues Pt accompanied by: self   PERTINENT HISTORY: 83 y.o. year old male  who  has a past medical history of Acute encephalopathy (09/30/2023), Arthritis, Atrial fibrillation (HCC), Complication of anesthesia, Dementia (HCC), Depression, Hard of hearing, constipation, Osteoporosis, Prostate cancer (HCC), Restless legs, Thyroid  disease, Vitiligo, and Volvulus of sigmoid colon s/p flex sig/rectal tube 09/27/2017 (09/27/2017)resection of left frontal brain tumor and for evacuation of SDH on 10/03/2023 with Dr. Colon, with revision and SDH evac 5/9 and subsequent 5/15-5/27/25 I[facility] stay  PAIN:  Are you having pain? No  PRECAUTIONS: Fall and Other: foley catheter  RED FLAGS: None   WEIGHT BEARING RESTRICTIONS: No  FALLS: Has patient fallen in last 6 months? Yes. Number of falls 2  LIVING ENVIRONMENT: Lives with: lives with their spouse Lives in: House/apartment Stairs: two level home Has following equipment at home: Environmental consultant - 2 wheeled  PLOF: Independent with household mobility with device and Independent with community mobility with device  PATIENT GOALS:   OBJECTIVE:   TODAY'S TREATMENT: 12/04/23 Activity Comments  Vitals: 97/57 mmHg, 50 bpm   Seated LE PRE 3# -LAQ 3x10 -hip add iso 3x10 -hamstring curls 3x10 grn -clamshells 3x10 grn  Vitals: 95/63 mmHg, 68 bpm   5xSTS test 22 sec  Gait w/ supervision x 500 ft W/ RW  Sit to stand 5x5 Seat height 24, green t-band pulling posteriorly  Foot advance to 4/8 step w/ bean bag toss 3# ankle weights  Alt stair taps x 2 min 3#, 8 box     OT reports that BP at end of their session was 93/63 mmHg.    TODAY'S TREATMENT: 12/02/23 Activity Comments  Nustep L3 x 7 min UEs/LEs  Maintaining 58SPM   sitting green TB clam 2x15 sitting march 2x20 3# sitting heel/toe raises 20x  sitting HS curl with green TB 10x each  LAQ 3# 15x each  Small amplitude movements with marches. Otherwise with good form and effort.  Difficulty achieving TKE with LAQ  Vitals  98/63 mmHg, 67bpm   Mini squat 10x At TM  rail; flexed trunk   Standing alt backwards steps  At TM rail; flexed trunk and crouched posture. B UE support   Step ups onto 6 step 10x CGA  Vitals upon leaving  106/62mmHg, 65bpm        HOME EXERCISE PROGRAM: Access Code: [email or web address] URL: [email or web address] Date: 11/27/2023 Prepared by: [facility] - Outpatient  Rehab - [facility]  Exercises - Seated Hip Abduction with Resistance  - 1 x daily - 5 x weekly - 2 sets - 15 reps - Seated March  - 1 x daily - 5 x weekly - 2 sets - 15 reps - Seated Heel Toe Raises  - 1 x daily - 5 x weekly - 2 sets - 15 reps - Seated Hamstring Curl with Anchored Resistance  - 1 x daily - 5 x weekly - 2 sets - 15 reps   --------------------------------------------------------  Note: Objective measures were completed at Evaluation unless otherwise noted.  Vitals: 98/67 mmHg, 66 bpm (sitting)  103/69 mmHg, 74 bpm  DIAGNOSTIC FINDINGS:   COGNITION: Overall cognitive status: History of cognitive impairments - at baseline   SENSATION: Not tested  COORDINATION: Rapid alternating movement WFL Heel to shin limited by hip ROM but good accuracy  EDEMA:  none  MUSCLE TONE: NT  MUSCLE LENGTH: Hamstrings: Right -10 deg; Left -10 deg   DTRs:  NT  POSTURE: rounded shoulders and forward head  LOWER EXTREMITY ROM:     Active  Right Eval Left Eval  Hip flexion    Hip extension    Hip abduction    Hip adduction    Hip internal rotation    Hip external rotation    Knee flexion    Knee extension -10 -10  Ankle dorsiflexion 10 15  Ankle plantarflexion    Ankle inversion    Ankle eversion     (Blank rows = not tested)  LOWER EXTREMITY MMT:    RLE 4/5  LLE 5/5  BED MOBILITY:  Independent    TRANSFERS: independent   CURB:  Findings: SBA-CGA w/ RW  STAIRS: SBA-CGA w/bilat HR and step-to GAIT: Findings: Distance walked:   , Level of assistance: Supervision, and Comments: decreased step length, decreased right foot clearance, increased double limb support  FUNCTIONAL TESTS:  5 times sit to stand: 26 Timed up and go (TUG): 24 sec w/ RW 10 meter walk test: 20.84 sec Dynamic Gait Index: 14/24                                                                                                                                  TREATMENT DATE:      GOALS: Goals reviewed with patient? Yes  SHORT TERM GOALS: Target date: 12/03/2023    Patient will perform HEP with family/caregiver supervision for improved strength, balance, transfers, and gait  Baseline: Goal status: MET  2.  Demo improved BLE strength and balance per time 20 sec 5xSTS test  Baseline: 26 sec; 22 sec Goal status: IN PROGRESS 12/04/23  3.  Modified independent ambulation level surfaces x 500 ft to improve functional independence Baseline: supervision x 375 ft w/ RW; supervision x 500 ft Goal status: IN PROGRESS 12/04/23  4.  Curb negotiation w/ modified independence to improve safety in community Baseline: SBA-CGA w/ RW; supervision level surfaces w/ RW Goal status: IN PROGRESS 12/04/23    LONG TERM GOALS: Target date: 12/24/2023    Reduce risk for falls per score 19/24 Dynamic Gait Index Baseline: 14/24 Goal status: IN PROGRESS  2.  Reduce risk for falls per time 14 sec TUG test Baseline: 24 sec w/ RW Goal status: IN PROGRESS  3.  Improve gait speed to 2.0 ft/sec to enhance efficiency community ambulation Baseline: 1.5 ft/sec Goal status: IN PROGRESS  4.  Modified independent stair ambulation to enable access to 2nd floor bedroom Baseline: SBA-CGA Goal status: IN PROGRESS    ASSESSMENT:  CLINICAL IMPRESSION: Continued low BP and bradycardia.  Session initiated with seated LE PRE for improved strength/activity tolerance with increase to repetitions tolerated very well and BP remaining stable with appropriate rise in HR.  Transfer  training against resistance to improve independence and success for transfers from lower/softer seat heights with good carryover to use of momentum for sit to stand. Standing balance activities to improve coordination and single limb support with multitask drill. Ended with timed stair taps with ankle weights to improve endurance. Review of STG w/ improved performance 5xSTS and increased ambulation endurance. Continued sessions to progress POC details  OBJECTIVE IMPAIRMENTS: Abnormal gait, decreased activity tolerance, decreased balance, decreased endurance, decreased knowledge of use of DME, decreased mobility, difficulty walking, decreased ROM, decreased strength, decreased safety awareness, dizziness, and postural dysfunction.   ACTIVITY LIMITATIONS: carrying, lifting, bending, standing, stairs, reach over head, and locomotion level  PARTICIPATION LIMITATIONS: meal prep, cleaning, laundry, interpersonal relationship, shopping, and community activity  PERSONAL FACTORS: Age, Time since onset of injury/illness/exacerbation, and 3+ comorbidities: PMH are also affecting patient's functional outcome.   REHAB POTENTIAL: Good  CLINICAL DECISION MAKING: Evolving/moderate complexity  EVALUATION COMPLEXITY: Moderate  PLAN:  PT FREQUENCY: 1-2x/week  PT DURATION: 6 weeks  PLANNED INTERVENTIONS: 97750- Physical Performance Testing, 97110-Therapeutic exercises, 97530- Therapeutic activity, [id number]- Neuromuscular re-education, 97535- Self Care, [id number]- Manual therapy, [id number]- Gait training, [phone]- Canalith repositioning, and [id number]- Aquatic Therapy  PLAN FOR NEXT SESSION: consider adding ankle weights to HEP for strength, balance, stair ambulation    11:07 AM, 12/04/23 M. Kelly Mohamud Mrozek, PT, DPT Physical Therapist- [place] Office Number: [phone]

## 2023-12-04 NOTE — Therapy (Signed)
 OUTPATIENT OCCUPATIONAL THERAPY NEURO  Treatment Note  Patient Name: Jesse [name] MRN: [id number] DOB:07-27-1940, 83 y.o., male Today's Date: 12/04/2023  PCP: [name] MATSU, MD REFERRING PROVIDER: [name] RAMAN, PA-C  END OF SESSION:  OT End of Session - 12/04/23 1246     Visit Number 5    Number of Visits 13    Date for OT Re-Evaluation 12/27/23    Authorization Type Medicare A&B/ BCBS 2025    OT Start Time 1104    OT Stop Time 1146    OT Time Calculation (min) 42 min    Activity Tolerance Patient tolerated treatment well    Behavior During Therapy [facility] for tasks assessed/performed             Past Medical History:  Diagnosis Date   Acute encephalopathy 09/30/2023   Arthritis    Atrial fibrillation (HCC)    Complication of anesthesia    Atrial fibrillation during surgery    Dementia (HCC)    Depression    Hard of hearing    Hx of constipation    Osteoporosis    DEXA 11/09/10. Completed treatemtn with Fosamax.   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/rectal tube 09/27/2017 09/27/2017   Past Surgical History:  Procedure Laterality Date   BACK SURGERY     COLONOSCOPY     CRANIOTOMY Left 10/03/2023   Procedure: CRANIOTOMY TUMOR EXCISION LEFT FRONTAL;  Surgeon: [name], MD;  Location: [facility] OR;  Service: Neurosurgery;  Laterality: Left;  Left Frontal Craniotomy for Menigioma   CRANIOTOMY N/A 10/04/2023   Procedure: CRANIOTOMY HEMATOMA EVACUATION SUBDURAL;  Surgeon: [name], MD;  Location: [facility] OR;  Service: Neurosurgery;  Laterality: N/A;  REPEAT   FLEXIBLE SIGMOIDOSCOPY N/A 09/27/2017   Procedure: FLEXIBLE SIGMOIDOSCOPY;  Surgeon: [name], MD;  Location: [facility];  Service: Endoscopy;  Laterality: N/A;   JOINT REPLACEMENT     LAMINECTOMY  10/2011   cervical and lumbar laminectomy   PROSTATE BIOPSY  07/10/2011   PROSTATE BIOPSY  09/22/15   TOTAL HIP  ARTHROPLASTY Right 02/29/2012   TOTAL HIP ARTHROPLASTY Right 07/04/2016   Procedure: RIGHT TOTAL HIP ARTHROPLASTY ANTERIOR APPROACH;  Surgeon: [name], MD;  Location: [facility] OR;  Service: Orthopedics;  Laterality: Right;   TRANSURETHRAL RESECTION OF PROSTATE     Patient Active Problem List   Diagnosis Date Noted   History of seizure 11/12/2023   History of colon resection 11/12/2023   Vitamin D  deficiency 11/12/2023   Impaired gait and mobility 11/04/2023   Drug-induced neutropenia (HCC) 10/29/2023   Foley catheter in place 10/28/2023   Urinary retention 10/28/2023   Orthostatic hypotension 10/15/2023   Status post resection of meningioma 10/10/2023   SDH (subdural hematoma) (HCC) 10/10/2023   Intracranial bleed (HCC) 09/30/2023   Brain mass 09/30/2023   Hyponatremia 09/30/2023   Hypokalemia 09/30/2023   Hypocalcemia 09/30/2023   Wound drainage 09/10/2022   Vitamin B12 deficiency 08/26/2020   Restless legs 07/22/2019   History of skin cancer 01/12/2019   Erectile dysfunction 01/12/2019   Anxiety 01/12/2019   Mild dementia (HCC) 09/27/2017   Chronic constipation 09/27/2017   Osteoarthritis of right hip 06/05/2016   Prostate cancer (HCC) 12/07/2015   Paroxysmal atrial fibrillation (HCC) 12/07/2015   Hypothyroidism 12/07/2015    ONSET DATE: referral date 10/16/23  REFERRING DIAG: Z98.890,Z86.018 (ICD-10-CM) - Status post  resection of meningioma I62.9 (ICD-10-CM) - Intracranial bleed  THERAPY DIAG:  Unsteadiness on feet  Muscle weakness (generalized)  Other symptoms and signs involving cognitive functions following other cerebrovascular disease  Rationale for Evaluation and Treatment: Rehabilitation  SUBJECTIVE:   SUBJECTIVE STATEMENT: Pt reports that his BP was a bit lower during PT session today.  Pt accompanied by: self and significant other  PERTINENT HISTORY: history of A fib, RLS, dementia, Prostate CA, meningioma dx 2016 (asymptomatic and had refused  excision) who was admitted to [facility] on 09/30/23 with one week history of progressive weakness with cognitive change and unwitnessed fall. He was found to have large heterogenous and hemorrhagic extra axial mass along left frontal convexity with 1.5 cm rightward midline shift and 7 mm subdural hemorrhage felt to be acute on chronic.He underwent crani for resection of left frontal brain tumor and for evacuation of SDH on 10/03/23. Post op course complicated by mental status changes with decrease in verbal out put and was found to have accumulation of bleed with mass effect. He received 2 units FFP and was taken back to OR on 05/09 for revision craniotomy and evacuation of SDH. Post op decadron  taper and Keppra  for seizure prophylaxis.  PRECAUTIONS: Fall and Other: L crani, Seizure, HOH, hx of dementia, orthostatic  WEIGHT BEARING RESTRICTIONS: No  PAIN:  Are you having pain? No  FALLS: Has patient fallen in last 6 months? Yes. Number of falls 2, 1 fall about a week prior to fall that took him to the hospital  LIVING ENVIRONMENT: Lives with: lives with their spouse Lives in: House/apartment Stairs: Yes: Internal: Two level, 1/2 bath on main level, Able to live on main level with bedroom/bathroom steps; and External: Side entrace has 3 STE Has following equipment at home: Vannie - 2 wheeled, Wheelchair (manual), Shower bench, bed side commode, and transport chair  PLOF: Independent with household mobility with device and Needs assistance with ADLs  PATIENT GOALS: to get stronger  OBJECTIVE:  Note: Objective measures were completed at Evaluation unless otherwise noted.  HAND DOMINANCE: Right  ADLs: Transfers/ambulation related to ADLs: Supervision - CGA with RW UB Dressing: Supervision/setup LB Dressing: Spouse completing due to foley catheter, wearing pullup disposable underwear Toileting: using foley catheter for urine, spouse assists with clothing management and hygiene for BM - spouse  reports they have a bowel regimen/schedule  Bathing: Spouse will assist him with bathing, currently at bed level due to foley catheter.  Prior to meningioma pt would still need assistance with washing LB in shower and washing hair Tub Shower transfers: not completing currently Equipment: Transfer tub bench and bed side commode   MOBILITY STATUS: Needs Assist: Supervision - CGA with RW  POSTURE COMMENTS:  rounded shoulders, forward head, and posterior pelvic tilt   ACTIVITY TOLERANCE: Activity tolerance: diminished  FUNCTIONAL OUTCOME MEASURES: PSFS: 0    UPPER EXTREMITY ROM:    Active ROM Right eval Left eval  Shoulder flexion ~80 ~80  Shoulder abduction    Shoulder adduction    Shoulder extension    Shoulder internal rotation 90% 90%  Shoulder external rotation 85% 85%  Elbow flexion    Elbow extension    Wrist flexion    Wrist extension    Wrist ulnar deviation    Wrist radial deviation    Wrist pronation    Wrist supination    (Blank rows = not tested)  UPPER EXTREMITY MMT:   RUE:3+/5 and LUE: 3-/5  COORDINATION: Finger Nose Finger test: slowed  bilaterally, mild dysmetria Box and Blocks:  Right 34 blocks, Left 39 blocks  SENSATION: WFL   COGNITION: Overall cognitive status: History of cognitive impairments - at baseline Memory impairments, decreased STM, impaired problem solving  VISION: Subjective report: wears bifocals, has some intermittent diplopia at baseline Baseline vision: Bifocals  VISION ASSESSMENT: To be further assessed in functional context   OBSERVATIONS: Pt appearing unsure of his abilities and frequently looking to spouse for feedback and/or input. Spouse providing majority of pt history and focusing on history and growth of meningioma.  Pt's spouse also reports providing a lot of assistance with bathing and dressing due to foley catheter.                                                                                                                             TREATMENT DATE:  12/04/23 Coordination: engaged in large peg board pattern replication with focus on use of RUE and LUE to challenge motor control and coordination.  Pt completing simple diagonal pattern with no coordination or sequencing issues.  OT increased challenge with completion of peg board pattern replication with use of key to identify correlating colors, challenging alternating attention, and LUE and RUE coordination and dexterity. Pt demonstrating good manipulation of pegs in-hand to place in peg board. Pt with increased challenge with sequencing of sporadic pattern, frequently stating where am I.  OT educated on use of bookmark for line follow to decrease errors with improvement with use.   Box and Blocks: Right: 44 blocks and Left: 45 blocks Standing tolerance: engaged in placing resistive clothespins onto vertical dowel with RUE and LUE, incorporating crossing midline and reaching outside BOS.  Pt tolerating standing 5 mins.  OT incorporated cognitive challenge with colored patterns with pt able to recall pattern. BP 94/65 and HR 62 in sitting.  Engaged in standing activity with BP 110/69 and HR 64 afterwards.    12/02/23 BP: 130/73 after ambulating ~20' to therapy room.  BP again after standing 5 mins 99/63.  After 2 mins seated rest BP: 93/63.  Pt with no reports of light headedness or dizziness.  OT provided pt with water and passed off to PT with recommendation to monitor BP. Self-care: pt reports that he is doing well with UB dressing.  Pt reports difficulty with LB dressing due to needing to have foley catheter threaded through pants.  Pt reports that parts of dressing continue to be a little bit more demanding.  OT educating on use of AE/DME to aid in LB dressing with use of reacher, long handled shoe horn, sock aid, and shoe funnel.  OT also demonstrating figure 4 position to aid in LB dressing for increased reach.   Bathing: pt reports that his spouse is  washing him in the bed.  OT encouraging pt to complete UB bathing in bathroom whether in sitting or standing.  Discussion progression from bed level to sink side to shower as able/if desired.  OT  educating on use of AE/DME to aid in LB bathing with demonstration of use of long handled sponge.   Standing tolerance: engaged in pipe tree puzzle in standing with focus on upright standing posture and standing tolerance.  Pt initially picking out PVC pipe pieces per pattern prior to standing to address sequencing while educating on energy conservation.  Pt then standing 2 mins to complete task with good sequencing.  OT increased challenge to standing while completing 2nd pattern without pre-picking pieces.  Pt demonstrating good ability to alternate attention between picture and pattern with 2 instances of asking where am I but able to locate and correct errors independently.  Pt tolerated standing 5 mins with good endurance and no reports of dizziness or light headedness - however BP did drop after standing activity.    11/27/23 Initial focus of session on patient and family therapeutic discussion and education - wife having many questions regarding activity and blood pressure, wanting to fill in this therapist regarding recent hospitalization on 6/26 for hypotensive episode. Per spouse, pt is going to have an MRI scheduled but has not been scheduled yet. BP measured throughout with the following readings: 93/61 sitting, 101/63 static standing, 116/67 after a short walk, and remained WNL throughout session.  Pt performing standing task with no UE support for 2+ minutes while doing card sorting task to address balance, posture, FMC/GMC and while doing a dual task for talking, alternating attention between table top task, and following commands.    PATIENT EDUCATION: Education details: memory and thinking skills Person educated: Patient Education method: Explanation, Demonstration, Verbal cues, and  Handouts Education comprehension: needs further education  HOME EXERCISE PROGRAM: TBD   GOALS: Goals reviewed with patient? Yes  SHORT TERM GOALS: Target date: 12/06/23  Pt and spouse will be independent in coordination and strengthening HEP with use of handouts. Baseline: new to OP OT Goal status: in progress  2.  Pt will verbalize understanding of adaptive techniques, task modifications, and/or potential DME/AE needs to increase ease, safety, and independence w/ ADLs Baseline: total assist LB dressing and bathing Goal status: in progress  3.  Pt will complete LB dressing with min assist with use of AE PRN. Baseline: total assist LB dressing 12/02/23 - educated on AE for LB dressing and recommendation of figure 4 as able.  Pt reports that his spouse is aiding in LB dressing due to foley catheter bag. Goal status: in progress  4.  Pt will complete LB bathing with min assist with use of AE PRN Baseline: total assist LB bathing 12/02/23 - educated on progression from bed bath to sitting EOB, to in bathroom.  Pt reports that his wife completes bathing for him because she was a nurse Goal status: in progress   LONG TERM GOALS: Target date: 12/27/23  Pt will demonstrate improved activity tolerance by completing standing activity for 10 mins to allow return to IADLs (pt would iron and wash breakfast dishes). Baseline: not engaging in any IADLs at this time Goal status: in progress  2.  Pt will be able to complete LB dressing with supervision/setup with use of AE PRN. Baseline: total assist LB dressing Goal status: in progress  3.  Pt will be able to complete LB bathing with supervision/setup with use of AE PRN. Baseline: total assist LB bathing Goal status: in progress  4.  Pt will demonstrate improved UE functional use for ADLs as evidenced by increasing box/ blocks score by 6 blocks with RUE and 3 blocks  with LUE Baseline: R: 34 blocks, L: 39 blocks 12/04/23: Right: 44 blocks and  Left: 45 blocks Goal status: in progress  5.  Patient will report at least two-point increase in average PSFS score or at least three-point increase in a single activity score indicating functionally significant improvement given minimum detectable change. Baseline: 0 Goal status: in progress   ASSESSMENT:  CLINICAL IMPRESSION: Patient is a 83 y.o. male who was seen today for occupational therapy treatment with focus on assessing blood pressure during various activities to monitor for hypotensive episodes. Pt with BP within normal limits this date, however noting that it was lower during seated activities compare to post standing.  Pt demonstrating improvements in GMC with clothespins and peg board activity.  Pt demonstrating mod difficulty with cognitive challenges during coordination tasks.  Pt will continue to benefit from skilled occupational therapy services to address strength and coordination, ROM, pain management, balance, GM/FM control, cognition, safety awareness, introduction of compensatory strategies/AE prn, and implementation of an HEP to improve participation and safety during ADLs and IADLs.    PERFORMANCE DEFICITS: in functional skills including ADLs, IADLs, coordination, ROM, strength, pain, Fine motor control, Gross motor control, body mechanics, endurance, decreased knowledge of use of DME, and UE functional use, cognitive skills including attention, memory, problem solving, safety awareness, and sequencing, and psychosocial skills including coping strategies, environmental adaptation, and routines and behaviors.     PLAN:  OT FREQUENCY: 1-2x/week  OT DURATION: 6 weeks  PLANNED INTERVENTIONS: 97168 OT Re-evaluation, 97535 self care/ADL training, 02889 therapeutic exercise, 97530 therapeutic activity, 97112 neuromuscular re-education, functional mobility training, visual/perceptual remediation/compensation, psychosocial skills training, energy conservation, coping  strategies training, patient/family education, and DME and/or AE instructions  RECOMMENDED OTHER SERVICES: Speech therapy  CONSULTED AND AGREED WITH PLAN OF CARE: Patient and family member/caregiver  PLAN FOR NEXT SESSION: review memory strategies, initiate coordination HEP, shoulder ROM HEP, educate on AE/DME for LB bathing and dressing   Mykayla Brinton, OTR/L 12/04/2023, 12:46 PM   [facility] at [facility] [address], [address] [place], [place] [address] Phone # [phone] Fax # [phone]

## 2023-12-09 ENCOUNTER — Ambulatory Visit: Admitting: Occupational Therapy

## 2023-12-09 ENCOUNTER — Ambulatory Visit

## 2023-12-11 ENCOUNTER — Ambulatory Visit

## 2023-12-11 ENCOUNTER — Ambulatory Visit: Admitting: Occupational Therapy

## 2023-12-13 ENCOUNTER — Ambulatory Visit: Admitting: Internal Medicine

## 2023-12-17 ENCOUNTER — Encounter: Admitting: Speech Pathology

## 2023-12-18 ENCOUNTER — Encounter: Admitting: Occupational Therapy

## 2023-12-18 ENCOUNTER — Ambulatory Visit

## 2023-12-18 ENCOUNTER — Ambulatory Visit: Admitting: Speech Pathology

## 2023-12-23 ENCOUNTER — Encounter: Admitting: Occupational Therapy

## 2023-12-23 ENCOUNTER — Ambulatory Visit

## 2023-12-25 ENCOUNTER — Encounter: Admitting: Occupational Therapy

## 2023-12-25 ENCOUNTER — Encounter

## 2023-12-25 ENCOUNTER — Ambulatory Visit: Admitting: Physical Therapy

## 2024-01-01 ENCOUNTER — Encounter

## 2024-01-08 ENCOUNTER — Encounter

## 2024-01-10 ENCOUNTER — Encounter (HOSPITAL_COMMUNITY): Payer: Self-pay

## 2024-01-10 ENCOUNTER — Emergency Department (HOSPITAL_COMMUNITY)
Admission: EM | Admit: 2024-01-10 | Discharge: 2024-01-11 | Discharge status: Against Medical Advice | Attending: Emergency Medicine | Admitting: Emergency Medicine

## 2024-01-10 ENCOUNTER — Other Ambulatory Visit: Payer: Self-pay

## 2024-01-10 DIAGNOSIS — Y846 Urinary catheterization as the cause of abnormal reaction of the patient, or of later complication, without mention of misadventure at the time of the procedure: Secondary | ICD-10-CM | POA: Insufficient documentation

## 2024-01-10 DIAGNOSIS — T83031A Leakage of indwelling urethral catheter, initial encounter: Secondary | ICD-10-CM | POA: Diagnosis present

## 2024-01-10 DIAGNOSIS — Z5321 Procedure and treatment not carried out due to patient leaving prior to being seen by health care provider: Secondary | ICD-10-CM | POA: Diagnosis not present

## 2024-01-10 LAB — CBC WITH DIFFERENTIAL/PLATELET
Abs Immature Granulocytes: 0.01 K/uL (ref 0.00–0.07)
Basophils Absolute: 0 K/uL (ref 0.0–0.1)
Basophils Relative: 1 %
Eosinophils Absolute: 0.2 K/uL (ref 0.0–0.5)
Eosinophils Relative: 3 %
HCT: 35.6 % — ABNORMAL LOW (ref 39.0–52.0)
Hemoglobin: 11.2 g/dL — ABNORMAL LOW (ref 13.0–17.0)
Immature Granulocytes: 0 %
Lymphocytes Relative: 17 %
Lymphs Abs: 0.9 K/uL (ref 0.7–4.0)
MCH: 25.7 pg — ABNORMAL LOW (ref 26.0–34.0)
MCHC: 31.5 g/dL (ref 30.0–36.0)
MCV: 81.7 fL (ref 80.0–100.0)
Monocytes Absolute: 0.9 K/uL (ref 0.1–1.0)
Monocytes Relative: 17 %
Neutro Abs: 3.2 K/uL (ref 1.7–7.7)
Neutrophils Relative %: 62 %
Platelets: 220 K/uL (ref 150–400)
RBC: 4.36 MIL/uL (ref 4.22–5.81)
RDW: 15.4 % (ref 11.5–15.5)
WBC: 5.1 K/uL (ref 4.0–10.5)
nRBC: 0 % (ref 0.0–0.2)

## 2024-01-10 LAB — URINALYSIS, ROUTINE W REFLEX MICROSCOPIC
Bilirubin Urine: NEGATIVE
Glucose, UA: NEGATIVE mg/dL
Ketones, ur: NEGATIVE mg/dL
Nitrite: NEGATIVE
Protein, ur: 100 mg/dL — AB
RBC / HPF: >50 RBC/hpf (ref 0–5)
Specific Gravity, Urine: 1.015 (ref 1.005–1.030)
WBC, UA: >50 WBC/hpf (ref 0–5)
pH: 5 (ref 5.0–8.0)

## 2024-01-10 LAB — COMPREHENSIVE METABOLIC PANEL WITH GFR
ALT: 10 U/L (ref 0–44)
AST: 21 U/L (ref 15–41)
Albumin: 3.4 g/dL — ABNORMAL LOW (ref 3.5–5.0)
Alkaline Phosphatase: 83 U/L (ref 38–126)
Anion gap: 9 (ref 5–15)
BUN: 18 mg/dL (ref 8–23)
CO2: 23 mmol/L (ref 22–32)
Calcium: 8.7 mg/dL — ABNORMAL LOW (ref 8.9–10.3)
Chloride: 104 mmol/L (ref 98–111)
Creatinine, Ser: 0.94 mg/dL (ref 0.61–1.24)
GFR, Estimated: >60 mL/min (ref >60)
Glucose, Bld: 93 mg/dL (ref 70–99)
Potassium: 4.3 mmol/L (ref 3.5–5.1)
Sodium: 136 mmol/L (ref 135–145)
Total Bilirubin: 0.5 mg/dL (ref 0.0–1.2)
Total Protein: 6.3 g/dL — ABNORMAL LOW (ref 6.5–8.1)

## 2024-01-10 NOTE — ED Triage Notes (Signed)
 The patient reports his foley catheter bag is leaking. He isnt sure if the bag is leaking or if the leaking is from the actual catheter itself. Denies any other complaints. Foley was placed 8/2, urine appears yellow.

## 2024-01-10 NOTE — ED Notes (Signed)
 New catheter placed, fresh UA sent to lab

## 2024-01-11 NOTE — ED Notes (Signed)
 Patient stated they where leaving, they did not want to stay any longer.

## 2024-01-15 ENCOUNTER — Encounter

## 2024-01-22 ENCOUNTER — Encounter

## 2024-02-03 ENCOUNTER — Telehealth: Admitting: Physical Medicine and Rehabilitation
